# Patient Record
Sex: Female | Born: 1940
Health system: Southern US, Community
[De-identification: ages and names within clinical notes are randomized; demographics above are authoritative.]

## PROBLEM LIST (undated history)

## (undated) DIAGNOSIS — Z5189 Encounter for other specified aftercare: Secondary | ICD-10-CM

## (undated) DIAGNOSIS — T148XXA Other injury of unspecified body region, initial encounter: Secondary | ICD-10-CM

## (undated) DIAGNOSIS — I00 Rheumatic fever without heart involvement: Secondary | ICD-10-CM

## (undated) DIAGNOSIS — M545 Low back pain, unspecified: Secondary | ICD-10-CM

## (undated) DIAGNOSIS — I4891 Unspecified atrial fibrillation: Secondary | ICD-10-CM

## (undated) DIAGNOSIS — H259 Unspecified age-related cataract: Secondary | ICD-10-CM

## (undated) DIAGNOSIS — Z7901 Long term (current) use of anticoagulants: Secondary | ICD-10-CM

## (undated) DIAGNOSIS — I639 Cerebral infarction, unspecified: Secondary | ICD-10-CM

## (undated) DIAGNOSIS — K1370 Unspecified lesions of oral mucosa: Secondary | ICD-10-CM

## (undated) DIAGNOSIS — M79606 Pain in leg, unspecified: Secondary | ICD-10-CM

## (undated) DIAGNOSIS — K219 Gastro-esophageal reflux disease without esophagitis: Secondary | ICD-10-CM

## (undated) DIAGNOSIS — IMO0001 Reserved for inherently not codable concepts without codable children: Secondary | ICD-10-CM

## (undated) DIAGNOSIS — R202 Paresthesia of skin: Secondary | ICD-10-CM

## (undated) DIAGNOSIS — K5790 Diverticulosis of intestine, part unspecified, without perforation or abscess without bleeding: Secondary | ICD-10-CM

## (undated) DIAGNOSIS — I509 Heart failure, unspecified: Secondary | ICD-10-CM

## (undated) DIAGNOSIS — Z952 Presence of prosthetic heart valve: Secondary | ICD-10-CM

## (undated) DIAGNOSIS — I10 Essential (primary) hypertension: Secondary | ICD-10-CM

## (undated) HISTORY — DX: Cerebral infarction, unspecified: I63.9

## (undated) HISTORY — DX: Unspecified lesions of oral mucosa: K13.70

## (undated) HISTORY — DX: Long term (current) use of anticoagulants: Z79.01

## (undated) HISTORY — DX: Low back pain, unspecified: M54.50

## (undated) HISTORY — DX: Other injury of unspecified body region, initial encounter: T14.8XXA

## (undated) HISTORY — DX: Pain in leg, unspecified: M79.606

## (undated) HISTORY — DX: Paresthesia of skin: R20.2

## (undated) HISTORY — PX: ABDOMINAL HYSTERECTOMY: SHX81

## (undated) HISTORY — DX: Diverticulosis of intestine, part unspecified, without perforation or abscess without bleeding: K57.90

## (undated) HISTORY — DX: Essential (primary) hypertension: I10

## (undated) HISTORY — DX: Encounter for other specified aftercare: Z51.89

## (undated) HISTORY — PX: CHOLECYSTECTOMY: SHX55

## (undated) HISTORY — DX: Unspecified atrial fibrillation: I48.91

## (undated) HISTORY — DX: Low back pain: M54.5

## (undated) HISTORY — DX: Rheumatic fever without heart involvement: I00

## (undated) HISTORY — DX: Unspecified age-related cataract: H25.9

## (undated) HISTORY — DX: Gastro-esophageal reflux disease without esophagitis: K21.9

## (undated) HISTORY — DX: Presence of prosthetic heart valve: Z95.2

## (undated) HISTORY — DX: Reserved for inherently not codable concepts without codable children: IMO0001

## (undated) HISTORY — DX: Heart failure, unspecified: I50.9

---

## 1993-10-28 HISTORY — PX: CARDIAC VALVE REPLACEMENT: SHX585

## 2000-05-29 ENCOUNTER — Ambulatory Visit (HOSPITAL_COMMUNITY): Admission: RE | Admit: 2000-05-29 | Discharge: 2000-05-29 | Payer: Self-pay | Admitting: Internal Medicine

## 2000-05-29 ENCOUNTER — Encounter: Payer: Self-pay | Admitting: Internal Medicine

## 2000-11-19 ENCOUNTER — Encounter: Payer: Self-pay | Admitting: Emergency Medicine

## 2000-11-19 ENCOUNTER — Emergency Department (HOSPITAL_COMMUNITY): Admission: EM | Admit: 2000-11-19 | Discharge: 2000-11-19 | Payer: Self-pay

## 2001-01-01 ENCOUNTER — Encounter: Payer: Self-pay | Admitting: Internal Medicine

## 2001-01-01 ENCOUNTER — Inpatient Hospital Stay (HOSPITAL_COMMUNITY): Admission: EM | Admit: 2001-01-01 | Discharge: 2001-01-02 | Payer: Self-pay | Admitting: Internal Medicine

## 2001-02-05 ENCOUNTER — Ambulatory Visit (HOSPITAL_BASED_OUTPATIENT_CLINIC_OR_DEPARTMENT_OTHER): Admission: RE | Admit: 2001-02-05 | Discharge: 2001-02-05 | Payer: Self-pay | Admitting: Critical Care Medicine

## 2001-03-03 ENCOUNTER — Ambulatory Visit (HOSPITAL_BASED_OUTPATIENT_CLINIC_OR_DEPARTMENT_OTHER): Admission: RE | Admit: 2001-03-03 | Discharge: 2001-03-03 | Payer: Self-pay | Admitting: Critical Care Medicine

## 2001-08-04 ENCOUNTER — Encounter: Payer: Self-pay | Admitting: Obstetrics

## 2001-08-04 ENCOUNTER — Encounter: Admission: RE | Admit: 2001-08-04 | Discharge: 2001-08-04 | Payer: Self-pay | Admitting: Obstetrics

## 2003-07-20 ENCOUNTER — Ambulatory Visit (HOSPITAL_COMMUNITY): Admission: RE | Admit: 2003-07-20 | Discharge: 2003-07-20 | Payer: Self-pay | Admitting: Obstetrics

## 2003-07-20 ENCOUNTER — Encounter: Payer: Self-pay | Admitting: Obstetrics

## 2004-04-29 ENCOUNTER — Emergency Department (HOSPITAL_COMMUNITY): Admission: EM | Admit: 2004-04-29 | Discharge: 2004-04-29 | Payer: Self-pay | Admitting: Emergency Medicine

## 2004-07-31 ENCOUNTER — Ambulatory Visit (HOSPITAL_BASED_OUTPATIENT_CLINIC_OR_DEPARTMENT_OTHER): Admission: RE | Admit: 2004-07-31 | Discharge: 2004-07-31 | Payer: Self-pay | Admitting: Otolaryngology

## 2004-10-04 ENCOUNTER — Ambulatory Visit: Payer: Self-pay | Admitting: Internal Medicine

## 2004-10-16 ENCOUNTER — Ambulatory Visit: Payer: Self-pay | Admitting: Family Medicine

## 2004-10-17 ENCOUNTER — Ambulatory Visit: Payer: Self-pay | Admitting: Family Medicine

## 2004-10-31 ENCOUNTER — Ambulatory Visit: Payer: Self-pay | Admitting: Family Medicine

## 2004-11-16 ENCOUNTER — Ambulatory Visit: Payer: Self-pay | Admitting: Family Medicine

## 2004-11-26 ENCOUNTER — Ambulatory Visit: Payer: Self-pay | Admitting: Family Medicine

## 2004-12-10 ENCOUNTER — Ambulatory Visit: Payer: Self-pay | Admitting: Family Medicine

## 2004-12-24 ENCOUNTER — Ambulatory Visit: Payer: Self-pay | Admitting: Family Medicine

## 2005-01-01 ENCOUNTER — Ambulatory Visit: Payer: Self-pay | Admitting: Family Medicine

## 2005-01-15 ENCOUNTER — Ambulatory Visit: Payer: Self-pay | Admitting: Family Medicine

## 2005-01-21 ENCOUNTER — Ambulatory Visit: Payer: Self-pay | Admitting: Cardiovascular Disease

## 2005-01-28 ENCOUNTER — Ambulatory Visit: Payer: Self-pay | Admitting: Family Medicine

## 2005-02-18 ENCOUNTER — Ambulatory Visit: Payer: Self-pay | Admitting: Family Medicine

## 2005-02-26 ENCOUNTER — Ambulatory Visit: Payer: Self-pay | Admitting: Family Medicine

## 2005-03-12 ENCOUNTER — Ambulatory Visit: Payer: Self-pay | Admitting: Family Medicine

## 2005-03-27 ENCOUNTER — Ambulatory Visit: Payer: Self-pay | Admitting: Family Medicine

## 2005-04-24 ENCOUNTER — Ambulatory Visit: Payer: Self-pay | Admitting: Family Medicine

## 2005-05-09 ENCOUNTER — Ambulatory Visit: Payer: Self-pay | Admitting: Family Medicine

## 2005-05-23 ENCOUNTER — Ambulatory Visit: Payer: Self-pay | Admitting: Family Medicine

## 2005-05-31 ENCOUNTER — Ambulatory Visit: Payer: Self-pay | Admitting: Family Medicine

## 2005-06-06 ENCOUNTER — Ambulatory Visit: Payer: Self-pay | Admitting: Family Medicine

## 2005-06-19 ENCOUNTER — Ambulatory Visit: Payer: Self-pay | Admitting: Family Medicine

## 2005-07-03 ENCOUNTER — Ambulatory Visit: Payer: Self-pay | Admitting: Family Medicine

## 2005-07-18 ENCOUNTER — Ambulatory Visit: Payer: Self-pay | Admitting: Family Medicine

## 2005-07-31 ENCOUNTER — Ambulatory Visit: Payer: Self-pay | Admitting: Family Medicine

## 2005-08-14 ENCOUNTER — Ambulatory Visit: Payer: Self-pay | Admitting: Family Medicine

## 2005-08-29 ENCOUNTER — Ambulatory Visit: Payer: Self-pay | Admitting: Family Medicine

## 2005-09-02 ENCOUNTER — Ambulatory Visit: Payer: Self-pay | Admitting: Family Medicine

## 2005-09-26 ENCOUNTER — Ambulatory Visit: Payer: Self-pay | Admitting: Family Medicine

## 2005-10-24 ENCOUNTER — Ambulatory Visit: Payer: Self-pay | Admitting: Family Medicine

## 2005-11-21 ENCOUNTER — Ambulatory Visit: Payer: Self-pay | Admitting: Family Medicine

## 2005-12-05 ENCOUNTER — Ambulatory Visit: Payer: Self-pay | Admitting: Family Medicine

## 2005-12-19 ENCOUNTER — Ambulatory Visit: Payer: Self-pay | Admitting: Family Medicine

## 2006-01-20 ENCOUNTER — Ambulatory Visit: Payer: Self-pay | Admitting: Family Medicine

## 2006-01-28 ENCOUNTER — Ambulatory Visit: Payer: Self-pay | Admitting: Family Medicine

## 2006-02-17 ENCOUNTER — Ambulatory Visit: Payer: Self-pay | Admitting: Family Medicine

## 2006-02-21 ENCOUNTER — Ambulatory Visit: Payer: Self-pay | Admitting: Family Medicine

## 2006-02-24 ENCOUNTER — Ambulatory Visit: Payer: Self-pay | Admitting: Family Medicine

## 2006-03-19 ENCOUNTER — Ambulatory Visit: Payer: Self-pay | Admitting: Family Medicine

## 2006-03-27 ENCOUNTER — Ambulatory Visit: Payer: Self-pay | Admitting: Family Medicine

## 2006-04-07 ENCOUNTER — Ambulatory Visit: Payer: Self-pay | Admitting: Family Medicine

## 2006-04-14 ENCOUNTER — Ambulatory Visit: Payer: Self-pay | Admitting: Family Medicine

## 2006-04-29 ENCOUNTER — Ambulatory Visit: Payer: Self-pay | Admitting: Family Medicine

## 2006-05-21 ENCOUNTER — Ambulatory Visit: Payer: Self-pay | Admitting: Family Medicine

## 2006-06-18 ENCOUNTER — Ambulatory Visit: Payer: Self-pay | Admitting: Family Medicine

## 2006-06-20 ENCOUNTER — Ambulatory Visit: Payer: Self-pay | Admitting: Family Medicine

## 2006-06-26 ENCOUNTER — Ambulatory Visit: Payer: Self-pay | Admitting: Family Medicine

## 2006-07-09 ENCOUNTER — Ambulatory Visit: Payer: Self-pay | Admitting: Family Medicine

## 2006-09-10 ENCOUNTER — Ambulatory Visit: Payer: Self-pay | Admitting: Family Medicine

## 2006-09-22 ENCOUNTER — Ambulatory Visit: Payer: Self-pay | Admitting: Cardiovascular Disease

## 2006-10-03 ENCOUNTER — Ambulatory Visit: Payer: Self-pay | Admitting: Internal Medicine

## 2006-10-03 LAB — CONVERTED CEMR LAB
Bacteria, U Microscopic: NEGATIVE /hpf
Basophils Relative: 0.5 % (ref 0.0–1.0)
Bilirubin Urine: NEGATIVE
Eosinophil percent: 1.2 % (ref 0.0–5.0)
Glucose, Bld: 110 mg/dL — ABNORMAL HIGH (ref 70–99)
Hemoglobin: 13 g/dL (ref 12.0–15.0)
Lymphocytes Relative: 28.6 % (ref 12.0–46.0)
Monocytes Absolute: 0.7 10*3/uL (ref 0.2–0.7)
Monocytes Relative: 10.8 % (ref 3.0–11.0)
Neutro Abs: 3.6 10*3/uL (ref 1.4–7.7)
Nitrite: NEGATIVE
Potassium: 4.3 meq/L (ref 3.5–5.1)
RDW: 13.7 % (ref 11.5–14.6)
Sodium: 139 meq/L (ref 135–145)
Specific Gravity, Urine: 1.01 (ref 1.000–1.03)
TSH: 3.15 microintl units/mL (ref 0.35–5.50)
Urine Glucose: NEGATIVE mg/dL
WBC: 6.2 10*3/uL (ref 4.5–10.5)
pH: 5.5 (ref 5.0–8.0)

## 2006-10-24 ENCOUNTER — Ambulatory Visit: Payer: Self-pay

## 2006-10-24 ENCOUNTER — Ambulatory Visit: Payer: Self-pay | Admitting: Cardiology

## 2006-11-25 ENCOUNTER — Ambulatory Visit: Payer: Self-pay | Admitting: Cardiology

## 2006-12-22 ENCOUNTER — Ambulatory Visit: Payer: Self-pay | Admitting: Cardiology

## 2007-01-19 ENCOUNTER — Ambulatory Visit: Payer: Self-pay | Admitting: Cardiology

## 2007-01-26 ENCOUNTER — Emergency Department (HOSPITAL_COMMUNITY): Admission: EM | Admit: 2007-01-26 | Discharge: 2007-01-26 | Payer: Self-pay | Admitting: Emergency Medicine

## 2007-03-02 ENCOUNTER — Ambulatory Visit: Payer: Self-pay | Admitting: Cardiovascular Disease

## 2007-03-02 LAB — CONVERTED CEMR LAB: INR: 2.6 — ABNORMAL HIGH (ref 0.9–2.0)

## 2007-04-01 ENCOUNTER — Ambulatory Visit: Payer: Self-pay | Admitting: Cardiology

## 2007-04-06 ENCOUNTER — Ambulatory Visit: Payer: Self-pay | Admitting: Internal Medicine

## 2007-04-27 ENCOUNTER — Ambulatory Visit: Payer: Self-pay | Admitting: Internal Medicine

## 2007-04-28 LAB — CONVERTED CEMR LAB
Albumin: 3.5 g/dL (ref 3.5–5.2)
Alkaline Phosphatase: 73 units/L (ref 39–117)
BUN: 10 mg/dL (ref 6–23)
Basophils Absolute: 0.1 10*3/uL (ref 0.0–0.1)
Basophils Relative: 1.4 % — ABNORMAL HIGH (ref 0.0–1.0)
Creatinine, Ser: 0.9 mg/dL (ref 0.4–1.2)
Crystals: NEGATIVE
GFR calc Af Amer: 80 mL/min
GFR calc non Af Amer: 66 mL/min
Hgb A1c MFr Bld: 6.8 % — ABNORMAL HIGH (ref 4.6–6.0)
INR: 6.1 (ref 0.9–2.0)
LDL Cholesterol: 59 mg/dL (ref 0–99)
Monocytes Relative: 12.7 % — ABNORMAL HIGH (ref 3.0–11.0)
Neutro Abs: 2.2 10*3/uL (ref 1.4–7.7)
Platelets: 306 10*3/uL (ref 150–400)
Potassium: 4.5 meq/L (ref 3.5–5.1)
Prothrombin Time: 32.2 s (ref 10.0–14.0)
RDW: 14.2 % (ref 11.5–14.6)
Sed Rate: 19 mm/hr (ref 0–25)
Sodium: 142 meq/L (ref 135–145)
Specific Gravity, Urine: 1.025 (ref 1.000–1.03)
TSH: 2.61 microintl units/mL (ref 0.35–5.50)
Total Bilirubin: 0.6 mg/dL (ref 0.3–1.2)
Total CHOL/HDL Ratio: 3.6
Urine Glucose: NEGATIVE mg/dL
Urobilinogen, UA: 0.2 (ref 0.0–1.0)
Vitamin B-12: 499 pg/mL (ref 211–911)
pH: 5.5 (ref 5.0–8.0)

## 2007-05-07 ENCOUNTER — Ambulatory Visit: Payer: Self-pay | Admitting: Internal Medicine

## 2007-05-21 ENCOUNTER — Ambulatory Visit (HOSPITAL_COMMUNITY): Admission: RE | Admit: 2007-05-21 | Discharge: 2007-05-21 | Payer: Self-pay | Admitting: Obstetrics

## 2007-07-02 ENCOUNTER — Ambulatory Visit: Payer: Self-pay | Admitting: Internal Medicine

## 2007-07-02 LAB — CONVERTED CEMR LAB
INR: 2.5 — ABNORMAL HIGH (ref 0.8–1.0)
Prothrombin Time: 20 s — ABNORMAL HIGH (ref 10.9–13.3)

## 2007-07-08 ENCOUNTER — Ambulatory Visit: Payer: Self-pay | Admitting: Internal Medicine

## 2007-07-31 ENCOUNTER — Ambulatory Visit: Payer: Self-pay | Admitting: Internal Medicine

## 2007-09-10 ENCOUNTER — Ambulatory Visit: Payer: Self-pay | Admitting: Internal Medicine

## 2007-10-05 ENCOUNTER — Ambulatory Visit: Payer: Self-pay | Admitting: Cardiovascular Disease

## 2007-10-05 ENCOUNTER — Ambulatory Visit: Payer: Self-pay | Admitting: Internal Medicine

## 2007-10-12 ENCOUNTER — Encounter: Payer: Self-pay | Admitting: Cardiovascular Disease

## 2007-10-12 ENCOUNTER — Ambulatory Visit: Payer: Self-pay

## 2007-10-12 ENCOUNTER — Ambulatory Visit: Payer: Self-pay | Admitting: Internal Medicine

## 2007-10-14 ENCOUNTER — Ambulatory Visit: Payer: Self-pay | Admitting: Internal Medicine

## 2007-10-14 DIAGNOSIS — E559 Vitamin D deficiency, unspecified: Secondary | ICD-10-CM | POA: Insufficient documentation

## 2007-10-14 DIAGNOSIS — I4891 Unspecified atrial fibrillation: Secondary | ICD-10-CM | POA: Insufficient documentation

## 2007-10-14 DIAGNOSIS — E118 Type 2 diabetes mellitus with unspecified complications: Secondary | ICD-10-CM | POA: Insufficient documentation

## 2007-10-14 DIAGNOSIS — M545 Low back pain, unspecified: Secondary | ICD-10-CM | POA: Insufficient documentation

## 2007-10-14 DIAGNOSIS — K219 Gastro-esophageal reflux disease without esophagitis: Secondary | ICD-10-CM | POA: Insufficient documentation

## 2007-10-14 DIAGNOSIS — E119 Type 2 diabetes mellitus without complications: Secondary | ICD-10-CM | POA: Insufficient documentation

## 2007-10-15 ENCOUNTER — Encounter: Payer: Self-pay | Admitting: Internal Medicine

## 2007-10-30 ENCOUNTER — Telehealth: Payer: Self-pay | Admitting: Family Medicine

## 2007-11-02 ENCOUNTER — Ambulatory Visit: Payer: Self-pay | Admitting: Cardiology

## 2007-11-23 ENCOUNTER — Ambulatory Visit: Payer: Self-pay | Admitting: Cardiology

## 2007-12-07 ENCOUNTER — Ambulatory Visit: Payer: Self-pay | Admitting: Cardiology

## 2007-12-16 ENCOUNTER — Encounter: Payer: Self-pay | Admitting: Internal Medicine

## 2007-12-31 ENCOUNTER — Ambulatory Visit: Payer: Self-pay | Admitting: Cardiovascular Disease

## 2008-01-19 ENCOUNTER — Ambulatory Visit: Payer: Self-pay | Admitting: Cardiology

## 2008-02-15 ENCOUNTER — Ambulatory Visit: Payer: Self-pay | Admitting: Internal Medicine

## 2008-02-15 DIAGNOSIS — J309 Allergic rhinitis, unspecified: Secondary | ICD-10-CM | POA: Insufficient documentation

## 2008-02-15 DIAGNOSIS — R61 Generalized hyperhidrosis: Secondary | ICD-10-CM | POA: Insufficient documentation

## 2008-02-15 LAB — CONVERTED CEMR LAB
BUN: 18 mg/dL (ref 6–23)
Chloride: 106 meq/L (ref 96–112)
GFR calc Af Amer: 71 mL/min
Glucose, Bld: 147 mg/dL — ABNORMAL HIGH (ref 70–99)
Potassium: 4 meq/L (ref 3.5–5.1)
Sodium: 139 meq/L (ref 135–145)
Vit D, 1,25-Dihydroxy: 33 (ref 30–89)

## 2008-03-14 ENCOUNTER — Ambulatory Visit: Payer: Self-pay | Admitting: Cardiology

## 2008-04-12 ENCOUNTER — Ambulatory Visit: Payer: Self-pay | Admitting: Cardiology

## 2008-04-20 ENCOUNTER — Ambulatory Visit: Payer: Self-pay | Admitting: Internal Medicine

## 2008-04-25 ENCOUNTER — Ambulatory Visit: Payer: Self-pay | Admitting: Cardiology

## 2008-04-25 ENCOUNTER — Ambulatory Visit: Payer: Self-pay | Admitting: Cardiovascular Disease

## 2008-05-16 ENCOUNTER — Encounter: Payer: Self-pay | Admitting: Internal Medicine

## 2008-06-01 ENCOUNTER — Ambulatory Visit: Payer: Self-pay | Admitting: Cardiology

## 2008-06-28 ENCOUNTER — Ambulatory Visit: Payer: Self-pay | Admitting: Internal Medicine

## 2008-06-28 DIAGNOSIS — K1370 Unspecified lesions of oral mucosa: Secondary | ICD-10-CM | POA: Insufficient documentation

## 2008-06-28 DIAGNOSIS — M79609 Pain in unspecified limb: Secondary | ICD-10-CM | POA: Insufficient documentation

## 2008-06-28 DIAGNOSIS — H259 Unspecified age-related cataract: Secondary | ICD-10-CM | POA: Insufficient documentation

## 2008-06-29 ENCOUNTER — Ambulatory Visit: Payer: Self-pay | Admitting: Internal Medicine

## 2008-07-20 ENCOUNTER — Ambulatory Visit: Payer: Self-pay | Admitting: Cardiology

## 2008-07-28 ENCOUNTER — Ambulatory Visit: Payer: Self-pay | Admitting: Internal Medicine

## 2008-08-11 ENCOUNTER — Ambulatory Visit: Payer: Self-pay | Admitting: Internal Medicine

## 2008-08-22 ENCOUNTER — Ambulatory Visit: Payer: Self-pay | Admitting: Cardiology

## 2008-09-05 ENCOUNTER — Ambulatory Visit: Payer: Self-pay | Admitting: Cardiovascular Disease

## 2008-09-19 ENCOUNTER — Ambulatory Visit: Payer: Self-pay | Admitting: Internal Medicine

## 2008-10-03 ENCOUNTER — Ambulatory Visit: Payer: Self-pay | Admitting: Internal Medicine

## 2008-10-10 ENCOUNTER — Ambulatory Visit: Payer: Self-pay | Admitting: Internal Medicine

## 2008-10-10 LAB — CONVERTED CEMR LAB
INR: 5.5 (ref 0.8–1.0)
Prothrombin Time: 54 s (ref 10.9–13.3)

## 2008-10-13 ENCOUNTER — Ambulatory Visit: Payer: Self-pay | Admitting: Cardiovascular Disease

## 2008-10-26 ENCOUNTER — Ambulatory Visit: Payer: Self-pay | Admitting: Internal Medicine

## 2008-11-07 ENCOUNTER — Ambulatory Visit: Payer: Self-pay | Admitting: Cardiology

## 2008-11-30 ENCOUNTER — Ambulatory Visit: Payer: Self-pay | Admitting: Cardiology

## 2008-12-13 ENCOUNTER — Ambulatory Visit: Payer: Self-pay | Admitting: Cardiology

## 2009-01-04 ENCOUNTER — Ambulatory Visit: Payer: Self-pay | Admitting: Cardiology

## 2009-01-18 ENCOUNTER — Ambulatory Visit: Payer: Self-pay | Admitting: Cardiology

## 2009-01-30 ENCOUNTER — Ambulatory Visit: Payer: Self-pay | Admitting: Internal Medicine

## 2009-01-30 LAB — CONVERTED CEMR LAB
ALT: 20 units/L (ref 0–35)
AST: 27 units/L (ref 0–37)
BUN: 11 mg/dL (ref 6–23)
Bilirubin, Direct: 0.1 mg/dL (ref 0.0–0.3)
Chloride: 108 meq/L (ref 96–112)
Cholesterol: 120 mg/dL (ref 0–200)
Creatinine, Ser: 0.9 mg/dL (ref 0.4–1.2)
GFR calc non Af Amer: 79.73 mL/min (ref >60)
LDL Cholesterol: 64 mg/dL (ref 0–99)
Potassium: 4.2 meq/L (ref 3.5–5.1)
Total Bilirubin: 1 mg/dL (ref 0.3–1.2)
Total CHOL/HDL Ratio: 3
Triglycerides: 89 mg/dL (ref 0.0–149.0)
VLDL: 17.8 mg/dL (ref 0.0–40.0)

## 2009-02-02 ENCOUNTER — Ambulatory Visit: Payer: Self-pay | Admitting: Internal Medicine

## 2009-02-02 DIAGNOSIS — R209 Unspecified disturbances of skin sensation: Secondary | ICD-10-CM | POA: Insufficient documentation

## 2009-02-10 DIAGNOSIS — K5730 Diverticulosis of large intestine without perforation or abscess without bleeding: Secondary | ICD-10-CM | POA: Insufficient documentation

## 2009-02-15 ENCOUNTER — Ambulatory Visit: Payer: Self-pay | Admitting: Internal Medicine

## 2009-02-17 LAB — CONVERTED CEMR LAB: INR: 1.8 — ABNORMAL HIGH (ref 0.8–1.0)

## 2009-02-22 ENCOUNTER — Telehealth: Payer: Self-pay | Admitting: Internal Medicine

## 2009-03-06 ENCOUNTER — Encounter: Payer: Self-pay | Admitting: Internal Medicine

## 2009-03-17 ENCOUNTER — Ambulatory Visit: Payer: Self-pay | Admitting: Internal Medicine

## 2009-03-28 ENCOUNTER — Encounter: Payer: Self-pay | Admitting: *Deleted

## 2009-04-17 ENCOUNTER — Encounter: Payer: Self-pay | Admitting: Internal Medicine

## 2009-04-17 LAB — CONVERTED CEMR LAB: Prothrombin Time: 11.1 s (ref 10.9–13.3)

## 2009-04-18 ENCOUNTER — Ambulatory Visit: Payer: Self-pay | Admitting: Internal Medicine

## 2009-04-19 LAB — CONVERTED CEMR LAB
INR: 2.5 — ABNORMAL HIGH (ref 0.8–1.0)
Prothrombin Time: 26 s — ABNORMAL HIGH (ref 10.9–13.3)

## 2009-04-26 ENCOUNTER — Ambulatory Visit: Payer: Self-pay | Admitting: Internal Medicine

## 2009-04-27 DIAGNOSIS — I10 Essential (primary) hypertension: Secondary | ICD-10-CM | POA: Insufficient documentation

## 2009-04-27 DIAGNOSIS — T148XXA Other injury of unspecified body region, initial encounter: Secondary | ICD-10-CM | POA: Insufficient documentation

## 2009-04-27 DIAGNOSIS — R079 Chest pain, unspecified: Secondary | ICD-10-CM | POA: Insufficient documentation

## 2009-04-28 ENCOUNTER — Ambulatory Visit: Payer: Self-pay | Admitting: Cardiovascular Disease

## 2009-04-28 DIAGNOSIS — I6529 Occlusion and stenosis of unspecified carotid artery: Secondary | ICD-10-CM | POA: Insufficient documentation

## 2009-04-28 DIAGNOSIS — Z9889 Other specified postprocedural states: Secondary | ICD-10-CM | POA: Insufficient documentation

## 2009-04-28 DIAGNOSIS — I359 Nonrheumatic aortic valve disorder, unspecified: Secondary | ICD-10-CM | POA: Insufficient documentation

## 2009-05-03 ENCOUNTER — Encounter: Payer: Self-pay | Admitting: *Deleted

## 2009-05-19 ENCOUNTER — Emergency Department (HOSPITAL_COMMUNITY): Admission: EM | Admit: 2009-05-19 | Discharge: 2009-05-19 | Payer: Self-pay | Admitting: Emergency Medicine

## 2009-05-29 ENCOUNTER — Ambulatory Visit: Payer: Self-pay | Admitting: Internal Medicine

## 2009-05-30 ENCOUNTER — Telehealth (INDEPENDENT_AMBULATORY_CARE_PROVIDER_SITE_OTHER): Payer: Self-pay | Admitting: *Deleted

## 2009-06-01 LAB — CONVERTED CEMR LAB: Prothrombin Time: 15.3 s — ABNORMAL HIGH (ref 10.9–13.3)

## 2009-06-09 ENCOUNTER — Encounter: Payer: Self-pay | Admitting: Cardiology

## 2009-06-13 ENCOUNTER — Encounter (INDEPENDENT_AMBULATORY_CARE_PROVIDER_SITE_OTHER): Payer: Self-pay | Admitting: *Deleted

## 2009-06-15 ENCOUNTER — Ambulatory Visit: Payer: Self-pay | Admitting: Internal Medicine

## 2009-07-07 ENCOUNTER — Encounter: Payer: Self-pay | Admitting: Internal Medicine

## 2009-08-01 ENCOUNTER — Emergency Department (HOSPITAL_COMMUNITY): Admission: EM | Admit: 2009-08-01 | Discharge: 2009-08-01 | Payer: Self-pay | Admitting: Emergency Medicine

## 2009-08-03 ENCOUNTER — Ambulatory Visit: Payer: Self-pay | Admitting: Internal Medicine

## 2009-08-08 LAB — CONVERTED CEMR LAB: Hgb A1c MFr Bld: 6.1 % (ref 4.6–6.5)

## 2009-08-18 ENCOUNTER — Telehealth: Payer: Self-pay | Admitting: Internal Medicine

## 2009-09-28 ENCOUNTER — Ambulatory Visit: Payer: Self-pay | Admitting: Internal Medicine

## 2009-09-28 DIAGNOSIS — Z87891 Personal history of nicotine dependence: Secondary | ICD-10-CM | POA: Insufficient documentation

## 2009-10-03 LAB — CONVERTED CEMR LAB
ALT: 29 units/L (ref 0–35)
AST: 32 units/L (ref 0–37)
Alkaline Phosphatase: 76 units/L (ref 39–117)
Bilirubin, Direct: 0.1 mg/dL (ref 0.0–0.3)
CO2: 29 meq/L (ref 19–32)
Chloride: 104 meq/L (ref 96–112)
Hgb A1c MFr Bld: 6.3 % (ref 4.6–6.5)
LDL Cholesterol: 57 mg/dL (ref 0–99)
Leukocytes, UA: NEGATIVE
Potassium: 4 meq/L (ref 3.5–5.1)
Sodium: 139 meq/L (ref 135–145)
TSH: 2.4 microintl units/mL (ref 0.35–5.50)
Total Bilirubin: 0.7 mg/dL (ref 0.3–1.2)
Total CHOL/HDL Ratio: 3
Triglycerides: 126 mg/dL (ref 0.0–149.0)
Urine Glucose: NEGATIVE mg/dL
Urobilinogen, UA: 0.2 (ref 0.0–1.0)

## 2009-10-31 ENCOUNTER — Encounter (INDEPENDENT_AMBULATORY_CARE_PROVIDER_SITE_OTHER): Payer: Self-pay | Admitting: *Deleted

## 2009-11-29 ENCOUNTER — Encounter: Payer: Self-pay | Admitting: Cardiovascular Disease

## 2009-11-30 ENCOUNTER — Ambulatory Visit: Payer: Self-pay

## 2009-11-30 ENCOUNTER — Ambulatory Visit (HOSPITAL_COMMUNITY): Admission: RE | Admit: 2009-11-30 | Discharge: 2009-11-30 | Payer: Self-pay | Admitting: Cardiovascular Disease

## 2009-11-30 ENCOUNTER — Ambulatory Visit: Payer: Self-pay | Admitting: Cardiovascular Disease

## 2009-11-30 ENCOUNTER — Ambulatory Visit: Payer: Self-pay | Admitting: Cardiology

## 2009-11-30 ENCOUNTER — Encounter: Payer: Self-pay | Admitting: Cardiovascular Disease

## 2009-11-30 LAB — CONVERTED CEMR LAB: POC INR: 2.1

## 2009-12-11 ENCOUNTER — Ambulatory Visit: Payer: Self-pay | Admitting: Internal Medicine

## 2009-12-16 ENCOUNTER — Ambulatory Visit: Payer: Self-pay | Admitting: Family Medicine

## 2009-12-16 DIAGNOSIS — N390 Urinary tract infection, site not specified: Secondary | ICD-10-CM | POA: Insufficient documentation

## 2009-12-16 DIAGNOSIS — K5900 Constipation, unspecified: Secondary | ICD-10-CM | POA: Insufficient documentation

## 2009-12-16 DIAGNOSIS — J069 Acute upper respiratory infection, unspecified: Secondary | ICD-10-CM | POA: Insufficient documentation

## 2009-12-25 ENCOUNTER — Ambulatory Visit: Payer: Self-pay | Admitting: Internal Medicine

## 2009-12-25 LAB — CONVERTED CEMR LAB: POC INR: 2.7

## 2010-01-08 ENCOUNTER — Ambulatory Visit: Payer: Self-pay | Admitting: Internal Medicine

## 2010-01-29 ENCOUNTER — Ambulatory Visit: Payer: Self-pay | Admitting: Internal Medicine

## 2010-01-29 LAB — CONVERTED CEMR LAB: POC INR: 2.9

## 2010-01-31 ENCOUNTER — Ambulatory Visit: Payer: Self-pay | Admitting: Internal Medicine

## 2010-01-31 DIAGNOSIS — G459 Transient cerebral ischemic attack, unspecified: Secondary | ICD-10-CM | POA: Insufficient documentation

## 2010-01-31 LAB — CONVERTED CEMR LAB
BUN: 11 mg/dL (ref 6–23)
Basophils Relative: 1 % (ref 0.0–3.0)
Creatinine, Ser: 0.9 mg/dL (ref 0.4–1.2)
Eosinophils Relative: 1.5 % (ref 0.0–5.0)
GFR calc non Af Amer: 79.5 mL/min (ref >60)
HCT: 37.9 % (ref 36.0–46.0)
Hgb A1c MFr Bld: 6.4 % (ref 4.6–6.5)
Lymphs Abs: 1.5 10*3/uL (ref 0.7–4.0)
MCV: 82.5 fL (ref 78.0–100.0)
Monocytes Absolute: 0.7 10*3/uL (ref 0.1–1.0)
Neutro Abs: 2.7 10*3/uL (ref 1.4–7.7)
Potassium: 4.3 meq/L (ref 3.5–5.1)
RBC: 4.6 M/uL (ref 3.87–5.11)
Total Bilirubin: 0.3 mg/dL (ref 0.3–1.2)
Vitamin B-12: 1233 pg/mL — ABNORMAL HIGH (ref 211–911)
WBC: 5 10*3/uL (ref 4.5–10.5)

## 2010-02-02 ENCOUNTER — Telehealth: Payer: Self-pay | Admitting: Internal Medicine

## 2010-02-02 ENCOUNTER — Ambulatory Visit: Payer: Self-pay | Admitting: Cardiology

## 2010-02-12 ENCOUNTER — Ambulatory Visit: Payer: Self-pay | Admitting: Cardiology

## 2010-02-16 ENCOUNTER — Encounter: Payer: Self-pay | Admitting: Internal Medicine

## 2010-02-26 ENCOUNTER — Ambulatory Visit: Payer: Self-pay | Admitting: Cardiology

## 2010-03-13 ENCOUNTER — Ambulatory Visit: Payer: Self-pay | Admitting: Internal Medicine

## 2010-03-13 DIAGNOSIS — IMO0002 Reserved for concepts with insufficient information to code with codable children: Secondary | ICD-10-CM | POA: Insufficient documentation

## 2010-04-12 ENCOUNTER — Encounter: Payer: Self-pay | Admitting: Internal Medicine

## 2010-05-09 ENCOUNTER — Ambulatory Visit: Payer: Self-pay | Admitting: Internal Medicine

## 2010-05-09 LAB — CONVERTED CEMR LAB: POC INR: 2.4

## 2010-05-14 ENCOUNTER — Ambulatory Visit: Payer: Self-pay | Admitting: Internal Medicine

## 2010-05-17 ENCOUNTER — Encounter: Payer: Self-pay | Admitting: Internal Medicine

## 2010-05-21 ENCOUNTER — Ambulatory Visit: Payer: Self-pay | Admitting: Internal Medicine

## 2010-05-21 DIAGNOSIS — M6530 Trigger finger, unspecified finger: Secondary | ICD-10-CM | POA: Insufficient documentation

## 2010-05-22 LAB — CONVERTED CEMR LAB
CO2: 30 meq/L (ref 19–32)
Calcium: 9.7 mg/dL (ref 8.4–10.5)
Chloride: 103 meq/L (ref 96–112)
Sodium: 139 meq/L (ref 135–145)
TSH: 1.59 microintl units/mL (ref 0.35–5.50)

## 2010-05-28 ENCOUNTER — Ambulatory Visit: Payer: Self-pay | Admitting: Internal Medicine

## 2010-05-28 LAB — CONVERTED CEMR LAB: POC INR: 2.7

## 2010-06-04 ENCOUNTER — Ambulatory Visit: Payer: Self-pay | Admitting: Internal Medicine

## 2010-06-04 DIAGNOSIS — J029 Acute pharyngitis, unspecified: Secondary | ICD-10-CM | POA: Insufficient documentation

## 2010-06-18 ENCOUNTER — Ambulatory Visit: Payer: Self-pay | Admitting: Cardiology

## 2010-07-05 ENCOUNTER — Telehealth: Payer: Self-pay | Admitting: Internal Medicine

## 2010-07-05 ENCOUNTER — Ambulatory Visit: Payer: Self-pay | Admitting: Internal Medicine

## 2010-07-05 LAB — CONVERTED CEMR LAB: POC INR: 3.5

## 2010-07-25 ENCOUNTER — Ambulatory Visit: Payer: Self-pay | Admitting: Internal Medicine

## 2010-07-25 LAB — CONVERTED CEMR LAB: POC INR: 3

## 2010-08-17 ENCOUNTER — Ambulatory Visit: Payer: Self-pay | Admitting: Internal Medicine

## 2010-08-17 DIAGNOSIS — M25569 Pain in unspecified knee: Secondary | ICD-10-CM | POA: Insufficient documentation

## 2010-08-30 ENCOUNTER — Telehealth (INDEPENDENT_AMBULATORY_CARE_PROVIDER_SITE_OTHER): Payer: Self-pay | Admitting: *Deleted

## 2010-08-30 ENCOUNTER — Ambulatory Visit: Payer: Self-pay | Admitting: Cardiology

## 2010-09-04 ENCOUNTER — Ambulatory Visit: Payer: Self-pay | Admitting: Internal Medicine

## 2010-09-06 ENCOUNTER — Emergency Department (HOSPITAL_COMMUNITY): Admission: EM | Admit: 2010-09-06 | Discharge: 2010-09-06 | Payer: Self-pay | Admitting: Emergency Medicine

## 2010-09-17 ENCOUNTER — Ambulatory Visit: Payer: Self-pay | Admitting: Internal Medicine

## 2010-10-01 LAB — CONVERTED CEMR LAB
BUN: 12 mg/dL (ref 6–23)
CO2: 29 meq/L (ref 19–32)
Calcium: 9.3 mg/dL (ref 8.4–10.5)
Chloride: 105 meq/L (ref 96–112)
Creatinine, Ser: 1 mg/dL (ref 0.4–1.2)
GFR calc non Af Amer: 69.47 mL/min (ref >60)
Glucose, Bld: 114 mg/dL — ABNORMAL HIGH (ref 70–99)
Hgb A1c MFr Bld: 6.1 % (ref 4.6–6.5)
Potassium: 4.3 meq/L (ref 3.5–5.1)
Sodium: 143 meq/L (ref 135–145)

## 2010-11-21 ENCOUNTER — Ambulatory Visit
Admission: RE | Admit: 2010-11-21 | Discharge: 2010-11-21 | Payer: Self-pay | Source: Home / Self Care | Attending: Cardiology | Admitting: Cardiology

## 2010-11-21 LAB — CONVERTED CEMR LAB: POC INR: 2.5

## 2010-11-27 NOTE — Assessment & Plan Note (Signed)
Summary: 4 mth fu---stc   Vital Signs:  Patient profile:   70 year old female Height:      62 inches Weight:      181 pounds BMI:     33.22 Temp:     98.8 degrees F oral Pulse rate:   76 / minute Pulse rhythm:   irregular Resp:     16 per minute BP sitting:   130 / 82  (left arm) Cuff size:   regular  Vitals Entered By: Lanier Prude, CMA(AAMA) (September 17, 2010 9:55 AM) CC: 4 mo f/u Is Patient Diabetic? Yes Comments pt did not have colonoscopy so she never used Enoxaparin   Primary Care Provider:  Jacinta Shoe, MD  CC:  4 mo f/u.  History of Present Illness: The patient presents for a follow up of hypertension, diabetes, hyperlipidemia,  OA, MVR   Current Medications (verified): 1)  Furosemide 40 Mg Tabs (Furosemide) .... Take One Tablet Daily 2)  Warfarin Sodium 10 Mg Tabs (Warfarin Sodium) .... Use As Directed By Anticoagulation Clinic 3)  Coumadin 5 Mg Tabs (Warfarin Sodium) .... As Dirrected 4)  Ranitidine Hcl 300 Mg Tabs (Ranitidine Hcl) .Marland Kitchen.. 1 Po  Once Daily As Needed 5)  Skelaxin 800 Mg Tabs (Metaxalone) .... As Needed 6)  Klor-Con M20 20 Meq  Tbcr (Potassium Chloride Crys Cr) .... Once Daily 7)  Lanoxin 0.125 Mg Tabs (Digoxin) .Marland Kitchen.. 1 Tab Daily 8)  Cartia Xt 240 Mg  Cp24 (Diltiazem Hcl Coated Beads) .Marland Kitchen.. 1 By Mouth Qd 9)  Vitamin D3 1000 Unit  Tabs (Cholecalciferol) .Marland Kitchen.. 1 Qd 10)  Accu-Chek Aviva  Strp (Glucose Blood) .... Tests Once Daily Dx 250.00 11)  Pennsaid 1.5 % Soln (Diclofenac Sodium) .... 3-5 Gtt On Skin Three Times A Day For Pain 12)  Fish Oil 1000 Mg Caps (Omega-3 Fatty Acids) .... Take 1 Capsule By Mouth Once A Day 13)  Glucosamine-Chondroitin 500-400 Mg Caps (Glucosamine-Chondroitin) .... Take One By Mouth Once Daily 14)  Loratadine 10 Mg Tabs (Loratadine) .Marland Kitchen.. 1 By Mouth Once Daily As Needed Allergies 15)  Enoxaparin Sodium 80 Mg/0.32ml Soln (Enoxaparin Sodium) .... Inject 80mg  Subcutaneously Every 12 Hrs As Instructed 16)  Tramadol Hcl 50 Mg  Tabs (Tramadol Hcl) .Marland Kitchen.. 1 - 2 By Mouth Q 6 Hrs As Needed Pain  Allergies (verified): 1)  ! Sulfadiazine (Sulfadiazine) 2)  ! * Fish 3)  ! Quinine 4)  ! Fernand Parkins  Past History:  Past Medical History: Last updated: 08/17/2010  ATRIAL FIBRILLATION (ICD-427.31) ANTICOAGULATION THERAPY (ICD-V58.61) HYPERTENSION (ICD-401.9) CHEST PAIN (ICD-786.50) HEMATOMA (ICD-924.9) DIVERTICULOSIS, COLON (ICD-562.10) PARESTHESIA (ICD-782.0) CATARACT, SENILE NOS (ICD-366.10) SOFT TISSUE DISORDER, MOUTH (ICD-528.9) LEG PAIN (ICD-729.5) SWEATING (ICD-780.8) ALLERGIC RHINITIS (ICD-477.9) LOW BACK PAIN (ICD-724.2) VITAMIN D DEFICIENCY (ICD-268.9) DIABETES MELLITUS, TYPE II (ICD-250.00) GERD (ICD-530.81) St Jude valve mitral  valve prosthesis  Social History: Last updated: 05/21/2010 Occupation:Retired Married Former Smoker Regular exercise-yes, bowling Alcohol Use - no Illicit Drug Use - no Daily Caffeine Use rare Regular exercise-yes  Review of Systems  The patient denies weight loss, dyspnea on exertion, abdominal pain, and melena.    Physical Exam  General:  alert and overweight-appearing.   Ears:  R ear normal and L ear normal.   Nose:  no external deformity and no nasal discharge.   Mouth:  no gingival abnormalities and pharynx pink and moist.   Neck:  supple and no masses.   Lungs:  normal respiratory effort and normal breath sounds.   Heart:  normal  rate, regular rhythm, no murmur, and no rub. BLE without edema. Abdomen:  S/NT Msk:  left knee with significant warmth and effusion anterior only;  no signifcant warmth or sweling tothe medial//lateral aspectsor posterior;  has decreased ROM to about 90 degress only, mild crepitus but no click or catch Neurologic:  No cranial nerve deficits noted. Station and gait are normal. Plantar reflexes are down-going bilaterally. DTRs are symmetrical throughout. Sensory, motor and coordinative functions appear intact. Romberg (-) Heel to toe  nl Skin:  large 3 cm intact, square shapped, blister on central top of left foot - no redness or cellulitis Psych:  Cognition and judgment appear intact. Alert and cooperative with normal attention span and concentration. No apparent delusions, illusions, hallucinations   Impression & Recommendations:  Problem # 1:  MITRAL VALVE REPLACEMENT, HX OF (ICD-V15.1) Assessment Unchanged On the regimen of medicine(s) reflected in the chart    Problem # 2:  ANTICOAGULATION THERAPY (ICD-V58.61) Assessment: Unchanged On the regimen of medicine(s) reflected in the chart    Problem # 3:  ATRIAL FIBRILLATION (ICD-427.31) Assessment: Unchanged  Her updated medication list for this problem includes:    Warfarin Sodium 10 Mg Tabs (Warfarin sodium) ..... Use as directed by anticoagulation clinic    Coumadin 5 Mg Tabs (Warfarin sodium) .Marland Kitchen... As dirrected    Lanoxin 0.125 Mg Tabs (Digoxin) .Marland Kitchen... 1 tab daily    Cartia Xt 240 Mg Cp24 (Diltiazem hcl coated beads) .Marland Kitchen... 1 by mouth qd  Problem # 4:  HYPERTENSION (ICD-401.9) Assessment: Unchanged  Her updated medication list for this problem includes:    Furosemide 40 Mg Tabs (Furosemide) .Marland Kitchen... Take one tablet daily    Cartia Xt 240 Mg Cp24 (Diltiazem hcl coated beads) .Marland Kitchen... 1 by mouth qd  BP today: 130/82 Prior BP: 110/70 (08/17/2010)  Labs Reviewed: K+: 4.5 (05/21/2010) Creat: : 1.0 (05/21/2010)   Chol: 121 (09/28/2009)   HDL: 38.50 (09/28/2009)   LDL: 57 (09/28/2009)   TG: 126.0 (09/28/2009)  Orders: TLB-A1C / Hgb A1C (Glycohemoglobin) (83036-A1C) TLB-BMP (Basic Metabolic Panel-BMET) (80048-METABOL)  Problem # 5:  DIABETES MELLITUS, TYPE II (ICD-250.00) Assessment: Unchanged  Orders: TLB-A1C / Hgb A1C (Glycohemoglobin) (83036-A1C) TLB-BMP (Basic Metabolic Panel-BMET) (80048-METABOL)  Complete Medication List: 1)  Furosemide 40 Mg Tabs (Furosemide) .... Take one tablet daily 2)  Warfarin Sodium 10 Mg Tabs (Warfarin sodium) .... Use as  directed by anticoagulation clinic 3)  Coumadin 5 Mg Tabs (Warfarin sodium) .... As dirrected 4)  Ranitidine Hcl 300 Mg Tabs (Ranitidine hcl) .Marland Kitchen.. 1 po  once daily as needed 5)  Skelaxin 800 Mg Tabs (Metaxalone) .... As needed 6)  Klor-con M20 20 Meq Tbcr (Potassium chloride crys cr) .... Once daily 7)  Lanoxin 0.125 Mg Tabs (Digoxin) .Marland Kitchen.. 1 tab daily 8)  Cartia Xt 240 Mg Cp24 (Diltiazem hcl coated beads) .Marland Kitchen.. 1 by mouth qd 9)  Vitamin D3 1000 Unit Tabs (Cholecalciferol) .Marland Kitchen.. 1 qd 10)  Accu-chek Aviva Strp (Glucose blood) .... Tests once daily dx 250.00 11)  Pennsaid 1.5 % Soln (Diclofenac sodium) .... 3-5 gtt on skin three times a day for pain 12)  Fish Oil 1000 Mg Caps (Omega-3 fatty acids) .... Take 1 capsule by mouth once a day 13)  Glucosamine-chondroitin 500-400 Mg Caps (Glucosamine-chondroitin) .... Take one by mouth once daily 14)  Loratadine 10 Mg Tabs (Loratadine) .Marland Kitchen.. 1 by mouth once daily as needed allergies 15)  Enoxaparin Sodium 80 Mg/0.39ml Soln (Enoxaparin sodium) .... Inject 80mg  subcutaneously every 12 hrs as instructed  16)  Tramadol Hcl 50 Mg Tabs (Tramadol hcl) .Marland Kitchen.. 1 - 2 by mouth q 6 hrs as needed pain  Patient Instructions: 1)  Please schedule a follow-up appointment in 4 months well w/labs. 2)  HbgA1C prior to visit, ICD-9:250.00   Orders Added: 1)  Est. Patient Level IV [16109] 2)  TLB-A1C / Hgb A1C (Glycohemoglobin) [83036-A1C] 3)  TLB-BMP (Basic Metabolic Panel-BMET) [80048-METABOL]

## 2010-11-27 NOTE — Progress Notes (Signed)
  ROI faxed to [facility],records received back today gave to Simeon Craft [place]  August 30, 2010 4:37 PM     Appended Document:  correction,records received mailed to pt, not given to [facility]

## 2010-11-27 NOTE — Medication Information (Signed)
Summary: rov coumadin  Anticoagulant Therapy  Managed by: Leota Sauers, PharmD, BCPS, CPP Referring MD: Charlton Haws MD PCP: Jacinta Shoe, MD Supervising MD: Juanda Chance MD, Eulalie Speights Indication 1: Atrial Fibrillation (ICD-427.31) Indication 2: St. Jude Valve Type (ICD-SJV) Lab Used: [facility] Site: [facility] INR POC 2.1 INR RANGE 3 - 3.5  Dietary changes: yes       Details: has not had many greens in the last month  Health status changes: no    Bleeding/hemorrhagic complications: no    Recent/future hospitalizations: no    Any changes in medication regimen? no    Recent/future dental: no  Any missed doses?: yes     Details: may have missed doses  Is patient compliant with meds? yes      Comments: has had 4 family memebers pass in the last month  Current Medications (verified): 1)  Furosemide 40 Mg Tabs (Furosemide) .... Take One Tablet Daily 2)  Coumadin 10 Mg  Tabs (Warfarin Sodium) .... 6 Mg 6 Times A Week and 5mg  One Time A Week 3)  Coumadin 5 Mg Tabs (Warfarin Sodium) .... As Dirrected 4)  Ranitidine Hcl 300 Mg Tabs (Ranitidine Hcl) .Marland Kitchen.. 1 Po  Once Daily As Needed 5)  Skelaxin 800 Mg Tabs (Metaxalone) .... As Needed 6)  Klor-Con M20 20 Meq  Tbcr (Potassium Chloride Crys Cr) .... Once Daily 7)  Lanoxin 0.125 Mg Tabs (Digoxin) .Marland Kitchen.. 1 Tab Daily 8)  Cartia Xt 240 Mg  Cp24 (Diltiazem Hcl Coated Beads) .Marland Kitchen.. 1 By Mouth Qd 9)  Vitamin D3 1000 Unit  Tabs (Cholecalciferol) .Marland Kitchen.. 1 Qd 10)  Darvocet-N 100 100-650 Mg Tabs (Propoxyphene N-Apap) .Marland Kitchen.. 1 By Mouth Two Times A Day As Needed Pain 11)  Accu-Chek Aviva  Strp (Glucose Blood) .... Tests Once Daily Dx 250.00 12)  Pennsaid 1.5 % Soln (Diclofenac Sodium) .... 3-5 Gtt On Skin Three Times A Day For Pain  Allergies (verified): 1)  ! Sulfadiazine (Sulfadiazine) 2)  ! * Fish 3)  ! Quinine 4)  ! * Bananas  Anticoagulation Management History:      The patient is taking warfarin and comes in today for a routine follow up  visit.  Positive risk factors for bleeding include an age of 72 years or older and presence of serious comorbidities.  The bleeding index is 'intermediate risk'.  Positive CHADS2 values include History of HTN and History of Diabetes.  Negative CHADS2 values include Age > 72 years old.  The start date was 01/02/1998.  Her last INR was 1.9 ratio.  Anticoagulation responsible provider: Juanda Chance MD, Smitty Cords.  INR POC: 2.1.  Cuvette Lot#: [id number].  Exp: 01/2011.    Anticoagulation Management Assessment/Plan:      The patient's current anticoagulation dose is Coumadin 10 mg  tabs: 6 mg 6 times a week and 5mg  one time a week, Coumadin 5 mg tabs: as dirrected.  The target INR is 3 - 3.5.  The next INR is due 12/11/2009.  Results were reviewed/authorized by Leota Sauers, PharmD, BCPS, CPP.         Prior Anticoagulation Instructions: 10 MG QD  Current Anticoagulation Instructions: INR 2.1  below range 3-3.5  Coumadin 12.5mg  today and tomorrow 2/3 and 2/4   Then continue 10mg  each day except 5mg  on Thur

## 2010-11-27 NOTE — Assessment & Plan Note (Signed)
Summary: BRUISE OR BLISTER ON FOOT/ DIABETIC/ AVP'S PT/NWS   Vital Signs:  Patient profile:   70 year old female Height:      62 inches (157.48 cm) Weight:      186 pounds (84.55 kg) O2 Sat:      98 % on Room air Temp:     97.3 degrees F (36.28 degrees C) oral Pulse rate:   76 / minute BP sitting:   118 / 76  (left arm) Cuff size:   regular  Vitals Entered By: Orlan Leavens (Mar 13, 2010 1:56 PM)  O2 Flow:  Room air CC: Sore/ Blister on (L) foot Is Patient Diabetic? Yes Did you bring your meter with you today? No Pain Assessment Patient in pain? no        Primary Care Provider:  Jacinta Shoe, MD  CC:  Sore/ Blister on (L) foot.  History of Present Illness: worse tight fitting shoes 3 days ago - now with blister on top of left foot where rubber rubbed on foot -   Current Medications (verified): 1)  Furosemide 40 Mg Tabs (Furosemide) .... Take One Tablet Daily 2)  Warfarin Sodium 10 Mg Tabs (Warfarin Sodium) .... Use As Directed By Anticoagulation Clinic 3)  Coumadin 5 Mg Tabs (Warfarin Sodium) .... As Dirrected 4)  Ranitidine Hcl 300 Mg Tabs (Ranitidine Hcl) .Marland Kitchen.. 1 Po  Once Daily As Needed 5)  Skelaxin 800 Mg Tabs (Metaxalone) .... As Needed 6)  Klor-Con M20 20 Meq  Tbcr (Potassium Chloride Crys Cr) .... Once Daily 7)  Lanoxin 0.125 Mg Tabs (Digoxin) .Marland Kitchen.. 1 Tab Daily 8)  Cartia Xt 240 Mg  Cp24 (Diltiazem Hcl Coated Beads) .Marland Kitchen.. 1 By Mouth Qd 9)  Vitamin D3 1000 Unit  Tabs (Cholecalciferol) .Marland Kitchen.. 1 Qd 10)  Accu-Chek Aviva  Strp (Glucose Blood) .... Tests Once Daily Dx 250.00 11)  Pennsaid 1.5 % Soln (Diclofenac Sodium) .... 3-5 Gtt On Skin Three Times A Day For Pain 12)  Fish Oil 1000 Mg Caps (Omega-3 Fatty Acids) .... Take 1 Capsule By Mouth Once A Day  Allergies (verified): 1)  ! Sulfadiazine (Sulfadiazine) 2)  ! * Fish 3)  ! Quinine 4)  ! Fernand Parkins  Past History:  Past Medical History: Current Problems:    ATRIAL FIBRILLATION (ICD-427.31) ANTICOAGULATION  THERAPY (ICD-V58.61) HYPERTENSION (ICD-401.9) CHEST PAIN (ICD-786.50) HEMATOMA (ICD-924.9) DIVERTICULOSIS, COLON (ICD-562.10) PARESTHESIA (ICD-782.0) CATARACT, SENILE NOS (ICD-366.10) SOFT TISSUE DISORDER, MOUTH (ICD-528.9) LEG PAIN (ICD-729.5) SWEATING (ICD-780.8) ALLERGIC RHINITIS (ICD-477.9) LOW BACK PAIN (ICD-724.2) VITAMIN D DEFICIENCY (ICD-268.9) DIABETES MELLITUS, TYPE II (ICD-250.00) GERD (ICD-530.81)  St Jude valve  mitral  valve prosthesis  Review of Systems  The patient denies fever and difficulty walking.    Physical Exam  General:  well-developed and overweight-appearing.   Lungs:  normal respiratory effort, no intercostal retractions or use of accessory muscles; normal breath sounds bilaterally - no crackles and no wheezes.    Heart:  normal rate, regular rhythm, no murmur, and no rub. BLE without edema. Skin:  large 3 cm intact, square shapped, blister on central top of left foot - no redness or cellulitis   Impression & Recommendations:  Problem # 1:  BLISTER, LEFT FOOT (ICD-917.2) no signs or symptoms or infection - simple abrasion blister on dorsum of foot surface- reassurance provided - signs to watch for reviewed: redness or purlant drainage or fever wear socks and shoes and avoid abrasion!  Problem # 2:  DIABETES MELLITUS, TYPE II (ICD-250.00)  Labs Reviewed: Creat:  0.9 (01/31/2010)    Reviewed HgBA1c results: 6.4 (01/31/2010)  6.3 (09/28/2009)  Complete Medication List: 1)  Furosemide 40 Mg Tabs (Furosemide) .... Take one tablet daily 2)  Warfarin Sodium 10 Mg Tabs (Warfarin sodium) .... Use as directed by anticoagulation clinic 3)  Coumadin 5 Mg Tabs (Warfarin sodium) .... As dirrected 4)  Ranitidine Hcl 300 Mg Tabs (Ranitidine hcl) .Marland Kitchen.. 1 po  once daily as needed 5)  Skelaxin 800 Mg Tabs (Metaxalone) .... As needed 6)  Klor-con M20 20 Meq Tbcr (Potassium chloride crys cr) .... Once daily 7)  Lanoxin 0.125 Mg Tabs (Digoxin) .Marland Kitchen.. 1 tab  daily 8)  Cartia Xt 240 Mg Cp24 (Diltiazem hcl coated beads) .Marland Kitchen.. 1 by mouth qd 9)  Vitamin D3 1000 Unit Tabs (Cholecalciferol) .Marland Kitchen.. 1 qd 10)  Accu-chek Aviva Strp (Glucose blood) .... Tests once daily dx 250.00 11)  Pennsaid 1.5 % Soln (Diclofenac sodium) .... 3-5 gtt on skin three times a day for pain 12)  Fish Oil 1000 Mg Caps (Omega-3 fatty acids) .... Take 1 capsule by mouth once a day  Patient Instructions: 1)  it was good to see you today. 2)  no signs or symptoms of infection - watch for redness or thick drainage - 3)  keep covered and clean - remember to wear socks and shoes and avoid tight fitting shoes that rub on your feet! 4)  Please schedule a follow-up appointment as needed.

## 2010-11-27 NOTE — Assessment & Plan Note (Signed)
Summary: L KNEE PROBLEM/ AVP'S PT /NWS   Vital Signs:  Patient profile:   70 year old female Height:      62 inches Weight:      183.25 pounds BMI:     33.64 O2 Sat:      97 % on Room air Temp:     99.1 degrees F oral Pulse rate:   79 / minute BP sitting:   110 / 70  (left arm) Cuff size:   regular  Vitals Entered By: Zella Ball Ewing CMA Duncan Dull) (August 17, 2010 4:22 PM)  O2 Flow:  Room air CC: Left knee painful and stiff/RE   Primary Care Provider:  Jacinta Shoe, MD  CC:  Left knee painful and stiff/RE.  History of Present Illness: here for acute - c/o mod to severe acute onset left knee pain and swelling , began last evening adn quite still and limping to walk to today;  no falls, injury, trauma or twisting that she recalls, but did spend the afternoon bowling yesterday with friends.  No fever, hx of gout or other.  No prior hx of same in the past.  Pt denies CP, worsening sob, doe, wheezing, orthopnea, pnd, worsening LE edema, palps, dizziness or syncope  Pt denies new neuro symptoms such as headache, facial or extremity weakness  No wt loss, night sweats, loss of appetite or other constitutional symptoms Denies worsening polydipsia or polyuria.    Problems Prior to Update: 1)  Knee Pain, Left  (ICD-719.46) 2)  Pharyngitis, Acute  (ICD-462) 3)  Trigger Finger  (ICD-727.03) 4)  Blister, Left Foot  (ICD-917.2) 5)  Foot Pain  (ICD-729.5) 6)  Transient Ischemic Attack  (ICD-435.9) 7)  Uri  (ICD-465.9) 8)  Uti  (ICD-599.0) 9)  Constipation  (ICD-564.00) 10)  Tobacco Use, Quit  (ICD-V15.82) 11)  Mitral Valve Replacement, Hx of  (ICD-V15.1) 12)  Aortic Valve Disorders  (ICD-424.1) 13)  Carotid Stenosis  (ICD-433.10) 14)  Atrial Fibrillation  (ICD-427.31) 15)  Anticoagulation Therapy  (ICD-V58.61) 16)  Hypertension  (ICD-401.9) 17)  Chest Pain  (ICD-786.50) 18)  Hematoma  (ICD-924.9) 19)  Diverticulosis, Colon  (ICD-562.10) 20)  Paresthesia  (ICD-782.0) 21)  Cataract,  Senile Nos  (ICD-366.10) 22)  Soft Tissue Disorder, Mouth  (ICD-528.9) 23)  Leg Pain  (ICD-729.5) 24)  Sweating  (ICD-780.8) 25)  Allergic Rhinitis  (ICD-477.9) 26)  Low Back Pain  (ICD-724.2) 27)  Vitamin D Deficiency  (ICD-268.9) 28)  Diabetes Mellitus, Type II  (ICD-250.00) 29)  Gerd  (ICD-530.81)  Medications Prior to Update: 1)  Furosemide 40 Mg Tabs (Furosemide) .... Take One Tablet Daily 2)  Warfarin Sodium 10 Mg Tabs (Warfarin Sodium) .... Use As Directed By Anticoagulation Clinic 3)  Coumadin 5 Mg Tabs (Warfarin Sodium) .... As Dirrected 4)  Ranitidine Hcl 300 Mg Tabs (Ranitidine Hcl) .Marland Kitchen.. 1 Po  Once Daily As Needed 5)  Skelaxin 800 Mg Tabs (Metaxalone) .... As Needed 6)  Klor-Con M20 20 Meq  Tbcr (Potassium Chloride Crys Cr) .... Once Daily 7)  Lanoxin 0.125 Mg Tabs (Digoxin) .Marland Kitchen.. 1 Tab Daily 8)  Cartia Xt 240 Mg  Cp24 (Diltiazem Hcl Coated Beads) .Marland Kitchen.. 1 By Mouth Qd 9)  Vitamin D3 1000 Unit  Tabs (Cholecalciferol) .Marland Kitchen.. 1 Qd 10)  Accu-Chek Aviva  Strp (Glucose Blood) .... Tests Once Daily Dx 250.00 11)  Pennsaid 1.5 % Soln (Diclofenac Sodium) .... 3-5 Gtt On Skin Three Times A Day For Pain 12)  Fish Oil 1000 Mg Caps (Omega-3  Fatty Acids) .... Take 1 Capsule By Mouth Once A Day 13)  Glucosamine-Chondroitin 500-400 Mg Caps (Glucosamine-Chondroitin) .... Take One By Mouth Once Daily 14)  Loratadine 10 Mg Tabs (Loratadine) .Marland Kitchen.. 1 By Mouth Once Daily As Needed Allergies 15)  Enoxaparin Sodium 80 Mg/0.70ml Soln (Enoxaparin Sodium) .... Inject 80mg  Subcutaneously Every 12 Hrs As Instructed  Current Medications (verified): 1)  Furosemide 40 Mg Tabs (Furosemide) .... Take One Tablet Daily 2)  Warfarin Sodium 10 Mg Tabs (Warfarin Sodium) .... Use As Directed By Anticoagulation Clinic 3)  Coumadin 5 Mg Tabs (Warfarin Sodium) .... As Dirrected 4)  Ranitidine Hcl 300 Mg Tabs (Ranitidine Hcl) .Marland Kitchen.. 1 Po  Once Daily As Needed 5)  Skelaxin 800 Mg Tabs (Metaxalone) .... As Needed 6)  Klor-Con  M20 20 Meq  Tbcr (Potassium Chloride Crys Cr) .... Once Daily 7)  Lanoxin 0.125 Mg Tabs (Digoxin) .Marland Kitchen.. 1 Tab Daily 8)  Cartia Xt 240 Mg  Cp24 (Diltiazem Hcl Coated Beads) .Marland Kitchen.. 1 By Mouth Qd 9)  Vitamin D3 1000 Unit  Tabs (Cholecalciferol) .Marland Kitchen.. 1 Qd 10)  Accu-Chek Aviva  Strp (Glucose Blood) .... Tests Once Daily Dx 250.00 11)  Pennsaid 1.5 % Soln (Diclofenac Sodium) .... 3-5 Gtt On Skin Three Times A Day For Pain 12)  Fish Oil 1000 Mg Caps (Omega-3 Fatty Acids) .... Take 1 Capsule By Mouth Once A Day 13)  Glucosamine-Chondroitin 500-400 Mg Caps (Glucosamine-Chondroitin) .... Take One By Mouth Once Daily 14)  Loratadine 10 Mg Tabs (Loratadine) .Marland Kitchen.. 1 By Mouth Once Daily As Needed Allergies 15)  Enoxaparin Sodium 80 Mg/0.74ml Soln (Enoxaparin Sodium) .... Inject 80mg  Subcutaneously Every 12 Hrs As Instructed 16)  Tramadol Hcl 50 Mg Tabs (Tramadol Hcl) .Marland Kitchen.. 1 - 2 By Mouth Q 6 Hrs As Needed Pain  Allergies (verified): 1)  ! Sulfadiazine (Sulfadiazine) 2)  ! * Fish 3)  ! Quinine 4)  ! Fernand Parkins  Past History:  Past Surgical History: Last updated: 05/14/2010 Valve surgery 1995 Hysterectomy cholecystectomy Mitral valve replacement.  Social History: Last updated: 05/21/2010 Occupation:Retired Married Former Smoker Regular exercise-yes, bowling Alcohol Use - no Illicit Drug Use - no Daily Caffeine Use rare Regular exercise-yes  Risk Factors: Exercise: yes (05/21/2010)  Risk Factors: Smoking Status: quit (09/28/2009)  Past Medical History:  ATRIAL FIBRILLATION (ICD-427.31) ANTICOAGULATION THERAPY (ICD-V58.61) HYPERTENSION (ICD-401.9) CHEST PAIN (ICD-786.50) HEMATOMA (ICD-924.9) DIVERTICULOSIS, COLON (ICD-562.10) PARESTHESIA (ICD-782.0) CATARACT, SENILE NOS (ICD-366.10) SOFT TISSUE DISORDER, MOUTH (ICD-528.9) LEG PAIN (ICD-729.5) SWEATING (ICD-780.8) ALLERGIC RHINITIS (ICD-477.9) LOW BACK PAIN (ICD-724.2) VITAMIN D DEFICIENCY (ICD-268.9) DIABETES MELLITUS, TYPE II  (ICD-250.00) GERD (ICD-530.81) St Jude valve mitral  valve prosthesis  Review of Systems       all otherwise negative per pt -    Physical Exam  General:  alert and overweight-appearing.   Head:  normocephalic and atraumatic.   Eyes:  vision grossly intact, pupils equal, and pupils round.   Ears:  R ear normal and L ear normal.   Nose:  no external deformity and no nasal discharge.   Mouth:  no gingival abnormalities and pharynx pink and moist.   Neck:  supple and no masses.   Lungs:  normal respiratory effort and normal breath sounds.   Heart:  normal rate, regular rhythm, no murmur, and no rub. BLE without edema. Msk:  left knee with significant warmth and effusion anterior only;  no signifcant warmth or sweling tothe medial//lateral aspectsor posterior;  has decreased ROM to about 90 degress only, mild crepitus but no click  or catch Extremities:  no edema, no erythema    Impression & Recommendations:  Problem # 1:  KNEE PAIN, LEFT (ICD-719.46)  Her updated medication list for this problem includes:    Skelaxin 800 Mg Tabs (Metaxalone) .Marland Kitchen... As needed    Tramadol Hcl 50 Mg Tabs (Tramadol hcl) .Marland Kitchen... 1 - 2 by mouth q 6 hrs as needed pain  Orders: T-Knee Comp Left 4 Views [id number]) I suspect pain and effusion related to underlying knee DJD, prob primarily post patellar related; to check film, hold off on MRI for now;  consider ortho if not improved 1-2 wks,  currently with low suspicion for ligament injury or meniscal tear  Problem # 2:  HYPERTENSION (ICD-401.9)  Her updated medication list for this problem includes:    Furosemide 40 Mg Tabs (Furosemide) .Marland Kitchen... Take one tablet daily    Cartia Xt 240 Mg Cp24 (Diltiazem hcl coated beads) .Marland Kitchen... 1 by mouth qd  BP today: 110/70 Prior BP: 108/70 (06/04/2010)  Labs Reviewed: K+: 4.5 (05/21/2010) Creat: : 1.0 (05/21/2010)   Chol: 121 (09/28/2009)   HDL: 38.50 (09/28/2009)   LDL: 57 (09/28/2009)   TG: 126.0 (09/28/2009) stable  overall by hx and exam, ok to continue meds/tx as is   Complete Medication List: 1)  Furosemide 40 Mg Tabs (Furosemide) .... Take one tablet daily 2)  Warfarin Sodium 10 Mg Tabs (Warfarin sodium) .... Use as directed by anticoagulation clinic 3)  Coumadin 5 Mg Tabs (Warfarin sodium) .... As dirrected 4)  Ranitidine Hcl 300 Mg Tabs (Ranitidine hcl) .Marland Kitchen.. 1 po  once daily as needed 5)  Skelaxin 800 Mg Tabs (Metaxalone) .... As needed 6)  Klor-con M20 20 Meq Tbcr (Potassium chloride crys cr) .... Once daily 7)  Lanoxin 0.125 Mg Tabs (Digoxin) .Marland Kitchen.. 1 tab daily 8)  Cartia Xt 240 Mg Cp24 (Diltiazem hcl coated beads) .Marland Kitchen.. 1 by mouth qd 9)  Vitamin D3 1000 Unit Tabs (Cholecalciferol) .Marland Kitchen.. 1 qd 10)  Accu-chek Aviva Strp (Glucose blood) .... Tests once daily dx 250.00 11)  Pennsaid 1.5 % Soln (Diclofenac sodium) .... 3-5 gtt on skin three times a day for pain 12)  Fish Oil 1000 Mg Caps (Omega-3 fatty acids) .... Take 1 capsule by mouth once a day 13)  Glucosamine-chondroitin 500-400 Mg Caps (Glucosamine-chondroitin) .... Take one by mouth once daily 14)  Loratadine 10 Mg Tabs (Loratadine) .Marland Kitchen.. 1 by mouth once daily as needed allergies 15)  Enoxaparin Sodium 80 Mg/0.26ml Soln (Enoxaparin sodium) .... Inject 80mg  subcutaneously every 12 hrs as instructed 16)  Tramadol Hcl 50 Mg Tabs (Tramadol hcl) .Marland Kitchen.. 1 - 2 by mouth q 6 hrs as needed pain  Patient Instructions: 1)  Please take all new medications as prescribed - the pain medicine 2)  please do not take OTC advil or alleve when you are on the coumadin 3)  Please go to Radiology in the basement level for your X-Ray today  4)  Please call the number on the [facility] for results of your testing  5)  Please call in 1-2 wks if not improved for orthopedic consult 6)  Please schedule an appointment with your primary doctor as needed Prescriptions: TRAMADOL HCL 50 MG TABS (TRAMADOL HCL) 1 - 2 by mouth q 6 hrs as needed pain  #60 x 1   Entered and Authorized  by:   Corwin Levins MD   Signed by:   Corwin Levins MD on 08/17/2010   Method used:   Print then  Give to Patient   RxID:   [id number]    Orders Added: 1)  T-Knee Comp Left 4 Views [73564TC] 2)  Est. Patient Level IV [14782]

## 2010-11-27 NOTE — Letter (Signed)
Summary: [facility]   Imported By: Lester Hamden 06/12/2010 10:41:39  _____________________________________________________________________  External Attachment:    Type:   Image     Comment:   External Document

## 2010-11-27 NOTE — Letter (Signed)
Summary: Colonoscopy Date Change Letter  [facility]  [address] [place], [place] [address]   Phone: [phone]  Fax: [phone]      October 31, 2009 MRN: [id number]   [facility] [address] [place], [place]  [address]   Dear Ms. Buehrle,   Previously you were recommended to have a repeat colonoscopy around this time. Your chart was recently reviewed by Dr. Lina Sar of [facility]. Follow up colonoscopy is now recommended in July 2011. This revised recommendation is based on current, nationally recognized guidelines for colorectal cancer screening and polyp surveillance. These guidelines are endorsed by the American Cancer Society, [employer] on Colorectal Cancer as well as numerous other major medical organizations.  Please understand that our recommendation assumes that you do not have any new symptoms such as bleeding, a change in bowel habits, anemia, or significant abdominal discomfort. If you do have any concerning GI symptoms or want to discuss the guideline recommendations, please call to arrange an office visit at your earliest convenience. Otherwise we will keep you in our reminder system and contact you 1-2 months prior to the date listed above to schedule your next colonoscopy.  Thank you,  Hedwig Morton. Juanda Chance, M.D.  [facility] [phone]

## 2010-11-27 NOTE — Assessment & Plan Note (Signed)
Summary: 6 MO F/U   Visit Type:  6 months follow up Referring Provider:  Jacinta Shoe, MD Primary Provider:  Jacinta Shoe, MD  CC:  chest pains about 2 weeks ago- Pt. did not take her Diltiazem for 2 days . Marland Kitchen  History of Present Illness: Beth Miller is seen today in followup for her prosthetic mitral valve, chronic atrial fibrillation, anticoagulation with Coumadin and carotid disease.  His been doing well.  Her last echo in December which showed good LV function and a normal functioning prostatic valve.  He's not had any TIA or CVA-like symptoms.  She understands that greens affect her Coumadin level.  Her INRs have been therapeutic.  I did tell her I thought she was one of these people with chronic A. fib and a mitral mechanical valve that should have Lovenox overlap for stopping her Coumadin.  She has not had any significant chest pain PND or orthopnea there've been no lower extremity edema.  She has had some hot flashes which sound more postmenopausal in nature.  She has not had any significant palpitations or syncope.  She's been compliant with her medications.   I reviewed her carotid duplex from today.  She has minimal plaque and no real stenosis.  The RICA is estimated at 40-59% only from tortuosity I reviewed her echo and her MVR is funcitoning normally with EF 50% and signiificant biatrial enlargement.    Current Problems (verified): 1)  Tobacco Use, Quit  (ICD-V15.82) 2)  Mitral Valve Replacement, Hx of  (ICD-V15.1) 3)  Aortic Valve Disorders  (ICD-424.1) 4)  Carotid Stenosis  (ICD-433.10) 5)  Atrial Fibrillation  (ICD-427.31) 6)  Anticoagulation Therapy  (ICD-V58.61) 7)  Hypertension  (ICD-401.9) 8)  Chest Pain  (ICD-786.50) 9)  Hematoma  (ICD-924.9) 10)  Diverticulosis, Colon  (ICD-562.10) 11)  Paresthesia  (ICD-782.0) 12)  Cataract, Senile Nos  (ICD-366.10) 13)  Soft Tissue Disorder, Mouth  (ICD-528.9) 14)  Leg Pain  (ICD-729.5) 15)  Sweating  (ICD-780.8) 16)   Allergic Rhinitis  (ICD-477.9) 17)  Low Back Pain  (ICD-724.2) 18)  Vitamin D Deficiency  (ICD-268.9) 19)  Diabetes Mellitus, Type II  (ICD-250.00) 20)  Gerd  (ICD-530.81)  Current Medications (verified): 1)  Furosemide 40 Mg Tabs (Furosemide) .... Take One Tablet Daily 2)  Coumadin 10 Mg  Tabs (Warfarin Sodium) .... 6 Mg 6 Times A Week and 5mg  One Time A Week 3)  Coumadin 5 Mg Tabs (Warfarin Sodium) .... As Dirrected 4)  Ranitidine Hcl 300 Mg Tabs (Ranitidine Hcl) .Marland Kitchen.. 1 Po  Once Daily As Needed 5)  Skelaxin 800 Mg Tabs (Metaxalone) .... As Needed 6)  Klor-Con M20 20 Meq  Tbcr (Potassium Chloride Crys Cr) .... Once Daily 7)  Lanoxin 0.125 Mg Tabs (Digoxin) .Marland Kitchen.. 1 Tab Daily 8)  Cartia Xt 240 Mg  Cp24 (Diltiazem Hcl Coated Beads) .Marland Kitchen.. 1 By Mouth Qd 9)  Vitamin D3 1000 Unit  Tabs (Cholecalciferol) .Marland Kitchen.. 1 Qd 10)  Darvocet-N 100 100-650 Mg Tabs (Propoxyphene N-Apap) .Marland Kitchen.. 1 By Mouth Two Times A Day As Needed Pain 11)  Accu-Chek Aviva  Strp (Glucose Blood) .... Tests Once Daily Dx 250.00 12)  Pennsaid 1.5 % Soln (Diclofenac Sodium) .... 3-5 Gtt On Skin Three Times A Day For Pain  Allergies: 1)  ! Sulfadiazine (Sulfadiazine) 2)  ! * Fish 3)  ! Quinine 4)  ! Fernand Parkins  Past History:  Past Medical History: Last updated: 04/27/2009 Current Problems:  ATRIAL FIBRILLATION (ICD-427.31) ANTICOAGULATION THERAPY (ICD-V58.61) HYPERTENSION (ICD-401.9)  CHEST PAIN (ICD-786.50) HEMATOMA (ICD-924.9) DIVERTICULOSIS, COLON (ICD-562.10) PARESTHESIA (ICD-782.0) CATARACT, SENILE NOS (ICD-366.10) SOFT TISSUE DISORDER, MOUTH (ICD-528.9) LEG PAIN (ICD-729.5) SWEATING (ICD-780.8) ALLERGIC RHINITIS (ICD-477.9) LOW BACK PAIN (ICD-724.2) VITAMIN D DEFICIENCY (ICD-268.9) DIABETES MELLITUS, TYPE II (ICD-250.00) GERD (ICD-530.81)  St Jude valve  mitral  valve prosthesis  Past Surgical History: Last updated: 04/27/2009 Valve surgery 1995 Hysterectomy cholecystectomy  Mitral valve  replacement.  Family History: Last updated: 02/15/2009 Family History Hypertension Family History of Diabetes: Mother, Brother Family History of Heart Disease: Brother No FH of Colon Cancer: Family History of Pancreatic Cancer:Brother  Social History: Last updated: 02/15/2009 Occupation:Retired Married Former Smoker Regular exercise-yes, bowling Alcohol Use - no Illicit Drug Use - no  Vital Signs:  Patient profile:   70 year old female Height:      62 inches Weight:      188.75 pounds BMI:     34.65 Pulse rate:   74 / minute Pulse rhythm:   irregular Resp:     18 per minute BP sitting:   130 / 84  (left arm) Cuff size:   large  Vitals Entered By: Vikki Ports (November 30, 2009 4:04 PM)  Physical Exam  General:  Affect appropriate Healthy:  appears stated age HEENT: normal Neck supple with no adenopathy JVP normal no bruits no thyromegaly Lungs clear with no wheezing and good diaphragmatic motion Heart:  S1 click /S2 no murmur,rub, gallop or click PMI normal Abdomen: benighn, BS positve, no tenderness, no AAA no bruit.  No HSM or HJR Distal pulses intact with no bruits No edema Neuro non-focal Skin warm and dry    Impression & Recommendations:  Problem # 1:  MITRAL VALVE REPLACEMENT, HX OF (ICD-V38.100) 70 years old.  Normal functoin by echo.  Continue anticoagulation and SBE prophylaxis  Problem # 2:  CAROTID STENOSIS (ICD-433.10) F/U duplex in 2 years.  Primarily tortuosity on right with minimal plaque Her updated medication list for this problem includes:    Coumadin 10 Mg Tabs (Warfarin sodium) .Marland KitchenMarland KitchenMarland KitchenMarland Kitchen 6 mg 6 times a week and 5mg  one time a week    Coumadin 5 Mg Tabs (Warfarin sodium) .Marland Kitchen... As dirrected  Problem # 3:  ATRIAL FIBRILLATION (ICD-427.31) Chronic with good rate control  continue AV nodal blocking drugs The following medications were removed from the medication list:    Lanoxin 0.25 Mg Tabs (Digoxin) .Marland Kitchen... 1 tab by mouth once daily Her  updated medication list for this problem includes:    Coumadin 10 Mg Tabs (Warfarin sodium) .Marland KitchenMarland KitchenMarland KitchenMarland Kitchen 6 mg 6 times a week and 5mg  one time a week    Coumadin 5 Mg Tabs (Warfarin sodium) .Marland Kitchen... As dirrected    Lanoxin 0.125 Mg Tabs (Digoxin) .Marland Kitchen... 1 tab daily  Problem # 4:  ANTICOAGULATION THERAPY (ICD-V58.61) Missed last coumadin appt secondary to multiple deaths in extended family.  INR Rx today and will F/U in 4 weeks   Patient Instructions: 1)  Your physician recommends that you schedule a follow-up appointment in: 6 months

## 2010-11-27 NOTE — Letter (Signed)
Summary: Anticoagulation Modification Letter  [facility]  [address] [place], [place] [address]   Phone: [phone]  Fax: [phone]    May 14, 2010  Re:    Beth Miller DOB:    [birth year] MRN:    [id number]    Dear Dr [name]:  We have scheduled the above patient for an endoscopic procedure. Our records show that she is on anticoagulation therapy. Please advise as to if the patient should discontinue warfarin or be bridged with lovenox prior to the scheduled procedure on 07/11/10.   Please fax back/or route the completed form to Dottie at [phone] Thank you for your help with this matter.  Sincerely,  Dottie Nelson-Smith CMA Duncan Dull)   Physician Recommendation:  Hold Coumadin 5 days prior ____________  Other ______________________________     Appended Document: Anticoagulation Modification Letter With afib and MVR needs lovenox bridge.  Does not need to be hospitalized for it.  Coumadin clinic should be able to help with this.  I replied to a flag from Verlee Monte on this last week or earlier this week

## 2010-11-27 NOTE — Medication Information (Signed)
Summary: rov/eac  Anticoagulant Therapy  Managed by: Bethena Midget, RN, BSN Referring MD: Charlton Haws MD PCP: Jacinta Shoe, MD Supervising MD: Shirlee Latch MD, Evola Hollis Indication 1: Atrial Fibrillation (ICD-427.31) Indication 2: St. Jude Valve Type (ICD-SJV) Lab Used: [facility] Site: [facility] INR POC 2.7 INR RANGE 3 - 3.5  Dietary changes: no    Health status changes: no    Bleeding/hemorrhagic complications: no    Recent/future hospitalizations: no    Any changes in medication regimen? yes       Details: mucinex PRN for 7 days.   Recent/future dental: no  Any missed doses?: no       Is patient compliant with meds? yes       Allergies: 1)  ! Sulfadiazine (Sulfadiazine) 2)  ! * Fish 3)  ! Quinine 4)  ! * Bananas  Anticoagulation Management History:      The patient is taking warfarin and comes in today for a routine follow up visit.  Positive risk factors for bleeding include an age of 72 years or older and presence of serious comorbidities.  The bleeding index is 'intermediate risk'.  Positive CHADS2 values include History of HTN and History of Diabetes.  Negative CHADS2 values include Age > 53 years old.  The start date was 01/02/1998.  Her last INR was 1.9 ratio.  Anticoagulation responsible provider: Shirlee Latch MD, Hakeem Frazzini.  INR POC: 2.7.  Exp: 01/2011.    Anticoagulation Management Assessment/Plan:      The patient's current anticoagulation dose is Warfarin sodium 10 mg tabs: Use as directed by Anticoagulation Clinic, Coumadin 5 mg tabs: as dirrected.  The target INR is 3 - 3.5.  The next INR is due 01/08/2010.  Anticoagulation instructions were given to patient.  Results were reviewed/authorized by Bethena Midget, RN, BSN.  She was notified by Bethena Midget, RN, BSN.         Prior Anticoagulation Instructions: INR 2.4  Take 15 mg today.  Then start NEW dosing schedule of 10 mg every day.  Return to clinic in 2 weeks.   Current Anticoagulation Instructions: INR  2.7 Today take 15mg s then change dose to 10mg s everyday except 15mg s on Mondays. Recheck in 2 weeks.  Prescriptions: WARFARIN SODIUM 10 MG TABS (WARFARIN SODIUM) Use as directed by Anticoagulation Clinic  #40 x 3   Entered by:   Bethena Midget, RN, BSN   Authorized by:   Colon Branch, MD, [facility]   Signed by:   Bethena Midget, RN, BSN on 12/25/2009   Method used:   Electronically to        [facility] [address] (retail)       [address]       [place], [place]  [id number]       Ph: [phone]       Fax: [phone]   RxID:   [id number] COUMADIN 5 MG TABS (WARFARIN SODIUM) as dirrected  #30 Tablet x 3   Entered by:   Bethena Midget, RN, BSN   Authorized by:   Colon Branch, MD, [facility]   Signed by:   Bethena Midget, RN, BSN on 12/25/2009   Method used:   Electronically to        [facility] (retail)       [address]       [place], [place]  [id number]       Ph: [phone]       Fax: [phone]   RxID:  [id number]  

## 2010-11-27 NOTE — Medication Information (Signed)
Summary: rov/ewj  Anticoagulant Therapy  Managed by: Bethena Midget, RN, BSN Referring MD: Charlton Haws MD PCP: Jacinta Shoe, MD Supervising MD: Clifton James MD, Cristal Deer Indication 1: Atrial Fibrillation (ICD-427.31) Indication 2: St. Jude Valve Type (ICD-SJV) Lab Used: [facility] Site: [facility] INR POC 2.9 INR RANGE 3 - 3.5  Dietary changes: no    Health status changes: no    Bleeding/hemorrhagic complications: no    Recent/future hospitalizations: no    Any changes in medication regimen? no    Recent/future dental: no  Any missed doses?: yes     Details: missed a dose last week, ? Wednesday.  Is patient compliant with meds? yes       Allergies: 1)  ! Sulfadiazine (Sulfadiazine) 2)  ! * Fish 3)  ! Quinine 4)  ! * Bananas  Anticoagulation Management History:      The patient is taking warfarin and comes in today for a routine follow up visit.  Positive risk factors for bleeding include an age of 72 years or older and presence of serious comorbidities.  The bleeding index is 'intermediate risk'.  Positive CHADS2 values include History of HTN and History of Diabetes.  Negative CHADS2 values include Age > 43 years old.  The start date was 01/02/1998.  Her last INR was 1.9 ratio.  Anticoagulation responsible provider: Clifton James MD, Cristal Deer.  INR POC: 2.9.  Cuvette Lot#: [id number].  Exp: 02/2011.    Anticoagulation Management Assessment/Plan:      The patient's current anticoagulation dose is Warfarin sodium 10 mg tabs: Use as directed by Anticoagulation Clinic, Coumadin 5 mg tabs: as dirrected.  The target INR is 3 - 3.5.  The next INR is due 02/12/2010.  Anticoagulation instructions were given to patient.  Results were reviewed/authorized by Bethena Midget, RN, BSN.  She was notified by Bethena Midget, RN, BSN.         Prior Anticoagulation Instructions: INR 2.7  Start taking 10mg  daily except 12.5mg  on Mondays, Wednesdays, and Fridays.  Recheck in 3 weeks.     Current Anticoagulation Instructions: INR 2.9 Today take 15mg s then resume 10mg s daily except 12.5mg   on Mondays, Wednesdays and Fridays. Recheck in 2 weeks.

## 2010-11-27 NOTE — Assessment & Plan Note (Signed)
Summary: flu shot/plot/cd  Nurse Visit   Vital Signs:  Patient profile:   70 year old female Temp:     97.4 degrees F oral  Vitals Entered By: Lanier Prude, CMA(AAMA) (September 04, 2010 1:21 PM)  Allergies: 1)  ! Sulfadiazine (Sulfadiazine) 2)  ! * Fish 3)  ! Quinine 4)  ! * Bananas  Orders Added: 1)  Flu Vaccine 79yrs + MEDICARE PATIENTS [Q2039] 2)  Administration Flu vaccine - MCR [G0008]        Flu Vaccine Consent Questions     Do you have a history of severe allergic reactions to this vaccine? no    Any prior history of allergic reactions to egg and/or gelatin? no    Do you have a sensitivity to the preservative Thimersol? no    Do you have a past history of Guillan-Barre Syndrome? no    Do you currently have an acute febrile illness? no    Have you ever had a severe reaction to latex? no    Vaccine information given and explained to patient? yes    Are you currently pregnant? no    Lot Number:[id number]   Exp Date:04/27/2011   Site Given Right Deltoid IM Lanier Prude, [facility])  September 04, 2010 1:21 PM

## 2010-11-27 NOTE — Assessment & Plan Note (Signed)
Summary: 3 MOS F/U / #/ CD   Vital Signs:  Patient profile:   69 year old female Height:      62 inches Weight:      176 pounds BMI:     32.31 O2 Sat:      97 % on Room air Temp:     98.4 degrees F oral Pulse rate:   80 / minute Pulse rhythm:   regular Resp:     16 per minute BP sitting:   114 / 80  (left arm) Cuff size:   regular  Vitals Entered By: Lanier Prude, CMA(AAMA) (May 21, 2010 9:49 AM)  O2 Flow:  Room air CC: 3 mo f/u Is Patient Diabetic? Yes   Primary Care Provider:  Jacinta Shoe, MD  CC:  3 mo f/u.  History of Present Illness: The patient presents for a follow up of valvular heart disease,  hypertension, diabetes, hyperlipidemia C/o L 3d  finger triggering   Preventive Screening-Counseling & Management  Caffeine-Diet-Exercise     Does Patient Exercise: yes  Current Medications (verified): 1)  Furosemide 40 Mg Tabs (Furosemide) .... Take One Tablet Daily 2)  Warfarin Sodium 10 Mg Tabs (Warfarin Sodium) .... Use As Directed By Anticoagulation Clinic 3)  Coumadin 5 Mg Tabs (Warfarin Sodium) .... As Dirrected 4)  Ranitidine Hcl 300 Mg Tabs (Ranitidine Hcl) .Marland Kitchen.. 1 Po  Once Daily As Needed 5)  Skelaxin 800 Mg Tabs (Metaxalone) .... As Needed 6)  Klor-Con M20 20 Meq  Tbcr (Potassium Chloride Crys Cr) .... Once Daily 7)  Lanoxin 0.125 Mg Tabs (Digoxin) .Marland Kitchen.. 1 Tab Daily 8)  Cartia Xt 240 Mg  Cp24 (Diltiazem Hcl Coated Beads) .Marland Kitchen.. 1 By Mouth Qd 9)  Vitamin D3 1000 Unit  Tabs (Cholecalciferol) .Marland Kitchen.. 1 Qd 10)  Accu-Chek Aviva  Strp (Glucose Blood) .... Tests Once Daily Dx 250.00 11)  Pennsaid 1.5 % Soln (Diclofenac Sodium) .... 3-5 Gtt On Skin Three Times A Day For Pain 12)  Fish Oil 1000 Mg Caps (Omega-3 Fatty Acids) .... Take 1 Capsule By Mouth Once A Day 13)  Glucosamine-Chondroitin 500-400 Mg Caps (Glucosamine-Chondroitin) .... Take One By Mouth Once Daily 14)  Miralax   Powd (Polyethylene Glycol 3350) .... As Per Prep  Instructions. 15)  Reglan 10 Mg   Tabs (Metoclopramide Hcl) .... As Per Prep Instructions. 16)  Dulcolax 5 Mg  Tbec (Bisacodyl) .... Day Before Procedure Take 2 At 3pm and 2 At 8pm.  Allergies (verified): 1)  ! Sulfadiazine (Sulfadiazine) 2)  ! * Fish 3)  ! Quinine 4)  ! Fernand Parkins  Past History:  Past Medical History: Last updated: 03/13/2010 Current Problems:    ATRIAL FIBRILLATION (ICD-427.31) ANTICOAGULATION THERAPY (ICD-V58.61) HYPERTENSION (ICD-401.9) CHEST PAIN (ICD-786.50) HEMATOMA (ICD-924.9) DIVERTICULOSIS, COLON (ICD-562.10) PARESTHESIA (ICD-782.0) CATARACT, SENILE NOS (ICD-366.10) SOFT TISSUE DISORDER, MOUTH (ICD-528.9) LEG PAIN (ICD-729.5) SWEATING (ICD-780.8) ALLERGIC RHINITIS (ICD-477.9) LOW BACK PAIN (ICD-724.2) VITAMIN D DEFICIENCY (ICD-268.9) DIABETES MELLITUS, TYPE II (ICD-250.00) GERD (ICD-530.81)  St Jude valve  mitral  valve prosthesis  Past Surgical History: Last updated: 05/14/2010 Valve surgery 1995 Hysterectomy cholecystectomy Mitral valve replacement.  Social History: Last updated: 05/21/2010 Occupation:Retired Married Former Smoker Regular exercise-yes, bowling Alcohol Use - no Illicit Drug Use - no Daily Caffeine Use rare Regular exercise-yes  Social History: Occupation:Retired Married Former Smoker Regular exercise-yes, bowling Alcohol Use - no Illicit Drug Use - no Daily Caffeine Use rare Regular exercise-yes  Review of Systems  The patient denies fever, weight loss, weight gain, chest  pain, and dyspnea on exertion.    Physical Exam  General:  well-developed and overweight-appearing.   Nose:  External nasal examination shows no deformity or inflammation. Nasal mucosa are pink and moist without lesions or exudates. Mouth:  Oral mucosa and oropharynx without lesions or exudates.  Teeth in good repair. Neck:  no carotid bruit or thyromegaly.neck  Lungs:  normal respiratory effort, no intercostal retractions or use of accessory muscles; normal breath  sounds bilaterally - no crackles and no wheezes.    Heart:  normal rate, regular rhythm, no murmur, and no rub. BLE without edema. Abdomen:  S/NT Msk:  No deformity or scoliosis noted of thoracic or lumbar spine.  Flexible LS. 3d L finger is triggering Neurologic:  No cranial nerve deficits noted. Station and gait are normal. Plantar reflexes are down-going bilaterally. DTRs are symmetrical throughout. Sensory, motor and coordinative functions appear intact. Romberg (-) Heel to toe nl Skin:  large 3 cm intact, square shapped, blister on central top of left foot - no redness or cellulitis Psych:  Cognition and judgment appear intact. Alert and cooperative with normal attention span and concentration. No apparent delusions, illusions, hallucinations   Impression & Recommendations:  Problem # 1:  TRIGGER FINGER (ICD-727.03) L 3d Assessment New [facility] if not better Orders: TLB-BMP (Basic Metabolic Panel-BMET) (80048-METABOL) TLB-A1C / Hgb A1C (Glycohemoglobin) (83036-A1C) TLB-TSH (Thyroid Stimulating Hormone) (84443-TSH)  Problem # 2:  ANTICOAGULATION THERAPY (ICD-V58.61) Assessment: Unchanged On Rx  Problem # 3:  HYPERTENSION (ICD-401.9) Assessment: Unchanged  Her updated medication list for this problem includes:    Furosemide 40 Mg Tabs (Furosemide) .Marland Kitchen... Take one tablet daily    Cartia Xt 240 Mg Cp24 (Diltiazem hcl coated beads) .Marland Kitchen... 1 by mouth qd  Orders: TLB-BMP (Basic Metabolic Panel-BMET) (80048-METABOL) TLB-A1C / Hgb A1C (Glycohemoglobin) (83036-A1C) TLB-TSH (Thyroid Stimulating Hormone) (84443-TSH)  Problem # 4:  DIABETES MELLITUS, TYPE II (ICD-250.00) Assessment: Unchanged  Complete Medication List: 1)  Furosemide 40 Mg Tabs (Furosemide) .... Take one tablet daily 2)  Warfarin Sodium 10 Mg Tabs (Warfarin sodium) .... Use as directed by anticoagulation clinic 3)  Coumadin 5 Mg Tabs (Warfarin sodium) .... As dirrected 4)  Ranitidine Hcl 300 Mg Tabs (Ranitidine  hcl) .Marland Kitchen.. 1 po  once daily as needed 5)  Skelaxin 800 Mg Tabs (Metaxalone) .... As needed 6)  Klor-con M20 20 Meq Tbcr (Potassium chloride crys cr) .... Once daily 7)  Lanoxin 0.125 Mg Tabs (Digoxin) .Marland Kitchen.. 1 tab daily 8)  Cartia Xt 240 Mg Cp24 (Diltiazem hcl coated beads) .Marland Kitchen.. 1 by mouth qd 9)  Vitamin D3 1000 Unit Tabs (Cholecalciferol) .Marland Kitchen.. 1 qd 10)  Accu-chek Aviva Strp (Glucose blood) .... Tests once daily dx 250.00 11)  Pennsaid 1.5 % Soln (Diclofenac sodium) .... 3-5 gtt on skin three times a day for pain 12)  Fish Oil 1000 Mg Caps (Omega-3 fatty acids) .... Take 1 capsule by mouth once a day 13)  Glucosamine-chondroitin 500-400 Mg Caps (Glucosamine-chondroitin) .... Take one by mouth once daily 14)  Miralax Powd (Polyethylene glycol 3350) .... As per prep  instructions. 15)  Reglan 10 Mg Tabs (Metoclopramide hcl) .... As per prep instructions. 16)  Dulcolax 5 Mg Tbec (Bisacodyl) .... Day before procedure take 2 at 3pm and 2 at 8pm. 17)  Loratadine 10 Mg Tabs (Loratadine) .Marland Kitchen.. 1 by mouth once daily as needed allergies  Patient Instructions: 1)  Please schedule a follow-up appointment in 4 months. Prescriptions: LORATADINE 10 MG TABS (LORATADINE) 1 by mouth  once daily as needed allergies  #30 x 6   Entered and Authorized by:   Tresa Garter MD   Signed by:   Tresa Garter MD on 05/21/2010   Method used:   Print then Give to Patient   RxID:   [id number] PENNSAID 1.5 % SOLN (DICLOFENAC SODIUM) 3-5 gtt on skin three times a day for pain  #1 x 3   Entered and Authorized by:   Tresa Garter MD   Signed by:   Tresa Garter MD on 05/21/2010   Method used:   Print then Give to Patient   RxID:   [id number]

## 2010-11-27 NOTE — Assessment & Plan Note (Signed)
Summary: SORE THROAT/NWS   Vital Signs:  Patient profile:   70 year old female Height:      62 inches Weight:      177 pounds BMI:     32.49 O2 Sat:      98 % on Room air Temp:     97.5 degrees F oral Pulse rate:   80 / minute Pulse rhythm:   regular Resp:     16 per minute BP sitting:   108 / 70  (left arm) Cuff size:   regular  Vitals Entered By: Lanier Prude, CMA(AAMA) (June 04, 2010 4:44 PM)  O2 Flow:  Room air CC: sore throat/loose stools x 3 days Is Patient Diabetic? Yes   Primary Care Varnell Orvis:  Jacinta Shoe, MD  CC:  sore throat/loose stools x 3 days.  History of Present Illness: C/o ST Started with 1 loose stool x 1 today  Current Medications (verified): 1)  Furosemide 40 Mg Tabs (Furosemide) .... Take One Tablet Daily 2)  Warfarin Sodium 10 Mg Tabs (Warfarin Sodium) .... Use As Directed By Anticoagulation Clinic 3)  Coumadin 5 Mg Tabs (Warfarin Sodium) .... As Dirrected 4)  Ranitidine Hcl 300 Mg Tabs (Ranitidine Hcl) .Marland Kitchen.. 1 Po  Once Daily As Needed 5)  Skelaxin 800 Mg Tabs (Metaxalone) .... As Needed 6)  Klor-Con M20 20 Meq  Tbcr (Potassium Chloride Crys Cr) .... Once Daily 7)  Lanoxin 0.125 Mg Tabs (Digoxin) .Marland Kitchen.. 1 Tab Daily 8)  Cartia Xt 240 Mg  Cp24 (Diltiazem Hcl Coated Beads) .Marland Kitchen.. 1 By Mouth Qd 9)  Vitamin D3 1000 Unit  Tabs (Cholecalciferol) .Marland Kitchen.. 1 Qd 10)  Accu-Chek Aviva  Strp (Glucose Blood) .... Tests Once Daily Dx 250.00 11)  Pennsaid 1.5 % Soln (Diclofenac Sodium) .... 3-5 Gtt On Skin Three Times A Day For Pain 12)  Fish Oil 1000 Mg Caps (Omega-3 Fatty Acids) .... Take 1 Capsule By Mouth Once A Day 13)  Glucosamine-Chondroitin 500-400 Mg Caps (Glucosamine-Chondroitin) .... Take One By Mouth Once Daily 14)  Miralax   Powd (Polyethylene Glycol 3350) .... As Per Prep  Instructions. 15)  Reglan 10 Mg  Tabs (Metoclopramide Hcl) .... As Per Prep Instructions. 16)  Dulcolax 5 Mg  Tbec (Bisacodyl) .... Day Before Procedure Take 2 At 3pm and 2 At  8pm. 17)  Loratadine 10 Mg Tabs (Loratadine) .Marland Kitchen.. 1 By Mouth Once Daily As Needed Allergies  Allergies (verified): 1)  ! Sulfadiazine (Sulfadiazine) 2)  ! * Fish 3)  ! Quinine 4)  ! Fernand Parkins  Past History:  Past Medical History: Last updated: 03/13/2010 Current Problems:    ATRIAL FIBRILLATION (ICD-427.31) ANTICOAGULATION THERAPY (ICD-V58.61) HYPERTENSION (ICD-401.9) CHEST PAIN (ICD-786.50) HEMATOMA (ICD-924.9) DIVERTICULOSIS, COLON (ICD-562.10) PARESTHESIA (ICD-782.0) CATARACT, SENILE NOS (ICD-366.10) SOFT TISSUE DISORDER, MOUTH (ICD-528.9) LEG PAIN (ICD-729.5) SWEATING (ICD-780.8) ALLERGIC RHINITIS (ICD-477.9) LOW BACK PAIN (ICD-724.2) VITAMIN D DEFICIENCY (ICD-268.9) DIABETES MELLITUS, TYPE II (ICD-250.00) GERD (ICD-530.81)  St Jude valve  mitral  valve prosthesis  Review of Systems       The patient complains of fever.  The patient denies prolonged cough and headaches.    Physical Exam  General:  well-developed and overweight-appearing.   Ears:  External ear exam shows no significant lesions or deformities.  Otoscopic examination reveals clear canals, tympanic membranes are intact bilaterally without bulging, retraction, inflammation or discharge. Hearing is grossly normal bilaterally. Nose:  External nasal examination shows no deformity or inflammation. Nasal mucosa are pink and moist without lesions or exudates. Mouth:  Erythematous throat and intranasal mucosa c/w URI  Tonsils w/exssudate B Lungs:  normal respiratory effort, no intercostal retractions or use of accessory muscles; normal breath sounds bilaterally - no crackles and no wheezes.    Heart:  normal rate, regular rhythm, no murmur, and no rub. BLE without edema. Cervical Nodes:  B tonsilar LNs are enlarged   Impression & Recommendations:  Problem # 1:  PHARYNGITIS, ACUTE (ICD-462) - poss Strep Assessment New Z pac OTC meds prn  Refused a strep  test  Problem # 2:  ANTICOAGULATION THERAPY  (ICD-V58.61) Assessment: Unchanged Check INR sooner  Complete Medication List: 1)  Furosemide 40 Mg Tabs (Furosemide) .... Take one tablet daily 2)  Warfarin Sodium 10 Mg Tabs (Warfarin sodium) .... Use as directed by anticoagulation clinic 3)  Coumadin 5 Mg Tabs (Warfarin sodium) .... As dirrected 4)  Ranitidine Hcl 300 Mg Tabs (Ranitidine hcl) .Marland Kitchen.. 1 po  once daily as needed 5)  Skelaxin 800 Mg Tabs (Metaxalone) .... As needed 6)  Klor-con M20 20 Meq Tbcr (Potassium chloride crys cr) .... Once daily 7)  Lanoxin 0.125 Mg Tabs (Digoxin) .Marland Kitchen.. 1 tab daily 8)  Cartia Xt 240 Mg Cp24 (Diltiazem hcl coated beads) .Marland Kitchen.. 1 by mouth qd 9)  Vitamin D3 1000 Unit Tabs (Cholecalciferol) .Marland Kitchen.. 1 qd 10)  Accu-chek Aviva Strp (Glucose blood) .... Tests once daily dx 250.00 11)  Pennsaid 1.5 % Soln (Diclofenac sodium) .... 3-5 gtt on skin three times a day for pain 12)  Fish Oil 1000 Mg Caps (Omega-3 fatty acids) .... Take 1 capsule by mouth once a day 13)  Glucosamine-chondroitin 500-400 Mg Caps (Glucosamine-chondroitin) .... Take one by mouth once daily 14)  Miralax Powd (Polyethylene glycol 3350) .... As per prep  instructions. 15)  Reglan 10 Mg Tabs (Metoclopramide hcl) .... As per prep instructions. 16)  Dulcolax 5 Mg Tbec (Bisacodyl) .... Day before procedure take 2 at 3pm and 2 at 8pm. 17)  Loratadine 10 Mg Tabs (Loratadine) .Marland Kitchen.. 1 by mouth once daily as needed allergies 18)  Zithromax Z-pak 250 Mg Tabs (Azithromycin) .... As dirrected  Patient Instructions: 1)  Call if you are not better in a reasonable amount of time or if worse.  Prescriptions: ZITHROMAX Z-PAK 250 MG TABS (AZITHROMYCIN) as dirrected  #1 x 0   Entered and Authorized by:   Tresa Garter MD   Signed by:   Tresa Garter MD on 06/04/2010   Method used:   Print then Give to Patient   RxID:   [id number] ZITHROMAX Z-PAK 250 MG TABS (AZITHROMYCIN) as dirrected  #1 x 0   Entered and Authorized by:   Tresa Garter MD   Signed by:   Tresa Garter MD on 06/04/2010   Method used:   Electronically to        [facility] [address] [facility] (retail)       [address]       [place], [place]  [id number]       Ph: [phone]       Fax: [phone]   RxID:   [id number]

## 2010-11-27 NOTE — Medication Information (Signed)
Summary: Coumadin Clinic  Anticoagulant Therapy  Managed by: Cloyde Reams, RN, BSN Referring MD: Charlton Haws MD PCP: Jacinta Shoe, MD Supervising MD: Jens Som MD, Arlys John Indication 1: Atrial Fibrillation (ICD-427.31) Indication 2: St. Jude Valve Type (ICD-SJV) Valve Position: Mitral Lab Used: [facility] Site: [facility] INR POC 3.5 INR RANGE 3 - 3.5  Dietary changes: no    Health status changes: no    Bleeding/hemorrhagic complications: no    Recent/future hospitalizations: yes       Details: Pending colonoscopy on 07/11/10 with Dr Juanda Chance, needs Lovenox bridge per Dr [name].   Any changes in medication regimen? yes       Details: New supplements DHEA, Biotin.  Recent/future dental: no  Any missed doses?: no       Is patient compliant with meds? yes      Comments: Pt states she has been taking 12.5mg  daily except 10mg  MWF.  Pt's weight is 176. Eda Keys reviewed Lovenox bridge instructions and agrees with plan.  Allergies: 1)  ! Sulfadiazine (Sulfadiazine) 2)  ! * Fish 3)  ! Quinine 4)  ! * Bananas  Anticoagulation Management History:      The patient is taking warfarin and comes in today for a routine follow up visit.  Positive risk factors for bleeding include an age of 72 years or older, history of CVA/TIA, and presence of serious comorbidities.  The bleeding index is 'high risk'.  Positive CHADS2 values include History of HTN, History of Diabetes, and Prior Stroke/CVA/TIA.  Negative CHADS2 values include Age > 20 years old.  The start date was 01/02/1998.  Her last INR was 1.9 ratio.  Anticoagulation responsible provider: Jens Som MD, Arlys John.  INR POC: 3.5.  Cuvette Lot#: [id number].  Exp: 08/2011.    Anticoagulation Management Assessment/Plan:      The patient's current anticoagulation dose is Warfarin sodium 10 mg tabs: Use as directed by Anticoagulation Clinic, Coumadin 5 mg tabs: as dirrected.  The target INR is 3 - 3.5.  The next INR is due 07/16/2010.   Anticoagulation instructions were given to patient.  Results were reviewed/authorized by Cloyde Reams, RN, BSN.  She was notified by Cloyde Reams RN.         Prior Anticoagulation Instructions: INR 2.8  Today, Monday, August 22nd, take Coumadin 2 tabs (20 mg). Then, take Coumadin 10 mg on Mon and Fri. and Coumadin 12.5 mg on all other days (Sun, Tues, Wed, Thur, Sat). Return to clinic in 3 weeks prior to colonoscopy for Lovenox bridging instructions.   Current Anticoagulation Instructions: INR 3.5  Take 12.5mg  today, then hold Coumadin Starting 07/06/10 for Colonoscopy on 07/11/10. No Coumadin or Lovenox on 07/06/10.  Begin Lovenox 80mg  every 12 hrs in the am on 07/07/10.  Continue Lovenox twice daily last dose on 07/10/10 in the am.  Colonoscopy on 07/11/10. Restart Lovenox twice daily and Coumadin once daily after procedure per Dr Regino Schultze instructions.    Pt is concerned about price of Lovenox and unable to afford.  Pt is going to check with pharmacy and contact us back if she cannot afford the Lovenox, may have to be hospitalized for procedure.  Prescriptions: ENOXAPARIN SODIUM 80 MG/0.8ML SOLN (ENOXAPARIN SODIUM) Inject 80mg  subcutaneously every 12 hrs as instructed  #20 x 0   Entered by:   Cloyde Reams RN   Authorized by:   Colon Branch, MD, [facility]   Signed by:   Cloyde Reams RN on 07/05/2010   Method used:   Electronically  to        [facility] (retail)       [address]       [place], [place]  [id number]       Ph: [phone]       Fax: [phone]   RxID:   [id number]

## 2010-11-27 NOTE — Medication Information (Signed)
Summary: rov/tm  Anticoagulant Therapy  Managed by: Bethena Midget, RN, BSN Referring MD: Charlton Haws MD PCP: Jacinta Shoe, MD Supervising MD: Shirlee Latch MD, Ryllie Nieland Indication 1: Atrial Fibrillation (ICD-427.31) Indication 2: St. Jude Valve Type (ICD-SJV) Valve Position: Mitral Lab Used: [facility] Site: [facility] INR POC 4.6 INR RANGE 3 - 3.5  Dietary changes: yes       Details: less green leafy veggies  Health status changes: no    Bleeding/hemorrhagic complications: no    Recent/future hospitalizations: no    Any changes in medication regimen? no    Recent/future dental: no  Any missed doses?: no       Is patient compliant with meds? yes      Comments: Pending Colonoscopy in July.   Allergies: 1)  ! Sulfadiazine (Sulfadiazine) 2)  ! * Fish 3)  ! Quinine 4)  ! * Bananas  Anticoagulation Management History:      The patient is taking warfarin and comes in today for a routine follow up visit.  Positive risk factors for bleeding include an age of 70 years or older, history of CVA/TIA, and presence of serious comorbidities.  The bleeding index is 'high risk'.  Positive CHADS2 values include History of HTN, History of Diabetes, and Prior Stroke/CVA/TIA.  Negative CHADS2 values include Age > 64 years old.  The start date was 01/02/1998.  Her last INR was 1.9 ratio.  Anticoagulation responsible provider: Shirlee Latch MD, Bion Todorov.  INR POC: 4.6.  Cuvette Lot#: [id number].  Exp: 02/2011.    Anticoagulation Management Assessment/Plan:      The patient's current anticoagulation dose is Warfarin sodium 10 mg tabs: Use as directed by Anticoagulation Clinic, Coumadin 5 mg tabs: as dirrected.  The target INR is 3 - 3.5.  The next INR is due 02/26/2010.  Anticoagulation instructions were given to patient.  Results were reviewed/authorized by Bethena Midget, RN, BSN.  She was notified by Bethena Midget, RN, BSN.         Prior Anticoagulation Instructions: INR 2.9 Today take 15mg s then resume  10mg s daily except 12.5mg   on Mondays, Wednesdays and Fridays. Recheck in 2 weeks.   Current Anticoagulation Instructions: INR 4.6 Skip today's dose. Continue 10mg s daily except 12.5mg s on Mondays, Wednesdays, and Fridays. Recheck in 2 weeks.

## 2010-11-27 NOTE — Progress Notes (Signed)
Summary: Triage  Phone Note Call from Patient Call back at Home Phone [phone]   Caller: Patient Call For: Dr. Juanda Chance Reason for Call: Talk to Nurse Summary of Call: Requsting to speak directly with nurse about her COL on 07-11-10 Initial call taken by: Karna Christmas,  July 05, 2010 10:41 AM  Follow-up for Phone Call        Patient  is unable to afford her portion of the Lovenox which is $350.00 ( after the insurance pays).  She is asking if there is an alternative.  Dr Juanda Chance please advise Follow-up by: Darcey Nora RN, CGRN,  July 05, 2010 11:27 AM  Additional Follow-up for Phone Call Additional follow up Details #1::        The cheapest way to do it is on Coumadin. If we continue Coumadin, she will not have to be bridged with Lovanox.Since this is a screening colonoscopy, we are nor assuming shw is going to have a large polyp. She needs to understand the potential limitation of a colonoscopy  on Coumadin.  Additional Follow-up by: Hart Carwin MD,  July 05, 2010 12:33 PM    Additional Follow-up for Phone Call Additional follow up Details #2::    I reviewed with the patient we can proceed with the procedure while on coumadin, and she understands if there a re large polyps they will not be able to be removed.  Patient  wants to postpone the procedure for now until she can afford Lovenox injections.  Patient will call me back when she is ready to schedule.  Dr Juanda Chance aware.  I have canceled her from the schedule. Follow-up by: Darcey Nora RN, CGRN,  July 05, 2010 1:07 PM

## 2010-11-27 NOTE — Medication Information (Signed)
Summary: rov/sp  Anticoagulant Therapy  Managed by: Weston Brass, PharmD Referring MD: Charlton Haws MD PCP: Jacinta Shoe, MD Supervising MD: Connye Burkitt Indication 1: Atrial Fibrillation (ICD-427.31) Indication 2: St. Jude Valve Type (ICD-SJV) Valve Position: Mitral Lab Used: [facility] Site: [facility] INR POC 2.3 INR RANGE 3 - 3.5  Dietary changes: yes       Details: Eating more greens  Health status changes: no    Bleeding/hemorrhagic complications: no    Recent/future hospitalizations: no    Any changes in medication regimen? no    Recent/future dental: no  Any missed doses?: yes     Details: Pt unsure, a dose may have been missed.  Is patient compliant with meds? yes       Allergies: 1)  ! Sulfadiazine (Sulfadiazine) 2)  ! * Fish 3)  ! Quinine 4)  ! * Bananas  Anticoagulation Management History:      The patient is taking warfarin and comes in today for a routine follow up visit.  Positive risk factors for bleeding include an age of 72 years or older, history of CVA/TIA, and presence of serious comorbidities.  The bleeding index is 'high risk'.  Positive CHADS2 values include History of HTN, History of Diabetes, and Prior Stroke/CVA/TIA.  Negative CHADS2 values include Age > 67 years old.  The start date was 01/02/1998.  Her last INR was 1.9 ratio.  Anticoagulation responsible provider: [facility].  INR POC: 2.3.  Cuvette Lot#: [id number].  Exp: 08/2011.    Anticoagulation Management Assessment/Plan:      The patient's current anticoagulation dose is Warfarin sodium 10 mg tabs: Use as directed by Anticoagulation Clinic, Coumadin 5 mg tabs: as dirrected.  The target INR is 3 - 3.5.  The next INR is due 09/19/2010.  Anticoagulation instructions were given to patient.  Results were reviewed/authorized by Weston Brass, PharmD.  She was notified by Hoy Register, PharmD Candidate.         Prior Anticoagulation Instructions: INR 3.0  Continue same dose of 12.5mg  daily except  10mg  on Monday, Wednesday and Friday.  Recheck INR in 4 weeks.   Current Anticoagulation Instructions: INR 2.3  Take 2-10 mg tablets today, then increase dose to 12.5 mg everyday except 10 mg on Monday and Friday   Recheck INR in 2 weeks.

## 2010-11-27 NOTE — Consult Note (Signed)
Summary: [facility]  [facility]   Imported By: Sherian Rein 05/02/2010 14:21:56  _____________________________________________________________________  External Attachment:    Type:   Image     Comment:   External Document

## 2010-11-27 NOTE — Medication Information (Signed)
Summary: rov/tm  Anticoagulant Therapy  Managed by: Bethena Midget, RN, BSN Referring MD: Charlton Haws MD PCP: Jacinta Shoe, MD Supervising MD: Myrtis Ser MD, Tinnie Gens Indication 1: Atrial Fibrillation (ICD-427.31) Indication 2: St. Jude Valve Type (ICD-SJV) Valve Position: Mitral Lab Used: [facility] Site: [facility] INR POC 3.2 INR RANGE 3 - 3.5  Dietary changes: no    Health status changes: no    Bleeding/hemorrhagic complications: no    Recent/future hospitalizations: no    Any changes in medication regimen? yes       Details: Prilosec stared 01/31/10 daily   Recent/future dental: no  Any missed doses?: yes     Details: missed last night's dose  Is patient compliant with meds? yes      Comments: Suggested 3 weeks, but pt only wants 4 weeks due to co-pay.   Allergies: 1)  ! Sulfadiazine (Sulfadiazine) 2)  ! * Fish 3)  ! Quinine 4)  ! * Bananas  Anticoagulation Management History:      The patient is taking warfarin and comes in today for a routine follow up visit.  Positive risk factors for bleeding include an age of 72 years or older, history of CVA/TIA, and presence of serious comorbidities.  The bleeding index is 'high risk'.  Positive CHADS2 values include History of HTN, History of Diabetes, and Prior Stroke/CVA/TIA.  Negative CHADS2 values include Age > 52 years old.  The start date was 01/02/1998.  Her last INR was 1.9 ratio.  Anticoagulation responsible provider: Myrtis Ser MD, Tinnie Gens.  INR POC: 3.2.  Cuvette Lot#: [id number].  Exp: 03/2011.    Anticoagulation Management Assessment/Plan:      The patient's current anticoagulation dose is Warfarin sodium 10 mg tabs: Use as directed by Anticoagulation Clinic, Coumadin 5 mg tabs: as dirrected.  The target INR is 3 - 3.5.  The next INR is due 03/28/2010.  Anticoagulation instructions were given to patient.  Results were reviewed/authorized by Bethena Midget, RN, BSN.  She was notified by Bethena Midget, RN, BSN.         Prior  Anticoagulation Instructions: INR 4.6 Skip today's dose. Continue 10mg s daily except 12.5mg s on Mondays, Wednesdays, and Fridays. Recheck in 2 weeks.   Current Anticoagulation Instructions: INR 3.2 Today take 15mg s then resume 10mg s everyday except 12.5mg s 0n Mondays, Wednesdays and Fridays. Recheck in 4 weeks.

## 2010-11-27 NOTE — Miscellaneous (Signed)
Summary: Orders Update  Clinical Lists Changes  Orders: Added new Test order of Carotid Duplex (Carotid Duplex) - Signed 

## 2010-11-27 NOTE — Assessment & Plan Note (Signed)
Summary: FU/NWS $50 /NWS   Vital Signs:  Patient Profile:   70 Years Old Female Weight:      185 pounds Temp:     97.8 degrees F oral Pulse rate:   76 / minute BP sitting:   110 / 76  (left arm)  Vitals Entered By: Tora Perches (June 28, 2008 2:29 PM)                 Chief Complaint:  Multiple medical problems or concerns.  History of Present Illness: C/o ulcer in mouth 2-3 mo.  R knee pain after got out of chair last wk C/o cataracts    Current Allergies (reviewed today): LANOXIN (DIGOXIN)  Past Medical History:    Reviewed history from 02/15/2008 and no changes required:       Anticoagulation therapy       Atrial fibrillation       St Jude valve       GERD       Vit D def        Diabetes mellitus, type II   2008 on diet now       Low back pain       Allergic rhinitis   Family History:    Reviewed history from 10/14/2007 and no changes required:       Family History Hypertension  Social History:    Reviewed history from 10/14/2007 and no changes required:       Occupation:       Married       Former Smoker       Regular exercise-yes, bowling   Risk Factors:  Exercise:  yes   Review of Systems  The patient denies fever, chest pain, syncope, and dyspnea on exertion.     Physical Exam  General:     NAD Head:     Normocephalic and atraumatic without obvious abnormalities. No apparent alopecia or balding. Nose:     External nasal examination shows no deformity or inflammation. Nasal mucosa are pink and moist without lesions or exudates. Mouth:     L hard palate with a 6 mm granuloma Neck:     No deformities, masses, or tenderness noted. Lungs:     Normal respiratory effort, chest expands symmetrically. Lungs are clear to auscultation, no crackles or wheezes. Heart:     Reg Abdomen:     Bowel sounds positive,abdomen soft and non-tender without masses, organomegaly or hernias noted. Msk:     B knee NT, not swowellig Neurologic:  No cranial nerve deficits noted. Station and gait are normal. Plantar reflexes are down-going bilaterally. DTRs are symmetrical throughout. Sensory, motor and coordinative functions appear intact.    Impression & Recommendations:  Problem # 1:  LEG PAIN (ICD-729.5) R  Assessment: New Baker cyst is a likely cause, improved  Problem # 2:  SOFT TISSUE DISORDER, MOUTH (ICD-528.9) Assessment: New R/o CA Orders: Misc. Referral (Misc. Ref)   Problem # 3:  CATARACT, SENILE NOS (ICD-366.10) Assessment: Deteriorated  Orders: Ophthalmology Referral (Ophthalmology)   Problem # 4:  DIABETES MELLITUS, TYPE II (ICD-250.00) Assessment: Unchanged On diet  Complete Medication List: 1)  Furosemide 40 Mg Tabs (Furosemide) .... Take one tablet daily 2)  Coumadin 10 Mg Tabs (Warfarin sodium) .... As dirrect. 3)  Coumadin 5 Mg Tabs (Warfarin sodium) .... As dirrected 4)  Ranitidine Hcl 300 Mg Tabs (Ranitidine hcl) .Marland Kitchen.. 1 po qd 5)  Skelaxin 800 Mg Tabs (Metaxalone) .... Take 1 by mouth  two times a day 6)  Klor-con M20 20 Meq Tbcr (Potassium chloride crys cr) .... Once daily 7)  Lanoxin 0.125 Mg Tabs (Digoxin) .Marland Kitchen.. 1 tab daily 8)  Cartia Xt 240 Mg Cp24 (Diltiazem hcl coated beads) .Marland Kitchen.. 1 by mouth qd 9)  Loratadine 10 Mg Tabs (Loratadine) .... Once daily as needed allergies 10)  Vitamin D3 1000 Unit Tabs (Cholecalciferol) .Marland Kitchen.. 1 qd 11)  Darvocet-n 100 100-650 Mg Tabs (Propoxyphene n-apap) .Marland Kitchen.. 1 by mouth two times a day as needed pain   Patient Instructions: 1)  Please schedule a follow-up appointment in 3 months.   Prescriptions: DARVOCET-N 100 100-650 MG TABS (PROPOXYPHENE N-APAP) 1 by mouth two times a day as needed pain  #60 x 0   Entered and Authorized by:   Tresa Garter MD   Signed by:   Tresa Garter MD on 06/28/2008   Method used:   Print then Give to Patient   RxID:   [id number]  ]
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
## 2010-11-27 NOTE — Assessment & Plan Note (Signed)
Summary: FLU SYMPTOMS PER SARAH SCHED  STC   Vital Signs:  Patient profile:   70 year old female Weight:      186 pounds O2 Sat:      97 % on Room air Temp:     98.5 degrees F oral Pulse rate:   86 / minute BP sitting:   102 / 60  (left arm)  Vitals Entered By: Doristine Devoid (December 16, 2009 9:51 AM)  O2 Flow:  Room air   Acute Visit History:      The patient complains of abdominal pain, fever, nasal discharge, and nausea.  These symptoms began 5 days ago.  She denies cough, earache, sinus problems, sore throat, and vomiting.  Other comments include: chills, fatigue, congestion body ache initially.Marland Kitchengone now episode of diarrhea now,  consitpation in last few days.        Her highest temperature has been 102.  This temperature was recorded daily until today.        Problems Prior to Update: 1)  Uri  (ICD-465.9) 2)  Uti  (ICD-599.0) 3)  Constipation  (ICD-564.00) 4)  Tobacco Use, Quit  (ICD-V15.82) 5)  Mitral Valve Replacement, Hx of  (ICD-V15.1) 6)  Aortic Valve Disorders  (ICD-424.1) 7)  Carotid Stenosis  (ICD-433.10) 8)  Atrial Fibrillation  (ICD-427.31) 9)  Anticoagulation Therapy  (ICD-V58.61) 10)  Hypertension  (ICD-401.9) 11)  Chest Pain  (ICD-786.50) 12)  Hematoma  (ICD-924.9) 13)  Diverticulosis, Colon  (ICD-562.10) 14)  Paresthesia  (ICD-782.0) 15)  Cataract, Senile Nos  (ICD-366.10) 16)  Soft Tissue Disorder, Mouth  (ICD-528.9) 17)  Leg Pain  (ICD-729.5) 18)  Sweating  (ICD-780.8) 19)  Allergic Rhinitis  (ICD-477.9) 20)  Low Back Pain  (ICD-724.2) 21)  Vitamin D Deficiency  (ICD-268.9) 22)  Diabetes Mellitus, Type II  (ICD-250.00) 23)  Gerd  (ICD-530.81)  Current Medications (verified): 1)  Furosemide 40 Mg Tabs (Furosemide) .... Take One Tablet Daily 2)  Coumadin 10 Mg  Tabs (Warfarin Sodium) .... 6 Mg 6 Times A Week and 5mg  One Time A Week 3)  Coumadin 5 Mg Tabs (Warfarin Sodium) .... As Dirrected 4)  Ranitidine Hcl 300 Mg Tabs (Ranitidine Hcl) .Marland Kitchen.. 1  Po  Once Daily As Needed 5)  Skelaxin 800 Mg Tabs (Metaxalone) .... As Needed 6)  Klor-Con M20 20 Meq  Tbcr (Potassium Chloride Crys Cr) .... Once Daily 7)  Lanoxin 0.125 Mg Tabs (Digoxin) .Marland Kitchen.. 1 Tab Daily 8)  Cartia Xt 240 Mg  Cp24 (Diltiazem Hcl Coated Beads) .Marland Kitchen.. 1 By Mouth Qd 9)  Vitamin D3 1000 Unit  Tabs (Cholecalciferol) .Marland Kitchen.. 1 Qd 10)  Darvocet-N 100 100-650 Mg Tabs (Propoxyphene N-Apap) .Marland Kitchen.. 1 By Mouth Two Times A Day As Needed Pain 11)  Accu-Chek Aviva  Strp (Glucose Blood) .... Tests Once Daily Dx 250.00 12)  Pennsaid 1.5 % Soln (Diclofenac Sodium) .... 3-5 Gtt On Skin Three Times A Day For Pain  Allergies (verified): 1)  ! Sulfadiazine (Sulfadiazine) 2)  ! * Fish 3)  ! Quinine 4)  ! * Bananas  Past History:  Past medical, surgical, family and social histories (including risk factors) reviewed, and no changes noted (except as noted below).  Past Medical History: Reviewed history from 04/27/2009 and no changes required. Current Problems:  ATRIAL FIBRILLATION (ICD-427.31) ANTICOAGULATION THERAPY (ICD-V58.61) HYPERTENSION (ICD-401.9) CHEST PAIN (ICD-786.50) HEMATOMA (ICD-924.9) DIVERTICULOSIS, COLON (ICD-562.10) PARESTHESIA (ICD-782.0) CATARACT, SENILE NOS (ICD-366.10) SOFT TISSUE DISORDER, MOUTH (ICD-528.9) LEG PAIN (ICD-729.5) SWEATING (ICD-780.8) ALLERGIC RHINITIS (ICD-477.9) LOW BACK  PAIN (ICD-724.2) VITAMIN D DEFICIENCY (ICD-268.9) DIABETES MELLITUS, TYPE II (ICD-250.00) GERD (ICD-530.81)  St Jude valve  mitral  valve prosthesis  Past Surgical History: Reviewed history from 04/27/2009 and no changes required. Valve surgery 1995 Hysterectomy cholecystectomy  Mitral valve replacement.  Family History: Reviewed history from 02/15/2009 and no changes required. Family History Hypertension Family History of Diabetes: Mother, Brother Family History of Heart Disease: Brother No FH of Colon Cancer: Family History of Pancreatic Cancer:Brother  Social  History: Reviewed history from 02/15/2009 and no changes required. Occupation:Retired Married Former Smoker Regular exercise-yes, bowling Alcohol Use - no Illicit Drug Use - no  Review of Systems General:  Denies fatigue. CV:  Denies chest pain or discomfort. Resp:  Denies shortness of breath. GI:  Denies bloody stools. GU:  Denies abnormal vaginal bleeding and dysuria.  Physical Exam  General:  no toxic appearing female iN NAd Head:  no maxilalry sinus ttp Ears:  clear fluid B TMS Nose:  nasal discharge, no mucosal pallor.   Mouth:  MMM, post nasal drip Neck:  no carotid bruit or thyromegaly.neck  Lungs:  Normal respiratory effort, chest expands symmetrically. Lungs are clear to auscultation, no crackles or wheezes. Heart:  Normal rate and regular rhythm. S1 and S2 normal without gallop, murmur, click, rub or other extra sounds. Abdomen:  mildly decrease bowel sounds, diffuse abdominal pain, greatest in B lower quadrants, no rebound, no guarding, soft.   Pulses:  R and L posterior tibial pulses are full and equal bilaterally  Extremities:  no edema    Impression & Recommendations:  Problem # 1:  CONSTIPATION (ICD-564.00) Treat with increase in water, miralax as needed Call of not imrpoving pain with BM or if fever continuing with diverticulitis as possibility.   Problem # 2:  URI (ICD-465.9) ? flu .Marland Kitchennow resolving. Fever resolved. out of timeframe for tamiflu to be effective.   Treat with guafenesin, nasal saline irrigation. Call if fever retuens.   Complete Medication List: 1)  Furosemide 40 Mg Tabs (Furosemide) .... Take one tablet daily 2)  Coumadin 10 Mg Tabs (Warfarin sodium) .... 6 mg 6 times a week and 5mg  one time a week 3)  Coumadin 5 Mg Tabs (Warfarin sodium) .... As dirrected 4)  Ranitidine Hcl 300 Mg Tabs (Ranitidine hcl) .Marland Kitchen.. 1 po  once daily as needed 5)  Skelaxin 800 Mg Tabs (Metaxalone) .... As needed 6)  Klor-con M20 20 Meq Tbcr (Potassium chloride crys  cr) .... Once daily 7)  Lanoxin 0.125 Mg Tabs (Digoxin) .Marland Kitchen.. 1 tab daily 8)  Cartia Xt 240 Mg Cp24 (Diltiazem hcl coated beads) .Marland Kitchen.. 1 by mouth qd 9)  Vitamin D3 1000 Unit Tabs (Cholecalciferol) .Marland Kitchen.. 1 qd 10)  Darvocet-n 100 100-650 Mg Tabs (Propoxyphene n-apap) .Marland Kitchen.. 1 by mouth two times a day as needed pain 11)  Accu-chek Aviva Strp (Glucose blood) .... Tests once daily dx 250.00 12)  Pennsaid 1.5 % Soln (Diclofenac sodium) .... 3-5 gtt on skin three times a day for pain  Patient Instructions: 1)  INcrease water intake. 2)  Start guafenesin or mucinex No decongestant. 3)  Start daily miralax over weekend. 4)  Call if abdominal pain not feeling better or having BMs by Monday.

## 2010-11-27 NOTE — Letter (Signed)
Summary: [facility] Instructions  [facility]  [address] [place], [place] [address]   Phone: [phone]  Fax: [phone]       Beth Miller    [birth year]    MRN: [id number]       Procedure Day /Date: 07/11/10 Wednesday     Arrival Time: 7:30 am     Procedure Time: 8:00 am     Location of Procedure:                    _x _  [facility] (4th Floor)  PREPARATION FOR COLONOSCOPY WITH MIRALAX  Starting 5 days prior to your procedure (07/06/10) do not eat nuts, seeds, popcorn, corn, beans, peas,  salads, or any raw vegetables.  Do not take any fiber supplements (e.g. Metamucil, Citrucel, and Benefiber). ____________________________________________________________________________________________________   THE DAY BEFORE YOUR PROCEDURE         DATE: 07/10/10 DAY: Tuesday  1   Drink clear liquids the entire day-NO SOLID FOOD  2   Do not drink anything colored red or purple.  Avoid juices with pulp.  No orange juice.  3   Drink at least 64 oz. (8 glasses) of fluid/clear liquids during the day to prevent dehydration and help the prep work efficiently.  CLEAR LIQUIDS INCLUDE: Water Jello Ice Popsicles Tea (sugar ok, no milk/cream) Powdered fruit flavored drinks Coffee (sugar ok, no milk/cream) Gatorade Juice: apple, white grape, white cranberry  Lemonade Clear bullion, consomm, broth Carbonated beverages (any kind) Strained chicken noodle soup Hard Candy  4   Mix the entire bottle of Miralax with 64 oz. of Gatorade/Powerade in the morning and put in the refrigerator to chill.  5   At 3:00 pm take 2 Dulcolax/Bisacodyl tablets.  6   At 4:30 pm take one Reglan/Metoclopramide tablet.  7  Starting at 5:00 pm drink one 8 oz glass of the Miralax mixture every 15-20 minutes until you have finished drinking the entire 64 oz.  You should finish drinking prep around 7:30 or 8:00 pm.  8   If you are nauseated, you may take the 2nd Reglan/Metoclopramide  tablet at 6:30 pm.        9    At 8:00 pm take 2 more DULCOLAX/Bisacodyl tablets.        THE DAY OF YOUR PROCEDURE      DATE:  07/11/10 DAY: Wednesday  You may drink clear liquids until 6:00 am  (2 HOURS BEFORE PROCEDURE).   MEDICATION INSTRUCTIONS  Unless otherwise instructed, you should take regular prescription medications with a small sip of water as early as possible the morning of your procedure.  We will call you regarding Dr Fabio Bering wishes for your warfarin at the time of your colonoscopy.       OTHER INSTRUCTIONS  You will need a responsible adult at least 72 years of age to accompany you and drive you home.   This person must remain in the waiting room during your procedure.  Wear loose fitting clothing that is easily removed.  Leave jewelry and other valuables at home.  However, you may wish to bring a book to read or an iPod/MP3 player to listen to music as you wait for your procedure to start.  Remove all body piercing jewelry and leave at home.  Total time from sign-in until discharge is approximately 2-3 hours.  You should go home directly after your procedure and rest.  You can resume normal activities the day after  your procedure.  The day of your procedure you should not:   Drive   Make legal decisions   Operate machinery   Drink alcohol   Return to work  You will receive specific instructions about eating, activities and medications before you leave.   The above instructions have been reviewed and explained to me by   _______________________    I fully understand and can verbalize these instructions _____________________________ Date 05/14/10

## 2010-11-27 NOTE — Medication Information (Signed)
Summary: rov/sp  Anticoagulant Therapy  Managed by: Weston Brass, PharmD Referring MD: Charlton Haws MD PCP: Jacinta Shoe, MD Supervising MD: Ladona Ridgel MD, Sharlot Gowda Indication 1: Atrial Fibrillation (ICD-427.31) Indication 2: St. Jude Valve Type (ICD-SJV) Valve Position: Mitral Lab Used: [facility] Site: [facility] INR POC 3.0 INR RANGE 3 - 3.5  Dietary changes: no    Health status changes: no    Bleeding/hemorrhagic complications: no    Recent/future hospitalizations: no    Any changes in medication regimen? no    Recent/future dental: no  Any missed doses?: no       Is patient compliant with meds? yes      Comments: Pt was scheduled to have colonoscopy but this got postponed.  She will let us know when it is rescheduled.  Pt requested 5 week f/u due to ability to pay copays  Allergies: 1)  ! Sulfadiazine (Sulfadiazine) 2)  ! * Fish 3)  ! Quinine 4)  ! * Bananas  Anticoagulation Management History:      The patient is taking warfarin and comes in today for a routine follow up visit.  Positive risk factors for bleeding include an age of 65 years or older, history of CVA/TIA, and presence of serious comorbidities.  The bleeding index is 'high risk'.  Positive CHADS2 values include History of HTN, History of Diabetes, and Prior Stroke/CVA/TIA.  Negative CHADS2 values include Age > 27 years old.  The start date was 01/02/1998.  Her last INR was 1.9 ratio.  Anticoagulation responsible provider: Ladona Ridgel MD, Sharlot Gowda.  INR POC: 3.0.  Cuvette Lot#: [id number].  Exp: 08/2011.    Anticoagulation Management Assessment/Plan:      The patient's current anticoagulation dose is Warfarin sodium 10 mg tabs: Use as directed by Anticoagulation Clinic, Coumadin 5 mg tabs: as dirrected.  The target INR is 3 - 3.5.  The next INR is due 08/30/2010.  Anticoagulation instructions were given to patient.  Results were reviewed/authorized by Weston Brass, PharmD.  She was notified by Weston Brass PharmD.      Prior Anticoagulation Instructions: INR 3.5  Take 12.5mg  today, then hold Coumadin Starting 07/06/10 for Colonoscopy on 07/11/10. No Coumadin or Lovenox on 07/06/10.  Begin Lovenox 80mg  every 12 hrs in the am on 07/07/10.  Continue Lovenox twice daily last dose on 07/10/10 in the am.  Colonoscopy on 07/11/10. Restart Lovenox twice daily and Coumadin once daily after procedure per Dr Regino Schultze instructions.    Pt is concerned about price of Lovenox and unable to afford.  Pt is going to check with pharmacy and contact us back if she cannot afford the Lovenox, may have to be hospitalized for procedure.   Current Anticoagulation Instructions: INR 3.0  Continue same dose of 12.5mg  daily except 10mg  on Monday, Wednesday and Friday.  Recheck INR in 4 weeks.

## 2010-11-27 NOTE — Assessment & Plan Note (Signed)
Summary: REC COL, COUMADIN...AS.   History of Present Illness Visit Type: Follow-up Visit Primary GI MD: Lina Sar MD Primary Provider: Jacinta Shoe, MD Chief Complaint: Patient is due for a recall colonoscopy and is on coumadin she denies any current GI complaints.  History of Present Illness:   70 year old African American female due for screening colonoscopy. We saw her last year for the same reason and delayed the recall to this summer. She do upper endoscopy and colonoscopy by Dr. Randa Evens in 2001 but she denies having be exams. We have obtain records of it from Dr. Randa Evens. She is asymptomatic as far as saw her swallowing is concerned bowel habits rectal bleeding. She had mitral valve replaced and sent to back in 1995 and has had atrial fibrillation. She has been on chronic Coumadin. She is folded Coumadin clinic. He is familiar with the colonoscopy prep because her husband had the colonoscopy within the last year   GI Review of Systems      Denies abdominal pain, acid reflux, belching, bloating, chest pain, dysphagia with liquids, dysphagia with solids, heartburn, loss of appetite, nausea, vomiting, vomiting blood, weight loss, and  weight gain.        Denies anal fissure, black tarry stools, change in bowel habit, constipation, diarrhea, diverticulosis, fecal incontinence, heme positive stool, hemorrhoids, irritable bowel syndrome, jaundice, light color stool, liver problems, rectal bleeding, and  rectal pain.    Current Medications (verified): 1)  Furosemide 40 Mg Tabs (Furosemide) .... Take One Tablet Daily 2)  Warfarin Sodium 10 Mg Tabs (Warfarin Sodium) .... Use As Directed By Anticoagulation Clinic 3)  Coumadin 5 Mg Tabs (Warfarin Sodium) .... As Dirrected 4)  Ranitidine Hcl 300 Mg Tabs (Ranitidine Hcl) .Marland Kitchen.. 1 Po  Once Daily As Needed 5)  Skelaxin 800 Mg Tabs (Metaxalone) .... As Needed 6)  Klor-Con M20 20 Meq  Tbcr (Potassium Chloride Crys Cr) .... Once Daily 7)   Lanoxin 0.125 Mg Tabs (Digoxin) .Marland Kitchen.. 1 Tab Daily 8)  Cartia Xt 240 Mg  Cp24 (Diltiazem Hcl Coated Beads) .Marland Kitchen.. 1 By Mouth Qd 9)  Vitamin D3 1000 Unit  Tabs (Cholecalciferol) .Marland Kitchen.. 1 Qd 10)  Accu-Chek Aviva  Strp (Glucose Blood) .... Tests Once Daily Dx 250.00 11)  Pennsaid 1.5 % Soln (Diclofenac Sodium) .... 3-5 Gtt On Skin Three Times A Day For Pain 12)  Fish Oil 1000 Mg Caps (Omega-3 Fatty Acids) .... Take 1 Capsule By Mouth Once A Day 13)  Glucosamine-Chondroitin 500-400 Mg Caps (Glucosamine-Chondroitin) .... Take One By Mouth Once Daily  Allergies (verified): 1)  ! Sulfadiazine (Sulfadiazine) 2)  ! * Fish 3)  ! Quinine 4)  ! Fernand Parkins  Past History:  Past Medical History: Reviewed history from 03/13/2010 and no changes required. Current Problems:    ATRIAL FIBRILLATION (ICD-427.31) ANTICOAGULATION THERAPY (ICD-V58.61) HYPERTENSION (ICD-401.9) CHEST PAIN (ICD-786.50) HEMATOMA (ICD-924.9) DIVERTICULOSIS, COLON (ICD-562.10) PARESTHESIA (ICD-782.0) CATARACT, SENILE NOS (ICD-366.10) SOFT TISSUE DISORDER, MOUTH (ICD-528.9) LEG PAIN (ICD-729.5) SWEATING (ICD-780.8) ALLERGIC RHINITIS (ICD-477.9) LOW BACK PAIN (ICD-724.2) VITAMIN D DEFICIENCY (ICD-268.9) DIABETES MELLITUS, TYPE II (ICD-250.00) GERD (ICD-530.81)  St Jude valve  mitral  valve prosthesis  Past Surgical History: Valve surgery 1995 Hysterectomy cholecystectomy Mitral valve replacement.  Family History: Reviewed history from 01/31/2010 and no changes required. Family History Hypertension Family History of Diabetes: Mother, Brother Family History of Heart Disease: Brother No FH of Colon Cancer: Family History of Pancreatic Cancer: Brother Mother:  epilepsy and brain tumor  Social History: Occupation:Retired Married Former Smoker Regular exercise-yes,  bowling Alcohol Use - no Illicit Drug Use - no Daily Caffeine Use rare  Review of Systems       The patient complains of night sweats, urine leakage,  and voice change.  The patient denies allergy/sinus, anemia, anxiety-new, arthritis/joint pain, back pain, blood in urine, breast changes/lumps, change in vision, confusion, cough, coughing up blood, depression-new, fainting, fatigue, fever, headaches-new, hearing problems, heart murmur, heart rhythm changes, itching, menstrual pain, muscle pains/cramps, nosebleeds, pregnancy symptoms, shortness of breath, skin rash, sleeping problems, sore throat, swelling of feet/legs, swollen lymph glands, thirst - excessive, urination - excessive, urination changes/pain, and vision changes.         Pertinent positive and negative review of systems were noted in the above HPI. All other ROS was otherwise negative.   Vital Signs:  Patient profile:   70 year old female Height:      62 inches Weight:      179.8 pounds BMI:     33.00 Pulse rate:   84 / minute Pulse rhythm:   irregular BP sitting:   120 / 76  (left arm) Cuff size:   regular  Vitals Entered By: Harlow Mares CMA Duncan Dull) (May 14, 2010 2:49 PM)   Impression & Recommendations:  Problem # 1:  DIVERTICULOSIS, COLON (ICD-562.10) due for recall colonoscopy. Last exam according to our records was in 2001. Patient denies ever having colonoscopy. She will be scheduled for September of this year. I will discuss with Dr. Winn Jock heparin versus Lovenox bridge. He would prefer Lovenox picture because of she would not have to be admitted to the hospital. We have instructed her in colonoscopy prep today and will let her know about day all anti-coagulation plan  Patient Instructions: 1)  schedule screening colonoscopy. 10 2011 2)  Discuss with Dr. Winn Jock Coumadin and bridge either with  heparin or Lovenox 3)  Copy sent to : Dr Posey Rea, Dr [name]  Appended Document: Orders Update    Clinical Lists Changes  Medications: Added new medication of MIRALAX   POWD (POLYETHYLENE GLYCOL 3350) As per prep  instructions. - Signed Added new medication of REGLAN  10 MG  TABS (METOCLOPRAMIDE HCL) As per prep instructions. - Signed Added new medication of DULCOLAX 5 MG  TBEC (BISACODYL) Day before procedure take 2 at 3pm and 2 at 8pm. - Signed Rx of MIRALAX   POWD (POLYETHYLENE GLYCOL 3350) As per prep  instructions.;  #255gm x 0;  Signed;  Entered by: Lamona Curl CMA (AAMA);  Authorized by: Hart Carwin MD;  Method used: Electronically to [facility], [place], [place]  [id number], Ph: [phone], Fax: [phone] Rx of REGLAN 10 MG  TABS (METOCLOPRAMIDE HCL) As per prep instructions.;  #2 x 0;  Signed;  Entered by: Lamona Curl CMA (AAMA);  Authorized by: Hart Carwin MD;  Method used: Electronically to [facility], [place], [place]  [id number], Ph: [phone], Fax: [phone] Rx of DULCOLAX 5 MG  TBEC (BISACODYL) Day before procedure take 2 at 3pm and 2 at 8pm.;  #4 x 0;  Signed;  Entered by: Lamona Curl CMA (AAMA);  Authorized by: Hart Carwin MD;  Method used: Electronically to [facility], [place], [place]  [id number], Ph: [phone], Fax: [phone] Orders: Added new Test order of Colonoscopy (Colon) - Signed    Prescriptions: DULCOLAX 5 MG  TBEC (BISACODYL) Day before procedure take 2 at 3pm and  2 at 8pm.  #4 x 0   Entered by:   Lamona Curl CMA (AAMA)   Authorized by:   Hart Carwin MD   Signed by:   Lamona Curl CMA (AAMA) on 05/14/2010   Method used:   Electronically to        [facility] (retail)       [address]       [place], [place]  [id number]       Ph: [phone]       Fax: [phone]   RxID:   [id number] REGLAN 10 MG  TABS (METOCLOPRAMIDE HCL) As per prep instructions.  #2 x 0   Entered by:   Lamona Curl CMA (AAMA)   Authorized by:   Hart Carwin MD   Signed by:   Lamona Curl CMA (AAMA) on 05/14/2010   Method used:    Electronically to        [facility] (retail)       [address]       [place], [place]  [id number]       Ph: [phone]       Fax: [phone]   RxID:   [id number] MIRALAX   POWD (POLYETHYLENE GLYCOL 3350) As per prep  instructions.  #255gm x 0   Entered by:   Lamona Curl CMA (AAMA)   Authorized by:   Hart Carwin MD   Signed by:   Lamona Curl CMA (AAMA) on 05/14/2010   Method used:   Electronically to        [facility] (retail)       [address]       [place], [place]  [id number]       Ph: [phone]       Fax: [phone]   RxID:   [id number]

## 2010-11-27 NOTE — Medication Information (Signed)
Summary: rov/tm  Anticoagulant Therapy  Managed by: Weston Brass, PharmD Referring MD: Charlton Haws MD PCP: Jacinta Shoe, MD Supervising MD: Gala Romney MD, Reuel Boom Indication 1: Atrial Fibrillation (ICD-427.31) Indication 2: St. Jude Valve Type (ICD-SJV) Valve Position: Mitral Lab Used: [facility] Site: [facility] INR POC 2.7 INR RANGE 3 - 3.5  Dietary changes: yes       Details: increased vitamin k intake  Health status changes: no    Bleeding/hemorrhagic complications: no    Recent/future hospitalizations: no    Any changes in medication regimen? no    Recent/future dental: no  Any missed doses?: no       Is patient compliant with meds? yes       Allergies: 1)  ! Sulfadiazine (Sulfadiazine) 2)  ! * Fish 3)  ! Quinine 4)  ! * Bananas  Anticoagulation Management History:      The patient is taking warfarin and comes in today for a routine follow up visit.  Positive risk factors for bleeding include an age of 72 years or older, history of CVA/TIA, and presence of serious comorbidities.  The bleeding index is 'high risk'.  Positive CHADS2 values include History of HTN, History of Diabetes, and Prior Stroke/CVA/TIA.  Negative CHADS2 values include Age > 54 years old.  The start date was 01/02/1998.  Her last INR was 1.9 ratio.  Anticoagulation responsible provider: Dena Esperanza MD, Reuel Boom.  INR POC: 2.7.  Cuvette Lot#: [id number].  Exp: 07/2011.    Anticoagulation Management Assessment/Plan:      The patient's current anticoagulation dose is Warfarin sodium 10 mg tabs: Use as directed by Anticoagulation Clinic, Coumadin 5 mg tabs: as dirrected.  The target INR is 3 - 3.5.  The next INR is due 06/18/2010.  Anticoagulation instructions were given to patient.  Results were reviewed/authorized by Weston Brass, PharmD.  She was notified by Weston Brass PharmD.         Prior Anticoagulation Instructions: INR 2.4 Today 15mg s and on Thursday take 12.5mg s then resume 10mg s everyday except  12.5mg s on Mondays, Wednesdays and Fridays. Recheck in 3 weeks.   Current Anticoagulation Instructions: INR 2.7  Take 15mg  today then increase dose to 12.5mg  every day except 10mg  on Sunday, Tuesday and Thursday.

## 2010-11-27 NOTE — Medication Information (Signed)
Summary: [facility] coumadin - [facility]  Anticoagulant Therapy  Managed by: Eda Keys, PharmD Referring MD: Charlton Haws MD PCP: Jacinta Shoe, MD Supervising MD: Gala Romney MD, Reuel Boom Indication 1: Atrial Fibrillation (ICD-427.31) Indication 2: St. Jude Valve Type (ICD-SJV) Lab Used: [facility] Site: [facility] INR RANGE 3 - 3.5  Dietary changes: no    Health status changes: no    Bleeding/hemorrhagic complications: no    Recent/future hospitalizations: no    Any changes in medication regimen? no    Recent/future dental: no  Any missed doses?: no       Is patient compliant with meds? yes       Allergies: 1)  ! Sulfadiazine (Sulfadiazine) 2)  ! * Fish 3)  ! Quinine 4)  ! * Bananas  Anticoagulation Management History:      The patient is taking warfarin and comes in today for a routine follow up visit.  Positive risk factors for bleeding include an age of 72 years or older and presence of serious comorbidities.  The bleeding index is 'intermediate risk'.  Positive CHADS2 values include History of HTN and History of Diabetes.  Negative CHADS2 values include Age > 79 years old.  The start date was 01/02/1998.  Her last INR was 1.9 ratio.  Anticoagulation responsible provider: Quince Santana MD, Reuel Boom.  Cuvette Lot#: [id number].  Exp: 01/2011.    Anticoagulation Management Assessment/Plan:      The patient's current anticoagulation dose is Coumadin 10 mg  tabs: 6 mg 6 times a week and 5mg  one time a week, Coumadin 5 mg tabs: as dirrected.  The target INR is 3 - 3.5.  The next INR is due 12/25/2009.  Results were reviewed/authorized by Eda Keys, PharmD.  She was notified by Eda Keys.         Prior Anticoagulation Instructions: INR 2.1  below range 3-3.5  Coumadin 12.5mg  today and tomorrow 2/3 and 2/4   Then continue 10mg  each day except 5mg  on Thur  Current Anticoagulation Instructions: INR 2.4  Take 15 mg today.  Then start NEW dosing schedule of 10 mg every  day.  Return to clinic in 2 weeks.

## 2010-11-27 NOTE — Medication Information (Signed)
Summary: rov/tm  Anticoagulant Therapy  Managed by: Cloyde Reams, RN, BSN Referring MD: Charlton Haws MD PCP: Jacinta Shoe, MD Supervising MD: Tenny Craw MD, Gunnar Fusi Indication 1: Atrial Fibrillation (ICD-427.31) Indication 2: St. Jude Valve Type (ICD-SJV) Lab Used: [facility] Site: [facility] INR POC 2.7 INR RANGE 3 - 3.5  Dietary changes: yes       Details: Ate incr amt of vit K last pm, took 12.5mg  coumadin.  Health status changes: no    Bleeding/hemorrhagic complications: no    Recent/future hospitalizations: no    Any changes in medication regimen? yes       Details: added fish oil daily.  Pt aware to monitor for s+s of bleeding, aware at incr risk for bleeding.    Recent/future dental: no  Any missed doses?: no       Is patient compliant with meds? yes       Allergies: 1)  ! Sulfadiazine (Sulfadiazine) 2)  ! * Fish 3)  ! Quinine 4)  ! * Bananas  Anticoagulation Management History:      The patient is taking warfarin and comes in today for a routine follow up visit.  Positive risk factors for bleeding include an age of 70 years or older and presence of serious comorbidities.  The bleeding index is 'intermediate risk'.  Positive CHADS2 values include History of HTN and History of Diabetes.  Negative CHADS2 values include Age > 72 years old.  The start date was 01/02/1998.  Her last INR was 1.9 ratio.  Anticoagulation responsible provider: Tenny Craw MD, Gunnar Fusi.  INR POC: 2.7.  Cuvette Lot#: [id number].  Exp: 02/2011.    Anticoagulation Management Assessment/Plan:      The patient's current anticoagulation dose is Warfarin sodium 10 mg tabs: Use as directed by Anticoagulation Clinic, Coumadin 5 mg tabs: as dirrected.  The target INR is 3 - 3.5.  The next INR is due 01/29/2010.  Anticoagulation instructions were given to patient.  Results were reviewed/authorized by Cloyde Reams, RN, BSN.  She was notified by Cloyde Reams RN.         Prior Anticoagulation Instructions: INR  2.7 Today take 15mg s then change dose to 10mg s everyday except 15mg s on Mondays. Recheck in 2 weeks.   Current Anticoagulation Instructions: INR 2.7  Start taking 10mg  daily except 12.5mg  on Mondays, Wednesdays, and Fridays.  Recheck in 3 weeks.

## 2010-11-27 NOTE — Letter (Signed)
Summary: [facility]   Imported By: Sherian Rein 02/27/2010 08:48:20  _____________________________________________________________________  External Attachment:    Type:   Image     Comment:   External Document

## 2010-11-27 NOTE — Medication Information (Signed)
Summary: rov/sp  Anticoagulant Therapy  Managed by: Reina Fuse, PharmD Referring MD: Charlton Haws MD PCP: Jacinta Shoe, MD Supervising MD: Antoine Poche MD, Fayrene Fearing Indication 1: Atrial Fibrillation (ICD-427.31) Indication 2: St. Jude Valve Type (ICD-SJV) Valve Position: Mitral Lab Used: [facility] Site: [facility] INR POC 2.8 INR RANGE 3 - 3.5  Dietary changes: yes       Details: Has been eating less leafy greens the past couple of weeks.   Health status changes: no    Bleeding/hemorrhagic complications: no    Recent/future hospitalizations: no    Any changes in medication regimen? yes       Details: Had a Zpak for strep throat. Finished on 8/12.  Recent/future dental: no  Any missed doses?: yes     Details: missed one dose this week  Is patient compliant with meds? yes      Comments: Colonoscopy scheduled for 9/14. Pt will need Lovenox bridge.  Pt has noticed just a small amout of bright red blood when she passes a hard stool (h/o of hemorrhoids).  Allergies: 1)  ! Sulfadiazine (Sulfadiazine) 2)  ! * Fish 3)  ! Quinine 4)  ! * Bananas  Anticoagulation Management History:      The patient is taking warfarin and comes in today for a routine follow up visit.  Positive risk factors for bleeding include an age of 70 years or older, history of CVA/TIA, and presence of serious comorbidities.  The bleeding index is 'high risk'.  Positive CHADS2 values include History of HTN, History of Diabetes, and Prior Stroke/CVA/TIA.  Negative CHADS2 values include Age > 72 years old.  The start date was 01/02/1998.  Her last INR was 1.9 ratio.  Anticoagulation responsible provider: Antoine Poche MD, Fayrene Fearing.  INR POC: 2.8.  Cuvette Lot#: [id number].  Exp: 07/2011.    Anticoagulation Management Assessment/Plan:      The patient's current anticoagulation dose is Warfarin sodium 10 mg tabs: Use as directed by Anticoagulation Clinic, Coumadin 5 mg tabs: as dirrected.  The target INR is 3 - 3.5.  The  next INR is due 07/05/2010.  Anticoagulation instructions were given to patient.  Results were reviewed/authorized by Reina Fuse, PharmD.  She was notified by Reina Fuse PharmD.         Prior Anticoagulation Instructions: INR 2.7  Take 15mg  today then increase dose to 12.5mg  every day except 10mg  on Sunday, Tuesday and Thursday.    Current Anticoagulation Instructions: INR 2.8  Today, Monday, August 22nd, take Coumadin 2 tabs (20 mg). Then, take Coumadin 10 mg on Mon and Fri. and Coumadin 12.5 mg on all other days (Sun, Tues, Wed, Thur, Sat). Return to clinic in 3 weeks prior to colonoscopy for Lovenox bridging instructions.

## 2010-11-27 NOTE — Medication Information (Signed)
Summary: [facility]  Anticoagulant Therapy  Managed by: Bethena Midget, RN, BSN Referring MD: Charlton Haws MD PCP: Jacinta Shoe, MD Supervising MD: Johney Frame MD, Fayrene Fearing Indication 1: Atrial Fibrillation (ICD-427.31) Indication 2: St. Jude Valve Type (ICD-SJV) Valve Position: Mitral Lab Used: [facility] Site: [facility] INR POC 2.4 INR RANGE 3 - 3.5  Dietary changes: no    Health status changes: no    Bleeding/hemorrhagic complications: no    Recent/future hospitalizations: no    Any changes in medication regimen? no    Recent/future dental: no  Any missed doses?: yes     Details: Missed Monday's dose  Is patient compliant with meds? yes       Allergies: 1)  ! Sulfadiazine (Sulfadiazine) 2)  ! * Fish 3)  ! Quinine 4)  ! * Bananas  Anticoagulation Management History:      The patient is taking warfarin and comes in today for a routine follow up visit.  Positive risk factors for bleeding include an age of 53 years or older, history of CVA/TIA, and presence of serious comorbidities.  The bleeding index is 'high risk'.  Positive CHADS2 values include History of HTN, History of Diabetes, and Prior Stroke/CVA/TIA.  Negative CHADS2 values include Age > 73 years old.  The start date was 01/02/1998.  Her last INR was 1.9 ratio.  Anticoagulation responsible provider: Daymeon Fischman MD, Fayrene Fearing.  INR POC: 2.4.  Cuvette Lot#: [id number].  Exp: 06/2011.    Anticoagulation Management Assessment/Plan:      The patient's current anticoagulation dose is Warfarin sodium 10 mg tabs: Use as directed by Anticoagulation Clinic, Coumadin 5 mg tabs: as dirrected.  The target INR is 3 - 3.5.  The next INR is due 05/30/2010.  Anticoagulation instructions were given to patient.  Results were reviewed/authorized by Bethena Midget, RN, BSN.  She was notified by Bethena Midget, RN, BSN.         Prior Anticoagulation Instructions: INR 3.2 Today take 15mg s then resume 10mg s everyday except 12.5mg s 0n Mondays, Wednesdays and  Fridays. Recheck in 4 weeks.   Current Anticoagulation Instructions: INR 2.4 Today 15mg s and on Thursday take 12.5mg s then resume 10mg s everyday except 12.5mg s on Mondays, Wednesdays and Fridays. Recheck in 3 weeks.

## 2010-11-29 NOTE — Medication Information (Signed)
Summary: rov/ewj  Anticoagulant Therapy  Managed by: Windell Hummingbird, RN Referring MD: Charlton Haws MD PCP: Jacinta Shoe, MD Supervising MD: Patty Sermons, T Indication 1: Atrial Fibrillation (ICD-427.31) Indication 2: St. Jude Valve Type (ICD-SJV) Valve Position: Mitral Lab Used: [facility] Site: [address] INR POC 2.5 INR RANGE 3 - 3.5  Dietary changes: yes       Details: Eating more greens  Health status changes: no    Bleeding/hemorrhagic complications: no    Recent/future hospitalizations: no    Any changes in medication regimen? no    Recent/future dental: no  Any missed doses?: yes     Details: Missed "maybe 2-3 doses"  Is patient compliant with meds? yes       Allergies: 1)  ! Sulfadiazine (Sulfadiazine) 2)  ! * Fish 3)  ! Quinine 4)  ! * Bananas  Anticoagulation Management History:      Positive risk factors for bleeding include an age of 72 years or older, history of CVA/TIA, and presence of serious comorbidities.  The bleeding index is 'high risk'.  Positive CHADS2 values include History of HTN, History of Diabetes, and Prior Stroke/CVA/TIA.  Negative CHADS2 values include Age > 12 years old.  The start date was 01/02/1998.  Her last INR was 1.9 ratio.  Anticoagulation responsible provider: Passion Lavin, T.  INR POC: 2.5.  Cuvette Lot#: [id number].  Exp: 10/2011.    Anticoagulation Management Assessment/Plan:      The patient's current anticoagulation dose is Warfarin sodium 10 mg tabs: Use as directed by Anticoagulation Clinic, Coumadin 5 mg tabs: as dirrected.  The target INR is 3 - 3.5.  The next INR is due 12/05/2010.  Anticoagulation instructions were given to patient.  Results were reviewed/authorized by Windell Hummingbird, RN.  She was notified by Windell Hummingbird, RN.         Prior Anticoagulation Instructions: INR 2.3  Take 2-10 mg tablets today, then increase dose to 12.5 mg everyday except 10 mg on Monday and Friday   Recheck INR in 2 weeks.  Current  Anticoagulation Instructions: INR 2.5  Take 15mg  today then increase dose to 12.5mg  daily except 10mg  on Monday.  Recheck INR in 2 weeks.

## 2010-12-07 ENCOUNTER — Encounter: Payer: Self-pay | Admitting: Cardiology

## 2010-12-07 ENCOUNTER — Encounter (INDEPENDENT_AMBULATORY_CARE_PROVIDER_SITE_OTHER): Payer: MEDICARE

## 2010-12-07 DIAGNOSIS — I4891 Unspecified atrial fibrillation: Secondary | ICD-10-CM

## 2010-12-07 DIAGNOSIS — Z7901 Long term (current) use of anticoagulants: Secondary | ICD-10-CM

## 2010-12-07 LAB — CONVERTED CEMR LAB: POC INR: 2.4

## 2010-12-13 NOTE — Medication Information (Signed)
Summary: [facility]  Anticoagulant Therapy  Managed by: Weston Brass, PharmD Referring MD: Charlton Haws MD PCP: Jacinta Shoe, MD Supervising MD: Jens Som MD, Arlys John Indication 1: Atrial Fibrillation (ICD-427.31) Indication 2: St. Jude Valve Type (ICD-SJV) Valve Position: Mitral Lab Used: [facility] Site: [facility] INR POC 2.4 INR RANGE 3 - 3.5  Dietary changes: no    Health status changes: no    Bleeding/hemorrhagic complications: no    Recent/future hospitalizations: no    Any changes in medication regimen? no    Recent/future dental: no  Any missed doses?: no       Is patient compliant with meds? yes       Allergies: 1)  ! Sulfadiazine (Sulfadiazine) 2)  ! * Fish 3)  ! Quinine 4)  ! * Bananas  Anticoagulation Management History:      The patient is taking warfarin and comes in today for a routine follow up visit.  Positive risk factors for bleeding include an age of 45 years or older, history of CVA/TIA, and presence of serious comorbidities.  The bleeding index is 'high risk'.  Positive CHADS2 values include History of HTN, History of Diabetes, and Prior Stroke/CVA/TIA.  Negative CHADS2 values include Age > 84 years old.  The start date was 01/02/1998.  Her last INR was 1.9 ratio.  Anticoagulation responsible provider: Jens Som MD, Arlys John.  INR POC: 2.4.  Cuvette Lot#: [id number].  Exp: 10/2011.    Anticoagulation Management Assessment/Plan:      The patient's current anticoagulation dose is Warfarin sodium 10 mg tabs: Use as directed by Anticoagulation Clinic, Coumadin 5 mg tabs: as dirrected.  The target INR is 3 - 3.5.  The next INR is due 12/21/2010.  Anticoagulation instructions were given to patient.  Results were reviewed/authorized by Weston Brass, PharmD.  She was notified by Margot Chimes PharmD Candidate.         Prior Anticoagulation Instructions: INR 2.5  Take 15mg  today then increase dose to 12.5mg  daily except 10mg  on Monday.  Recheck INR in 2  weeks.   Current Anticoagulation Instructions: INR 2.4   Today we increase your Coumadin dose to 12.5 mg everyday except on Mondays and Fridays when you take 15 mg.  Recheck INR in 2 weeks.

## 2010-12-20 DIAGNOSIS — Z9889 Other specified postprocedural states: Secondary | ICD-10-CM

## 2010-12-20 DIAGNOSIS — I4891 Unspecified atrial fibrillation: Secondary | ICD-10-CM

## 2010-12-20 DIAGNOSIS — G459 Transient cerebral ischemic attack, unspecified: Secondary | ICD-10-CM

## 2010-12-20 DIAGNOSIS — I359 Nonrheumatic aortic valve disorder, unspecified: Secondary | ICD-10-CM

## 2010-12-21 ENCOUNTER — Encounter (INDEPENDENT_AMBULATORY_CARE_PROVIDER_SITE_OTHER): Payer: Medicare Other

## 2010-12-21 ENCOUNTER — Encounter: Payer: Self-pay | Admitting: Cardiology

## 2010-12-21 DIAGNOSIS — Z7901 Long term (current) use of anticoagulants: Secondary | ICD-10-CM

## 2010-12-21 DIAGNOSIS — I359 Nonrheumatic aortic valve disorder, unspecified: Secondary | ICD-10-CM

## 2010-12-21 DIAGNOSIS — I4891 Unspecified atrial fibrillation: Secondary | ICD-10-CM

## 2010-12-25 NOTE — Medication Information (Signed)
Summary: rov/ejm  Anticoagulant Therapy  Managed by: Georgina Pillion, PharmD Referring MD: Charlton Haws MD PCP: Jacinta Shoe, MD Supervising MD: Jens Som MD, Arlys John Indication 1: Atrial Fibrillation (ICD-427.31) Indication 2: St. Jude Valve Type (ICD-SJV) Valve Position: Mitral Lab Used: [facility] Site: [facility] INR POC 2.5 INR RANGE 3 - 3.5  Dietary changes: no    Health status changes: no    Bleeding/hemorrhagic complications: no    Recent/future hospitalizations: no    Any changes in medication regimen? yes       Details: Taking DHEA OTC  Recent/future dental: no  Any missed doses?: yes     Details: Missed dose two nights ago but took it the following morning.  Is patient compliant with meds? yes      Comments: Patient wasn't taking her medicine as previously prescribed with the dose increase. Counseled the patient on proper dose and will recheck in 2 weeks.  Allergies: 1)  ! Sulfadiazine (Sulfadiazine) 2)  ! * Fish 3)  ! Quinine 4)  ! * Bananas  Anticoagulation Management History:      Positive risk factors for bleeding include an age of 72 years or older, history of CVA/TIA, and presence of serious comorbidities.  The bleeding index is 'high risk'.  Positive CHADS2 values include History of HTN, History of Diabetes, and Prior Stroke/CVA/TIA.  Negative CHADS2 values include Age > 71 years old.  The start date was 01/02/1998.  Her last INR was 1.9 ratio.  Anticoagulation responsible provider: Jens Som MD, Arlys John.  INR POC: 2.5.  Cuvette Lot#: [id number].  Exp: 10/2011.    Anticoagulation Management Assessment/Plan:      The patient's current anticoagulation dose is Warfarin sodium 10 mg tabs: Use as directed by Anticoagulation Clinic, Coumadin 5 mg tabs: as dirrected.  The target INR is 3 - 3.5.  The next INR is due 01/04/2011.  Anticoagulation instructions were given to patient.  Results were reviewed/authorized by Georgina Pillion, PharmD.  She was notified by  Georgina Pillion PharmD.         Prior Anticoagulation Instructions: INR 2.4   Today we increase your Coumadin dose to 12.5 mg everyday except on Mondays and Fridays when you take 15 mg.  Recheck INR in 2 weeks.    Current Anticoagulation Instructions: Take 12.5 mg daily EXCEPT for 15 mg on Mondays and Fridays only.  INR 2.5

## 2011-01-07 ENCOUNTER — Other Ambulatory Visit: Payer: Self-pay

## 2011-01-08 LAB — PROTIME-INR
INR: 2.56 — ABNORMAL HIGH (ref 0.00–1.49)
Prothrombin Time: 27.6 seconds — ABNORMAL HIGH (ref 11.6–15.2)

## 2011-01-14 ENCOUNTER — Encounter: Payer: Self-pay | Admitting: Internal Medicine

## 2011-01-14 ENCOUNTER — Ambulatory Visit (INDEPENDENT_AMBULATORY_CARE_PROVIDER_SITE_OTHER): Payer: Medicare Other | Admitting: Internal Medicine

## 2011-01-14 DIAGNOSIS — G5600 Carpal tunnel syndrome, unspecified upper limb: Secondary | ICD-10-CM

## 2011-01-14 DIAGNOSIS — I4891 Unspecified atrial fibrillation: Secondary | ICD-10-CM

## 2011-01-14 DIAGNOSIS — Z9889 Other specified postprocedural states: Secondary | ICD-10-CM

## 2011-01-14 DIAGNOSIS — Z7901 Long term (current) use of anticoagulants: Secondary | ICD-10-CM

## 2011-01-24 NOTE — Assessment & Plan Note (Signed)
Summary: 4 MO FU/ STC   Vital Signs:  Patient profile:   70 year old female Height:      62 inches Weight:      182 pounds BMI:     33.41 Temp:     98.4 degrees F oral Pulse rate:   68 / minute Pulse rhythm:   irregular Resp:     16 per minute BP sitting:   110 / 62  (left arm)  Vitals Entered By: Lanier Prude, CMA(AAMA) (January 14, 2011 9:56 AM) CC: 4 mo f/u c/o intermittnent dizzy spells Is Patient Diabetic? Yes   Primary Care Provider:  Jacinta Shoe, MD  CC:  4 mo f/u c/o intermittnent dizzy spells.  History of Present Illness: The patient presents for a follow up of hypertension, diabetes, hyperlipidemia  C/o numbness in R hand at night C/o occasional feet numbness   Current Medications (verified): 1)  Furosemide 40 Mg Tabs (Furosemide) .... Take One Tablet Daily 2)  Warfarin Sodium 10 Mg Tabs (Warfarin Sodium) .... Use As Directed By Anticoagulation Clinic 3)  Coumadin 5 Mg Tabs (Warfarin Sodium) .... As Dirrected 4)  Ranitidine Hcl 300 Mg Tabs (Ranitidine Hcl) .Marland Kitchen.. 1 Po  Once Daily As Needed 5)  Skelaxin 800 Mg Tabs (Metaxalone) .... As Needed 6)  Klor-Con M20 20 Meq  Tbcr (Potassium Chloride Crys Cr) .... Once Daily 7)  Lanoxin 0.125 Mg Tabs (Digoxin) .Marland Kitchen.. 1 Tab Daily 8)  Cartia Xt 240 Mg  Cp24 (Diltiazem Hcl Coated Beads) .Marland Kitchen.. 1 By Mouth Qd 9)  Vitamin D3 1000 Unit  Tabs (Cholecalciferol) .Marland Kitchen.. 1 Qd 10)  Accu-Chek Aviva  Strp (Glucose Blood) .... Tests Once Daily Dx 250.00 11)  Pennsaid 1.5 % Soln (Diclofenac Sodium) .... 3-5 Gtt On Skin Three Times A Day For Pain 12)  Fish Oil 1000 Mg Caps (Omega-3 Fatty Acids) .... Take 1 Capsule By Mouth Once A Day 13)  Glucosamine-Chondroitin 500-400 Mg Caps (Glucosamine-Chondroitin) .... Take One By Mouth Once Daily 14)  Loratadine 10 Mg Tabs (Loratadine) .Marland Kitchen.. 1 By Mouth Once Daily As Needed Allergies 15)  Enoxaparin Sodium 80 Mg/0.59ml Soln (Enoxaparin Sodium) .... Inject 80mg  Subcutaneously Every 12 Hrs As  Instructed 16)  Tramadol Hcl 50 Mg Tabs (Tramadol Hcl) .Marland Kitchen.. 1 - 2 By Mouth Q 6 Hrs As Needed Pain 17)  Dhea (Otc) .... 2 Tablets Daily  Allergies (verified): 1)  ! Sulfadiazine (Sulfadiazine) 2)  ! * Fish 3)  ! Quinine 4)  ! Fernand Parkins  Past History:  Past Medical History: Last updated: 08/17/2010  ATRIAL FIBRILLATION (ICD-427.31) ANTICOAGULATION THERAPY (ICD-V58.61) HYPERTENSION (ICD-401.9) CHEST PAIN (ICD-786.50) HEMATOMA (ICD-924.9) DIVERTICULOSIS, COLON (ICD-562.10) PARESTHESIA (ICD-782.0) CATARACT, SENILE NOS (ICD-366.10) SOFT TISSUE DISORDER, MOUTH (ICD-528.9) LEG PAIN (ICD-729.5) SWEATING (ICD-780.8) ALLERGIC RHINITIS (ICD-477.9) LOW BACK PAIN (ICD-724.2) VITAMIN D DEFICIENCY (ICD-268.9) DIABETES MELLITUS, TYPE II (ICD-250.00) GERD (ICD-530.81) St Jude valve mitral  valve prosthesis  Past Surgical History: Last updated: 05/14/2010 Valve surgery 1995 Hysterectomy cholecystectomy Mitral valve replacement.  Family History: Family History Hypertension Family History of Diabetes: Mother, Brother Family History of Heart Disease: Brother No FH of Colon Cancer: Family History of Pancreatic Cancer: Brother Mother:  epilepsy and brain tumor and Alzheimer  Review of Systems  The patient denies fever, dyspnea on exertion, abdominal pain, and hematochezia.    Physical Exam  General:  alert and overweight-appearing.   Eyes:  vision grossly intact, pupils equal, and pupils round.   Nose:  no external deformity and no nasal discharge.  Mouth:  no gingival abnormalities and pharynx pink and moist.   Neck:  supple and no masses.   Lungs:  normal respiratory effort and normal breath sounds.   Heart:  normal rate,irregular rhythm and no rub. BLE without edema. Abdomen:  S/NT Genitalia:  Normal introitus for age, no external lesions, no vaginal discharge, mucosa pink and moist, no vaginal or cervical lesions, no vaginal atrophy, no friaility or hemorrhage, normal  uterus size and position, no adnexal masses or tenderness Msk:  left knee with significant warmth and effusion anterior only;  no signifcant warmth or sweling tothe medial//lateral aspectsor posterior;  has decreased ROM to about 90 degress only, mild crepitus but no click or catch Neurologic:  No cranial nerve deficits noted. Station and gait are normal. Plantar reflexes are down-going bilaterally. DTRs are symmetrical throughout. Sensory, motor and coordinative functions appear intact. Romberg (-) Heel to toe nl Skin:  large 3 cm intact, square shapped, blister on central top of left foot - no redness or cellulitis Psych:  Cognition and judgment appear intact. Alert and cooperative with normal attention span and concentration. No apparent delusions, illusions, hallucinations   Impression & Recommendations:  Problem # 1:  HYPERTENSION (ICD-401.9) Assessment Unchanged  Her updated medication list for this problem includes:    Furosemide 40 Mg Tabs (Furosemide) .Marland Kitchen... Take one tablet daily    Cartia Xt 240 Mg Cp24 (Diltiazem hcl coated beads) .Marland Kitchen... 1 by mouth qd  BP today: 110/62 Prior BP: 130/82 (09/17/2010)  Labs Reviewed: K+: 4.3 (09/17/2010) Creat: : 1.0 (09/17/2010)   Chol: 121 (09/28/2009)   HDL: 38.50 (09/28/2009)   LDL: 57 (09/28/2009)   TG: 126.0 (09/28/2009)  Problem # 2:  ANTICOAGULATION THERAPY (ICD-V58.61) Assessment: Unchanged On the regimen of medicine(s) reflected in the chart    Problem # 3:  ATRIAL FIBRILLATION (ICD-427.31) Assessment: Unchanged  Her updated medication list for this problem includes:    Warfarin Sodium 10 Mg Tabs (Warfarin sodium) ..... Use as directed by anticoagulation clinic    Coumadin 5 Mg Tabs (Warfarin sodium) .Marland Kitchen... As dirrected    Lanoxin 0.125 Mg Tabs (Digoxin) .Marland Kitchen... 1 tab daily    Cartia Xt 240 Mg Cp24 (Diltiazem hcl coated beads) .Marland Kitchen... 1 by mouth qd  Problem # 4:  GERD (ICD-530.81) Assessment: Unchanged  The following medications were  removed from the medication list:    Ranitidine Hcl 300 Mg Tabs (Ranitidine hcl) .Marland Kitchen... 1 po  once daily as needed  Problem # 5:  CARPAL TUNNEL SYNDROME (ICD-354.0) R Assessment: Deteriorated use a splint inj offered  Problem # 6:  MITRAL VALVE REPLACEMENT, HX OF (ICD-V15.1) >16 years Assessment: Unchanged Card appt is pending in 1 month  Complete Medication List: 1)  Furosemide 40 Mg Tabs (Furosemide) .... Take one tablet daily 2)  Warfarin Sodium 10 Mg Tabs (Warfarin sodium) .... Use as directed by anticoagulation clinic 3)  Coumadin 5 Mg Tabs (Warfarin sodium) .... As dirrected 4)  Skelaxin 800 Mg Tabs (Metaxalone) .... As needed 5)  Klor-con M20 20 Meq Tbcr (Potassium chloride crys cr) .... Once daily 6)  Lanoxin 0.125 Mg Tabs (Digoxin) .Marland Kitchen.. 1 tab daily 7)  Cartia Xt 240 Mg Cp24 (Diltiazem hcl coated beads) .Marland Kitchen.. 1 by mouth qd 8)  Accu-chek Aviva Strp (Glucose blood) .... Tests once daily dx 250.00 9)  Pennsaid 1.5 % Soln (Diclofenac sodium) .... 3-5 gtt on skin three times a day for pain 10)  Fish Oil 1000 Mg Caps (Omega-3 fatty acids) .... Take  1 capsule by mouth once a day 11)  Glucosamine-chondroitin 500-400 Mg Caps (Glucosamine-chondroitin) .... Take one by mouth once daily 12)  Loratadine 10 Mg Tabs (Loratadine) .Marland Kitchen.. 1 by mouth once daily as needed allergies 13)  Enoxaparin Sodium 80 Mg/0.64ml Soln (Enoxaparin sodium) .... Inject 80mg  subcutaneously every 12 hrs as instructed 14)  Vitamin D3 1000 Unit Tabs (Cholecalciferol) .Marland Kitchen.. 1 qd  Patient Instructions: 1)  Please schedule a follow-up appointment in 3 months well w/labs. 2)  HbgA1C prior to visit, ICD-9: 250.00 3)  Vit B12 782.0 Prescriptions: KLOR-CON M20 20 MEQ  TBCR (POTASSIUM CHLORIDE CRYS CR) once daily  #30 Tablet x 11   Entered and Authorized by:   Tresa Garter MD   Signed by:   Tresa Garter MD on 01/14/2011   Method used:   Print then Give to Patient   RxID:   [id number] CARTIA XT 240 MG   CP24 (DILTIAZEM HCL COATED BEADS) 1 by mouth qd  #30 Capsule x 11   Entered and Authorized by:   Tresa Garter MD   Signed by:   Tresa Garter MD on 01/14/2011   Method used:   Print then Give to Patient   RxID:   [id number] LANOXIN 0.125 MG TABS (DIGOXIN) 1 tab daily  #30 Tablet x 11   Entered and Authorized by:   Tresa Garter MD   Signed by:   Tresa Garter MD on 01/14/2011   Method used:   Print then Give to Patient   RxID:   [id number] COUMADIN 5 MG TABS (WARFARIN SODIUM) as dirrected  #30 Tablet x 3   Entered and Authorized by:   Tresa Garter MD   Signed by:   Tresa Garter MD on 01/14/2011   Method used:   Print then Give to Patient   RxID:   [id number] WARFARIN SODIUM 10 MG TABS (WARFARIN SODIUM) Use as directed by Anticoagulation Clinic  #40 Tablet x 3   Entered and Authorized by:   Tresa Garter MD   Signed by:   Tresa Garter MD on 01/14/2011   Method used:   Print then Give to Patient   RxID:   [id number] FUROSEMIDE 40 MG TABS (FUROSEMIDE) take one tablet daily  #30 Tablet x 6   Entered and Authorized by:   Tresa Garter MD   Signed by:   Tresa Garter MD on 01/14/2011   Method used:   Print then Give to Patient   RxID:   [id number]    Orders Added: 1)  Est. Patient Level IV [09323]

## 2011-03-09 ENCOUNTER — Other Ambulatory Visit: Payer: Self-pay | Admitting: Internal Medicine

## 2011-03-12 ENCOUNTER — Telehealth: Payer: Self-pay | Admitting: Cardiovascular Disease

## 2011-03-12 MED ORDER — AMOXICILLIN 500 MG PO TABS: ORAL_TABLET | ORAL | Status: DC

## 2011-03-12 NOTE — Assessment & Plan Note (Signed)
[facility]                            CARDIOLOGY OFFICE NOTE   NAME:Beth Miller, Beth Miller                      MRN:          [id number]  DATE:10/13/2008                            DOB:          1941-01-23    Savayah returns today for followup.  She has chronic AFib with a mitral  valve prosthesis.  She is doing well.  She did have the flu earlier.  Unfortunately, she could not find a flu vaccine to get prior to catching  the flu.  She had some atypical chest pain under her left breast at that  time and is now gone.  She has no documented coronary disease.  Unfortunately, also she fell recently and bruised her buttocks.  Her INR  was little bit high, and she has significant ecchymosis there.  It seems  to be healing.  On October 10, 2008, her INR was 5.2.  I suspect this  was due to an interaction with vitamin E which she has now stopped.  She  is due to get her INR checked next week.   REVIEW OF SYSTEMS:  Otherwise negative, and particularly she has not had  any dyspnea, palpitations, diaphoresis, or syncope.   PHYSICAL EXAMINATION:  GENERAL:  Remarkable for healthy-appearing black  female with improved and dentition.  VITAL SIGNS:  Blood pressure is 120/80, pulse 84 and irregular, weight  185.  HEENT:  Unremarkable.  NECK:  Carotids normal without bruit.  No lymphadenopathy, thyromegaly,  JVP elevation.  LUNGS:  Clear.  Good diaphragmatic motion.  No wheezing.  S1 click, S2.  No diastolic rumble.  PMI normal.  ABDOMEN:  Benign.  Bowel sounds positive.  No AAA.  No tenderness.  No  bruit.  No hepatosplenomegaly, hepatojugular reflux, or tenderness.  Buttocks does have some ecchymosis over the anterior surface which seems  to be healing.  EXTREMITIES:  Distal pulses are intact.  No edema.  NEUROLOGIC:  Nonfocal.  SKIN:  Warm and dry.  MUSCULOSKELETAL:  No muscular weakness.   EKG shows AFib and nonspecific ST-T wave changes.   Her current  medications include:  1. Cartia 240 a day.  2. Loratadine 10 mg a day.  3. An aspirin a day.  4. Glucosamine/chondroitin.  5. Ranitidine 150 a day.  6. Skelaxin 800 a day.   IMPRESSION:  1. Chronic atrial fibrillation, good rate control on calcium blocker.      Followup Coumadin Clinic next week.  2. Hematoma on her buttocks secondary to fall, seems to be healing      well.  She has not taken Coumadin in the last 2 days to let her INR      drift down.  She has stopped her vitamin E which is likely the      interaction which raised it.  She will call me if she has any other      bleeding problems.  3. Atypical chest pain in the setting of recent flu.  I doubt coronary      disease.  She will call me if they recur, and we can do an  adenosine Myoview on her, but she has no documented coronary      disease and her pain was atypical in the setting of having the flu.  4. Mitral valve prosthesis.  Last echo was done I believe in March.      She has normal LV function with a normal functioning prosthetic      valve.  She just had her teeth worked on and is at much low risk      for subacute bacterial endocarditis.  Apparently, she had 4 or 5      teeth pulled and had an abscess in one of them.  She does get      endocarditis prophylaxis prior to this and sees Dr. Hyacinth Meeker for it.      I will see her back in 6 months, and she will see the Coumadin      Clinic next week.     [name]. [name], MD, [facility]  Electronically Signed    PCN/MedQ  DD: 10/13/2008  DT: 10/13/2008  Job #: [id number]

## 2011-03-12 NOTE — Assessment & Plan Note (Signed)
[facility]ALTH[facility]                            CARDIOLOGY OFFICE NOTE   NAME:Beth Miller                      MRN:          [id number]  DATE:04/25/2008                            DOB:          1941-07-26    Beth Miller comes in today for followup.  She has an old prosthetic  mitral valve with chronic atrial fibrillation.  Last time I saw her, she  had some nausea.  We tried to cut back her Lanoxin, it made no  difference.  She has had her gallbladder out.  Nausea is not as intense.  I told her to follow up with her primary care MD.  She has not had any  significant vomiting, diarrhea, or other GI symptoms.   She believes her rate control was somewhat less optimum, even though we  increased her Cardizem to 300 a day.  Her echo showed good RV function  with an EF of 50-55%, which is actually improved.  Her prosthetic valve  was functioning normal and there was no other valve disease, including a  normal aortic valve.  I told her that this looked excellent.   REVIEW OF SYSTEMS:  Remarkable for some difficulty checking her Coumadin  level.  She likes to eat greens and does not eat a predictable amount  every week and her Coumadin has been somewhat difficult to adjust.  She  understands this but really enjoys eating her greens, otherwise  negative.   CURRENT MEDICATIONS:  1. Potassium 20 a day.  2. Coumadin as directed.  3. B complex vitamins.  4. Vitamin D.  5. Ranitidine 150 a day.  6. Skelaxin 800 a day.  7. Lanoxin to be increased back to 0.25 a day.  8. Cardizem 300 a day.   She uses [employer].   PHYSICAL EXAMINATION:  GENERAL:  Exam is remarkable for a healthy  appearing black female in no distress.  VITAL SIGNS:  Weight 190 and blood pressure 117/76.  She is in AFib with  a rate of 86, respiratory rate 14, and afebrile.  HEENT:  Unremarkable.  Carotids are without bruit.  No lymphadenopathy,  thyromegaly, or JVP elevation.  LUNGS:  Clear.  Good diaphragmatic motion.  No wheezing.  S1 click S2,  soft systolic murmur.  PMI normal.  ABDOMEN:  Benign.  Bowel sounds positive.  No AAA, no tenderness, and no  bruits.  Status post cholecystectomy.  No hepatosplenomegaly,  hepatojugular reflux.  No AAA.  EXTREMITIES:  Distal pluses are intact, no edema.  NEURO:  Nonfocal.  SKIN:  Warm and dry with a rash which is chronic over the left hand.  No  muscular weakness.   IMPRESSION:  1. Chronic atrial fibrillation, reasonable rate control, increased      digoxin, 0.25 a day.  Continue Cardizem.  2. Anticoagulation.  Follow up in the Coumadin Clinic with Beth Miller.  Again, I tried to get the patient understand the      importance of eating a regular amount of greens, so her INR would  be therapeutic.  3. Mitral valve replacement.  Valve functioning normally by echo.  No      evidence of hemolysis.  Followup echo in a year.  4. History of reflux.  Continue ranitidine 150 a day.  Symptoms      currently stable.   I will see her back in 6 months.     Beth Miller. Beth Emms, MD, [facility]  Electronically Signed    PCN/MedQ  DD: 04/25/2008  DT: 04/26/2008  Job #: [id number]

## 2011-03-12 NOTE — Assessment & Plan Note (Signed)
[facility]                            CARDIOLOGY OFFICE NOTE   NAME:Beth Miller, Beth Miller                      MRN:          [id number]  DATE:10/05/2007                            DOB:          [birth year]    Ms. Stiefel is seen today in followup.  She has a history of an old  mitral valve prosthesis.   She is in chronic atrial fibrillation.   She has been having some nausea lately.  There has been no real  vomiting.  It tends to occur in the morning and is not clearly related  to her meds.  There has been no diarrhea or abdominal pain.  She is  status post cholecystectomy but does have some fatty food intolerance.  She needs further workup for this, particularly since she is on  Coumadin.   In regard to this, she has not had any significant palpitations, PND, or  orthopnea.  There has been no TIA.  Her mitral valve has been  functioning well, but she needs to have a followup echo.   REVIEW OF SYSTEMS:  Otherwise, negative.   MEDICATIONS:  1. Potassium 20 a day.  2. Coumadin as directed.  3. Furosemide 40 a day.  4. Ranitidine 150 b.i.d.  5. Cardizem CD to be increased to 300 a day.  6. Skelaxin 80 b.i.d.  7. Digoxin to be discontinued.   EXAM:  Remarkable for a blood pressure 120/70.  She is in atrial  fibrillation at a rate of 70.  Respiratory rate of 14, afebrile.  Affect appropriate.  HEENT:  Unremarkable.  Carotids normal without bruit.  LUNGS:  Clear.  Good diaphragmatic motion.  No wheezing.  S1 click, S2.  No diastolic murmur.  No MR.  PMI normal.  ABDOMEN:  Benign.  There is no epigastric pain.  Status post  cholecystectomy laparoscopically.  No AAA.  No bruit.  No tenderness.  No hepatosplenomegaly.  No hepatojugular reflux.  Distal pulses intact.  No edema.  NEURO:  Nonfocal.  No muscular weakness.  SKIN:  Warm and dry.   EKG shows atrial fibrillation with nonspecific ST-T wave changes.   IMPRESSION:  1. Nausea.  Follow with  Dr. Posey Rea.  Suggest EGD, particularly      given her Coumadin.  Continue ranitidine.  2. Atrial fibrillation, rate control fine.  However, stop dig      secondary to nausea to see if this helps.  Increase Cardizem to 300      a day.  Continue anticoagulation.  3. Hypertension, currently well controlled.  Low salt diet.  Continue      current medications.  4. Old mitral valve prosthetic valve.  Sounds normal.  Followup echo      next week.  So long as it looks good, we will continue to observe.  5. History of decreased left ventricular function.  Ejection fraction      40-45%.  Recheck by echo.  No evidence of heart failure at this      time.  Continue current dose of Lasix at 40 a day.  Beth Miller. Beth Emms, MD, [facility]  Electronically Signed    PCN/MedQ  DD: 10/05/2007  DT: 10/05/2007  Job #: [id number]

## 2011-03-12 NOTE — Telephone Encounter (Signed)
Spoke with pt, due to her history of mitral valve repair she will require pre-meds. She is not allergic to any meds. Script for amoxicillian sent to [facility]

## 2011-03-12 NOTE — Telephone Encounter (Signed)
Per pt calling have a dental appointment on tomorrow. Does pt need pre-med before dental work.

## 2011-03-13 ENCOUNTER — Ambulatory Visit (INDEPENDENT_AMBULATORY_CARE_PROVIDER_SITE_OTHER): Payer: Medicare Other | Admitting: *Deleted

## 2011-03-13 DIAGNOSIS — I359 Nonrheumatic aortic valve disorder, unspecified: Secondary | ICD-10-CM

## 2011-03-13 DIAGNOSIS — I4891 Unspecified atrial fibrillation: Secondary | ICD-10-CM

## 2011-03-13 DIAGNOSIS — Z9889 Other specified postprocedural states: Secondary | ICD-10-CM

## 2011-03-13 DIAGNOSIS — G459 Transient cerebral ischemic attack, unspecified: Secondary | ICD-10-CM

## 2011-03-13 LAB — POCT INR: INR: 3.9

## 2011-03-15 NOTE — Assessment & Plan Note (Signed)
[facility]                            CARDIOLOGY OFFICE NOTE   NAME:WARREN, [name]                       MRN:          [id number]  DATE:09/22/2006                            DOB:          1941-07-14    Beth Miller returns today for followup. Since I last saw her, her sleepiness  is better; however, her memory continues to be poor both by history and  to my exam. She stopped her Aricept due to her sleepiness. Her camps are  improved. When I last saw her, her potassium was 4 and she continues to  be on replacement. From a cardiac perspective she stable. She is on  Coumadin for chronic A fib and a mitral valve replacement. Her mitral  valve replacement, I believe, is close to 72 years old. She needs a  followup echo. She was supposed to have one in 2006 and did not show for  it. Her last one, I believe, was in 2004 and at the time her mitral  valve prosthesis was functioning well and her EF was grossly normal.   From a cardiac perspective review of systems remarkable for no bleeding  diathesis, no TIAs, no palpitations, no syncope. She does not have a  history of coronary disease. Her last Myoview was in 2004 and was  nonischemic.   Her St. Jude mitral valve replacement was done in 1995.   MEDICATIONS:  1. Coumadin as directed.  2. Lanoxin 0.25 a day.  3. K-Dur 20 a day.  4. Lasix 40 a day.  5. Verapamil 240 q.h.s.  6. She is also taking Skelaxin, I believe, b.i.d.   PHYSICAL EXAMINATION:  HEENT:  Normal.  VITAL SIGNS:  Blood pressure is 120/70, pulse is 58 and irregular. There  is no thyromegaly, no lymphadenopathy, no carotid bruits.  LUNGS:  Clear.  CARDIAC:  There is an S1 click with an S2. There is no diastolic rumble.  There is no MR.  ABDOMEN:  Benign.  EXTREMITIES:  Lower extremity intact pulses, no edema.   EKG shows A fib with DIG effect.   IMPRESSION:  Stable chronic A fib, good anticoagulation on Coumadin.  Good rate control  verapamil.   The patient will have a followup echocardiogram to reassess her mitral  valve. She will continue SBE prophylaxis.   I will see her back in about 6 months. She will get reestablished with  Dr. Posey Rea and our Coumadin clinic here since she is moving back to  [facility].     [name]. [name], MD, [facility]  Electronically Signed    PCN/MedQ  DD: 09/22/2006  DT: 09/22/2006  Job #: [id number]

## 2011-03-15 NOTE — Procedures (Signed)
NAME:  Beth Miller, GENE NO.:  [id number]   MEDICAL RECORD NO.:  [id number]          PATIENT TYPE:  OUT   LOCATION:  SLEEP CENTER                 FACILITY:  [facility]   PHYSICIAN:  Clinton D. Maple Hudson, M.D. DATE OF BIRTH:  May 25, 1941   DATE OF STUDY:  DATE OF DISCHARGE:  07/31/2004                              NOCTURNAL POLYSOMNOGRAM   REFERRING PHYSICIAN:  Jefry H. Pollyann Kennedy, M.D.   INDICATION FOR STUDY/HISTORY:  Hypersomnia with sleep apnea.  Previous NPSG  February 05, 2001, RDI 19 per hour.  CPAP titrated Mar 03, 2001, to 14 CWP.  She  had been unable to tolerate CPAP because of nasal congestion and returns now  for re-evaluation.   Epworth sleepiness score 14/24, BMI 33, weight 184 pounds (up from 169  pounds in 2002).   SLEEP ARCHITECTURE:  Total sleep time 364 minutes (90% sleep efficiency).  Stage 1 was 3%, stage 2 was 81%, stages 3 and 4 were absent.  REM was 16% of  total sleep time.  Latency to sleep onset  22 minutes.  Latency to REM was  110 minutes.  Awake after sleep onset 18 minutes.  Arousal index 21 per  hour.  No sleep medications were taken.   RESPIRATORY DATA:  NPSG protocol.  RDI 49.8 per hour reflecting 15 central  apneas, 202 obstructive apneas, 85 hypopneas.  Events were not positional.  REM RDI was 58.   OXYGEN DATA:  Moderate snoring with desaturation to 83%.  Mean oxygen  saturation on room air across the study was 95%.   CARDIAC DATA:  Atrial fibrillation ranging 64-87 beats per minute.   MOVEMENT/PARASOMNIA:  37 limb jerks with an arousal average of 1.3 per hour  which is not likely to be significant.   IMPRESSION/RECOMMENDATION:  Moderately severe obstructive sleep  apnea/hypopnea syndrome, RDI 49.8 per hour with desaturation to 83% on room  air.  Atrial fibrillation with a controlled ventricular response rate, 64-87  per minute.  She might be able to tolerate CPAP titration using a full face  mask if effective improvement of her  nasopharyngeal airway could not be  obtained otherwise.     CDY/MEDQ  D:  08/05/2004 10:18:46  T:  08/06/2004 10:08:37  Job:  [id number]

## 2011-03-15 NOTE — Assessment & Plan Note (Signed)
[facility]                            CARDIOLOGY OFFICE NOTE   NAME:Beth Miller, Beth Miller                      MRN:          [id number]  DATE:03/02/2007                            DOB:          [birth year]    Beth Miller returns today for followup and we follow her for chronic afib and  a prosthetic mitral valve.  I believe her valve was placed by Dr.  Tyrone Sage 13 years ago.  She does not have concomitant coronary disease.   The patient has been doing fairly well.  She has some issues with her  healthcare in terms of payments.  When I last saw her we arranged for  her to follow up with Dr. Posey Rea since she moved back from  [place] to [place].  She had complained about the cost of  checking her Coumadin in our clinic, apparently there is a $30 co-pay.  She has not had her Coumadin checked in some time.  In the past her  Coumadin has been difficult to control as well.   In regards to her afib, she has not had any significant palpitations,  chest pain, PND or orthopnea.  There has been no syncope or evidence of  slow rates.   In regards to her Coumadin, there has been no TIA or CVA.  She has not  had any bleeding diaphysis.  Apparently there is a distant history of  hematuria that has not recurred.   I talked to Natalie at length in regards to her high risk for stroke given  her afib and old mitral valve.  She understands the need for at least  every 4 week Coumadin checks.  She is willing to have old-fashioned IV  draws at Dr. Loren Racer office, since they are less expensive.   Her review of systems remarkable for some carpal tunnel type symptoms in  the right hand.  She recently got a splint from Dr. Posey Rea that seems  to help.  There is primarily paresthesias and not so much weakness.   She has been compliant with her meds.  She is currently taking Coumadin  10 mg a day, Lanoxin 0.25 a day, K-Dur 20 a day, Lasix 40 a day, and  Cardizem 240 a  day.   EXAM:  Is remarkable for a  healthy-appearing, overweight, black female  in no distress.  She is afebrile.  Her weight is up from 184 pounds to  197 pounds, blood pressure is 108/74, pulse is 60 and irregular.  HEENT:  Normal.  There is no thyromegaly, no JVP elevation, no carotid  bruits.  Cardiac: There is an S1 click with an S2.  There is no diastolic rumble  or MR.  PMI is not palpable.  Lungs are clear.  There is no wheezing,  there is no accessory muscle use.  ABDOMEN:  Benign.  There is no hepatosplenomegaly, no AAA and no  hepatojugular reflux and no masses.  DISTAL PULSES:  Intact with no edema.  SKIN:  Has a rash or scar on the volar aspect of the left hand.  She had  a recent  second degree burn from some oil.  This was treated by Dr.  Posey Rea.  MUSCULAR EXAM:  Normal.  There is no weakness.  Trigeminal nerves are  normal with no evidence of recent stroke.   IMPRESSION:  1. Chronic atrial fibrillation, good rate control on digoxin and      Cardizem.  2. Mitral valve replacement about 13 years ago.  Her echocardiogram      from December, 2007, did show decrease in left ventricular function      compared to 2004.  We need to follow this up in about 6 months, it      may be from her chronic atrial fibrillation.   She certainly does not have congestive heart failure on exam.  She will  get her Coumadin level checked today and we will try to make  arrangements to get it followed more closely.  Her echo from 2007 showed  that her valve was working fine.  There is a mean gradient of only 2  mmHg and no periprosthetic leak.  Overall, I think that she is fairly  lucky that her valve has lasted so long without abnormalities.  Her  dentition is in reasonable shape.   I will see her back in 6 months and we will reassess her LV function at  that time.     Beth Pick. [name], MD, [facility]  Electronically Signed    PCN/MedQ  DD: 03/02/2007  DT: 03/02/2007  Job #: [id number]

## 2011-03-15 NOTE — Discharge Summary (Signed)
[facility]  Patient:    Beth Miller, Beth Miller                       MRN: [id number] Proc. Date: 01/02/01 Adm. Date:  [id number] Disc. Date: [id number] Attending:  Tresa Garter                           Discharge Summary  ADMISSION DIAGNOSES: 1. Transient ischemic attack, likely embolic. 2. Mitral valve replacement, on Coumadin therapy. 3. Hypertension. 4. Atrial fibrillation.  DISCHARGE DIAGNOSES: 1. Cerebrovascular accident in distribution of the left parietal lobe    without residual neurologic findings. 2. Hypertension. 3. Atrial fibrillation. 4. Chronic anticoagulation.  HOSPITAL COURSE:  The patient is a 70 year old, black female, patient of Dr. Trinna Post Plotnikov, who was admitted for evaluation of a TIA-like event.  The patient had transient neurological complaint of right-sided association without real weakness.  It lasted approximately a half a day.  She presented to her doctors office. She was admitted because her INR was subtherapeutic.  In the hospital, her INR was found to be 2.2 and she had no focal neurological findings on examination. An echocardiogram was performed to rule out vegetations and/or valvular dysfunction which was not seen and a carotid Doppler was obtained.  Apparently, the most likely etiology is subtherapeutic anticoagulation secondary to the patients size, mass and diet.  Her diet was reviewed with her.  She was instructed to avoid foods that affeCT Coumadin level are her target INR would now run high from 3 to 3.5.  This was discussed with Dr. Posey Rea and neurologist, Dr. Orlin Hilding.  The patient is discharged to home in stable condition with followup with Dr. Posey Rea in two weeks.  She will have a followup visit at the Coumadin clinic on Monday with target INR from 3 to 3.5.  DD:  01/02/01 TD:  01/03/01 Job: [id number]

## 2011-03-15 NOTE — H&P (Signed)
[facility]  Patient:    Beth Miller, Beth Miller                         MRN: [id number] Adm. Date:  01/02/01 Attending:  Sonda Primes, M.D. [facility] CC:         [name]. [name], M.D. [facility]                         History and Physical  DATE OF BIRTH:  September 28, 1941  CHIEF COMPLAINT:  Feeling strange yesterday.  HISTORY OF PRESENT ILLNESS:  The patient is a 70 year old black female who presents today to the office with the complaint of feeling disoriented on March 6 in the morning.  She states that her mind was seeing everything in the opposite way.  She was working with her left hand and it seemed to her like she was doing stuff with her right hand.  Her thinking was a little bizarre and she felt a little off balance.  There was no weakness or numbness.  No syncope.  Lasted for several hours and later resolved.  She thinks that at one time her left hand could have felt somewhat weak.  CURRENT MEDICATIONS: 1. Lasix 20 mg q.d. 2. Coumadin 10 mg starting today for three days, then 7.5 mg. 3. Premarin 1.25 mg q.d. 4. Cardizem 240 mg q.d. 5. Lanoxin 0.25 mg q.d. 6. Coreg 3.125 mg q.d. 7. K-Dur 20 mEq q.d.  PAST MEDICAL HISTORY:  Anticoagulation, chronic, status post St. Jude valve replacement in 1995, partial hysterectomy, hematuria, migraine headaches, allergic rhinitis.  ALLERGIES:  SULFA, SHELLFISH, QUINIDINE.  SOCIAL HISTORY:  She is married.  She lives with 46 year old daughter who is currently unemployed and depressed.  FAMILY HISTORY:  Unknown.  REVIEW OF SYSTEMS:  Negative for chest pain, otherwise negative or as above.  PHYSICAL EXAMINATION  GENERAL:  She is in no acute distress.  VITAL SIGNS:  Blood pressure 126/84, pulse 56, temperature 98.5, weight 180 pounds.  HEENT:  Moist mucosa.  NECK:  Supple.  No thyromegaly or bruits.  LUNGS:  Clear to auscultation and percussion.  HEART:  S1, S2.  No enlargement to percussion.  Increased valve  sound.  ABDOMEN:  Soft, nontender.  No bruit.  EXTREMITIES:  Lower extremities without edema.  NEUROLOGIC:  Cranial nerves 2-12 normal.  Deep tendon reflexes strength normal.  She is alert, oriented, and cooperative.  Denies being depressed.  LABORATORIES:  INR 1.7 this morning.  IMPRESSION: 1. Apraxia yesterday morning likely related to a transient ischemic attack    with some left hand weakness.  Obtain MRI of the brain.  Admit for    intravenous heparin, cardiac echocardiogram.  May need a neurology consult.    Continue Coumadin. 2. Status post mitral valve replacement.  Continue Coumadin. 3. Atrial fibrillation, rate controlled. 4. Hypertension, controlled. 5. Anticoagulation subtherapeutic.  Will cover with intravenous heparin until    she is therapeutic on her Coumadin. DD:  01/01/01 TD:  01/02/01 Job: [id number]

## 2011-03-15 NOTE — Consult Note (Signed)
NAME:  Beth, Miller NO.:  [id number]   MEDICAL RECORD NO.:  [id number]          PATIENT TYPE:  EMS   LOCATION:  [facility]                         FACILITY:  [facility]   PHYSICIAN:  Rosalyn Gess. Norins, MD  DATE OF BIRTH:  1941/03/18   DATE OF CONSULTATION:  01/26/2007  DATE OF DISCHARGE:  01/26/2007                                 CONSULTATION   REFERRING PHYSICIAN:  Dr. Devoria Albe.   REASON FOR CONSULTATION:  I have been asked by Dr. Devoria Albe to see and  evaluate the patient for possible TIA.   HISTORY OF PRESENT ILLNESS:  [name] is a 70 year old married  African American female with a history of a St. Jude mitral valve.  The  patient is fully anticoagulated with her last visit to the coagulation  clinic January 19, 2007, when she was thought to be slightly under-  anticoagulated with an INR I believe of 1.9.  The patient does have  history of embolic TIA in the past.  Today after breakfast, the patient reported she had an area of a  vertical strip by the side of her nose on the right that became numb.  She timed this and it lasted about 7 minutes.  She reports no associated  symptoms, specifically denying any facial droop, double vision, weakness  or loss of sensation loss, loss of balance loss and loss of cognitive  function.  Since this morning, this has not recurred.  At the time of  this examination, the patient is symptom-free and she reports she has  been symptom-free for several hours.   PAST MEDICAL HISTORY:   SURGICAL:  1. The patient had a hysterectomy.  2. She has had St. Jude mitral valve replacement in 1995.   MEDICAL:  1. The patient has history of hematuria.  2. Migraine headaches.  3. Allergic rhinitis.  4. Embolic TIA in the past.  5. Some sleepiness and drowsiness.  She had been on Aricept in the      past for unclear reasons.  6. She has history of atrial fibrillation.   CURRENT MEDICATIONS:  1. Coumadin as directed.  2. Lanoxin 0.25  mg daily.  3. Potassium 20 mEq daily.  4. Lasix 40 mg daily.  5. Verapamil 240 mg nightly.  6. Skelaxin b.i.d. p.r.n.   FAMILY HISTORY:  Unknown and noncontributory.   SOCIAL HISTORY:  The patient is married.  She has a supportive family.  She has a daughter who lives with her.   REVIEW OF SYSTEMS:  Negative for any HEENT complaints.  No  cardiovascular complaints.  No pulmonary complaints.  No GI complaints.   EXAMINATION:  VITAL SIGNS:  Temperature is 97.4, blood pressure was  127/79, heart rate 75, respirations 18.  GENERAL APPEARANCE:  This a well-nourished, well-developed African  American woman sitting in bed in no acute distress.  HEENT:  Normocephalic, atraumatic.  Oral cavity without lesions.  Conjunctivae  and sclerae were clear.  NECK:  Supple.  CHEST:  Clear.  CARDIOVASCULAR:  2+ radial pulse.  She had a quiet precordium.  She had  a regular rate and rhythm to my exam.  ABDOMEN:  Obese, soft and nontender.  NEUROLOGIC:  The patient is awake, alert, oriented to person, place,  time and context.  Her speech is clear.  She is a good historian.  She  has no cognitive deficits.  Cranial nerves II-XII are grossly intact  with normal facial symmetry and movement.  Extraocular muscles were  intact.  Pupils were equal and round; they did reactive symmetrically to  light.  The patient had no deviation of the tongue or uvula.  She had a  normal shoulder shrug.  Motor strength:  The patient had 5/5 motor  strength throughout.  Cerebellar function:  The patient had no tremor.  She was able to sit and stand without assistance.  She had negative  Romberg.  She is able to ambulate without difficulty.  She could  executed a turn without difficulty.   DATA BASE:  Hemoglobin was 13.9 g.  Sodium was 139, potassium 4.0,  chloride 106, BUN 11, creatinine was 1.0, glucose was 98.  Digoxin 0.5.  INR was 2.8.   CT scan of the brain was performed and showed no acute abnormality or   changes.   ASSESSMENT AND PLAN:  Paresthesia:  Patient with unexplained  paresthesia.  There is no evidence of a transient ischemic attack or  stroke-like symptoms on exam.  Her history is questionable for any kind  of meaningful injury.  The patient is fully anticoagulated at this time.   RECOMMENDATIONS:  I would recommend the patient be discharged home.  She  can follow up with Dr. Sonda Primes, her primary care physician.  If  not done previously, she would be a candidate for carotid Dopplers.  She  should also discuss with Dr. Posey Rea her hot flashes and paresthesias.   COMMENT:  Thirty minutes were spent this evaluation.      Rosalyn Gess Norins, MD  Electronically Signed     MEN/MEDQ  D:  01/26/2007  T:  01/27/2007  Job:  [id number]

## 2011-05-15 ENCOUNTER — Other Ambulatory Visit (INDEPENDENT_AMBULATORY_CARE_PROVIDER_SITE_OTHER): Payer: Medicare Other

## 2011-05-15 ENCOUNTER — Other Ambulatory Visit: Payer: Medicare Other

## 2011-05-15 ENCOUNTER — Other Ambulatory Visit: Payer: Self-pay | Admitting: Internal Medicine

## 2011-05-15 DIAGNOSIS — M545 Low back pain, unspecified: Secondary | ICD-10-CM

## 2011-05-15 DIAGNOSIS — Z0000 Encounter for general adult medical examination without abnormal findings: Secondary | ICD-10-CM

## 2011-05-15 DIAGNOSIS — I359 Nonrheumatic aortic valve disorder, unspecified: Secondary | ICD-10-CM

## 2011-05-15 DIAGNOSIS — Z9889 Other specified postprocedural states: Secondary | ICD-10-CM

## 2011-05-15 DIAGNOSIS — I4891 Unspecified atrial fibrillation: Secondary | ICD-10-CM

## 2011-05-15 DIAGNOSIS — E119 Type 2 diabetes mellitus without complications: Secondary | ICD-10-CM

## 2011-05-15 DIAGNOSIS — R209 Unspecified disturbances of skin sensation: Secondary | ICD-10-CM

## 2011-05-15 DIAGNOSIS — G459 Transient cerebral ischemic attack, unspecified: Secondary | ICD-10-CM

## 2011-05-15 LAB — CBC WITH DIFFERENTIAL/PLATELET
Basophils Absolute: 0.1 10*3/uL (ref 0.0–0.1)
Eosinophils Absolute: 0 10*3/uL (ref 0.0–0.7)
Lymphocytes Relative: 38.7 % (ref 12.0–46.0)
MCHC: 33.9 g/dL (ref 30.0–36.0)
Monocytes Relative: 12.9 % — ABNORMAL HIGH (ref 3.0–12.0)
Neutrophils Relative %: 46.3 % (ref 43.0–77.0)
RBC: 4.5 Mil/uL (ref 3.87–5.11)
RDW: 15.3 % — ABNORMAL HIGH (ref 11.5–14.6)

## 2011-05-15 LAB — LIPID PANEL
Cholesterol: 116 mg/dL (ref 0–200)
HDL: 46.5 mg/dL (ref >39.00)
LDL Cholesterol: 54 mg/dL (ref 0–99)
Triglycerides: 77 mg/dL (ref 0.0–149.0)

## 2011-05-15 LAB — URINALYSIS, ROUTINE W REFLEX MICROSCOPIC
Specific Gravity, Urine: 1.01 (ref 1.000–1.030)
Total Protein, Urine: NEGATIVE
Urine Glucose: NEGATIVE
Urobilinogen, UA: 0.2 (ref 0.0–1.0)
pH: 5.5 (ref 5.0–8.0)

## 2011-05-15 LAB — BASIC METABOLIC PANEL
CO2: 29 mEq/L (ref 19–32)
Chloride: 102 mEq/L (ref 96–112)
Creatinine, Ser: 1 mg/dL (ref 0.4–1.2)
Sodium: 139 mEq/L (ref 135–145)

## 2011-05-15 LAB — HEPATIC FUNCTION PANEL
AST: 25 U/L (ref 0–37)
Albumin: 4.1 g/dL (ref 3.5–5.2)
Alkaline Phosphatase: 70 U/L (ref 39–117)
Total Protein: 7.9 g/dL (ref 6.0–8.3)

## 2011-05-15 LAB — HEMOGLOBIN A1C: Hgb A1c MFr Bld: 6.8 % — ABNORMAL HIGH (ref 4.6–6.5)

## 2011-05-15 LAB — PROTIME-INR: Prothrombin Time: 47.6 s — ABNORMAL HIGH (ref 10.2–12.4)

## 2011-05-16 ENCOUNTER — Telehealth: Payer: Self-pay | Admitting: Internal Medicine

## 2011-05-16 LAB — VITAMIN B12: Vitamin B-12: >1500 pg/mL — ABNORMAL HIGH (ref 211–911)

## 2011-05-16 LAB — TSH: TSH: 2.22 u[IU]/mL (ref 0.35–5.50)

## 2011-05-16 NOTE — Telephone Encounter (Signed)
Beth Miller, please, see if she goes to [facility]

## 2011-05-17 NOTE — Telephone Encounter (Signed)
Based on pts chart review summary it appears she does go to coumadin clinic and her last visit there was 03-13-11.

## 2011-05-18 NOTE — Telephone Encounter (Signed)
Coum management per coum clinic Thx

## 2011-05-21 NOTE — Telephone Encounter (Signed)
Per Dr. Posey Rea- left detailed mess informing pt to take Coumadin 12.5mg  qd except Mondays and take 15mg  on Mondays. Recheck INR in 2 wks.

## 2011-05-21 NOTE — Telephone Encounter (Signed)
I spoke to pt. She states she is not going back to coumadin clinic because they are too expensive. Please advise what does coumadin does pt need? INR is 5.0

## 2011-05-21 NOTE — Telephone Encounter (Signed)
Pt states she has been taking Coumadin 15mg  on Mon/Fri and 12.5mg  on the other days of the week. Please advise.

## 2011-05-22 ENCOUNTER — Ambulatory Visit (INDEPENDENT_AMBULATORY_CARE_PROVIDER_SITE_OTHER): Payer: Medicare Other | Admitting: Internal Medicine

## 2011-05-22 ENCOUNTER — Encounter: Payer: Self-pay | Admitting: Internal Medicine

## 2011-05-22 VITALS — BP 114/80 | HR 80 | Temp 97.8°F | Resp 16 | Ht 62.0 in | Wt 178.0 lb

## 2011-05-22 DIAGNOSIS — E119 Type 2 diabetes mellitus without complications: Secondary | ICD-10-CM

## 2011-05-22 DIAGNOSIS — M545 Low back pain, unspecified: Secondary | ICD-10-CM

## 2011-05-22 DIAGNOSIS — IMO0002 Reserved for concepts with insufficient information to code with codable children: Secondary | ICD-10-CM

## 2011-05-22 DIAGNOSIS — Z9889 Other specified postprocedural states: Secondary | ICD-10-CM

## 2011-05-22 DIAGNOSIS — Z0000 Encounter for general adult medical examination without abnormal findings: Secondary | ICD-10-CM | POA: Insufficient documentation

## 2011-05-22 DIAGNOSIS — Z7901 Long term (current) use of anticoagulants: Secondary | ICD-10-CM | POA: Insufficient documentation

## 2011-05-22 DIAGNOSIS — G459 Transient cerebral ischemic attack, unspecified: Secondary | ICD-10-CM

## 2011-05-22 MED ORDER — POTASSIUM CHLORIDE CRYS ER 20 MEQ PO TBCR: 20.0000 meq | EXTENDED_RELEASE_TABLET | Freq: Every day | ORAL | Status: DC

## 2011-05-22 MED ORDER — WARFARIN SODIUM 5 MG PO TABS: 5.0000 mg | ORAL_TABLET | ORAL | Status: DC

## 2011-05-22 MED ORDER — DILTIAZEM HCL ER COATED BEADS 240 MG PO CP24: 240.0000 mg | ORAL_CAPSULE | Freq: Every day | ORAL | Status: DC

## 2011-05-22 MED ORDER — DIGOXIN 125 MCG PO TABS: 125.0000 ug | ORAL_TABLET | Freq: Every day | ORAL | Status: DC

## 2011-05-22 MED ORDER — WARFARIN SODIUM 10 MG PO TABS: 10.0000 mg | ORAL_TABLET | ORAL | Status: DC

## 2011-05-22 MED ORDER — FUROSEMIDE 40 MG PO TABS: 40.0000 mg | ORAL_TABLET | Freq: Every day | ORAL | Status: DC

## 2011-05-22 NOTE — Progress Notes (Signed)
  Subjective:    Patient ID: Beth Miller, female    DOB: 1941/08/14, 70 y.o.   MRN: [id number]  HPI   The patient is here for a wellness exam. The patient has been doing well overall without major physical or psychological issues going on lately. The patient needs to address  chronic hypertension that has been well controlled with medicines; to address chronic  hyperlipidemia controlled with medicines as well; s/p MVR, controlled with medical treatment and diet.  Review of Systems  Constitutional: Negative for fever, chills, diaphoresis, activity change, appetite change, fatigue and unexpected weight change.  HENT: Negative for hearing loss, ear pain, congestion, sore throat, sneezing, mouth sores, neck pain, dental problem, voice change, postnasal drip and sinus pressure.   Eyes: Negative for pain and visual disturbance.  Respiratory: Negative for cough, chest tightness, wheezing and stridor.   Cardiovascular: Negative for chest pain, palpitations and leg swelling.  Gastrointestinal: Negative for nausea, vomiting, abdominal pain, blood in stool, abdominal distention and rectal pain.  Genitourinary: Negative for dysuria, hematuria, decreased urine volume, vaginal bleeding, vaginal discharge, difficulty urinating, vaginal pain and menstrual problem.  Musculoskeletal: Positive for arthralgias. Negative for back pain, joint swelling and gait problem.  Skin: Negative for color change, rash and wound.  Neurological: Negative for dizziness, tremors, syncope, speech difficulty and light-headedness.  Hematological: Negative for adenopathy.  Psychiatric/Behavioral: Negative for suicidal ideas, hallucinations, behavioral problems, confusion, sleep disturbance, dysphoric mood and decreased concentration. The patient is not hyperactive.        Objective:   Physical Exam  Constitutional: She appears well-developed and well-nourished. No distress.  HENT:  Head: Normocephalic.  Right Ear: External  ear normal.  Left Ear: External ear normal.  Nose: Nose normal.  Mouth/Throat: Oropharynx is clear and moist.  Eyes: Conjunctivae are normal. Pupils are equal, round, and reactive to light. Right eye exhibits no discharge. Left eye exhibits no discharge.  Neck: Normal range of motion. Neck supple. No JVD present. No tracheal deviation present. No thyromegaly present.  Cardiovascular: Normal rate, regular rhythm and normal heart sounds.  Exam reveals no gallop and no friction rub.   No murmur (click) heard. Pulmonary/Chest: No stridor. No respiratory distress. She has no wheezes.  Abdominal: Soft. Bowel sounds are normal. She exhibits no distension and no mass. There is no tenderness. There is no rebound and no guarding.  Musculoskeletal: She exhibits no edema and no tenderness.  Lymphadenopathy:    She has no cervical adenopathy.  Neurological: She displays normal reflexes. No cranial nerve deficit. She exhibits normal muscle tone. Coordination normal.  Skin: No rash noted. No erythema.  Psychiatric: She has a normal mood and affect. Her behavior is normal. Judgment and thought content normal.          Assessment & Plan:

## 2011-05-22 NOTE — Assessment & Plan Note (Signed)
No relapse 

## 2011-05-22 NOTE — Assessment & Plan Note (Signed)
Risks associated with testing/treatment noncompliance were discussed. Compliance was encouraged.

## 2011-05-22 NOTE — Assessment & Plan Note (Signed)
Coum dose adjusted INR in 2 wks

## 2011-05-22 NOTE — Assessment & Plan Note (Signed)
On Rx 

## 2011-05-22 NOTE — Assessment & Plan Note (Signed)
Lab Results  Component Value Date   WBC 4.6 05/15/2011   HGB 12.7 05/15/2011   HCT 37.5 05/15/2011   PLT 282.0 05/15/2011   CHOL 116 05/15/2011   TRIG 77.0 05/15/2011   HDL 46.50 05/15/2011   ALT 18 05/15/2011   AST 25 05/15/2011   NA 139 05/15/2011   K 4.0 05/15/2011   CL 102 05/15/2011   CREATININE 1.0 05/15/2011   BUN 15 05/15/2011   CO2 29 05/15/2011   TSH 2.22 05/15/2011   INR 5.0* 05/15/2011   HGBA1C 6.8* 05/15/2011

## 2011-05-22 NOTE — Patient Instructions (Signed)
Wt Readings from Last 3 Encounters:  05/22/11 178 lb (80.74 kg)  01/14/11 182 lb (82.555 kg)  09/17/10 181 lb (82.101 kg)

## 2011-06-21 ENCOUNTER — Other Ambulatory Visit (INDEPENDENT_AMBULATORY_CARE_PROVIDER_SITE_OTHER): Payer: Medicare Other

## 2011-06-21 DIAGNOSIS — G459 Transient cerebral ischemic attack, unspecified: Secondary | ICD-10-CM

## 2011-06-21 DIAGNOSIS — Z7901 Long term (current) use of anticoagulants: Secondary | ICD-10-CM

## 2011-06-21 DIAGNOSIS — E119 Type 2 diabetes mellitus without complications: Secondary | ICD-10-CM

## 2011-06-21 LAB — BASIC METABOLIC PANEL
BUN: 15 mg/dL (ref 6–23)
Chloride: 107 mEq/L (ref 96–112)
Glucose, Bld: 167 mg/dL — ABNORMAL HIGH (ref 70–99)
Potassium: 3.7 mEq/L (ref 3.5–5.1)

## 2011-06-24 ENCOUNTER — Telehealth: Payer: Self-pay | Admitting: Internal Medicine

## 2011-06-24 NOTE — Telephone Encounter (Signed)
Beth Miller , please, inform the patient: INR is OK. Take Coumadin as before. Recheck INR in 4 weeks. Thank you !

## 2011-06-26 NOTE — Telephone Encounter (Signed)
Pt informed

## 2011-06-26 NOTE — Telephone Encounter (Signed)
Left mess with a female to have pt call me back.

## 2011-08-12 ENCOUNTER — Ambulatory Visit: Payer: Medicare Other

## 2011-08-12 DIAGNOSIS — I4891 Unspecified atrial fibrillation: Secondary | ICD-10-CM

## 2011-08-12 DIAGNOSIS — G459 Transient cerebral ischemic attack, unspecified: Secondary | ICD-10-CM

## 2011-08-12 DIAGNOSIS — I359 Nonrheumatic aortic valve disorder, unspecified: Secondary | ICD-10-CM

## 2011-08-12 DIAGNOSIS — Z9889 Other specified postprocedural states: Secondary | ICD-10-CM

## 2011-08-15 ENCOUNTER — Telehealth: Payer: Self-pay | Admitting: Internal Medicine

## 2011-08-15 NOTE — Telephone Encounter (Signed)
Left detailed mess informing pt of below.  

## 2011-08-15 NOTE — Telephone Encounter (Signed)
Left mess for patient to call back.  

## 2011-08-15 NOTE — Telephone Encounter (Signed)
Pt states she is taking 12.5 mg qd except on Monday she takes 15 mg.

## 2011-08-15 NOTE — Telephone Encounter (Signed)
Misty Stanley, please, what is her coumadin dose? Thx

## 2011-08-15 NOTE — Telephone Encounter (Signed)
Hold coum x1 d Coum 12.5 mg qd INR in 2 wks Thx

## 2011-09-02 ENCOUNTER — Ambulatory Visit: Payer: Medicare Other

## 2011-09-02 DIAGNOSIS — I359 Nonrheumatic aortic valve disorder, unspecified: Secondary | ICD-10-CM

## 2011-09-02 DIAGNOSIS — Z9889 Other specified postprocedural states: Secondary | ICD-10-CM

## 2011-09-02 DIAGNOSIS — G459 Transient cerebral ischemic attack, unspecified: Secondary | ICD-10-CM

## 2011-09-02 DIAGNOSIS — I4891 Unspecified atrial fibrillation: Secondary | ICD-10-CM

## 2011-09-02 LAB — PROTIME-INR
INR: 2.7 ratio — ABNORMAL HIGH (ref 0.8–1.0)
Prothrombin Time: 29.8 s — ABNORMAL HIGH (ref 10.2–12.4)

## 2011-09-03 ENCOUNTER — Telehealth: Payer: Self-pay | Admitting: Internal Medicine

## 2011-09-03 NOTE — Telephone Encounter (Signed)
Beth Miller , please, inform the patient: INR is OK. Take Coumadin as before. Recheck INR in 4 weeks. Thank you !  

## 2011-09-03 NOTE — Telephone Encounter (Signed)
Left mess for patient to call back.  

## 2011-09-04 NOTE — Telephone Encounter (Signed)
Pt informed

## 2011-09-12 ENCOUNTER — Encounter: Payer: Self-pay | Admitting: Internal Medicine

## 2011-09-12 ENCOUNTER — Ambulatory Visit (INDEPENDENT_AMBULATORY_CARE_PROVIDER_SITE_OTHER): Payer: Medicare Other | Admitting: Internal Medicine

## 2011-09-12 VITALS — BP 110/80 | HR 76 | Temp 98.1°F | Resp 16 | Wt 188.0 lb

## 2011-09-12 DIAGNOSIS — Z7901 Long term (current) use of anticoagulants: Secondary | ICD-10-CM

## 2011-09-12 DIAGNOSIS — Z23 Encounter for immunization: Secondary | ICD-10-CM

## 2011-09-12 DIAGNOSIS — I10 Essential (primary) hypertension: Secondary | ICD-10-CM

## 2011-09-12 DIAGNOSIS — M25569 Pain in unspecified knee: Secondary | ICD-10-CM

## 2011-09-12 DIAGNOSIS — I4891 Unspecified atrial fibrillation: Secondary | ICD-10-CM

## 2011-09-12 DIAGNOSIS — E119 Type 2 diabetes mellitus without complications: Secondary | ICD-10-CM

## 2011-09-12 MED ORDER — FLUTICASONE PROPIONATE 50 MCG/ACT NA SUSP: 1.0000 | Freq: Every day | NASAL | Status: DC

## 2011-09-12 MED ORDER — OMEPRAZOLE 10 MG PO CPDR: 10.0000 mg | DELAYED_RELEASE_CAPSULE | Freq: Every day | ORAL | Status: DC

## 2011-09-12 NOTE — Patient Instructions (Signed)
Wt Readings from Last 3 Encounters:  09/12/11 188 lb (85.276 kg)  05/22/11 178 lb (80.74 kg)  01/14/11 182 lb (82.555 kg)   Lab Results  Component Value Date   INR 2.7* 09/02/2011   INR 4.1* 08/12/2011   INR 3.2* 06/21/2011

## 2011-09-12 NOTE — Assessment & Plan Note (Signed)
  On diet  

## 2011-09-12 NOTE — Progress Notes (Signed)
  Subjective:    Patient ID: Beth Miller, female    DOB: 03-Aug-1941, 70 y.o.   MRN: [id number]  HPI  The patient presents for a follow-up of  chronic hypertension, chronic dyslipidemia, anticoagulation, controlled with medicines. F/u OA, R knee pain  Wt Readings from Last 3 Encounters:  09/12/11 188 lb (85.276 kg)  05/22/11 178 lb (80.74 kg)  01/14/11 182 lb (82.555 kg)    Review of Systems  Constitutional: Negative for chills, activity change, appetite change, fatigue and unexpected weight change.  HENT: Negative for congestion, mouth sores and sinus pressure.   Eyes: Negative for visual disturbance.  Respiratory: Negative for cough and chest tightness.   Gastrointestinal: Negative for nausea and abdominal pain.  Genitourinary: Negative for frequency, difficulty urinating and vaginal pain.  Musculoskeletal: Positive for arthralgias (R knee). Negative for back pain and gait problem.  Skin: Negative for pallor and rash.  Neurological: Negative for dizziness, tremors, weakness, numbness and headaches.  Psychiatric/Behavioral: Negative for confusion and sleep disturbance.       Objective:   Physical Exam  Constitutional: She appears well-developed. No distress.       obese  HENT:  Head: Normocephalic.  Right Ear: External ear normal.  Left Ear: External ear normal.  Nose: Nose normal.  Mouth/Throat: Oropharynx is clear and moist.  Eyes: Conjunctivae are normal. Pupils are equal, round, and reactive to light. Right eye exhibits no discharge. Left eye exhibits no discharge.  Neck: Normal range of motion. Neck supple. No JVD present. No tracheal deviation present. No thyromegaly present.  Cardiovascular: Normal rate and normal heart sounds.        Irregular  Pulmonary/Chest: No stridor. No respiratory distress. She has no wheezes.  Abdominal: Soft. Bowel sounds are normal. She exhibits no distension and no mass. There is no tenderness. There is no rebound and no guarding.    Musculoskeletal: She exhibits tenderness (R knee w/mild tenderness). She exhibits no edema.  Lymphadenopathy:    She has no cervical adenopathy.  Neurological: She displays normal reflexes. No cranial nerve deficit. She exhibits normal muscle tone. Coordination normal.  Skin: No rash noted. No erythema.  Psychiatric: She has a normal mood and affect. Her behavior is normal. Judgment and thought content normal.          Assessment & Plan:

## 2011-09-12 NOTE — Assessment & Plan Note (Signed)
Continue with current diet  

## 2011-09-12 NOTE — Assessment & Plan Note (Signed)
Continue with current prescription therapy as reflected on the Med list.  

## 2011-09-12 NOTE — Assessment & Plan Note (Signed)
Continue with current prescription therapy as reflected on the Med list. Lab Results  Component Value Date   INR 2.7* 09/02/2011   INR 4.1* 08/12/2011   INR 3.2* 06/21/2011

## 2011-10-15 ENCOUNTER — Ambulatory Visit: Payer: Medicare Other | Admitting: Cardiovascular Disease

## 2011-11-01 ENCOUNTER — Other Ambulatory Visit (INDEPENDENT_AMBULATORY_CARE_PROVIDER_SITE_OTHER): Payer: Medicare Other

## 2011-11-01 DIAGNOSIS — I4891 Unspecified atrial fibrillation: Secondary | ICD-10-CM

## 2011-11-01 DIAGNOSIS — E119 Type 2 diabetes mellitus without complications: Secondary | ICD-10-CM

## 2011-11-01 DIAGNOSIS — M25569 Pain in unspecified knee: Secondary | ICD-10-CM

## 2011-11-01 DIAGNOSIS — I10 Essential (primary) hypertension: Secondary | ICD-10-CM

## 2011-11-01 DIAGNOSIS — Z7901 Long term (current) use of anticoagulants: Secondary | ICD-10-CM

## 2011-11-01 LAB — HEPATIC FUNCTION PANEL
Albumin: 4.3 g/dL (ref 3.5–5.2)
Alkaline Phosphatase: 72 U/L (ref 39–117)
Total Protein: 7.8 g/dL (ref 6.0–8.3)

## 2011-11-01 LAB — LIPID PANEL
Cholesterol: 135 mg/dL (ref 0–200)
LDL Cholesterol: 69 mg/dL (ref 0–99)
Total CHOL/HDL Ratio: 3
Triglycerides: 85 mg/dL (ref 0.0–149.0)
VLDL: 17 mg/dL (ref 0.0–40.0)

## 2011-11-01 LAB — BASIC METABOLIC PANEL
CO2: 32 mEq/L (ref 19–32)
Calcium: 9.4 mg/dL (ref 8.4–10.5)
Chloride: 103 mEq/L (ref 96–112)
Glucose, Bld: 103 mg/dL — ABNORMAL HIGH (ref 70–99)
Sodium: 141 mEq/L (ref 135–145)

## 2011-11-01 LAB — TSH: TSH: 2.44 u[IU]/mL (ref 0.35–5.50)

## 2011-11-04 ENCOUNTER — Telehealth: Payer: Self-pay | Admitting: Internal Medicine

## 2011-11-04 ENCOUNTER — Ambulatory Visit (INDEPENDENT_AMBULATORY_CARE_PROVIDER_SITE_OTHER): Payer: Medicare Other | Admitting: Cardiovascular Disease

## 2011-11-04 ENCOUNTER — Encounter: Payer: Self-pay | Admitting: Cardiovascular Disease

## 2011-11-04 DIAGNOSIS — Z7901 Long term (current) use of anticoagulants: Secondary | ICD-10-CM

## 2011-11-04 DIAGNOSIS — Z9889 Other specified postprocedural states: Secondary | ICD-10-CM

## 2011-11-04 DIAGNOSIS — R040 Epistaxis: Secondary | ICD-10-CM

## 2011-11-04 DIAGNOSIS — R0989 Other specified symptoms and signs involving the circulatory and respiratory systems: Secondary | ICD-10-CM

## 2011-11-04 DIAGNOSIS — I10 Essential (primary) hypertension: Secondary | ICD-10-CM

## 2011-11-04 DIAGNOSIS — I4891 Unspecified atrial fibrillation: Secondary | ICD-10-CM

## 2011-11-04 DIAGNOSIS — I6529 Occlusion and stenosis of unspecified carotid artery: Secondary | ICD-10-CM

## 2011-11-04 MED ORDER — DIGOXIN 125 MCG PO TABS: 250.0000 ug | ORAL_TABLET | Freq: Every day | ORAL | Status: DC

## 2011-11-04 MED ORDER — DIGOXIN 250 MCG PO TABS: 250.0000 ug | ORAL_TABLET | Freq: Every day | ORAL | Status: DC

## 2011-11-04 NOTE — Assessment & Plan Note (Signed)
Increase digoxin back to .25mg .  Rate control otherwise ok.

## 2011-11-04 NOTE — Assessment & Plan Note (Signed)
Well controlled.  Continue current medications and low sodium Dash type diet.    

## 2011-11-04 NOTE — Telephone Encounter (Signed)
How much coum is she taking? Thx

## 2011-11-04 NOTE — Telephone Encounter (Signed)
Needs ENT ref for nose bleeds Thx

## 2011-11-04 NOTE — Patient Instructions (Signed)
Your physician wants you to follow-up in: YEAR WITH DR Haywood Filler will receive a reminder letter in the mail two months in advance. If you don't receive a letter, please call our office to schedule the follow-up appointment. Your physician recommends that you continue on your current medications as directed. Please refer to the Current Medication list given to you today. Your physician has requested that you have a carotid duplex. This test is an ultrasound of the carotid arteries in your neck. It looks at blood flow through these arteries that supply the brain with blood. Allow one hour for this exam. There are no restrictions or special instructions.  FEB 2013  DX BRUIT Your physician has requested that you have an echocardiogram. Echocardiography is a painless test that uses sound waves to create images of your heart. It provides your doctor with information about the size and shape of your heart and how well your heart's chambers and valves are working. This procedure takes approximately one hour. There are no restrictions for this procedure.  FEB 2013 DX MVP

## 2011-11-04 NOTE — Assessment & Plan Note (Signed)
F/U coumadin clinic  [facility] INR 3  Needs Lovenox overlap if coumadin stopped

## 2011-11-04 NOTE — Assessment & Plan Note (Signed)
No bruit on exam  F/U duplex

## 2011-11-04 NOTE — Assessment & Plan Note (Signed)
Normal exam  F/U echo 2/13  SBE prophylaxis

## 2011-11-04 NOTE — Progress Notes (Signed)
Beth Miller is seen today in followup for her prosthetic mitral valve, chronic atrial fibrillation, anticoagulation with Coumadin and carotid disease. His been doing well. Her last echo in December which showed good LV function and a normal functioning prostatic valve. He's not had any TIA or CVA-like symptoms. She understands that greens affect her Coumadin level. Her INRs have been therapeutic. I did tell her I thought she was one of these people with chronic A. fib and a mitral mechanical valve that should have Lovenox overlap for stopping her Coumadin. She has not had any significant chest pain PND or orthopnea there've been no lower extremity edema. She has had some hot flashes which sound more postmenopausal in nature. She has not had any significant palpitations or syncope. She's been compliant with her medications.  Saw dentist last week with mild ginival bleeding.  Took SBE prophylaxis.  Hoarse voice to see ENT.  Digoxin got decreased by mistake and she feels  More palpitatoins.    I reviewed her carotid duplex from 11/30/09. She has minimal plaque and no real stenosis. The RICA is estimated at 40-59% only from tortuosity  I reviewed her echo from 11/30/09 and her MVR is funcitoning normally with EF 50% and signiificant biatrial enlargement.   ROS: Denies fever, malais, weight loss, blurry vision, decreased visual acuity, cough, sputum, SOB, hemoptysis, pleuritic pain, palpitaitons, heartburn, abdominal pain, melena, lower extremity edema, claudication, or rash.  All other systems reviewed and negative  General: Affect appropriate Healthy:  appears stated age HEENT: normal Neck supple with no adenopathy JVP normal no bruits no thyromegaly Lungs clear with no wheezing and good diaphragmatic motion Heart:  S1 click /S2 no murmur,rub, gallop or click PMI normal Abdomen: benighn, BS positve, no tenderness, no AAA no bruit.  No HSM or HJR Distal pulses intact with no bruits No edema Neuro  non-focal Skin warm and dry No muscular weakness   Current Outpatient Prescriptions  Medication Sig Dispense Refill  . ACCU-CHEK AVIVA test strip  TEST ONCE DAILY  100 each  3  . amoxicillin (AMOXIL) 500 MG tablet Take four tablets one hour prior to dental procedure  4 tablet  6  . Cholecalciferol (VITAMIN D3) 1000 UNIT tablet Take 1,000 Units by mouth daily.        . Diclofenac Sodium (PENNSAID) 1.5 % SOLN Place 3-5 drops onto the skin 3 (three) times daily. For pain       . digoxin (LANOXIN) 0.125 MG tablet Take 250 mcg by mouth daily.        Marland Kitchen diltiazem (CARDIZEM CD) 240 MG 24 hr capsule Take 1 capsule (240 mg total) by mouth daily.  30 capsule  11  . furosemide (LASIX) 40 MG tablet Take 1 tablet (40 mg total) by mouth daily.  30 tablet  11  . glucosamine-chondroitin 500-400 MG tablet Take 1 tablet by mouth daily.        Marland Kitchen loratadine (CLARITIN) 10 MG tablet Take 10 mg by mouth daily. As needed for allergies       . metaxalone (SKELAXIN) 800 MG tablet Take 800 mg by mouth as needed.        . Omega-3 Fatty Acids (FISH OIL) 1000 MG CAPS Take 1 capsule by mouth daily.        Marland Kitchen omeprazole (PRILOSEC) 10 MG capsule Take 1 capsule (10 mg total) by mouth daily.  30 capsule  1  . potassium chloride SA (K-DUR,KLOR-CON) 20 MEQ tablet Take 1 tablet (20 mEq total) by  mouth daily.  30 tablet  11  . ranitidine (ZANTAC) 300 MG tablet Take 300 mg by mouth daily.        Marland Kitchen warfarin (COUMADIN) 10 MG tablet Take 1 tablet (10 mg total) by mouth as directed. As directed by Anticoagulation Clinic  30 tablet  11  . warfarin (COUMADIN) 5 MG tablet Take 1 tablet (5 mg total) by mouth as directed. As directed  30 tablet  11  . DISCONTD: digoxin (LANOXIN) 0.125 MG tablet Take 1 tablet (125 mcg total) by mouth daily.  30 tablet  11    Allergies  Quinine and Sulfadiazine  Electrocardiogram:  Afib rate 73  Dig effect  No acute changes     Assessment and Plan

## 2011-11-04 NOTE — Telephone Encounter (Signed)
Left mess for patient to call back.  

## 2011-11-05 ENCOUNTER — Telehealth: Payer: Self-pay | Admitting: Internal Medicine

## 2011-11-05 NOTE — Telephone Encounter (Signed)
See another tel message

## 2011-11-22 NOTE — Telephone Encounter (Signed)
Pt informed. She was seen by ENT and they didn't find anything and she does not have to go back.

## 2011-11-22 NOTE — Telephone Encounter (Signed)
Pt states she is taking Coumadin 10 mg 1 qd.

## 2011-11-22 NOTE — Telephone Encounter (Signed)
Needs to take 15 mg on Mon, Fri and 10 mg - the rest of the week (if no nose bleeds). INR in 2 wks Thx

## 2011-11-25 NOTE — Telephone Encounter (Signed)
Noted. Thx.

## 2011-11-29 ENCOUNTER — Telehealth: Payer: Self-pay | Admitting: *Deleted

## 2011-11-29 NOTE — Telephone Encounter (Signed)
Patient is overdue for her recall colonoscopy. Patient was on coumadin in 2010 when we saw her in the office and a lovenox bridge was suggested for colonoscopy. However patient could not afford it at that time. Dr Juanda Chance then [facility] patient to continue Coumadin for procedure since she did not have the funds for Lovenox with the understanding that we would not remove any large polyps while on coumadin. I have left a voicemail for patient to call back.

## 2011-12-02 ENCOUNTER — Ambulatory Visit (HOSPITAL_COMMUNITY): Payer: Medicare Other | Attending: Cardiovascular Disease | Admitting: Radiology

## 2011-12-02 DIAGNOSIS — I10 Essential (primary) hypertension: Secondary | ICD-10-CM | POA: Insufficient documentation

## 2011-12-02 DIAGNOSIS — I059 Rheumatic mitral valve disease, unspecified: Secondary | ICD-10-CM

## 2011-12-02 DIAGNOSIS — Z954 Presence of other heart-valve replacement: Secondary | ICD-10-CM | POA: Insufficient documentation

## 2011-12-02 DIAGNOSIS — Z6834 Body mass index (BMI) 34.0-34.9, adult: Secondary | ICD-10-CM | POA: Insufficient documentation

## 2011-12-02 DIAGNOSIS — I079 Rheumatic tricuspid valve disease, unspecified: Secondary | ICD-10-CM | POA: Insufficient documentation

## 2011-12-02 DIAGNOSIS — I4891 Unspecified atrial fibrillation: Secondary | ICD-10-CM | POA: Insufficient documentation

## 2011-12-02 DIAGNOSIS — Z9889 Other specified postprocedural states: Secondary | ICD-10-CM

## 2011-12-03 ENCOUNTER — Encounter (INDEPENDENT_AMBULATORY_CARE_PROVIDER_SITE_OTHER): Payer: Medicare Other | Admitting: *Deleted

## 2011-12-03 DIAGNOSIS — I6529 Occlusion and stenosis of unspecified carotid artery: Secondary | ICD-10-CM

## 2011-12-03 DIAGNOSIS — R0989 Other specified symptoms and signs involving the circulatory and respiratory systems: Secondary | ICD-10-CM

## 2011-12-05 ENCOUNTER — Other Ambulatory Visit: Payer: Self-pay | Admitting: *Deleted

## 2011-12-05 MED ORDER — WARFARIN SODIUM 5 MG PO TABS: 5.0000 mg | ORAL_TABLET | ORAL | Status: DC

## 2011-12-06 NOTE — Telephone Encounter (Signed)
I have spoken to the patient. She has scheduled an office visit with Dr Juanda Chance on 01/08/12 to again discuss recall colonoscopy. She states she was never told that she could have the procedure on coumadin. A note on 07/05/10 states that patient was advised but she may have forgotten.

## 2011-12-26 ENCOUNTER — Encounter: Payer: Self-pay | Admitting: *Deleted

## 2012-01-08 ENCOUNTER — Ambulatory Visit: Payer: Medicare Other | Admitting: Internal Medicine

## 2012-01-15 ENCOUNTER — Telehealth: Payer: Self-pay | Admitting: Internal Medicine

## 2012-01-15 ENCOUNTER — Other Ambulatory Visit: Payer: Self-pay | Admitting: *Deleted

## 2012-01-15 ENCOUNTER — Ambulatory Visit: Payer: Medicare Other

## 2012-01-15 ENCOUNTER — Ambulatory Visit: Payer: Medicare Other | Admitting: Internal Medicine

## 2012-01-15 DIAGNOSIS — E119 Type 2 diabetes mellitus without complications: Secondary | ICD-10-CM

## 2012-01-15 DIAGNOSIS — I10 Essential (primary) hypertension: Secondary | ICD-10-CM

## 2012-01-15 DIAGNOSIS — Z7901 Long term (current) use of anticoagulants: Secondary | ICD-10-CM

## 2012-01-15 DIAGNOSIS — Z0289 Encounter for other administrative examinations: Secondary | ICD-10-CM

## 2012-01-15 LAB — BASIC METABOLIC PANEL
BUN: 17 mg/dL (ref 6–23)
CO2: 29 mEq/L (ref 19–32)
Calcium: 9.2 mg/dL (ref 8.4–10.5)
Creatinine, Ser: 1.1 mg/dL (ref 0.4–1.2)

## 2012-01-15 LAB — PROTIME-INR: Prothrombin Time: 20.5 s — ABNORMAL HIGH (ref 10.2–12.4)

## 2012-01-15 LAB — TSH: TSH: 1.32 u[IU]/mL (ref 0.35–5.50)

## 2012-01-15 NOTE — Telephone Encounter (Signed)
How much coum is she taking? - low INR

## 2012-01-16 ENCOUNTER — Other Ambulatory Visit: Payer: Self-pay | Admitting: *Deleted

## 2012-01-16 MED ORDER — FUROSEMIDE 40 MG PO TABS: 40.0000 mg | ORAL_TABLET | Freq: Every day | ORAL | Status: DC

## 2012-01-16 MED ORDER — DILTIAZEM HCL ER COATED BEADS 240 MG PO CP24: 240.0000 mg | ORAL_CAPSULE | Freq: Every day | ORAL | Status: DC

## 2012-01-16 MED ORDER — POTASSIUM CHLORIDE CRYS ER 20 MEQ PO TBCR: 20.0000 meq | EXTENDED_RELEASE_TABLET | Freq: Every day | ORAL | Status: DC

## 2012-01-16 NOTE — Telephone Encounter (Signed)
Attempted to reach pt and received message that voice mailbox was full, unable to leave message.

## 2012-01-16 NOTE — Progress Notes (Signed)
Addended by: Merrilyn Puma on: 01/16/2012 08:43 AM   Modules accepted: Orders

## 2012-01-17 ENCOUNTER — Telehealth: Payer: Self-pay | Admitting: Internal Medicine

## 2012-01-17 NOTE — Telephone Encounter (Signed)
Message copied by Arna Snipe on Fri Jan 17, 2012  3:39 PM ------      Message from: Richardson Chiquito      Created: Thu Jan 09, 2012  8:02 AM                   ----- Message -----         From: Hart Carwin, MD         Sent: 01/08/2012   9:22 PM           To: Richardson Chiquito, CMA            Please charge no show fee.      ----- Message -----         From: Richardson Chiquito, CMA         Sent: 01/08/2012   5:17 PM           To: Hart Carwin, MD            Patient no showed appointment for 01/08/12. Would you like to charge no show fee?

## 2012-01-21 NOTE — Telephone Encounter (Signed)
Left mess for patient to call back.  

## 2012-01-22 NOTE — Telephone Encounter (Signed)
Pt has been 10.5 mg QD except Mon & Fridays she takes 12.5 mg.

## 2012-01-22 NOTE — Telephone Encounter (Signed)
Take Coum 12.5 mg qd INR in 3 wks Thx

## 2012-01-23 NOTE — Telephone Encounter (Signed)
Patient notified per MD.

## 2012-01-23 NOTE — Telephone Encounter (Signed)
No answer on home phone, message left on mobile and husband contacted and asked to have pt return call.

## 2012-02-07 ENCOUNTER — Telehealth: Payer: Self-pay | Admitting: Internal Medicine

## 2012-02-07 ENCOUNTER — Other Ambulatory Visit: Payer: Medicare Other

## 2012-02-07 DIAGNOSIS — Z7901 Long term (current) use of anticoagulants: Secondary | ICD-10-CM

## 2012-02-07 NOTE — Telephone Encounter (Signed)
Order entered. Pt aware.

## 2012-02-07 NOTE — Telephone Encounter (Signed)
The pt came into the office and is hoping to get a standing order for coumadin checks in the lab?  She was under the impression she already had these ordered.   Thanks!

## 2012-02-12 ENCOUNTER — Other Ambulatory Visit (INDEPENDENT_AMBULATORY_CARE_PROVIDER_SITE_OTHER): Payer: Medicare Other

## 2012-02-12 DIAGNOSIS — Z7901 Long term (current) use of anticoagulants: Secondary | ICD-10-CM

## 2012-02-12 LAB — PROTIME-INR: Prothrombin Time: 26.1 s — ABNORMAL HIGH (ref 10.2–12.4)

## 2012-02-13 ENCOUNTER — Telehealth: Payer: Self-pay | Admitting: Internal Medicine

## 2012-02-13 NOTE — Telephone Encounter (Signed)
Stacey , please, inform the patient: INR is OK. Take Coumadin as before. Recheck INR in 4 weeks. Thank you !  

## 2012-02-13 NOTE — Telephone Encounter (Signed)
Left mess for patient to call back.  

## 2012-02-16 ENCOUNTER — Telehealth: Payer: Self-pay | Admitting: Internal Medicine

## 2012-02-16 ENCOUNTER — Encounter (HOSPITAL_COMMUNITY): Payer: Self-pay

## 2012-02-16 ENCOUNTER — Emergency Department (HOSPITAL_COMMUNITY)
Admission: EM | Admit: 2012-02-16 | Discharge: 2012-02-16 | Disposition: A | Discharge status: Home / Self Care | Payer: Medicare Other | Attending: Emergency Medicine | Admitting: Emergency Medicine

## 2012-02-16 DIAGNOSIS — I4891 Unspecified atrial fibrillation: Secondary | ICD-10-CM | POA: Insufficient documentation

## 2012-02-16 DIAGNOSIS — R197 Diarrhea, unspecified: Secondary | ICD-10-CM | POA: Insufficient documentation

## 2012-02-16 DIAGNOSIS — R1110 Vomiting, unspecified: Secondary | ICD-10-CM | POA: Insufficient documentation

## 2012-02-16 DIAGNOSIS — Z954 Presence of other heart-valve replacement: Secondary | ICD-10-CM | POA: Insufficient documentation

## 2012-02-16 DIAGNOSIS — E119 Type 2 diabetes mellitus without complications: Secondary | ICD-10-CM | POA: Insufficient documentation

## 2012-02-16 DIAGNOSIS — K219 Gastro-esophageal reflux disease without esophagitis: Secondary | ICD-10-CM | POA: Insufficient documentation

## 2012-02-16 DIAGNOSIS — K529 Noninfective gastroenteritis and colitis, unspecified: Secondary | ICD-10-CM

## 2012-02-16 DIAGNOSIS — K5289 Other specified noninfective gastroenteritis and colitis: Secondary | ICD-10-CM | POA: Insufficient documentation

## 2012-02-16 DIAGNOSIS — I10 Essential (primary) hypertension: Secondary | ICD-10-CM | POA: Insufficient documentation

## 2012-02-16 DIAGNOSIS — R6883 Chills (without fever): Secondary | ICD-10-CM | POA: Insufficient documentation

## 2012-02-16 LAB — DIFFERENTIAL
Basophils Relative: 0 % (ref 0–1)
Eosinophils Absolute: 0 10*3/uL (ref 0.0–0.7)
Monocytes Absolute: 1.1 10*3/uL — ABNORMAL HIGH (ref 0.1–1.0)
Monocytes Relative: 14 % — ABNORMAL HIGH (ref 3–12)
Neutrophils Relative %: 67 % (ref 43–77)

## 2012-02-16 LAB — CBC
HCT: 36.4 % (ref 36.0–46.0)
Hemoglobin: 12.7 g/dL (ref 12.0–15.0)
MCH: 27.4 pg (ref 26.0–34.0)
MCHC: 34.9 g/dL (ref 30.0–36.0)

## 2012-02-16 LAB — COMPREHENSIVE METABOLIC PANEL
Albumin: 3.6 g/dL (ref 3.5–5.2)
BUN: 13 mg/dL (ref 6–23)
Creatinine, Ser: 1.05 mg/dL (ref 0.50–1.10)
Total Protein: 7.3 g/dL (ref 6.0–8.3)

## 2012-02-16 MED ORDER — KETOROLAC TROMETHAMINE 30 MG/ML IJ SOLN
30.0000 mg | Freq: Once | INTRAMUSCULAR | Status: AC
  Administered 2012-02-16: 30 mg via INTRAVENOUS
  Filled 2012-02-16: qty 1

## 2012-02-16 MED ORDER — PROMETHAZINE HCL 25 MG PO TABS: 25.0000 mg | ORAL_TABLET | Freq: Four times a day (QID) | ORAL | Status: DC | PRN

## 2012-02-16 MED ORDER — SODIUM CHLORIDE 0.9 % IV SOLN
Freq: Once | INTRAVENOUS | Status: AC
  Administered 2012-02-16: 09:00:00 via INTRAVENOUS

## 2012-02-16 MED ORDER — ONDANSETRON HCL 4 MG/2ML IJ SOLN
4.0000 mg | Freq: Once | INTRAMUSCULAR | Status: AC
  Administered 2012-02-16: 4 mg via INTRAVENOUS
  Filled 2012-02-16: qty 2

## 2012-02-16 NOTE — Telephone Encounter (Signed)
Stacey , please, inform the patient: INR is OK. Take Coumadin as before. Recheck INR in 4 weeks. Thank you !  

## 2012-02-16 NOTE — ED Provider Notes (Signed)
History     CSN: [id number]  Arrival date & time 02/16/12  9528   First MD Initiated Contact with Patient 02/16/12 0815      Chief Complaint  Patient presents with  . Emesis    (Consider location/radiation/quality/duration/timing/severity/associated sxs/prior treatment) Patient is a 71 y.o. female presenting with vomiting. The history is provided by the patient.  Emesis  This is a new problem. The current episode started yesterday. The problem occurs continuously. The problem has been gradually worsening. The emesis has an appearance of stomach contents. There has been no fever. Associated symptoms include chills and diarrhea. Pertinent negatives include no abdominal pain and no fever. Risk factors: none.    Past Medical History  Diagnosis Date  . Hypertension   . Diabetes mellitus   . GERD (gastroesophageal reflux disease)   . AF (atrial fibrillation)   . Chronic anticoagulation   . Chest pain   . Hematoma   . Diverticulosis   . Paresthesia   . Cataract, senile   . Disorder of oral soft tissue     of mouth  . Leg pain   . Sweating   . ALLERGIC RHINITIS   . Low back pain   . Diabetes mellitus   . Status post mitral valve replacement     St. Jude valve  . Vitamin d deficiency     Past Surgical History  Procedure Date  . Abdominal hysterectomy   . Cholecystectomy   . Cardiac valve replacement 1995    Mitral valve prosthesis; st jude    Family History  Problem Relation Age of Onset  . Hypertension    . Diabetes Mother   . Seizures Mother     History of brain tumor  . Diabetes Brother   . Heart disease Brother   . Pancreatic cancer Brother   . Colon cancer Neg Hx     History  Substance Use Topics  . Smoking status: Former Smoker    Quit date: 10/28/2004  . Smokeless tobacco: Not on file  . Alcohol Use: No    OB History    Grav Para Term Preterm Abortions TAB SAB Ect Mult Living                  Review of Systems  Constitutional: Positive for  chills. Negative for fever.  Gastrointestinal: Positive for vomiting and diarrhea. Negative for abdominal pain.  All other systems reviewed and are negative.    Allergies  Food; Quinine; and Sulfadiazine  Home Medications   Current Outpatient Rx  Name Route Sig Dispense Refill  . AMOXICILLIN 500 MG PO TABS  Take four tablets one hour prior to dental procedure 4 tablet 6  . VITAMIN D3 1000 UNITS PO TABS Oral Take 1,000 Units by mouth daily.      Marland Kitchen DICLOFENAC SODIUM 1.5 % TD SOLN Transdermal Place 3-5 drops onto the skin 3 (three) times daily. For pain     . DIGOXIN 0.25 MG PO TABS Oral Take 1 tablet (250 mcg total) by mouth daily. 30 tablet 11  . DILTIAZEM HCL ER COATED BEADS 240 MG PO CP24 Oral Take 1 capsule (240 mg total) by mouth daily. 30 capsule 5  . FUROSEMIDE 40 MG PO TABS Oral Take 1 tablet (40 mg total) by mouth daily. 30 tablet 5  . GLUCOSAMINE-CHONDROITIN 500-400 MG PO TABS Oral Take 1 tablet by mouth daily.      Marland Kitchen LORATADINE 10 MG PO TABS Oral Take 10 mg by mouth  daily. As needed for allergies     . FISH OIL 1000 MG PO CAPS Oral Take 1 capsule by mouth daily.      Marland Kitchen OMEPRAZOLE 10 MG PO CPDR Oral Take 1 capsule (10 mg total) by mouth daily. 30 capsule 1  . POTASSIUM CHLORIDE CRYS ER 20 MEQ PO TBCR Oral Take 1 tablet (20 mEq total) by mouth daily. 30 tablet 5  . RANITIDINE HCL 300 MG PO TABS Oral Take 300 mg by mouth daily.     . WARFARIN SODIUM 10 MG PO TABS Oral Take 1 tablet (10 mg total) by mouth as directed. As directed by Anticoagulation Clinic 30 tablet 11  . WARFARIN SODIUM 2.5 MG PO TABS Oral Take 2.5 mg by mouth 2 (two) times a week. Takes on Monday and Friday , (along with 10mg  tab)    . ACCU-CHEK AVIVA VI STRP   TEST ONCE DAILY 100 each 3  . METAXALONE 800 MG PO TABS Oral Take 800 mg by mouth 3 (three) times daily.       BP 122/59  Pulse 114  Temp(Src) 98.5 F (36.9 C) (Oral)  Resp 18  SpO2 96%  Physical Exam  Nursing note and vitals  reviewed. Constitutional: She is oriented to person, place, and time. She appears well-developed and well-nourished. No distress.  HENT:  Head: Normocephalic and atraumatic.  Mouth/Throat: Oropharynx is clear and moist.  Neck: Normal range of motion. Neck supple.  Cardiovascular: Normal rate and regular rhythm.   No murmur heard. Pulmonary/Chest: Effort normal and breath sounds normal. No respiratory distress.  Abdominal: Soft. Bowel sounds are normal. She exhibits no distension. There is no tenderness. There is no rebound.  Musculoskeletal: Normal range of motion. She exhibits no edema.  Neurological: She is alert and oriented to person, place, and time.  Skin: Skin is warm and dry. She is not diaphoretic.    ED Course  Procedures (including critical care time)   Labs Reviewed  CBC  DIFFERENTIAL  COMPREHENSIVE METABOLIC PANEL   No results found.   No diagnosis found.    MDM  The labs, presentation, and exam are consistent with viral gastroenteritis.  She is feeling better with fluids and meds.  Will discharge to home, to return or follow up prn.        Geoffery Lyons, MD 02/16/12 1008

## 2012-02-16 NOTE — ED Notes (Signed)
Pt report vomiting and diarrhea started last night at 7pm, constant watery diarrhea, vomited approx 10 times

## 2012-02-16 NOTE — Discharge Instructions (Signed)
Viral Gastroenteritis Viral gastroenteritis is also known as stomach flu. This condition affects the stomach and intestinal tract. It can cause sudden diarrhea and vomiting. The illness typically lasts 3 to 8 days. Most people develop an immune response that eventually gets rid of the virus. While this natural response develops, the virus can make you quite ill. CAUSES  Many different viruses can cause gastroenteritis, such as rotavirus or noroviruses. You can catch one of these viruses by consuming contaminated food or water. You may also catch a virus by sharing utensils or other personal items with an infected person or by touching a contaminated surface. SYMPTOMS  The most common symptoms are diarrhea and vomiting. These problems can cause a severe loss of body fluids (dehydration) and a body salt (electrolyte) imbalance. Other symptoms may include:  Fever.   Headache.   Fatigue.   Abdominal pain.  DIAGNOSIS  Your caregiver can usually diagnose viral gastroenteritis based on your symptoms and a physical exam. A stool sample may also be taken to test for the presence of viruses or other infections. TREATMENT  This illness typically goes away on its own. Treatments are aimed at rehydration. The most serious cases of viral gastroenteritis involve vomiting so severely that you are not able to keep fluids down. In these cases, fluids must be given through an intravenous line (IV). HOME CARE INSTRUCTIONS   Drink enough fluids to keep your urine clear or pale yellow. Drink small amounts of fluids frequently and increase the amounts as tolerated.   Ask your caregiver for specific rehydration instructions.   Avoid:   Foods high in sugar.   Alcohol.   Carbonated drinks.   Tobacco.   Juice.   Caffeine drinks.   Extremely hot or cold fluids.   Fatty, greasy foods.   Too much intake of anything at one time.   Dairy products until 24 to 48 hours after diarrhea stops.   You may  consume probiotics. Probiotics are active cultures of beneficial bacteria. They may lessen the amount and number of diarrheal stools in adults. Probiotics can be found in yogurt with active cultures and in supplements.   Wash your hands well to avoid spreading the virus.   Only take over-the-counter or prescription medicines for pain, discomfort, or fever as directed by your caregiver. Do not give aspirin to children. Antidiarrheal medicines are not recommended.   Ask your caregiver if you should continue to take your regular prescribed and over-the-counter medicines.   Keep all follow-up appointments as directed by your caregiver.  SEEK IMMEDIATE MEDICAL CARE IF:   You are unable to keep fluids down.   You do not urinate at least once every 6 to 8 hours.   You develop shortness of breath.   You notice blood in your stool or vomit. This may look like coffee grounds.   You have abdominal pain that increases or is concentrated in one small area (localized).   You have persistent vomiting or diarrhea.   You have a fever.   The patient is a child younger than 3 months, and he or she has a fever.   The patient is a child older than 3 months, and he or she has a fever and persistent symptoms.   The patient is a child older than 3 months, and he or she has a fever and symptoms suddenly get worse.   The patient is a baby, and he or she has no tears when crying.  MAKE SURE YOU:     Understand these instructions.   Will watch your condition.   Will get help right away if you are not doing well or get worse.  Document Released: 10/14/2005 Document Revised: 10/03/2011 Document Reviewed: 07/31/2011 Exit[employer] Patient Information 2012 [employer]. 

## 2012-02-19 NOTE — Telephone Encounter (Signed)
Pt informed

## 2012-02-28 ENCOUNTER — Other Ambulatory Visit: Payer: Self-pay | Admitting: *Deleted

## 2012-02-28 MED ORDER — WARFARIN SODIUM 10 MG PO TABS: 10.0000 mg | ORAL_TABLET | ORAL | Status: DC

## 2012-03-05 ENCOUNTER — Other Ambulatory Visit (INDEPENDENT_AMBULATORY_CARE_PROVIDER_SITE_OTHER): Payer: Medicare Other

## 2012-03-05 DIAGNOSIS — Z7901 Long term (current) use of anticoagulants: Secondary | ICD-10-CM

## 2012-03-05 LAB — PROTIME-INR
INR: 2.7 ratio — ABNORMAL HIGH (ref 0.8–1.0)
Prothrombin Time: 29.4 s — ABNORMAL HIGH (ref 10.2–12.4)

## 2012-03-06 ENCOUNTER — Telehealth: Payer: Self-pay | Admitting: Internal Medicine

## 2012-03-06 NOTE — Telephone Encounter (Signed)
Left detailed mess informing pt of below.  

## 2012-03-06 NOTE — Telephone Encounter (Signed)
Stacey , please, inform the patient: INR is OK. Take Coumadin as before. Recheck INR in 4 weeks. Thank you !  

## 2012-04-17 ENCOUNTER — Other Ambulatory Visit: Payer: Self-pay | Admitting: Internal Medicine

## 2012-05-05 ENCOUNTER — Other Ambulatory Visit (INDEPENDENT_AMBULATORY_CARE_PROVIDER_SITE_OTHER): Payer: Medicare Other

## 2012-05-05 DIAGNOSIS — Z7901 Long term (current) use of anticoagulants: Secondary | ICD-10-CM

## 2012-05-05 LAB — PROTIME-INR: INR: 2.2 ratio — ABNORMAL HIGH (ref 0.8–1.0)

## 2012-05-06 ENCOUNTER — Telehealth: Payer: Self-pay | Admitting: Internal Medicine

## 2012-05-06 NOTE — Telephone Encounter (Signed)
Beth Miller , please, inform the patient: INR is OK. Take Coumadin as before. Recheck INR in 4 weeks. Thank you !  

## 2012-05-06 NOTE — Telephone Encounter (Signed)
Pt informed

## 2012-05-19 ENCOUNTER — Ambulatory Visit: Payer: Medicare Other | Admitting: Internal Medicine

## 2012-05-27 ENCOUNTER — Ambulatory Visit
Admission: RE | Admit: 2012-05-27 | Discharge: 2012-05-27 | Disposition: A | Discharge status: Home / Self Care | Payer: Medicare Other | Source: Ambulatory Visit | Attending: Internal Medicine | Admitting: Internal Medicine

## 2012-05-27 ENCOUNTER — Other Ambulatory Visit: Payer: Self-pay | Admitting: Internal Medicine

## 2012-05-27 DIAGNOSIS — Z1231 Encounter for screening mammogram for malignant neoplasm of breast: Secondary | ICD-10-CM

## 2012-05-29 ENCOUNTER — Ambulatory Visit (INDEPENDENT_AMBULATORY_CARE_PROVIDER_SITE_OTHER): Payer: Medicare Other | Admitting: Internal Medicine

## 2012-05-29 ENCOUNTER — Encounter: Payer: Self-pay | Admitting: Internal Medicine

## 2012-05-29 VITALS — BP 100/60 | HR 68 | Temp 97.4°F | Resp 16 | Ht 62.0 in | Wt 177.0 lb

## 2012-05-29 DIAGNOSIS — Z0000 Encounter for general adult medical examination without abnormal findings: Secondary | ICD-10-CM

## 2012-05-29 DIAGNOSIS — I4891 Unspecified atrial fibrillation: Secondary | ICD-10-CM

## 2012-05-29 DIAGNOSIS — E785 Hyperlipidemia, unspecified: Secondary | ICD-10-CM

## 2012-05-29 DIAGNOSIS — E119 Type 2 diabetes mellitus without complications: Secondary | ICD-10-CM

## 2012-05-29 MED ORDER — POTASSIUM CHLORIDE CRYS ER 20 MEQ PO TBCR: 20.0000 meq | EXTENDED_RELEASE_TABLET | Freq: Every day | ORAL | Status: DC

## 2012-05-29 MED ORDER — DILTIAZEM HCL ER COATED BEADS 240 MG PO CP24: 240.0000 mg | ORAL_CAPSULE | Freq: Every day | ORAL | Status: DC

## 2012-05-29 MED ORDER — WARFARIN SODIUM 5 MG PO TABS: 2.5000 mg | ORAL_TABLET | ORAL | Status: DC

## 2012-05-29 MED ORDER — WARFARIN SODIUM 10 MG PO TABS: 10.0000 mg | ORAL_TABLET | ORAL | Status: DC

## 2012-05-29 MED ORDER — DIGOXIN 250 MCG PO TABS: 250.0000 ug | ORAL_TABLET | Freq: Every day | ORAL | Status: DC

## 2012-05-29 MED ORDER — FUROSEMIDE 40 MG PO TABS: 40.0000 mg | ORAL_TABLET | Freq: Every day | ORAL | Status: DC

## 2012-05-29 NOTE — Assessment & Plan Note (Addendum)
The patient is here for annual Medicare wellness examination and management of other chronic and acute problems.   The risk factors are reflected in the social history.  The roster of all physicians providing medical care to patient - is listed in the Snapshot section of the chart.  Activities of daily living:  The patient is 100% inedpendent in all ADLs: dressing, toileting, feeding as well as independent mobility  Home safety : The patient has smoke detectors in the home. They wear seatbelts.No firearms at home ( firearms are present in the home, kept in a safe fashion). There is no violence in the home.   There is no risks for hepatitis, STDs or HIV. There is no   history of blood transfusion. They have no travel history to infectious disease endemic areas of the world.  The patient has (has not) seen their dentist in the last six month. They have (not) seen their eye doctor in the last year. They deny (admit to) any hearing difficulty and have not had audiologic testing in the last year.  They do not  have excessive sun exposure. Discussed the need for sun protection: hats, long sleeves and use of sunscreen if there is significant sun exposure.   Diet: the importance of a healthy diet is discussed. They do have a healthy (unhealthy-high fat/fast food) diet.  The patient has a regular exercise program: ____bowling___ , ____duration, ___2__per week.  The benefits of regular aerobic exercise were discussed.  Depression screen: there are no signs or vegative symptoms of depression- irritability, change in appetite, anhedonia, sadness/tearfullness.  Cognitive assessment: the patient manages all their financial and personal affairs and is actively engaged. They could relate day,date,year and events; recalled 3/3 objects at 3 minutes; performed clock-face test normally.  The following portions of the patient's history were reviewed and updated as appropriate: allergies, current medications, past  family history, past medical history,  past surgical history, past social history  and problem list.  Vision, hearing, body mass index were assessed and reviewed.   During the course of the visit the patient was educated and counseled about appropriate screening and preventive services including : fall prevention , diabetes screening, nutrition counseling, colorectal cancer screening, and recommended immunizations.  Mammo, Ophth, PAP q 12 mo DT 2005 Colonoscopy, zostavax adviced

## 2012-05-29 NOTE — Assessment & Plan Note (Signed)
Labs  Continue with current prescription therapy as reflected on the Med list.  

## 2012-05-29 NOTE — Progress Notes (Signed)
Patient ID: Beth Miller, female   DOB: 01-24-1941, 71 y.o.   MRN: [id number]  Subjective:    Patient ID: Beth Miller, female    DOB: [birth year], 71 y.o.   MRN: [id number]  HPI The patient is here for a wellness exam. The patient has been doing well overall without major physical or psychological issues going on lately.  The patient presents for a follow-up of  chronic hypertension, chronic dyslipidemia, anticoagulation, controlled with medicines. F/u OA, R knee pain  Wt Readings from Last 3 Encounters:  05/29/12 177 lb (80.287 kg)  11/04/11 188 lb (85.276 kg)  09/12/11 188 lb (85.276 kg)   BP Readings from Last 3 Encounters:  05/29/12 100/60  02/16/12 122/59  11/04/11 120/56     Review of Systems  Constitutional: Negative for chills, activity change, appetite change, fatigue and unexpected weight change.  HENT: Negative for congestion, mouth sores and sinus pressure.   Eyes: Negative for visual disturbance.  Respiratory: Negative for cough and chest tightness.   Gastrointestinal: Negative for nausea and abdominal pain.  Genitourinary: Negative for frequency, difficulty urinating and vaginal pain.  Musculoskeletal: Positive for arthralgias (R knee). Negative for back pain and gait problem.  Skin: Negative for pallor and rash.  Neurological: Negative for dizziness, tremors, weakness, numbness and headaches.  Psychiatric/Behavioral: Negative for confusion and disturbed wake/sleep cycle.       Objective:   Physical Exam  Constitutional: She appears well-developed. No distress.       obese  HENT:  Head: Normocephalic.  Right Ear: External ear normal.  Left Ear: External ear normal.  Nose: Nose normal.  Mouth/Throat: Oropharynx is clear and moist.  Eyes: Conjunctivae are normal. Pupils are equal, round, and reactive to light. Right eye exhibits no discharge. Left eye exhibits no discharge.  Neck: Normal range of motion. Neck supple. No JVD present. No tracheal  deviation present. No thyromegaly present.  Cardiovascular: Normal rate and normal heart sounds.        Irregular  Pulmonary/Chest: No stridor. No respiratory distress. She has no wheezes.  Abdominal: Soft. Bowel sounds are normal. She exhibits no distension and no mass. There is no tenderness. There is no rebound and no guarding.  Musculoskeletal: She exhibits tenderness (R knee w/mild tenderness). She exhibits no edema.  Lymphadenopathy:    She has no cervical adenopathy.  Neurological: She displays normal reflexes. No cranial nerve deficit. She exhibits normal muscle tone. Coordination normal.  Skin: No rash noted. No erythema.  Psychiatric: She has a normal mood and affect. Her behavior is normal. Judgment and thought content normal.   Lab Results  Component Value Date   WBC 7.4 02/16/2012   HGB 12.7 02/16/2012   HCT 36.4 02/16/2012   PLT 221 02/16/2012   GLUCOSE 116* 02/16/2012   CHOL 135 11/01/2011   TRIG 85.0 11/01/2011   HDL 48.60 11/01/2011   LDLCALC 69 11/01/2011   ALT 21 02/16/2012   AST 27 02/16/2012   NA 138 02/16/2012   K 3.4* 02/16/2012   CL 100 02/16/2012   CREATININE 1.05 02/16/2012   BUN 13 02/16/2012   CO2 28 02/16/2012   TSH 1.32 01/15/2012   INR 2.2* 05/05/2012   HGBA1C 6.3 01/15/2012          Assessment & Plan:

## 2012-06-01 ENCOUNTER — Other Ambulatory Visit: Payer: Self-pay | Admitting: Internal Medicine

## 2012-06-01 ENCOUNTER — Other Ambulatory Visit (INDEPENDENT_AMBULATORY_CARE_PROVIDER_SITE_OTHER): Payer: Medicare Other

## 2012-06-01 DIAGNOSIS — E119 Type 2 diabetes mellitus without complications: Secondary | ICD-10-CM

## 2012-06-01 DIAGNOSIS — E785 Hyperlipidemia, unspecified: Secondary | ICD-10-CM

## 2012-06-01 DIAGNOSIS — Z0000 Encounter for general adult medical examination without abnormal findings: Secondary | ICD-10-CM

## 2012-06-01 DIAGNOSIS — R928 Other abnormal and inconclusive findings on diagnostic imaging of breast: Secondary | ICD-10-CM

## 2012-06-01 DIAGNOSIS — I4891 Unspecified atrial fibrillation: Secondary | ICD-10-CM

## 2012-06-01 DIAGNOSIS — Z7901 Long term (current) use of anticoagulants: Secondary | ICD-10-CM

## 2012-06-01 LAB — HEPATIC FUNCTION PANEL
ALT: 16 U/L (ref 0–35)
AST: 25 U/L (ref 0–37)
Albumin: 4.1 g/dL (ref 3.5–5.2)
Total Protein: 7.3 g/dL (ref 6.0–8.3)

## 2012-06-01 LAB — CBC WITH DIFFERENTIAL/PLATELET
Basophils Absolute: 0.1 10*3/uL (ref 0.0–0.1)
Hemoglobin: 12.7 g/dL (ref 12.0–15.0)
Lymphocytes Relative: 34.8 % (ref 12.0–46.0)
Monocytes Relative: 13.3 % — ABNORMAL HIGH (ref 3.0–12.0)
Neutro Abs: 2.4 10*3/uL (ref 1.4–7.7)
Neutrophils Relative %: 49.3 % (ref 43.0–77.0)
RBC: 4.63 Mil/uL (ref 3.87–5.11)
RDW: 15.8 % — ABNORMAL HIGH (ref 11.5–14.6)

## 2012-06-01 LAB — BASIC METABOLIC PANEL
Calcium: 9.4 mg/dL (ref 8.4–10.5)
GFR: 80 mL/min (ref >60.00)
Glucose, Bld: 110 mg/dL — ABNORMAL HIGH (ref 70–99)
Sodium: 140 mEq/L (ref 135–145)

## 2012-06-01 LAB — LIPID PANEL
Cholesterol: 123 mg/dL (ref 0–200)
HDL: 45.1 mg/dL (ref >39.00)
Total CHOL/HDL Ratio: 3
Triglycerides: 121 mg/dL (ref 0.0–149.0)

## 2012-06-01 LAB — URINALYSIS, ROUTINE W REFLEX MICROSCOPIC
Bilirubin Urine: NEGATIVE
Ketones, ur: NEGATIVE
Total Protein, Urine: NEGATIVE
Urine Glucose: NEGATIVE

## 2012-06-01 LAB — TSH: TSH: 1.44 u[IU]/mL (ref 0.35–5.50)

## 2012-06-02 ENCOUNTER — Telehealth: Payer: Self-pay | Admitting: Internal Medicine

## 2012-06-02 NOTE — Telephone Encounter (Signed)
Take Coumadin 10 mg on Wed and Fri; 12.5 mg - all other days INR in 2 wks Thx

## 2012-06-02 NOTE — Telephone Encounter (Signed)
Beth Miller, please, inform patient that all labs are normal except for INR 4.7 What is the coum dose? Thx

## 2012-06-02 NOTE — Telephone Encounter (Signed)
Pt informed. She has been taking 12.5 mg qd.

## 2012-06-03 NOTE — Telephone Encounter (Signed)
Pt informed

## 2012-06-08 ENCOUNTER — Ambulatory Visit
Admission: RE | Admit: 2012-06-08 | Discharge: 2012-06-08 | Disposition: A | Discharge status: Home / Self Care | Payer: Medicare Other | Source: Ambulatory Visit | Attending: Internal Medicine | Admitting: Internal Medicine

## 2012-06-08 DIAGNOSIS — R928 Other abnormal and inconclusive findings on diagnostic imaging of breast: Secondary | ICD-10-CM

## 2012-07-09 ENCOUNTER — Other Ambulatory Visit (INDEPENDENT_AMBULATORY_CARE_PROVIDER_SITE_OTHER): Payer: Medicare Other

## 2012-07-09 DIAGNOSIS — Z7901 Long term (current) use of anticoagulants: Secondary | ICD-10-CM

## 2012-07-09 LAB — PROTIME-INR: Prothrombin Time: 25.6 s — ABNORMAL HIGH (ref 10.2–12.4)

## 2012-07-10 ENCOUNTER — Telehealth: Payer: Self-pay | Admitting: Internal Medicine

## 2012-07-10 NOTE — Telephone Encounter (Signed)
Beth Miller , please, inform the patient: INR is OK. Take Coumadin as before. Recheck INR in 4 weeks. Pls ref to [facility] Thank you !

## 2012-07-13 NOTE — Telephone Encounter (Signed)
Pt informed/transferred to [place], coumadin clinic nurse.

## 2012-07-16 ENCOUNTER — Encounter: Payer: Self-pay | Admitting: *Deleted

## 2012-07-16 ENCOUNTER — Telehealth: Payer: Self-pay | Admitting: Internal Medicine

## 2012-07-16 NOTE — Telephone Encounter (Signed)
Left a voice mail for patient to call me.

## 2012-07-16 NOTE — Telephone Encounter (Signed)
Spoke with patient and she reports diarrhea and nausea x 1 week. States last Thursday, she started having bowel movements that were like water. She is having only one episode of diarrhea/day but reports that stool is light brown or clear not normal in color. States her stomach feels"sick all the time." Denies vomiting just nausea and feeling weak. No appetite at all.  Denies bleeding, constipation, recent antibiotic or new medication. States she has not taken anything for the nausea or diarrhea. She reports she is overdue for colonoscopy because she could not afford the medication to take instead of her Coumadin. Scheduled patient on 07/17/12 at 2;45 PM with Dr. Juanda Chance.

## 2012-07-16 NOTE — Telephone Encounter (Signed)
I will see her 

## 2012-07-17 ENCOUNTER — Encounter: Payer: Self-pay | Admitting: Internal Medicine

## 2012-07-17 ENCOUNTER — Telehealth: Payer: Self-pay

## 2012-07-17 ENCOUNTER — Ambulatory Visit (INDEPENDENT_AMBULATORY_CARE_PROVIDER_SITE_OTHER): Payer: Medicare Other | Admitting: Internal Medicine

## 2012-07-17 VITALS — BP 104/60 | HR 72 | Ht 62.0 in | Wt 177.6 lb

## 2012-07-17 DIAGNOSIS — R197 Diarrhea, unspecified: Secondary | ICD-10-CM

## 2012-07-17 DIAGNOSIS — R634 Abnormal weight loss: Secondary | ICD-10-CM

## 2012-07-17 MED ORDER — PEG-KCL-NACL-NASULF-NA ASC-C 100 G PO SOLR: 1.0000 | Freq: Once | ORAL | Status: DC

## 2012-07-17 MED ORDER — ALIGN 4 MG PO CAPS: 1.0000 | ORAL_CAPSULE | Freq: Every day | ORAL | Status: DC

## 2012-07-17 MED ORDER — METRONIDAZOLE 250 MG PO TABS: 250.0000 mg | ORAL_TABLET | Freq: Three times a day (TID) | ORAL | Status: AC

## 2012-07-17 NOTE — Telephone Encounter (Signed)
  07/17/2012    RE: Beth Miller DOB: 12/02/1940 MRN: [id number]   Dear Dr. Posey Rea and Dr. [name].   We have scheduled the above patient for an endoscopic procedure. Our records show that she is on anticoagulation therapy.   Please advise as to how long the patient may come off her therapy of coumadin prior to the procedure, which is scheduled for 09/09/12.  Please fax back/ or route the completed form to Beth Miller at [phone].   Sincerely,  Beth Miller, CMA  Patient knows she probable will not be able to come off Coumadin for her Colonoscopy and since she has tried in the past. Patient cannot afford the Lovenox and needs to be set up with an patient assistance program.

## 2012-07-17 NOTE — Patient Instructions (Addendum)
You have been scheduled for a colonoscopy with propofol. Please follow written instructions given to you at your visit today.  Please pick up your prep kit at the pharmacy within the next 1-3 days. If you use inhalers (even only as needed), please bring them with you on the day of your procedure.  Your physician has requested that you go to the basement for the following lab work before leaving today: Stool Cultures.  We have sent the following medications to your pharmacy for you to pick up at your convenience: Flagyl.  We have given you samples of Align. This puts good bacteria back into your colon. You should take 1 capsule by mouth once daily. If this works well for you, it can be purchased over the counter.    Dr [name],, Dr Posey Rea

## 2012-07-17 NOTE — Progress Notes (Signed)
Beth Miller [birth year] MRN [id number]        History of Present Illness:  This is a 71 year old African American female with acute diarrheal illness which started one week ago after she ate four prunes. The diarrhea has persisted until today when she already had 2 loose bowel movements. She has diffuse abdominal soreness but no pain. She had a similar episode one month ago associated with nausea vomiting and diarrhea and was evaluated in the emergency room and hydrated. She has a history of mitral valve replacement in 1995 for rheumatic heart disease, with St Jude's valve, and history of atrial fibrillation for which she takes long term Coumadin. Dr [name] is her cardiologist and he recommends thatshe never goes off Coumadin without going on Lovenox first. Last colonoscopy was done in July 2001 by Dr. Randa Evens but we don't have any record. She has been due for recall colonoscopy but could not afford Lovenox. Last echocardiogram in December 2012 showed normal left ventricular function and normal functioning prosthetic valve. There is no family history of colon cancer. The stool is watery but there is no blood   Past Medical History  Diagnosis Date  . Hypertension   . Diabetes mellitus   . GERD (gastroesophageal reflux disease)   . AF (atrial fibrillation)   . Chronic anticoagulation   . Chest pain   . Hematoma   . Diverticulosis   . Paresthesia   . Cataract, senile   . Disorder of oral soft tissue     of mouth  . Leg pain   . Sweating   . ALLERGIC RHINITIS   . Low back pain   . Diabetes mellitus   . Status post mitral valve replacement     St. Jude valve  . Vitamin d deficiency    Past Surgical History  Procedure Date  . Abdominal hysterectomy   . Cholecystectomy   . Cardiac valve replacement 1995    Mitral valve prosthesis; st jude    reports that she quit smoking about 7 years ago. She has never used smokeless tobacco. She reports that she does not drink alcohol or use  illicit drugs. family history includes Diabetes in her brother and mother; Heart disease in her brother; Hypertension in an unspecified family member; Other in her mother; Pancreatic cancer in her brother; and Seizures in her mother.  There is no history of Colon cancer. Allergies  Allergen Reactions  . Food     Optometrist  . Quinine   . Sulfadiazine         Review of Systems: Decreased appetite. Intentional weight loss of about 8 pounds. No fever  The remainder of the 10 point ROS is negative except as outlined in H&P   Physical Exam: General appearance  Well developed, in no distress. Mildly overweight alert and oriented Eyes- non icteric. HEENT nontraumatic, normocephalic. Mouth no lesions, tongue papillated, no cheilosis. Neck supple without adenopathy, thyroid not enlarged, , no JVD. Lungs Clear to auscultation bilaterally. Cor normal S1, normal S2, prosthetic sounds, no murmur,  quiet precordium., Post thoracotomy scar Abdomen: Soft with minimal tenderness in right lower quadrant and periumbilical area. No palpable mass. Normoactive bowel sounds Rectal: Soft Hemoccult negative stool Extremities no pedal edema. Skin no lesions. Neurological alert and oriented x 3. Psychological normal mood and affect.  Assessment and Plan:  Acute diarrhea illness which started after taking a laxative. Possible explanation for persistent diarrhea would be infectious cause bacteria overgrowth or nonspecific colitis. There is  no occult blood, she had similar episode one month ago. She is due for colonoscopy. I would treat her empirically with Flagyl 250 mg 3 times a day for week and also add samples of probiotic. Obtain stool studies for culture, lactoferrin and C. difficile toxin. We will try to arrange colonoscopy for her off Coumadin with Lovenox a bridge providing she can get her Lovenox a tno  Cost, we will see if we can arrange that for her   07/17/2012 Lina Sar

## 2012-07-19 NOTE — Telephone Encounter (Signed)
Needs lovenox bridge for stopping coumadin with afib and mechanical MVR

## 2012-07-20 ENCOUNTER — Telehealth: Payer: Self-pay | Admitting: *Deleted

## 2012-07-20 ENCOUNTER — Encounter: Payer: Self-pay | Admitting: Gastroenterology

## 2012-07-20 NOTE — Telephone Encounter (Signed)
Please ref to Arline Asp [facility]) for bridging and further care Thx

## 2012-07-20 NOTE — Telephone Encounter (Signed)
-----   Message -----    From: Jessee Avers, CMA    Sent: 07/17/2012   3:25 PM      To: Richardson Chiquito, CMA  This patient I scheduled for a Colonoscopy off coumadin with Lovenox bridge. She is a patient of Dr. [name] and Dr. Juanda Chance told me to contact the coumadin clinic regarding a patient assistance program for the Lovenox. She has had to cancel her Colonoscopy in the past due to the cost of Lovenox. I called them and they do not manage her coumadin since may of last year but the nurse was kind enough to fax me a patient assistance form. I called Dr. Loren Racer office and his CMA was not able to give much assistance regarding lovenox. I told her that I am sending a anticoagulant telephone to Dr. Posey Rea anyway so he can still decide. I also sent it to Dr. [name] (her cardiologist). I tried to fill out the assistance forms but we cannot since we are not prescribing Lovenox. I already told the patient that we would get in touch with her next week if we can find assistance information for her. Will you please call her and tell her that Dr. Posey Rea has to fill out the forms but we can give her the forms to take to there office. If any questions please ask Patty.

## 2012-07-20 NOTE — Telephone Encounter (Signed)
Dr Posey Rea, as prescribing MD, would you assist in Lovenox bridge for patient? Thanks

## 2012-07-21 ENCOUNTER — Ambulatory Visit (INDEPENDENT_AMBULATORY_CARE_PROVIDER_SITE_OTHER): Payer: Medicare Other | Admitting: General Practice

## 2012-07-21 ENCOUNTER — Telehealth: Payer: Self-pay | Admitting: General Practice

## 2012-07-21 DIAGNOSIS — I4891 Unspecified atrial fibrillation: Secondary | ICD-10-CM

## 2012-07-21 DIAGNOSIS — Z9889 Other specified postprocedural states: Secondary | ICD-10-CM

## 2012-07-21 DIAGNOSIS — Z7901 Long term (current) use of anticoagulants: Secondary | ICD-10-CM

## 2012-07-21 LAB — POCT INR: INR: 4.4

## 2012-07-21 NOTE — Telephone Encounter (Signed)
Left msg for pt to return call to coumadin clinic.  Pt needs to come in and have INR checked.  Last INR 2.3 on 9/12.

## 2012-07-21 NOTE — Telephone Encounter (Signed)
Arline Asp, could you assist in Lovenox bridge for patient??? She will need patient assistance to afford Lovenox. We have the forms that she will pick up for prescribing MD to fill out. Thanks!

## 2012-07-21 NOTE — Telephone Encounter (Signed)
Left voicemail for patient to call back. 

## 2012-07-22 ENCOUNTER — Other Ambulatory Visit: Payer: Medicare Other

## 2012-07-22 NOTE — Telephone Encounter (Signed)
Left message for patient to call back  

## 2012-07-22 NOTE — Telephone Encounter (Signed)
Yes, thank you.  I will assist with the Lovenox bridge.

## 2012-07-23 NOTE — Telephone Encounter (Signed)
I have spoken to patient and advised that she does need to pick up Lovenox patient assistance papers from our office to give to the coumadin clinic either later this week or early next week. Patient verbalizes understanding.

## 2012-07-23 NOTE — Telephone Encounter (Signed)
Patient states that she had INR on 07/22/12.... Not sure if you were aware.... Results are in EPIC. I have advised patient to pick up forms from [place] that you guys will fill out. Thanks for your help!

## 2012-08-03 ENCOUNTER — Telehealth: Payer: Self-pay | Admitting: *Deleted

## 2012-08-03 ENCOUNTER — Ambulatory Visit (INDEPENDENT_AMBULATORY_CARE_PROVIDER_SITE_OTHER): Payer: Medicare Other | Admitting: General Practice

## 2012-08-03 DIAGNOSIS — Z7901 Long term (current) use of anticoagulants: Secondary | ICD-10-CM

## 2012-08-03 DIAGNOSIS — Z9889 Other specified postprocedural states: Secondary | ICD-10-CM

## 2012-08-03 DIAGNOSIS — I4891 Unspecified atrial fibrillation: Secondary | ICD-10-CM

## 2012-08-03 LAB — POCT INR: INR: 5.6

## 2012-08-03 NOTE — Telephone Encounter (Signed)
With afib and MVR needs lovenox bridge.  Does not need to be hospitalized for it.  Coumadin clinic should be able to help with this.  I replied to a flag from Verlee Monte on this last week or earlier this week Signed by Colon Branch, MD, [facility] on 05/18/2010 at 12:38 PM --------------------------------------------------------------------------------------------------------------------------------- Follow-up for Phone Call         Patient  is unable to afford her portion of the Lovenox which is $350.00 ( after the insurance pays).  She is asking if there is an alternative.  Dr Juanda Chance please advise Follow-up by: Darcey Nora RN, CGRN,  July 05, 2010 11:27 AM  Additional Follow-up for Phone Call  Additional follow up Details #1::        The cheapest way to do it is on Coumadin. If we continue Coumadin, she will not have to be bridged with Lovenox. Since this is a screening colonoscopy, we are not assuming she is going to have a large polyp. She needs to understand the potential limitation of a colonoscopy on Coumadin.  Additional Follow-up by: Hart Carwin MD,  July 05, 2010 12:33 PM -----------------------------------------------------------------------------------------------------------------------------------  ALSO- SEE NOTE FROM 11/29/11... Dr Juanda Chance, I received a call from [facility] stating that patient filled out the forms for Lovenox assistance. However, she is not a candidate for assistance since she already has insurance coverage. She is currently scheduled for a colonoscopy on 09/09/12 and will be bridged with lovenox if she agrees to purchase it. However, we have had this same issue in the past (as I have copied and pasted from centricity notes) and she never had colonoscopy. Should she decline to buy Lovenox again, would you still be agreeable to doing a colonoscopy ON Coumadin with the understanding that we would be unable to do any biopsies?

## 2012-08-03 NOTE — Telephone Encounter (Signed)
The way I understand the messages is that we are going to do the colonoscopy while on Coumadin ? Right?

## 2012-08-04 NOTE — Telephone Encounter (Signed)
I have spoken to Dr Juanda Chance. She is agreeable to doing colonoscopy ON coumadin if patient is unable to afford Lovenox. I have spoken to patient and have advised that we would like her to see how much lovenox will be (since insurance coverage for the lovenox may have changed). I have also spoken to [facility] at the Coumadin clinic and she says she will find out how much patient would need to pay out of pocket for the Lovenox so patient can decide if she can afford it.

## 2012-08-05 ENCOUNTER — Telehealth: Payer: Self-pay | Admitting: *Deleted

## 2012-08-05 NOTE — Telephone Encounter (Signed)
Cindy from [facility] called to let us know that she contacted patient's pharmacy regarding cost of Lovenox. The generic Lovenox will cost patient $6.00 per box. I have advised patient of this and she is advised as well to coutinue follow up with the coumadin clinic and follow dosing instructions given to her in the future regarding lovenox bridge.

## 2012-08-06 ENCOUNTER — Telehealth: Payer: Self-pay | Admitting: *Deleted

## 2012-08-06 NOTE — Telephone Encounter (Signed)
Message copied by Merrilyn Puma on Thu Aug 06, 2012  2:30 PM ------      Message from: Tresa Garter      Created: Tue Aug 04, 2012  5:08 PM       Misty Stanley, please, inform patient that all labs are OK      Thank you!

## 2012-08-06 NOTE — Telephone Encounter (Signed)
Called pt- not at home. Unable to leave vm.

## 2012-08-06 NOTE — Telephone Encounter (Signed)
Left message for patient to call back  

## 2012-08-06 NOTE — Telephone Encounter (Signed)
Message copied by Richardson Chiquito on Thu Aug 06, 2012 11:09 AM ------      Message from: Lodema Pilot D      Created: Wed Aug 05, 2012  2:32 PM       Nicole Cella,            I hate to tell you this but I talked to the pharmacy again and they said the co-pay for 30 syringes (that would be 3 boxes) would be $95.00.  I believe the patient's co-pay would be no more than around $35.00 per box.  (She will probably need 2 boxes)  At any rate her total co-pay would not be more than $95.00.  I'm really sorry for the confusion but I would rather the pt be prepared and not shocked when she gets there.  The pharmacy did price the generic drug.  Could you get that information to the patient?  Please call me to clarify. [phone]            Thanks so much and let me know if any problems.            Bailey Mech, RN

## 2012-08-07 NOTE — Telephone Encounter (Signed)
Pt informed

## 2012-08-10 ENCOUNTER — Ambulatory Visit (INDEPENDENT_AMBULATORY_CARE_PROVIDER_SITE_OTHER): Payer: Medicare Other | Admitting: General Practice

## 2012-08-10 ENCOUNTER — Other Ambulatory Visit: Payer: Medicare Other

## 2012-08-10 ENCOUNTER — Encounter: Payer: Self-pay | Admitting: Internal Medicine

## 2012-08-10 ENCOUNTER — Telehealth: Payer: Self-pay | Admitting: *Deleted

## 2012-08-10 DIAGNOSIS — R197 Diarrhea, unspecified: Secondary | ICD-10-CM

## 2012-08-10 DIAGNOSIS — I4891 Unspecified atrial fibrillation: Secondary | ICD-10-CM

## 2012-08-10 DIAGNOSIS — Z7901 Long term (current) use of anticoagulants: Secondary | ICD-10-CM

## 2012-08-10 DIAGNOSIS — Z9889 Other specified postprocedural states: Secondary | ICD-10-CM

## 2012-08-10 LAB — POCT INR: INR: 2.7

## 2012-08-10 NOTE — Telephone Encounter (Signed)
Left message for patient to call back  

## 2012-08-10 NOTE — Telephone Encounter (Signed)
yes

## 2012-08-10 NOTE — Telephone Encounter (Addendum)
R'cd call from GYN's office, pt is there today waiting to be seen, needs note from MD stating that she can be evaluated for hormonal changes and hot flashes, informed her AVP is out of office. Okay for letter-please advise.

## 2012-08-11 ENCOUNTER — Encounter: Payer: Self-pay | Admitting: *Deleted

## 2012-08-11 LAB — FECAL LACTOFERRIN, QUANT: Lactoferrin: POSITIVE

## 2012-08-11 NOTE — Telephone Encounter (Signed)
Letter faxed to GYN office.

## 2012-08-12 NOTE — Telephone Encounter (Signed)
Spoke to patient regarding Lovenox. She states that actually, she has already spoken to [place] at the coumadin clinic and is aware of the updated cost of the medication.

## 2012-08-24 ENCOUNTER — Ambulatory Visit (INDEPENDENT_AMBULATORY_CARE_PROVIDER_SITE_OTHER): Payer: Medicare Other | Admitting: General Practice

## 2012-08-24 ENCOUNTER — Other Ambulatory Visit: Payer: Self-pay | Admitting: General Practice

## 2012-08-24 DIAGNOSIS — Z7901 Long term (current) use of anticoagulants: Secondary | ICD-10-CM

## 2012-08-24 DIAGNOSIS — Z952 Presence of prosthetic heart valve: Secondary | ICD-10-CM

## 2012-08-24 DIAGNOSIS — Z9889 Other specified postprocedural states: Secondary | ICD-10-CM

## 2012-08-24 DIAGNOSIS — Z954 Presence of other heart-valve replacement: Secondary | ICD-10-CM

## 2012-08-24 DIAGNOSIS — I4891 Unspecified atrial fibrillation: Secondary | ICD-10-CM

## 2012-08-24 MED ORDER — ENOXAPARIN SODIUM 80 MG/0.8ML ~~LOC~~ SOLN: 80.0000 mg | Freq: Two times a day (BID) | SUBCUTANEOUS | Status: DC

## 2012-08-24 NOTE — Patient Instructions (Addendum)
11-8 Last dose of coumadin 11-9 Don't take anything (No coumadin and No lovenox) 11-10 Lovenox in AM and PM (12 hours apart) and NO COUMADIN 11-11 Lovenox in AM and PM AND NO COUMADIN 11-12 Lovenox in AM ONLY AND NO COUMADIN 11-13 DON'T TAKE ANYTHING 11-14 Lovenox in the AM and PM AND Take 15 mg of coumadin 11-15 Lovenox in the AM and PM AND Take 15 mg of coumadin 11-16 Lovenox in the AM and PM AND Take  10 mg of coumadin 11-17 Lovenox in the AM and PM AND Take 10 mg of coumadin 11-18 Don't take anything and check in the coumadin clinic.

## 2012-09-08 ENCOUNTER — Telehealth: Payer: Self-pay | Admitting: Internal Medicine

## 2012-09-08 NOTE — Telephone Encounter (Signed)
CALLED PATIENT BACK. PATIENT SAID SHE ATE [employer] AT NOON. SHE THOUGHT HER PROCEDURE WAS ON THE 14TH. SHE SAID SHE COULD NOT AFFORD HER PREP AND HAD CALLED TO SEE IF A MORE AFFORDABLE PREP COULD BE CALLED IN FOR HER. SPOKE WITH DR. Regino Schultze CMA,DOTTIE NELSON, PREP AVAILABLE FOR HER IF SHE CAN PICK IT UP BEFORE 5PM.  PATIENT INFORMED AND STRESSED IMPORTANCE OF MAINTAINING A CLEAR LIQUID DIET UNTIL 0800 TOMORROW AND THEN AFTER THATTIME NPO. PRINTED INSTRUCTIONS FOR PATIENT, AS SHE HAS MISS PLACED HER INSTRUCTIONS.

## 2012-09-09 ENCOUNTER — Ambulatory Visit (AMBULATORY_SURGERY_CENTER): Payer: Medicare Other | Admitting: Internal Medicine

## 2012-09-09 ENCOUNTER — Encounter: Payer: Self-pay | Admitting: Internal Medicine

## 2012-09-09 VITALS — BP 118/69 | HR 86 | Temp 96.7°F | Resp 21 | Ht 62.0 in | Wt 177.0 lb

## 2012-09-09 DIAGNOSIS — R634 Abnormal weight loss: Secondary | ICD-10-CM

## 2012-09-09 DIAGNOSIS — D126 Benign neoplasm of colon, unspecified: Secondary | ICD-10-CM

## 2012-09-09 DIAGNOSIS — Z7901 Long term (current) use of anticoagulants: Secondary | ICD-10-CM

## 2012-09-09 DIAGNOSIS — R197 Diarrhea, unspecified: Secondary | ICD-10-CM

## 2012-09-09 LAB — GLUCOSE, CAPILLARY
Glucose-Capillary: 93 mg/dL (ref 70–99)
Glucose-Capillary: 89 mg/dL (ref 70–99)

## 2012-09-09 MED ORDER — SODIUM CHLORIDE 0.9 % IV SOLN: 500.0000 mL | INTRAVENOUS | Status: DC

## 2012-09-09 NOTE — Patient Instructions (Addendum)
Findings:  Polyp, Diverticulosis Recommendations:  Resume Lovenox tonight and coumadin tomorrow.  YOU HAD AN ENDOSCOPIC PROCEDURE TODAY AT THE [facility]: Refer to the procedure report that was given to you for any specific questions about what was found during the examination.  If the procedure report does not answer your questions, please call your gastroenterologist to clarify.  If you requested that your care partner not be given the details of your procedure findings, then the procedure report has been included in a sealed envelope for you to review at your convenience later.  YOU SHOULD EXPECT: Some feelings of bloating in the abdomen. Passage of more gas than usual.  Walking can help get rid of the air that was put into your GI tract during the procedure and reduce the bloating. If you had a lower endoscopy (such as a colonoscopy or flexible sigmoidoscopy) you may notice spotting of blood in your stool or on the toilet paper. If you underwent a bowel prep for your procedure, then you may not have a normal bowel movement for a few days.  DIET: Your first meal following the procedure should be a light meal and then it is ok to progress to your normal diet.  A half-sandwich or bowl of soup is an example of a good first meal.  Heavy or fried foods are harder to digest and may make you feel nauseous or bloated.  Likewise meals heavy in dairy and vegetables can cause extra gas to form and this can also increase the bloating.  Drink plenty of fluids but you should avoid alcoholic beverages for 24 hours.  ACTIVITY: Your care partner should take you home directly after the procedure.  You should plan to take it easy, moving slowly for the rest of the day.  You can resume normal activity the day after the procedure however you should NOT DRIVE or use heavy machinery for 24 hours (because of the sedation medicines used during the test).    SYMPTOMS TO REPORT IMMEDIATELY: A gastroenterologist  can be reached at any hour.  During normal business hours, 8:30 AM to 5:00 PM Monday through Friday, call [phone].  After hours and on weekends, please call the GI answering service at [phone] who will take a message and have the physician on call contact you.   Following lower endoscopy (colonoscopy or flexible sigmoidoscopy):  Excessive amounts of blood in the stool  Significant tenderness or worsening of abdominal pains  Swelling of the abdomen that is new, acute  Fever of 100F or higher  Following upper endoscopy (EGD)  Vomiting of blood or coffee ground material  New chest pain or pain under the shoulder blades  Painful or persistently difficult swallowing  New shortness of breath  Fever of 100F or higher  Black, tarry-looking stools  FOLLOW UP: If any biopsies were taken you will be contacted by phone or by letter within the next 1-3 weeks.  Call your gastroenterologist if you have not heard about the biopsies in 3 weeks.  Our staff will call the home number listed on your records the next business day following your procedure to check on you and address any questions or concerns that you may have at that time regarding the information given to you following your procedure. This is a courtesy call and so if there is no answer at the home number and we have not heard from you through the emergency physician on call, we will assume that you have returned  to your regular daily activities without incident.  SIGNATURES/CONFIDENTIALITY: You and/or your care partner have signed paperwork which will be entered into your electronic medical record.  These signatures attest to the fact that that the information above on your After Visit Summary has been reviewed and is understood.  Full responsibility of the confidentiality of this discharge information lies with you and/or your care-partner.  Please follow all discharge instructions given to you by the recovery room nurse. If you  have any questions or problems after discharge please call one of the numbers listed above. You will receive a phone call in the am to see how you are doing and answer any questions you may have. Thank you for choosing [facility] for your health care needs.

## 2012-09-09 NOTE — Op Note (Signed)
[facility] [address] [place], [address]   COLONOSCOPY PROCEDURE REPORT  PATIENT: [name], Smoke  MR#: [id number] BIRTHDATE: 06-Dec-1940 , 71  yrs. old GENDER: Female ENDOSCOPIST: Hart Carwin, MD REFERRED BY:  Janeal Holmes, M.D. PROCEDURE DATE:  09/09/2012 PROCEDURE:   Colonoscopy with cold biopsy polypectomy ASA CLASS:   Class III INDICATIONS:average risk patient for colon cancer, change in bowel habits, and last colon 2001, diarrhea, weight loss, hx MVR on Lovenox bridge. MEDICATIONS: MAC sedation, administered by CRNA and Propofol (Diprivan) 200 mg IV  DESCRIPTION OF PROCEDURE:   After the risks and benefits and of the procedure were explained, informed consent was obtained.  A digital rectal exam revealed no abnormalities of the rectum.    The LB [id number]  endoscope was introduced through the anus and advanced to the cecum, which was identified by both the appendix and ileocecal valve .  The quality of the prep was good, using MoviPrep .  The instrument was then slowly withdrawn as the colon was fully examined.     COLON FINDINGS: A smooth sessile polyp ranging between 3-43mm in size was found in the sigmoid colon.  A polypectomy was performed with cold forceps.  The resection was complete and the polyp tissue was completely retrieved.   Lipoma right colon.   Moderate diverticulosis was noted in the sigmoid colon and throughout the entire examined colon.     Retroflexed views revealed no abnormalities.     The scope was then withdrawn from the patient and the procedure completed.  COMPLICATIONS: There were no complications. ENDOSCOPIC IMPRESSION: 1.   Sessile polyp ranging between 3-59mm in size was found in the sigmoid colon; polypectomy was performed with cold forceps 2.   Lipoma right colon 3.   Moderate diverticulosis was noted in the sigmoid colon and throughout the entire examined colon  RECOMMENDATIONS: 1.  Await  pathology results 2.  resume Lovenox toniight, resume Coumadin tomorrow, have Coumadin clinic to check your PTime on 09/11/2012   REPEAT EXAM: In 10 year(s)  for Colonoscopy.  cc:  _______________________________ eSignedHart Carwin, MD 09/09/2012 12:19 PM     PATIENT NAME:  Beth Miller, Beth Miller MR#: [id number]

## 2012-09-09 NOTE — Progress Notes (Signed)
Patient did not experience any of the following events: a burn prior to discharge; a fall within the facility; wrong site/side/patient/procedure/implant event; or a hospital transfer or hospital admission upon discharge from the facility. (G8907) Patient did not have preoperative order for IV antibiotic SSI prophylaxis. (G8918)  

## 2012-09-10 ENCOUNTER — Telehealth: Payer: Self-pay | Admitting: *Deleted

## 2012-09-10 NOTE — Telephone Encounter (Signed)
  Follow up Call-  Call back number 09/09/2012  Post procedure Call Back phone  # [phone]  Permission to leave phone message Yes     Patient questions:  Do you have a fever, pain , or abdominal swelling? no Pain Score  0 *  Have you tolerated food without any problems? yes  Have you been able to return to your normal activities? yes  Do you have any questions about your discharge instructions: Diet   no Medications  yes Follow up visit  no  Do you have questions or concerns about your Care? yes  Actions: * If pain score is 4 or above: No action needed, pain <4.  Pt. States when went to the bathroom to urinate, she passed gas and noted what looked like approx, "a tablespoon" of pus on tissue. She has not has a bowel movement as yet post colonscopy.  She denies pain, abdominal distention, or fever.  Advised that after eating she could have some Left over prep in bowel that could have made this material yellow tinted.  I advised her to watch for fever, abdominal distention, or feelings of malaise and to  Report if needed.

## 2012-09-14 ENCOUNTER — Telehealth: Payer: Self-pay | Admitting: General Practice

## 2012-09-14 ENCOUNTER — Ambulatory Visit (INDEPENDENT_AMBULATORY_CARE_PROVIDER_SITE_OTHER): Payer: Medicare Other | Admitting: General Practice

## 2012-09-14 ENCOUNTER — Encounter: Payer: Self-pay | Admitting: Internal Medicine

## 2012-09-14 DIAGNOSIS — I4891 Unspecified atrial fibrillation: Secondary | ICD-10-CM

## 2012-09-14 DIAGNOSIS — Z9889 Other specified postprocedural states: Secondary | ICD-10-CM

## 2012-09-14 DIAGNOSIS — Z7901 Long term (current) use of anticoagulants: Secondary | ICD-10-CM

## 2012-09-14 LAB — POCT INR: INR: 1.5

## 2012-09-14 NOTE — Telephone Encounter (Signed)
Called patient to make sure patient understands coumadin dosing.  Instructed patient to take 15 mg coumadin today (Monday), 15 mg Tuesday, 12.5 Wednesday and Thursday.  Also instructed patient to keep taking the Lovenox injections and to keep appointment with coumadin clinic on Friday 11-22.  Patient verbalized understanding and agreed to instructions.

## 2012-09-18 ENCOUNTER — Ambulatory Visit (INDEPENDENT_AMBULATORY_CARE_PROVIDER_SITE_OTHER): Payer: Medicare Other | Admitting: General Practice

## 2012-09-18 DIAGNOSIS — Z7901 Long term (current) use of anticoagulants: Secondary | ICD-10-CM

## 2012-09-18 DIAGNOSIS — Z9889 Other specified postprocedural states: Secondary | ICD-10-CM

## 2012-09-18 DIAGNOSIS — I4891 Unspecified atrial fibrillation: Secondary | ICD-10-CM

## 2012-09-28 ENCOUNTER — Ambulatory Visit (INDEPENDENT_AMBULATORY_CARE_PROVIDER_SITE_OTHER): Payer: Medicare Other | Admitting: *Deleted

## 2012-09-28 ENCOUNTER — Ambulatory Visit (INDEPENDENT_AMBULATORY_CARE_PROVIDER_SITE_OTHER): Payer: Medicare Other | Admitting: General Practice

## 2012-09-28 DIAGNOSIS — Z23 Encounter for immunization: Secondary | ICD-10-CM

## 2012-09-28 DIAGNOSIS — Z9889 Other specified postprocedural states: Secondary | ICD-10-CM

## 2012-09-28 DIAGNOSIS — I4891 Unspecified atrial fibrillation: Secondary | ICD-10-CM

## 2012-09-28 DIAGNOSIS — Z7901 Long term (current) use of anticoagulants: Secondary | ICD-10-CM

## 2012-09-28 LAB — POCT INR: INR: 2.9

## 2012-09-30 ENCOUNTER — Ambulatory Visit: Payer: Medicare Other | Admitting: Internal Medicine

## 2012-10-09 ENCOUNTER — Encounter: Payer: Self-pay | Admitting: Internal Medicine

## 2012-10-09 ENCOUNTER — Ambulatory Visit (INDEPENDENT_AMBULATORY_CARE_PROVIDER_SITE_OTHER): Payer: Medicare Other | Admitting: Internal Medicine

## 2012-10-09 VITALS — BP 108/70 | HR 80 | Temp 97.8°F | Resp 16 | Wt 174.0 lb

## 2012-10-09 DIAGNOSIS — I10 Essential (primary) hypertension: Secondary | ICD-10-CM

## 2012-10-09 DIAGNOSIS — E559 Vitamin D deficiency, unspecified: Secondary | ICD-10-CM

## 2012-10-09 DIAGNOSIS — M545 Low back pain, unspecified: Secondary | ICD-10-CM

## 2012-10-09 DIAGNOSIS — E119 Type 2 diabetes mellitus without complications: Secondary | ICD-10-CM

## 2012-10-09 DIAGNOSIS — Z7901 Long term (current) use of anticoagulants: Secondary | ICD-10-CM

## 2012-10-09 DIAGNOSIS — G459 Transient cerebral ischemic attack, unspecified: Secondary | ICD-10-CM

## 2012-10-09 MED ORDER — SPIRONOLACTONE 25 MG PO TABS: 25.0000 mg | ORAL_TABLET | Freq: Every day | ORAL | Status: DC

## 2012-10-09 MED ORDER — OMEPRAZOLE 10 MG PO CPDR: 10.0000 mg | DELAYED_RELEASE_CAPSULE | Freq: Every day | ORAL | Status: DC

## 2012-10-09 MED ORDER — FLUTICASONE PROPIONATE 50 MCG/ACT NA SUSP: 2.0000 | Freq: Every day | NASAL | Status: DC

## 2012-10-09 NOTE — Progress Notes (Signed)
   Subjective:    HPI   The patient presents for a follow-up of  chronic hypertension, chronic dyslipidemia, anticoagulation, controlled with medicines. F/u OA, R knee pain  Wt Readings from Last 3 Encounters:  10/09/12 174 lb (78.926 kg)  09/09/12 177 lb (80.287 kg)  07/17/12 177 lb 9.6 oz (80.559 kg)   BP Readings from Last 3 Encounters:  10/09/12 108/70  09/09/12 118/69  07/17/12 104/60     Review of Systems  Constitutional: Negative for chills, activity change, appetite change, fatigue and unexpected weight change.  HENT: Negative for congestion, mouth sores and sinus pressure.   Eyes: Negative for visual disturbance.  Respiratory: Negative for cough and chest tightness.   Gastrointestinal: Negative for nausea and abdominal pain.  Genitourinary: Negative for frequency, difficulty urinating and vaginal pain.  Musculoskeletal: Positive for arthralgias (R knee). Negative for back pain and gait problem.  Skin: Negative for pallor and rash.  Neurological: Negative for dizziness, tremors, weakness, numbness and headaches.  Psychiatric/Behavioral: Negative for confusion and sleep disturbance.       Objective:   Physical Exam  Constitutional: She appears well-developed. No distress.       obese  HENT:  Head: Normocephalic.  Right Ear: External ear normal.  Left Ear: External ear normal.  Nose: Nose normal.  Mouth/Throat: Oropharynx is clear and moist.  Eyes: Conjunctivae normal are normal. Pupils are equal, round, and reactive to light. Right eye exhibits no discharge. Left eye exhibits no discharge.  Neck: Normal range of motion. Neck supple. No JVD present. No tracheal deviation present. No thyromegaly present.  Cardiovascular: Normal rate and normal heart sounds.        Irregular  Pulmonary/Chest: No stridor. No respiratory distress. She has no wheezes.  Abdominal: Soft. Bowel sounds are normal. She exhibits no distension and no mass. There is no tenderness. There is  no rebound and no guarding.  Musculoskeletal: She exhibits tenderness (R knee w/mild tenderness). She exhibits no edema.  Lymphadenopathy:    She has no cervical adenopathy.  Neurological: She displays normal reflexes. No cranial nerve deficit. She exhibits normal muscle tone. Coordination normal.  Skin: No rash noted. No erythema.  Psychiatric: She has a normal mood and affect. Her behavior is normal. Judgment and thought content normal.   Lab Results  Component Value Date   WBC 4.9 06/01/2012   HGB 12.7 06/01/2012   HCT 39.2 06/01/2012   PLT 258.0 06/01/2012   GLUCOSE 110* 06/01/2012   CHOL 123 06/01/2012   TRIG 121.0 06/01/2012   HDL 45.10 06/01/2012   LDLCALC 54 06/01/2012   ALT 16 06/01/2012   AST 25 06/01/2012   NA 140 06/01/2012   K 4.4 06/01/2012   CL 106 06/01/2012   CREATININE 0.9 06/01/2012   BUN 13 06/01/2012   CO2 27 06/01/2012   TSH 1.44 06/01/2012   INR 2.9 09/28/2012   HGBA1C 5.9 06/01/2012          Assessment & Plan:

## 2012-10-11 NOTE — Assessment & Plan Note (Signed)
Continue with current prescription therapy as reflected on the Med list.  

## 2012-10-11 NOTE — Assessment & Plan Note (Signed)
No relapse Continue with current prescription therapy as reflected on the Med list.  

## 2012-10-11 NOTE — Assessment & Plan Note (Signed)
[facility]

## 2012-10-14 ENCOUNTER — Other Ambulatory Visit: Payer: Self-pay | Admitting: *Deleted

## 2012-10-14 ENCOUNTER — Telehealth: Payer: Self-pay | Admitting: *Deleted

## 2012-10-14 NOTE — Telephone Encounter (Signed)
RECEIVED PRIOR AUTH FORM FROM  [facility] NEEDING PRIOR AUTH FOR DIGOXIN  0.25 MG  PER DR NISHAN PT MAY STOP MED. PT NOTIFIED ,STATED NEVER ORDERED MED UNCLEAR IF PT  WILL STOP STATES GETS FROM  LOCAL PHARMACY  .Zack Seal

## 2012-10-19 ENCOUNTER — Ambulatory Visit (INDEPENDENT_AMBULATORY_CARE_PROVIDER_SITE_OTHER): Payer: Medicare Other | Admitting: General Practice

## 2012-10-19 DIAGNOSIS — I4891 Unspecified atrial fibrillation: Secondary | ICD-10-CM

## 2012-10-19 DIAGNOSIS — Z7901 Long term (current) use of anticoagulants: Secondary | ICD-10-CM

## 2012-10-19 DIAGNOSIS — Z9889 Other specified postprocedural states: Secondary | ICD-10-CM

## 2012-10-19 LAB — POCT INR: INR: 3

## 2012-11-19 ENCOUNTER — Ambulatory Visit (INDEPENDENT_AMBULATORY_CARE_PROVIDER_SITE_OTHER): Payer: Medicare Other | Admitting: General Practice

## 2012-11-19 DIAGNOSIS — Z9889 Other specified postprocedural states: Secondary | ICD-10-CM

## 2012-11-19 DIAGNOSIS — Z7901 Long term (current) use of anticoagulants: Secondary | ICD-10-CM

## 2012-11-19 DIAGNOSIS — I4891 Unspecified atrial fibrillation: Secondary | ICD-10-CM

## 2012-12-07 ENCOUNTER — Telehealth: Payer: Self-pay | Admitting: Internal Medicine

## 2012-12-07 NOTE — Telephone Encounter (Signed)
Patient is just calling to let the office know that she has put in an order for a cpap machine and diabetic testing supplies and the supply company should be contacting our office to get further information

## 2012-12-09 NOTE — Telephone Encounter (Signed)
noted 

## 2012-12-23 ENCOUNTER — Ambulatory Visit (INDEPENDENT_AMBULATORY_CARE_PROVIDER_SITE_OTHER): Payer: Medicare Other | Admitting: General Practice

## 2012-12-23 ENCOUNTER — Telehealth: Payer: Self-pay | Admitting: Internal Medicine

## 2012-12-23 DIAGNOSIS — Z7901 Long term (current) use of anticoagulants: Secondary | ICD-10-CM

## 2012-12-23 DIAGNOSIS — Z9889 Other specified postprocedural states: Secondary | ICD-10-CM

## 2012-12-23 DIAGNOSIS — I4891 Unspecified atrial fibrillation: Secondary | ICD-10-CM

## 2012-12-23 NOTE — Telephone Encounter (Signed)
The patient came in and wanted to get a message to you that her medication plan is not covering Digoxin.  She is hoping Dr.Plotnikov can inform them that is medically necessary for her to be on the medication.  She states if he approves this, the plan will cover this medication.   pts callback - [phone] or [phone]

## 2013-01-05 ENCOUNTER — Ambulatory Visit (INDEPENDENT_AMBULATORY_CARE_PROVIDER_SITE_OTHER): Payer: Medicare Other | Admitting: General Practice

## 2013-01-05 DIAGNOSIS — Z7901 Long term (current) use of anticoagulants: Secondary | ICD-10-CM

## 2013-01-05 DIAGNOSIS — I4891 Unspecified atrial fibrillation: Secondary | ICD-10-CM

## 2013-01-05 DIAGNOSIS — Z9889 Other specified postprocedural states: Secondary | ICD-10-CM

## 2013-01-07 NOTE — Telephone Encounter (Signed)
I called pharmacy. They state pt filled this Rx on 12/21/12 and paid $8. No PA is required. Left detailed mess informing pt.

## 2013-01-08 ENCOUNTER — Encounter: Payer: Self-pay | Admitting: Nurse Practitioner

## 2013-01-08 ENCOUNTER — Ambulatory Visit: Payer: Medicare Other | Admitting: Nurse Practitioner

## 2013-01-08 ENCOUNTER — Ambulatory Visit
Admission: RE | Admit: 2013-01-08 | Discharge: 2013-01-08 | Disposition: A | Discharge status: Home / Self Care | Payer: Medicare Other | Source: Ambulatory Visit | Attending: Nurse Practitioner | Admitting: Nurse Practitioner

## 2013-01-08 ENCOUNTER — Ambulatory Visit (INDEPENDENT_AMBULATORY_CARE_PROVIDER_SITE_OTHER): Payer: Medicare Other | Admitting: Nurse Practitioner

## 2013-01-08 VITALS — BP 110/64 | HR 62 | Ht 62.0 in | Wt 175.0 lb

## 2013-01-08 DIAGNOSIS — R0989 Other specified symptoms and signs involving the circulatory and respiratory systems: Secondary | ICD-10-CM

## 2013-01-08 DIAGNOSIS — R5383 Other fatigue: Secondary | ICD-10-CM

## 2013-01-08 DIAGNOSIS — R634 Abnormal weight loss: Secondary | ICD-10-CM

## 2013-01-08 DIAGNOSIS — R5381 Other malaise: Secondary | ICD-10-CM

## 2013-01-08 DIAGNOSIS — R252 Cramp and spasm: Secondary | ICD-10-CM

## 2013-01-08 DIAGNOSIS — R0609 Other forms of dyspnea: Secondary | ICD-10-CM

## 2013-01-08 DIAGNOSIS — R079 Chest pain, unspecified: Secondary | ICD-10-CM

## 2013-01-08 DIAGNOSIS — R002 Palpitations: Secondary | ICD-10-CM

## 2013-01-08 DIAGNOSIS — R0600 Dyspnea, unspecified: Secondary | ICD-10-CM

## 2013-01-08 LAB — CBC WITH DIFFERENTIAL/PLATELET
Basophils Absolute: 0 10*3/uL (ref 0.0–0.1)
Basophils Relative: 1 % (ref 0.0–3.0)
Eosinophils Absolute: 0.1 10*3/uL (ref 0.0–0.7)
Eosinophils Relative: 1.2 % (ref 0.0–5.0)
HCT: 33.1 % — ABNORMAL LOW (ref 36.0–46.0)
Hemoglobin: 10.9 g/dL — ABNORMAL LOW (ref 12.0–15.0)
Lymphocytes Relative: 34.3 % (ref 12.0–46.0)
Lymphs Abs: 1.5 10*3/uL (ref 0.7–4.0)
MCHC: 33.1 g/dL (ref 30.0–36.0)
MCV: 82.9 fl (ref 78.0–100.0)
Monocytes Absolute: 0.5 10*3/uL (ref 0.1–1.0)
Monocytes Relative: 12.1 % — ABNORMAL HIGH (ref 3.0–12.0)
Neutro Abs: 2.3 10*3/uL (ref 1.4–7.7)
Neutrophils Relative %: 51.4 % (ref 43.0–77.0)
Platelets: 292 10*3/uL (ref 150.0–400.0)
RBC: 3.99 Mil/uL (ref 3.87–5.11)
RDW: 16.3 % — ABNORMAL HIGH (ref 11.5–14.6)
WBC: 4.4 10*3/uL — ABNORMAL LOW (ref 4.5–10.5)

## 2013-01-08 LAB — BASIC METABOLIC PANEL
BUN: 12 mg/dL (ref 6–23)
CO2: 28 mEq/L (ref 19–32)
Calcium: 8.9 mg/dL (ref 8.4–10.5)
Chloride: 106 mEq/L (ref 96–112)
Creatinine, Ser: 0.9 mg/dL (ref 0.4–1.2)
GFR: 75.91 mL/min (ref >60.00)
Glucose, Bld: 108 mg/dL — ABNORMAL HIGH (ref 70–99)
Potassium: 3.7 mEq/L (ref 3.5–5.1)
Sodium: 139 mEq/L (ref 135–145)

## 2013-01-08 LAB — TSH: TSH: 1.71 u[IU]/mL (ref 0.35–5.50)

## 2013-01-08 LAB — MAGNESIUM: Magnesium: 1.9 mg/dL (ref 1.5–2.5)

## 2013-01-08 NOTE — Progress Notes (Signed)
Georges Lynch Date of Birth: 18-Feb-1941 Medical Record #[id number]  History of Present Illness: Ms. Fantroy is seen back today for a work in visit. She is seen for Dr. [name]. Not seen here since January of 2013. Has multiple issues which include a prosthetic MV - dating back to 1995, chronic atrial fib, on coumadin and carotid disease. She is obese. Past smoker and she is diabetic. Other issues are as noted below. Last echo was in 2013. Her EF is normal.   She comes in today. She is here alone. She has multiple complaints. She notes that she has not felt well for the last couple of months. Her energy level has dropped. She has been more short of breath. Over the past few weeks she has had some "heaviness" under the left breast - does not seem to be exertional in nature. Can last for a few minutes at a time. She tries to stay active - bowls twice a week but does not endorse a regular exercise program. Her weight is down 13 pounds. She has had a couple of bouts of nausea with diarrhea over the past year - saw GI and had a basically normal colonoscopy. Used Librarian, academic and Immodium with her last spell about 2 weeks ago. Does not know what her sugars are - does not check. No smoking. Has cramps - was changed from Lasix/Potassium to Aldactone and she notes no improvement. She has actually missed numerous doses of the Aldactone. She does note some chills, sweats and back pain. She will have some occasional palpitations. She is worried that it is time for valve replacement again.    Current Outpatient Prescriptions on File Prior to Visit  Medication Sig Dispense Refill  . amoxicillin (AMOXIL) 500 MG tablet Take four tablets one hour prior to dental procedure  4 tablet  6  . Cholecalciferol (VITAMIN D3) 1000 UNIT tablet Take 1,000 Units by mouth daily.        Marland Kitchen CLEVER CHEK AUTO-CODE VOICE test strip USE 1 TIME DAILY FOR DIABETIC TESTING  100 each  3  . Diclofenac Sodium (PENNSAID) 1.5 % SOLN Place 3-5 drops  onto the skin 3 (three) times daily. For pain       . digoxin (LANOXIN) 0.25 MG tablet Take 1 tablet (250 mcg total) by mouth daily.  30 tablet  11  . diltiazem (CARDIZEM CD) 240 MG 24 hr capsule Take 1 capsule (240 mg total) by mouth daily.  30 capsule  11  . fluticasone (FLONASE) 50 MCG/ACT nasal spray Place 2 sprays into the nose daily.  16 g  6  . glucosamine-chondroitin 500-400 MG tablet Take 1 tablet by mouth daily.        . Lancets MISC USE 1 TIME DAILY FOR DIABETIC TESTING  100 each  3  . loratadine (CLARITIN) 10 MG tablet Take 10 mg by mouth daily. As needed for allergies       . metaxalone (SKELAXIN) 800 MG tablet Take 800 mg by mouth 3 (three) times daily.       . Omega-3 Fatty Acids (FISH OIL) 1000 MG CAPS Take 1 capsule by mouth daily.        Marland Kitchen omeprazole (PRILOSEC) 10 MG capsule Take 1 capsule (10 mg total) by mouth daily.  30 capsule  11  . Probiotic Product (ALIGN) 4 MG CAPS Take 1 capsule by mouth daily.  7 capsule  0  . warfarin (COUMADIN) 10 MG tablet Take 1 tablet (10 mg total) by  mouth as directed. As directed by Anticoagulation Clinic  30 tablet  11  . warfarin (COUMADIN) 5 MG tablet Take 0.5-1 tablets (2.5-5 mg total) by mouth as directed.  30 tablet  11  . promethazine (PHENERGAN) 25 MG tablet Take 25 mg by mouth every 6 (six) hours as needed.       No current facility-administered medications on file prior to visit.    Allergies  Allergen Reactions  . Food     Optometrist  . Quinine   . Sulfadiazine     Past Medical History  Diagnosis Date  . Hypertension   . Diabetes mellitus   . GERD (gastroesophageal reflux disease)   . AF (atrial fibrillation)   . Chronic anticoagulation   . Chest pain   . Hematoma   . Diverticulosis   . Paresthesia   . Cataract, senile   . Disorder of oral soft tissue     of mouth  . Leg pain   . Sweating   . ALLERGIC RHINITIS   . Low back pain   . Diabetes mellitus   . Status post mitral valve replacement     St. Jude  valve  . Vitamin D deficiency   . Blood transfusion without reported diagnosis   . CHF (congestive heart failure)   . Rheumatic fever   . Stroke     tia    Past Surgical History  Procedure Laterality Date  . Abdominal hysterectomy    . Cholecystectomy    . Cardiac valve replacement  1995    Mitral valve prosthesis; [facility]    History  Smoking status  . Former Smoker  . Quit date: 10/28/2004  Smokeless tobacco  . Never Used    History  Alcohol Use No    Family History  Problem Relation Age of Onset  . Hypertension    . Diabetes Mother   . Seizures Mother   . Diabetes Brother   . Heart disease Brother   . Pancreatic cancer Brother   . Colon cancer Neg Hx   . Other Mother     brain tumor    Review of Systems: The review of systems is per the HPI.  All other systems were reviewed and are negative.  Physical Exam: BP 110/64  Pulse 62  Ht 5\' 2"  (1.575 m)  Wt 175 lb (79.379 kg)  BMI 32 kg/m2 Patient is very pleasant and in no acute distress. Skin is warm and dry. Color is normal.  HEENT is unremarkable. Normocephalic/atraumatic. PERRL. Sclera are nonicteric. Neck is supple. No masses. No JVD. Lungs are clear. Cardiac exam shows an irregular rhythm. Valve sounds crisp. Her rate is controlled. Abdomen is soft. Extremities are with trace edema. Gait and ROM are intact. No gross neurologic deficits noted.  LABORATORY DATA: EKG shows atrial fib with a controlled ventricular response.    Lab Results  Component Value Date   WBC 4.9 06/01/2012   HGB 12.7 06/01/2012   HCT 39.2 06/01/2012   PLT 258.0 06/01/2012   GLUCOSE 110* 06/01/2012   CHOL 123 06/01/2012   TRIG 121.0 06/01/2012   HDL 45.10 06/01/2012   LDLCALC 54 06/01/2012   ALT 16 06/01/2012   AST 25 06/01/2012   NA 140 06/01/2012   K 4.4 06/01/2012   CL 106 06/01/2012   CREATININE 0.9 06/01/2012   BUN 13 06/01/2012   CO2 27 06/01/2012   TSH 1.44 06/01/2012   INR 2.8 01/05/2013   HGBA1C 5.9 06/01/2012  Assessment / Plan: 1. Multitude  of somatic complaints which include chest pain/fatigue/dyspnea - will update her echo and arrange for myoview. Checking labs today and send for CXR as well.   2. Chronic atrial fib - on coumadin - her rate is ok.   3. Nausea/diarrhea/weight loss - may need to get back to GI  4. Fatigue - checking labs today.   5. Cramps - will check potassium and magnesium  Further disposition to follow. She has follow up with Dr. [name] in 2 weeks already arranged.   Patient is agreeable to this plan and will call if any problems develop in the interim.   Rosalio Macadamia, RN, ANP-C [facility] [address] [place], [place]  [address]

## 2013-01-08 NOTE — Patient Instructions (Addendum)
Stay on your current medicines  We are checking blood work today  We are going to check an ultrasound of your heart  We are going to do a stress test  Go to [facility] for a chest xray - you can walk in  We may be sending you back to Dr. Juanda Chance for your GI issues  See Dr. [name] back in April as planned.  Call the [facility] office at [phone] if you have any questions, problems or concerns.

## 2013-01-09 LAB — DIGOXIN LEVEL: Digoxin Level: 1 ng/mL (ref 0.8–2.0)

## 2013-01-11 ENCOUNTER — Ambulatory Visit: Payer: Medicare Other | Admitting: Internal Medicine

## 2013-01-13 ENCOUNTER — Ambulatory Visit: Payer: Medicare Other | Admitting: Internal Medicine

## 2013-01-13 ENCOUNTER — Ambulatory Visit (INDEPENDENT_AMBULATORY_CARE_PROVIDER_SITE_OTHER): Payer: Medicare Other | Admitting: Internal Medicine

## 2013-01-13 ENCOUNTER — Ambulatory Visit (HOSPITAL_COMMUNITY): Payer: Medicare Other | Attending: Cardiology | Admitting: Radiology

## 2013-01-13 ENCOUNTER — Other Ambulatory Visit (HOSPITAL_COMMUNITY): Payer: Medicare Other

## 2013-01-13 ENCOUNTER — Encounter: Payer: Self-pay | Admitting: Internal Medicine

## 2013-01-13 VITALS — BP 110/70 | HR 72 | Temp 98.0°F | Resp 16 | Wt 174.0 lb

## 2013-01-13 DIAGNOSIS — R0600 Dyspnea, unspecified: Secondary | ICD-10-CM

## 2013-01-13 DIAGNOSIS — D649 Anemia, unspecified: Secondary | ICD-10-CM

## 2013-01-13 DIAGNOSIS — E119 Type 2 diabetes mellitus without complications: Secondary | ICD-10-CM | POA: Insufficient documentation

## 2013-01-13 DIAGNOSIS — R634 Abnormal weight loss: Secondary | ICD-10-CM

## 2013-01-13 DIAGNOSIS — Z87891 Personal history of nicotine dependence: Secondary | ICD-10-CM | POA: Insufficient documentation

## 2013-01-13 DIAGNOSIS — I4891 Unspecified atrial fibrillation: Secondary | ICD-10-CM | POA: Insufficient documentation

## 2013-01-13 DIAGNOSIS — K219 Gastro-esophageal reflux disease without esophagitis: Secondary | ICD-10-CM

## 2013-01-13 DIAGNOSIS — I059 Rheumatic mitral valve disease, unspecified: Secondary | ICD-10-CM

## 2013-01-13 DIAGNOSIS — I079 Rheumatic tricuspid valve disease, unspecified: Secondary | ICD-10-CM | POA: Insufficient documentation

## 2013-01-13 DIAGNOSIS — R5383 Other fatigue: Secondary | ICD-10-CM

## 2013-01-13 DIAGNOSIS — E559 Vitamin D deficiency, unspecified: Secondary | ICD-10-CM

## 2013-01-13 DIAGNOSIS — Z9889 Other specified postprocedural states: Secondary | ICD-10-CM

## 2013-01-13 DIAGNOSIS — Z954 Presence of other heart-valve replacement: Secondary | ICD-10-CM | POA: Insufficient documentation

## 2013-01-13 DIAGNOSIS — R079 Chest pain, unspecified: Secondary | ICD-10-CM

## 2013-01-13 DIAGNOSIS — I359 Nonrheumatic aortic valve disorder, unspecified: Secondary | ICD-10-CM | POA: Insufficient documentation

## 2013-01-13 DIAGNOSIS — R072 Precordial pain: Secondary | ICD-10-CM

## 2013-01-13 MED ORDER — FUROSEMIDE 20 MG PO TABS: 10.0000 mg | ORAL_TABLET | Freq: Every day | ORAL | Status: DC | PRN

## 2013-01-13 NOTE — Assessment & Plan Note (Signed)
Continue with current prescription therapy as reflected on the Med list.  

## 2013-01-13 NOTE — Assessment & Plan Note (Signed)
Monitoring CBC 

## 2013-01-13 NOTE — Progress Notes (Signed)
Echocardiogram performed by Vanessa Poole.   

## 2013-01-13 NOTE — Assessment & Plan Note (Signed)
Labs

## 2013-01-13 NOTE — Progress Notes (Signed)
Patient ID: Beth Miller, female   DOB: Aug 15, 1941, 72 y.o.   MRN: [id number]   Subjective:    HPI   The patient presents for a follow-up of  chronic hypertension, chronic dyslipidemia, anticoagulation, controlled with medicines. F/u OA, R knee pain  Wt Readings from Last 3 Encounters:  01/13/13 174 lb (78.926 kg)  01/08/13 175 lb (79.379 kg)  10/09/12 174 lb (78.926 kg)   BP Readings from Last 3 Encounters:  01/13/13 110/70  01/08/13 110/64  10/09/12 108/70     Review of Systems  Constitutional: Negative for chills, activity change, appetite change, fatigue and unexpected weight change.  HENT: Negative for congestion, mouth sores and sinus pressure.   Eyes: Negative for visual disturbance.  Respiratory: Negative for cough and chest tightness.   Gastrointestinal: Negative for nausea and abdominal pain.  Genitourinary: Negative for frequency, difficulty urinating and vaginal pain.  Musculoskeletal: Positive for arthralgias (R knee). Negative for back pain and gait problem.  Skin: Negative for pallor and rash.  Neurological: Negative for dizziness, tremors, weakness, numbness and headaches.  Psychiatric/Behavioral: Negative for confusion and sleep disturbance.       Objective:   Physical Exam  Constitutional: She appears well-developed. No distress.  obese  HENT:  Head: Normocephalic.  Right Ear: External ear normal.  Left Ear: External ear normal.  Nose: Nose normal.  Mouth/Throat: Oropharynx is clear and moist.  Eyes: Conjunctivae are normal. Pupils are equal, round, and reactive to light. Right eye exhibits no discharge. Left eye exhibits no discharge.  Neck: Normal range of motion. Neck supple. No JVD present. No tracheal deviation present. No thyromegaly present.  Cardiovascular: Normal rate and normal heart sounds.   Irregular  Pulmonary/Chest: No stridor. No respiratory distress. She has no wheezes.  Abdominal: Soft. Bowel sounds are normal. She exhibits no  distension and no mass. There is no tenderness. There is no rebound and no guarding.  Musculoskeletal: She exhibits tenderness (R knee w/mild tenderness). She exhibits no edema.  Lymphadenopathy:    She has no cervical adenopathy.  Neurological: She displays normal reflexes. No cranial nerve deficit. She exhibits normal muscle tone. Coordination normal.  Skin: No rash noted. No erythema.  Psychiatric: She has a normal mood and affect. Her behavior is normal. Judgment and thought content normal.   Lab Results  Component Value Date   WBC 4.4* 01/08/2013   HGB 10.9* 01/08/2013   HCT 33.1* 01/08/2013   PLT 292.0 01/08/2013   GLUCOSE 108* 01/08/2013   CHOL 123 06/01/2012   TRIG 121.0 06/01/2012   HDL 45.10 06/01/2012   LDLCALC 54 06/01/2012   ALT 16 06/01/2012   AST 25 06/01/2012   NA 139 01/08/2013   K 3.7 01/08/2013   CL 106 01/08/2013   CREATININE 0.9 01/08/2013   BUN 12 01/08/2013   CO2 28 01/08/2013   TSH 1.71 01/08/2013   INR 2.8 01/05/2013   HGBA1C 5.9 06/01/2012          Assessment & Plan:

## 2013-01-18 ENCOUNTER — Ambulatory Visit (HOSPITAL_COMMUNITY): Payer: Medicare Other | Attending: Cardiovascular Disease | Admitting: Radiology

## 2013-01-18 VITALS — BP 102/57 | Ht 62.0 in | Wt 170.0 lb

## 2013-01-18 DIAGNOSIS — R0989 Other specified symptoms and signs involving the circulatory and respiratory systems: Secondary | ICD-10-CM | POA: Insufficient documentation

## 2013-01-18 DIAGNOSIS — R0609 Other forms of dyspnea: Secondary | ICD-10-CM | POA: Insufficient documentation

## 2013-01-18 DIAGNOSIS — R5383 Other fatigue: Secondary | ICD-10-CM

## 2013-01-18 DIAGNOSIS — I491 Atrial premature depolarization: Secondary | ICD-10-CM

## 2013-01-18 DIAGNOSIS — R634 Abnormal weight loss: Secondary | ICD-10-CM

## 2013-01-18 DIAGNOSIS — R0789 Other chest pain: Secondary | ICD-10-CM

## 2013-01-18 DIAGNOSIS — Z8249 Family history of ischemic heart disease and other diseases of the circulatory system: Secondary | ICD-10-CM | POA: Insufficient documentation

## 2013-01-18 DIAGNOSIS — R079 Chest pain, unspecified: Secondary | ICD-10-CM

## 2013-01-18 DIAGNOSIS — R002 Palpitations: Secondary | ICD-10-CM | POA: Insufficient documentation

## 2013-01-18 DIAGNOSIS — I6529 Occlusion and stenosis of unspecified carotid artery: Secondary | ICD-10-CM | POA: Insufficient documentation

## 2013-01-18 DIAGNOSIS — R5381 Other malaise: Secondary | ICD-10-CM | POA: Insufficient documentation

## 2013-01-18 DIAGNOSIS — E119 Type 2 diabetes mellitus without complications: Secondary | ICD-10-CM | POA: Insufficient documentation

## 2013-01-18 DIAGNOSIS — R0600 Dyspnea, unspecified: Secondary | ICD-10-CM

## 2013-01-18 DIAGNOSIS — I10 Essential (primary) hypertension: Secondary | ICD-10-CM | POA: Insufficient documentation

## 2013-01-18 MED ORDER — TECHNETIUM TC 99M SESTAMIBI GENERIC - CARDIOLITE
33.0000 | Freq: Once | INTRAVENOUS | Status: AC | PRN
  Administered 2013-01-18: 33 via INTRAVENOUS

## 2013-01-18 MED ORDER — REGADENOSON 0.4 MG/5ML IV SOLN
0.4000 mg | Freq: Once | INTRAVENOUS | Status: AC
  Administered 2013-01-18: 0.4 mg via INTRAVENOUS

## 2013-01-18 MED ORDER — TECHNETIUM TC 99M SESTAMIBI GENERIC - CARDIOLITE
10.8000 | Freq: Once | INTRAVENOUS | Status: AC | PRN
  Administered 2013-01-18: 11 via INTRAVENOUS

## 2013-01-18 NOTE — Progress Notes (Signed)
  [facility] SITE 3 NUCLEAR MED [address] [place], [place] [address] [phone]    Cardiology Nuclear Med Study  Beth Miller is a 72 y.o. female     MRN : [id number]     DOB: January 21, 1941  Procedure Date: 01/18/2013  Nuclear Med Background Indication for Stress Test:  Evaluation for Ischemia History:  Chronic Afib, 95s MVR, '96 MPS: Nl per pt '13 ECHO: 50-55% mild AR mod-severe TR Cardiac Risk Factors: Carotid Disease, Family History - CAD, History of Smoking, Hypertension and NIDDM  Symptoms:  Chest Pressure, DOE, Fatigue and Palpitations   Nuclear Pre-Procedure Caffeine/Decaff Intake:  None NPO After: 7:00pm   Lungs:  clear O2 Sat: 96% on room air. IV 0.9% NS with Angio Cath:  20g  IV Site: R Antecubital  IV Started by:  Cathlyn Parsons, RN  Chest Size (in):  40 Cup Size: D  Height: 5\' 2"  (1.575 m)  Weight:  170 lb (77.111 kg)  BMI:  Body mass index is 31.09 kg/(m^2). Tech Comments:  n/a    Nuclear Med Study 1 or 2 day study: 1 day  Stress Test Type:  Lexiscan  Reading MD: Charlton Haws, MD  Order Authorizing Provider:  Burna Cash and Sunday Spillers  Resting Radionuclide: Technetium 56m Sestamibi  Resting Radionuclide Dose: 10.8 mCi   Stress Radionuclide:  Technetium 50m Sestamibi  Stress Radionuclide Dose: 33.0 mCi           Stress Protocol Rest HR: 52 Stress HR: 84  Rest BP: 102/57 Stress BP: 107/64  Exercise Time (min): n/a METS: n/a   Predicted Max HR: 147 bpm % Max HR: 57.14 bpm Rate Pressure Product: 8988   Dose of Adenosine (mg):  n/a Dose of Lexiscan: 0.4 mg  Dose of Atropine (mg): n/a Dose of Dobutamine: n/a mcg/kg/min (at max HR)  Stress Test Technologist: Milana Na, EMT-P  Nuclear Technologist:  Domenic Polite, CNMT     Rest Procedure:  Myocardial perfusion imaging was performed at rest 45 minutes following the intravenous administration of Technetium 33m Sestamibi. Rest ECG: No acute changes and Afib with  nonspecific ST/T wave changes  Stress Procedure:  The patient received IV Lexiscan 0.4 mg over 15-seconds.  Technetium 68m Sestamibi injected at 30-seconds. This patient was sob, lt.  Headed, had a weird feeling, and had nausea with the Lexiscan infusion. Quantitative spect images were obtained after a 45 minute delay. Stress ECG: No significant change from baseline ECG  QPS Raw Data Images:  Patient motion noted. Stress Images:  Normal homogeneous uptake in all areas of the myocardium. Rest Images:  Normal homogeneous uptake in all areas of the myocardium. Subtraction (SDS):  Normal Transient Ischemic Dilatation (Normal <1.22):  1.17 Lung/Heart Ratio (Normal <0.45):  0.35  Quantitative Gated Spect Images QGS EDV:  n/a QGS ESV:  n/a  Impression Exercise Capacity:  Lexiscan with no exercise. BP Response:  Normal blood pressure response. Clinical Symptoms:  Nausea ECG Impression:  No significant ST segment change suggestive of ischemia. Comparison with Prior Nuclear Study: No images to compare  Overall Impression:  Normal stress nuclear study. Baseline rhythm afib  LV Ejection Fraction: Study not gated.  LV Wall Motion:  Study not gated   [employer]

## 2013-01-29 ENCOUNTER — Ambulatory Visit (INDEPENDENT_AMBULATORY_CARE_PROVIDER_SITE_OTHER): Payer: Medicare Other | Admitting: Cardiovascular Disease

## 2013-01-29 ENCOUNTER — Encounter: Payer: Self-pay | Admitting: Cardiovascular Disease

## 2013-01-29 VITALS — BP 110/70 | HR 70 | Ht 62.0 in | Wt 173.0 lb

## 2013-01-29 DIAGNOSIS — R079 Chest pain, unspecified: Secondary | ICD-10-CM

## 2013-01-29 DIAGNOSIS — Z9889 Other specified postprocedural states: Secondary | ICD-10-CM

## 2013-01-29 DIAGNOSIS — I4891 Unspecified atrial fibrillation: Secondary | ICD-10-CM

## 2013-01-29 DIAGNOSIS — I10 Essential (primary) hypertension: Secondary | ICD-10-CM

## 2013-01-29 NOTE — Assessment & Plan Note (Signed)
Well controlled.  Continue current medications and low sodium Dash type diet.    

## 2013-01-29 NOTE — Assessment & Plan Note (Signed)
Good rate control and anticoagulation Given old MVR and afib needs lovenox bridging if coumadin stopped

## 2013-01-29 NOTE — Assessment & Plan Note (Signed)
Normal function by recent echo SBE prophylaxis Continue coumadin

## 2013-01-29 NOTE — Assessment & Plan Note (Signed)
Resolved Normal myovue 01/18/13  Appears to have been noncardiac

## 2013-01-29 NOTE — Patient Instructions (Addendum)
Your physician wants you to follow-up in: YEAR WITH DR NISHAN  You will receive a reminder letter in the mail two months in advance. If you don't receive a letter, please call our office to schedule the follow-up appointment.  Your physician recommends that you continue on your current medications as directed. Please refer to the Current Medication list given to you today. 

## 2013-01-29 NOTE — Progress Notes (Signed)
Patient ID: Beth Miller, female   DOB: 1940/11/18, 72 y.o.   MRN: [id number] Beth Miller is seen for f/u MVR  Not seen here since January of 2013. Has multiple issues which include a prosthetic MV - dating back to 1995, chronic atrial fib, on coumadin and carotid disease. She is obese. Past smoker and she is diabetic. Other issues are as noted below. Last echo 01/13/13 reviewed  Her EF is normal. 55% MVR normal with moderate L/a?E  She comes in today. She is here alone. She has multiple complaints. She notes that she has not felt well for the last couple of months. Her energy level has dropped. She has been more short of breath. Over the past few weeks she has had some "heaviness" under the left breast - does not seem to be exertional in nature. Can last for a few minutes at a time. She tries to stay active - bowls twice a week but does not endorse a regular exercise program. Her weight is down 13 pounds. She has had a couple of bouts of nausea with diarrhea over the past year - saw GI and had a basically normal colonoscopy. Used Librarian, academic and Immodium with her last spell about 2 weeks ago. Does not know what her sugars are - does not check. No smoking. Has cramps - was changed from Lasix/Potassium to Aldactone and she notes no improvement. She has actually missed numerous doses of the Aldactone. She does note some chills, sweats and back pain. She will have some occasional palpitations. She is worried that it is time for valve replacement again.   F/U myovue done 01/18/13 normal no gating due to afib  Reviewed her echo and myovue results with her She seems reassured  ROS: Denies fever, malais, weight loss, blurry vision, decreased visual acuity, cough, sputum, SOB, hemoptysis, pleuritic pain, palpitaitons, heartburn, abdominal pain, melena, lower extremity edema, claudication, or rash.  All other systems reviewed and negative  General: Affect appropriate Healthy:  appears stated age HEENT: normal Neck  supple with no adenopathy JVP normal no bruits no thyromegaly Lungs clear with no wheezing and good diaphragmatic motion Heart:  S1 click form mechanical MVR /S2 no murmur, no rub, gallop or click PMI normal Abdomen: benighn, BS positve, no tenderness, no AAA no bruit.  No HSM or HJR Distal pulses intact with no bruits No edema Neuro non-focal Skin warm and dry No muscular weakness   Current Outpatient Prescriptions  Medication Sig Dispense Refill  . amoxicillin (AMOXIL) 500 MG tablet Take four tablets one hour prior to dental procedure  4 tablet  6  . Cholecalciferol (VITAMIN D3) 1000 UNIT tablet Take 1,000 Units by mouth daily.        Marland Kitchen CLEVER CHEK AUTO-CODE VOICE test strip USE 1 TIME DAILY FOR DIABETIC TESTING  100 each  3  . Diclofenac Sodium (PENNSAID) 1.5 % SOLN Place 3-5 drops onto the skin 3 (three) times daily. For pain       . digoxin (LANOXIN) 0.25 MG tablet Take 1 tablet (250 mcg total) by mouth daily.  30 tablet  11  . diltiazem (CARDIZEM CD) 240 MG 24 hr capsule Take 1 capsule (240 mg total) by mouth daily.  30 capsule  11  . fluticasone (FLONASE) 50 MCG/ACT nasal spray Place 2 sprays into the nose daily.  16 g  6  . furosemide (LASIX) 20 MG tablet 1/2 tab every other day      . glucosamine-chondroitin 500-400 MG tablet Take  1 tablet by mouth daily.        Marland Kitchen KLOR-CON M20 20 MEQ tablet Take 1 tablet by mouth daily.      . Lancets MISC USE 1 TIME DAILY FOR DIABETIC TESTING  100 each  3  . loratadine (CLARITIN) 10 MG tablet Take 10 mg by mouth daily. As needed for allergies       . metaxalone (SKELAXIN) 800 MG tablet Take 800 mg by mouth as needed.       . Omega-3 Fatty Acids (FISH OIL) 1000 MG CAPS Take 1 capsule by mouth daily.        Marland Kitchen omeprazole (PRILOSEC) 10 MG capsule Take 1 capsule (10 mg total) by mouth daily.  30 capsule  11  . Probiotic Product (ALIGN) 4 MG CAPS Take 1 capsule by mouth as needed.      . promethazine (PHENERGAN) 25 MG tablet Take 25 mg by mouth  every 6 (six) hours as needed.      . warfarin (COUMADIN) 10 MG tablet Take 1 tablet (10 mg total) by mouth as directed. As directed by Anticoagulation Clinic  30 tablet  11  . warfarin (COUMADIN) 5 MG tablet Take 0.5-1 tablets (2.5-5 mg total) by mouth as directed.  30 tablet  11   No current facility-administered medications for this visit.    Allergies  Food; Quinine; Spironolactone; and Sulfadiazine  Electrocardiogram:  Assessment and Plan

## 2013-02-03 ENCOUNTER — Ambulatory Visit (INDEPENDENT_AMBULATORY_CARE_PROVIDER_SITE_OTHER): Payer: Medicare Other | Admitting: General Practice

## 2013-02-03 DIAGNOSIS — Z9889 Other specified postprocedural states: Secondary | ICD-10-CM

## 2013-02-03 DIAGNOSIS — I4891 Unspecified atrial fibrillation: Secondary | ICD-10-CM

## 2013-02-03 DIAGNOSIS — Z7901 Long term (current) use of anticoagulants: Secondary | ICD-10-CM

## 2013-02-08 ENCOUNTER — Other Ambulatory Visit: Payer: Self-pay | Admitting: Internal Medicine

## 2013-03-01 ENCOUNTER — Telehealth: Payer: Self-pay | Admitting: *Deleted

## 2013-03-01 NOTE — Telephone Encounter (Signed)
Digox PA is approved until 02/24/14. Pharmacy informed.

## 2013-03-03 ENCOUNTER — Ambulatory Visit (INDEPENDENT_AMBULATORY_CARE_PROVIDER_SITE_OTHER): Payer: Medicare Other | Admitting: General Practice

## 2013-03-03 DIAGNOSIS — Z7901 Long term (current) use of anticoagulants: Secondary | ICD-10-CM

## 2013-03-03 DIAGNOSIS — I4891 Unspecified atrial fibrillation: Secondary | ICD-10-CM

## 2013-03-03 DIAGNOSIS — Z9889 Other specified postprocedural states: Secondary | ICD-10-CM

## 2013-03-06 ENCOUNTER — Ambulatory Visit (INDEPENDENT_AMBULATORY_CARE_PROVIDER_SITE_OTHER): Payer: Medicare Other | Admitting: Family Medicine

## 2013-03-06 VITALS — BP 122/74 | HR 78 | Temp 97.7°F | Ht 61.0 in | Wt 174.0 lb

## 2013-03-06 DIAGNOSIS — M546 Pain in thoracic spine: Secondary | ICD-10-CM

## 2013-03-06 DIAGNOSIS — M549 Dorsalgia, unspecified: Secondary | ICD-10-CM

## 2013-03-06 MED ORDER — CYCLOBENZAPRINE HCL 5 MG PO TABS: 5.0000 mg | ORAL_TABLET | Freq: Three times a day (TID) | ORAL | Status: DC | PRN

## 2013-03-06 NOTE — Progress Notes (Signed)
  Subjective:    Patient ID: Beth Miller, female    DOB: 1941-08-04, 72 y.o.   MRN: [id number]  HPI Acute visit Saturday clinic for left-sided neck and upper back pain. Onset about 6 days ago. No history of injury. She woke up last Monday with some soreness left side of neck. Denies radiculopathy symptoms. Pain is mild to moderate and more for achy sensation. Pain is worse with movement. She's tried heat and ibuprofen with mild relief. Took Skelaxin but had nausea has previously taken Flexeril without difficulty Tried topical icy hot without relief  Has history of mitral valve replacement and takes chronic Coumadin Past Medical History  Diagnosis Date  . Hypertension   . Diabetes mellitus   . GERD (gastroesophageal reflux disease)   . AF (atrial fibrillation)   . Chronic anticoagulation   . Chest pain   . Hematoma   . Diverticulosis   . Paresthesia   . Cataract, senile   . Disorder of oral soft tissue     of mouth  . Leg pain   . Sweating   . ALLERGIC RHINITIS   . Low back pain   . Diabetes mellitus   . Status post mitral valve replacement     St. Jude valve  . Vitamin D deficiency   . Blood transfusion without reported diagnosis   . CHF (congestive heart failure)   . Rheumatic fever   . Stroke     tia   Past Surgical History  Procedure Laterality Date  . Abdominal hysterectomy    . Cholecystectomy    . Cardiac valve replacement  1995    Mitral valve prosthesis; [facility]    reports that she quit smoking about 8 years ago. She has never used smokeless tobacco. She reports that she does not drink alcohol or use illicit drugs. family history includes Diabetes in her brother and mother; Heart disease in her brother; Hypertension in an unspecified family member; Other in her mother; Pancreatic cancer in her brother; and Seizures in her mother.  There is no history of Colon cancer. Allergies  Allergen Reactions  . Food     Optometrist  . Quinine   .  Spironolactone     constipation  . Sulfadiazine       Review of Systems  Constitutional: Negative for fever and chills.  Respiratory: Negative for shortness of breath.   Cardiovascular: Negative for chest pain.  Neurological: Negative for weakness, numbness and headaches.  Hematological: Negative for adenopathy.       Objective:   Physical Exam  Constitutional: She appears well-developed and well-nourished.  Cardiovascular: Normal rate.   Pulmonary/Chest: Effort normal and breath sounds normal. No respiratory distress. She has no wheezes. She has no rales.  Musculoskeletal:  No spinal tenderness. Patient has slightly limited range of motion with lateral bending and rotation secondary to pain. She has some diffuse left trapezius muscle tenderness and increased palpable tension  Neurological:  Full-strength upper extremities. Symmetric upper extremity reflexes. Normal sensory function.          Assessment & Plan:  Upper back and left cervical muscular tenderness. Suspect she has some muscle spasm. Continue moist heat. Cautious use of Flexeril 5 mg every 8 hours as needed with caution about sedation. Also recommend muscle massage. We've cautioned about avoiding regular use of ibuprofen with Coumadin. Consider physical therapy if no better over the next week

## 2013-03-06 NOTE — Patient Instructions (Signed)
Continue moist heat Continue topical rubs such as icy hot or bio freeze Consider muscle massage Use Flexeril with caution as this may cause some sedation. We need to consider physical therapy if not improving over the next week or 2

## 2013-03-25 ENCOUNTER — Emergency Department (HOSPITAL_COMMUNITY): Payer: No Typology Code available for payment source

## 2013-03-25 ENCOUNTER — Emergency Department (HOSPITAL_COMMUNITY)
Admission: EM | Admit: 2013-03-25 | Discharge: 2013-03-25 | Disposition: A | Discharge status: Home / Self Care | Payer: No Typology Code available for payment source | Attending: Emergency Medicine | Admitting: Emergency Medicine

## 2013-03-25 DIAGNOSIS — Z791 Long term (current) use of non-steroidal anti-inflammatories (NSAID): Secondary | ICD-10-CM | POA: Insufficient documentation

## 2013-03-25 DIAGNOSIS — Y92410 Unspecified street and highway as the place of occurrence of the external cause: Secondary | ICD-10-CM | POA: Insufficient documentation

## 2013-03-25 DIAGNOSIS — Z87828 Personal history of other (healed) physical injury and trauma: Secondary | ICD-10-CM | POA: Insufficient documentation

## 2013-03-25 DIAGNOSIS — Z8673 Personal history of transient ischemic attack (TIA), and cerebral infarction without residual deficits: Secondary | ICD-10-CM | POA: Insufficient documentation

## 2013-03-25 DIAGNOSIS — I509 Heart failure, unspecified: Secondary | ICD-10-CM | POA: Insufficient documentation

## 2013-03-25 DIAGNOSIS — E041 Nontoxic single thyroid nodule: Secondary | ICD-10-CM | POA: Insufficient documentation

## 2013-03-25 DIAGNOSIS — Z8669 Personal history of other diseases of the nervous system and sense organs: Secondary | ICD-10-CM | POA: Insufficient documentation

## 2013-03-25 DIAGNOSIS — I4891 Unspecified atrial fibrillation: Secondary | ICD-10-CM | POA: Insufficient documentation

## 2013-03-25 DIAGNOSIS — E119 Type 2 diabetes mellitus without complications: Secondary | ICD-10-CM | POA: Insufficient documentation

## 2013-03-25 DIAGNOSIS — K219 Gastro-esophageal reflux disease without esophagitis: Secondary | ICD-10-CM | POA: Insufficient documentation

## 2013-03-25 DIAGNOSIS — Z87891 Personal history of nicotine dependence: Secondary | ICD-10-CM | POA: Insufficient documentation

## 2013-03-25 DIAGNOSIS — T148XXA Other injury of unspecified body region, initial encounter: Secondary | ICD-10-CM

## 2013-03-25 DIAGNOSIS — Y9389 Activity, other specified: Secondary | ICD-10-CM | POA: Insufficient documentation

## 2013-03-25 DIAGNOSIS — S60032A Contusion of left middle finger without damage to nail, initial encounter: Secondary | ICD-10-CM

## 2013-03-25 DIAGNOSIS — Z8679 Personal history of other diseases of the circulatory system: Secondary | ICD-10-CM | POA: Insufficient documentation

## 2013-03-25 DIAGNOSIS — Z7901 Long term (current) use of anticoagulants: Secondary | ICD-10-CM | POA: Insufficient documentation

## 2013-03-25 DIAGNOSIS — S6000XA Contusion of unspecified finger without damage to nail, initial encounter: Secondary | ICD-10-CM | POA: Insufficient documentation

## 2013-03-25 DIAGNOSIS — Z872 Personal history of diseases of the skin and subcutaneous tissue: Secondary | ICD-10-CM | POA: Insufficient documentation

## 2013-03-25 DIAGNOSIS — S60042A Contusion of left ring finger without damage to nail, initial encounter: Secondary | ICD-10-CM

## 2013-03-25 DIAGNOSIS — Z954 Presence of other heart-valve replacement: Secondary | ICD-10-CM | POA: Insufficient documentation

## 2013-03-25 DIAGNOSIS — I10 Essential (primary) hypertension: Secondary | ICD-10-CM | POA: Insufficient documentation

## 2013-03-25 DIAGNOSIS — Z79899 Other long term (current) drug therapy: Secondary | ICD-10-CM | POA: Insufficient documentation

## 2013-03-25 DIAGNOSIS — IMO0002 Reserved for concepts with insufficient information to code with codable children: Secondary | ICD-10-CM | POA: Insufficient documentation

## 2013-03-25 DIAGNOSIS — Z8719 Personal history of other diseases of the digestive system: Secondary | ICD-10-CM | POA: Insufficient documentation

## 2013-03-25 DIAGNOSIS — Z8739 Personal history of other diseases of the musculoskeletal system and connective tissue: Secondary | ICD-10-CM | POA: Insufficient documentation

## 2013-03-25 MED ORDER — ACETAMINOPHEN 325 MG PO TABS
650.0000 mg | ORAL_TABLET | Freq: Once | ORAL | Status: AC
  Administered 2013-03-25: 650 mg via ORAL
  Filled 2013-03-25: qty 2

## 2013-03-25 MED ORDER — OXYCODONE-ACETAMINOPHEN 5-325 MG PO TABS: 1.0000 | ORAL_TABLET | ORAL | Status: DC | PRN
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
## 2013-04-01 ENCOUNTER — Encounter: Payer: Self-pay | Admitting: Internal Medicine

## 2013-04-01 ENCOUNTER — Ambulatory Visit (INDEPENDENT_AMBULATORY_CARE_PROVIDER_SITE_OTHER): Payer: Medicare Other | Admitting: Internal Medicine

## 2013-04-01 VITALS — BP 118/78 | HR 72 | Temp 97.9°F | Resp 16 | Wt 179.0 lb

## 2013-04-01 DIAGNOSIS — T148XXA Other injury of unspecified body region, initial encounter: Secondary | ICD-10-CM

## 2013-04-01 DIAGNOSIS — M79609 Pain in unspecified limb: Secondary | ICD-10-CM

## 2013-04-01 DIAGNOSIS — M542 Cervicalgia: Secondary | ICD-10-CM

## 2013-04-01 DIAGNOSIS — M79601 Pain in right arm: Secondary | ICD-10-CM | POA: Insufficient documentation

## 2013-04-01 DIAGNOSIS — E041 Nontoxic single thyroid nodule: Secondary | ICD-10-CM

## 2013-04-01 DIAGNOSIS — E119 Type 2 diabetes mellitus without complications: Secondary | ICD-10-CM

## 2013-04-01 DIAGNOSIS — I10 Essential (primary) hypertension: Secondary | ICD-10-CM

## 2013-04-01 NOTE — Assessment & Plan Note (Signed)
CT 03/25/13 IMPRESSION:  1. Cervical spondylosis and degenerative disc disease with  suspected spurring causing osseous foraminal stenosis at C4-5, C5-  6, and C6-7. No acute cervical spine findings.  2. Hypodense left thyroid lesion. Consider further evaluation  with thyroid ultrasound. If patient is clinically hyperthyroid,  consider nuclear medicine thyroid uptake and scan.  Original Report Authenticated By: Gaylyn Rong, M.D.   [place]

## 2013-04-01 NOTE — Assessment & Plan Note (Signed)
Continue with current prescription therapy as reflected on the Med list.  

## 2013-04-01 NOTE — Assessment & Plan Note (Signed)
Will observe 

## 2013-04-01 NOTE — Assessment & Plan Note (Signed)
She had a CT

## 2013-04-01 NOTE — Assessment & Plan Note (Signed)
Neck strain Contusions of B UEs and chest
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
## 2013-04-05 ENCOUNTER — Ambulatory Visit
Admission: RE | Admit: 2013-04-05 | Discharge: 2013-04-05 | Disposition: A | Discharge status: Home / Self Care | Payer: Medicare Other | Source: Ambulatory Visit | Attending: Internal Medicine | Admitting: Internal Medicine

## 2013-04-06 ENCOUNTER — Other Ambulatory Visit: Payer: Self-pay | Admitting: Internal Medicine

## 2013-04-06 DIAGNOSIS — E041 Nontoxic single thyroid nodule: Secondary | ICD-10-CM

## 2013-04-07 ENCOUNTER — Ambulatory Visit (INDEPENDENT_AMBULATORY_CARE_PROVIDER_SITE_OTHER): Payer: Medicare Other | Admitting: Family Medicine

## 2013-04-07 DIAGNOSIS — I4891 Unspecified atrial fibrillation: Secondary | ICD-10-CM

## 2013-04-07 DIAGNOSIS — Z9889 Other specified postprocedural states: Secondary | ICD-10-CM

## 2013-04-07 DIAGNOSIS — Z7901 Long term (current) use of anticoagulants: Secondary | ICD-10-CM

## 2013-04-07 LAB — POCT INR: INR: 4

## 2013-04-09 ENCOUNTER — Ambulatory Visit (INDEPENDENT_AMBULATORY_CARE_PROVIDER_SITE_OTHER): Payer: Medicare Other | Admitting: Endocrinology

## 2013-04-09 ENCOUNTER — Other Ambulatory Visit (HOSPITAL_COMMUNITY)
Admission: RE | Admit: 2013-04-09 | Discharge: 2013-04-09 | Disposition: A | Discharge status: Home / Self Care | Payer: Medicare Other | Source: Ambulatory Visit | Attending: Endocrinology | Admitting: Endocrinology

## 2013-04-09 ENCOUNTER — Encounter: Payer: Self-pay | Admitting: Endocrinology

## 2013-04-09 VITALS — BP 122/72 | HR 110 | Ht 62.0 in | Wt 176.0 lb

## 2013-04-09 DIAGNOSIS — E041 Nontoxic single thyroid nodule: Secondary | ICD-10-CM
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
## 2013-04-09 NOTE — Patient Instructions (Addendum)
We'll contact you with the biopsy results. If no cancer is seen, Please come back for a follow-up appointment in 6 months.  Also, i would refer you to a specialist.

## 2013-04-12 ENCOUNTER — Other Ambulatory Visit: Payer: Self-pay | Admitting: Endocrinology

## 2013-04-15 ENCOUNTER — Ambulatory Visit: Payer: Medicare Other | Admitting: Internal Medicine

## 2013-04-19 ENCOUNTER — Ambulatory Visit: Payer: Medicare Other | Admitting: Internal Medicine

## 2013-04-19 ENCOUNTER — Other Ambulatory Visit: Payer: Self-pay | Admitting: Internal Medicine

## 2013-04-28 ENCOUNTER — Ambulatory Visit (INDEPENDENT_AMBULATORY_CARE_PROVIDER_SITE_OTHER): Payer: Medicare Other | Admitting: General Practice

## 2013-04-28 DIAGNOSIS — Z7901 Long term (current) use of anticoagulants: Secondary | ICD-10-CM

## 2013-04-28 DIAGNOSIS — Z9889 Other specified postprocedural states: Secondary | ICD-10-CM

## 2013-04-28 DIAGNOSIS — I4891 Unspecified atrial fibrillation: Secondary | ICD-10-CM

## 2013-04-28 LAB — POCT INR: INR: 2.6

## 2013-04-29 ENCOUNTER — Ambulatory Visit: Payer: Medicare Other | Admitting: Internal Medicine

## 2013-04-29 DIAGNOSIS — Z0289 Encounter for other administrative examinations: Secondary | ICD-10-CM

## 2013-05-19 ENCOUNTER — Ambulatory Visit (INDEPENDENT_AMBULATORY_CARE_PROVIDER_SITE_OTHER): Payer: Medicare Other | Admitting: Family Medicine

## 2013-05-19 DIAGNOSIS — Z9889 Other specified postprocedural states: Secondary | ICD-10-CM

## 2013-05-19 DIAGNOSIS — I4891 Unspecified atrial fibrillation: Secondary | ICD-10-CM

## 2013-05-19 DIAGNOSIS — Z7901 Long term (current) use of anticoagulants: Secondary | ICD-10-CM

## 2013-05-31 ENCOUNTER — Other Ambulatory Visit: Payer: Self-pay | Admitting: Internal Medicine

## 2013-06-11 ENCOUNTER — Other Ambulatory Visit: Payer: Self-pay | Admitting: Internal Medicine

## 2013-06-21 ENCOUNTER — Other Ambulatory Visit: Payer: Self-pay | Admitting: Internal Medicine

## 2013-07-01 ENCOUNTER — Other Ambulatory Visit (INDEPENDENT_AMBULATORY_CARE_PROVIDER_SITE_OTHER): Payer: Medicare Other

## 2013-07-01 ENCOUNTER — Ambulatory Visit (INDEPENDENT_AMBULATORY_CARE_PROVIDER_SITE_OTHER): Payer: Medicare Other | Admitting: General Practice

## 2013-07-01 DIAGNOSIS — Z7901 Long term (current) use of anticoagulants: Secondary | ICD-10-CM

## 2013-07-01 DIAGNOSIS — E119 Type 2 diabetes mellitus without complications: Secondary | ICD-10-CM

## 2013-07-01 DIAGNOSIS — D649 Anemia, unspecified: Secondary | ICD-10-CM

## 2013-07-01 DIAGNOSIS — I4891 Unspecified atrial fibrillation: Secondary | ICD-10-CM

## 2013-07-01 DIAGNOSIS — Z9889 Other specified postprocedural states: Secondary | ICD-10-CM

## 2013-07-01 LAB — BASIC METABOLIC PANEL
Chloride: 104 mEq/L (ref 96–112)
Potassium: 4 mEq/L (ref 3.5–5.1)
Sodium: 138 mEq/L (ref 135–145)

## 2013-07-01 LAB — IBC PANEL
Iron: 46 ug/dL (ref 42–145)
Transferrin: 279.1 mg/dL (ref 212.0–360.0)

## 2013-07-01 LAB — CBC WITH DIFFERENTIAL/PLATELET
Basophils Absolute: 0 10*3/uL (ref 0.0–0.1)
Basophils Relative: 1 % (ref 0.0–3.0)
Eosinophils Absolute: 0.1 10*3/uL (ref 0.0–0.7)
Lymphocytes Relative: 31.7 % (ref 12.0–46.0)
MCHC: 33.3 g/dL (ref 30.0–36.0)
Neutrophils Relative %: 51.7 % (ref 43.0–77.0)
RBC: 4.27 Mil/uL (ref 3.87–5.11)
RDW: 16.1 % — ABNORMAL HIGH (ref 11.5–14.6)

## 2013-07-01 LAB — PROTIME-INR
INR: 2.2 ratio — ABNORMAL HIGH (ref 0.8–1.0)
Prothrombin Time: 23.2 s — ABNORMAL HIGH (ref 10.2–12.4)

## 2013-07-13 ENCOUNTER — Ambulatory Visit: Payer: Medicare Other | Admitting: Internal Medicine

## 2013-07-13 DIAGNOSIS — Z0289 Encounter for other administrative examinations: Secondary | ICD-10-CM

## 2013-07-20 ENCOUNTER — Encounter: Payer: Self-pay | Admitting: Internal Medicine

## 2013-07-20 ENCOUNTER — Ambulatory Visit (INDEPENDENT_AMBULATORY_CARE_PROVIDER_SITE_OTHER): Payer: Medicare Other | Admitting: Internal Medicine

## 2013-07-20 VITALS — BP 140/86 | HR 80 | Temp 97.8°F | Resp 16 | Wt 180.0 lb

## 2013-07-20 DIAGNOSIS — I10 Essential (primary) hypertension: Secondary | ICD-10-CM

## 2013-07-20 DIAGNOSIS — G4733 Obstructive sleep apnea (adult) (pediatric): Secondary | ICD-10-CM | POA: Insufficient documentation

## 2013-07-20 DIAGNOSIS — R413 Other amnesia: Secondary | ICD-10-CM

## 2013-07-20 DIAGNOSIS — E119 Type 2 diabetes mellitus without complications: Secondary | ICD-10-CM

## 2013-07-20 DIAGNOSIS — Z7901 Long term (current) use of anticoagulants: Secondary | ICD-10-CM

## 2013-07-20 DIAGNOSIS — Z23 Encounter for immunization: Secondary | ICD-10-CM

## 2013-07-20 MED ORDER — CHLORTHALIDONE 25 MG PO TABS: 12.5000 mg | ORAL_TABLET | Freq: Every day | ORAL | Status: DC | PRN

## 2013-07-20 MED ORDER — OMEPRAZOLE 10 MG PO CPDR: 10.0000 mg | DELAYED_RELEASE_CAPSULE | Freq: Every day | ORAL | Status: DC

## 2013-07-20 MED ORDER — DILTIAZEM HCL ER COATED BEADS 240 MG PO CP24: ORAL_CAPSULE | ORAL | Status: DC

## 2013-07-20 MED ORDER — CYCLOBENZAPRINE HCL 5 MG PO TABS: 5.0000 mg | ORAL_TABLET | Freq: Three times a day (TID) | ORAL | Status: DC | PRN

## 2013-07-20 MED ORDER — FLUTICASONE PROPIONATE 50 MCG/ACT NA SUSP: 2.0000 | Freq: Every day | NASAL | Status: DC

## 2013-07-20 NOTE — Assessment & Plan Note (Signed)
Continue with current prescription therapy as reflected on the Med list.  

## 2013-07-20 NOTE — Progress Notes (Signed)
   Subjective:    HPI  C/o memory issues, forgetfull  The patient presents for a follow-up of  chronic hypertension, chronic dyslipidemia, anticoagulation, controlled with medicines. F/u OA, R knee pain  Wt Readings from Last 3 Encounters:  07/20/13 180 lb (81.647 kg)  04/09/13 176 lb (79.833 kg)  04/01/13 179 lb (81.194 kg)   BP Readings from Last 3 Encounters:  07/20/13 140/86  04/09/13 122/72  04/01/13 118/78     Review of Systems  Constitutional: Negative for chills, activity change, appetite change, fatigue and unexpected weight change.  HENT: Negative for congestion, mouth sores and sinus pressure.   Eyes: Negative for visual disturbance.  Respiratory: Negative for cough and chest tightness.   Gastrointestinal: Negative for nausea and abdominal pain.  Genitourinary: Negative for frequency, difficulty urinating and vaginal pain.  Musculoskeletal: Positive for arthralgias (R knee). Negative for back pain and gait problem.  Skin: Negative for pallor and rash.  Neurological: Negative for dizziness, tremors, weakness, numbness and headaches.  Psychiatric/Behavioral: Negative for confusion and sleep disturbance.       Objective:   Physical Exam  Constitutional: She appears well-developed. No distress.  obese  HENT:  Head: Normocephalic.  Right Ear: External ear normal.  Left Ear: External ear normal.  Nose: Nose normal.  Mouth/Throat: Oropharynx is clear and moist.  Eyes: Conjunctivae are normal. Pupils are equal, round, and reactive to light. Right eye exhibits no discharge. Left eye exhibits no discharge.  Neck: Normal range of motion. Neck supple. No JVD present. No tracheal deviation present. No thyromegaly present.  Cardiovascular: Normal rate and normal heart sounds.   Irregular  Pulmonary/Chest: No stridor. No respiratory distress. She has no wheezes.  Abdominal: Soft. Bowel sounds are normal. She exhibits no distension and no mass. There is no tenderness.  There is no rebound and no guarding.  Musculoskeletal: She exhibits tenderness (R knee w/mild tenderness). She exhibits no edema.  Lymphadenopathy:    She has no cervical adenopathy.  Neurological: She displays normal reflexes. No cranial nerve deficit. She exhibits normal muscle tone. Coordination normal.  Skin: No rash noted. No erythema.  Psychiatric: She has a normal mood and affect. Her behavior is normal. Judgment and thought content normal.   Lab Results  Component Value Date   WBC 4.5 07/01/2013   HGB 11.9* 07/01/2013   HCT 35.9* 07/01/2013   PLT 239.0 07/01/2013   GLUCOSE 109* 07/01/2013   CHOL 123 06/01/2012   TRIG 121.0 06/01/2012   HDL 45.10 06/01/2012   LDLCALC 54 06/01/2012   ALT 16 06/01/2012   AST 25 06/01/2012   NA 138 07/01/2013   K 4.0 07/01/2013   CL 104 07/01/2013   CREATININE 0.8 07/01/2013   BUN 10 07/01/2013   CO2 29 07/01/2013   TSH 1.71 01/08/2013   INR 2.1 07/01/2013   HGBA1C 5.9 06/01/2012          Assessment & Plan:

## 2013-07-20 NOTE — Assessment & Plan Note (Signed)
Pulm ref to re-start CPAP

## 2013-07-20 NOTE — Assessment & Plan Note (Signed)
9/14 multifactorial - pos OSA related Treat OSA

## 2013-08-27 ENCOUNTER — Ambulatory Visit (INDEPENDENT_AMBULATORY_CARE_PROVIDER_SITE_OTHER): Payer: Medicare Other | Admitting: Pulmonary Disease

## 2013-08-27 ENCOUNTER — Encounter: Payer: Self-pay | Admitting: Pulmonary Disease

## 2013-08-27 ENCOUNTER — Ambulatory Visit (INDEPENDENT_AMBULATORY_CARE_PROVIDER_SITE_OTHER): Payer: Medicare Other | Admitting: General Practice

## 2013-08-27 VITALS — BP 122/76 | HR 76 | Temp 97.9°F | Ht 62.0 in | Wt 184.4 lb

## 2013-08-27 DIAGNOSIS — G4733 Obstructive sleep apnea (adult) (pediatric): Secondary | ICD-10-CM

## 2013-08-27 DIAGNOSIS — Z7901 Long term (current) use of anticoagulants: Secondary | ICD-10-CM

## 2013-08-27 DIAGNOSIS — Z9889 Other specified postprocedural states: Secondary | ICD-10-CM

## 2013-08-27 DIAGNOSIS — I4891 Unspecified atrial fibrillation: Secondary | ICD-10-CM

## 2013-08-27 NOTE — Assessment & Plan Note (Addendum)
We discussed benefits of CPAp on memory & atrial fibrillation CPAP titration- trial of nasal mask or pillows Following this we will start her on auto CPAP to improve  tolerance  Weight loss encouraged, compliance with goal of at least 4-6 hrs every night is the expectation. Advised against medications with sedative side effects Cautioned against driving when sleepy - understanding that sleepiness will vary on a day to day basis

## 2013-08-27 NOTE — Patient Instructions (Signed)
We discussed benefits of CPAp on memory & atrial fibrillation CPAP titration- trial of nasal mask or pillows

## 2013-08-27 NOTE — Progress Notes (Signed)
  Subjective:    Patient ID: Beth Miller, female    DOB: [birth year], 71 y.o.   MRN: [id number]  HPI  10/05 - 184 lbs - RDI 50/h  4/02 -169lbs - RDI 19/h- corrected by CPAP 14  Review of Systems     Objective:   Physical Exam        Assessment & Plan:

## 2013-08-27 NOTE — Progress Notes (Signed)
Subjective:    Patient ID: Beth Miller, female    DOB: [birth year], 72 y.o.   MRN: [id number]  HPI 72 year old smoker referred for management of obstructive sleep apnea She has been having increased lapses of memory recently. Epworth sleepiness score is 18 / 24  Overnight polysomnogram in 10/05 - 184 lbs -  showed  RDI 50/h , Lowest desaturation of 83%  4/02 -169lbs - RDI 19/h- corrected by CPAP 14  Bedtime is around midnight, latency is minimal, no nocturia, out of bed at 8 AM with dryness of mouth no headaches. There is no history suggestive of cataplexy, sleep paralysis or parasomnias She is chronic atrial fibrillation that is rate controlled on Cardizem, and she is maintained on anticoagulation.     Past Medical History  Diagnosis Date  . Hypertension   . Diabetes mellitus   . GERD (gastroesophageal reflux disease)   . AF (atrial fibrillation)   . Chronic anticoagulation   . Chest pain   . Hematoma   . Diverticulosis   . Paresthesia   . Cataract, senile   . Disorder of oral soft tissue     of mouth  . Leg pain   . Sweating   . ALLERGIC RHINITIS   . Low back pain   . Diabetes mellitus   . Status post mitral valve replacement     St. Jude valve  . Vitamin D deficiency   . Blood transfusion without reported diagnosis   . CHF (congestive heart failure)   . Rheumatic fever   . Stroke     tia    Past Surgical History  Procedure Laterality Date  . Abdominal hysterectomy    . Cholecystectomy    . Cardiac valve replacement  1995    Mitral valve prosthesis; [facility]    Allergies  Allergen Reactions  . Food     Optometrist  . Furosemide     cramps  . Quinine Other (See Comments)    Can't hear  . Spironolactone     constipation  . Sulfadiazine Itching and Rash    History   Social History  . Marital Status: Married    Spouse Name: N/A    Number of Children: 1  . Years of Education: N/A   Occupational History  . Retired    Social History  Main Topics  . Smoking status: Former Smoker -- 0.02 packs/day for 60 years    Types: Cigarettes    Quit date: 10/28/2002  . Smokeless tobacco: Never Used  . Alcohol Use: No  . Drug Use: No  . Sexual Activity: Yes   Other Topics Concern  . Not on file   Social History Narrative   Negative Family History of Colon Cancer      Regular exercise-yes, bowling      Daily caffeine Use-rare     Review of Systems  Constitutional: Negative for fever and unexpected weight change.  HENT: Negative for congestion, dental problem, ear pain, nosebleeds, postnasal drip, rhinorrhea, sinus pressure, sneezing, sore throat and trouble swallowing.   Eyes: Negative for redness and itching.  Respiratory: Negative for cough, chest tightness, shortness of breath and wheezing.   Cardiovascular: Negative for palpitations and leg swelling.  Gastrointestinal: Negative for nausea and vomiting.  Genitourinary: Negative for dysuria.  Musculoskeletal: Negative for joint swelling.  Skin: Negative for rash.  Neurological: Negative for headaches.  Hematological: Does not bruise/bleed easily.  Psychiatric/Behavioral: Negative for dysphoric mood. The patient is not nervous/anxious.  Objective:   Physical Exam  Gen. Pleasant, obese, in no distress, normal affect ENT - no lesions, no post nasal drip, class 2-3 airway Neck: No JVD, no thyromegaly, no carotid bruits Lungs: no use of accessory muscles, no dullness to percussion, decreased without rales or rhonchi  Cardiovascular: Rhythm regular, heart sounds  normal, no murmurs or gallops, no peripheral edema Abdomen: soft and non-tender, no hepatosplenomegaly, BS normal. Musculoskeletal: No deformities, no cyanosis or clubbing Neuro:  alert, non focal, no tremors        Assessment & Plan:

## 2013-08-31 ENCOUNTER — Ambulatory Visit (HOSPITAL_BASED_OUTPATIENT_CLINIC_OR_DEPARTMENT_OTHER): Payer: Medicare Other | Attending: Pulmonary Disease | Admitting: Radiology

## 2013-08-31 VITALS — Ht 62.0 in | Wt 180.0 lb

## 2013-08-31 DIAGNOSIS — I4891 Unspecified atrial fibrillation: Secondary | ICD-10-CM | POA: Insufficient documentation

## 2013-08-31 DIAGNOSIS — G4733 Obstructive sleep apnea (adult) (pediatric): Secondary | ICD-10-CM | POA: Insufficient documentation

## 2013-09-07 DIAGNOSIS — G4733 Obstructive sleep apnea (adult) (pediatric): Secondary | ICD-10-CM

## 2013-09-10 NOTE — Procedures (Signed)
NAME:  Beth Miller, Beth Miller NO.:  [id number]  MEDICAL RECORD NO.:  [id number]          PATIENT TYPE:  OUT  LOCATION:  SLEEP CENTER                 FACILITY[facility]  [facility]  PHYSICIAN:  Oretha Milch, MD      DATE OF BIRTH:  19-Aug-1941  DATE OF STUDY:  08/31/2013                           NOCTURNAL POLYSOMNOGRAM  REFERRING PHYSICIAN:  Oretha Milch, MD  INDICATION FOR STUDY:  Ms. Dyneshia is a 72 year old with obstructive sleep apnea.  A baseline study in April 2002 when she weighed 169 pounds showed an RDI of 19 events per hour, corrected by CPAP of 14 cm, appeared and other study in October, 2005, with weight at 184 pounds showed an RDI of 50 events per hour.  She now reports memory lapses and frequent nocturnal awakenings, and daytime somnolence and hence the titration study was ordered.  At the time of this study, she weighed 180 pounds with a height of 5 feet 2 inches, BMI of 33, neck size of 14 inches.  This CPAP titration study was performed with a sleep technologist in attendance.  EEG, EOG, EMG, EKG, and respiratory parameters were recorded.  Sleep stages, arousals, limb movements, and respiratory data were scored according to criteria laid out by the American Academy of Sleep Medicine.  EPWORTH SLEEPINESS SCORE:  Epworth sleepiness score of 18.  SLEEP ARCHITECTURE:  Lights out was at 10:23 p.m.  Lights on was at 4:56 a.m.  Total sleep time was 340 minutes with a sleep period time of 346 minutes and a sleep efficiency of 87%.  Sleep latency was 15 minutes and latency to REM sleep was 49 minutes and wake after sleep onset was 36 minutes.  Sleep stages of the percentage of total sleep time was N1 2%, N2 74.6%, N3 0%, and REM sleep 23% (79 minutes).  Supine sleep accounted for 243 minutes and supine REM sleep accounted for 49 minutes.  Longest period of REM sleep was around 2 a.m.  RESPIRATORY DATA:  CPAP was initiated at 5 cm with a small full-face mask and titrated  to a final level of 15 cm.  At this final level for 98 minutes including 6 minutes of REM sleep, 1 hypopnea was noted with a lowest desaturation of 93%.  This appears to be the optimal level used during the study.  AROUSAL DATA:  There was 66 arousals with an arousal index of 11 events per hour.  Most of these were spontaneous and the few related to respiratory events.  LIMB MOVEMENT DATA:  The limb movement index was 1.2 events per hour, however these were not associated with arousals.  OXYGEN DATA:  The desaturation index was 5 events per hour.  The lowest desaturation of 89%.  CARDIAC DATA:  The low heart rate was 40 beats per minute.  The high heart rate recorded was an artifact.  Few PVCs were noted.  Underlying rhythm was atrial fibrillation.  DISCUSSION:  She tolerated CPAP well.  Titration was optimal with supine REM sleep noted.  MOVEMENT-PARASOMNIA:none noted  IMPRESSIONS: 1. Moderate obstructive sleep apnea with hypopneas causing sleep     fragmentation and oxygen desaturation. 2. This was corrected  by continuous positive airway pressure of 15 cm     with a small full-face mask.  Titration was optimal. 3. No evidence of cardiac arrhythmias, significant limb movements, or     behavioral disturbance during sleep.  RECOMMENDATION: 1. CPAP can be set at 15 cm with a small full-face mask and compliance     monitored at this level. 2. She should be asked to avoid medications with sedative side     effects.  She should be cautioned against driving when sleepy.     Other causes of somnolence such as medication can be investigated.     Oretha Milch, MD    RVA/MEDQ  D:  09/07/2013 11:08:38  T:  09/08/2013 04:30:09  Job:  [id number]

## 2013-09-13 ENCOUNTER — Telehealth: Payer: Self-pay | Admitting: Pulmonary Disease

## 2013-09-13 DIAGNOSIS — G4733 Obstructive sleep apnea (adult) (pediatric): Secondary | ICD-10-CM

## 2013-09-13 NOTE — Telephone Encounter (Signed)
Change CPAP to 15cm based on titration study

## 2013-09-14 NOTE — Telephone Encounter (Signed)
Fu with download

## 2013-09-14 NOTE — Telephone Encounter (Signed)
lmtcb x1 for pt to schedule appt for pt.

## 2013-09-14 NOTE — Telephone Encounter (Signed)
I spoke with patient about results and she verbalized understanding and had no questions. Pt does not have a CPAP. Do you want a download in 4 weeks Dr. Vassie Loll or does she needs a f/u? Please advise thanks

## 2013-09-15 ENCOUNTER — Telehealth: Payer: Self-pay | Admitting: Pulmonary Disease

## 2013-09-15 DIAGNOSIS — G4733 Obstructive sleep apnea (adult) (pediatric): Secondary | ICD-10-CM

## 2013-09-15 NOTE — Telephone Encounter (Signed)
I spoke with Melissa. She reports since pt has medicare and had break in therapy they are requesting pt have a home sleep study as pt just had a CPAP titration study done. Please advise Dr. Vassie Loll thanks

## 2013-09-15 NOTE — Telephone Encounter (Signed)
Pt aware and order placed.  

## 2013-09-15 NOTE — Telephone Encounter (Signed)
Pl let pt know OK for home study

## 2013-09-20 ENCOUNTER — Telehealth: Payer: Self-pay | Admitting: Pulmonary Disease

## 2013-09-20 ENCOUNTER — Ambulatory Visit (INDEPENDENT_AMBULATORY_CARE_PROVIDER_SITE_OTHER): Payer: Medicare Other | Admitting: General Practice

## 2013-09-20 DIAGNOSIS — Z9889 Other specified postprocedural states: Secondary | ICD-10-CM

## 2013-09-20 DIAGNOSIS — I4891 Unspecified atrial fibrillation: Secondary | ICD-10-CM

## 2013-09-20 DIAGNOSIS — Z7901 Long term (current) use of anticoagulants: Secondary | ICD-10-CM

## 2013-09-20 LAB — POCT INR: INR: 1.5

## 2013-09-20 NOTE — Patient Instructions (Signed)
Pre-visit discussion using our clinic review tool. No additional management support is needed unless otherwise documented below in the visit note.  

## 2013-09-20 NOTE — Telephone Encounter (Signed)
Returned patient's call and after picking up the device today, she stated that she had to be at the church at 5:00 am in the morning. Advised patient not to put the device on, as this is not enough time on the device to make a determination. Advised patient to return the device in the morning and we will r/s another time for her to do the home study. Rhonda J Cobb

## 2013-10-07 DIAGNOSIS — G4733 Obstructive sleep apnea (adult) (pediatric): Secondary | ICD-10-CM

## 2013-10-11 ENCOUNTER — Ambulatory Visit (INDEPENDENT_AMBULATORY_CARE_PROVIDER_SITE_OTHER): Payer: Medicare Other | Admitting: General Practice

## 2013-10-11 ENCOUNTER — Other Ambulatory Visit: Payer: Self-pay

## 2013-10-11 DIAGNOSIS — I4891 Unspecified atrial fibrillation: Secondary | ICD-10-CM

## 2013-10-11 DIAGNOSIS — Z7901 Long term (current) use of anticoagulants: Secondary | ICD-10-CM

## 2013-10-11 DIAGNOSIS — Z1231 Encounter for screening mammogram for malignant neoplasm of breast: Secondary | ICD-10-CM

## 2013-10-11 DIAGNOSIS — Z9889 Other specified postprocedural states: Secondary | ICD-10-CM

## 2013-10-11 LAB — POCT INR: INR: 2.2

## 2013-10-11 NOTE — Progress Notes (Signed)
Pre-visit discussion using our clinic review tool. No additional management support is needed unless otherwise documented below in the visit note.  

## 2013-10-12 ENCOUNTER — Telehealth: Payer: Self-pay | Admitting: Pulmonary Disease

## 2013-10-12 NOTE — Telephone Encounter (Signed)
Home study showed moderate OSA -23/h PL ask [facility] to process CPAP - see earlier phone note

## 2013-10-14 DIAGNOSIS — G4733 Obstructive sleep apnea (adult) (pediatric): Secondary | ICD-10-CM

## 2013-10-14 NOTE — Telephone Encounter (Signed)
Message sent to Melissa 

## 2013-10-15 ENCOUNTER — Ambulatory Visit
Admission: RE | Admit: 2013-10-15 | Discharge: 2013-10-15 | Disposition: A | Discharge status: Home / Self Care | Payer: Medicare Other | Source: Ambulatory Visit | Attending: Endocrinology | Admitting: Endocrinology

## 2013-10-15 ENCOUNTER — Encounter: Payer: Self-pay | Admitting: Pulmonary Disease

## 2013-10-15 ENCOUNTER — Encounter: Payer: Self-pay | Admitting: Endocrinology

## 2013-10-15 ENCOUNTER — Ambulatory Visit (INDEPENDENT_AMBULATORY_CARE_PROVIDER_SITE_OTHER): Payer: Medicare Other | Admitting: Endocrinology

## 2013-10-15 VITALS — BP 118/78 | HR 86 | Temp 98.0°F | Ht 62.0 in | Wt 184.0 lb

## 2013-10-15 DIAGNOSIS — E041 Nontoxic single thyroid nodule: Secondary | ICD-10-CM
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
## 2013-10-15 NOTE — Patient Instructions (Addendum)
Let's recheck the ultrasound.  you will receive a phone call, about a day and time for an appointment If there is no significant change, please return in 1 year.

## 2013-10-18 ENCOUNTER — Ambulatory Visit: Payer: Medicare Other | Admitting: Internal Medicine

## 2013-10-20 ENCOUNTER — Telehealth: Payer: Self-pay | Admitting: Pulmonary Disease

## 2013-10-20 DIAGNOSIS — G4733 Obstructive sleep apnea (adult) (pediatric): Secondary | ICD-10-CM

## 2013-10-20 NOTE — Telephone Encounter (Signed)
Called spoke with patient and verified that d/t insurance change to [facility], she will need to change her DME company from [facility] to [place].  Pt stated she was to begin CPAP therapy thru [facility].  Order placed for DME switch, pt is aware that it may be a few days before she hears anything.  Order placed; will sign off.

## 2013-10-25 ENCOUNTER — Encounter: Payer: Self-pay | Admitting: Internal Medicine

## 2013-10-25 ENCOUNTER — Other Ambulatory Visit (INDEPENDENT_AMBULATORY_CARE_PROVIDER_SITE_OTHER): Payer: Medicare Other

## 2013-10-25 ENCOUNTER — Ambulatory Visit (INDEPENDENT_AMBULATORY_CARE_PROVIDER_SITE_OTHER): Payer: Medicare Other | Admitting: Internal Medicine

## 2013-10-25 VITALS — BP 110/60 | HR 72 | Temp 97.3°F | Resp 16 | Wt 183.0 lb

## 2013-10-25 DIAGNOSIS — I10 Essential (primary) hypertension: Secondary | ICD-10-CM

## 2013-10-25 DIAGNOSIS — R7309 Other abnormal glucose: Secondary | ICD-10-CM

## 2013-10-25 DIAGNOSIS — I4891 Unspecified atrial fibrillation: Secondary | ICD-10-CM

## 2013-10-25 DIAGNOSIS — D649 Anemia, unspecified: Secondary | ICD-10-CM

## 2013-10-25 DIAGNOSIS — M542 Cervicalgia: Secondary | ICD-10-CM

## 2013-10-25 DIAGNOSIS — E119 Type 2 diabetes mellitus without complications: Secondary | ICD-10-CM

## 2013-10-25 DIAGNOSIS — R739 Hyperglycemia, unspecified: Secondary | ICD-10-CM

## 2013-10-25 LAB — LIPID PANEL
Total CHOL/HDL Ratio: 3
Triglycerides: 134 mg/dL (ref 0.0–149.0)

## 2013-10-25 LAB — BASIC METABOLIC PANEL
Calcium: 9.2 mg/dL (ref 8.4–10.5)
Creatinine, Ser: 0.9 mg/dL (ref 0.4–1.2)
GFR: 74.82 mL/min (ref >60.00)
Glucose, Bld: 112 mg/dL — ABNORMAL HIGH (ref 70–99)
Potassium: 4.5 mEq/L (ref 3.5–5.1)
Sodium: 142 mEq/L (ref 135–145)

## 2013-10-25 LAB — HEPATIC FUNCTION PANEL
ALT: 15 U/L (ref 0–35)
Albumin: 4.1 g/dL (ref 3.5–5.2)
Bilirubin, Direct: 0 mg/dL (ref 0.0–0.3)
Total Protein: 7.5 g/dL (ref 6.0–8.3)

## 2013-10-25 LAB — TSH: TSH: 3.91 u[IU]/mL (ref 0.35–5.50)

## 2013-10-25 MED ORDER — AMOXICILLIN 500 MG PO TABS: ORAL_TABLET | ORAL | Status: DC

## 2013-10-25 NOTE — Assessment & Plan Note (Signed)
Contour pillow  

## 2013-10-25 NOTE — Assessment & Plan Note (Signed)
Continue with current prescription therapy as reflected on the Med list.  

## 2013-10-25 NOTE — Patient Instructions (Signed)
Contour pillow  

## 2013-10-25 NOTE — Progress Notes (Signed)
Pre visit review using our clinic review tool, if applicable. No additional management support is needed unless otherwise documented below in the visit note. 

## 2013-10-25 NOTE — Progress Notes (Signed)
   Subjective:    HPI  F/u memory issues, forgetful - better  The patient presents for a follow-up of  chronic hypertension, chronic dyslipidemia, anticoagulation, controlled with medicines. F/u OA, R knee pain  Wt Readings from Last 3 Encounters:  10/25/13 183 lb (83.008 kg)  10/15/13 184 lb (83.462 kg)  08/31/13 180 lb (81.647 kg)   BP Readings from Last 3 Encounters:  10/25/13 110/60  10/15/13 118/78  08/27/13 122/76     Review of Systems  Constitutional: Negative for chills, activity change, appetite change, fatigue and unexpected weight change.  HENT: Negative for congestion, mouth sores and sinus pressure.   Eyes: Negative for visual disturbance.  Respiratory: Negative for cough and chest tightness.   Gastrointestinal: Negative for nausea and abdominal pain.  Genitourinary: Negative for frequency, difficulty urinating and vaginal pain.  Musculoskeletal: Positive for arthralgias (R knee). Negative for back pain and gait problem.  Skin: Negative for pallor and rash.  Neurological: Negative for dizziness, tremors, weakness, numbness and headaches.  Psychiatric/Behavioral: Negative for confusion and sleep disturbance.       Objective:   Physical Exam  Constitutional: She appears well-developed. No distress.  obese  HENT:  Head: Normocephalic.  Right Ear: External ear normal.  Left Ear: External ear normal.  Nose: Nose normal.  Mouth/Throat: Oropharynx is clear and moist.  Eyes: Conjunctivae are normal. Pupils are equal, round, and reactive to light. Right eye exhibits no discharge. Left eye exhibits no discharge.  Neck: Normal range of motion. Neck supple. No JVD present. No tracheal deviation present. No thyromegaly present.  Cardiovascular: Normal heart sounds.   No murmur heard. Irregular rate click  Pulmonary/Chest: No stridor. No respiratory distress. She has no wheezes.  Abdominal: Soft. Bowel sounds are normal. She exhibits no distension and no mass.  There is no tenderness. There is no rebound and no guarding.  Musculoskeletal: She exhibits tenderness (R knee w/mild tenderness). She exhibits no edema.  Lymphadenopathy:    She has no cervical adenopathy.  Neurological: She displays normal reflexes. No cranial nerve deficit. She exhibits normal muscle tone. Coordination normal.  Skin: No rash noted. No erythema.  Psychiatric: She has a normal mood and affect. Her behavior is normal. Judgment and thought content normal.   Lab Results  Component Value Date   WBC 4.5 07/01/2013   HGB 11.9* 07/01/2013   HCT 35.9* 07/01/2013   PLT 239.0 07/01/2013   GLUCOSE 112* 10/25/2013   CHOL 112 10/25/2013   TRIG 134.0 10/25/2013   HDL 38.30* 10/25/2013   LDLCALC 47 10/25/2013   ALT 15 10/25/2013   AST 22 10/25/2013   NA 142 10/25/2013   K 4.5 10/25/2013   CL 106 10/25/2013   CREATININE 0.9 10/25/2013   BUN 11 10/25/2013   CO2 26 10/25/2013   TSH 3.91 10/25/2013   INR 2.2 10/11/2013   HGBA1C 6.6* 10/25/2013          Assessment & Plan:

## 2013-11-05 ENCOUNTER — Other Ambulatory Visit: Payer: Self-pay | Admitting: *Deleted

## 2013-11-05 MED ORDER — WARFARIN SODIUM 5 MG PO TABS: 5.0000 mg | ORAL_TABLET | Freq: Every day | ORAL | Status: DC

## 2013-11-05 MED ORDER — OMEPRAZOLE 10 MG PO CPDR: 10.0000 mg | DELAYED_RELEASE_CAPSULE | Freq: Every day | ORAL | Status: DC

## 2013-11-05 MED ORDER — DILTIAZEM HCL ER COATED BEADS 240 MG PO CP24: ORAL_CAPSULE | ORAL | Status: DC

## 2013-11-05 MED ORDER — ACCU-CHEK SOFT TOUCH LANCETS MISC: Status: DC

## 2013-11-05 MED ORDER — ACCU-CHEK AVIVA DEVI: Status: AC

## 2013-11-05 MED ORDER — POTASSIUM CHLORIDE CRYS ER 20 MEQ PO TBCR: 20.0000 meq | EXTENDED_RELEASE_TABLET | Freq: Once | ORAL | Status: DC

## 2013-11-05 MED ORDER — DIGOXIN 250 MCG PO TABS: 0.2500 mg | ORAL_TABLET | Freq: Every day | ORAL | Status: DC

## 2013-11-05 MED ORDER — GLUCOSE BLOOD VI STRP: ORAL_STRIP | Status: DC

## 2013-11-05 MED ORDER — FUROSEMIDE 40 MG PO TABS: 40.0000 mg | ORAL_TABLET | Freq: Every day | ORAL | Status: DC

## 2013-11-05 MED ORDER — BD SWAB SINGLE USE REGULAR PADS: 1.0000 | MEDICATED_PAD | Freq: Every day | Status: DC

## 2013-11-05 MED ORDER — WARFARIN SODIUM 10 MG PO TABS: 10.0000 mg | ORAL_TABLET | Freq: Every day | ORAL | Status: DC

## 2013-11-10 ENCOUNTER — Ambulatory Visit (INDEPENDENT_AMBULATORY_CARE_PROVIDER_SITE_OTHER): Payer: Medicare HMO | Admitting: General Practice

## 2013-11-10 DIAGNOSIS — Z9889 Other specified postprocedural states: Secondary | ICD-10-CM

## 2013-11-10 DIAGNOSIS — I4891 Unspecified atrial fibrillation: Secondary | ICD-10-CM

## 2013-11-10 DIAGNOSIS — Z7901 Long term (current) use of anticoagulants: Secondary | ICD-10-CM

## 2013-11-10 LAB — POCT INR: INR: 2

## 2013-11-10 NOTE — Progress Notes (Signed)
Pre-visit discussion using our clinic review tool. No additional management support is needed unless otherwise documented below in the visit note.  

## 2013-11-15 ENCOUNTER — Ambulatory Visit
Admission: RE | Admit: 2013-11-15 | Discharge: 2013-11-15 | Disposition: A | Discharge status: Home / Self Care | Payer: Commercial Managed Care - HMO | Source: Ambulatory Visit

## 2013-11-15 ENCOUNTER — Other Ambulatory Visit: Payer: Self-pay

## 2013-11-15 DIAGNOSIS — Z1231 Encounter for screening mammogram for malignant neoplasm of breast: Secondary | ICD-10-CM

## 2013-11-19 ENCOUNTER — Telehealth: Payer: Self-pay | Admitting: *Deleted

## 2013-11-19 NOTE — Telephone Encounter (Signed)
Digoxin 250 mcg PA is approved until 10/27/2014.

## 2014-01-12 ENCOUNTER — Ambulatory Visit (INDEPENDENT_AMBULATORY_CARE_PROVIDER_SITE_OTHER): Payer: Commercial Managed Care - HMO | Admitting: General Practice

## 2014-01-12 DIAGNOSIS — Z5181 Encounter for therapeutic drug level monitoring: Secondary | ICD-10-CM

## 2014-01-12 DIAGNOSIS — Z9889 Other specified postprocedural states: Secondary | ICD-10-CM

## 2014-01-12 DIAGNOSIS — I4891 Unspecified atrial fibrillation: Secondary | ICD-10-CM

## 2014-01-12 DIAGNOSIS — Z7901 Long term (current) use of anticoagulants: Secondary | ICD-10-CM

## 2014-01-12 LAB — POCT INR: INR: 1.9

## 2014-01-12 NOTE — Progress Notes (Signed)
Pre visit review using our clinic review tool, if applicable. No additional management support is needed unless otherwise documented below in the visit note. 

## 2014-02-21 ENCOUNTER — Ambulatory Visit: Payer: Medicare Other | Admitting: Internal Medicine

## 2014-02-21 DIAGNOSIS — Z0289 Encounter for other administrative examinations: Secondary | ICD-10-CM

## 2014-03-03 ENCOUNTER — Ambulatory Visit (INDEPENDENT_AMBULATORY_CARE_PROVIDER_SITE_OTHER): Payer: Commercial Managed Care - HMO | Admitting: Cardiovascular Disease

## 2014-03-03 ENCOUNTER — Encounter: Payer: Self-pay | Admitting: Cardiovascular Disease

## 2014-03-03 ENCOUNTER — Ambulatory Visit (INDEPENDENT_AMBULATORY_CARE_PROVIDER_SITE_OTHER): Payer: Commercial Managed Care - HMO | Admitting: General Practice

## 2014-03-03 VITALS — BP 118/78 | HR 99 | Ht 62.5 in | Wt 181.0 lb

## 2014-03-03 DIAGNOSIS — I4891 Unspecified atrial fibrillation: Secondary | ICD-10-CM | POA: Diagnosis not present

## 2014-03-03 DIAGNOSIS — E119 Type 2 diabetes mellitus without complications: Secondary | ICD-10-CM

## 2014-03-03 DIAGNOSIS — I6529 Occlusion and stenosis of unspecified carotid artery: Secondary | ICD-10-CM

## 2014-03-03 DIAGNOSIS — Z9889 Other specified postprocedural states: Secondary | ICD-10-CM

## 2014-03-03 DIAGNOSIS — I10 Essential (primary) hypertension: Secondary | ICD-10-CM

## 2014-03-03 DIAGNOSIS — Z5181 Encounter for therapeutic drug level monitoring: Secondary | ICD-10-CM

## 2014-03-03 DIAGNOSIS — Z7901 Long term (current) use of anticoagulants: Secondary | ICD-10-CM

## 2014-03-03 LAB — POCT INR: INR: 2.6

## 2014-03-03 NOTE — Patient Instructions (Addendum)
Your physician wants you to follow-up in:   YEAR  WITH  DR NISHAN You will receive a reminder letter in the mail two months in advance. If you don't receive a letter, please call our office to schedule the follow-up appointment. Your physician recommends that you continue on your current medications as directed. Please refer to the Current Medication list given to you today. Your physician has requested that you have a carotid duplex. This test is an ultrasound of the carotid arteries in your neck. It looks at blood flow through these arteries that supply the brain with blood. Allow one hour for this exam. There are no restrictions or special instructions.  

## 2014-03-03 NOTE — Assessment & Plan Note (Signed)
May need a bit more cardizem or addition of low dose beta blocker for rate control She will monitor at home and let me know if HR over 80's at rest on regular basis

## 2014-03-03 NOTE — Progress Notes (Signed)
Pre visit review using our clinic review tool, if applicable. No additional management support is needed unless otherwise documented below in the visit note. 

## 2014-03-03 NOTE — Assessment & Plan Note (Signed)
Discussed low carb diet.  Target hemoglobin A1c is 6.5 or less.  Continue current medications.  

## 2014-03-03 NOTE — Assessment & Plan Note (Signed)
F/U duplex RICA 60-79% stenosis  ASA

## 2014-03-03 NOTE — Assessment & Plan Note (Signed)
Well controlled.  Continue current medications and low sodium Dash type diet.    

## 2014-03-03 NOTE — Assessment & Plan Note (Signed)
Normal echo 2014  Coumadin Rx  No bleeding issues SBE advised

## 2014-03-03 NOTE — Progress Notes (Signed)
Patient ID: Beth Miller, female   DOB: February 23, 1941, 73 y.o.   MRN: [id number] Beth Miller is seen for f/u MVR Not seen here since January of 2013. Has multiple issues which include a prosthetic MV - dating back to 1995, chronic atrial fib, on coumadin and carotid disease. She is obese. Past smoker and she is diabetic. Other issues are as noted below. Last echo 01/13/13 reviewed Her EF is normal. 55% MVR normal with moderate LAE   Does not know what her sugars are - does not check. No smoking. Has cramps - was changed from Lasix/Potassium to Aldactone and she notes no improvement. She has actually missed numerous doses of the Aldactone. She does note some chills, sweats and back pain. She will have some occasional palpitations. She is worried that it is time for valve replacement again.   F/U myovue done 01/18/13 normal no gating due to afib  Echo 01/13/13 normal EF and MVR normal Study Conclusions  - Left ventricle: The cavity size was normal. Wall thickness was increased in a pattern of mild LVH. The estimated ejection fraction was 55%. Wall motion was normal; there were no regional wall motion abnormalities. - Aortic valve: The valve appears to be grossly normal. Mild regurgitation. - Mitral valve: Mechanical mitral prosthesis is working well. Mean gradient: 54mm Hg (D). Peak gradient: 46mm Hg (D). - Left atrium: The atrium was moderately dilated. - Right ventricle: The cavity size was mildly dilated. Systolic function was mildly reduced. - Right atrium: The atrium was moderately dilated. - Tricuspid valve: Mild-moderate regurgitation. - Pulmonary arteries: PA peak pressure: 68mm Hg (S).   Reviewed her echo and myovue results with her She seems reassured      ROS: Denies fever, malais, weight loss, blurry vision, decreased visual acuity, cough, sputum, SOB, hemoptysis, pleuritic pain, palpitaitons, heartburn, abdominal pain, melena, lower extremity edema, claudication, or rash.  All  other systems reviewed and negative  General: Affect appropriate Healthy:  appears stated age 73: normal Neck supple with no adenopathy JVP normal right  bruits no thyromegaly Lungs clear with no wheezing and good diaphragmatic motion Heart:  Y1/ click S2 no murmur, no rub, gallop or click PMI normal Abdomen: benighn, BS positve, no tenderness, no AAA no bruit.  No HSM or HJR Distal pulses intact with no bruits No edema Neuro non-focal Skin warm and dry No muscular weakness   Current Outpatient Prescriptions  Medication Sig Dispense Refill  . Alcohol Swabs (B-D SINGLE USE SWABS REGULAR) PADS 1 each by Does not apply route daily.  100 each  3  . amoxicillin (AMOXIL) 500 MG tablet Take four tablets one hour prior to dental procedure  4 tablet  6  . Blood Glucose Monitoring Suppl (ACCU-CHEK AVIVA) device Use as instructed  1 each  0  . Cholecalciferol (VITAMIN D3) 1000 UNIT tablet Take 1,000 Units by mouth daily.        . digoxin (DIGOX) 0.25 MG tablet Take 1 tablet (0.25 mg total) by mouth daily.  90 tablet  3  . diltiazem (CARDIZEM CD) 240 MG 24 hr capsule take 1 capsule by mouth once daily  90 capsule  3  . fluticasone (FLONASE) 50 MCG/ACT nasal spray Place 2 sprays into the nose daily.  16 g  6  . furosemide (LASIX) 40 MG tablet Take 1 tablet (40 mg total) by mouth daily.  90 tablet  3  . glucosamine-chondroitin 500-400 MG tablet Take 1 tablet by mouth daily.        Marland Kitchen  glucose blood (ACCU-CHEK AVIVA PLUS) test strip Use as instructed  100 each  12  . Lancets (ACCU-CHEK SOFT TOUCH) lancets Use as instructed  100 each  3  . loratadine (CLARITIN) 10 MG tablet Take 10 mg by mouth daily. As needed for allergies       . Omega-3 Fatty Acids (FISH OIL) 1000 MG CAPS Take 1 capsule by mouth daily.        Marland Kitchen omeprazole (PRILOSEC) 10 MG capsule Take 1 capsule (10 mg total) by mouth daily.  90 capsule  3  . potassium chloride SA (KLOR-CON M20) 20 MEQ tablet Take 1 tablet (20 mEq total) by  mouth once.  90 tablet  3  . Probiotic Product (ALIGN) 4 MG CAPS Take 1 capsule by mouth as needed.      . promethazine (PHENERGAN) 25 MG tablet Take 25 mg by mouth every 6 (six) hours as needed.      . warfarin (COUMADIN) 10 MG tablet Take 1 tablet (10 mg total) by mouth daily.  90 tablet  3  . warfarin (COUMADIN) 5 MG tablet Take 1 tablet (5 mg total) by mouth daily.  90 tablet  3   No current facility-administered medications for this visit.    Allergies  Food; Furosemide; Quinine; Spironolactone; and Sulfadiazine  Electrocardiogram: Afib rate 99 nonspecific ST/T wave change rate a little higher than  2014  At 73   Assessment and Plan

## 2014-03-10 ENCOUNTER — Encounter (HOSPITAL_COMMUNITY): Payer: Commercial Managed Care - HMO

## 2014-03-16 ENCOUNTER — Encounter (HOSPITAL_COMMUNITY): Payer: Commercial Managed Care - HMO

## 2014-03-16 DIAGNOSIS — R0989 Other specified symptoms and signs involving the circulatory and respiratory systems: Secondary | ICD-10-CM

## 2014-03-17 ENCOUNTER — Encounter (HOSPITAL_COMMUNITY): Payer: Self-pay | Admitting: Cardiovascular Disease

## 2014-04-04 ENCOUNTER — Ambulatory Visit (HOSPITAL_COMMUNITY): Payer: Medicare HMO | Attending: Cardiology | Admitting: Cardiology

## 2014-04-04 DIAGNOSIS — Z87891 Personal history of nicotine dependence: Secondary | ICD-10-CM | POA: Insufficient documentation

## 2014-04-04 DIAGNOSIS — I6529 Occlusion and stenosis of unspecified carotid artery: Secondary | ICD-10-CM

## 2014-04-04 DIAGNOSIS — E785 Hyperlipidemia, unspecified: Secondary | ICD-10-CM | POA: Insufficient documentation

## 2014-04-04 DIAGNOSIS — I359 Nonrheumatic aortic valve disorder, unspecified: Secondary | ICD-10-CM | POA: Insufficient documentation

## 2014-04-04 DIAGNOSIS — I10 Essential (primary) hypertension: Secondary | ICD-10-CM | POA: Insufficient documentation

## 2014-04-04 DIAGNOSIS — I658 Occlusion and stenosis of other precerebral arteries: Secondary | ICD-10-CM | POA: Insufficient documentation

## 2014-04-04 NOTE — Progress Notes (Signed)
Carotid duplex complete 

## 2014-04-19 ENCOUNTER — Ambulatory Visit: Payer: Commercial Managed Care - HMO | Admitting: Internal Medicine

## 2014-04-19 DIAGNOSIS — Z0289 Encounter for other administrative examinations: Secondary | ICD-10-CM

## 2014-04-20 ENCOUNTER — Ambulatory Visit (INDEPENDENT_AMBULATORY_CARE_PROVIDER_SITE_OTHER): Payer: Commercial Managed Care - HMO | Admitting: General Practice

## 2014-04-20 ENCOUNTER — Other Ambulatory Visit: Payer: Commercial Managed Care - HMO

## 2014-04-20 DIAGNOSIS — Z7901 Long term (current) use of anticoagulants: Secondary | ICD-10-CM

## 2014-04-20 DIAGNOSIS — Z9889 Other specified postprocedural states: Secondary | ICD-10-CM

## 2014-04-20 DIAGNOSIS — Z5181 Encounter for therapeutic drug level monitoring: Secondary | ICD-10-CM

## 2014-04-20 DIAGNOSIS — I4891 Unspecified atrial fibrillation: Secondary | ICD-10-CM

## 2014-04-20 LAB — POCT INR: INR: 3

## 2014-04-20 NOTE — Progress Notes (Signed)
Pre visit review using our clinic review tool, if applicable. No additional management support is needed unless otherwise documented below in the visit note. 

## 2014-04-28 ENCOUNTER — Ambulatory Visit (INDEPENDENT_AMBULATORY_CARE_PROVIDER_SITE_OTHER): Payer: Commercial Managed Care - HMO | Admitting: Internal Medicine

## 2014-04-28 ENCOUNTER — Encounter: Payer: Self-pay | Admitting: Internal Medicine

## 2014-04-28 ENCOUNTER — Other Ambulatory Visit (INDEPENDENT_AMBULATORY_CARE_PROVIDER_SITE_OTHER): Payer: Commercial Managed Care - HMO

## 2014-04-28 VITALS — BP 116/80 | HR 80 | Temp 98.4°F | Resp 16 | Wt 185.0 lb

## 2014-04-28 DIAGNOSIS — Z23 Encounter for immunization: Secondary | ICD-10-CM

## 2014-04-28 DIAGNOSIS — R209 Unspecified disturbances of skin sensation: Secondary | ICD-10-CM

## 2014-04-28 DIAGNOSIS — M545 Low back pain, unspecified: Secondary | ICD-10-CM

## 2014-04-28 DIAGNOSIS — R202 Paresthesia of skin: Secondary | ICD-10-CM

## 2014-04-28 DIAGNOSIS — Z0000 Encounter for general adult medical examination without abnormal findings: Secondary | ICD-10-CM

## 2014-04-28 DIAGNOSIS — E119 Type 2 diabetes mellitus without complications: Secondary | ICD-10-CM

## 2014-04-28 DIAGNOSIS — D6489 Other specified anemias: Secondary | ICD-10-CM

## 2014-04-28 DIAGNOSIS — Z7901 Long term (current) use of anticoagulants: Secondary | ICD-10-CM

## 2014-04-28 DIAGNOSIS — I10 Essential (primary) hypertension: Secondary | ICD-10-CM

## 2014-04-28 LAB — CBC WITH DIFFERENTIAL/PLATELET
BASOS PCT: 0.9 % (ref 0.0–3.0)
Basophils Absolute: 0 10*3/uL (ref 0.0–0.1)
EOS ABS: 0.1 10*3/uL (ref 0.0–0.7)
Eosinophils Relative: 2.2 % (ref 0.0–5.0)
HEMATOCRIT: 36.5 % (ref 36.0–46.0)
HEMOGLOBIN: 12.2 g/dL (ref 12.0–15.0)
LYMPHS PCT: 26.6 % (ref 12.0–46.0)
Lymphs Abs: 1.4 10*3/uL (ref 0.7–4.0)
MCHC: 33.4 g/dL (ref 30.0–36.0)
MCV: 84.5 fl (ref 78.0–100.0)
Monocytes Absolute: 0.6 10*3/uL (ref 0.1–1.0)
Monocytes Relative: 11 % (ref 3.0–12.0)
NEUTROS ABS: 3.2 10*3/uL (ref 1.4–7.7)
Neutrophils Relative %: 59.3 % (ref 43.0–77.0)
Platelets: 254 10*3/uL (ref 150.0–400.0)
RBC: 4.32 Mil/uL (ref 3.87–5.11)
RDW: 16.5 % — ABNORMAL HIGH (ref 11.5–15.5)
WBC: 5.4 10*3/uL (ref 4.0–10.5)

## 2014-04-28 LAB — LIPID PANEL
CHOL/HDL RATIO: 3
Cholesterol: 116 mg/dL (ref 0–200)
HDL: 38.8 mg/dL — ABNORMAL LOW (ref >39.00)
LDL Cholesterol: 43 mg/dL (ref 0–99)
NONHDL: 77.2
Triglycerides: 171 mg/dL — ABNORMAL HIGH (ref 0.0–149.0)
VLDL: 34.2 mg/dL (ref 0.0–40.0)

## 2014-04-28 LAB — IBC PANEL
Iron: 59 ug/dL (ref 42–145)
Saturation Ratios: 17 % — ABNORMAL LOW (ref 20.0–50.0)
Transferrin: 247.4 mg/dL (ref 212.0–360.0)

## 2014-04-28 LAB — URINALYSIS, ROUTINE W REFLEX MICROSCOPIC
Bilirubin Urine: NEGATIVE
KETONES UR: NEGATIVE
Leukocytes, UA: NEGATIVE
Nitrite: NEGATIVE
Specific Gravity, Urine: 1.015 (ref 1.000–1.030)
TOTAL PROTEIN, URINE-UPE24: NEGATIVE
Urine Glucose: NEGATIVE
Urobilinogen, UA: 0.2 (ref 0.0–1.0)
pH: 5.5 (ref 5.0–8.0)

## 2014-04-28 LAB — HEPATIC FUNCTION PANEL
ALT: 20 U/L (ref 0–35)
AST: 25 U/L (ref 0–37)
Albumin: 4 g/dL (ref 3.5–5.2)
Alkaline Phosphatase: 74 U/L (ref 39–117)
BILIRUBIN DIRECT: 0.1 mg/dL (ref 0.0–0.3)
Total Bilirubin: 0.5 mg/dL (ref 0.2–1.2)
Total Protein: 7.7 g/dL (ref 6.0–8.3)

## 2014-04-28 LAB — BASIC METABOLIC PANEL
BUN: 14 mg/dL (ref 6–23)
CHLORIDE: 104 meq/L (ref 96–112)
CO2: 25 meq/L (ref 19–32)
Calcium: 9.3 mg/dL (ref 8.4–10.5)
Creatinine, Ser: 1 mg/dL (ref 0.4–1.2)
GFR: 67.23 mL/min (ref >60.00)
Glucose, Bld: 124 mg/dL — ABNORMAL HIGH (ref 70–99)
POTASSIUM: 4.2 meq/L (ref 3.5–5.1)
SODIUM: 138 meq/L (ref 135–145)

## 2014-04-28 LAB — PROTIME-INR
INR: 2 ratio — ABNORMAL HIGH (ref 0.8–1.0)
Prothrombin Time: 21.5 s — ABNORMAL HIGH (ref 9.6–13.1)

## 2014-04-28 LAB — HEMOGLOBIN A1C: Hgb A1c MFr Bld: 6.7 % — ABNORMAL HIGH (ref 4.6–6.5)

## 2014-04-28 LAB — VITAMIN B12: Vitamin B-12: >1500 pg/mL — ABNORMAL HIGH (ref 211–911)

## 2014-04-28 LAB — TSH: TSH: 2.6 u[IU]/mL (ref 0.35–4.50)

## 2014-04-28 NOTE — Assessment & Plan Note (Signed)
Continue with current prescription therapy as reflected on the Med list.  

## 2014-04-28 NOTE — Assessment & Plan Note (Signed)
Labs

## 2014-04-28 NOTE — Assessment & Plan Note (Signed)
Here for medicare wellness/physical  Diet: heart healthy  Physical activity: not sedentary  Depression/mood screen: negative  Hearing: intact to whispered voice  Visual acuity: grossly normal, performs annual eye exam  ADLs: capable  Fall risk: none  Home safety: good  Cognitive evaluation: intact to orientation, naming, recall and repetition  EOL planning: adv directives, full code/ I agree  I have personally reviewed and have noted  1. The patient's medical and social history  2. Their use of alcohol, tobacco or illicit drugs  3. Their current medications and supplements  4. The patient's functional ability including ADL's, fall risks, home safety risks and hearing or visual impairment.  5. Diet and physical activities  6. Evidence for depression or mood disorders    Today patient counseled on age appropriate routine health concerns for screening and prevention, each reviewed and up to date or declined. Immunizations reviewed and up to date or declined. Labs ordered and reviewed. Risk factors for depression reviewed and negative. Hearing function and visual acuity are intact. ADLs screened and addressed as needed. Functional ability and level of safety reviewed and appropriate. Education, counseling and referrals performed based on assessed risks today. Patient provided with a copy of personalized plan for preventive services.        DT 2005

## 2014-04-28 NOTE — Progress Notes (Signed)
   Subjective:    HPI  F/u memory issues, forgetful - better  The patient presents for a follow-up of  chronic hypertension, chronic dyslipidemia, anticoagulation, controlled with medicines. F/u OA, R knee pain  Wt Readings from Last 3 Encounters:  04/28/14 185 lb (83.915 kg)  03/03/14 181 lb (82.101 kg)  10/25/13 183 lb (83.008 kg)   BP Readings from Last 3 Encounters:  04/28/14 116/80  03/03/14 118/78  10/25/13 110/60     Review of Systems  Constitutional: Negative for chills, activity change, appetite change, fatigue and unexpected weight change.  HENT: Negative for congestion, mouth sores and sinus pressure.   Eyes: Negative for visual disturbance.  Respiratory: Negative for cough and chest tightness.   Gastrointestinal: Negative for nausea and abdominal pain.  Genitourinary: Negative for frequency, difficulty urinating and vaginal pain.  Musculoskeletal: Positive for arthralgias (R knee). Negative for back pain and gait problem.  Skin: Negative for pallor and rash.  Neurological: Negative for dizziness, tremors, weakness, numbness and headaches.  Psychiatric/Behavioral: Negative for confusion and sleep disturbance.       Objective:   Physical Exam  Constitutional: She appears well-developed. No distress.  obese  HENT:  Head: Normocephalic.  Right Ear: External ear normal.  Left Ear: External ear normal.  Nose: Nose normal.  Mouth/Throat: Oropharynx is clear and moist.  Eyes: Conjunctivae are normal. Pupils are equal, round, and reactive to light. Right eye exhibits no discharge. Left eye exhibits no discharge.  Neck: Normal range of motion. Neck supple. No JVD present. No tracheal deviation present. No thyromegaly present.  Cardiovascular: Normal heart sounds.   No murmur heard. Irregular rate click  Pulmonary/Chest: No stridor. No respiratory distress. She has no wheezes.  Abdominal: Soft. Bowel sounds are normal. She exhibits no distension and no mass.  There is no tenderness. There is no rebound and no guarding.  Musculoskeletal: She exhibits tenderness (R knee w/mild tenderness). She exhibits no edema.  Lymphadenopathy:    She has no cervical adenopathy.  Neurological: She displays normal reflexes. No cranial nerve deficit. She exhibits normal muscle tone. Coordination normal.  Skin: No rash noted. No erythema.  Psychiatric: She has a normal mood and affect. Her behavior is normal. Judgment and thought content normal.   Lab Results  Component Value Date   WBC 4.5 07/01/2013   HGB 11.9* 07/01/2013   HCT 35.9* 07/01/2013   PLT 239.0 07/01/2013   GLUCOSE 112* 10/25/2013   CHOL 112 10/25/2013   TRIG 134.0 10/25/2013   HDL 38.30* 10/25/2013   LDLCALC 47 10/25/2013   ALT 15 10/25/2013   AST 22 10/25/2013   NA 142 10/25/2013   K 4.5 10/25/2013   CL 106 10/25/2013   CREATININE 0.9 10/25/2013   BUN 11 10/25/2013   CO2 26 10/25/2013   TSH 3.91 10/25/2013   INR 3.0 04/20/2014   HGBA1C 6.6* 10/25/2013          Assessment & Plan:

## 2014-04-28 NOTE — Progress Notes (Signed)
Pre visit review using our clinic review tool, if applicable. No additional management support is needed unless otherwise documented below in the visit note. 

## 2014-04-28 NOTE — Patient Instructions (Signed)
Zostavax

## 2014-04-28 NOTE — Assessment & Plan Note (Signed)
Continue with current prn prescription therapy as reflected on the Med list.  

## 2014-05-18 ENCOUNTER — Ambulatory Visit (INDEPENDENT_AMBULATORY_CARE_PROVIDER_SITE_OTHER): Payer: Commercial Managed Care - HMO | Admitting: General Practice

## 2014-05-18 DIAGNOSIS — Z5181 Encounter for therapeutic drug level monitoring: Secondary | ICD-10-CM

## 2014-05-18 DIAGNOSIS — Z7901 Long term (current) use of anticoagulants: Secondary | ICD-10-CM

## 2014-05-18 DIAGNOSIS — Z9889 Other specified postprocedural states: Secondary | ICD-10-CM

## 2014-05-18 DIAGNOSIS — I4891 Unspecified atrial fibrillation: Secondary | ICD-10-CM

## 2014-05-18 LAB — POCT INR: INR: 2.9

## 2014-05-18 NOTE — Progress Notes (Signed)
Pre visit review using our clinic review tool, if applicable. No additional management support is needed unless otherwise documented below in the visit note. 

## 2014-06-29 ENCOUNTER — Ambulatory Visit (INDEPENDENT_AMBULATORY_CARE_PROVIDER_SITE_OTHER): Payer: Commercial Managed Care - HMO | Admitting: *Deleted

## 2014-06-29 DIAGNOSIS — Z7901 Long term (current) use of anticoagulants: Secondary | ICD-10-CM

## 2014-06-29 DIAGNOSIS — Z5181 Encounter for therapeutic drug level monitoring: Secondary | ICD-10-CM

## 2014-06-29 DIAGNOSIS — I4891 Unspecified atrial fibrillation: Secondary | ICD-10-CM

## 2014-06-29 DIAGNOSIS — Z9889 Other specified postprocedural states: Secondary | ICD-10-CM

## 2014-06-29 LAB — POCT INR: INR: 2.1

## 2014-07-08 ENCOUNTER — Emergency Department (HOSPITAL_COMMUNITY)
Admission: EM | Admit: 2014-07-08 | Discharge: 2014-07-08 | Disposition: A | Discharge status: Home / Self Care | Payer: Medicare HMO | Attending: Emergency Medicine | Admitting: Emergency Medicine

## 2014-07-08 ENCOUNTER — Emergency Department (HOSPITAL_COMMUNITY): Payer: Medicare HMO

## 2014-07-08 ENCOUNTER — Encounter (HOSPITAL_COMMUNITY): Payer: Self-pay | Admitting: Emergency Medicine

## 2014-07-08 DIAGNOSIS — Z8673 Personal history of transient ischemic attack (TIA), and cerebral infarction without residual deficits: Secondary | ICD-10-CM | POA: Diagnosis not present

## 2014-07-08 DIAGNOSIS — K219 Gastro-esophageal reflux disease without esophagitis: Secondary | ICD-10-CM | POA: Diagnosis not present

## 2014-07-08 DIAGNOSIS — W010XXA Fall on same level from slipping, tripping and stumbling without subsequent striking against object, initial encounter: Secondary | ICD-10-CM | POA: Diagnosis not present

## 2014-07-08 DIAGNOSIS — S139XXA Sprain of joints and ligaments of unspecified parts of neck, initial encounter: Secondary | ICD-10-CM

## 2014-07-08 DIAGNOSIS — Y9289 Other specified places as the place of occurrence of the external cause: Secondary | ICD-10-CM | POA: Diagnosis not present

## 2014-07-08 DIAGNOSIS — S0990XA Unspecified injury of head, initial encounter: Secondary | ICD-10-CM | POA: Insufficient documentation

## 2014-07-08 DIAGNOSIS — Z87891 Personal history of nicotine dependence: Secondary | ICD-10-CM | POA: Insufficient documentation

## 2014-07-08 DIAGNOSIS — Z79899 Other long term (current) drug therapy: Secondary | ICD-10-CM | POA: Diagnosis not present

## 2014-07-08 DIAGNOSIS — Z7901 Long term (current) use of anticoagulants: Secondary | ICD-10-CM | POA: Diagnosis not present

## 2014-07-08 DIAGNOSIS — S199XXA Unspecified injury of neck, initial encounter: Secondary | ICD-10-CM

## 2014-07-08 DIAGNOSIS — IMO0002 Reserved for concepts with insufficient information to code with codable children: Secondary | ICD-10-CM | POA: Insufficient documentation

## 2014-07-08 DIAGNOSIS — H259 Unspecified age-related cataract: Secondary | ICD-10-CM | POA: Diagnosis not present

## 2014-07-08 DIAGNOSIS — W19XXXA Unspecified fall, initial encounter: Secondary | ICD-10-CM

## 2014-07-08 DIAGNOSIS — S0993XA Unspecified injury of face, initial encounter: Secondary | ICD-10-CM | POA: Diagnosis present

## 2014-07-08 DIAGNOSIS — I509 Heart failure, unspecified: Secondary | ICD-10-CM | POA: Insufficient documentation

## 2014-07-08 DIAGNOSIS — E119 Type 2 diabetes mellitus without complications: Secondary | ICD-10-CM | POA: Diagnosis not present

## 2014-07-08 DIAGNOSIS — Y9389 Activity, other specified: Secondary | ICD-10-CM | POA: Insufficient documentation

## 2014-07-08 DIAGNOSIS — I10 Essential (primary) hypertension: Secondary | ICD-10-CM | POA: Insufficient documentation

## 2014-07-08 LAB — BASIC METABOLIC PANEL
ANION GAP: 11 (ref 5–15)
BUN: 13 mg/dL (ref 6–23)
CHLORIDE: 103 meq/L (ref 96–112)
CO2: 28 mEq/L (ref 19–32)
CREATININE: 0.96 mg/dL (ref 0.50–1.10)
Calcium: 9.8 mg/dL (ref 8.4–10.5)
GFR, EST AFRICAN AMERICAN: 66 mL/min — AB (ref >90)
GFR, EST NON AFRICAN AMERICAN: 57 mL/min — AB (ref >90)
Glucose, Bld: 90 mg/dL (ref 70–99)
Potassium: 4.2 mEq/L (ref 3.7–5.3)
Sodium: 142 mEq/L (ref 137–147)

## 2014-07-08 LAB — CBC WITH DIFFERENTIAL/PLATELET
BASOS ABS: 0 10*3/uL (ref 0.0–0.1)
BASOS PCT: 0 % (ref 0–1)
Eosinophils Absolute: 0.1 10*3/uL (ref 0.0–0.7)
Eosinophils Relative: 1 % (ref 0–5)
HCT: 36.9 % (ref 36.0–46.0)
Hemoglobin: 12.6 g/dL (ref 12.0–15.0)
Lymphocytes Relative: 28 % (ref 12–46)
Lymphs Abs: 1.9 10*3/uL (ref 0.7–4.0)
MCH: 27.9 pg (ref 26.0–34.0)
MCHC: 34.1 g/dL (ref 30.0–36.0)
MCV: 81.6 fL (ref 78.0–100.0)
MONO ABS: 0.6 10*3/uL (ref 0.1–1.0)
Monocytes Relative: 9 % (ref 3–12)
NEUTROS ABS: 4.1 10*3/uL (ref 1.7–7.7)
Neutrophils Relative %: 62 % (ref 43–77)
Platelets: 262 10*3/uL (ref 150–400)
RBC: 4.52 MIL/uL (ref 3.87–5.11)
RDW: 15.3 % (ref 11.5–15.5)
WBC: 6.8 10*3/uL (ref 4.0–10.5)

## 2014-07-08 LAB — PROTIME-INR
INR: 3.54 — ABNORMAL HIGH (ref 0.00–1.49)
Prothrombin Time: 35.4 seconds — ABNORMAL HIGH (ref 11.6–15.2)

## 2014-07-08 MED ORDER — METHOCARBAMOL 500 MG PO TABS: 500.0000 mg | ORAL_TABLET | Freq: Four times a day (QID) | ORAL | Status: DC | PRN

## 2014-07-08 MED ORDER — DIAZEPAM 5 MG PO TABS
5.0000 mg | ORAL_TABLET | Freq: Once | ORAL | Status: AC
  Administered 2014-07-08: 5 mg via ORAL
  Filled 2014-07-08: qty 1

## 2014-07-08 NOTE — ED Provider Notes (Signed)
CSN: [id number]     Arrival date & time 07/08/14  1413 History   First MD Initiated Contact with Patient 07/08/14 1750     No chief complaint on file.    (Consider location/radiation/quality/duration/timing/severity/associated sxs/prior Treatment) Patient is a 73 y.o. female presenting with headaches.  Headache Pain location:  Generalized Quality:  Dull Radiates to:  Does not radiate Onset quality:  Gradual Duration:  1 day Timing:  Constant Progression:  Worsening Chronicity:  New Context comment:  Fell yesterday, also with neck stiffness Relieved by:  Nothing Worsened by:  Neck movement Ineffective treatments:  None tried Associated symptoms: no abdominal pain, no back pain, no blurred vision, no congestion, no cough, no diarrhea, no dizziness, no fever, no focal weakness, no nausea, no numbness, no paresthesias, no photophobia, no sore throat, no visual change and no vomiting     Past Medical History  Diagnosis Date  . Hypertension   . Diabetes mellitus   . GERD (gastroesophageal reflux disease)   . AF (atrial fibrillation)   . Chronic anticoagulation   . Chest pain   . Hematoma   . Diverticulosis   . Paresthesia   . Cataract, senile   . Disorder of oral soft tissue     of mouth  . Leg pain   . Sweating   . ALLERGIC RHINITIS   . Low back pain   . Diabetes mellitus   . Status post mitral valve replacement     [employer] valve  . Vitamin D deficiency   . Blood transfusion without reported diagnosis   . CHF (congestive heart failure)   . Rheumatic fever   . Stroke     tia   Past Surgical History  Procedure Laterality Date  . Abdominal hysterectomy    . Cholecystectomy    . Cardiac valve replacement  1995    Mitral valve prosthesis; [facility]   Family History  Problem Relation Age of Onset  . Heart disease Brother   . Diabetes Mother   . Seizures Mother   . Diabetes Brother   . Heart disease Brother   . Pancreatic cancer Brother   . Colon cancer Neg Hx    . Other Mother     brain tumor   History  Substance Use Topics  . Smoking status: Former Smoker -- 0.02 packs/day for 60 years    Types: Cigarettes    Quit date: 10/28/2002  . Smokeless tobacco: Never Used  . Alcohol Use: No   OB History   Grav Para Term Preterm Abortions TAB SAB Ect Mult Living                 Review of Systems  Constitutional: Negative for fever and chills.  HENT: Negative for congestion, rhinorrhea and sore throat.   Eyes: Negative for blurred vision, photophobia and visual disturbance.  Respiratory: Negative for cough and shortness of breath.   Cardiovascular: Negative for chest pain and leg swelling.  Gastrointestinal: Negative for nausea, vomiting, abdominal pain, diarrhea and constipation.  Endocrine: Negative for polyphagia and polyuria.  Genitourinary: Negative for dysuria, flank pain, vaginal bleeding, vaginal discharge and enuresis.  Musculoskeletal: Negative for back pain and gait problem.  Skin: Negative for color change and rash.  Neurological: Positive for headaches. Negative for dizziness, focal weakness, syncope, light-headedness, numbness and paresthesias.  Hematological: Negative for adenopathy. Does not bruise/bleed easily.  All other systems reviewed and are negative.     Allergies  Food; Furosemide; Quinine; Spironolactone; and Sulfadiazine  Home Medications   Prior to Admission medications   Medication Sig Start Date End Date Taking? Authorizing Provider  Alcohol Swabs (B-D SINGLE USE SWABS REGULAR) PADS 1 each by Does not apply route daily. 11/05/13   Aleksei Plotnikov V, MD  amoxicillin (AMOXIL) 500 MG tablet Take four tablets one hour prior to dental procedure 10/25/13   Lew Dawes V, MD  BEE POLLEN PO Take 2 capsules by mouth daily.    Historical Provider, MD  Blood Glucose Monitoring Suppl (ACCU-CHEK AVIVA) device Use as instructed 11/05/13 11/05/14  Lew Dawes V, MD  Cholecalciferol (VITAMIN D3) 1000 UNIT tablet Take  1,000 Units by mouth daily.      Historical Provider, MD  digoxin (DIGOX) 0.25 MG tablet Take 1 tablet (0.25 mg total) by mouth daily. 11/05/13   Aleksei Plotnikov V, MD  diltiazem (CARDIZEM CD) 240 MG 24 hr capsule take 1 capsule by mouth once daily 11/05/13   Aleksei Plotnikov V, MD  fluticasone (FLONASE) 50 MCG/ACT nasal spray Place 2 sprays into the nose daily. 07/20/13   Aleksei Plotnikov V, MD  furosemide (LASIX) 40 MG tablet Take 1 tablet (40 mg total) by mouth daily. 11/05/13   Aleksei Plotnikov V, MD  glucosamine-chondroitin 500-400 MG tablet Take 1 tablet by mouth daily.      Historical Provider, MD  glucose blood (ACCU-CHEK AVIVA PLUS) test strip Use as instructed 11/05/13   Aleksei Plotnikov V, MD  Lancets (ACCU-CHEK SOFT TOUCH) lancets Use as instructed 11/05/13   Aleksei Plotnikov V, MD  loratadine (CLARITIN) 10 MG tablet Take 10 mg by mouth daily. As needed for allergies     Historical Provider, MD  methocarbamol (ROBAXIN) 500 MG tablet Take 1 tablet (500 mg total) by mouth every 6 (six) hours as needed for muscle spasms. 07/08/14   Debby Freiberg, MD  Omega-3 Fatty Acids (FISH OIL) 1000 MG CAPS Take 1 capsule by mouth daily.      Historical Provider, MD  omeprazole (PRILOSEC) 10 MG capsule Take 1 capsule (10 mg total) by mouth daily. 11/05/13 12/03/14  Aleksei Plotnikov V, MD  potassium chloride SA (KLOR-CON M20) 20 MEQ tablet Take 1 tablet (20 mEq total) by mouth once. 11/05/13   Aleksei Plotnikov V, MD  Probiotic Product (ALIGN) 4 MG CAPS Take 1 capsule by mouth as needed. 07/17/12   Lafayette Dragon, MD  promethazine (PHENERGAN) 25 MG tablet Take 25 mg by mouth every 6 (six) hours as needed. 02/16/12 08/27/13  Veryl Speak, MD  warfarin (COUMADIN) 10 MG tablet Take 1 tablet (10 mg total) by mouth daily. 11/05/13   Aleksei Plotnikov V, MD  warfarin (COUMADIN) 5 MG tablet Take 1 tablet (5 mg total) by mouth daily. 11/05/13   Aleksei Plotnikov V, MD   BP 127/96  Pulse 72  Temp(Src) 99.1 F (37.3 C) (Oral)   Resp 18  Ht 5\' 2"  (1.575 m)  Wt 181 lb (82.101 kg)  BMI 33.10 kg/m2  SpO2 96% Physical Exam  Vitals reviewed. Constitutional: She is oriented to person, place, and time. She appears well-developed and well-nourished.  HENT:  Head: Normocephalic and atraumatic.  Right Ear: External ear normal.  Left Ear: External ear normal.  Eyes: Conjunctivae and EOM are normal. Pupils are equal, round, and reactive to light.  Neck: Normal range of motion. Neck supple.  Cardiovascular: Normal rate, regular rhythm, normal heart sounds and intact distal pulses.   Pulmonary/Chest: Effort normal and breath sounds normal.  Abdominal: Soft. Bowel sounds are normal. There  is no tenderness.  Musculoskeletal: Normal range of motion.       Cervical back: Normal.       Thoracic back: She exhibits tenderness. She exhibits no bony tenderness.       Lumbar back: Normal.  Neurological: She is alert and oriented to person, place, and time. She has normal strength and normal reflexes. No cranial nerve deficit or sensory deficit. Gait normal. GCS eye subscore is 4. GCS verbal subscore is 5. GCS motor subscore is 6.  Skin: Skin is warm and dry.    ED Course  Procedures (including critical care time) Labs Review Labs Reviewed  BASIC METABOLIC PANEL - Abnormal; Notable for the following:    GFR calc non Af Amer 57 (*)    GFR calc Af Amer 66 (*)    All other components within normal limits  PROTIME-INR - Abnormal; Notable for the following:    Prothrombin Time 35.4 (*)    INR 3.54 (*)    All other components within normal limits  CBC WITH DIFFERENTIAL    Imaging Review Ct Head Wo Contrast  07/08/2014   CLINICAL DATA:  Fall  EXAM: CT HEAD WITHOUT CONTRAST  CT CERVICAL SPINE WITHOUT CONTRAST  TECHNIQUE: Multidetector CT imaging of the head and cervical spine was performed following the standard protocol without intravenous contrast. Multiplanar CT image reconstructions of the cervical spine were also generated.   COMPARISON:  Prior CT scan of the cervical spine 03/25/2013; prior CT scan of the head 09/06/2010 ; prior thyroid ultrasound 10/15/2013  FINDINGS: CT HEAD FINDINGS  Negative for acute intracranial hemorrhage, acute infarction, mass, mass effect, hydrocephalus or midline shift. Gray-white differentiation is preserved throughout. Mild cerebral volume loss without significant interval progression. No focal scalp hematoma or calvarial abnormality. Normal aeration of the mastoid air cells and visualized paranasal sinuses.  CT CERVICAL SPINE FINDINGS  No acute fracture, malalignment or prevertebral soft tissue swelling. Multilevel cervical spondylosis most significant at C5-C6 and C6-C7 where calcified posterior disc osteophyte complexes results and mild narrowing of the central canal. Multilevel left-sided facet arthropathy at C3-C4, C4-C5 and C5-C6. Heterogeneous, multinodular thyroid gland. The dominant nodule in the left gland measures approximately 2.8 x 2.5 cm. No acute soft tissue abnormality. The lung apices are unremarkable. Incompletely imaged thoracic aorta with ectasia of the arch measuring up to 3.3 cm. No suspicious pulmonary nodules.  IMPRESSION: CT HEAD  1. No acute intracranial abnormality. CT CSPINE  1. No acute fracture or malalignment. 2. Multilevel cervical spondylosis and left-sided facet arthropathy.   Electronically Signed   By: Jacqulynn Cadet M.D.   On: 07/08/2014 15:19   Ct Cervical Spine Wo Contrast  07/08/2014   CLINICAL DATA:  Fall  EXAM: CT HEAD WITHOUT CONTRAST  CT CERVICAL SPINE WITHOUT CONTRAST  TECHNIQUE: Multidetector CT imaging of the head and cervical spine was performed following the standard protocol without intravenous contrast. Multiplanar CT image reconstructions of the cervical spine were also generated.  COMPARISON:  Prior CT scan of the cervical spine 03/25/2013; prior CT scan of the head 09/06/2010 ; prior thyroid ultrasound 10/15/2013  FINDINGS: CT HEAD FINDINGS   Negative for acute intracranial hemorrhage, acute infarction, mass, mass effect, hydrocephalus or midline shift. Gray-white differentiation is preserved throughout. Mild cerebral volume loss without significant interval progression. No focal scalp hematoma or calvarial abnormality. Normal aeration of the mastoid air cells and visualized paranasal sinuses.  CT CERVICAL SPINE FINDINGS  No acute fracture, malalignment or prevertebral soft tissue swelling. Multilevel cervical  spondylosis most significant at C5-C6 and C6-C7 where calcified posterior disc osteophyte complexes results and mild narrowing of the central canal. Multilevel left-sided facet arthropathy at C3-C4, C4-C5 and C5-C6. Heterogeneous, multinodular thyroid gland. The dominant nodule in the left gland measures approximately 2.8 x 2.5 cm. No acute soft tissue abnormality. The lung apices are unremarkable. Incompletely imaged thoracic aorta with ectasia of the arch measuring up to 3.3 cm. No suspicious pulmonary nodules.  IMPRESSION: CT HEAD  1. No acute intracranial abnormality. CT CSPINE  1. No acute fracture or malalignment. 2. Multilevel cervical spondylosis and left-sided facet arthropathy.   Electronically Signed   By: Jacqulynn Cadet M.D.   On: 07/08/2014 15:19     EKG Interpretation None      MDM   Final diagnoses:  Fall, initial encounter  Neck sprain, initial encounter    73 y.o. female  with pertinent PMH of mvr on coumadin presents with headache and neck pain after fall yesterday.  Fall mechanical, pt landed on gluts, did not hit head or have LOC.  She had gradual onset of neck pain throughout the day today.  No neuro complaints on history with exception of headache.  Physical exam and vitals today as above.  No signs of cauda equina or acute spinal cord pathology.  Discussed possibility of acute ligamentous injury with patient, however the mechanism of very low-speed fall and no whiplash movement consider this unlikely.  Patient was offered a soft cervical collar, however refused. Patient to followup with PCP. She's given standard return precautions, voiced understanding and will followup.    Labs and imaging as above reviewed.   1. Fall, initial encounter   2. Neck sprain, initial encounter         Debby Freiberg, MD 07/08/14 1827

## 2014-07-08 NOTE — Discharge Instructions (Signed)
Cervical Sprain °A cervical sprain is an injury in the neck in which the strong, fibrous tissues (ligaments) that connect your neck bones stretch or tear. Cervical sprains can range from mild to severe. Severe cervical sprains can cause the neck vertebrae to be unstable. This can lead to damage of the spinal cord and can result in serious nervous system problems. The amount of time it takes for a cervical sprain to get better depends on the cause and extent of the injury. Most cervical sprains heal in 1 to 3 weeks. °CAUSES  °Severe cervical sprains may be caused by:  °· Contact sport injuries (such as from football, rugby, wrestling, hockey, auto racing, gymnastics, diving, martial arts, or boxing).   °· Motor vehicle collisions.   °· Whiplash injuries. This is an injury from a sudden forward and backward whipping movement of the head and neck.  °· Falls.   °Mild cervical sprains may be caused by:  °· Being in an awkward position, such as while cradling a telephone between your ear and shoulder.   °· Sitting in a chair that does not offer proper support.   °· Working at a poorly designed computer station.   °· Looking up or down for long periods of time.   °SYMPTOMS  °· Pain, soreness, stiffness, or a burning sensation in the front, back, or sides of the neck. This discomfort may develop immediately after the injury or slowly, 24 hours or more after the injury.   °· Pain or tenderness directly in the middle of the back of the neck.   °· Shoulder or upper back pain.   °· Limited ability to move the neck.   °· Headache.   °· Dizziness.   °· Weakness, numbness, or tingling in the hands or arms.   °· Muscle spasms.   °· Difficulty swallowing or chewing.   °· Tenderness and swelling of the neck.   °DIAGNOSIS  °Most of the time your health care provider can diagnose a cervical sprain by taking your history and doing a physical exam. Your health care provider will ask about previous neck injuries and any known neck  problems, such as arthritis in the neck. X-rays may be taken to find out if there are any other problems, such as with the bones of the neck. Other tests, such as a CT scan or MRI, may also be needed.  °TREATMENT  °Treatment depends on the severity of the cervical sprain. Mild sprains can be treated with rest, keeping the neck in place (immobilization), and pain medicines. Severe cervical sprains are immediately immobilized. Further treatment is done to help with pain, muscle spasms, and other symptoms and may include: °· Medicines, such as pain relievers, numbing medicines, or muscle relaxants.   °· Physical therapy. This may involve stretching exercises, strengthening exercises, and posture training. Exercises and improved posture can help stabilize the neck, strengthen muscles, and help stop symptoms from returning.   °HOME CARE INSTRUCTIONS  °· Put ice on the injured area.   °¨ Put ice in a plastic bag.   °¨ Place a towel between your skin and the bag.   °¨ Leave the ice on for 15-20 minutes, 3-4 times a day.   °· If your injury was severe, you may have been given a cervical collar to wear. A cervical collar is a two-piece collar designed to keep your neck from moving while it heals. °¨ Do not remove the collar unless instructed by your health care provider. °¨ If you have long hair, keep it outside of the collar. °¨ Ask your health care provider before making any adjustments to your collar. Minor   adjustments may be required over time to improve comfort and reduce pressure on your chin or on the back of your head. °¨ If you are allowed to remove the collar for cleaning or bathing, follow your health care provider's instructions on how to do so safely. °¨ Keep your collar clean by wiping it with mild soap and water and drying it completely. If the collar you have been given includes removable pads, remove them every 1-2 days and hand wash them with soap and water. Allow them to air dry. They should be completely  dry before you wear them in the collar. °¨ If you are allowed to remove the collar for cleaning and bathing, wash and dry the skin of your neck. Check your skin for irritation or sores. If you see any, tell your health care provider. °¨ Do not drive while wearing the collar.   °· Only take over-the-counter or prescription medicines for pain, discomfort, or fever as directed by your health care provider.   °· Keep all follow-up appointments as directed by your health care provider.   °· Keep all physical therapy appointments as directed by your health care provider.   °· Make any needed adjustments to your workstation to promote good posture.   °· Avoid positions and activities that make your symptoms worse.   °· Warm up and stretch before being active to help prevent problems.   °SEEK MEDICAL CARE IF:  °· Your pain is not controlled with medicine.   °· You are unable to decrease your pain medicine over time as planned.   °· Your activity level is not improving as expected.   °SEEK IMMEDIATE MEDICAL CARE IF:  °· You develop any bleeding. °· You develop stomach upset. °· You have signs of an allergic reaction to your medicine.   °· Your symptoms get worse.   °· You develop new, unexplained symptoms.   °· You have numbness, tingling, weakness, or paralysis in any part of your body.   °MAKE SURE YOU:  °· Understand these instructions. °· Will watch your condition. °· Will get help right away if you are not doing well or get worse. °Document Released: 08/11/2007 Document Revised: 10/19/2013 Document Reviewed: 04/21/2013 °[employer]® Patient Information ©2015 [employer]. This information is not intended to replace advice given to you by your health care provider. Make sure you discuss any questions you have with your health care provider. ° °Head Injury °You have received a head injury. It does not appear serious at this time. Headaches and vomiting are common following head injury. It should be easy to awaken from  sleeping. Sometimes it is necessary for you to stay in the emergency department for a while for observation. Sometimes admission to the hospital may be needed. After injuries such as yours, most problems occur within the first 24 hours, but side effects may occur up to 7-10 days after the injury. It is important for you to carefully monitor your condition and contact your health care provider or seek immediate medical care if there is a change in your condition. °WHAT ARE THE TYPES OF HEAD INJURIES? °Head injuries can be as minor as a bump. Some head injuries can be more severe. More severe head injuries include: °· A jarring injury to the brain (concussion). °· A bruise of the brain (contusion). This mean there is bleeding in the brain that can cause swelling. °· A cracked skull (skull fracture). °· Bleeding in the brain that collects, clots, and forms a bump (hematoma). °WHAT CAUSES A HEAD INJURY? °A serious head injury is most likely to   happen to someone who is in a car wreck and is not wearing a seat belt. Other causes of major head injuries include bicycle or motorcycle accidents, sports injuries, and falls. °HOW ARE HEAD INJURIES DIAGNOSED? °A complete history of the event leading to the injury and your current symptoms will be helpful in diagnosing head injuries. Many times, pictures of the brain, such as CT or MRI are needed to see the extent of the injury. Often, an overnight hospital stay is necessary for observation.  °WHEN SHOULD I SEEK IMMEDIATE MEDICAL CARE?  °You should get help right away if: °· You have confusion or drowsiness. °· You feel sick to your stomach (nauseous) or have continued, forceful vomiting. °· You have dizziness or unsteadiness that is getting worse. °· You have severe, continued headaches not relieved by medicine. Only take over-the-counter or prescription medicines for pain, fever, or discomfort as directed by your health care provider. °· You do not have normal function of the  arms or legs or are unable to walk. °· You notice changes in the black spots in the center of the colored part of your eye (pupil). °· You have a clear or bloody fluid coming from your nose or ears. °· You have a loss of vision. °During the next 24 hours after the injury, you must stay with someone who can watch you for the warning signs. This person should contact local emergency services (911 in the [place] if you have seizures, you become unconscious, or you are unable to wake up. °HOW CAN I PREVENT A HEAD INJURY IN THE FUTURE? °The most important factor for preventing major head injuries is avoiding motor vehicle accidents.  To minimize the potential for damage to your head, it is crucial to wear seat belts while riding in motor vehicles. Wearing helmets while bike riding and playing collision sports (like football) is also helpful. Also, avoiding dangerous activities around the house will further help reduce your risk of head injury.  °WHEN CAN I RETURN TO NORMAL ACTIVITIES AND ATHLETICS? °You should be reevaluated by your health care provider before returning to these activities. If you have any of the following symptoms, you should not return to activities or contact sports until 1 week after the symptoms have stopped: °· Persistent headache. °· Dizziness or vertigo. °· Poor attention and concentration. °· Confusion. °· Memory problems. °· Nausea or vomiting. °· Fatigue or tire easily. °· Irritability. °· Intolerant of bright lights or loud noises. °· Anxiety or depression. °· Disturbed sleep. °MAKE SURE YOU:  °· Understand these instructions. °· Will watch your condition. °· Will get help right away if you are not doing well or get worse. °Document Released: 10/14/2005 Document Revised: 10/19/2013 Document Reviewed: 06/21/2013 °[employer]® Patient Information ©2015 [employer]. This information is not intended to replace advice given to you by your health care provider. Make sure you discuss any questions you  have with your health care provider. ° °

## 2014-07-08 NOTE — ED Notes (Signed)
Pt presents with neck pain after slipping and falling in tub yesterday morning.  Pt reports her feet went out from under her, pt fell onto buttocks - denies hitting her head; denies pain to buttocks, hips or legs, reports neck pain.

## 2014-07-15 ENCOUNTER — Ambulatory Visit (INDEPENDENT_AMBULATORY_CARE_PROVIDER_SITE_OTHER): Payer: Commercial Managed Care - HMO | Admitting: Internal Medicine

## 2014-07-15 ENCOUNTER — Encounter: Payer: Self-pay | Admitting: Internal Medicine

## 2014-07-15 ENCOUNTER — Ambulatory Visit: Payer: Commercial Managed Care - HMO

## 2014-07-15 VITALS — BP 120/80 | HR 79 | Temp 97.8°F | Wt 191.4 lb

## 2014-07-15 DIAGNOSIS — M542 Cervicalgia: Secondary | ICD-10-CM

## 2014-07-15 DIAGNOSIS — Z7901 Long term (current) use of anticoagulants: Secondary | ICD-10-CM

## 2014-07-15 DIAGNOSIS — M47812 Spondylosis without myelopathy or radiculopathy, cervical region: Secondary | ICD-10-CM

## 2014-07-15 LAB — PROTIME-INR
INR: 2.3 ratio — AB (ref 0.8–1.0)
PROTHROMBIN TIME: 24.6 s — AB (ref 9.6–13.1)

## 2014-07-15 MED ORDER — TRAMADOL HCL 50 MG PO TABS: 50.0000 mg | ORAL_TABLET | Freq: Three times a day (TID) | ORAL | Status: DC | PRN

## 2014-07-15 NOTE — Patient Instructions (Signed)
Use an anti-inflammatory cream such as Aspercreme or Zostrix cream twice a day to the affected area as needed. In lieu of this warm moist compresses or  hot water bottle can be used. Do not apply ice .  The Physical Therapy referral will be scheduled and you'll be notified of the time.

## 2014-07-15 NOTE — Progress Notes (Signed)
Pre visit review using our clinic review tool, if applicable. No additional management support is needed unless otherwise documented below in the visit note. 

## 2014-07-15 NOTE — Assessment & Plan Note (Addendum)
PT INR

## 2014-07-15 NOTE — Progress Notes (Signed)
   Subjective:    Patient ID: Beth Miller, female    DOB: 06/24/1941, 73 y.o.   MRN: [id number]  HPI She slipped on soap in the shower 07/07/14 landing on her buttocks. There was no head injury or definite musculoskeletal  trauma.  There was no neurologic or cardiac prodrome prior to the fall. It was a mechanical event.   Through that day she began to have progressively more severe neck pain. It was worse with rotation laterally, most dramatically with left lateral rotation. This was associated with headache which rose from the base of the neck to the crown. This did resolve but she's had residual discomfort over the posterior crown which radiates inferiorly to the neck.   She went to the ER 07/08/14. Those records were reviewed. CT of the head was negative. CT of the cervical spine revealed cervical spondylosis and left-sided arthropathy.  All labs were normal except for GFR 57 and INR 3.54. She was given Robaxin, 5 pills  to take every 6 hours.  She also states she was told to take ibuprofen 600 mg every 4 hours which she's been doing for the last week. She denies any bleeding dyscrasias despite taking ibuprofen and continuing the warfarin.       Review of Systems  Denied were any change in heart rhythm or rate prior to the event. There was no associated chest pain or shortness of breath .  Also specifically denied prior to the episode were headache, limb weakness, tingling, or numbness. No seizure activity noted.  Epistaxis, hemoptysis, hematuria, melena, or rectal bleeding denied. No unexplained weight loss, significant dyspepsia,dysphagia, or abdominal pain.  There is no abnormal bruising , bleeding, or difficulty stopping bleeding with injury.     Objective:   Physical Exam    Positive or pertinent physical findings include: She has dental staining and suggestion of caries. There is decreased cervical range of motion especially with left lateral rotation. There is  accentuation of the curvature of the thoracic spine Cardiac rhythm and rate are irregular. Gait, tone, strength are normal. The deep tendon reflexes are equal but decreased at the knees. There is no cranial nerve or neuromuscular deficit. Even after I started taking her history she continued to look @ her cell phone & play music loud enough that it was difficult to hear her responses.  General appearance :adequately nourished; in no distress. Eyes: No conjunctival inflammation or scleral icterus is present.EOMI; FOV intact Oral exam: Lips and gums are healthy appearing.There is no oropharyngeal erythema or exudate noted. No tongue deviation. Ears: negative; hearing grossly normal Heart:  Slow AF clinically. No gallop, murmur, click, rub or other extra sounds   Lungs:Chest clear to auscultation; no wheezes, rhonchi,rales ,or rubs present.No increased work of breathing.  Abdomen: bowel sounds normal, soft and non-tender without masses, organomegaly or hernias noted.  No guarding or rebound.  Skin:Warm & dry.  Intact without suspicious lesions or rashes ; no jaundice or tenting. No bruising. Lymphatic: No lymphadenopathy is noted about the head, neck, axilla           Assessment & Plan:  #1 neck pain in the setting of cervical spondylosis and left-sided arthropathy.Probable "whiplash" phenomenon  #2 significant  nonsteroidal use in the context of warfarin therapy  Plan: See orders and recommendations.

## 2014-07-16 NOTE — Assessment & Plan Note (Signed)
Discontinue nonsteroidals  Tramadol as needed only  Topical anti-inflammatory agents

## 2014-07-18 ENCOUNTER — Ambulatory Visit: Payer: Commercial Managed Care - HMO | Admitting: Internal Medicine

## 2014-08-01 ENCOUNTER — Ambulatory Visit: Payer: Medicare HMO | Attending: Internal Medicine | Admitting: Physical Therapy

## 2014-08-01 DIAGNOSIS — Z5189 Encounter for other specified aftercare: Secondary | ICD-10-CM | POA: Diagnosis not present

## 2014-08-01 DIAGNOSIS — M542 Cervicalgia: Secondary | ICD-10-CM | POA: Insufficient documentation

## 2014-08-01 DIAGNOSIS — I4891 Unspecified atrial fibrillation: Secondary | ICD-10-CM | POA: Insufficient documentation

## 2014-08-01 DIAGNOSIS — E119 Type 2 diabetes mellitus without complications: Secondary | ICD-10-CM | POA: Insufficient documentation

## 2014-08-01 DIAGNOSIS — I6529 Occlusion and stenosis of unspecified carotid artery: Secondary | ICD-10-CM | POA: Diagnosis not present

## 2014-08-01 DIAGNOSIS — I10 Essential (primary) hypertension: Secondary | ICD-10-CM | POA: Diagnosis not present

## 2014-08-01 DIAGNOSIS — M47892 Other spondylosis, cervical region: Secondary | ICD-10-CM | POA: Insufficient documentation

## 2014-08-09 ENCOUNTER — Ambulatory Visit: Payer: Medicare HMO | Admitting: Physical Therapy

## 2014-08-09 DIAGNOSIS — Z5189 Encounter for other specified aftercare: Secondary | ICD-10-CM | POA: Diagnosis not present

## 2014-08-10 ENCOUNTER — Ambulatory Visit (INDEPENDENT_AMBULATORY_CARE_PROVIDER_SITE_OTHER): Payer: Commercial Managed Care - HMO | Admitting: *Deleted

## 2014-08-10 DIAGNOSIS — Z9889 Other specified postprocedural states: Secondary | ICD-10-CM

## 2014-08-10 DIAGNOSIS — Z5181 Encounter for therapeutic drug level monitoring: Secondary | ICD-10-CM

## 2014-08-10 LAB — POCT INR: INR: 2.9

## 2014-08-15 ENCOUNTER — Encounter: Payer: Commercial Managed Care - HMO | Admitting: Physical Therapy

## 2014-08-22 ENCOUNTER — Encounter: Payer: Commercial Managed Care - HMO | Admitting: Physical Therapy

## 2014-08-29 ENCOUNTER — Ambulatory Visit: Payer: Commercial Managed Care - HMO | Admitting: Internal Medicine

## 2014-09-09 ENCOUNTER — Encounter (HOSPITAL_COMMUNITY): Payer: Self-pay | Admitting: *Deleted

## 2014-09-09 DIAGNOSIS — Z8673 Personal history of transient ischemic attack (TIA), and cerebral infarction without residual deficits: Secondary | ICD-10-CM | POA: Diagnosis not present

## 2014-09-09 DIAGNOSIS — Z87891 Personal history of nicotine dependence: Secondary | ICD-10-CM | POA: Insufficient documentation

## 2014-09-09 DIAGNOSIS — Z7951 Long term (current) use of inhaled steroids: Secondary | ICD-10-CM | POA: Diagnosis not present

## 2014-09-09 DIAGNOSIS — I10 Essential (primary) hypertension: Secondary | ICD-10-CM | POA: Diagnosis not present

## 2014-09-09 DIAGNOSIS — R51 Headache: Secondary | ICD-10-CM | POA: Insufficient documentation

## 2014-09-09 DIAGNOSIS — M542 Cervicalgia: Secondary | ICD-10-CM | POA: Insufficient documentation

## 2014-09-09 DIAGNOSIS — Z79899 Other long term (current) drug therapy: Secondary | ICD-10-CM | POA: Insufficient documentation

## 2014-09-09 DIAGNOSIS — I509 Heart failure, unspecified: Secondary | ICD-10-CM | POA: Diagnosis not present

## 2014-09-09 DIAGNOSIS — K219 Gastro-esophageal reflux disease without esophagitis: Secondary | ICD-10-CM | POA: Insufficient documentation

## 2014-09-09 DIAGNOSIS — Z7901 Long term (current) use of anticoagulants: Secondary | ICD-10-CM | POA: Insufficient documentation

## 2014-09-09 DIAGNOSIS — I4891 Unspecified atrial fibrillation: Secondary | ICD-10-CM | POA: Diagnosis not present

## 2014-09-09 DIAGNOSIS — E119 Type 2 diabetes mellitus without complications: Secondary | ICD-10-CM | POA: Insufficient documentation

## 2014-09-09 DIAGNOSIS — Z8719 Personal history of other diseases of the digestive system: Secondary | ICD-10-CM | POA: Diagnosis not present

## 2014-09-09 NOTE — ED Notes (Signed)
Pt c/o headache starting yesterday. Pt currently denies photophobia, dizziness, nausea, double vision. Pt states she fell in September but did not hit her head. Pt reports the pain is similar when she fell. Pt is on Coumadin. Pt states PT/INR levels were WNL last month.

## 2014-09-10 ENCOUNTER — Emergency Department (HOSPITAL_COMMUNITY)
Admission: EM | Admit: 2014-09-10 | Discharge: 2014-09-10 | Disposition: A | Discharge status: Home / Self Care | Payer: Medicare HMO | Attending: Emergency Medicine | Admitting: Emergency Medicine

## 2014-09-10 ENCOUNTER — Emergency Department (HOSPITAL_COMMUNITY): Payer: Medicare HMO

## 2014-09-10 ENCOUNTER — Encounter (HOSPITAL_COMMUNITY): Payer: Self-pay | Admitting: Radiology

## 2014-09-10 DIAGNOSIS — R51 Headache: Secondary | ICD-10-CM

## 2014-09-10 DIAGNOSIS — R519 Headache, unspecified: Secondary | ICD-10-CM

## 2014-09-10 DIAGNOSIS — M542 Cervicalgia: Secondary | ICD-10-CM

## 2014-09-10 LAB — CBC WITH DIFFERENTIAL/PLATELET
BASOS ABS: 0.1 10*3/uL (ref 0.0–0.1)
Basophils Relative: 1 % (ref 0–1)
EOS PCT: 1 % (ref 0–5)
Eosinophils Absolute: 0.1 10*3/uL (ref 0.0–0.7)
HCT: 38.9 % (ref 36.0–46.0)
Hemoglobin: 13 g/dL (ref 12.0–15.0)
LYMPHS PCT: 33 % (ref 12–46)
Lymphs Abs: 2.3 10*3/uL (ref 0.7–4.0)
MCH: 27.7 pg (ref 26.0–34.0)
MCHC: 33.4 g/dL (ref 30.0–36.0)
MCV: 82.9 fL (ref 78.0–100.0)
MONOS PCT: 10 % (ref 3–12)
Monocytes Absolute: 0.7 10*3/uL (ref 0.1–1.0)
NEUTROS PCT: 55 % (ref 43–77)
Neutro Abs: 3.8 10*3/uL (ref 1.7–7.7)
PLATELETS: 265 10*3/uL (ref 150–400)
RBC: 4.69 MIL/uL (ref 3.87–5.11)
RDW: 15.8 % — AB (ref 11.5–15.5)
WBC: 7 10*3/uL (ref 4.0–10.5)

## 2014-09-10 LAB — PROTIME-INR
INR: 1.78 — ABNORMAL HIGH (ref 0.00–1.49)
PROTHROMBIN TIME: 20.9 s — AB (ref 11.6–15.2)

## 2014-09-10 LAB — BASIC METABOLIC PANEL
Anion gap: 12 (ref 5–15)
BUN: 15 mg/dL (ref 6–23)
CHLORIDE: 101 meq/L (ref 96–112)
CO2: 27 meq/L (ref 19–32)
Calcium: 9.5 mg/dL (ref 8.4–10.5)
Creatinine, Ser: 1.04 mg/dL (ref 0.50–1.10)
GFR calc non Af Amer: 51 mL/min — ABNORMAL LOW (ref >90)
GFR, EST AFRICAN AMERICAN: 59 mL/min — AB (ref >90)
Glucose, Bld: 127 mg/dL — ABNORMAL HIGH (ref 70–99)
POTASSIUM: 4.4 meq/L (ref 3.7–5.3)
SODIUM: 140 meq/L (ref 137–147)

## 2014-09-10 MED ORDER — SODIUM CHLORIDE 0.9 % IV BOLUS (SEPSIS)
1000.0000 mL | Freq: Once | INTRAVENOUS | Status: AC
  Administered 2014-09-10: 1000 mL via INTRAVENOUS

## 2014-09-10 MED ORDER — DIPHENHYDRAMINE HCL 50 MG/ML IJ SOLN
50.0000 mg | Freq: Once | INTRAMUSCULAR | Status: AC
  Administered 2014-09-10: 50 mg via INTRAVENOUS
  Filled 2014-09-10: qty 1

## 2014-09-10 MED ORDER — METOCLOPRAMIDE HCL 10 MG PO TABS: 10.0000 mg | ORAL_TABLET | Freq: Every day | ORAL | Status: DC | PRN

## 2014-09-10 MED ORDER — METOCLOPRAMIDE HCL 5 MG/ML IJ SOLN
10.0000 mg | Freq: Once | INTRAMUSCULAR | Status: AC
Start: 2014-09-10 — End: 2014-09-10
  Administered 2014-09-10: 10 mg via INTRAVENOUS
  Filled 2014-09-10: qty 2

## 2014-09-10 MED ORDER — IOHEXOL 350 MG/ML SOLN
50.0000 mL | Freq: Once | INTRAVENOUS | Status: AC | PRN
  Administered 2014-09-10: 50 mL via INTRAVENOUS

## 2014-09-10 MED ORDER — ACETAMINOPHEN 500 MG PO TABS
1000.0000 mg | ORAL_TABLET | Freq: Once | ORAL | Status: AC
  Administered 2014-09-10: 1000 mg via ORAL
  Filled 2014-09-10: qty 2

## 2014-09-10 NOTE — ED Notes (Signed)
Patient transported to CT SCAN . 

## 2014-09-10 NOTE — ED Provider Notes (Signed)
CSN: [id number]     Arrival date & time 09/09/14  2124 History   First MD Initiated Contact with Patient 09/10/14 0031     Chief Complaint  Patient presents with  . Headache     (Consider location/radiation/quality/duration/timing/severity/associated sxs/prior Treatment) HPI  Beth Miller is a 73 y.o. female with past medical history of hypertension, diabetes, GERD, A. Fib on Coumadin, CHF coming in with a headache. Patient states her headache began 2 days ago. It is bilateral and radiates to the top of her head. She also has associated bilateral neck pain. She described dizziness. Her headache is only described as pain, and feels as if she slept on the wrong side of her neck. She has no blurry vision, she denies any new muscle weakness or loss of sensation. Chronic lower stemming numbness that she states may be worse now. Patient is concerned because she is on Coumadin. Her only new medication is Tylenol which she has taken over the last month for a fall. She's had no fever or recent infections. Patient's denying chest pain or shortness of breath. Patient has been compliant with her Coumadin, her last check was 2.5 and that was one month ago. Patient has no further complaints.  10 Systems reviewed and are negative for acute change except as noted in the HPI.    Past Medical History  Diagnosis Date  . Hypertension   . Diabetes mellitus   . GERD (gastroesophageal reflux disease)   . AF (atrial fibrillation)   . Chronic anticoagulation   . Chest pain   . Hematoma   . Diverticulosis   . Paresthesia   . Cataract, senile   . Disorder of oral soft tissue     of mouth  . Leg pain   . Sweating   . ALLERGIC RHINITIS   . Low back pain   . Diabetes mellitus   . Status post mitral valve replacement     St. Jude valve  . Vitamin D deficiency   . Blood transfusion without reported diagnosis   . CHF (congestive heart failure)   . Rheumatic fever   . Stroke     tia   Past Surgical  History  Procedure Laterality Date  . Abdominal hysterectomy    . Cholecystectomy    . Cardiac valve replacement  1995    Mitral valve prosthesis; st jude   Family History  Problem Relation Age of Onset  . Heart disease Brother   . Diabetes Mother   . Seizures Mother   . Diabetes Brother   . Heart disease Brother   . Pancreatic cancer Brother   . Colon cancer Neg Hx   . Other Mother     brain tumor   History  Substance Use Topics  . Smoking status: Former Smoker -- 0.02 packs/day for 60 years    Types: Cigarettes    Quit date: 10/28/2002  . Smokeless tobacco: Never Used  . Alcohol Use: No   OB History    No data available     Review of Systems    Allergies  Food; Furosemide; Quinine; Spironolactone; and Sulfadiazine  Home Medications   Prior to Admission medications   Medication Sig Start Date End Date Taking? Authorizing Provider  acetaminophen (TYLENOL) 500 MG tablet Take 1,000 mg by mouth every 6 (six) hours as needed for mild pain.   Yes Historical Provider, MD  amoxicillin (AMOXIL) 500 MG tablet Take four tablets one hour prior to dental procedure Patient taking differently:  Take 2,000 mg by mouth See admin instructions. Take four tablets one hour prior to dental procedure 10/25/13  Yes Aleksei Plotnikov V, MD  BEE POLLEN PO Take 2 capsules by mouth daily as needed (allergies).    Yes Historical Provider, MD  digoxin (DIGOX) 0.25 MG tablet Take 1 tablet (0.25 mg total) by mouth daily. 11/05/13  Yes Aleksei Plotnikov V, MD  diltiazem (CARDIZEM CD) 240 MG 24 hr capsule take 1 capsule by mouth once daily 11/05/13  Yes Aleksei Plotnikov V, MD  fluticasone (FLONASE) 50 MCG/ACT nasal spray Place 2 sprays into both nostrils daily as needed for allergies or rhinitis.   Yes Historical Provider, MD  furosemide (LASIX) 40 MG tablet Take 1 tablet (40 mg total) by mouth daily. 11/05/13  Yes Aleksei Plotnikov V, MD  glucosamine-chondroitin 500-400 MG tablet Take 1 tablet by mouth  daily as needed (joint pain).    Yes Historical Provider, MD  loratadine (CLARITIN) 10 MG tablet Take 10 mg by mouth daily as needed for allergies.    Yes Historical Provider, MD  methocarbamol (ROBAXIN) 500 MG tablet Take 1 tablet (500 mg total) by mouth every 6 (six) hours as needed for muscle spasms. 07/08/14  Yes Debby Freiberg, MD  omeprazole (PRILOSEC) 10 MG capsule Take 1 capsule (10 mg total) by mouth daily. 11/05/13 12/03/14 Yes Aleksei Plotnikov V, MD  potassium chloride SA (KLOR-CON M20) 20 MEQ tablet Take 1 tablet (20 mEq total) by mouth once. Patient taking differently: Take 20 mEq by mouth daily.  11/05/13  Yes Aleksei Plotnikov V, MD  Probiotic Product (ALIGN) 4 MG CAPS Take 1 capsule by mouth daily as needed (upset stomache).  07/17/12  Yes Lafayette Dragon, MD  vitamin B-12 (CYANOCOBALAMIN) 1000 MCG tablet Take 1,000 mcg by mouth daily as needed (energy).   Yes Historical Provider, MD  warfarin (COUMADIN) 10 MG tablet Take 1 tablet (10 mg total) by mouth daily. Patient taking differently: Take 12.5 mg by mouth daily. Tuesday, Thursday, Saturday and Sunday 11/05/13  Yes Aleksei Plotnikov V, MD  warfarin (COUMADIN) 5 MG tablet Take 1 tablet (5 mg total) by mouth daily. Patient taking differently: Take 5 mg by mouth daily. Monday, Wednesday and Friday 11/05/13  Yes Aleksei Plotnikov V, MD  Alcohol Swabs (B-D SINGLE USE SWABS REGULAR) PADS 1 each by Does not apply route daily. 11/05/13   Aleksei Plotnikov V, MD  Blood Glucose Monitoring Suppl (ACCU-CHEK AVIVA) device Use as instructed 11/05/13 11/05/14  Lew Dawes V, MD  Cholecalciferol (VITAMIN D3) 1000 UNIT tablet Take 1,000 Units by mouth daily.      Historical Provider, MD  fluticasone (FLONASE) 50 MCG/ACT nasal spray Place 2 sprays into the nose daily. Patient taking differently: Place 2 sprays into the nose daily as needed for allergies.  07/20/13   Aleksei Plotnikov V, MD  glucose blood (ACCU-CHEK AVIVA PLUS) test strip Use as instructed 11/05/13    Aleksei Plotnikov V, MD  Lancets (ACCU-CHEK SOFT TOUCH) lancets Use as instructed 11/05/13   Aleksei Plotnikov V, MD  Omega-3 Fatty Acids (FISH OIL) 1000 MG CAPS Take 1 capsule by mouth daily.      Historical Provider, MD  promethazine (PHENERGAN) 25 MG tablet Take 25 mg by mouth every 6 (six) hours as needed. 02/16/12 08/27/13  Veryl Speak, MD  traMADol (ULTRAM) 50 MG tablet Take 1 tablet (50 mg total) by mouth every 8 (eight) hours as needed. 07/15/14   Hendricks Limes, MD   BP 131/97 mmHg  Pulse 83  Temp(Src) 98.2 F (36.8 C) (Oral)  Resp 16  SpO2 99% Physical Exam  Constitutional: She is oriented to person, place, and time. She appears well-developed and well-nourished. No distress.  HENT:  Head: Normocephalic and atraumatic.  Nose: Nose normal.  Mouth/Throat: Oropharynx is clear and moist. No oropharyngeal exudate.  Eyes: Conjunctivae and EOM are normal. Pupils are equal, round, and reactive to light. No scleral icterus.  Neck: Normal range of motion. Neck supple. No JVD present. No tracheal deviation present. No thyromegaly present.  Cardiovascular: Normal rate, regular rhythm and normal heart sounds.  Exam reveals no gallop and no friction rub.   No murmur heard. Pulmonary/Chest: Effort normal and breath sounds normal. No respiratory distress. She has no wheezes. She exhibits no tenderness.  Abdominal: Soft. Bowel sounds are normal. She exhibits no distension and no mass. There is no tenderness. There is no rebound and no guarding.  Musculoskeletal: Normal range of motion. She exhibits no edema or tenderness.  Lymphadenopathy:    She has no cervical adenopathy.  Neurological: She is alert and oriented to person, place, and time. No cranial nerve deficit. She exhibits normal muscle tone.  Normal strength and sensation 4 strength. Normal cerebellar testing and gait.  Skin: Skin is warm and dry. No rash noted. She is not diaphoretic. No erythema. No pallor.  Nursing note and vitals  reviewed.   ED Course  Procedures (including critical care time) Labs Review Labs Reviewed  CBC WITH DIFFERENTIAL - Abnormal; Notable for the following:    RDW 15.8 (*)    All other components within normal limits  BASIC METABOLIC PANEL - Abnormal; Notable for the following:    Glucose, Bld 127 (*)    GFR calc non Af Amer 51 (*)    GFR calc Af Amer 59 (*)    All other components within normal limits  PROTIME-INR - Abnormal; Notable for the following:    Prothrombin Time 20.9 (*)    INR 1.78 (*)    All other components within normal limits    Imaging Review Ct Angio Head W/cm &/or Wo Cm  09/10/2014   CLINICAL DATA:  Headache for 2 days, history of fall in September. Patient is on Coumadin.  EXAM: CT ANGIOGRAPHY HEAD  TECHNIQUE: Multidetector CT imaging of the head was performed using the standard protocol during bolus administration of intravenous contrast. Multiplanar CT image reconstructions and MIPs were obtained to evaluate the vascular anatomy.  CONTRAST:  6mL OMNIPAQUE IOHEXOL 350 MG/ML SOLN  COMPARISON:  CT of the head September 07, 2014  FINDINGS: CT HEAD:  The ventricles and sulci are normal for age. No intraparenchymal hemorrhage, mass effect nor midline shift. Patchy supratentorial white matter hypodensities are less than expected for patient's age and though non-specific suggest sequelae of chronic small vessel ischemic disease. No acute large vascular territory infarcts. Minimal higher RIGHT greater LEFT frontal encephalomalacia, unchanged. No abnormal extra-axial fluid collections. Basal cisterns are patent. Mild calcific atherosclerosis of the carotid siphons. No abnormal parenchymal nor extra-axial enhancement.  No skull fracture. The included ocular globes and orbital contents are non-suspicious. Status post apparent bilateral ocular lens implants. The mastoid aircells and included paranasal sinuses are well-aerated.  CTA HEAD:  Anterior circulation: Normal appearance of the  cervical internal carotid arteries, petrous, cavernous and supra clinoid internal carotid arteries. Widely patent anterior communicating artery. Normal appearance of the anterior and middle cerebral arteries.  Posterior circulation: LEFT vertebral artery is dominant with normal appearance of the  vertebral arteries, vertebrobasilar junction and basilar artery, as well as main branch vessels. Normal appearance of the posterior cerebral arteries.  No large vessel occlusion, hemodynamically significant stenosis, dissection, luminal irregularity, contrast extravasation or aneurysm within the anterior nor posterior circulation.  Though not tailored for evaluation, dural venous sinuses appear patent.  Review of the MIP images confirms the above findings.  IMPRESSION: CT HEAD: No acute intracranial process; normal noncontrast CT of the head for age.  CTA HEAD:  Normal CT angiogram of the head.   Electronically Signed   By: Elon Alas   On: 09/10/2014 02:28     EKG Interpretation None      MDM   Final diagnoses:  Headache  Neck pain  Neck pain    ppatient does emergency emergency department for new onset headache on Coumadin. Her neurological exam is normal. Her ionized up and tested for one month. Will obtain labs. Patient will need CT scan as well to evaluate for any intracerebral hemorrhage. She was given Tylenol Reglan and Benadryl for pain relief.  Upon repeat assessment, patient's headache has improved. INR is 1.8. CT scan does not reveal any acute bleed.   Her INR is not markedly elevated. I do not believe the patient has any intracranial bleeding. She'll be advised to continue coumadin therpy and follow with her primary care physician within 3 days for continued treatment of her headache and continued evaluation of her Coumadin therapy. Her vital signs remain within her normal limits and she is safe for discharge.    Everlene Balls, MD 09/10/14 1351

## 2014-09-10 NOTE — Discharge Instructions (Signed)
General Headache Without Cause Beth Miller, you were seen today for headache. Her CT scan was negative for any bleeding. Your INR level is 1.78. Continue to take her Coumadin as prescribed.Follow-up with her primary care physician within 3 days for continued treatment of her headache and for repeat check of your INR. If any of her symptoms worsen come back to emergency department immediately for repeat evaluation. Thank you. A general headache is pain or discomfort felt around the head or neck area. The cause may not be found.  HOME CARE   Keep all doctor visits.  Only take medicines as told by your doctor.  Lie down in a dark, quiet room when you have a headache.  Keep a journal to find out if certain things bring on headaches. For example, write down:  What you eat and drink.  How much sleep you get.  Any change to your diet or medicines.  Relax by getting a massage or doing other relaxing activities.  Put ice or heat packs on the head and neck area as told by your doctor.  Lessen stress.  Sit up straight. Do not tighten (tense) your muscles.  Quit smoking if you smoke.  Lessen how much alcohol you drink.  Lessen how much caffeine you drink, or stop drinking caffeine.  Eat and sleep on a regular schedule.  Get 7 to 9 hours of sleep, or as told by your doctor.  Keep lights dim if bright lights bother you or make your headaches worse. GET HELP RIGHT AWAY IF:   Your headache becomes really bad.  You have a fever.  You have a stiff neck.  You have trouble seeing.  Your muscles are weak, or you lose muscle control.  You lose your balance or have trouble walking.  You feel like you will pass out (faint), or you pass out.  You have really bad symptoms that are different than your first symptoms.  You have problems with the medicines given to you by your doctor.  Your medicines do not work.  Your headache feels different than the other headaches.  You feel sick  to your stomach (nauseous) or throw up (vomit). MAKE SURE YOU:   Understand these instructions.  Will watch your condition.  Will get help right away if you are not doing well or get worse. Document Released: 07/23/2008 Document Revised: 01/06/2012 Document Reviewed: 10/04/2011 [facility] Patient Information 2015 [place], [place]. This information is not intended to replace advice given to you by your health care provider. Make sure you discuss any questions you have with your health care provider.

## 2014-09-21 ENCOUNTER — Telehealth: Payer: Self-pay

## 2014-09-21 NOTE — Telephone Encounter (Signed)
Called and left a message for call back.  Pt missed appointment today (09/21/14).  Needs to reschedule.

## 2014-10-25 ENCOUNTER — Ambulatory Visit: Payer: Commercial Managed Care - HMO | Admitting: Internal Medicine

## 2014-10-29 DIAGNOSIS — G4733 Obstructive sleep apnea (adult) (pediatric): Secondary | ICD-10-CM | POA: Diagnosis not present

## 2014-11-11 ENCOUNTER — Encounter: Payer: Self-pay | Admitting: Internal Medicine

## 2014-11-11 ENCOUNTER — Ambulatory Visit (INDEPENDENT_AMBULATORY_CARE_PROVIDER_SITE_OTHER): Payer: Commercial Managed Care - HMO | Admitting: Internal Medicine

## 2014-11-11 ENCOUNTER — Telehealth: Payer: Self-pay | Admitting: Internal Medicine

## 2014-11-11 VITALS — BP 140/76 | HR 73 | Temp 97.4°F | Wt 182.0 lb

## 2014-11-11 DIAGNOSIS — M542 Cervicalgia: Secondary | ICD-10-CM

## 2014-11-11 DIAGNOSIS — Z9889 Other specified postprocedural states: Secondary | ICD-10-CM

## 2014-11-11 DIAGNOSIS — I10 Essential (primary) hypertension: Secondary | ICD-10-CM

## 2014-11-11 DIAGNOSIS — E119 Type 2 diabetes mellitus without complications: Secondary | ICD-10-CM

## 2014-11-11 DIAGNOSIS — Z23 Encounter for immunization: Secondary | ICD-10-CM

## 2014-11-11 MED ORDER — METHOCARBAMOL 500 MG PO TABS: 500.0000 mg | ORAL_TABLET | Freq: Four times a day (QID) | ORAL | Status: DC | PRN

## 2014-11-11 NOTE — Assessment & Plan Note (Signed)
11/15 s/p fall in the bathtub

## 2014-11-11 NOTE — Assessment & Plan Note (Signed)
Chronic. 

## 2014-11-11 NOTE — Assessment & Plan Note (Signed)
[facility]in Clinic - Coumadin Rx

## 2014-11-11 NOTE — Progress Notes (Signed)
   Subjective:    HPI   Patient fell in the tub on 09/07/14. She had a HA bilateral and radiates to the top of her head. She also had associated bilateral neck pain. She described dizziness.  F/u memory issues, forgetful - better  The patient presents for a follow-up of  chronic hypertension, chronic dyslipidemia, anticoagulation, controlled with medicines. F/u OA, R knee pain  Wt Readings from Last 3 Encounters:  11/11/14 182 lb (82.555 kg)  07/15/14 191 lb 6 oz (86.807 kg)  07/08/14 181 lb (82.101 kg)   BP Readings from Last 3 Encounters:  11/11/14 140/76  09/10/14 126/82  07/15/14 120/80     Review of Systems  Constitutional: Negative for chills, activity change, appetite change, fatigue and unexpected weight change.  HENT: Negative for congestion, mouth sores and sinus pressure.   Eyes: Negative for visual disturbance.  Respiratory: Negative for cough and chest tightness.   Gastrointestinal: Negative for nausea and abdominal pain.  Genitourinary: Negative for frequency, difficulty urinating and vaginal pain.  Musculoskeletal: Positive for arthralgias (R knee). Negative for back pain and gait problem.  Skin: Negative for pallor and rash.  Neurological: Negative for dizziness, tremors, weakness, numbness and headaches.  Psychiatric/Behavioral: Negative for confusion and sleep disturbance.       Objective:   Physical Exam  Constitutional: She appears well-developed. No distress.  obese  HENT:  Head: Normocephalic.  Right Ear: External ear normal.  Left Ear: External ear normal.  Nose: Nose normal.  Mouth/Throat: Oropharynx is clear and moist.  Eyes: Conjunctivae are normal. Pupils are equal, round, and reactive to light. Right eye exhibits no discharge. Left eye exhibits no discharge.  Neck: Normal range of motion. Neck supple. No JVD present. No tracheal deviation present. No thyromegaly present.  Cardiovascular: Normal heart sounds.   No murmur heard. Irregular  rate click  Pulmonary/Chest: No stridor. No respiratory distress. She has no wheezes.  Abdominal: Soft. Bowel sounds are normal. She exhibits no distension and no mass. There is no tenderness. There is no rebound and no guarding.  Musculoskeletal: She exhibits tenderness (R knee w/mild tenderness). She exhibits no edema.  Lymphadenopathy:    She has no cervical adenopathy.  Neurological: She displays normal reflexes. No cranial nerve deficit. She exhibits normal muscle tone. Coordination normal.  Skin: No rash noted. No erythema.  Psychiatric: She has a normal mood and affect. Her behavior is normal. Judgment and thought content normal.  L neck parasp muscles hurt w/palpation  Lab Results  Component Value Date   WBC 7.0 09/10/2014   HGB 13.0 09/10/2014   HCT 38.9 09/10/2014   PLT 265 09/10/2014   GLUCOSE 127* 09/10/2014   CHOL 116 04/28/2014   TRIG 171.0* 04/28/2014   HDL 38.80* 04/28/2014   LDLCALC 43 04/28/2014   ALT 20 04/28/2014   AST 25 04/28/2014   NA 140 09/10/2014   K 4.4 09/10/2014   CL 101 09/10/2014   CREATININE 1.04 09/10/2014   BUN 15 09/10/2014   CO2 27 09/10/2014   TSH 2.60 04/28/2014   INR 1.78* 09/10/2014   HGBA1C 6.7* 04/28/2014    CT/CTA head 09/10/14 IMPRESSION: CT HEAD: No acute intracranial process; normal noncontrast CT of the head for age.  CTA HEAD: Normal CT angiogram of the head.      Assessment & Plan:  Patient ID: Beth Miller, female   DOB: October 25, 1941, 74 y.o.   MRN: [id number]

## 2014-11-11 NOTE — Telephone Encounter (Signed)
emmi emailed °

## 2014-11-11 NOTE — Assessment & Plan Note (Signed)
Continue with current prescription therapy as reflected on the Med list.  

## 2014-11-11 NOTE — Progress Notes (Signed)
Pre visit review using our clinic review tool, if applicable. No additional management support is needed unless otherwise documented below in the visit note. 

## 2014-11-16 ENCOUNTER — Telehealth: Payer: Self-pay

## 2014-11-16 ENCOUNTER — Ambulatory Visit (INDEPENDENT_AMBULATORY_CARE_PROVIDER_SITE_OTHER): Payer: Commercial Managed Care - HMO

## 2014-11-16 ENCOUNTER — Other Ambulatory Visit (INDEPENDENT_AMBULATORY_CARE_PROVIDER_SITE_OTHER): Payer: Commercial Managed Care - HMO

## 2014-11-16 DIAGNOSIS — Z5181 Encounter for therapeutic drug level monitoring: Secondary | ICD-10-CM

## 2014-11-16 DIAGNOSIS — M542 Cervicalgia: Secondary | ICD-10-CM

## 2014-11-16 DIAGNOSIS — E119 Type 2 diabetes mellitus without complications: Secondary | ICD-10-CM

## 2014-11-16 DIAGNOSIS — Z9889 Other specified postprocedural states: Secondary | ICD-10-CM | POA: Diagnosis not present

## 2014-11-16 DIAGNOSIS — I10 Essential (primary) hypertension: Secondary | ICD-10-CM | POA: Diagnosis not present

## 2014-11-16 LAB — LIPID PANEL
CHOLESTEROL: 102 mg/dL (ref 0–200)
HDL: 42.8 mg/dL (ref >39.00)
LDL Cholesterol: 29 mg/dL (ref 0–99)
NONHDL: 59.2
Total CHOL/HDL Ratio: 2
Triglycerides: 149 mg/dL (ref 0.0–149.0)
VLDL: 29.8 mg/dL (ref 0.0–40.0)

## 2014-11-16 LAB — URINALYSIS, ROUTINE W REFLEX MICROSCOPIC
Bilirubin Urine: NEGATIVE
KETONES UR: NEGATIVE
Leukocytes, UA: NEGATIVE
Nitrite: NEGATIVE
Specific Gravity, Urine: 1.01 (ref 1.000–1.030)
Total Protein, Urine: NEGATIVE
URINE GLUCOSE: NEGATIVE
Urobilinogen, UA: 0.2 (ref 0.0–1.0)
pH: 5.5 (ref 5.0–8.0)

## 2014-11-16 LAB — BASIC METABOLIC PANEL
BUN: 18 mg/dL (ref 6–23)
CHLORIDE: 105 meq/L (ref 96–112)
CO2: 32 mEq/L (ref 19–32)
Calcium: 9.6 mg/dL (ref 8.4–10.5)
Creatinine, Ser: 0.97 mg/dL (ref 0.40–1.20)
GFR: 71.95 mL/min (ref >60.00)
GLUCOSE: 96 mg/dL (ref 70–99)
Potassium: 4.3 mEq/L (ref 3.5–5.1)
Sodium: 139 mEq/L (ref 135–145)

## 2014-11-16 LAB — CBC WITH DIFFERENTIAL/PLATELET
BASOS PCT: 0.7 % (ref 0.0–3.0)
Basophils Absolute: 0 10*3/uL (ref 0.0–0.1)
Eosinophils Absolute: 0.1 10*3/uL (ref 0.0–0.7)
Eosinophils Relative: 1.8 % (ref 0.0–5.0)
HEMATOCRIT: 37.9 % (ref 36.0–46.0)
Hemoglobin: 12.8 g/dL (ref 12.0–15.0)
LYMPHS ABS: 2.2 10*3/uL (ref 0.7–4.0)
Lymphocytes Relative: 36.3 % (ref 12.0–46.0)
MCHC: 33.8 g/dL (ref 30.0–36.0)
MCV: 83.7 fl (ref 78.0–100.0)
MONO ABS: 0.8 10*3/uL (ref 0.1–1.0)
MONOS PCT: 13.9 % — AB (ref 3.0–12.0)
Neutro Abs: 2.8 10*3/uL (ref 1.4–7.7)
Neutrophils Relative %: 47.3 % (ref 43.0–77.0)
Platelets: 288 10*3/uL (ref 150.0–400.0)
RBC: 4.53 Mil/uL (ref 3.87–5.11)
RDW: 16.2 % — ABNORMAL HIGH (ref 11.5–15.5)
WBC: 6 10*3/uL (ref 4.0–10.5)

## 2014-11-16 LAB — HEMOGLOBIN A1C: Hgb A1c MFr Bld: 6.5 % (ref 4.6–6.5)

## 2014-11-16 LAB — POCT INR: INR: 3

## 2014-11-16 LAB — TSH: TSH: 3.19 u[IU]/mL (ref 0.35–4.50)

## 2014-11-16 NOTE — Telephone Encounter (Signed)
[employer] faxed a formulary change for this patient.   Methocarbamol 500 mg tab is not covered.   Alternative listed is: tizanidine tablets or baclofen.   Please advise if the change is appropriate.

## 2014-11-17 MED ORDER — TIZANIDINE HCL 4 MG PO TABS: 4.0000 mg | ORAL_TABLET | Freq: Three times a day (TID) | ORAL | Status: DC | PRN

## 2014-11-17 NOTE — Telephone Encounter (Signed)
Ok Tizanidine instead  Thx

## 2014-11-17 NOTE — Addendum Note (Signed)
Addended by: Cassandria Anger on: 11/17/2014 05:32 PM   Modules accepted: Orders, Medications

## 2014-11-21 ENCOUNTER — Other Ambulatory Visit: Payer: Self-pay | Admitting: Internal Medicine

## 2014-11-21 NOTE — Telephone Encounter (Signed)
Dose, Strength and # please.

## 2014-11-24 NOTE — Telephone Encounter (Signed)
Pls see meds Thx

## 2014-11-29 DIAGNOSIS — G4733 Obstructive sleep apnea (adult) (pediatric): Secondary | ICD-10-CM | POA: Diagnosis not present

## 2014-12-12 ENCOUNTER — Other Ambulatory Visit: Payer: Self-pay | Admitting: Internal Medicine

## 2014-12-28 DIAGNOSIS — G4733 Obstructive sleep apnea (adult) (pediatric): Secondary | ICD-10-CM | POA: Diagnosis not present

## 2015-01-28 DIAGNOSIS — G4733 Obstructive sleep apnea (adult) (pediatric): Secondary | ICD-10-CM | POA: Diagnosis not present

## 2015-02-13 ENCOUNTER — Ambulatory Visit: Payer: Commercial Managed Care - HMO | Admitting: Internal Medicine

## 2015-02-13 DIAGNOSIS — Z0289 Encounter for other administrative examinations: Secondary | ICD-10-CM

## 2015-03-01 ENCOUNTER — Other Ambulatory Visit: Payer: Self-pay | Admitting: Internal Medicine

## 2015-03-15 ENCOUNTER — Ambulatory Visit (INDEPENDENT_AMBULATORY_CARE_PROVIDER_SITE_OTHER): Payer: Commercial Managed Care - HMO | Admitting: General Practice

## 2015-03-15 DIAGNOSIS — I4891 Unspecified atrial fibrillation: Secondary | ICD-10-CM

## 2015-03-15 DIAGNOSIS — Z5181 Encounter for therapeutic drug level monitoring: Secondary | ICD-10-CM | POA: Diagnosis not present

## 2015-03-15 DIAGNOSIS — Z9889 Other specified postprocedural states: Secondary | ICD-10-CM | POA: Diagnosis not present

## 2015-03-15 LAB — POCT INR: INR: 1.6

## 2015-03-15 NOTE — Progress Notes (Signed)
Agree with plan 

## 2015-03-15 NOTE — Progress Notes (Signed)
Pre visit review using our clinic review tool, if applicable. No additional management support is needed unless otherwise documented below in the visit note. 

## 2015-03-26 NOTE — Progress Notes (Signed)
Patient ID: Beth Miller, female   DOB: Nov 02, 1940, 74 y.o.   MRN: [id number] Ms. Mcgaugh is seen for f/u MVR  Has multiple issues which include a prosthetic MV - dating back to 1995, chronic atrial fib, on coumadin and carotid disease. She is obese. Past smoker and she is diabetic.   F/U myovue done 01/18/13 normal no gating due to afib  Echo 01/13/13 normal EF and MVR normal Study Conclusions  - Left ventricle: The cavity size was normal. Wall thickness was increased in a pattern of mild LVH. The estimated ejection fraction was 55%. Wall motion was normal; there were no regional wall motion abnormalities. - Aortic valve: The valve appears to be grossly normal. Mild regurgitation. - Mitral valve: Mechanical mitral prosthesis is working well. Mean gradient: 5mm Hg (D). Peak gradient: 72mm Hg (D). - Left atrium: The atrium was moderately dilated. - Right ventricle: The cavity size was mildly dilated. Systolic function was mildly reduced. - Right atrium: The atrium was moderately dilated. - Tricuspid valve: Mild-moderate regurgitation. - Pulmonary arteries: PA peak pressure: 77mm Hg (S).  Carotid 04/06/14 plaque no stenosis f/u 6/17 Multinodular goiter  Biopsy ok    ROS: Denies fever, malais, weight loss, blurry vision, decreased visual acuity, cough, sputum, SOB, hemoptysis, pleuritic pain, palpitaitons, heartburn, abdominal pain, melena, lower extremity edema, claudication, or rash.  All other systems reviewed and negative  General: Affect appropriate Healthy:  appears stated age 31: normal Neck supple with no adenopathy JVP normal right  bruits no thyromegaly Lungs clear with no wheezing and good diaphragmatic motion Heart:  H9/ click S2 no murmur, no rub, gallop or click PMI normal Abdomen: benighn, BS positve, no tenderness, no AAA no bruit.  No HSM or HJR Distal pulses intact with no bruits No edema Neuro non-focal Skin warm and dry No muscular weakness   Current  Outpatient Prescriptions  Medication Sig Dispense Refill  . acetaminophen (TYLENOL) 500 MG tablet Take 1,000 mg by mouth every 6 (six) hours as needed for mild pain.    . Alcohol Swabs (B-D SINGLE USE SWABS REGULAR) PADS 1 each by Does not apply route daily. 100 each 3  . amoxicillin (AMOXIL) 500 MG tablet Take four tablets one hour prior to dental procedure (Patient taking differently: Take 2,000 mg by mouth See admin instructions. Take four tablets one hour prior to dental procedure) 4 tablet 6  . BEE POLLEN PO Take 2 capsules by mouth daily as needed (allergies).     . Cholecalciferol (VITAMIN D3) 1000 UNIT tablet Take 1,000 Units by mouth daily.      . digoxin (LANOXIN) 0.25 MG tablet TAKE 1 TABLET EVERY DAY 90 tablet 3  . fluticasone (FLONASE) 50 MCG/ACT nasal spray Place 2 sprays into the nose daily. (Patient taking differently: Place 2 sprays into the nose daily as needed for allergies. ) 16 g 6  . fluticasone (FLONASE) 50 MCG/ACT nasal spray Place 2 sprays into both nostrils daily as needed for allergies or rhinitis.    . furosemide (LASIX) 40 MG tablet TAKE 1 TABLET EVERY DAY 90 tablet 3  . glucosamine-chondroitin 500-400 MG tablet Take 1 tablet by mouth daily as needed (joint pain).     Marland Kitchen glucose blood (ACCU-CHEK AVIVA PLUS) test strip Use as instructed 100 each 12  . Lancets (ACCU-CHEK SOFT TOUCH) lancets Use as instructed 100 each 3  . loratadine (CLARITIN) 10 MG tablet Take 10 mg by mouth daily as needed for allergies.     Marland Kitchen  metoCLOPramide (REGLAN) 10 MG tablet Take 1 tablet (10 mg total) by mouth daily as needed (headache). 6 tablet 0  . Omega-3 Fatty Acids (FISH OIL) 1000 MG CAPS Take 1 capsule by mouth daily.      Marland Kitchen omeprazole (PRILOSEC) 10 MG capsule TAKE 1 CAPSULE EVERY DAY 90 capsule 3  . potassium chloride SA (K-DUR,KLOR-CON) 20 MEQ tablet TAKE 1 TABLET EVERY DAY 90 tablet 3  . Probiotic Product (ALIGN) 4 MG CAPS Take 1 capsule by mouth daily as needed (upset stomache).     Marland Kitchen  tiZANidine (ZANAFLEX) 4 MG tablet TAKE 1 TABLET EVERY 8 HOURS AS NEEDED FOR MUSCLE SPASMS 90 tablet 0  . traMADol (ULTRAM) 50 MG tablet Take 1 tablet (50 mg total) by mouth every 8 (eight) hours as needed. 30 tablet 0  . vitamin B-12 (CYANOCOBALAMIN) 1000 MCG tablet Take 1,000 mcg by mouth daily as needed (energy).    . warfarin (COUMADIN) 10 MG tablet TAKE 1 TABLET EVERY DAY 90 tablet 3  . warfarin (COUMADIN) 5 MG tablet Take 1 tablet (5 mg total) by mouth daily. (Patient taking differently: Take 5 mg by mouth daily. Monday, Wednesday and Friday) 90 tablet 3  . promethazine (PHENERGAN) 25 MG tablet Take 25 mg by mouth every 6 (six) hours as needed.     No current facility-administered medications for this visit.    Allergies  Food; Furosemide; Quinine; Spironolactone; and Sulfadiazine  Electrocardiogram: 03/04/14  Afib rate 99 nonspecific ST/T wave change rate a little higher than  2014  At 64 03/29/15 afib rate 119  Nonspecific St changes   Assessment and Plan Afib:  Good anticoagulaiton seems to be forgetting to take meds from time to time Rate control inadequate  D/c cardizem and try lopressor 50 bid Anticoagulaiton:  INR RX no bleeding issues  Consider lovenox bridging if stopped due to afib and mechanical MVR Goiter:  Stable biopsy negative for tumor HTN:  Well controlled.  Continue current medications and low sodium Dash type diet.    F/U with me next available No tests Cardizem d/c lopressor 50 bid added   Jenkins Rouge

## 2015-03-29 ENCOUNTER — Encounter: Payer: Self-pay | Admitting: Cardiovascular Disease

## 2015-03-29 ENCOUNTER — Ambulatory Visit (INDEPENDENT_AMBULATORY_CARE_PROVIDER_SITE_OTHER): Payer: Commercial Managed Care - HMO | Admitting: Cardiovascular Disease

## 2015-03-29 VITALS — BP 120/78 | HR 119 | Ht 62.0 in | Wt 171.0 lb

## 2015-03-29 DIAGNOSIS — R000 Tachycardia, unspecified: Secondary | ICD-10-CM

## 2015-03-29 MED ORDER — METOPROLOL TARTRATE 50 MG PO TABS: 50.0000 mg | ORAL_TABLET | Freq: Two times a day (BID) | ORAL | Status: DC

## 2015-03-29 NOTE — Patient Instructions (Signed)
Medication Instructions:  STOP DILTIAZEM START METOPROLOL  50 MG  TWICE DAILY  Labwork: NONE  Testing/Procedures: NONE  Follow-Up: Your physician recommends that you schedule a follow-up appointment in:  NEXT  AVAILABLE WITH DR Johnsie Cancel  Any Other Special Instructions Will Be Listed Below (If Applicable).

## 2015-04-12 ENCOUNTER — Ambulatory Visit (INDEPENDENT_AMBULATORY_CARE_PROVIDER_SITE_OTHER): Payer: Commercial Managed Care - HMO | Admitting: Internal Medicine

## 2015-04-12 ENCOUNTER — Encounter: Payer: Self-pay | Admitting: Internal Medicine

## 2015-04-12 ENCOUNTER — Other Ambulatory Visit (INDEPENDENT_AMBULATORY_CARE_PROVIDER_SITE_OTHER): Payer: Commercial Managed Care - HMO

## 2015-04-12 ENCOUNTER — Ambulatory Visit (INDEPENDENT_AMBULATORY_CARE_PROVIDER_SITE_OTHER): Payer: Commercial Managed Care - HMO | Admitting: *Deleted

## 2015-04-12 VITALS — BP 112/62 | HR 93 | Temp 98.0°F | Resp 12 | Ht 62.0 in | Wt 171.0 lb

## 2015-04-12 DIAGNOSIS — M542 Cervicalgia: Secondary | ICD-10-CM

## 2015-04-12 DIAGNOSIS — L259 Unspecified contact dermatitis, unspecified cause: Secondary | ICD-10-CM

## 2015-04-12 DIAGNOSIS — E119 Type 2 diabetes mellitus without complications: Secondary | ICD-10-CM | POA: Diagnosis not present

## 2015-04-12 DIAGNOSIS — Z9889 Other specified postprocedural states: Secondary | ICD-10-CM

## 2015-04-12 DIAGNOSIS — G459 Transient cerebral ischemic attack, unspecified: Secondary | ICD-10-CM

## 2015-04-12 DIAGNOSIS — I10 Essential (primary) hypertension: Secondary | ICD-10-CM

## 2015-04-12 DIAGNOSIS — I4891 Unspecified atrial fibrillation: Secondary | ICD-10-CM

## 2015-04-12 DIAGNOSIS — Z5181 Encounter for therapeutic drug level monitoring: Secondary | ICD-10-CM

## 2015-04-12 LAB — HEPATIC FUNCTION PANEL
ALT: 18 U/L (ref 0–35)
AST: 22 U/L (ref 0–37)
Albumin: 4.2 g/dL (ref 3.5–5.2)
Alkaline Phosphatase: 71 U/L (ref 39–117)
BILIRUBIN TOTAL: 0.7 mg/dL (ref 0.2–1.2)
Bilirubin, Direct: 0.2 mg/dL (ref 0.0–0.3)
TOTAL PROTEIN: 7.6 g/dL (ref 6.0–8.3)

## 2015-04-12 LAB — BASIC METABOLIC PANEL
BUN: 16 mg/dL (ref 6–23)
CALCIUM: 9.8 mg/dL (ref 8.4–10.5)
CO2: 31 mEq/L (ref 19–32)
Chloride: 104 mEq/L (ref 96–112)
Creatinine, Ser: 0.99 mg/dL (ref 0.40–1.20)
GFR: 70.2 mL/min (ref >60.00)
GLUCOSE: 102 mg/dL — AB (ref 70–99)
Potassium: 4.2 mEq/L (ref 3.5–5.1)
Sodium: 139 mEq/L (ref 135–145)

## 2015-04-12 LAB — CBC WITH DIFFERENTIAL/PLATELET
BASOS ABS: 0 10*3/uL (ref 0.0–0.1)
Basophils Relative: 1 % (ref 0.0–3.0)
Eosinophils Absolute: 0.1 10*3/uL (ref 0.0–0.7)
Eosinophils Relative: 1.6 % (ref 0.0–5.0)
HEMATOCRIT: 39.2 % (ref 36.0–46.0)
HEMOGLOBIN: 13.1 g/dL (ref 12.0–15.0)
Lymphocytes Relative: 30.1 % (ref 12.0–46.0)
Lymphs Abs: 1.5 10*3/uL (ref 0.7–4.0)
MCHC: 33.3 g/dL (ref 30.0–36.0)
MCV: 83.7 fl (ref 78.0–100.0)
MONO ABS: 0.7 10*3/uL (ref 0.1–1.0)
Monocytes Relative: 13.7 % — ABNORMAL HIGH (ref 3.0–12.0)
NEUTROS ABS: 2.6 10*3/uL (ref 1.4–7.7)
Neutrophils Relative %: 53.6 % (ref 43.0–77.0)
Platelets: 289 10*3/uL (ref 150.0–400.0)
RBC: 4.69 Mil/uL (ref 3.87–5.11)
RDW: 15.5 % (ref 11.5–15.5)
WBC: 4.8 10*3/uL (ref 4.0–10.5)

## 2015-04-12 LAB — LIPID PANEL
Cholesterol: 105 mg/dL (ref 0–200)
HDL: 38.5 mg/dL — AB (ref >39.00)
LDL CALC: 46 mg/dL (ref 0–99)
NONHDL: 66.5
Total CHOL/HDL Ratio: 3
Triglycerides: 105 mg/dL (ref 0.0–149.0)
VLDL: 21 mg/dL (ref 0.0–40.0)

## 2015-04-12 LAB — POCT INR: INR: 3.1

## 2015-04-12 LAB — HEMOGLOBIN A1C: Hgb A1c MFr Bld: 5.9 % (ref 4.6–6.5)

## 2015-04-12 LAB — TSH: TSH: 2.37 u[IU]/mL (ref 0.35–4.50)

## 2015-04-12 MED ORDER — TIZANIDINE HCL 4 MG PO TABS: ORAL_TABLET | ORAL | Status: DC

## 2015-04-12 MED ORDER — AMOXICILLIN 500 MG PO TABS: ORAL_TABLET | ORAL | Status: DC

## 2015-04-12 MED ORDER — TRIAMCINOLONE ACETONIDE 0.5 % EX CREA: 1.0000 "application " | TOPICAL_CREAM | Freq: Three times a day (TID) | CUTANEOUS | Status: DC

## 2015-04-12 NOTE — Assessment & Plan Note (Addendum)
  On Toprol, Furosemide 

## 2015-04-12 NOTE — Progress Notes (Signed)
I have reviewed and agree with the plan. 

## 2015-04-12 NOTE — Assessment & Plan Note (Signed)
Nickel contact Triamc cream prn

## 2015-04-12 NOTE — Progress Notes (Signed)
Subjective:    HPI  C/o L shoulder pain x 1-2 mo - pt is using Glucosamine  C/o nickel dermatitis rash from a watch   Patient fell in the tub on 09/07/14. She had a HA bilateral and radiates to the top of her head. She also had associated bilateral neck pain. She described dizziness.  F/u memory issues, forgetful - better  The patient presents for a follow-up of  chronic hypertension, chronic dyslipidemia, anticoagulation, controlled with medicines. F/u OA, R knee pain  Wt Readings from Last 3 Encounters:  04/12/15 171 lb (77.565 kg)  03/29/15 171 lb (77.565 kg)  11/11/14 182 lb (82.555 kg)   BP Readings from Last 3 Encounters:  04/12/15 112/62  03/29/15 120/78  11/11/14 140/76     Review of Systems  Constitutional: Negative for chills, activity change, appetite change, fatigue and unexpected weight change.  HENT: Negative for congestion, mouth sores and sinus pressure.   Eyes: Negative for visual disturbance.  Respiratory: Negative for cough and chest tightness.   Gastrointestinal: Negative for nausea and abdominal pain.  Genitourinary: Negative for frequency, difficulty urinating and vaginal pain.  Musculoskeletal: Positive for arthralgias (R knee). Negative for back pain and gait problem.  Skin: Negative for pallor and rash.  Neurological: Negative for dizziness, tremors, weakness, numbness and headaches.  Psychiatric/Behavioral: Negative for confusion and sleep disturbance.       Objective:   Physical Exam  Constitutional: She appears well-developed. No distress.  obese  HENT:  Head: Normocephalic.  Right Ear: External ear normal.  Left Ear: External ear normal.  Nose: Nose normal.  Mouth/Throat: Oropharynx is clear and moist.  Eyes: Conjunctivae are normal. Pupils are equal, round, and reactive to light. Right eye exhibits no discharge. Left eye exhibits no discharge.  Neck: Normal range of motion. Neck supple. No JVD present. No tracheal deviation present.  No thyromegaly present.  Cardiovascular: Normal heart sounds.   No murmur heard. Irregular rate click  Pulmonary/Chest: No stridor. No respiratory distress. She has no wheezes.  Abdominal: Soft. Bowel sounds are normal. She exhibits no distension and no mass. There is no tenderness. There is no rebound and no guarding.  Musculoskeletal: She exhibits tenderness (R knee w/mild tenderness). She exhibits no edema.  Lymphadenopathy:    She has no cervical adenopathy.  Neurological: She displays normal reflexes. No cranial nerve deficit. She exhibits normal muscle tone. Coordination normal.  Skin: No rash noted. No erythema.  Psychiatric: She has a normal mood and affect. Her behavior is normal. Judgment and thought content normal.  L neck parasp muscles hurt w/palpation L dorsal wrist rash  Lab Results  Component Value Date   WBC 6.0 11/16/2014   HGB 12.8 11/16/2014   HCT 37.9 11/16/2014   PLT 288.0 11/16/2014   GLUCOSE 96 11/16/2014   CHOL 102 11/16/2014   TRIG 149.0 11/16/2014   HDL 42.80 11/16/2014   LDLCALC 29 11/16/2014   ALT 20 04/28/2014   AST 25 04/28/2014   NA 139 11/16/2014   K 4.3 11/16/2014   CL 105 11/16/2014   CREATININE 0.97 11/16/2014   BUN 18 11/16/2014   CO2 32 11/16/2014   TSH 3.19 11/16/2014   INR 3.1 04/12/2015   HGBA1C 6.5 11/16/2014   INR goal 2.5-3.5   Selected INR 1.6! (03/15/2015)   Next INR check 04/12/2015       CT/CTA head 09/10/14 IMPRESSION: CT HEAD: No acute intracranial process; normal noncontrast CT of the head for age.  CTA HEAD:  Normal CT angiogram of the head.      Assessment & Plan:

## 2015-04-12 NOTE — Progress Notes (Signed)
Pre visit review using our clinic review tool, if applicable. No additional management support is needed unless otherwise documented below in the visit note. 

## 2015-04-12 NOTE — Assessment & Plan Note (Signed)
Labs

## 2015-04-18 ENCOUNTER — Encounter: Payer: Self-pay | Admitting: Cardiovascular Disease

## 2015-04-19 ENCOUNTER — Telehealth: Payer: Self-pay | Admitting: Internal Medicine

## 2015-04-19 DIAGNOSIS — N814 Uterovaginal prolapse, unspecified: Secondary | ICD-10-CM

## 2015-04-19 NOTE — Telephone Encounter (Signed)
Pt called in and needs referral to GYN ASAP.  She said that she feel a lot of pressure down there and now she has a fever.     [phone]  Dr Ruthann Cancer on [facility]

## 2015-04-20 NOTE — Telephone Encounter (Signed)
Will ref OV w/any MD if worse Thx

## 2015-04-20 NOTE — Telephone Encounter (Signed)
Called pt no answer LMOM with md response.../lmb 

## 2015-05-03 ENCOUNTER — Other Ambulatory Visit: Payer: Self-pay

## 2015-05-03 ENCOUNTER — Other Ambulatory Visit: Payer: Self-pay | Admitting: Internal Medicine

## 2015-05-03 DIAGNOSIS — Z1231 Encounter for screening mammogram for malignant neoplasm of breast: Secondary | ICD-10-CM

## 2015-05-10 ENCOUNTER — Ambulatory Visit: Payer: Commercial Managed Care - HMO

## 2015-06-05 ENCOUNTER — Ambulatory Visit: Payer: Commercial Managed Care - HMO

## 2015-06-14 ENCOUNTER — Ambulatory Visit: Payer: Commercial Managed Care - HMO | Admitting: Cardiovascular Disease

## 2015-06-22 NOTE — Progress Notes (Signed)
Patient ID: Beth Miller, female   DOB: May 07, 1941, 74 y.o.   MRN: [id number] Beth Miller is seen for f/u MVR  Has multiple issues which include a prosthetic MV - dating back to 1995, chronic atrial fib, on coumadin and carotid disease. She is obese. Past smoker and she is diabetic.   F/U myovue done 01/18/13 normal no gating due to afib  Echo 01/13/13 normal EF and MVR normal Study Conclusions  - Left ventricle: The cavity size was normal. Wall thickness was increased in a pattern of mild LVH. The estimated ejection fraction was 55%. Wall motion was normal; there were no regional wall motion abnormalities. - Aortic valve: The valve appears to be grossly normal. Mild regurgitation. - Mitral valve: Mechanical mitral prosthesis is working well. Mean gradient: 85mm Hg (D). Peak gradient: 61mm Hg (D). - Left atrium: The atrium was moderately dilated. - Right ventricle: The cavity size was mildly dilated. Systolic function was mildly reduced. - Right atrium: The atrium was moderately dilated. - Tricuspid valve: Mild-moderate regurgitation. - Pulmonary arteries: PA peak pressure: 18mm Hg (S).  Carotid 04/06/14 plaque no stenosis f/u 6/17 Multinodular goiter  Biopsy ok   Last visit calcium blocker stopped and beta blocker added for rate control  ROS: Denies fever, malais, weight loss, blurry vision, decreased visual acuity, cough, sputum, SOB, hemoptysis, pleuritic pain, palpitaitons, heartburn, abdominal pain, melena, lower extremity edema, claudication, or rash.  All other systems reviewed and negative  General: Affect appropriate Healthy:  appears stated age 74: normal Neck supple with no adenopathy JVP normal right  bruits no thyromegaly Lungs clear with no wheezing and good diaphragmatic motion Heart:  C5/ click S2 no murmur, no rub, gallop or click PMI normal Abdomen: benighn, BS positve, no tenderness, no AAA no bruit.  No HSM or HJR Distal pulses intact with no bruits No  edema Neuro non-focal Skin warm and dry No muscular weakness   Current Outpatient Prescriptions  Medication Sig Dispense Refill  . acetaminophen (TYLENOL) 500 MG tablet Take 1,000 mg by mouth every 6 (six) hours as needed for mild pain.    Marland Kitchen amoxicillin (AMOXIL) 500 MG tablet Take four tablets one hour prior to dental procedure 12 tablet 1  . BEE POLLEN PO Take 2 capsules by mouth daily as needed (allergies).     . Cholecalciferol (VITAMIN D3) 1000 UNIT tablet Take 1,000 Units by mouth daily.      . digoxin (LANOXIN) 0.25 MG tablet Take 0.25 mg by mouth daily.    . furosemide (LASIX) 40 MG tablet Take 40 mg by mouth daily.    Marland Kitchen glucosamine-chondroitin 500-400 MG tablet Take 1 tablet by mouth daily as needed (joint pain).     Marland Kitchen loratadine (CLARITIN) 10 MG tablet Take 10 mg by mouth daily as needed for allergies.     . metoprolol (LOPRESSOR) 50 MG tablet Take 1 tablet (50 mg total) by mouth 2 (two) times daily. 60 tablet 3  . potassium chloride SA (K-DUR,KLOR-CON) 20 MEQ tablet Take 20 mEq by mouth daily.    Marland Kitchen tiZANidine (ZANAFLEX) 4 MG tablet TAKE 1 TABLET EVERY 8 HOURS AS NEEDED FOR MUSCLE SPASMS 90 tablet 3  . triamcinolone cream (KENALOG) 0.5 % Apply 1 application topically 3 (three) times daily. 30 g 2  . vitamin B-12 (CYANOCOBALAMIN) 1000 MCG tablet Take 1,000 mcg by mouth daily as needed (energy).    . warfarin (COUMADIN) 10 MG tablet TAKE 1 TABLET EVERY DAY 90 tablet 3  . warfarin (  COUMADIN) 5 MG tablet Take 1 tablet (5 mg total) by mouth daily. 90 tablet 3   No current facility-administered medications for this visit.    Allergies  Food; Furosemide; Quinine; Spironolactone; and Sulfadiazine  Electrocardiogram: 03/04/14  Afib rate 99 nonspecific ST/T wave change rate a little higher than  2014  At 66 03/29/15 afib rate 119  Nonspecific St changes   Assessment and Plan Afib:  Good anticoagulaiton seems to be forgetting to take meds from time to time  Anticoagulaiton:  INR RX no  bleeding issues  Consider lovenox bridging if stopped due to afib and mechanical MVR Goiter:  Stable biopsy negative for tumor HTN:  Well controlled.  Continue current medications and low sodium Dash type diet.   Carotid: Plaque on duplex 6/15  F/u next June no murmur  MVR:  Normal valve click  F/u echo next June   F/U with me 6 months  No tests   Jenkins Rouge

## 2015-06-26 ENCOUNTER — Encounter: Payer: Self-pay | Admitting: Cardiovascular Disease

## 2015-06-26 ENCOUNTER — Ambulatory Visit (INDEPENDENT_AMBULATORY_CARE_PROVIDER_SITE_OTHER): Payer: Commercial Managed Care - HMO | Admitting: Cardiovascular Disease

## 2015-06-26 VITALS — BP 102/66 | HR 58 | Ht 62.0 in | Wt 170.8 lb

## 2015-06-26 DIAGNOSIS — K036 Deposits [accretions] on teeth: Secondary | ICD-10-CM | POA: Diagnosis not present

## 2015-06-26 DIAGNOSIS — R000 Tachycardia, unspecified: Secondary | ICD-10-CM | POA: Diagnosis not present

## 2015-06-26 DIAGNOSIS — R635 Abnormal weight gain: Secondary | ICD-10-CM

## 2015-06-26 DIAGNOSIS — IMO0001 Reserved for inherently not codable concepts without codable children: Secondary | ICD-10-CM

## 2015-06-26 DIAGNOSIS — I359 Nonrheumatic aortic valve disorder, unspecified: Secondary | ICD-10-CM

## 2015-06-26 MED ORDER — METOPROLOL TARTRATE 50 MG PO TABS: 50.0000 mg | ORAL_TABLET | Freq: Two times a day (BID) | ORAL | Status: DC

## 2015-06-26 NOTE — Patient Instructions (Signed)
Medication Instructions:  NO CHANGES  Labwork: NONE  Testing/Procedures: Your physician has requested that you have an echocardiogram. Echocardiography is a painless test that uses sound waves to create images of your heart. It provides your doctor with information about the size and shape of your heart and how well your heart's chambers and valves are working. This procedure takes approximately one hour. There are no restrictions for this procedure. DUE IN   June  Your physician has requested that you have a carotid duplex. This test is an ultrasound of the carotid arteries in your neck. It looks at blood flow through these arteries that supply the brain with blood. Allow one hour for this exam. There are no restrictions or special instructions. DUE IN  [place]  Follow-Up: Your physician wants you to follow-up in: [place] ECHO  AND  CAROTID  You will receive a reminder letter in the mail two months in advance. If you don't receive a letter, please call our office to schedule the follow-up appointment.  You have been referred to [facility]  Any Other Special Instructions Will Be Listed Below (If Applicable).

## 2015-06-27 ENCOUNTER — Ambulatory Visit: Payer: Commercial Managed Care - HMO

## 2015-06-28 ENCOUNTER — Other Ambulatory Visit: Payer: Self-pay | Admitting: Cardiovascular Disease

## 2015-06-28 DIAGNOSIS — I6523 Occlusion and stenosis of bilateral carotid arteries: Secondary | ICD-10-CM

## 2015-07-04 ENCOUNTER — Other Ambulatory Visit: Payer: Self-pay

## 2015-07-04 ENCOUNTER — Ambulatory Visit (HOSPITAL_COMMUNITY): Payer: Commercial Managed Care - HMO | Attending: Cardiology

## 2015-07-04 DIAGNOSIS — I371 Nonrheumatic pulmonary valve insufficiency: Secondary | ICD-10-CM | POA: Insufficient documentation

## 2015-07-04 DIAGNOSIS — I359 Nonrheumatic aortic valve disorder, unspecified: Secondary | ICD-10-CM

## 2015-07-04 DIAGNOSIS — Z952 Presence of prosthetic heart valve: Secondary | ICD-10-CM | POA: Insufficient documentation

## 2015-07-04 DIAGNOSIS — I351 Nonrheumatic aortic (valve) insufficiency: Secondary | ICD-10-CM | POA: Insufficient documentation

## 2015-07-04 DIAGNOSIS — I071 Rheumatic tricuspid insufficiency: Secondary | ICD-10-CM | POA: Insufficient documentation

## 2015-07-04 DIAGNOSIS — I517 Cardiomegaly: Secondary | ICD-10-CM | POA: Diagnosis not present

## 2015-07-06 ENCOUNTER — Ambulatory Visit (HOSPITAL_COMMUNITY)
Admission: RE | Admit: 2015-07-06 | Discharge: 2015-07-06 | Disposition: A | Discharge status: Home / Self Care | Payer: Commercial Managed Care - HMO | Source: Ambulatory Visit | Attending: Cardiovascular Disease | Admitting: Cardiovascular Disease

## 2015-07-06 DIAGNOSIS — I6523 Occlusion and stenosis of bilateral carotid arteries: Secondary | ICD-10-CM | POA: Diagnosis not present

## 2015-07-06 DIAGNOSIS — E785 Hyperlipidemia, unspecified: Secondary | ICD-10-CM | POA: Insufficient documentation

## 2015-07-06 DIAGNOSIS — I2510 Atherosclerotic heart disease of native coronary artery without angina pectoris: Secondary | ICD-10-CM | POA: Insufficient documentation

## 2015-07-06 DIAGNOSIS — I10 Essential (primary) hypertension: Secondary | ICD-10-CM | POA: Diagnosis not present

## 2015-07-06 DIAGNOSIS — E119 Type 2 diabetes mellitus without complications: Secondary | ICD-10-CM | POA: Insufficient documentation

## 2015-07-19 ENCOUNTER — Ambulatory Visit (INDEPENDENT_AMBULATORY_CARE_PROVIDER_SITE_OTHER): Payer: Self-pay | Admitting: General Practice

## 2015-07-19 ENCOUNTER — Encounter: Payer: Self-pay | Admitting: Internal Medicine

## 2015-07-19 ENCOUNTER — Ambulatory Visit (INDEPENDENT_AMBULATORY_CARE_PROVIDER_SITE_OTHER): Payer: Commercial Managed Care - HMO | Admitting: Internal Medicine

## 2015-07-19 VITALS — BP 108/80 | HR 62 | Wt 168.0 lb

## 2015-07-19 DIAGNOSIS — E559 Vitamin D deficiency, unspecified: Secondary | ICD-10-CM | POA: Diagnosis not present

## 2015-07-19 DIAGNOSIS — R413 Other amnesia: Secondary | ICD-10-CM | POA: Diagnosis not present

## 2015-07-19 DIAGNOSIS — R202 Paresthesia of skin: Secondary | ICD-10-CM

## 2015-07-19 DIAGNOSIS — Z23 Encounter for immunization: Secondary | ICD-10-CM

## 2015-07-19 DIAGNOSIS — E119 Type 2 diabetes mellitus without complications: Secondary | ICD-10-CM

## 2015-07-19 DIAGNOSIS — Z9889 Other specified postprocedural states: Secondary | ICD-10-CM

## 2015-07-19 DIAGNOSIS — Z5181 Encounter for therapeutic drug level monitoring: Secondary | ICD-10-CM

## 2015-07-19 MED ORDER — TIZANIDINE HCL 4 MG PO TABS: ORAL_TABLET | ORAL | Status: DC

## 2015-07-19 MED ORDER — DONEPEZIL HCL 5 MG PO TABS: 5.0000 mg | ORAL_TABLET | Freq: Every day | ORAL | Status: DC

## 2015-07-19 NOTE — Progress Notes (Signed)
Pre visit review using our clinic review tool, if applicable. No additional management support is needed unless otherwise documented below in the visit note. 

## 2015-07-19 NOTE — Progress Notes (Signed)
Subjective:  Patient ID: Beth Miller, female    DOB: Jan 27, 1941  Age: 74 y.o. MRN: [id number]  CC: No chief complaint on file.   HPI Beth Miller presents for   Outpatient Prescriptions Prior to Visit  Medication Sig Dispense Refill  . acetaminophen (TYLENOL) 500 MG tablet Take 1,000 mg by mouth every 6 (six) hours as needed for mild pain.    Marland Kitchen amoxicillin (AMOXIL) 500 MG tablet Take four tablets one hour prior to dental procedure 12 tablet 1  . BEE POLLEN PO Take 2 capsules by mouth daily as needed (allergies).     . Cholecalciferol (VITAMIN D3) 1000 UNIT tablet Take 1,000 Units by mouth daily.      . digoxin (LANOXIN) 0.25 MG tablet Take 0.25 mg by mouth daily.    . furosemide (LASIX) 40 MG tablet Take 40 mg by mouth daily.    Marland Kitchen glucosamine-chondroitin 500-400 MG tablet Take 1 tablet by mouth daily as needed (joint pain).     Marland Kitchen loratadine (CLARITIN) 10 MG tablet Take 10 mg by mouth daily as needed for allergies.     . metoprolol (LOPRESSOR) 50 MG tablet Take 1 tablet (50 mg total) by mouth 2 (two) times daily. 180 tablet 3  . potassium chloride SA (K-DUR,KLOR-CON) 20 MEQ tablet Take 20 mEq by mouth daily.    Marland Kitchen triamcinolone cream (KENALOG) 0.5 % Apply 1 application topically 3 (three) times daily. 30 g 2  . vitamin B-12 (CYANOCOBALAMIN) 1000 MCG tablet Take 1,000 mcg by mouth daily as needed (energy).    . warfarin (COUMADIN) 10 MG tablet TAKE 1 TABLET EVERY DAY 90 tablet 3  . warfarin (COUMADIN) 5 MG tablet Take 1 tablet (5 mg total) by mouth daily. 90 tablet 3  . tiZANidine (ZANAFLEX) 4 MG tablet TAKE 1 TABLET EVERY 8 HOURS AS NEEDED FOR MUSCLE SPASMS 90 tablet 3   No facility-administered medications prior to visit.    ROS Review of Systems  Objective:  BP 108/80 mmHg  Pulse 62  Wt 168 lb (76.204 kg)  SpO2 96%  BP Readings from Last 3 Encounters:  07/19/15 108/80  06/26/15 102/66  04/12/15 112/62    Wt Readings from Last 3 Encounters:  07/19/15 168 lb  (76.204 kg)  06/26/15 170 lb 12.8 oz (77.474 kg)  04/12/15 171 lb (77.565 kg)    Physical Exam  Lab Results  Component Value Date   WBC 4.8 04/12/2015   HGB 13.1 04/12/2015   HCT 39.2 04/12/2015   PLT 289.0 04/12/2015   GLUCOSE 102* 04/12/2015   CHOL 105 04/12/2015   TRIG 105.0 04/12/2015   HDL 38.50* 04/12/2015   LDLCALC 46 04/12/2015   ALT 18 04/12/2015   AST 22 04/12/2015   NA 139 04/12/2015   K 4.2 04/12/2015   CL 104 04/12/2015   CREATININE 0.99 04/12/2015   BUN 16 04/12/2015   CO2 31 04/12/2015   TSH 2.37 04/12/2015   INR 3.1 04/12/2015   HGBA1C 5.9 04/12/2015    No results found.  Assessment & Plan:   Diagnoses and all orders for this visit:  Memory problem -     Basic metabolic panel; Future -     CBC with Differential/Platelet; Future -     Hemoglobin A1c; Future -     Hepatic function panel; Future -     TSH; Future -     Vitamin B12; Future -     Vit D  25 hydroxy (rtn osteoporosis monitoring);  Future  Vitamin D deficiency -     Vit D  25 hydroxy (rtn osteoporosis monitoring); Future  Type 2 diabetes mellitus without complication -     Basic metabolic panel; Future -     CBC with Differential/Platelet; Future -     Hemoglobin A1c; Future -     Hepatic function panel; Future -     TSH; Future  Paresthesia -     Basic metabolic panel; Future -     CBC with Differential/Platelet; Future -     Hemoglobin A1c; Future -     Hepatic function panel; Future -     TSH; Future -     Vitamin B12; Future -     Urinalysis; Future  Need for influenza vaccination -     Flu Vaccine QUAD 36+ mos IM  Other orders -     tiZANidine (ZANAFLEX) 4 MG tablet; TAKE 1 TABLET EVERY 8 HOURS AS NEEDED FOR MUSCLE SPASMS -     donepezil (ARICEPT) 5 MG tablet; Take 1 tablet (5 mg total) by mouth at bedtime.  I am having Beth Miller start on donepezil. I am also having her maintain her glucosamine-chondroitin, loratadine, cholecalciferol, warfarin, BEE POLLEN PO,  acetaminophen, vitamin B-12, warfarin, amoxicillin, triamcinolone cream, digoxin, furosemide, potassium chloride SA, metoprolol, and tiZANidine.  Meds ordered this encounter  Medications  . tiZANidine (ZANAFLEX) 4 MG tablet    Sig: TAKE 1 TABLET EVERY 8 HOURS AS NEEDED FOR MUSCLE SPASMS    Dispense:  90 tablet    Refill:  3  . donepezil (ARICEPT) 5 MG tablet    Sig: Take 1 tablet (5 mg total) by mouth at bedtime.    Dispense:  30 tablet    Refill:  11     Follow-up: Return in about 3 months (around 10/18/2015) for a follow-up visit.  Walker Kehr, MD

## 2015-07-19 NOTE — Assessment & Plan Note (Signed)
On Vit D 

## 2015-07-19 NOTE — Assessment & Plan Note (Signed)
Use CPAP We can try Aricept 5 mg/d

## 2015-07-19 NOTE — Assessment & Plan Note (Signed)
Labs

## 2015-07-19 NOTE — Progress Notes (Signed)
I have reviewed and agree with the plan. 

## 2015-08-04 ENCOUNTER — Telehealth: Payer: Self-pay | Admitting: *Deleted

## 2015-08-04 MED ORDER — WARFARIN SODIUM 5 MG PO TABS: 5.0000 mg | ORAL_TABLET | Freq: Every day | ORAL | Status: DC

## 2015-08-04 NOTE — Telephone Encounter (Signed)
Left msg on triage requesting rx for coumadin sent to [facility]. Called pt verify which dosage she is taking. Pt states she need the 5 mg. Inform will send rx to [facility]...Johny Chess

## 2015-08-18 ENCOUNTER — Emergency Department (HOSPITAL_COMMUNITY)
Admission: EM | Admit: 2015-08-18 | Discharge: 2015-08-18 | Disposition: A | Discharge status: Home / Self Care | Payer: Commercial Managed Care - HMO | Attending: Emergency Medicine | Admitting: Emergency Medicine

## 2015-08-18 ENCOUNTER — Ambulatory Visit (INDEPENDENT_AMBULATORY_CARE_PROVIDER_SITE_OTHER): Payer: Commercial Managed Care - HMO | Admitting: Family Medicine

## 2015-08-18 ENCOUNTER — Encounter (HOSPITAL_COMMUNITY): Payer: Self-pay | Admitting: Cardiology

## 2015-08-18 ENCOUNTER — Ambulatory Visit (INDEPENDENT_AMBULATORY_CARE_PROVIDER_SITE_OTHER): Payer: Commercial Managed Care - HMO | Admitting: General Practice

## 2015-08-18 ENCOUNTER — Emergency Department (HOSPITAL_COMMUNITY): Payer: Commercial Managed Care - HMO

## 2015-08-18 VITALS — BP 108/60 | HR 52 | Temp 98.0°F | Resp 16 | Ht 61.5 in | Wt 168.0 lb

## 2015-08-18 DIAGNOSIS — I482 Chronic atrial fibrillation, unspecified: Secondary | ICD-10-CM

## 2015-08-18 DIAGNOSIS — R079 Chest pain, unspecified: Secondary | ICD-10-CM | POA: Insufficient documentation

## 2015-08-18 DIAGNOSIS — Z8719 Personal history of other diseases of the digestive system: Secondary | ICD-10-CM | POA: Diagnosis not present

## 2015-08-18 DIAGNOSIS — H259 Unspecified age-related cataract: Secondary | ICD-10-CM | POA: Insufficient documentation

## 2015-08-18 DIAGNOSIS — E869 Volume depletion, unspecified: Secondary | ICD-10-CM

## 2015-08-18 DIAGNOSIS — E119 Type 2 diabetes mellitus without complications: Secondary | ICD-10-CM | POA: Diagnosis not present

## 2015-08-18 DIAGNOSIS — Z87891 Personal history of nicotine dependence: Secondary | ICD-10-CM | POA: Insufficient documentation

## 2015-08-18 DIAGNOSIS — Z8673 Personal history of transient ischemic attack (TIA), and cerebral infarction without residual deficits: Secondary | ICD-10-CM | POA: Insufficient documentation

## 2015-08-18 DIAGNOSIS — Z7952 Long term (current) use of systemic steroids: Secondary | ICD-10-CM | POA: Insufficient documentation

## 2015-08-18 DIAGNOSIS — R531 Weakness: Secondary | ICD-10-CM | POA: Insufficient documentation

## 2015-08-18 DIAGNOSIS — R001 Bradycardia, unspecified: Secondary | ICD-10-CM | POA: Insufficient documentation

## 2015-08-18 DIAGNOSIS — R42 Dizziness and giddiness: Secondary | ICD-10-CM | POA: Insufficient documentation

## 2015-08-18 DIAGNOSIS — Z5181 Encounter for therapeutic drug level monitoring: Secondary | ICD-10-CM

## 2015-08-18 DIAGNOSIS — I509 Heart failure, unspecified: Secondary | ICD-10-CM | POA: Diagnosis not present

## 2015-08-18 DIAGNOSIS — R110 Nausea: Secondary | ICD-10-CM | POA: Insufficient documentation

## 2015-08-18 DIAGNOSIS — Z79899 Other long term (current) drug therapy: Secondary | ICD-10-CM | POA: Diagnosis not present

## 2015-08-18 DIAGNOSIS — Z9889 Other specified postprocedural states: Secondary | ICD-10-CM | POA: Diagnosis not present

## 2015-08-18 DIAGNOSIS — Z7901 Long term (current) use of anticoagulants: Secondary | ICD-10-CM | POA: Diagnosis not present

## 2015-08-18 DIAGNOSIS — I10 Essential (primary) hypertension: Secondary | ICD-10-CM | POA: Diagnosis not present

## 2015-08-18 DIAGNOSIS — Z954 Presence of other heart-valve replacement: Secondary | ICD-10-CM | POA: Insufficient documentation

## 2015-08-18 DIAGNOSIS — E559 Vitamin D deficiency, unspecified: Secondary | ICD-10-CM | POA: Insufficient documentation

## 2015-08-18 DIAGNOSIS — R112 Nausea with vomiting, unspecified: Secondary | ICD-10-CM | POA: Diagnosis not present

## 2015-08-18 DIAGNOSIS — R197 Diarrhea, unspecified: Secondary | ICD-10-CM | POA: Diagnosis not present

## 2015-08-18 LAB — I-STAT TROPONIN, ED: TROPONIN I, POC: 0 ng/mL (ref 0.00–0.08)

## 2015-08-18 LAB — BASIC METABOLIC PANEL
ANION GAP: 10 (ref 5–15)
BUN: 9 mg/dL (ref 6–20)
CALCIUM: 10.1 mg/dL (ref 8.9–10.3)
CO2: 28 mmol/L (ref 22–32)
CREATININE: 0.9 mg/dL (ref 0.44–1.00)
Chloride: 105 mmol/L (ref 101–111)
Glucose, Bld: 86 mg/dL (ref 65–99)
Potassium: 4.3 mmol/L (ref 3.5–5.1)
SODIUM: 143 mmol/L (ref 135–145)

## 2015-08-18 LAB — CBC
HCT: 37 % (ref 36.0–46.0)
HEMOGLOBIN: 12.4 g/dL (ref 12.0–15.0)
MCH: 27.1 pg (ref 26.0–34.0)
MCHC: 33.5 g/dL (ref 30.0–36.0)
MCV: 81 fL (ref 78.0–100.0)
PLATELETS: 254 10*3/uL (ref 150–400)
RBC: 4.57 MIL/uL (ref 3.87–5.11)
RDW: 15.1 % (ref 11.5–15.5)
WBC: 4.5 10*3/uL (ref 4.0–10.5)

## 2015-08-18 LAB — MAGNESIUM: Magnesium: 1.7 mg/dL (ref 1.7–2.4)

## 2015-08-18 LAB — PROTIME-INR
INR: 2.97 — ABNORMAL HIGH (ref 0.00–1.49)
Prothrombin Time: 30.4 seconds — ABNORMAL HIGH (ref 11.6–15.2)

## 2015-08-18 LAB — POCT INR: INR: 3

## 2015-08-18 LAB — DIGOXIN LEVEL: DIGOXIN LVL: 0.8 ng/mL (ref 0.8–2.0)

## 2015-08-18 LAB — GLUCOSE, POCT (MANUAL RESULT ENTRY): POC GLUCOSE: 93 mg/dL (ref 70–99)

## 2015-08-18 MED ORDER — SODIUM CHLORIDE 0.9 % IV BOLUS (SEPSIS)
1000.0000 mL | Freq: Once | INTRAVENOUS | Status: AC
  Administered 2015-08-18: 1000 mL via INTRAVENOUS

## 2015-08-18 NOTE — Progress Notes (Signed)
Pre visit review using our clinic review tool, if applicable. No additional management support is needed unless otherwise documented below in the visit note. 
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
## 2015-08-18 NOTE — Discharge Instructions (Signed)
Bradycardia  Bradycardia is a slower-than-normal heart rate. A normal resting heart rate for an adult ranges from 60 to 100 beats per minute. With bradycardia, the resting heart rate is less than 60 beats per minute.  Bradycardia is a problem if your heart cannot pump enough oxygen-rich blood through your body. Bradycardia is not a problem for everyone. For some healthy adults, a slow resting heart rate is normal.   CAUSES   Bradycardia may be caused by:  · A problem with the heart's electrical system, such as heart block.  · A problem with the heart's natural pacemaker (sinus node).  · Heart disease, damage, or infection.  · Certain medicines that treat heart conditions.  · Certain conditions, such as hypothyroidism and obstructive sleep apnea.  RISK FACTORS   Risk factors include:  · Being 65 or older.  · Having high blood pressure (hypertension), high cholesterol (hyperlipidemia), or diabetes.  · Drinking heavily, using tobacco products, or using drugs.  · Being stressed.  SIGNS AND SYMPTOMS   Signs and symptoms include:  · Light-headedness.  · Fainting or near fainting.  · Fatigue and weakness.  · Shortness of breath.  · Chest pain (angina).  · Drowsiness.  · Confusion.  · Dizziness.  DIAGNOSIS   Diagnosis of bradycardia may include:  · A physical exam.  · An electrocardiogram (ECG).  · Blood tests.  TREATMENT   Treatment for bradycardia may include:  · Treatment of an underlying condition.  · Pacemaker placement. A pacemaker is a small, battery-powered device that is placed under the skin and is programmed to sense your heartbeats. If your heart rate is lower than the programmed rate, the pacemaker will pace your heart.  · Changing your medicines or dosages.  HOME CARE INSTRUCTIONS  · Take medicines only as directed by your health care provider.  · Manage any health conditions that contribute to bradycardia as directed by your health care provider.  · Follow a heart-healthy diet. A dietitian can help educate  you on healthy food options and changes.  · Follow an exercise program approved by your health care provider.  · Maintain a healthy weight. Lose weight as approved by your health care provider.  · Do not use tobacco products, including cigarettes, chewing tobacco, or electronic cigarettes. If you need help quitting, ask your health care provider.  · Do not use illegal drugs.  · Limit alcohol intake to no more than 1 drink per day for nonpregnant women and 2 drinks per day for men. One drink equals 12 ounces of beer, 5 ounces of wine, or 1½ ounces of hard liquor.  · Keep all follow-up visits as directed by your health care provider. This is important.  SEEK MEDICAL CARE IF:  · You feel light-headed or dizzy.  · You almost faint.  · You feel weak or are easily fatigued during physical activity.  · You experience confusion or have memory problems.  SEEK IMMEDIATE MEDICAL CARE IF:   · You faint.  · You have an irregular heartbeat.  · You have chest pain.  · You have trouble breathing.  MAKE SURE YOU:   · Understand these instructions.  · Will watch your condition.  · Will get help right away if you are not doing well or get worse.     This information is not intended to replace advice given to you by your health care provider. Make sure you discuss any questions you have with your health care provider.       Document Released: 07/06/2002 Document Revised: 11/04/2014 Document Reviewed: 01/19/2014  Elsevier Interactive Patient Education ©2016 [employer]

## 2015-08-18 NOTE — ED Notes (Signed)
Sent from [facility] office with weakness, dizziness, and nausea that started last night. Office called and wanted to send her via EMS but pt wanted to come by private vehicle.

## 2015-08-18 NOTE — ED Notes (Addendum)
Repeat EKG completed per Dr.Brooten

## 2015-08-18 NOTE — Progress Notes (Signed)
I have reviewed and agree with the plan. 

## 2015-08-18 NOTE — ED Provider Notes (Signed)
CSN: [id number]     Arrival date & time 08/18/15  1453 History   First MD Initiated Contact with Patient 08/18/15 1606     Chief Complaint  Patient presents with  . Dizziness  . Nausea  . Weakness     (Consider location/radiation/quality/duration/timing/severity/associated sxs/prior Treatment) Patient is a 74 y.o. female presenting with weakness. The history is provided by the patient.  Weakness This is a new problem. The current episode started yesterday. The problem occurs constantly. The problem has been unchanged. Associated symptoms include nausea and weakness. Pertinent negatives include no abdominal pain, anorexia, arthralgias, change in bowel habit, chest pain, chills, congestion, coughing, diaphoresis, fatigue, fever, headaches, joint swelling, myalgias, neck pain, numbness, rash, sore throat, swollen glands, urinary symptoms, vertigo, visual change or vomiting. The symptoms are aggravated by bending and standing. She has tried lying down and position changes for the symptoms. The treatment provided mild relief.    Past Medical History  Diagnosis Date  . Hypertension   . Diabetes mellitus   . GERD (gastroesophageal reflux disease)   . AF (atrial fibrillation) ([place])   . Chronic anticoagulation   . Chest pain   . Hematoma   . Diverticulosis   . Paresthesia   . Cataract, senile   . Disorder of oral soft tissue     of mouth  . Leg pain   . Sweating   . ALLERGIC RHINITIS   . Low back pain   . Diabetes mellitus   . Status post mitral valve replacement     St. Jude valve  . Vitamin D deficiency   . Blood transfusion without reported diagnosis   . CHF (congestive heart failure) ([place])   . Rheumatic fever   . Stroke [facility])     tia   Past Surgical History  Procedure Laterality Date  . Abdominal hysterectomy    . Cholecystectomy    . Cardiac valve replacement  1995    Mitral valve prosthesis; st jude   Family History  Problem Relation Age of Onset  . Heart disease  Brother   . Diabetes Mother   . Seizures Mother   . Other Mother     brain tumor  . Diabetes Brother   . Heart disease Brother   . Pancreatic cancer Brother   . Colon cancer Neg Hx    Social History  Substance Use Topics  . Smoking status: Former Smoker -- 0.02 packs/day for 60 years    Types: Cigarettes    Quit date: 10/28/2002  . Smokeless tobacco: Never Used  . Alcohol Use: No   OB History    No data available     Review of Systems  Constitutional: Negative for fever, chills, diaphoresis and fatigue.  HENT: Negative for congestion, facial swelling and sore throat.   Respiratory: Negative for cough and shortness of breath.   Cardiovascular: Negative for chest pain.  Gastrointestinal: Positive for nausea. Negative for vomiting, abdominal pain, anorexia and change in bowel habit.  Genitourinary: Negative for dysuria.  Musculoskeletal: Negative for myalgias, back pain, joint swelling, arthralgias and neck pain.  Skin: Negative for rash.  Neurological: Positive for dizziness, weakness and light-headedness. Negative for vertigo, tremors, seizures, syncope, facial asymmetry, speech difficulty, numbness and headaches.  Psychiatric/Behavioral: Negative for confusion.      Allergies  Food; Furosemide; Quinine; Spironolactone; and Sulfadiazine  Home Medications   Prior to Admission medications   Medication Sig Start Date End Date Taking? Authorizing Provider  acetaminophen (TYLENOL) 500 MG tablet Take  1,000 mg by mouth every 6 (six) hours as needed for mild pain.   Yes Historical Provider, MD  amoxicillin (AMOXIL) 500 MG tablet Take four tablets one hour prior to dental procedure Patient taking differently: Take 2,000 mg by mouth as needed (dental procedure). Take four tablets one hour prior to dental procedure 04/12/15  Yes Aleksei Plotnikov V, MD  Cholecalciferol (VITAMIN D3) 1000 UNIT tablet Take 1,000 Units by mouth daily.     Yes Historical Provider, MD  digoxin (LANOXIN)  0.25 MG tablet Take 0.25 mg by mouth at bedtime.    Yes Historical Provider, MD  donepezil (ARICEPT) 5 MG tablet Take 1 tablet (5 mg total) by mouth at bedtime. 07/19/15  Yes Aleksei Plotnikov V, MD  furosemide (LASIX) 40 MG tablet Take 40 mg by mouth at bedtime.    Yes Historical Provider, MD  glucosamine-chondroitin 500-400 MG tablet Take 1 tablet by mouth daily as needed (joint pain).    Yes Historical Provider, MD  loratadine (CLARITIN) 10 MG tablet Take 10 mg by mouth daily as needed for allergies.    Yes Historical Provider, MD  potassium chloride SA (K-DUR,KLOR-CON) 20 MEQ tablet Take 20 mEq by mouth daily.   Yes Historical Provider, MD  triamcinolone cream (KENALOG) 0.5 % Apply 1 application topically 3 (three) times daily. Patient taking differently: Apply 1 application topically 3 (three) times daily as needed (allergies).  04/12/15  Yes Aleksei Plotnikov V, MD  vitamin B-12 (CYANOCOBALAMIN) 1000 MCG tablet Take 1,000 mcg by mouth daily as needed (energy).   Yes Historical Provider, MD  warfarin (COUMADIN) 10 MG tablet TAKE 1 TABLET EVERY DAY Patient taking differently: TAKE 1 TABLET EVERY DAY AT BEDTIME ON TUE, THURS, SATU, AND SUN 11/21/14  Yes Aleksei Plotnikov V, MD  warfarin (COUMADIN) 5 MG tablet Take 1 tablet (5 mg total) by mouth daily. Patient taking differently: Take 5 mg by mouth every Monday, Wednesday, and Friday at 8 PM.  08/04/15  Yes Aleksei Plotnikov V, MD  metoprolol (LOPRESSOR) 50 MG tablet Take 0.5 tablets (25 mg total) by mouth 2 (two) times daily. 08/18/15   Hoyle Sauer, MD  tiZANidine (ZANAFLEX) 4 MG tablet TAKE 1 TABLET EVERY 8 HOURS AS NEEDED FOR MUSCLE SPASMS Patient not taking: Reported on 08/18/2015 07/19/15   Aleksei Plotnikov V, MD   BP 153/75 mmHg  Pulse 60  Temp(Src) 98.6 F (37 C) (Oral)  Resp 16  Ht 5\' 1"  (1.549 m)  Wt 168 lb (76.204 kg)  BMI 31.76 kg/m2  SpO2 96% Physical Exam  Constitutional: She is oriented to person, place, and time. She  appears well-developed and well-nourished. No distress.  HENT:  Head: Normocephalic and atraumatic.  Right Ear: External ear normal.  Left Ear: External ear normal.  Nose: Nose normal.  Mouth/Throat: Oropharynx is clear and moist. No oropharyngeal exudate.  Eyes: Conjunctivae and EOM are normal. Pupils are equal, round, and reactive to light. Right eye exhibits no discharge. Left eye exhibits no discharge. No scleral icterus.  Neck: Normal range of motion. Neck supple. No JVD present. No tracheal deviation present. No thyromegaly present.  Cardiovascular: Regular rhythm and intact distal pulses.  Bradycardia present.   Pulmonary/Chest: Effort normal and breath sounds normal. No stridor. No respiratory distress. She has no wheezes. She has no rales. She exhibits no tenderness.  Abdominal: Soft. She exhibits no distension. There is no tenderness.  Musculoskeletal: Normal range of motion. She exhibits no edema or tenderness.  Lymphadenopathy:    She has  no cervical adenopathy.  Neurological: She is alert and oriented to person, place, and time. She displays normal reflexes. No cranial nerve deficit. She exhibits normal muscle tone. Coordination normal.  Skin: Skin is warm and dry. No rash noted. She is not diaphoretic. No erythema. No pallor.  Psychiatric: She has a normal mood and affect. Her behavior is normal. Judgment and thought content normal.  Nursing note and vitals reviewed.   ED Course  Procedures (including critical care time) Labs Review Labs Reviewed  PROTIME-INR - Abnormal; Notable for the following:    Prothrombin Time 30.4 (*)    INR 2.97 (*)    All other components within normal limits  BASIC METABOLIC PANEL  CBC  MAGNESIUM  DIGOXIN LEVEL  I-STAT TROPOININ, ED    Imaging Review Dg Chest 2 View  08/18/2015  CLINICAL DATA:  Dizziness, cold and dehydration. Weakness, nausea and diarrhea. Leg cramps. Symptoms 2 days. EXAM: CHEST  2 VIEW COMPARISON:  01/08/2013  FINDINGS: Sternotomy wires unchanged. Lungs are adequately inflated without focal consolidation or effusion. Mild stable cardiomegaly with left atrial enlargement. Remainder of the exam is unchanged. IMPRESSION: No active cardiopulmonary disease. Stable cardiomegaly with left atrial enlargement. Electronically Signed   By: Marin Olp M.D.   On: 08/18/2015 15:43   I have personally reviewed and evaluated these images and lab results as part of my medical decision-making.   EKG Interpretation   Date/Time:  Friday August 18 2015 15:09:32 EDT Ventricular Rate:  63 PR Interval:    QRS Duration: 86 QT Interval:  378 QTC Calculation: 386 R Axis:   73 Text Interpretation:  Atrial fibrillation Nonspecific ST and T wave  abnormality Abnormal ECG No old tracing to compare Confirmed by NANAVATI,  MD, Thelma Comp [phone]) on 08/18/2015 4:39:57 PM      MDM   Final diagnoses:  Bradycardia  Dizziness    Patient presenting with 2 days of weakness, dizziness, nausea, vomiting. Patient states that she's been in general more weak for about the past month since starting metoprolol. patient has a history of atrial fibrillation. She was seen in urgent care today and sent for further evaluation. ECG shows likely atrial fibrillation with narrow Beats occurring grouped together. No obvious specific conduction block, suspect increased AV conduction delay due to possibly adverse effect of metoprolol. Patient states she is feeling better since being emergency department and since being off of the medicine. Also suspect some superimposed dehydration with her recent history T workup is unremarkable with respect to acute electro abnormalities of concern. No acute findings on chest x-ray nonspecific changes in lateral leads compared to prior ECG. Patient denies any chest pain, or shortness of breath. Suspect patient needs reduction in her metoprolol. Following observation in the emergency department advised patient to switch  to a smaller dose to prevent transition atrial fibrillation with rapid ventricular response.  Pt to go home with recommendation for 25mg  Metoprolol BID.  Patient also received a bolus of IV fluid to see if this improved her symptoms as well. She has had somewhat labile blood pressures in the emergency department but noted associated symptoms with this. Patient was not orthostatic upon examination and does not have any focal neurologic deficits.  Patient was given return precautions for dizziness.  Advised to return for worsening symptoms including chest pain, shortness of breath, severe headache, intractable nausea or vomiting, fever, or chills, inability to take medications, or other acute concerns.  Advised to follow up with PCP in 3 days.  Patient  was in agreement with and expressed understanding of follow plan, plan of care, and return precautions.  All questions answered prior to discharge.  Patient was discharged in stable condition, ambulating without difficulty.  Patient care was discussed with my attending, Dr. Kathrynn Humble.    Hoyle Sauer, MD 08/19/15 3202  Varney Biles, MD 09/08/15 [id number]

## 2015-08-18 NOTE — Patient Instructions (Addendum)
Your dizziness may be due in part to dehydration with the vomiting, diarrhea, and decreased food intake, but your heart rate is also low, so may have other contributing causes.  THis is reason I recommend evaluation in the emergency room.  As you have declined EMS transport, if there are any change or worsening of your symptoms on the way to [facility] ER - pull over and call 911.  I advised the triage staff at [facility] ER that you are on the way.

## 2015-08-25 ENCOUNTER — Telehealth: Payer: Self-pay | Admitting: Cardiovascular Disease

## 2015-08-25 NOTE — Telephone Encounter (Signed)
Pt c/o medication issue:  1. Name of Medication: Metoprolol 50mg   2. How are you currently taking this medication (dosage and times per day)? Twice a day  3. Are you having a reaction (difficulty breathing--STAT)? No  4. What is your medication issue? Pt stated medication isn't working for her

## 2015-08-25 NOTE — Telephone Encounter (Signed)
Patient has been feeling shaky, not sleeping well, dizzy, tired, having cramps and not feeling well over all, since starting metoprolol in June. BP 120/68 HR 60's. Patient wants to know if she can take something else or go back to Cardizem. Will forward to Dr. Johnsie Cancel for advisement.

## 2015-08-28 NOTE — Telephone Encounter (Signed)
PT  NOTIFIED   MAY  DISCONTINUE  METOPROLOL AND  RESTART  CARTA  240 MG INSTRUCTED IF   NO IMPROVEMENT  IN SYMPTOMS    THEN  SIDE EFFECTS  MAY BE  COMING  FROM [facility]  AS THIS  MED  IS NEW PER  PT .Adonis Housekeeper

## 2015-08-30 ENCOUNTER — Ambulatory Visit: Payer: Commercial Managed Care - HMO

## 2015-08-31 ENCOUNTER — Ambulatory Visit
Admission: RE | Admit: 2015-08-31 | Discharge: 2015-08-31 | Disposition: A | Discharge status: Home / Self Care | Payer: Commercial Managed Care - HMO | Source: Ambulatory Visit | Attending: Internal Medicine | Admitting: Internal Medicine

## 2015-08-31 DIAGNOSIS — Z1231 Encounter for screening mammogram for malignant neoplasm of breast: Secondary | ICD-10-CM

## 2015-09-27 ENCOUNTER — Ambulatory Visit: Payer: Commercial Managed Care - HMO

## 2015-10-12 ENCOUNTER — Ambulatory Visit: Payer: Commercial Managed Care - HMO | Admitting: Internal Medicine

## 2015-11-26 ENCOUNTER — Other Ambulatory Visit: Payer: Self-pay | Admitting: Internal Medicine

## 2015-12-04 ENCOUNTER — Other Ambulatory Visit: Payer: Self-pay | Admitting: *Deleted

## 2015-12-04 MED ORDER — ACCU-CHEK SOFTCLIX LANCET DEV MISC: 1.0000 | Freq: Two times a day (BID) | Status: AC

## 2015-12-04 MED ORDER — ACCU-CHEK AVIVA PLUS W/DEVICE KIT: 1.0000 | PACK | Freq: Two times a day (BID) | Status: DC

## 2015-12-04 MED ORDER — GLUCOSE BLOOD VI STRP: 1.0000 | ORAL_STRIP | Freq: Two times a day (BID) | Status: DC

## 2015-12-04 MED ORDER — ACCU-CHEK AVIVA VI SOLN: 1.0000 | Status: DC

## 2015-12-04 MED ORDER — BD SWAB SINGLE USE REGULAR PADS: 1.0000 | MEDICATED_PAD | Freq: Two times a day (BID) | Status: DC

## 2016-02-16 ENCOUNTER — Telehealth: Payer: Self-pay

## 2016-02-16 ENCOUNTER — Ambulatory Visit (INDEPENDENT_AMBULATORY_CARE_PROVIDER_SITE_OTHER): Payer: Commercial Managed Care - HMO | Admitting: Internal Medicine

## 2016-02-16 ENCOUNTER — Encounter: Payer: Self-pay | Admitting: Internal Medicine

## 2016-02-16 ENCOUNTER — Ambulatory Visit (INDEPENDENT_AMBULATORY_CARE_PROVIDER_SITE_OTHER): Payer: Commercial Managed Care - HMO | Admitting: General Practice

## 2016-02-16 VITALS — BP 92/70 | HR 96 | Wt 172.0 lb

## 2016-02-16 DIAGNOSIS — E559 Vitamin D deficiency, unspecified: Secondary | ICD-10-CM

## 2016-02-16 DIAGNOSIS — M25519 Pain in unspecified shoulder: Secondary | ICD-10-CM | POA: Insufficient documentation

## 2016-02-16 DIAGNOSIS — M25511 Pain in right shoulder: Secondary | ICD-10-CM | POA: Diagnosis not present

## 2016-02-16 DIAGNOSIS — I10 Essential (primary) hypertension: Secondary | ICD-10-CM | POA: Diagnosis not present

## 2016-02-16 DIAGNOSIS — I4891 Unspecified atrial fibrillation: Secondary | ICD-10-CM

## 2016-02-16 DIAGNOSIS — M542 Cervicalgia: Secondary | ICD-10-CM

## 2016-02-16 DIAGNOSIS — Z5181 Encounter for therapeutic drug level monitoring: Secondary | ICD-10-CM | POA: Diagnosis not present

## 2016-02-16 DIAGNOSIS — E119 Type 2 diabetes mellitus without complications: Secondary | ICD-10-CM | POA: Diagnosis not present

## 2016-02-16 DIAGNOSIS — Z9889 Other specified postprocedural states: Secondary | ICD-10-CM | POA: Diagnosis not present

## 2016-02-16 DIAGNOSIS — M25512 Pain in left shoulder: Secondary | ICD-10-CM

## 2016-02-16 LAB — POCT INR: INR: 3.5

## 2016-02-16 MED ORDER — POTASSIUM CHLORIDE CRYS ER 20 MEQ PO TBCR: 20.0000 meq | EXTENDED_RELEASE_TABLET | Freq: Every day | ORAL | Status: DC

## 2016-02-16 MED ORDER — DIGOXIN 250 MCG PO TABS: 0.2500 mg | ORAL_TABLET | Freq: Every day | ORAL | Status: DC

## 2016-02-16 MED ORDER — FUROSEMIDE 40 MG PO TABS: 40.0000 mg | ORAL_TABLET | Freq: Every day | ORAL | Status: DC

## 2016-02-16 MED ORDER — WARFARIN SODIUM 10 MG PO TABS: 10.0000 mg | ORAL_TABLET | Freq: Every day | ORAL | Status: DC

## 2016-02-16 MED ORDER — AMOXICILLIN 500 MG PO TABS: ORAL_TABLET | ORAL | Status: DC

## 2016-02-16 MED ORDER — DILTIAZEM HCL ER COATED BEADS 240 MG PO CP24: 240.0000 mg | ORAL_CAPSULE | Freq: Every day | ORAL | Status: DC

## 2016-02-16 MED ORDER — WARFARIN SODIUM 5 MG PO TABS: 5.0000 mg | ORAL_TABLET | Freq: Every day | ORAL | Status: DC

## 2016-02-16 MED ORDER — HYDROCODONE-ACETAMINOPHEN 5-325 MG PO TABS: 0.5000 | ORAL_TABLET | Freq: Two times a day (BID) | ORAL | Status: DC | PRN

## 2016-02-16 NOTE — Assessment & Plan Note (Signed)
Chronic L>R 2017 Labs Norco prn  Potential benefits of a long term opioids use as well as potential risks (i.e. addiction risk, apnea etc) and complications (i.e. Somnolence, constipation and others) were explained to the patient and were aknowledged.

## 2016-02-16 NOTE — Progress Notes (Signed)
I have reviewed and agree with the plan. 

## 2016-02-16 NOTE — Progress Notes (Signed)
Pre visit review using our clinic review tool, if applicable. No additional management support is needed unless otherwise documented below in the visit note. 

## 2016-02-16 NOTE — Assessment & Plan Note (Signed)
Labs

## 2016-02-16 NOTE — Progress Notes (Signed)
Subjective:  Patient ID: Beth Miller, female    DOB: 01/27/1941  Age: 75 y.o. MRN: [id number]  CC: No chief complaint on file.   HPI Beth Miller presents for pain in the shoulders - bad x 4 mo; can't sleep. Pain is 8/10. There is some low back pain too  Outpatient Prescriptions Prior to Visit  Medication Sig Dispense Refill  . acetaminophen (TYLENOL) 500 MG tablet Take 1,000 mg by mouth every 6 (six) hours as needed for mild pain.    . Alcohol Swabs (B-D SINGLE USE SWABS REGULAR) PADS 1 each by Does not apply route 2 (two) times daily. 100 each 5  . amoxicillin (AMOXIL) 500 MG tablet Take four tablets one hour prior to dental procedure (Patient taking differently: Take 2,000 mg by mouth as needed (dental procedure). Take four tablets one hour prior to dental procedure) 12 tablet 1  . Blood Glucose Calibration (ACCU-CHEK AVIVA) SOLN 1 each by In Vitro route as directed. 1 each 2  . Blood Glucose Monitoring Suppl (ACCU-CHEK AVIVA PLUS) w/Device KIT 1 each by Does not apply route 2 (two) times daily. 1 kit 0  . CARTIA XT 240 MG 24 hr capsule TAKE 1 CAPSULE ONE TIME DAILY 90 capsule 0  . Cholecalciferol (VITAMIN D3) 1000 UNIT tablet Take 1,000 Units by mouth daily.      . digoxin (LANOXIN) 0.25 MG tablet Take 0.25 mg by mouth at bedtime.     . donepezil (ARICEPT) 5 MG tablet Take 1 tablet (5 mg total) by mouth at bedtime. 30 tablet 11  . furosemide (LASIX) 40 MG tablet Take 40 mg by mouth at bedtime.     Marland Kitchen glucosamine-chondroitin 500-400 MG tablet Take 1 tablet by mouth daily as needed (joint pain).     Marland Kitchen glucose blood (ACCU-CHEK AVIVA) test strip 1 each by Other route 2 (two) times daily. Use as instructed 100 each 5  . Lancet Devices (ACCU-CHEK SOFTCLIX) lancets 1 each by Other route 2 (two) times daily. Use as instructed 1 each 5  . loratadine (CLARITIN) 10 MG tablet Take 10 mg by mouth daily as needed for allergies.     . potassium chloride SA (K-DUR,KLOR-CON) 20 MEQ tablet TAKE 1  TABLET EVERY DAY 90 tablet 0  . tiZANidine (ZANAFLEX) 4 MG tablet TAKE 1 TABLET EVERY 8 HOURS AS NEEDED FOR MUSCLE SPASMS 90 tablet 3  . triamcinolone cream (KENALOG) 0.5 % Apply 1 application topically 3 (three) times daily. (Patient taking differently: Apply 1 application topically 3 (three) times daily as needed (allergies). ) 30 g 2  . vitamin B-12 (CYANOCOBALAMIN) 1000 MCG tablet Take 1,000 mcg by mouth daily as needed (energy).    . warfarin (COUMADIN) 10 MG tablet TAKE 1 TABLET EVERY DAY 90 tablet 0  . warfarin (COUMADIN) 5 MG tablet Take 1 tablet (5 mg total) by mouth daily. (Patient taking differently: Take 5 mg by mouth every Monday, Wednesday, and Friday at 8 PM. ) 90 tablet 1   No facility-administered medications prior to visit.    ROS Review of Systems  Constitutional: Negative for chills, activity change, appetite change, fatigue and unexpected weight change.  HENT: Negative for congestion, mouth sores and sinus pressure.   Eyes: Negative for visual disturbance.  Respiratory: Negative for cough and chest tightness.   Gastrointestinal: Negative for nausea and abdominal pain.  Genitourinary: Negative for frequency, difficulty urinating and vaginal pain.  Musculoskeletal: Positive for back pain, arthralgias and neck pain. Negative for gait  problem.  Skin: Negative for pallor and rash.  Neurological: Negative for dizziness, tremors, weakness, numbness and headaches.  Psychiatric/Behavioral: Negative for confusion and sleep disturbance. The patient is not nervous/anxious.     Objective:  BP 92/70 mmHg  Pulse 96  Wt 172 lb (78.019 kg)  SpO2 95%  BP Readings from Last 3 Encounters:  02/16/16 92/70  08/18/15 124/59  08/18/15 108/60    Wt Readings from Last 3 Encounters:  02/16/16 172 lb (78.019 kg)  08/18/15 168 lb (76.204 kg)  08/18/15 168 lb (76.204 kg)    Physical Exam  Constitutional: She appears well-developed. No distress.  HENT:  Head: Normocephalic.  Right  Ear: External ear normal.  Left Ear: External ear normal.  Nose: Nose normal.  Mouth/Throat: Oropharynx is clear and moist.  Eyes: Conjunctivae are normal. Pupils are equal, round, and reactive to light. Right eye exhibits no discharge. Left eye exhibits no discharge.  Neck: Normal range of motion. Neck supple. No JVD present. No tracheal deviation present. No thyromegaly present.  Cardiovascular: Normal rate, regular rhythm and normal heart sounds.   Pulmonary/Chest: No stridor. No respiratory distress. She has no wheezes.  Abdominal: Soft. Bowel sounds are normal. She exhibits no distension and no mass. There is no tenderness. There is no rebound and no guarding.  Musculoskeletal: She exhibits tenderness. She exhibits no edema.  Lymphadenopathy:    She has no cervical adenopathy.  Neurological: She displays normal reflexes. No cranial nerve deficit. She exhibits normal muscle tone. Coordination normal.  Skin: No rash noted. No erythema.  Psychiatric: She has a normal mood and affect. Her behavior is normal. Judgment and thought content normal.  L>R shoulder is painful w/ROM  Lab Results  Component Value Date   WBC 4.5 08/18/2015   HGB 12.4 08/18/2015   HCT 37.0 08/18/2015   PLT 254 08/18/2015   GLUCOSE 86 08/18/2015   CHOL 105 04/12/2015   TRIG 105.0 04/12/2015   HDL 38.50* 04/12/2015   LDLCALC 46 04/12/2015   ALT 18 04/12/2015   AST 22 04/12/2015   NA 143 08/18/2015   K 4.3 08/18/2015   CL 105 08/18/2015   CREATININE 0.90 08/18/2015   BUN 9 08/18/2015   CO2 28 08/18/2015   TSH 2.37 04/12/2015   INR 2.97* 08/18/2015   HGBA1C 5.9 04/12/2015    Mm Screening Breast Tomo Bilateral  09/01/2015  CLINICAL DATA:  Screening. EXAM: DIGITAL SCREENING BILATERAL MAMMOGRAM WITH 3D TOMO WITH CAD COMPARISON:  Previous exam(s). ACR Breast Density Category b: There are scattered areas of fibroglandular density. FINDINGS: There are no findings suspicious for malignancy. Images were processed  with CAD. IMPRESSION: No mammographic evidence of malignancy. A result letter of this screening mammogram will be mailed directly to the patient. RECOMMENDATION: Screening mammogram in one year. (Code:[id number]-[id number]) BI-RADS CATEGORY  1: Negative. Electronically Signed   By: Skipper Cliche M.D.   On: 09/01/2015 13:39    Assessment & Plan:   Diagnoses and all orders for this visit:  Essential hypertension -     amoxicillin (AMOXIL) 500 MG tablet; Take four tablets one hour prior to dental procedure  Other orders -     digoxin (LANOXIN) 0.25 MG tablet; Take 1 tablet (0.25 mg total) by mouth at bedtime. -     diltiazem (CARTIA XT) 240 MG 24 hr capsule; Take 1 capsule (240 mg total) by mouth daily. -     potassium chloride SA (K-DUR,KLOR-CON) 20 MEQ tablet; Take 1 tablet (20 mEq total) by  mouth daily. -     warfarin (COUMADIN) 10 MG tablet; Take 1 tablet (10 mg total) by mouth daily. -     warfarin (COUMADIN) 5 MG tablet; Take 1 tablet (5 mg total) by mouth daily. -     furosemide (LASIX) 40 MG tablet; Take 1 tablet (40 mg total) by mouth at bedtime.   I am having Ms. Lembcke maintain her glucosamine-chondroitin, loratadine, cholecalciferol, acetaminophen, vitamin B-12, amoxicillin, triamcinolone cream, digoxin, furosemide, tiZANidine, donepezil, warfarin, CARTIA XT, potassium chloride SA, warfarin, B-D SINGLE USE SWABS REGULAR, ACCU-CHEK AVIVA, ACCU-CHEK AVIVA PLUS, glucose blood, and accu-chek softclix.  No orders of the defined types were placed in this encounter.     Follow-up: No Follow-up on file.  Walker Kehr, MD

## 2016-02-16 NOTE — Telephone Encounter (Signed)
Discussed an AWV with this patient prior to her seeing Dr. Alain Marion;  I was not able to see her post visit, but will outreach her to schedule next week.

## 2016-02-23 NOTE — Telephone Encounter (Signed)
Call to Ms. Didonato to schedule AWV; Agreed to come have labs drawn and stay for AWV on May 2nd at 8am

## 2016-02-26 ENCOUNTER — Other Ambulatory Visit (INDEPENDENT_AMBULATORY_CARE_PROVIDER_SITE_OTHER): Payer: Commercial Managed Care - HMO

## 2016-02-26 ENCOUNTER — Telehealth: Payer: Self-pay

## 2016-02-26 ENCOUNTER — Ambulatory Visit (INDEPENDENT_AMBULATORY_CARE_PROVIDER_SITE_OTHER): Payer: Commercial Managed Care - HMO

## 2016-02-26 VITALS — BP 114/60 | HR 67 | Temp 97.3°F | Ht 62.0 in | Wt 175.8 lb

## 2016-02-26 DIAGNOSIS — E119 Type 2 diabetes mellitus without complications: Secondary | ICD-10-CM | POA: Diagnosis not present

## 2016-02-26 DIAGNOSIS — M25519 Pain in unspecified shoulder: Secondary | ICD-10-CM | POA: Diagnosis not present

## 2016-02-26 DIAGNOSIS — I10 Essential (primary) hypertension: Secondary | ICD-10-CM

## 2016-02-26 DIAGNOSIS — Z0000 Encounter for general adult medical examination without abnormal findings: Secondary | ICD-10-CM | POA: Diagnosis not present

## 2016-02-26 DIAGNOSIS — M25511 Pain in right shoulder: Secondary | ICD-10-CM

## 2016-02-26 DIAGNOSIS — E559 Vitamin D deficiency, unspecified: Secondary | ICD-10-CM

## 2016-02-26 DIAGNOSIS — M542 Cervicalgia: Secondary | ICD-10-CM | POA: Diagnosis not present

## 2016-02-26 DIAGNOSIS — M25512 Pain in left shoulder: Secondary | ICD-10-CM | POA: Diagnosis not present

## 2016-02-26 LAB — CBC WITH DIFFERENTIAL/PLATELET
BASOS PCT: 0.7 % (ref 0.0–3.0)
Basophils Absolute: 0 10*3/uL (ref 0.0–0.1)
EOS ABS: 0.1 10*3/uL (ref 0.0–0.7)
EOS PCT: 1 % (ref 0.0–5.0)
HCT: 32.1 % — ABNORMAL LOW (ref 36.0–46.0)
HEMOGLOBIN: 10.9 g/dL — AB (ref 12.0–15.0)
LYMPHS ABS: 1.9 10*3/uL (ref 0.7–4.0)
Lymphocytes Relative: 34.2 % (ref 12.0–46.0)
MCHC: 33.9 g/dL (ref 30.0–36.0)
MCV: 82.9 fl (ref 78.0–100.0)
MONO ABS: 0.8 10*3/uL (ref 0.1–1.0)
Monocytes Relative: 14.6 % — ABNORMAL HIGH (ref 3.0–12.0)
NEUTROS ABS: 2.8 10*3/uL (ref 1.4–7.7)
Neutrophils Relative %: 49.5 % (ref 43.0–77.0)
PLATELETS: 266 10*3/uL (ref 150.0–400.0)
RBC: 3.87 Mil/uL (ref 3.87–5.11)
RDW: 15 % (ref 11.5–15.5)
WBC: 5.7 10*3/uL (ref 4.0–10.5)

## 2016-02-26 LAB — HEMOGLOBIN A1C: HEMOGLOBIN A1C: 6.3 % (ref 4.6–6.5)

## 2016-02-26 LAB — PROTIME-INR
INR: 5 ratio — ABNORMAL HIGH (ref 0.8–1.0)
Prothrombin Time: 54.4 s (ref 9.6–13.1)

## 2016-02-26 LAB — VITAMIN D 25 HYDROXY (VIT D DEFICIENCY, FRACTURES): VITD: 39.7 ng/mL (ref 30.00–100.00)

## 2016-02-26 LAB — RHEUMATOID FACTOR: Rhuematoid fact SerPl-aCnc: <10 IU/mL (ref <=14)

## 2016-02-26 LAB — SEDIMENTATION RATE: Sed Rate: 20 mm/hr (ref 0–22)

## 2016-02-26 MED ORDER — DIGOXIN 250 MCG PO TABS: 0.2500 mg | ORAL_TABLET | Freq: Every day | ORAL | Status: DC

## 2016-02-26 NOTE — Telephone Encounter (Signed)
OK #7 - pls call in locally Thx

## 2016-02-26 NOTE — Telephone Encounter (Signed)
Notified pt week supply has been sent to [facility]...Beth Miller

## 2016-02-26 NOTE — Telephone Encounter (Signed)
Hold Coumadin.  Coum Clinic OV tomorrow, pls Thx

## 2016-02-26 NOTE — Telephone Encounter (Signed)
Please advise 

## 2016-02-26 NOTE — Patient Instructions (Addendum)
Beth Miller , Thank you for taking time to come for your Medicare Wellness Visit. I appreciate your ongoing commitment to your health goals. Please review the following plan we discussed and let me know if I can assist you in the future.   Will try to lose a few pounds or walk more as we discussed   Will restart Digoxin as rx today; will take Lasix today or tommorow  [facility]  No reviews  [facility]  [place] [address]  [phone]     These are the goals we discussed: Goals    . Weight < 200 lb (90.719 kg)     Watch sweets Controllable  risk for heart disease reviewed for goal setting: I Heart Healthy Diet; less sugar; less fat; more exercise   Mediterranean Diet: eating primarily plant-based food such as fruits and vegetables, whole grains, legumes and nuts; replacing butter with healthy fats such as olive oil and canola oil Using herbs and spices instead of salt to flavor food Limiting red meat to no more than a few times a month Eating fish and poultry at least 2 times a week Getting plenty of exercise  2 things help pre-diabetes; exercise (30 min of walking ) or water aerobics;  Cut back on sugar              This is a list of the screening recommended for you and due dates:  Health Maintenance  Topic Date Due  . Complete foot exam   [birth year]/1949  . Urine Protein Check  [birth year]09/1949  . Tetanus Vaccine  08/09/1958  . Shingles Vaccine  08/10/1999  . DEXA scan (bone density measurement)  08/09/2004  . Eye exam for diabetics  07/22/2013  . Hemoglobin A1C  10/12/2015  . Flu Shot  05/28/2016  . Colon Cancer Screening  09/09/2017  . Pneumonia vaccines  Completed     Bone Densitometry Bone densitometry is an imaging test that uses a special X-ray to measure the amount of calcium and other minerals in your bones (bone density). This test is also known as a bone mineral density test or dual-energy X-ray absorptiometry  (DXA). The test can measure bone density at your hip and your spine. It is similar to having a regular X-ray. You may have this test to:  Diagnose a condition that causes weak or thin bones (osteoporosis).  Predict your risk of a broken bone (fracture).  Determine how well osteoporosis treatment is working. LET [facility] CARE PROVIDER KNOW ABOUT:  Any allergies you have.  All medicines you are taking, including vitamins, herbs, eye drops, creams, and over-the-counter medicines.  Previous problems you or members of your family have had with the use of anesthetics.  Any blood disorders you have.  Previous surgeries you have had.  Medical conditions you have.  Possibility of pregnancy.  Any other medical test you had within the previous 14 days that used contrast material. RISKS AND COMPLICATIONS Generally, this is a safe procedure. However, problems can occur and may include the following:  This test exposes you to a very small amount of radiation.  The risks of radiation exposure may be greater to unborn children. BEFORE THE PROCEDURE  Do not take any calcium supplements for 24 hours before having the test. You can otherwise eat and drink what you usually do.  Take off all metal jewelry, eyeglasses, dental appliances, and any other metal objects. PROCEDURE  You may lie  on an exam table. There will be an X-ray generator below you and an imaging device above you.  Other devices, such as boxes or braces, may be used to position your body properly for the scan.  You will need to lie still while the machine slowly scans your body.  The images will show up on a computer monitor. AFTER THE PROCEDURE You may need more testing at a later time.   This information is not intended to replace advice given to you by your health care provider. Make sure you discuss any questions you have with your health care provider.   Document Released: 11/05/2004 Document Revised: 11/04/2014  Document Reviewed: 03/24/2014 Elsevier Interactive Patient Education 2016 [place] in the Home  Falls can cause injuries. They can happen to people of all ages. There are many things you can do to make your home safe and to help prevent falls.  WHAT CAN I DO ON THE OUTSIDE OF MY HOME?  Regularly fix the edges of walkways and driveways and fix any cracks.  Remove anything that might make you trip as you walk through a door, such as a raised step or threshold.  Trim any bushes or trees on the path to your home.  Use bright outdoor lighting.  Clear any walking paths of anything that might make someone trip, such as rocks or tools.  Regularly check to see if handrails are loose or broken. Make sure that both sides of any steps have handrails.  Any raised decks and porches should have guardrails on the edges.  Have any leaves, snow, or ice cleared regularly.  Use sand or salt on walking paths during winter.  Clean up any spills in your garage right away. This includes oil or grease spills. WHAT CAN I DO IN THE BATHROOM?   Use night lights.  Install grab bars by the toilet and in the tub and shower. Do not use towel bars as grab bars.  Use non-skid mats or decals in the tub or shower.  If you need to sit down in the shower, use a plastic, non-slip stool.  Keep the floor dry. Clean up any water that spills on the floor as soon as it happens.  Remove soap buildup in the tub or shower regularly.  Attach bath mats securely with double-sided non-slip rug tape.  Do not have throw rugs and other things on the floor that can make you trip. WHAT CAN I DO IN THE BEDROOM?  Use night lights.  Make sure that you have a light by your bed that is easy to reach.  Do not use any sheets or blankets that are too big for your bed. They should not hang down onto the floor.  Have a firm chair that has side arms. You can use this for support while you get dressed.  Do  not have throw rugs and other things on the floor that can make you trip. WHAT CAN I DO IN THE KITCHEN?  Clean up any spills right away.  Avoid walking on wet floors.  Keep items that you use a lot in easy-to-reach places.  If you need to reach something above you, use a strong step stool that has a grab bar.  Keep electrical cords out of the way.  Do not use floor polish or wax that makes floors slippery. If you must use wax, use non-skid floor wax.  Do not have throw rugs and other things on the floor that  can make you trip. WHAT CAN I DO WITH MY STAIRS?  Do not leave any items on the stairs.  Make sure that there are handrails on both sides of the stairs and use them. Fix handrails that are broken or loose. Make sure that handrails are as long as the stairways.  Check any carpeting to make sure that it is firmly attached to the stairs. Fix any carpet that is loose or worn.  Avoid having throw rugs at the top or bottom of the stairs. If you do have throw rugs, attach them to the floor with carpet tape.  Make sure that you have a light switch at the top of the stairs and the bottom of the stairs. If you do not have them, ask someone to add them for you. WHAT ELSE CAN I DO TO HELP PREVENT FALLS?  Wear shoes that:  Do not have high heels.  Have rubber bottoms.  Are comfortable and fit you well.  Are closed at the toe. Do not wear sandals.  If you use a stepladder:  Make sure that it is fully opened. Do not climb a closed stepladder.  Make sure that both sides of the stepladder are locked into place.  Ask someone to hold it for you, if possible.  Clearly mark and make sure that you can see:  Any grab bars or handrails.  First and last steps.  Where the edge of each step is.  Use tools that help you move around (mobility aids) if they are needed. These include:  Canes.  Walkers.  Scooters.  Crutches.  Turn on the lights when you go into a dark area.  Replace any light bulbs as soon as they burn out.  Set up your furniture so you have a clear path. Avoid moving your furniture around.  If any of your floors are uneven, fix them.  If there are any pets around you, be aware of where they are.  Review your medicines with your doctor. Some medicines can make you feel dizzy. This can increase your chance of falling. Ask your doctor what other things that you can do to help prevent falls.   This information is not intended to replace advice given to you by your health care provider. Make sure you discuss any questions you have with your health care provider.   Document Released: 08/10/2009 Document Revised: 02/28/2015 Document Reviewed: 11/18/2014 Elsevier Interactive Patient Education 2016 [place] Maintenance, Female Adopting a healthy lifestyle and getting preventive care can go a long way to promote health and wellness. Talk with your health care provider about what schedule of regular examinations is right for you. This is a good chance for you to check in with your provider about disease prevention and staying healthy. In between checkups, there are plenty of things you can do on your own. Experts have done a lot of research about which lifestyle changes and preventive measures are most likely to keep you healthy. Ask your health care provider for more information. WEIGHT AND DIET  Eat a healthy diet  Be sure to include plenty of vegetables, fruits, low-fat dairy products, and lean protein.  Do not eat a lot of foods high in solid fats, added sugars, or salt.  Get regular exercise. This is one of the most important things you can do for your health.  Most adults should exercise for at least 150 minutes each week. The exercise should increase your heart rate and make you sweat (  moderate-intensity exercise).  Most adults should also do strengthening exercises at least twice a week. This is in addition to the  moderate-intensity exercise.  Maintain a healthy weight  Body mass index (BMI) is a measurement that can be used to identify possible weight problems. It estimates body fat based on height and weight. Your health care provider can help determine your BMI and help you achieve or maintain a healthy weight.  For females 60 years of age and older:   A BMI below 18.5 is considered underweight.  A BMI of 18.5 to 24.9 is normal.  A BMI of 25 to 29.9 is considered overweight.  A BMI of 30 and above is considered obese.  Watch levels of cholesterol and blood lipids  You should start having your blood tested for lipids and cholesterol at 77 years of age, then have this test every 5 years.  You may need to have your cholesterol levels checked more often if:  Your lipid or cholesterol levels are high.  You are older than 77 years of age.  You are at high risk for heart disease.  CANCER SCREENING   Lung Cancer  Lung cancer screening is recommended for adults 22-84 years old who are at high risk for lung cancer because of a history of smoking.  A yearly low-dose CT scan of the lungs is recommended for people who:  Currently smoke.  Have quit within the past 15 years.  Have at least a 30-pack-year history of smoking. A pack year is smoking an average of one pack of cigarettes a day for 1 year.  Yearly screening should continue until it has been 15 years since you quit.  Yearly screening should stop if you develop a health problem that would prevent you from having lung cancer treatment.  Breast Cancer  Practice breast self-awareness. This means understanding how your breasts normally appear and feel.  It also means doing regular breast self-exams. Let your health care provider know about any changes, no matter how small.  If you are in your 20s or 30s, you should have a clinical breast exam (CBE) by a health care provider every 1-3 years as part of a regular health exam.  If  you are 74 or older, have a CBE every year. Also consider having a breast X-ray (mammogram) every year.  If you have a family history of breast cancer, talk to your health care provider about genetic screening.  If you are at high risk for breast cancer, talk to your health care provider about having an MRI and a mammogram every year.  Breast cancer gene (BRCA) assessment is recommended for women who have family members with BRCA-related cancers. BRCA-related cancers include:  Breast.  Ovarian.  Tubal.  Peritoneal cancers.  Results of the assessment will determine the need for genetic counseling and BRCA1 and BRCA2 testing. Cervical Cancer Your health care provider may recommend that you be screened regularly for cancer of the pelvic organs (ovaries, uterus, and vagina). This screening involves a pelvic examination, including checking for microscopic changes to the surface of your cervix (Pap test). You may be encouraged to have this screening done every 3 years, beginning at age 47.  For women ages 23-65, health care providers may recommend pelvic exams and Pap testing every 3 years, or they may recommend the Pap and pelvic exam, combined with testing for human papilloma virus (HPV), every 5 years. Some types of HPV increase your risk of cervical cancer. Testing for HPV may  also be done on women of any age with unclear Pap test results.  Other health care providers may not recommend any screening for nonpregnant women who are considered low risk for pelvic cancer and who do not have symptoms. Ask your health care provider if a screening pelvic exam is right for you.  If you have had past treatment for cervical cancer or a condition that could lead to cancer, you need Pap tests and screening for cancer for at least 20 years after your treatment. If Pap tests have been discontinued, your risk factors (such as having a new sexual partner) need to be reassessed to determine if screening should  resume. Some women have medical problems that increase the chance of getting cervical cancer. In these cases, your health care provider may recommend more frequent screening and Pap tests. Colorectal Cancer  This type of cancer can be detected and often prevented.  Routine colorectal cancer screening usually begins at 77 years of age and continues through 77 years of age.  Your health care provider may recommend screening at an earlier age if you have risk factors for colon cancer.  Your health care provider may also recommend using home test kits to check for hidden blood in the stool.  A small camera at the end of a tube can be used to examine your colon directly (sigmoidoscopy or colonoscopy). This is done to check for the earliest forms of colorectal cancer.  Routine screening usually begins at age 81.  Direct examination of the colon should be repeated every 5-10 years through 77 years of age. However, you may need to be screened more often if early forms of precancerous polyps or small growths are found. Skin Cancer  Check your skin from head to toe regularly.  Tell your health care provider about any new moles or changes in moles, especially if there is a change in a mole's shape or color.  Also tell your health care provider if you have a mole that is larger than the size of a pencil eraser.  Always use sunscreen. Apply sunscreen liberally and repeatedly throughout the day.  Protect yourself by wearing long sleeves, pants, a wide-brimmed hat, and sunglasses whenever you are outside. HEART DISEASE, DIABETES, AND HIGH BLOOD PRESSURE   High blood pressure causes heart disease and increases the risk of stroke. High blood pressure is more likely to develop in:  People who have blood pressure in the high end of the normal range (130-139/85-89 mm Hg).  People who are overweight or obese.  People who are African American.  If you are 2-38 years of age, have your blood pressure  checked every 3-5 years. If you are 15 years of age or older, have your blood pressure checked every year. You should have your blood pressure measured twice--once when you are at a hospital or clinic, and once when you are not at a hospital or clinic. Record the average of the two measurements. To check your blood pressure when you are not at a hospital or clinic, you can use:  An automated blood pressure machine at a pharmacy.  A home blood pressure monitor.  If you are between 40 years and 53 years old, ask your health care provider if you should take aspirin to prevent strokes.  Have regular diabetes screenings. This involves taking a blood sample to check your fasting blood sugar level.  If you are at a normal weight and have a low risk for diabetes, have this test  once every three years after 77 years of age.  If you are overweight and have a high risk for diabetes, consider being tested at a younger age or more often. PREVENTING INFECTION  Hepatitis B  If you have a higher risk for hepatitis B, you should be screened for this virus. You are considered at high risk for hepatitis B if:  You were born in a country where hepatitis B is common. Ask your health care provider which countries are considered high risk.  Your parents were born in a high-risk country, and you have not been immunized against hepatitis B (hepatitis B vaccine).  You have HIV or AIDS.  You use needles to inject street drugs.  You live with someone who has hepatitis B.  You have had sex with someone who has hepatitis B.  You get hemodialysis treatment.  You take certain medicines for conditions, including cancer, organ transplantation, and autoimmune conditions. Hepatitis C  Blood testing is recommended for:  Everyone born from [birth year] through 1967.  Anyone with known risk factors for hepatitis C. Sexually transmitted infections (STIs)  You should be screened for sexually transmitted infections (STIs)  including gonorrhea and chlamydia if:  You are sexually active and are younger than 77 years of age.  You are older than 77 years of age and your health care provider tells you that you are at risk for this type of infection.  Your sexual activity has changed since you were last screened and you are at an increased risk for chlamydia or gonorrhea. Ask your health care provider if you are at risk.  If you do not have HIV, but are at risk, it may be recommended that you take a prescription medicine daily to prevent HIV infection. This is called pre-exposure prophylaxis (PrEP). You are considered at risk if:  You are sexually active and do not regularly use condoms or know the HIV status of your partner(s).  You take drugs by injection.  You are sexually active with a partner who has HIV. Talk with your health care provider about whether you are at high risk of being infected with HIV. If you choose to begin PrEP, you should first be tested for HIV. You should then be tested every 3 months for as long as you are taking PrEP.  PREGNANCY   If you are premenopausal and you may become pregnant, ask your health care provider about preconception counseling.  If you may become pregnant, take 400 to 800 micrograms (mcg) of folic acid every day.  If you want to prevent pregnancy, talk to your health care provider about birth control (contraception). OSTEOPOROSIS AND MENOPAUSE   Osteoporosis is a disease in which the bones lose minerals and strength with aging. This can result in serious bone fractures. Your risk for osteoporosis can be identified using a bone density scan.  If you are 10 years of age or older, or if you are at risk for osteoporosis and fractures, ask your health care provider if you should be screened.  Ask your health care provider whether you should take a calcium or vitamin D supplement to lower your risk for osteoporosis.  Menopause may have certain physical symptoms and  risks.  Hormone replacement therapy may reduce some of these symptoms and risks. Talk to your health care provider about whether hormone replacement therapy is right for you.  HOME CARE INSTRUCTIONS   Schedule regular health, dental, and eye exams.  Stay current with your immunizations.   Do  not use any tobacco products including cigarettes, chewing tobacco, or electronic cigarettes.  If you are pregnant, do not drink alcohol.  If you are breastfeeding, limit how much and how often you drink alcohol.  Limit alcohol intake to no more than 1 drink per day for nonpregnant women. One drink equals 12 ounces of beer, 5 ounces of wine, or 1 ounces of hard liquor.  Do not use street drugs.  Do not share needles.  Ask your health care provider for help if you need support or information about quitting drugs.  Tell your health care provider if you often feel depressed.  Tell your health care provider if you have ever been abused or do not feel safe at home.   This information is not intended to replace advice given to you by your health care provider. Make sure you discuss any questions you have with your health care provider.   Document Released: 04/29/2011 Document Revised: 11/04/2014 Document Reviewed: 09/15/2013 Elsevier Interactive Patient Education [employer].

## 2016-02-26 NOTE — Telephone Encounter (Signed)
Patient states her RX will not be here until tomorrow or so in the mail. She wanted to know if the doctor would write her a RX for digoxin (LANOXIN) 0.25 MG tablet [id number] for just a couple of pills until it gets here. Please advise.

## 2016-02-26 NOTE — Telephone Encounter (Signed)
Santiago Glad from the lab called with the following:   Critical protime: 54.4  INR is 5.0

## 2016-02-26 NOTE — Telephone Encounter (Signed)
Contacted pt and stated that she has not taken lasix or digoxin for almost a week (has not been delivered yet). She wanted to know if that would effect her INR results.  She also wanted to know when to go to coumadin clinic. She does not have another appt for 6 weeks.

## 2016-02-26 NOTE — Progress Notes (Addendum)
Subjective:   Beth Miller is a 75 y.o. female who presents for Medicare Annual (Subsequent) preventive examination.  Review of Systems:  HRA assessment completed during visit; [name]  The Patient was informed that this wellness visit is to identify risk and educate on how to reduce risk for increase disease through lifestyle changes.   ROS deferred to CPE exam with physician but did have blood work today. Family and medical hx given below; brother had HD: Mother had DM and seizures Brother had pancreatic cancer  Describes health as fair What would make it good? not to have a heart condition and DM  Reviewed DM and pre-diabetes; educated on increasing exercise  Was the kind of person that is always on the go; not as much energy now Started to feel ill Sat the 22nd w sore throat; coughing making her hurt in rib cage; States she is better; Afebrile today and does not report a fever;  Coughing up clear flim; has some nasal drainage; temp; no n and v; temp 97.9 ;using mucinex;   Has been without Digoxin x 6 days  No chest pain; no sob; This was due to mail order not sending timely.,  Went to cardiology and they rx digoxin as ordered to pharmacy locally until mail order comes;  Will pick up and take today. Also has not taken fluid pill; also behind coming from mail order;  suppose to get today or tomorrow Weight 172 last visit in April wt 175;  Per Dr. Alain Marion; To Start Digoxin back as ordered.    Lifestyle review:  HTN: BP in good control  Lipids 03/2015 cho 105; Trig 105; HDL 38 and LDL 46; ratio 3 / discussed increasing exercise to increase HDL  Atrial fib; managed; taking warfarin DM2: A1c 5.9/ educated more on pre-diabetes; numbers are good; with FBS being normal  Vit d deficiency and is taking Vitamin D / will discuss dexa with Dr. Alain Marion but doesn't feel she needs it; Is making sure she gets calcium and taking Vit D; recommended water aerobics if able;    Tobacco: quit x 75 yo  ETOH: no  Medication review/ Adherence: off digoxin x 6 days due to mail order issues; Should start back today and also out of lasix x 2 days; no sob; HR 56 - 60; no issues   BMI: 32 Diet; states she cannot leave sweets alone but actually only eats a few bites of sweets;  Not on diabetic med now; Fasting 102; 104;  Cooks meat; vegetable and starch; Leaves the starches off; eats green vegetables; managed well in coumadin clinic She likes to bake; tries to eat in moderation; Will have 2 bites of coconut pie  Exercise;  Is a caregiver; helps others with finances; (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   HOME SAFETY; one level /  Spouse is in great shape and will be 83 this year; still moves well;   Age in place? Will stay in home;  Removal of clutter clearing paths through the home,   Bathroom safety; Shower in [employer] steps is not an issues; 5 and 6 times a day in and out of home. Community safety; YES Smoke detectors yes; carbon monoxide Firearms safety reviewed and  will keep in a safe place if these exist.  Driving accidents; no; not in the last year; Wears a seatbelt  Advised to use sun protection or large brim hat;  is not out in the sun; cannot tolerate the sun; If she walks; she walks early in am  60 min in general walking 5 to 6 days a week(not all at the same time)    Stressors (1-5) no stress verbalized  Depression: Denies feeling depressed or hopeless; voices pleasure in daily life Mood stable; no  Cognitive; Presents with no issues;  Engaged in assessment Manages checkbook, medications; no failures of task Ad8 score reviewed for issues; . Issues making decisions: no . Less interest in hobbies /  activities:no . Repeats questions, stories; family complaining:no . Trouble using ordinary gadgets; microwave; computer:no . Forgets the month or year:No . Mismanaging finances:No . Missing apt: no . Daily problems with thinking of memory:no   Fall assessment / fell in tub over a year ago;  had muscle pain in neck post fall; not has non skid mat in shower;  No more falls; Had slipped on liquid soap on the shower floor;  Gait assessment appears normal;   Mobilization and Functional losses from last year to this year? no  Sleep pattern changes; didn't sleep last night;    Urinary or fecal incontinence reviewed/  Can't hold bladder like she used;  Dr. Ruthann Cancer; GYN; didn't see him last year; Discussed having pelvic exam even if PAP no longer needed; Had Hyst but still has ovaries.   Counseling Health Maintenance Foot exam; no issues;  Urine microalbumin: Will put order in epic for next blood draw;  A1c drawn today Colonoscopy; 08/2012/ due 11/13/ 2018  EKG: 07/2015  Mammogram 08/31/2015 neg/ [facility] Dexa: due / declines currently PAP: aged out    Hearing: states left ear is not as good; thought to be due to Quinine  Hearing specialist told her the quinine caused her problem; ;2000 hz in left _0  in right; will given resources for hearing aid if needed   Ophthalmology exam; Due now; Will plan to schedule; Dr. Bing Plume    Immunizations Due: TD; Zostavax; reviewed and declines for now  Advanced Directive; NO but has information and will get one; helps others completed   Health Recommendations and Referrals   Current Care Team reviewed and updated   Cardiac Risk Factors include: advanced age (>55mn, >>39women);diabetes mellitus;dyslipidemia;family history of premature cardiovascular disease;obesity (BMI >30kg/m2)     Objective:     Vitals: BP 114/60 mmHg  Pulse 67  Temp(Src) 97.3 F (36.3 C) (Oral)  Ht _1  (1.575 m)  Wt 175 lb 12 oz (79.72 kg)  BMI  32.14 kg/m2  SpO2 96%  Body mass index is 32.14 kg/(m^2).   Tobacco History  Smoking status  . Former Smoker -- 0.02 packs/day for 60 years  . Types: Cigarettes  . Quit date: 10/28/2002  Smokeless tobacco  . Never Used    Comment: quit x 13 years      Counseling given: Yes   Past Medical History  Diagnosis Date  . Hypertension   . Diabetes mellitus   . GERD (gastroesophageal reflux disease)   . AF (atrial fibrillation) ([facility]   . Chronic anticoagulation   . Chest pain   . Hematoma   . Diverticulosis   . Paresthesia   . Cataract, senile   . Disorder of oral soft tissue     of mouth  . Leg  pain   . Sweating   . ALLERGIC RHINITIS   . Low back pain   . Diabetes mellitus   . Status post mitral valve replacement     St. Jude valve  . Vitamin D deficiency   . Blood transfusion without reported diagnosis   . CHF (congestive heart failure) ([place])   . Rheumatic fever   . Stroke [facility])     tia   Past Surgical History  Procedure Laterality Date  . Abdominal hysterectomy    . Cholecystectomy    . Cardiac valve replacement  1995    Mitral valve prosthesis; st jude   Family History  Problem Relation Age of Onset  . Heart disease Brother   . Diabetes Mother   . Seizures Mother   . Other Mother     brain tumor  . Diabetes Brother   . Heart disease Brother   . Pancreatic cancer Brother   . Colon cancer Neg Hx    History  Sexual Activity  . Sexual Activity: Yes    Outpatient Encounter Prescriptions as of 02/26/2016  Medication Sig  . acetaminophen (TYLENOL) 500 MG tablet Take 1,000 mg by mouth every 6 (six) hours as needed for mild pain.  . Alcohol Swabs (B-D SINGLE USE SWABS REGULAR) PADS 1 each by Does not apply route 2 (two) times daily.  Marland Kitchen amoxicillin (AMOXIL) 500 MG tablet Take four tablets one hour prior to dental procedure  . Blood Glucose Monitoring Suppl (ACCU-CHEK AVIVA PLUS) w/Device KIT 1 each by Does not apply route 2 (two) times daily.  .  Cholecalciferol (VITAMIN D3) 1000 UNIT tablet Take 1,000 Units by mouth daily.    . digoxin (LANOXIN) 0.25 MG tablet Take 1 tablet (0.25 mg total) by mouth at bedtime.  Marland Kitchen diltiazem (CARTIA XT) 240 MG 24 hr capsule Take 1 capsule (240 mg total) by mouth daily.  . furosemide (LASIX) 40 MG tablet Take 1 tablet (40 mg total) by mouth at bedtime.  Marland Kitchen glucosamine-chondroitin 500-400 MG tablet Take 1 tablet by mouth daily as needed (joint pain).   Marland Kitchen glucose blood (ACCU-CHEK AVIVA) test strip 1 each by Other route 2 (two) times daily. Use as instructed  . Lancet Devices (ACCU-CHEK SOFTCLIX) lancets 1 each by Other route 2 (two) times daily. Use as instructed  . loratadine (CLARITIN) 10 MG tablet Take 10 mg by mouth daily as needed for allergies.   Marland Kitchen OVER THE COUNTER MEDICATION   . potassium chloride SA (K-DUR,KLOR-CON) 20 MEQ tablet Take 1 tablet (20 mEq total) by mouth daily.  Marland Kitchen triamcinolone cream (KENALOG) 0.5 % Apply 1 application topically 3 (three) times daily. (Patient taking differently: Apply 1 application topically 3 (three) times daily as needed (allergies). )  . vitamin B-12 (CYANOCOBALAMIN) 1000 MCG tablet Take 1,000 mcg by mouth daily as needed (energy).  . warfarin (COUMADIN) 10 MG tablet Take 1 tablet (10 mg total) by mouth daily.  Marland Kitchen warfarin (COUMADIN) 5 MG tablet Take 1 tablet (5 mg total) by mouth daily.  . Blood Glucose Calibration (ACCU-CHEK AVIVA) SOLN 1 each by In Vitro route as directed.  . donepezil (ARICEPT) 5 MG tablet Take 1 tablet (5 mg total) by mouth at bedtime. (Patient not taking: Reported on 02/26/2016)  . HYDROcodone-acetaminophen (NORCO/VICODIN) 5-325 MG tablet Take 0.5-1 tablets by mouth 2 (two) times daily as needed for severe pain. (Patient not taking: Reported on 02/26/2016)  . tiZANidine (ZANAFLEX) 4 MG tablet TAKE 1 TABLET EVERY 8 HOURS AS NEEDED FOR  MUSCLE SPASMS (Patient not taking: Reported on 02/26/2016)   No facility-administered encounter medications on file as of  02/26/2016.    Activities of Daily Living In your present state of health, do you have any difficulty performing the following activities: 02/26/2016  Hearing? N  Vision? N  Difficulty concentrating or making decisions? N  Walking or climbing stairs? N  Dressing or bathing? N  Doing errands, shopping? N  Preparing Food and eating ? N  Using the Toilet? N  In the past six months, have you accidently leaked urine? Y  Do you have problems with loss of bowel control? N  Managing your Medications? N  Managing your Finances? N  Housekeeping or managing your Housekeeping? N    Patient Care Team: Cassandria Anger, MD as PCP - General Josue Hector, MD (Cardiology) Frederico Hamman, MD (Obstetrics and Gynecology)    Assessment:     Exercise Activities and Dietary recommendations Current Exercise Habits: Home exercise routine, Time (Minutes): 60, Frequency (Times/Week): 4, Weekly Exercise (Minutes/Week): 240, Intensity: Mild (stays up and busy; socially engaged; will try to add water aerobics )  Goals    . Weight < 200 lb (90.719 kg)     Watch sweets Controllable  risk for heart disease reviewed for goal setting: I Heart Healthy Diet; less sugar; less fat; more exercise   Mediterranean Diet: eating primarily plant-based food such as fruits and vegetables, whole grains, legumes and nuts; replacing butter with healthy fats such as olive oil and canola oil Using herbs and spices instead of salt to flavor food Limiting red meat to no more than a few times a month Eating fish and poultry at least 2 times a week Getting plenty of exercise  2 things help pre-diabetes; exercise (30 min of walking ) or water aerobics;  Cut back on sugar             Fall Risk Fall Risk  02/26/2016 07/19/2015  Falls in the past year? No Yes  Number falls in past yr: - 1  Injury with Fall? - Yes   Depression Screen PHQ 2/9 Scores 02/26/2016 08/18/2015 07/19/2015  PHQ - 2 Score 0 0 0      Cognitive Testing No flowsheet data found.  AD8 score 0   Immunization History  Administered Date(s) Administered  . H1N1 10/03/2008  . Influenza Split 09/12/2011, 09/28/2012  . Influenza Whole 10/04/2004, 10/14/2007, 09/04/2010  . Influenza,inj,Quad PF,36+ Mos 07/20/2013, 11/11/2014, 07/19/2015  . Pneumococcal Conjugate-13 04/28/2014  . Pneumococcal Polysaccharide-23 10/03/2006   Screening Tests Health Maintenance  Topic Date Due  . FOOT EXAM  08/09/1949  . URINE MICROALBUMIN  08/09/1949  . TETANUS/TDAP  08/09/1958  . ZOSTAVAX  08/10/1999  . DEXA SCAN  08/09/2004  . OPHTHALMOLOGY EXAM  07/22/2013  . HEMOGLOBIN A1C  10/12/2015  . INFLUENZA VACCINE  05/28/2016  . COLONOSCOPY  09/09/2017  . PNA vac Low Risk Adult  Completed      Plan:      Plans to go to Water aerobics 2 times a week;  Will cut back on sugar  Given information regarding free hearing aid for Div of HOH  Some Bladder issues but does not want any more medications Educated regarding Kagel exercises; 20 x 3 time a day  Will restart Digoxin today and Lasix should come today or tomorrow.    During the course of the visit the patient was educated and counseled about the following appropriate screening and preventive services:  Vaccines to include Pneumoccal, Influenza, Hepatitis B, Td, Zostavax, HCV' declines vaccination; Will come in and get tetanus if she gets a splinter or other cut;   Electrocardiogram /02/2014  Cardiovascular Disease/ obesity; exercise more  Colorectal cancer screening; 08/2012/ due 11/13/ 2018   Bone density screening/ declined  Diabetes screening / ongoing  Glaucoma screening/ to be scheduled with Dr. Bing Plume  Mammography/08/2015 at the [facility] center  Nutrition counseling / discussed; decrease sugar   Patient Instructions (the written plan) was given to the patient.   VSYVG,CYOYO, RN  02/26/2016     Medical screening examination/treatment/procedure(s) were performed  by non-physician practitioner and as supervising physician I was immediately available for consultation/collaboration. I agree with above. Walker Kehr, MD

## 2016-02-27 ENCOUNTER — Ambulatory Visit: Payer: Commercial Managed Care - HMO

## 2016-03-29 ENCOUNTER — Ambulatory Visit: Payer: Commercial Managed Care - HMO

## 2016-04-30 NOTE — Progress Notes (Signed)
Patient ID: Beth Miller, female   DOB: Apr 13, 1941, 75 y.o.   MRN: [id number] Beth Miller is seen for f/u MVR  Has multiple issues which include a prosthetic MV - dating back to 1995, chronic atrial fib, on coumadin and carotid disease. She is obese. Past smoker and she is diabetic.   F/U myovue done 01/18/13 normal no gating due to afib  Echo 01/13/13 normal EF and MVR normal Study Conclusions  - Left ventricle: The cavity size was normal. Wall thickness was increased in a pattern of mild LVH. The estimated ejection fraction was 55%. Wall motion was normal; there were no regional wall motion abnormalities. - Aortic valve: The valve appears to be grossly normal. Mild regurgitation. - Mitral valve: Mechanical mitral prosthesis is working well. Mean gradient: 74m Hg (D). Peak gradient: 846mHg (D). - Left atrium: The atrium was moderately dilated. - Right ventricle: The cavity size was mildly dilated. Systolic function was mildly reduced. - Right atrium: The atrium was moderately dilated. - Tricuspid valve: Mild-moderate regurgitation. - Pulmonary arteries: PA peak pressure: 34103mg (S).  Carotid 04/06/14 plaque no stenosis f/u 6/17 Multinodular goiter  Biopsy ok   Last visit calcium blocker stopped and beta blocker added for rate control She felt shakey on this  And wanted to go back to cardizem last October   ROS: Denies fever, malais, weight loss, blurry vision, decreased visual acuity, cough, sputum, SOB, hemoptysis, pleuritic pain, palpitaitons, heartburn, abdominal pain, melena, lower extremity edema, claudication, or rash.  All other systems reviewed and negative  General: Affect appropriate Healthy:  appears stated age HEE14ormal Neck supple with no adenopathy JVP normal right  bruits no thyromegaly Lungs clear with no wheezing and good diaphragmatic motion Heart:  S1/T4/ick S2 no murmur, no rub, gallop or click PMI normal Abdomen: benighn, BS positve, no tenderness, no  AAA no bruit.  No HSM or HJR Distal pulses intact with no bruits No edema Neuro non-focal Skin warm and dry No muscular weakness   Current Outpatient Prescriptions  Medication Sig Dispense Refill  . acetaminophen (TYLENOL) 500 MG tablet Take 1,000 mg by mouth every 6 (six) hours as needed for mild pain.    . Alcohol Swabs (B-D SINGLE USE SWABS REGULAR) PADS 1 each by Does not apply route 2 (two) times daily. 100 each 5  . amoxicillin (AMOXIL) 500 MG tablet Take four tablets one hour prior to dental procedure 12 tablet 1  . Blood Glucose Calibration (ACCU-CHEK AVIVA) SOLN 1 each by In Vitro route as directed. 1 each 2  . Blood Glucose Monitoring Suppl (ACCU-CHEK AVIVA PLUS) w/Device KIT 1 each by Does not apply route 2 (two) times daily. 1 kit 0  . Cholecalciferol (VITAMIN D3) 1000 UNIT tablet Take 1,000 Units by mouth daily.      . digoxin (LANOXIN) 0.25 MG tablet Take 1 tablet (0.25 mg total) by mouth at bedtime. 7 tablet 0  . diltiazem (CARTIA XT) 240 MG 24 hr capsule Take 1 capsule (240 mg total) by mouth daily. 90 capsule 3  . donepezil (ARICEPT) 5 MG tablet Take 1 tablet (5 mg total) by mouth at bedtime. (Patient not taking: Reported on 02/26/2016) 30 tablet 11  . furosemide (LASIX) 40 MG tablet Take 1 tablet (40 mg total) by mouth at bedtime. 90 tablet 3  . glucosamine-chondroitin 500-400 MG tablet Take 1 tablet by mouth daily as needed (joint pain).     . gMarland Kitchenucose blood (ACCU-CHEK AVIVA) test strip 1 each  by Other route 2 (two) times daily. Use as instructed 100 each 5  . HYDROcodone-acetaminophen (NORCO/VICODIN) 5-325 MG tablet Take 0.5-1 tablets by mouth 2 (two) times daily as needed for severe pain. (Patient not taking: Reported on 02/26/2016) 60 tablet 0  . Lancet Devices (ACCU-CHEK SOFTCLIX) lancets 1 each by Other route 2 (two) times daily. Use as instructed 1 each 5  . loratadine (CLARITIN) 10 MG tablet Take 10 mg by mouth daily as needed for allergies.     Marland Kitchen OVER THE COUNTER  MEDICATION     . potassium chloride SA (K-DUR,KLOR-CON) 20 MEQ tablet Take 1 tablet (20 mEq total) by mouth daily. 90 tablet 3  . tiZANidine (ZANAFLEX) 4 MG tablet TAKE 1 TABLET EVERY 8 HOURS AS NEEDED FOR MUSCLE SPASMS (Patient not taking: Reported on 02/26/2016) 90 tablet 3  . triamcinolone cream (KENALOG) 0.5 % Apply 1 application topically 3 (three) times daily. (Patient taking differently: Apply 1 application topically 3 (three) times daily as needed (allergies). ) 30 g 2  . vitamin B-12 (CYANOCOBALAMIN) 1000 MCG tablet Take 1,000 mcg by mouth daily as needed (energy).    . warfarin (COUMADIN) 10 MG tablet Take 1 tablet (10 mg total) by mouth daily. 90 tablet 3  . warfarin (COUMADIN) 5 MG tablet Take 1 tablet (5 mg total) by mouth daily. 90 tablet 3   No current facility-administered medications for this visit.    Allergies  Food; Furosemide; Quinine; Spironolactone; Tramadol; and Sulfadiazine  Electrocardiogram: 03/04/14  Afib rate 99 nonspecific ST/T wave change rate a little higher than  2014  At 66 03/29/15 afib rate 119  Nonspecific St changes   Assessment and Plan Afib:  Good anticoagulaiton seems to be forgetting to take meds from time to time  Anticoagulaiton:  INR RX no bleeding issues  Consider lovenox bridging if stopped due to afib and mechanical MVR Goiter:  Stable biopsy negative for tumor HTN:  Well controlled.  Continue current medications and low sodium Dash type diet.   Carotid: Plaque on duplex 6/15  F/u next June no murmur  MVR:  Normal valve click  F/u echo next June   F/U with me 6 months  No tests   Beth Miller

## 2016-05-02 ENCOUNTER — Ambulatory Visit (INDEPENDENT_AMBULATORY_CARE_PROVIDER_SITE_OTHER): Payer: Commercial Managed Care - HMO | Admitting: Cardiovascular Disease

## 2016-05-02 DIAGNOSIS — I359 Nonrheumatic aortic valve disorder, unspecified: Secondary | ICD-10-CM | POA: Diagnosis not present

## 2016-05-02 DIAGNOSIS — R0989 Other specified symptoms and signs involving the circulatory and respiratory systems: Secondary | ICD-10-CM

## 2016-05-08 ENCOUNTER — Encounter: Payer: Self-pay | Admitting: Internal Medicine

## 2016-05-08 ENCOUNTER — Ambulatory Visit (INDEPENDENT_AMBULATORY_CARE_PROVIDER_SITE_OTHER): Payer: Commercial Managed Care - HMO | Admitting: General Practice

## 2016-05-08 ENCOUNTER — Encounter: Payer: Self-pay | Admitting: Cardiovascular Disease

## 2016-05-08 ENCOUNTER — Ambulatory Visit (INDEPENDENT_AMBULATORY_CARE_PROVIDER_SITE_OTHER): Payer: Commercial Managed Care - HMO | Admitting: Internal Medicine

## 2016-05-08 ENCOUNTER — Other Ambulatory Visit (INDEPENDENT_AMBULATORY_CARE_PROVIDER_SITE_OTHER): Payer: Commercial Managed Care - HMO

## 2016-05-08 VITALS — BP 100/56 | HR 69 | Temp 97.5°F | Wt 170.0 lb

## 2016-05-08 DIAGNOSIS — I10 Essential (primary) hypertension: Secondary | ICD-10-CM | POA: Diagnosis not present

## 2016-05-08 DIAGNOSIS — R209 Unspecified disturbances of skin sensation: Secondary | ICD-10-CM | POA: Diagnosis not present

## 2016-05-08 DIAGNOSIS — E119 Type 2 diabetes mellitus without complications: Secondary | ICD-10-CM

## 2016-05-08 DIAGNOSIS — R413 Other amnesia: Secondary | ICD-10-CM | POA: Diagnosis not present

## 2016-05-08 DIAGNOSIS — I4891 Unspecified atrial fibrillation: Secondary | ICD-10-CM | POA: Diagnosis not present

## 2016-05-08 DIAGNOSIS — R5382 Chronic fatigue, unspecified: Secondary | ICD-10-CM | POA: Diagnosis not present

## 2016-05-08 DIAGNOSIS — Z7901 Long term (current) use of anticoagulants: Secondary | ICD-10-CM

## 2016-05-08 DIAGNOSIS — R5383 Other fatigue: Secondary | ICD-10-CM | POA: Insufficient documentation

## 2016-05-08 DIAGNOSIS — Z5181 Encounter for therapeutic drug level monitoring: Secondary | ICD-10-CM

## 2016-05-08 DIAGNOSIS — Z23 Encounter for immunization: Secondary | ICD-10-CM | POA: Diagnosis not present

## 2016-05-08 DIAGNOSIS — R202 Paresthesia of skin: Secondary | ICD-10-CM | POA: Diagnosis not present

## 2016-05-08 DIAGNOSIS — Z9889 Other specified postprocedural states: Secondary | ICD-10-CM

## 2016-05-08 LAB — POCT INR: INR: 2

## 2016-05-08 LAB — BASIC METABOLIC PANEL
BUN: 11 mg/dL (ref 6–23)
CALCIUM: 9.9 mg/dL (ref 8.4–10.5)
CO2: 31 mEq/L (ref 19–32)
CREATININE: 0.89 mg/dL (ref 0.40–1.20)
Chloride: 104 mEq/L (ref 96–112)
GFR: 79.15 mL/min (ref >60.00)
Glucose, Bld: 119 mg/dL — ABNORMAL HIGH (ref 70–99)
Potassium: 4 mEq/L (ref 3.5–5.1)
Sodium: 141 mEq/L (ref 135–145)

## 2016-05-08 LAB — IBC PANEL
IRON: 61 ug/dL (ref 42–145)
SATURATION RATIOS: 16.6 % — AB (ref 20.0–50.0)
TRANSFERRIN: 263 mg/dL (ref 212.0–360.0)

## 2016-05-08 LAB — HEPATIC FUNCTION PANEL
ALBUMIN: 4.2 g/dL (ref 3.5–5.2)
ALT: 11 U/L (ref 0–35)
AST: 18 U/L (ref 0–37)
Alkaline Phosphatase: 72 U/L (ref 39–117)
BILIRUBIN DIRECT: 0.2 mg/dL (ref 0.0–0.3)
TOTAL PROTEIN: 7.7 g/dL (ref 6.0–8.3)
Total Bilirubin: 0.5 mg/dL (ref 0.2–1.2)

## 2016-05-08 LAB — CBC WITH DIFFERENTIAL/PLATELET
BASOS PCT: 0.7 % (ref 0.0–3.0)
Basophils Absolute: 0 10*3/uL (ref 0.0–0.1)
EOS ABS: 0.1 10*3/uL (ref 0.0–0.7)
EOS PCT: 1.7 % (ref 0.0–5.0)
HEMATOCRIT: 37.1 % (ref 36.0–46.0)
HEMOGLOBIN: 12.4 g/dL (ref 12.0–15.0)
LYMPHS PCT: 26.5 % (ref 12.0–46.0)
Lymphs Abs: 1.6 10*3/uL (ref 0.7–4.0)
MCHC: 33.4 g/dL (ref 30.0–36.0)
MCV: 83.7 fl (ref 78.0–100.0)
Monocytes Absolute: 0.7 10*3/uL (ref 0.1–1.0)
Monocytes Relative: 12.3 % — ABNORMAL HIGH (ref 3.0–12.0)
NEUTROS ABS: 3.6 10*3/uL (ref 1.4–7.7)
Neutrophils Relative %: 58.8 % (ref 43.0–77.0)
PLATELETS: 287 10*3/uL (ref 150.0–400.0)
RBC: 4.43 Mil/uL (ref 3.87–5.11)
RDW: 15.8 % — AB (ref 11.5–15.5)
WBC: 6.1 10*3/uL (ref 4.0–10.5)

## 2016-05-08 LAB — VITAMIN B12

## 2016-05-08 MED ORDER — DILTIAZEM HCL ER COATED BEADS 120 MG PO CP24: 120.0000 mg | ORAL_CAPSULE | Freq: Every day | ORAL | Status: DC

## 2016-05-08 NOTE — Assessment & Plan Note (Signed)
Recurrent ?etiology Labs Reduce Cartia XT to 120 mg a day due to brady and low BP Digoxin level

## 2016-05-08 NOTE — Assessment & Plan Note (Signed)
  On diet  

## 2016-05-08 NOTE — Assessment & Plan Note (Signed)
Cartia XT dose was reduced

## 2016-05-08 NOTE — Progress Notes (Signed)
I have reviewed and agree with the plan. 

## 2016-05-08 NOTE — Assessment & Plan Note (Addendum)
Risks associated with testing INR monthly noncompliance were discussed. Compliance was encouraged.

## 2016-05-08 NOTE — Assessment & Plan Note (Signed)
Pt stopped Aricept

## 2016-05-08 NOTE — Progress Notes (Signed)
Pre visit review using our clinic review tool, if applicable. No additional management support is needed unless otherwise documented below in the visit note. 

## 2016-05-08 NOTE — Assessment & Plan Note (Signed)
Labs w/B12

## 2016-05-08 NOTE — Progress Notes (Signed)
Subjective:  Patient ID: Beth Miller, female    DOB: 11-15-1940  Age: 75 y.o. MRN: [id number]  CC: No chief complaint on file.   HPI Beth Miller presents for fatigue, nausea, bloating x weeks - not feeling well. No new meds. C/o pins and needles feeling...  Outpatient Prescriptions Prior to Visit  Medication Sig Dispense Refill  . acetaminophen (TYLENOL) 500 MG tablet Take 1,000 mg by mouth every 6 (six) hours as needed for mild pain.    . Alcohol Swabs (B-D SINGLE USE SWABS REGULAR) PADS 1 each by Does not apply route 2 (two) times daily. 100 each 5  . amoxicillin (AMOXIL) 500 MG tablet Take four tablets one hour prior to dental procedure 12 tablet 1  . Blood Glucose Calibration (ACCU-CHEK AVIVA) SOLN 1 each by In Vitro route as directed. 1 each 2  . Blood Glucose Monitoring Suppl (ACCU-CHEK AVIVA PLUS) w/Device KIT 1 each by Does not apply route 2 (two) times daily. 1 kit 0  . Cholecalciferol (VITAMIN D3) 1000 UNIT tablet Take 1,000 Units by mouth daily.      . digoxin (LANOXIN) 0.25 MG tablet Take 1 tablet (0.25 mg total) by mouth at bedtime. 7 tablet 0  . diltiazem (CARTIA XT) 240 MG 24 hr capsule Take 1 capsule (240 mg total) by mouth daily. 90 capsule 3  . donepezil (ARICEPT) 5 MG tablet Take 1 tablet (5 mg total) by mouth at bedtime. 30 tablet 11  . furosemide (LASIX) 40 MG tablet Take 1 tablet (40 mg total) by mouth at bedtime. 90 tablet 3  . glucosamine-chondroitin 500-400 MG tablet Take 1 tablet by mouth daily as needed (joint pain).     Marland Kitchen glucose blood (ACCU-CHEK AVIVA) test strip 1 each by Other route 2 (two) times daily. Use as instructed 100 each 5  . HYDROcodone-acetaminophen (NORCO/VICODIN) 5-325 MG tablet Take 0.5-1 tablets by mouth 2 (two) times daily as needed for severe pain. 60 tablet 0  . Lancet Devices (ACCU-CHEK SOFTCLIX) lancets 1 each by Other route 2 (two) times daily. Use as instructed 1 each 5  . loratadine (CLARITIN) 10 MG tablet Take 10 mg by mouth  daily as needed for allergies.     Marland Kitchen OVER THE COUNTER MEDICATION     . potassium chloride SA (K-DUR,KLOR-CON) 20 MEQ tablet Take 1 tablet (20 mEq total) by mouth daily. 90 tablet 3  . tiZANidine (ZANAFLEX) 4 MG tablet TAKE 1 TABLET EVERY 8 HOURS AS NEEDED FOR MUSCLE SPASMS 90 tablet 3  . triamcinolone cream (KENALOG) 0.5 % Apply 1 application topically 3 (three) times daily. (Patient taking differently: Apply 1 application topically 3 (three) times daily as needed (allergies). ) 30 g 2  . vitamin B-12 (CYANOCOBALAMIN) 1000 MCG tablet Take 1,000 mcg by mouth daily as needed (energy).    . warfarin (COUMADIN) 10 MG tablet Take 1 tablet (10 mg total) by mouth daily. 90 tablet 3  . warfarin (COUMADIN) 5 MG tablet Take 1 tablet (5 mg total) by mouth daily. 90 tablet 3   No facility-administered medications prior to visit.    ROS Review of Systems  Constitutional: Positive for diaphoresis and fatigue. Negative for chills, activity change, appetite change and unexpected weight change.  HENT: Negative for congestion, mouth sores and sinus pressure.   Eyes: Negative for visual disturbance.  Respiratory: Negative for cough and chest tightness.   Gastrointestinal: Negative for nausea and abdominal pain.  Genitourinary: Negative for frequency, difficulty urinating and vaginal pain.  Musculoskeletal: Negative for back pain and gait problem.  Skin: Negative for pallor and rash.  Neurological: Negative for dizziness, tremors, weakness, numbness and headaches.  Psychiatric/Behavioral: Negative for behavioral problems, confusion and sleep disturbance. The patient is not nervous/anxious.     Objective:  BP 100/56 mmHg  Pulse 69  Temp(Src) 97.5 F (36.4 C) (Oral)  Wt 170 lb (77.111 kg)  SpO2 97%  BP Readings from Last 3 Encounters:  05/08/16 100/56  02/26/16 114/60  02/16/16 92/70    Wt Readings from Last 3 Encounters:  05/08/16 170 lb (77.111 kg)  02/26/16 175 lb 12 oz (79.72 kg)  02/16/16  172 lb (78.019 kg)    Physical Exam  Constitutional: She appears well-developed. No distress.  HENT:  Head: Normocephalic.  Right Ear: External ear normal.  Left Ear: External ear normal.  Nose: Nose normal.  Mouth/Throat: Oropharynx is clear and moist.  Eyes: Conjunctivae are normal. Pupils are equal, round, and reactive to light. Right eye exhibits no discharge. Left eye exhibits no discharge.  Neck: Normal range of motion. Neck supple. No JVD present. No tracheal deviation present. No thyromegaly present.  Cardiovascular:  Murmur heard. Pulmonary/Chest: No stridor. No respiratory distress. She has no wheezes.  Abdominal: Soft. Bowel sounds are normal. She exhibits no distension and no mass. There is no tenderness. There is no rebound and no guarding.  Musculoskeletal: She exhibits no edema or tenderness.  Lymphadenopathy:    She has no cervical adenopathy.  Neurological: She displays normal reflexes. No cranial nerve deficit. She exhibits normal muscle tone. Coordination normal.  Skin: No rash noted. No erythema.  Psychiatric: She has a normal mood and affect. Her behavior is normal. Judgment and thought content normal.  valve click irreg RR  Lab Results  Component Value Date   WBC 5.7 02/26/2016   HGB 10.9* 02/26/2016   HCT 32.1* 02/26/2016   PLT 266.0 02/26/2016   GLUCOSE 86 08/18/2015   CHOL 105 04/12/2015   TRIG 105.0 04/12/2015   HDL 38.50* 04/12/2015   LDLCALC 46 04/12/2015   ALT 18 04/12/2015   AST 22 04/12/2015   NA 143 08/18/2015   K 4.3 08/18/2015   CL 105 08/18/2015   CREATININE 0.90 08/18/2015   BUN 9 08/18/2015   CO2 28 08/18/2015   TSH 2.37 04/12/2015   INR 2.0 05/08/2016   HGBA1C 6.3 02/26/2016    Mm Screening Breast Tomo Bilateral  09/01/2015  CLINICAL DATA:  Screening. EXAM: DIGITAL SCREENING BILATERAL MAMMOGRAM WITH 3D TOMO WITH CAD COMPARISON:  Previous exam(s). ACR Breast Density Category b: There are scattered areas of fibroglandular density.  FINDINGS: There are no findings suspicious for malignancy. Images were processed with CAD. IMPRESSION: No mammographic evidence of malignancy. A result letter of this screening mammogram will be mailed directly to the patient. RECOMMENDATION: Screening mammogram in one year. (Code:SM-B-01Y) BI-RADS CATEGORY  1: Negative. Electronically Signed   By: Skipper Cliche M.D.   On: 09/01/2015 13:39    Assessment & Plan:   There are no diagnoses linked to this encounter. I am having Ms. Horseman maintain her glucosamine-chondroitin, loratadine, cholecalciferol, acetaminophen, vitamin B-12, triamcinolone cream, tiZANidine, donepezil, B-D SINGLE USE SWABS REGULAR, ACCU-CHEK AVIVA, ACCU-CHEK AVIVA PLUS, glucose blood, accu-chek softclix, diltiazem, potassium chloride SA, warfarin, warfarin, furosemide, amoxicillin, HYDROcodone-acetaminophen, OVER THE COUNTER MEDICATION, and digoxin.  No orders of the defined types were placed in this encounter.     Follow-up: No Follow-up on file.  Walker Kehr, MD

## 2016-05-09 LAB — DIGOXIN LEVEL: DIGOXIN LVL: 1 ug/L (ref 0.8–2.0)

## 2016-05-15 ENCOUNTER — Encounter: Payer: Self-pay | Admitting: Cardiovascular Disease

## 2016-05-21 ENCOUNTER — Encounter: Payer: Self-pay | Admitting: Cardiovascular Disease

## 2016-06-03 NOTE — Progress Notes (Signed)
Patient ID: Beth Miller, female   DOB: Dec 11, 1940, 75 y.o.   MRN: [id number] Beth Miller is seen for f/u MVR  Has multiple issues which include a prosthetic MV - dating back to 1995, chronic atrial fib, on coumadin and carotid disease. She is obese. Past smoker and she is diabetic.   F/U myovue done 01/18/13 normal no gating due to afib  Echo 01/13/13 normal EF and MVR normal Study Conclusions  - Left ventricle: The cavity size was normal. Wall thickness was increased in a pattern of mild LVH. The estimated ejection fraction was 55%. Wall motion was normal; there were no regional wall motion abnormalities. - Aortic valve: The valve appears to be grossly normal. Mild regurgitation. - Mitral valve: Mechanical mitral prosthesis is working well. Mean gradient: 104m Hg (D). Peak gradient: 830mHg (D). - Left atrium: The atrium was moderately dilated. - Right ventricle: The cavity size was mildly dilated. Systolic function was mildly reduced. - Right atrium: The atrium was moderately dilated. - Tricuspid valve: Mild-moderate regurgitation. - Pulmonary arteries: PA peak pressure: 3468mg (S).  Carotid 04/06/14 plaque no stenosis f/u 6/17 Multinodular goiter  Biopsy ok   Last visit calcium blocker stopped and beta blocker added for rate control She felt shakey on this  And wanted to go back to cardizem last October   Enjoying her bowling [place]  ROS: Denies fever, malais, weight loss, blurry vision, decreased visual acuity, cough, sputum, SOB, hemoptysis, pleuritic pain, palpitaitons, heartburn, abdominal pain, melena, lower extremity edema, claudication, or rash.  All other systems reviewed and negative  General: Affect appropriate Healthy:  appears stated age [id number] Neck supple with no adenopathy JVP normal right  bruits no thyromegaly Lungs clear with no wheezing and good diaphragmatic motion Heart:  S1/P9/ick S2 no murmur, no  rub, gallop or click PMI normal Abdomen: benighn, BS positve, no tenderness, no AAA no bruit.  No HSM or HJR Distal pulses intact with no bruits No edema Neuro non-focal Skin warm and dry No muscular weakness   Current Outpatient Prescriptions  Medication Sig Dispense Refill  . acetaminophen (TYLENOL) 500 MG tablet Take 1,000 mg by mouth every 6 (six) hours as needed for mild pain.    . Alcohol Swabs (B-D SINGLE USE SWABS REGULAR) PADS 1 each by Does not apply route 2 (two) times daily. 100 each 5  . amoxicillin (AMOXIL) 500 MG tablet Take four tablets one hour prior to dental procedure 12 tablet 1  . Blood Glucose Calibration (ACCU-CHEK AVIVA) SOLN 1 each by In Vitro route as directed. 1 each 2  . Blood Glucose Monitoring Suppl (ACCU-CHEK AVIVA PLUS) w/Device KIT 1 each by Does not apply route 2 (two) times daily. 1 kit 0  . Cholecalciferol (VITAMIN D3) 1000 UNIT tablet Take 1,000 Units by mouth daily.      . digoxin (LANOXIN) 0.25 MG tablet Take 1 tablet (0.25 mg total) by mouth at bedtime. 7 tablet 0  . diltiazem (CARDIZEM CD) 120 MG 24 hr capsule Take 1 capsule (120 mg total) by mouth daily. 90 capsule 3  . furosemide (LASIX) 40 MG tablet Take 1 tablet (40 mg total) by mouth at bedtime. 90 tablet 3  . glucosamine-chondroitin 500-400 MG tablet Take 1 tablet by mouth daily as needed (joint pain).     . gMarland Kitchenucose blood (ACCU-CHEK AVIVA) test strip 1 each by Other route 2 (two) times daily. Use as instructed 100 each  5  . HYDROcodone-acetaminophen (NORCO/VICODIN) 5-325 MG tablet Take 0.5-1 tablets by mouth 2 (two) times daily as needed for severe pain. 60 tablet 0  . Lancet Devices (ACCU-CHEK SOFTCLIX) lancets 1 each by Other route 2 (two) times daily. Use as instructed 1 each 5  . loratadine (CLARITIN) 10 MG tablet Take 10 mg by mouth daily as needed for allergies.     Marland Kitchen OVER THE COUNTER MEDICATION Take 1 tablet by mouth as needed (joint pain).     . potassium chloride SA (K-DUR,KLOR-CON)  20 MEQ tablet Take 1 tablet (20 mEq total) by mouth daily. 90 tablet 3  . vitamin B-12 (CYANOCOBALAMIN) 1000 MCG tablet Take 1,000 mcg by mouth daily as needed (energy).    . warfarin (COUMADIN) 10 MG tablet Take 1 tablet (10 mg total) by mouth daily. 90 tablet 3  . warfarin (COUMADIN) 5 MG tablet Take 1 tablet (5 mg total) by mouth daily. 90 tablet 3   No current facility-administered medications for this visit.     Allergies  Food; Furosemide; Quinine; Spironolactone; Tramadol; and Sulfadiazine  Electrocardiogram: 03/04/14  Afib rate 99 nonspecific ST/T wave change rate a little higher than  2014  At 66 03/29/15 afib rate 119  Nonspecific St changes  06/04/16 Afib rate 98 nonspecific ST changes   Assessment and Plan Afib:  Good anticoagulaiton seems to be forgetting to take meds from time to time  Anticoagulaiton:  INR RX no bleeding issues  Consider lovenox bridging if stopped due to afib and mechanical MVR Goiter:  Stable biopsy negative for tumor HTN:  Well controlled.  Continue current medications and low sodium Dash type diet.   Carotid: Plaque on duplex 6/15  F/u next June no murmur  MVR:  Normal valve click  F/u echo since its been over 3 years   F/u with me in year Echo ordered   [employer]

## 2016-06-04 ENCOUNTER — Encounter: Payer: Self-pay | Admitting: Cardiovascular Disease

## 2016-06-04 ENCOUNTER — Ambulatory Visit (INDEPENDENT_AMBULATORY_CARE_PROVIDER_SITE_OTHER): Payer: Commercial Managed Care - HMO | Admitting: Cardiovascular Disease

## 2016-06-04 VITALS — BP 121/72 | HR 94 | Ht 62.0 in | Wt 172.0 lb

## 2016-06-04 DIAGNOSIS — Z952 Presence of prosthetic heart valve: Secondary | ICD-10-CM

## 2016-06-04 DIAGNOSIS — Z954 Presence of other heart-valve replacement: Secondary | ICD-10-CM | POA: Diagnosis not present

## 2016-06-04 DIAGNOSIS — I4891 Unspecified atrial fibrillation: Secondary | ICD-10-CM | POA: Diagnosis not present

## 2016-06-04 NOTE — Patient Instructions (Addendum)

## 2016-06-05 ENCOUNTER — Encounter: Payer: Self-pay | Admitting: Cardiovascular Disease

## 2016-06-05 ENCOUNTER — Ambulatory Visit: Payer: Commercial Managed Care - HMO

## 2016-06-13 ENCOUNTER — Ambulatory Visit (HOSPITAL_COMMUNITY): Payer: Commercial Managed Care - HMO | Attending: Cardiovascular Disease

## 2016-06-13 DIAGNOSIS — R0989 Other specified symptoms and signs involving the circulatory and respiratory systems: Secondary | ICD-10-CM

## 2016-07-03 ENCOUNTER — Encounter: Payer: Self-pay | Admitting: Internal Medicine

## 2016-07-03 ENCOUNTER — Ambulatory Visit (HOSPITAL_COMMUNITY): Payer: Commercial Managed Care - HMO | Attending: Internal Medicine

## 2016-07-03 ENCOUNTER — Other Ambulatory Visit: Payer: Self-pay

## 2016-07-03 DIAGNOSIS — I119 Hypertensive heart disease without heart failure: Secondary | ICD-10-CM | POA: Insufficient documentation

## 2016-07-03 DIAGNOSIS — I482 Chronic atrial fibrillation: Secondary | ICD-10-CM | POA: Diagnosis not present

## 2016-07-03 DIAGNOSIS — E669 Obesity, unspecified: Secondary | ICD-10-CM | POA: Insufficient documentation

## 2016-07-03 DIAGNOSIS — Z87891 Personal history of nicotine dependence: Secondary | ICD-10-CM | POA: Insufficient documentation

## 2016-07-03 DIAGNOSIS — I4891 Unspecified atrial fibrillation: Secondary | ICD-10-CM | POA: Diagnosis not present

## 2016-07-03 DIAGNOSIS — Z6831 Body mass index (BMI) 31.0-31.9, adult: Secondary | ICD-10-CM | POA: Diagnosis not present

## 2016-07-03 DIAGNOSIS — I728 Aneurysm of other specified arteries: Secondary | ICD-10-CM | POA: Insufficient documentation

## 2016-07-03 DIAGNOSIS — I351 Nonrheumatic aortic (valve) insufficiency: Secondary | ICD-10-CM | POA: Diagnosis not present

## 2016-07-03 DIAGNOSIS — Z954 Presence of other heart-valve replacement: Secondary | ICD-10-CM | POA: Diagnosis not present

## 2016-07-03 DIAGNOSIS — I340 Nonrheumatic mitral (valve) insufficiency: Secondary | ICD-10-CM | POA: Insufficient documentation

## 2016-07-03 DIAGNOSIS — Z952 Presence of prosthetic heart valve: Secondary | ICD-10-CM | POA: Insufficient documentation

## 2016-07-03 DIAGNOSIS — E119 Type 2 diabetes mellitus without complications: Secondary | ICD-10-CM | POA: Insufficient documentation

## 2016-07-03 DIAGNOSIS — I071 Rheumatic tricuspid insufficiency: Secondary | ICD-10-CM | POA: Diagnosis not present

## 2016-07-03 DIAGNOSIS — I358 Other nonrheumatic aortic valve disorders: Secondary | ICD-10-CM | POA: Diagnosis not present

## 2016-07-03 DIAGNOSIS — I059 Rheumatic mitral valve disease, unspecified: Secondary | ICD-10-CM | POA: Diagnosis present

## 2016-07-15 MED ORDER — IOPAMIDOL (ISOVUE-370) INJECTION 76%
INTRAVENOUS | Status: AC
Start: 2016-07-15 — End: 2016-07-15
  Filled 2016-07-15: qty 50

## 2016-07-15 MED ORDER — LIDOCAINE HCL (PF) 1 % IJ SOLN
INTRAMUSCULAR | Status: AC
  Filled 2016-07-15: qty 60

## 2016-07-15 MED ORDER — HEPARIN (PORCINE) IN NACL 2-0.9 UNIT/ML-% IJ SOLN
INTRAMUSCULAR | Status: AC
  Filled 2016-07-15: qty 500

## 2016-07-19 ENCOUNTER — Other Ambulatory Visit: Payer: Self-pay | Admitting: Internal Medicine

## 2016-08-07 ENCOUNTER — Ambulatory Visit (INDEPENDENT_AMBULATORY_CARE_PROVIDER_SITE_OTHER): Payer: Commercial Managed Care - HMO | Admitting: Internal Medicine

## 2016-08-07 ENCOUNTER — Ambulatory Visit (INDEPENDENT_AMBULATORY_CARE_PROVIDER_SITE_OTHER): Payer: Commercial Managed Care - HMO | Admitting: General Practice

## 2016-08-07 ENCOUNTER — Encounter: Payer: Self-pay | Admitting: Internal Medicine

## 2016-08-07 ENCOUNTER — Other Ambulatory Visit (INDEPENDENT_AMBULATORY_CARE_PROVIDER_SITE_OTHER): Payer: Commercial Managed Care - HMO

## 2016-08-07 VITALS — BP 114/80 | Temp 97.8°F | Ht 62.0 in | Wt 172.0 lb

## 2016-08-07 DIAGNOSIS — E119 Type 2 diabetes mellitus without complications: Secondary | ICD-10-CM

## 2016-08-07 DIAGNOSIS — E559 Vitamin D deficiency, unspecified: Secondary | ICD-10-CM | POA: Diagnosis not present

## 2016-08-07 DIAGNOSIS — Z9889 Other specified postprocedural states: Secondary | ICD-10-CM

## 2016-08-07 DIAGNOSIS — I10 Essential (primary) hypertension: Secondary | ICD-10-CM | POA: Diagnosis not present

## 2016-08-07 DIAGNOSIS — R202 Paresthesia of skin: Secondary | ICD-10-CM

## 2016-08-07 DIAGNOSIS — Z23 Encounter for immunization: Secondary | ICD-10-CM

## 2016-08-07 DIAGNOSIS — R413 Other amnesia: Secondary | ICD-10-CM

## 2016-08-07 DIAGNOSIS — Z5181 Encounter for therapeutic drug level monitoring: Secondary | ICD-10-CM

## 2016-08-07 DIAGNOSIS — I4891 Unspecified atrial fibrillation: Secondary | ICD-10-CM | POA: Diagnosis not present

## 2016-08-07 DIAGNOSIS — Z7901 Long term (current) use of anticoagulants: Secondary | ICD-10-CM | POA: Diagnosis not present

## 2016-08-07 LAB — BASIC METABOLIC PANEL
BUN: 18 mg/dL (ref 6–23)
CHLORIDE: 104 meq/L (ref 96–112)
CO2: 31 meq/L (ref 19–32)
CREATININE: 0.98 mg/dL (ref 0.40–1.20)
Calcium: 9.7 mg/dL (ref 8.4–10.5)
GFR: 70.77 mL/min (ref >60.00)
Glucose, Bld: 110 mg/dL — ABNORMAL HIGH (ref 70–99)
Potassium: 4.4 mEq/L (ref 3.5–5.1)
Sodium: 141 mEq/L (ref 135–145)

## 2016-08-07 LAB — POCT INR: INR: 2.4

## 2016-08-07 LAB — HEMOGLOBIN A1C: Hgb A1c MFr Bld: 5.9 % (ref 4.6–6.5)

## 2016-08-07 LAB — VITAMIN D 25 HYDROXY (VIT D DEFICIENCY, FRACTURES): VITD: 40.42 ng/mL (ref 30.00–100.00)

## 2016-08-07 NOTE — Progress Notes (Signed)
Pre visit review using our clinic review tool, if applicable. No additional management support is needed unless otherwise documented below in the visit note. 

## 2016-08-07 NOTE — Progress Notes (Signed)
Subjective:  Patient ID: Beth Miller, female    DOB: [birth year]  Age: 75 y.o. MRN: [id number]  CC: No chief complaint on file.   HPI Beth Miller presents for DM, HTN, anticoagulation f/u  Outpatient Medications Prior to Visit  Medication Sig Dispense Refill  . acetaminophen (TYLENOL) 500 MG tablet Take 1,000 mg by mouth every 6 (six) hours as needed for mild pain.    . Alcohol Swabs (B-D SINGLE USE SWABS REGULAR) PADS 1 each by Does not apply route 2 (two) times daily. 100 each 5  . amoxicillin (AMOXIL) 500 MG tablet Take four tablets one hour prior to dental procedure 12 tablet 1  . Blood Glucose Calibration (ACCU-CHEK AVIVA) SOLN 1 each by In Vitro route as directed. 1 each 2  . Blood Glucose Monitoring Suppl (ACCU-CHEK AVIVA PLUS) w/Device KIT 1 each by Does not apply route 2 (two) times daily. 1 kit 0  . Cholecalciferol (VITAMIN D3) 1000 UNIT tablet Take 1,000 Units by mouth daily.      . digoxin (LANOXIN) 0.25 MG tablet Take 1 tablet (0.25 mg total) by mouth at bedtime. 7 tablet 0  . diltiazem (CARDIZEM CD) 120 MG 24 hr capsule Take 1 capsule (120 mg total) by mouth daily. 90 capsule 3  . donepezil (ARICEPT) 5 MG tablet TAKE 1 TABLET AT BEDTIME 90 tablet 3  . furosemide (LASIX) 40 MG tablet Take 1 tablet (40 mg total) by mouth at bedtime. 90 tablet 3  . glucosamine-chondroitin 500-400 MG tablet Take 1 tablet by mouth daily as needed (joint pain).     Marland Kitchen glucose blood (ACCU-CHEK AVIVA) test strip 1 each by Other route 2 (two) times daily. Use as instructed 100 each 5  . HYDROcodone-acetaminophen (NORCO/VICODIN) 5-325 MG tablet Take 0.5-1 tablets by mouth 2 (two) times daily as needed for severe pain. 60 tablet 0  . Lancet Devices (ACCU-CHEK SOFTCLIX) lancets 1 each by Other route 2 (two) times daily. Use as instructed 1 each 5  . loratadine (CLARITIN) 10 MG tablet Take 10 mg by mouth daily as needed for allergies.     Marland Kitchen OVER THE COUNTER MEDICATION Take 1 tablet by mouth as  needed (joint pain).     . potassium chloride SA (K-DUR,KLOR-CON) 20 MEQ tablet Take 1 tablet (20 mEq total) by mouth daily. 90 tablet 3  . vitamin B-12 (CYANOCOBALAMIN) 1000 MCG tablet Take 1,000 mcg by mouth daily as needed (energy).    . warfarin (COUMADIN) 10 MG tablet Take 1 tablet (10 mg total) by mouth daily. 90 tablet 3  . warfarin (COUMADIN) 5 MG tablet Take 1 tablet (5 mg total) by mouth daily. 90 tablet 3   No facility-administered medications prior to visit.     ROS Review of Systems  Constitutional: Negative for activity change, appetite change, chills, fatigue and unexpected weight change.  HENT: Negative for congestion, mouth sores and sinus pressure.   Eyes: Negative for visual disturbance.  Respiratory: Negative for cough and chest tightness.   Gastrointestinal: Negative for abdominal pain and nausea.  Genitourinary: Negative for difficulty urinating, frequency and vaginal pain.  Musculoskeletal: Negative for back pain and gait problem.  Skin: Negative for pallor and rash.  Neurological: Negative for dizziness, tremors, weakness, numbness and headaches.  Psychiatric/Behavioral: Negative for confusion and sleep disturbance. The patient is not nervous/anxious.     Objective:  There were no vitals taken for this visit.  BP Readings from Last 3 Encounters:  06/04/16 121/72  05/08/16 Marland Kitchen)  100/56  02/26/16 114/60    Wt Readings from Last 3 Encounters:  06/04/16 172 lb (78 kg)  05/08/16 170 lb (77.1 kg)  02/26/16 175 lb 12 oz (79.7 kg)    Physical Exam  Constitutional: She appears well-developed. No distress.  HENT:  Head: Normocephalic.  Right Ear: External ear normal.  Left Ear: External ear normal.  Nose: Nose normal.  Mouth/Throat: Oropharynx is clear and moist.  Eyes: Conjunctivae are normal. Pupils are equal, round, and reactive to light. Right eye exhibits no discharge. Left eye exhibits no discharge.  Neck: Normal range of motion. Neck supple. No JVD  present. No tracheal deviation present. No thyromegaly present.  Cardiovascular: Normal rate.   Pulmonary/Chest: No stridor. No respiratory distress. She has no wheezes.  Abdominal: Soft. Bowel sounds are normal. She exhibits no distension and no mass. There is no tenderness. There is no rebound and no guarding.  Musculoskeletal: She exhibits no edema or tenderness.  Lymphadenopathy:    She has no cervical adenopathy.  Neurological: She displays normal reflexes. No cranial nerve deficit. She exhibits normal muscle tone. Coordination normal.  Skin: No rash noted. No erythema.  Psychiatric: She has a normal mood and affect. Her behavior is normal. Judgment and thought content normal.  heart click; irreg irreg  Lab Results  Component Value Date   WBC 6.1 05/08/2016   HGB 12.4 05/08/2016   HCT 37.1 05/08/2016   PLT 287.0 05/08/2016   GLUCOSE 119 (H) 05/08/2016   CHOL 105 04/12/2015   TRIG 105.0 04/12/2015   HDL 38.50 (L) 04/12/2015   LDLCALC 46 04/12/2015   ALT 11 05/08/2016   AST 18 05/08/2016   NA 141 05/08/2016   K 4.0 05/08/2016   CL 104 05/08/2016   CREATININE 0.89 05/08/2016   BUN 11 05/08/2016   CO2 31 05/08/2016   TSH 2.37 04/12/2015   INR 2.0 05/08/2016   HGBA1C 6.3 02/26/2016    Mm Screening Breast Tomo Bilateral  Result Date: 09/01/2015 CLINICAL DATA:  Screening. EXAM: DIGITAL SCREENING BILATERAL MAMMOGRAM WITH 3D TOMO WITH CAD COMPARISON:  Previous exam(s). ACR Breast Density Category b: There are scattered areas of fibroglandular density. FINDINGS: There are no findings suspicious for malignancy. Images were processed with CAD. IMPRESSION: No mammographic evidence of malignancy. A result letter of this screening mammogram will be mailed directly to the patient. RECOMMENDATION: Screening mammogram in one year. (Code:SM-B-01Y) BI-RADS CATEGORY  1: Negative. Electronically Signed   By: Skipper Cliche M.D.   On: 09/01/2015 13:39    Assessment & Plan:   There are no  diagnoses linked to this encounter. I am having Ms. Golightly maintain her glucosamine-chondroitin, loratadine, cholecalciferol, acetaminophen, vitamin B-12, B-D SINGLE USE SWABS REGULAR, ACCU-CHEK AVIVA, ACCU-CHEK AVIVA PLUS, glucose blood, accu-chek softclix, potassium chloride SA, warfarin, warfarin, furosemide, amoxicillin, HYDROcodone-acetaminophen, OVER THE COUNTER MEDICATION, digoxin, diltiazem, and donepezil.  No orders of the defined types were placed in this encounter.    Follow-up: No Follow-up on file.  Walker Kehr, MD

## 2016-08-07 NOTE — Progress Notes (Signed)
I have reviewed and agree with the plan. 

## 2016-08-07 NOTE — Assessment & Plan Note (Signed)
Crisp click

## 2016-08-07 NOTE — Addendum Note (Signed)
Addended by: Elta Guadeloupe on: 08/07/2016 09:20 AM   Modules accepted: Orders

## 2016-08-07 NOTE — Assessment & Plan Note (Signed)
On Furosemide, [place] 

## 2016-08-07 NOTE — Assessment & Plan Note (Signed)
Labs

## 2016-08-07 NOTE — Assessment & Plan Note (Signed)
On Coumadin 

## 2016-08-07 NOTE — Assessment & Plan Note (Signed)
On Vit D 

## 2016-09-04 ENCOUNTER — Ambulatory Visit (INDEPENDENT_AMBULATORY_CARE_PROVIDER_SITE_OTHER): Payer: Commercial Managed Care - HMO | Admitting: General Practice

## 2016-09-04 DIAGNOSIS — I4891 Unspecified atrial fibrillation: Secondary | ICD-10-CM

## 2016-09-04 DIAGNOSIS — Z5181 Encounter for therapeutic drug level monitoring: Secondary | ICD-10-CM | POA: Diagnosis not present

## 2016-09-04 DIAGNOSIS — Z9889 Other specified postprocedural states: Secondary | ICD-10-CM

## 2016-09-04 LAB — POCT INR: INR: 1.8

## 2016-09-04 NOTE — Progress Notes (Signed)
I have reviewed and agree with the plan. 

## 2016-09-04 NOTE — Patient Instructions (Signed)
Pre visit review using our clinic review tool, if applicable. No additional management support is needed unless otherwise documented below in the visit note. 

## 2016-09-25 ENCOUNTER — Ambulatory Visit (INDEPENDENT_AMBULATORY_CARE_PROVIDER_SITE_OTHER): Payer: Commercial Managed Care - HMO | Admitting: General Practice

## 2016-09-25 DIAGNOSIS — Z9889 Other specified postprocedural states: Secondary | ICD-10-CM

## 2016-09-25 DIAGNOSIS — Z5181 Encounter for therapeutic drug level monitoring: Secondary | ICD-10-CM | POA: Diagnosis not present

## 2016-09-25 LAB — POCT INR: INR: 2.7

## 2016-09-25 NOTE — Patient Instructions (Signed)
Pre visit review using our clinic review tool, if applicable. No additional management support is needed unless otherwise documented below in the visit note. 

## 2016-09-26 NOTE — Progress Notes (Signed)
I have reviewed and agree with the plan. 

## 2016-10-24 ENCOUNTER — Encounter: Payer: Self-pay | Admitting: Nurse Practitioner

## 2016-10-24 ENCOUNTER — Ambulatory Visit (INDEPENDENT_AMBULATORY_CARE_PROVIDER_SITE_OTHER): Payer: Commercial Managed Care - HMO | Admitting: Nurse Practitioner

## 2016-10-24 ENCOUNTER — Ambulatory Visit: Payer: Commercial Managed Care - HMO

## 2016-10-24 ENCOUNTER — Telehealth: Payer: Self-pay

## 2016-10-24 ENCOUNTER — Other Ambulatory Visit (INDEPENDENT_AMBULATORY_CARE_PROVIDER_SITE_OTHER): Payer: Commercial Managed Care - HMO

## 2016-10-24 VITALS — BP 112/78 | HR 75 | Temp 98.0°F | Wt 182.0 lb

## 2016-10-24 DIAGNOSIS — H9193 Unspecified hearing loss, bilateral: Secondary | ICD-10-CM

## 2016-10-24 DIAGNOSIS — H8113 Benign paroxysmal vertigo, bilateral: Secondary | ICD-10-CM | POA: Diagnosis not present

## 2016-10-24 DIAGNOSIS — H9313 Tinnitus, bilateral: Secondary | ICD-10-CM

## 2016-10-24 DIAGNOSIS — Z79899 Other long term (current) drug therapy: Secondary | ICD-10-CM | POA: Diagnosis not present

## 2016-10-24 LAB — DIGOXIN LEVEL: DIGOXIN LVL: 1.1 ug/L (ref 0.8–2.0)

## 2016-10-24 LAB — BASIC METABOLIC PANEL
BUN: 15 mg/dL (ref 6–23)
CHLORIDE: 106 meq/L (ref 96–112)
CO2: 30 meq/L (ref 19–32)
Calcium: 9.2 mg/dL (ref 8.4–10.5)
Creatinine, Ser: 0.91 mg/dL (ref 0.40–1.20)
GFR: 77.05 mL/min (ref >60.00)
Glucose, Bld: 110 mg/dL — ABNORMAL HIGH (ref 70–99)
POTASSIUM: 4.4 meq/L (ref 3.5–5.1)
Sodium: 140 mEq/L (ref 135–145)

## 2016-10-24 MED ORDER — ONDANSETRON HCL 4 MG PO TABS: 4.0000 mg | ORAL_TABLET | Freq: Three times a day (TID) | ORAL | 0 refills | Status: DC | PRN

## 2016-10-24 MED ORDER — MECLIZINE HCL 25 MG PO TABS: 25.0000 mg | ORAL_TABLET | Freq: Three times a day (TID) | ORAL | 0 refills | Status: DC | PRN

## 2016-10-24 NOTE — Patient Instructions (Signed)
Meniere Disease Meniere disease is an inner ear disorder. It causes attacks of a spinning sensation (vertigo) and ringing in the ear (tinnitus). It also causes hearing loss and a sensation of fullness or pressure in your ear.  Meniere disease is lifelong. It may get worse over time. Symptoms usually begin in one ear but may eventually affect both ears.  CAUSES Meniere disease is caused by having too much of the fluid that is in your inner ear (endolymph). When endolymph builds up in your inner ear, it affects the nerves that control balance and hearing. The reason for the endolymph buildup is not known. Possible causes include:  Allergy.  An abnormal reaction of the body's defense system (autoimmune disease).  Viral infection of the inner ear.  Head injury. RISK FACTORS  Age older than 40 years.  Family history of Meniere disease.  History of autoimmune disease.  History of migraine headaches. SIGNS AND SYMPTOMS Symptoms of Meniere disease can come and go and may last for up to 4 hours at a time. Symptoms usually start in one ear and may become more frequent and eventually involve both ears. Symptoms can include:  Fullness and pressure in your ear.  Roaring or ringing in your ear.  Vertigo and loss of balance.  Decreased hearing.  Nausea and vomiting. DIAGNOSIS Your health care provider will perform a physical exam. Tests may be done to confirm a diagnosis of Meniere disease. These tests may include:  A hearing test (audiogram).  An electronystagmogram. This tests your balance nerve (vestibular nerve).  Imaging studies, such as CT or MRI, of your inner ear. TREATMENT There is no cure for Meniere disease, but it can be managed. Management may include:  A diet that may help relieve symptoms of Meniere disease.  Use of medicines to reduce:  Vertigo.  Nausea.  Fluid retention.  Use of an air pressure pulse generator. This is a machine that sends small pressure  pulses into your ear canal.  Inner ear surgery (rare). When you experience symptoms, it can be helpful to lie down on a flat surface and focus your eyes on one object that does not move. Try to stay in that position until your symptoms go away.  HOME CARE INSTRUCTIONS   Take medicines only as directed by your health care provider.  Eat the same amount of food at the same time every day, including snacks.  Do not skip meals.  Limit the salt in your diet to 1,000 mg a day.  Avoid caffeine.  Limit alcoholic drinks to one drink a day.  Do not eat foods containing monosodium glutamate (MSG).  Drink enough fluids to keep your urine clear or pale yellow.  Do not use any tobacco products including cigarettes, chewing tobacco, or electronic cigarettes. If you need help quitting, ask your health care provider.  Find ways to reduce or avoid stress. SEEK MEDICAL CARE IF:   You have symptoms that last longer than 4 hours.  You have new or more severe symptoms. SEEK IMMEDIATE MEDICAL CARE IF:   You have been vomiting for 24 hours.  You are not able to keep fluids down.  You have chest pain or trouble breathing. This information is not intended to replace advice given to you by your health care provider. Make sure you discuss any questions you have with your health care provider. Document Released: 10/11/2000 Document Revised: 11/04/2014 Document Reviewed: 09/27/2013 Elsevier Interactive Patient Education  2017 [employer].

## 2016-10-24 NOTE — Telephone Encounter (Signed)
Recd call from [facility] lab on stat order----digoxin lab value is 1.1----routing to Wilfred Lacy, NP

## 2016-10-24 NOTE — Progress Notes (Signed)
Subjective:  Patient ID: Beth Miller, female    DOB: 1940-12-27  Age: 75 y.o. MRN: [id number]  CC: Dizziness (dizzy for 2 wks ago , unstady gait. nausea,slight earache, "ear pop"come and goes,worse at night)  Dizziness  This is a new problem. The current episode started 1 to 4 weeks ago. The problem occurs intermittently. The problem has been waxing and waning. Associated symptoms include nausea and vertigo. Pertinent negatives include no abdominal pain, anorexia, chest pain, chills, congestion, coughing, diaphoresis, fever, headaches, neck pain, numbness, rash, sore throat, swollen glands, urinary symptoms, visual change, vomiting or weakness. Exacerbated by: laying down. She has tried rest for the symptoms. The treatment provided no relief.    Outpatient Medications Prior to Visit  Medication Sig Dispense Refill  . acetaminophen (TYLENOL) 500 MG tablet Take 1,000 mg by mouth every 6 (six) hours as needed for mild pain.    . Alcohol Swabs (B-D SINGLE USE SWABS REGULAR) PADS 1 each by Does not apply route 2 (two) times daily. 100 each 5  . amoxicillin (AMOXIL) 500 MG tablet Take four tablets one hour prior to dental procedure 12 tablet 1  . Blood Glucose Calibration (ACCU-CHEK AVIVA) SOLN 1 each by In Vitro route as directed. 1 each 2  . Blood Glucose Monitoring Suppl (ACCU-CHEK AVIVA PLUS) w/Device KIT 1 each by Does not apply route 2 (two) times daily. 1 kit 0  . Cholecalciferol (VITAMIN D3) 1000 UNIT tablet Take 1,000 Units by mouth daily.      . digoxin (LANOXIN) 0.25 MG tablet Take 1 tablet (0.25 mg total) by mouth at bedtime. 7 tablet 0  . diltiazem (CARDIZEM CD) 120 MG 24 hr capsule Take 1 capsule (120 mg total) by mouth daily. 90 capsule 3  . donepezil (ARICEPT) 5 MG tablet TAKE 1 TABLET AT BEDTIME 90 tablet 3  . furosemide (LASIX) 40 MG tablet Take 1 tablet (40 mg total) by mouth at bedtime. 90 tablet 3  . glucose blood (ACCU-CHEK AVIVA) test strip 1 each by Other route 2 (two)  times daily. Use as instructed 100 each 5  . HYDROcodone-acetaminophen (NORCO/VICODIN) 5-325 MG tablet Take 0.5-1 tablets by mouth 2 (two) times daily as needed for severe pain. 60 tablet 0  . Lancet Devices (ACCU-CHEK SOFTCLIX) lancets 1 each by Other route 2 (two) times daily. Use as instructed 1 each 5  . loratadine (CLARITIN) 10 MG tablet Take 10 mg by mouth daily as needed for allergies.     Marland Kitchen OVER THE COUNTER MEDICATION Take 1 tablet by mouth as needed (joint pain).     . potassium chloride SA (K-DUR,KLOR-CON) 20 MEQ tablet Take 1 tablet (20 mEq total) by mouth daily. 90 tablet 3  . vitamin B-12 (CYANOCOBALAMIN) 1000 MCG tablet Take 1,000 mcg by mouth daily as needed (energy).    . warfarin (COUMADIN) 10 MG tablet Take 1 tablet (10 mg total) by mouth daily. 90 tablet 3  . warfarin (COUMADIN) 5 MG tablet Take 1 tablet (5 mg total) by mouth daily. 90 tablet 3  . vitamin B-12 (CYANOCOBALAMIN) 100 MCG tablet      No facility-administered medications prior to visit.     ROS See HPI  Objective:  BP 112/78   Pulse 75   Temp 98 F (36.7 C)   Wt 182 lb (82.6 kg)   SpO2 98%   BMI 33.29 kg/m   BP Readings from Last 3 Encounters:  10/24/16 112/78  08/07/16 114/80  06/04/16 121/72  Wt Readings from Last 3 Encounters:  10/24/16 182 lb (82.6 kg)  08/07/16 172 lb (78 kg)  06/04/16 172 lb (78 kg)    Physical Exam  Constitutional: She is oriented to person, place, and time. No distress.  HENT:  Right Ear: Tympanic membrane, external ear and ear canal normal. No mastoid tenderness. No middle ear effusion.  Left Ear: Tympanic membrane, external ear and ear canal normal. No mastoid tenderness.  No middle ear effusion.  Nose: Nose normal. No mucosal edema or rhinorrhea. Right sinus exhibits no maxillary sinus tenderness and no frontal sinus tenderness. Left sinus exhibits no maxillary sinus tenderness and no frontal sinus tenderness.  Mouth/Throat: Oropharynx is clear and moist. No  oropharyngeal exudate.  Eyes: Conjunctivae and EOM are normal. Pupils are equal, round, and reactive to light. No scleral icterus.  Neck: Normal range of motion. Neck supple.  Cardiovascular: Normal rate, regular rhythm and normal heart sounds.   Pulmonary/Chest: Effort normal and breath sounds normal.  Musculoskeletal: She exhibits no edema.  Normal gait  Lymphadenopathy:    She has no cervical adenopathy.  Neurological: She is alert and oriented to person, place, and time. No cranial nerve deficit. Coordination normal.  Vitals reviewed.   Lab Results  Component Value Date   WBC 6.1 05/08/2016   HGB 12.4 05/08/2016   HCT 37.1 05/08/2016   PLT 287.0 05/08/2016   GLUCOSE 110 (H) 10/24/2016   CHOL 105 04/12/2015   TRIG 105.0 04/12/2015   HDL 38.50 (L) 04/12/2015   LDLCALC 46 04/12/2015   ALT 11 05/08/2016   AST 18 05/08/2016   NA 140 10/24/2016   K 4.4 10/24/2016   CL 106 10/24/2016   CREATININE 0.91 10/24/2016   BUN 15 10/24/2016   CO2 30 10/24/2016   TSH 2.37 04/12/2015   INR 2.7 09/25/2016   HGBA1C 5.9 08/07/2016    Mm Screening Breast Tomo Bilateral  Result Date: 09/01/2015 CLINICAL DATA:  Screening. EXAM: DIGITAL SCREENING BILATERAL MAMMOGRAM WITH 3D TOMO WITH CAD COMPARISON:  Previous exam(s). ACR Breast Density Category b: There are scattered areas of fibroglandular density. FINDINGS: There are no findings suspicious for malignancy. Images were processed with CAD. IMPRESSION: No mammographic evidence of malignancy. A result letter of this screening mammogram will be mailed directly to the patient. RECOMMENDATION: Screening mammogram in one year. (Code:SM-B-01Y) BI-RADS CATEGORY  1: Negative. Electronically Signed   By: Skipper Cliche M.D.   On: 09/01/2015 13:39    Assessment & Plan:   Beth Miller was seen today for dizziness.  Diagnoses and all orders for this visit:  Vertigo, benign paroxysmal, bilateral -     Digoxin level; Future -     meclizine (ANTIVERT) 25 MG  tablet; Take 1 tablet (25 mg total) by mouth 3 (three) times daily as needed for dizziness. -     ondansetron (ZOFRAN) 4 MG tablet; Take 1 tablet (4 mg total) by mouth every 8 (eight) hours as needed for nausea or vomiting. -     Basic Metabolic Panel (BMET); Future -     Ambulatory referral to Audiology  Bilateral hearing loss, unspecified hearing loss type -     Digoxin level; Future -     Ambulatory referral to Audiology  Tinnitus of both ears -     Digoxin level; Future -     meclizine (ANTIVERT) 25 MG tablet; Take 1 tablet (25 mg total) by mouth 3 (three) times daily as needed for dizziness. -     ondansetron (ZOFRAN)  4 MG tablet; Take 1 tablet (4 mg total) by mouth every 8 (eight) hours as needed for nausea or vomiting. -     Basic Metabolic Panel (BMET); Future -     Ambulatory referral to Audiology   I am having Beth Miller start on meclizine and ondansetron. I am also having her maintain her loratadine, cholecalciferol, acetaminophen, vitamin B-12, B-D SINGLE USE SWABS REGULAR, ACCU-CHEK AVIVA, ACCU-CHEK AVIVA PLUS, glucose blood, accu-chek softclix, potassium chloride SA, warfarin, warfarin, furosemide, amoxicillin, HYDROcodone-acetaminophen, OVER THE COUNTER MEDICATION, digoxin, diltiazem, donepezil, and vitamin B-12.  Meds ordered this encounter  Medications  . meclizine (ANTIVERT) 25 MG tablet    Sig: Take 1 tablet (25 mg total) by mouth 3 (three) times daily as needed for dizziness.    Dispense:  21 tablet    Refill:  0    Order Specific Question:   Supervising Provider    Answer:   Cassandria Anger [[id number]]  . ondansetron (ZOFRAN) 4 MG tablet    Sig: Take 1 tablet (4 mg total) by mouth every 8 (eight) hours as needed for nausea or vomiting.    Dispense:  20 tablet    Refill:  0    Order Specific Question:   Supervising Provider    Answer:   Cassandria Anger [[id number]]    Follow-up: Return if symptoms worsen or fail to improve.  Wilfred Lacy, NP

## 2016-10-24 NOTE — Progress Notes (Signed)
Pre visit review using our clinic review tool, if applicable. No additional management support is needed unless otherwise documented below in the visit note. 

## 2016-10-24 NOTE — Progress Notes (Signed)
Normal results, see office note

## 2016-10-30 ENCOUNTER — Ambulatory Visit: Payer: Commercial Managed Care - HMO

## 2016-11-04 ENCOUNTER — Ambulatory Visit: Payer: Commercial Managed Care - HMO | Attending: Nurse Practitioner | Admitting: Audiology

## 2016-11-04 DIAGNOSIS — R42 Dizziness and giddiness: Secondary | ICD-10-CM | POA: Diagnosis not present

## 2016-11-04 DIAGNOSIS — R292 Abnormal reflex: Secondary | ICD-10-CM | POA: Diagnosis not present

## 2016-11-04 DIAGNOSIS — H93299 Other abnormal auditory perceptions, unspecified ear: Secondary | ICD-10-CM | POA: Insufficient documentation

## 2016-11-04 DIAGNOSIS — H918X2 Other specified hearing loss, left ear: Secondary | ICD-10-CM | POA: Insufficient documentation

## 2016-11-04 DIAGNOSIS — H90A22 Sensorineural hearing loss, unilateral, left ear, with restricted hearing on the contralateral side: Secondary | ICD-10-CM | POA: Diagnosis not present

## 2016-11-04 DIAGNOSIS — IMO0001 Reserved for inherently not codable concepts without codable children: Secondary | ICD-10-CM

## 2016-11-04 DIAGNOSIS — H9313 Tinnitus, bilateral: Secondary | ICD-10-CM | POA: Diagnosis not present

## 2016-11-04 NOTE — Procedures (Signed)
[facility]  [place], [place] [address]  [id number]   Audiological Evaluation  Patient Name: Beth Miller   Status: Outpatient   DOB: 06-14-1941    Diagnosis: Hearing Loss, vertigo, tinnitus        MRN: [id number] Date:  11/04/2016     Referent: Walker Kehr, MD  History: Beth Miller was seen for an audiological evaluation. Accompanied by: Herself Primary Concern: Recent episode of vertigo, about 3 weeks ago.  The "meclizine" helped the vertigo, but it caused constipation so she quit taking the meclizine.  History of hearing problems: Y - tinnitus started in  20's or early 30's, with hearing loss developing in the last 20 years.  Now needs the TV turned up. History of ear infections:   N History of dizziness/vertigo:   Y - had for the first time about "3 weeks ago" and it "lasted about 3 weeks".  Got worse when first "laid down at night" and would "spin toward the right ear for a few seconds".  "Right ear popped, during the 2nd week, and then vertigo started getting better".  Hearing became poorer when having vertigo.  History of balance issues:  Y- during vertigo. Had to stay in bed during the 1st week. The second week I had to hold onto things to walk around in house. Tinnitus: Y - Have had tinnitus for 10+ years - was told that one of her heart medications may be causing "hearing loss" and the medication was stopped.  States tinnitus "runs in the family".  Sound sensitivity: N History of occupational noise exposure: N History of hypertension: Not sure because on numerous heart medications. History of diabetes:  Y - controlled with diet and exercise. Family history of hearing loss:  Mother and sister developed hearing loss when older. Other concerns: Had "rheumatic fever" as a child and  had mitral valve surgery in 1955. Medications:    Evaluation: Conventional pure tone audiometry from 250Hz  - 8000Hz  with using insert  earphones.  Hearing Thresholds: Right ear:  Thresholds of 25-35 dBHL from 250-Hz - 6000Hz  with a 40 dBHL threshold at 1000Hz  and 55 dBHL at 8000Hz . The hearing loss is sensorineural.  Left ear:    Thresholds of 50-60 dBHL from 250Hz  - 500Hz ; 65-70 dBHL from 750Hz  - 1000Hz ; 45 dbHL from 2000Hz  - 4000Hz  and 55 dBHL at 8000Hz . The hearing loss is primarily sensorineural with a mixed component at 4000Hz . Reliability is good Speech reception levels (repeating words near threshold) using recorded spondee word lists:  Right ear: 30 dBHL.  Left ear:  45 dBHL Word recognition (at comfortably loud volumes) using recorded NU-6 word lists, in quiet.  Right ear: 96% at 70dBHL with contralateral masking.  Left ear:   92% at 75 dBHL with contralateral masking Word recognition in minimal background noise:  +5 dBHL  Right ear: 68%                              Left ear:  68%  Tympanometry (middle ear function) with ipsilateral acoustic reflexes. Normal (Type A) with absent acoustic reflex at 1000Hz  bilaterally. Additional acoustic reflexes not completed - difficult to maintain seal.  CONCLUSION:      Beth Miller has poorer hearing thresholds on the left side, but she states that she has been aware of this since she was last evaluated by an ENT "20 years ago".  Beth Miller does  not know whether the left ear hearing loss is stable or is changing so close monitoring of her hearing is recommended and a repeat audiological evaluation has been scheduled here in 6 months.  Beth SHEPPERSON' s right ear has a mild to borderline moderate sensorineural hearing loss throughout most of the speech range dropping to a moderate hearing loss at 8000Hz  only. The left ear has a moderate to borderline severe low frequency sensorineural hearing loss improving to a mild to moderate mixed high frequency hearing loss. This amount of hearing loss would adversely affect speech communication at normal conversational speech levels - especially on  the left side.   Word recognition is excellent in quiet at loud conversational speech levels bilaterally. In minimal background noise, word recognition drops to poor in each ear.    Beth Miller reports constant high frequency tinnitus, but there was no vertigo during today's evaluation.  Beth Miller has already discovered the importance of masking the tinnitus and trying to not focus on it.    Beth Miller needs further evaluation by an ENT followed by a hearing aid/tinnitus evaluation.   RECOMMENDATIONS: 1.   Monitor hearing closely with a repeat audiological evaluation in 6 months (earlier if there is any change in hearing or ear pressure).  This appointment has been scheduled here May 14, 2017 at 8am  2.   Contact Walker Kehr, MD immediately if change in hearing or vertigo. 3.   Consider referral to an Ear, Nose and Throat physician because of a) recent vertigo - spinning to the right b) poorer hearing in the left ear c) constant high pitched tinnitus.  4.   To minimize the adverse effects of tinnitus 1) avoid quiet  2) use noise maskers at home such as a sound machine, quiet music, a fan or other background noise at a volume just loud enough to mask the high pitched tinnitus. 3) If the tinnitus becomes more bothersome, adversely affecting your sleep or concentration, contact your physician,  seek additional medical help by an ENT for further treatment of your tinnitus. 5.  Strategies that help improve hearing include: A) Face the speaker directly. Optimal is having the speakers face well - lit.  Unless amplified, being within 3-6 feet of the speaker will enhance word recognition. B) Avoid having the speaker back-lit as this will minimize the ability to use cues from lip-reading, facial expression and gestures. C)  Word recognition is poorer in background noise. For optimal word recognition, turn off the TV, radio or noisy fan when engaging in conversation. In a restaurant, try to sit away from noise  sources and close to the primary speaker.  D)  Ask for topic clarification from time to time in order to remain in the conversation.  Most people don't mind repeating or clarifying a point when asked.  If needed, explain the difficulty hearing in background noise or hearing loss. 6.  Use hearing protection during noisy activities such as using a weed eater, moving the lawn, shooting, etc.    Musician's plugs, are available from [employer] for music related hearing protection because there is no distortion.  Other hearing protection, such as sponge plugs (available at pharmacies) or earmuffs (available at sporting goods stores or department stores) are useful for noisy activities and venues. 7.   Consider a hearing aid evaluation and/or tinnitus masking.    Amplification helps make the signal louder and therefore often improves hearing and word recognition.  Amplification has many forms including hearing aids in  one or both ears, an assistive listening device which have a microphone and speaker such as a small handheld device and/or even a surround sound system of speakers.  Amplification may be covered by some insurances, but not all.  It is important to note that hearing aids must be individually fit according to the hearing test results and the ear shape.  Audiologists and hearing aid dealers in [place] must be licensed in order to dispense hearing aids.  In addition, a trial period is mandated by law in our state because often amplification must be tried and then evaluated in order to determine benefit.   Deborah L. Heide Spark, Au.D., CCC-A Doctor of Audiology  11/04/2016   cc: Walker Kehr, MD \

## 2016-11-04 NOTE — Patient Instructions (Signed)
Contact Walker Kehr, MD immediately if change in hearing or vertigo.  Recommendation: 1) monitor hearing closely with repeat hearing test in 6 months. 2) referral ENT  Deborah L. Heide Spark, Au.D., CCC-A Doctor of Audiology 11/04/2016

## 2016-11-06 ENCOUNTER — Ambulatory Visit: Payer: Commercial Managed Care - HMO

## 2016-11-07 ENCOUNTER — Other Ambulatory Visit (INDEPENDENT_AMBULATORY_CARE_PROVIDER_SITE_OTHER): Payer: Commercial Managed Care - HMO

## 2016-11-07 ENCOUNTER — Encounter: Payer: Self-pay | Admitting: Internal Medicine

## 2016-11-07 ENCOUNTER — Ambulatory Visit (INDEPENDENT_AMBULATORY_CARE_PROVIDER_SITE_OTHER): Payer: Commercial Managed Care - HMO | Admitting: Internal Medicine

## 2016-11-07 VITALS — BP 118/70 | HR 87 | Temp 98.6°F | Wt 176.0 lb

## 2016-11-07 DIAGNOSIS — J069 Acute upper respiratory infection, unspecified: Secondary | ICD-10-CM

## 2016-11-07 DIAGNOSIS — Z0000 Encounter for general adult medical examination without abnormal findings: Secondary | ICD-10-CM

## 2016-11-07 DIAGNOSIS — I482 Chronic atrial fibrillation, unspecified: Secondary | ICD-10-CM

## 2016-11-07 DIAGNOSIS — D638 Anemia in other chronic diseases classified elsewhere: Secondary | ICD-10-CM | POA: Diagnosis not present

## 2016-11-07 DIAGNOSIS — E119 Type 2 diabetes mellitus without complications: Secondary | ICD-10-CM | POA: Diagnosis not present

## 2016-11-07 DIAGNOSIS — J029 Acute pharyngitis, unspecified: Secondary | ICD-10-CM | POA: Diagnosis not present

## 2016-11-07 DIAGNOSIS — G459 Transient cerebral ischemic attack, unspecified: Secondary | ICD-10-CM

## 2016-11-07 DIAGNOSIS — E559 Vitamin D deficiency, unspecified: Secondary | ICD-10-CM

## 2016-11-07 LAB — LIPID PANEL
CHOLESTEROL: 113 mg/dL (ref 0–200)
HDL: 42.8 mg/dL (ref >39.00)
LDL CALC: 48 mg/dL (ref 0–99)
NONHDL: 70.58
Total CHOL/HDL Ratio: 3
Triglycerides: 111 mg/dL (ref 0.0–149.0)
VLDL: 22.2 mg/dL (ref 0.0–40.0)

## 2016-11-07 LAB — URINALYSIS, ROUTINE W REFLEX MICROSCOPIC
BILIRUBIN URINE: NEGATIVE
KETONES UR: NEGATIVE
LEUKOCYTES UA: NEGATIVE
Nitrite: NEGATIVE
PH: 6 (ref 5.0–8.0)
SPECIFIC GRAVITY, URINE: 1.01 (ref 1.000–1.030)
Total Protein, Urine: NEGATIVE
URINE GLUCOSE: NEGATIVE
UROBILINOGEN UA: 0.2 (ref 0.0–1.0)

## 2016-11-07 LAB — BASIC METABOLIC PANEL
BUN: 14 mg/dL (ref 6–23)
CALCIUM: 9.7 mg/dL (ref 8.4–10.5)
CO2: 33 meq/L — AB (ref 19–32)
CREATININE: 1.06 mg/dL (ref 0.40–1.20)
Chloride: 101 mEq/L (ref 96–112)
GFR: 64.6 mL/min (ref >60.00)
Glucose, Bld: 124 mg/dL — ABNORMAL HIGH (ref 70–99)
Potassium: 4.2 mEq/L (ref 3.5–5.1)
Sodium: 141 mEq/L (ref 135–145)

## 2016-11-07 LAB — CBC WITH DIFFERENTIAL/PLATELET
BASOS PCT: 1 % (ref 0.0–3.0)
Basophils Absolute: 0 10*3/uL (ref 0.0–0.1)
Eosinophils Absolute: 0 10*3/uL (ref 0.0–0.7)
Eosinophils Relative: 0.9 % (ref 0.0–5.0)
HCT: 39.4 % (ref 36.0–46.0)
Hemoglobin: 13.3 g/dL (ref 12.0–15.0)
LYMPHS ABS: 1.3 10*3/uL (ref 0.7–4.0)
Lymphocytes Relative: 32.3 % (ref 12.0–46.0)
MCHC: 33.7 g/dL (ref 30.0–36.0)
MCV: 83.2 fl (ref 78.0–100.0)
MONO ABS: 1.1 10*3/uL — AB (ref 0.1–1.0)
MONOS PCT: 25.7 % — AB (ref 3.0–12.0)
NEUTROS ABS: 1.7 10*3/uL (ref 1.4–7.7)
NEUTROS PCT: 40.1 % — AB (ref 43.0–77.0)
Platelets: 220 10*3/uL (ref 150.0–400.0)
RBC: 4.74 Mil/uL (ref 3.87–5.11)
RDW: 16 % — AB (ref 11.5–15.5)
WBC: 4.2 10*3/uL (ref 4.0–10.5)

## 2016-11-07 LAB — HEPATIC FUNCTION PANEL
ALT: 13 U/L (ref 0–35)
AST: 22 U/L (ref 0–37)
Albumin: 4 g/dL (ref 3.5–5.2)
Alkaline Phosphatase: 73 U/L (ref 39–117)
BILIRUBIN DIRECT: 0.1 mg/dL (ref 0.0–0.3)
BILIRUBIN TOTAL: 0.5 mg/dL (ref 0.2–1.2)
Total Protein: 7.5 g/dL (ref 6.0–8.3)

## 2016-11-07 LAB — TSH: TSH: 1.98 u[IU]/mL (ref 0.35–4.50)

## 2016-11-07 LAB — DIGOXIN LEVEL: Digoxin Level: 1.1 ug/L (ref 0.8–2.0)

## 2016-11-07 LAB — HEMOGLOBIN A1C: HEMOGLOBIN A1C: 6 % (ref 4.6–6.5)

## 2016-11-07 MED ORDER — DILTIAZEM HCL ER COATED BEADS 180 MG PO CP24: 180.0000 mg | ORAL_CAPSULE | Freq: Every day | ORAL | 3 refills | Status: DC

## 2016-11-07 NOTE — Assessment & Plan Note (Signed)
Vit D 

## 2016-11-07 NOTE — Progress Notes (Signed)
Pre visit review using our clinic review tool, if applicable. No additional management support is needed unless otherwise documented below in the visit note. 

## 2016-11-07 NOTE — Assessment & Plan Note (Signed)
Here for medicare wellness/physical  Diet: heart healthy  Physical activity: not sedentary  Depression/mood screen: negative  Hearing: intact to whispered voice  Visual acuity: grossly normal w/glasses, performs annual eye exam  ADLs: capable  Fall risk: low  Home safety: good  Cognitive evaluation: intact to orientation, naming, recall and repetition  EOL planning: adv directives, full code/ I agree  I have personally reviewed and have noted  1. The patient's medical, surgical and social history  2. Their use of alcohol, tobacco or illicit drugs  3. Their current medications and supplements  4. The patient's functional ability including ADL's, fall risks, home safety risks and hearing or visual impairment.  5. Diet and physical activities  6. Evidence for depression or mood disorders 7. The roster of all physicians providing medical care to patient - is listed in the Snapshot section of the chart and reviewed today.    Today patient counseled on age appropriate routine health concerns for screening and prevention, each reviewed and up to date or declined. Immunizations reviewed and up to date or declined. Labs ordered and reviewed. Risk factors for depression reviewed and negative. Hearing function and visual acuity are intact. ADLs screened and addressed as needed. Functional ability and level of safety reviewed and appropriate. Education, counseling and referrals performed based on assessed risks today. Patient provided with a copy of personalized plan for preventive services.   Colonoscopy 2013

## 2016-11-07 NOTE — Assessment & Plan Note (Signed)
Rate controlled Digoxin, Coumadin Increase Cardizem to 180 mg/d Labs

## 2016-11-07 NOTE — Assessment & Plan Note (Signed)
No relapse 

## 2016-11-07 NOTE — Assessment & Plan Note (Signed)
Labs

## 2016-11-07 NOTE — Patient Instructions (Addendum)
Use over-the-counter  "cold" medicines  such as "Afrin" nasal spray for nasal congestion as directed instead. Use" Delsym" or" Robitussin" cough syrup varietis for cough.  You can use plain "Tylenol" or "Advil" for fever, chills and achyness. Use Halls or Ricola cough drops.   "Common cold" symptoms are usually triggered by a virus.  The antibiotics are usually not necessary. On average, a" viral cold" illness would take 4-7 days to resolve. Please, make an appointment if you are not better or if you're worse.    Health Maintenance for Postmenopausal Women Introduction Menopause is a normal process in which your reproductive ability comes to an end. This process happens gradually over a span of months to years, usually between the ages of 74 and 86. Menopause is complete when you have missed 12 consecutive menstrual periods. It is important to talk with your health care provider about some of the most common conditions that affect postmenopausal women, such as heart disease, cancer, and bone loss (osteoporosis). Adopting a healthy lifestyle and getting preventive care can help to promote your health and wellness. Those actions can also lower your chances of developing some of these common conditions. What should I know about menopause? During menopause, you may experience a number of symptoms, such as:  Moderate-to-severe hot flashes.  Night sweats.  Decrease in sex drive.  Mood swings.  Headaches.  Tiredness.  Irritability.  Memory problems.  Insomnia. Choosing to treat or not to treat menopausal changes is an individual decision that you make with your health care provider. What should I know about hormone replacement therapy and supplements? Hormone therapy products are effective for treating symptoms that are associated with menopause, such as hot flashes and night sweats. Hormone replacement carries certain risks, especially as you become older. If you are thinking about using  estrogen or estrogen with progestin treatments, discuss the benefits and risks with your health care provider. What should I know about heart disease and stroke? Heart disease, heart attack, and stroke become more likely as you age. This may be due, in part, to the hormonal changes that your body experiences during menopause. These can affect how your body processes dietary fats, triglycerides, and cholesterol. Heart attack and stroke are both medical emergencies. There are many things that you can do to help prevent heart disease and stroke:  Have your blood pressure checked at least every 1-2 years. High blood pressure causes heart disease and increases the risk of stroke.  If you are 35-7 years old, ask your health care provider if you should take aspirin to prevent a heart attack or a stroke.  Do not use any tobacco products, including cigarettes, chewing tobacco, or electronic cigarettes. If you need help quitting, ask your health care provider.  It is important to eat a healthy diet and maintain a healthy weight.  Be sure to include plenty of vegetables, fruits, low-fat dairy products, and lean protein.  Avoid eating foods that are high in solid fats, added sugars, or salt (sodium).  Get regular exercise. This is one of the most important things that you can do for your health.  Try to exercise for at least 150 minutes each week. The type of exercise that you do should increase your heart rate and make you sweat. This is known as moderate-intensity exercise.  Try to do strengthening exercises at least twice each week. Do these in addition to the moderate-intensity exercise.  Know your numbers.Ask your health care provider to check your cholesterol and  your blood glucose. Continue to have your blood tested as directed by your health care provider. What should I know about cancer screening? There are several types of cancer. Take the following steps to reduce your risk and to catch any  cancer development as early as possible. Breast Cancer  Practice breast self-awareness.  This means understanding how your breasts normally appear and feel.  It also means doing regular breast self-exams. Let your health care provider know about any changes, no matter how small.  If you are 71 or older, have a clinician do a breast exam (clinical breast exam or CBE) every year. Depending on your age, family history, and medical history, it may be recommended that you also have a yearly breast X-ray (mammogram).  If you have a family history of breast cancer, talk with your health care provider about genetic screening.  If you are at high risk for breast cancer, talk with your health care provider about having an MRI and a mammogram every year.  Breast cancer (BRCA) gene test is recommended for women who have family members with BRCA-related cancers. Results of the assessment will determine the need for genetic counseling and BRCA1 and for BRCA2 testing. BRCA-related cancers include these types:  Breast. This occurs in males or females.  Ovarian.  Tubal. This may also be called fallopian tube cancer.  Cancer of the abdominal or pelvic lining (peritoneal cancer).  Prostate.  Pancreatic. Cervical, Uterine, and Ovarian Cancer  Your health care provider may recommend that you be screened regularly for cancer of the pelvic organs. These include your ovaries, uterus, and vagina. This screening involves a pelvic exam, which includes checking for microscopic changes to the surface of your cervix (Pap test).  For women ages 21-65, health care providers may recommend a pelvic exam and a Pap test every three years. For women ages 22-65, they may recommend the Pap test and pelvic exam, combined with testing for human papilloma virus (HPV), every five years. Some types of HPV increase your risk of cervical cancer. Testing for HPV may also be done on women of any age who have unclear Pap test  results.  Other health care providers may not recommend any screening for nonpregnant women who are considered low risk for pelvic cancer and have no symptoms. Ask your health care provider if a screening pelvic exam is right for you.  If you have had past treatment for cervical cancer or a condition that could lead to cancer, you need Pap tests and screening for cancer for at least 20 years after your treatment. If Pap tests have been discontinued for you, your risk factors (such as having a new sexual partner) need to be reassessed to determine if you should start having screenings again. Some women have medical problems that increase the chance of getting cervical cancer. In these cases, your health care provider may recommend that you have screening and Pap tests more often.  If you have a family history of uterine cancer or ovarian cancer, talk with your health care provider about genetic screening.  If you have vaginal bleeding after reaching menopause, tell your health care provider.  There are currently no reliable tests available to screen for ovarian cancer. Lung Cancer  Lung cancer screening is recommended for adults 54-38 years old who are at high risk for lung cancer because of a history of smoking. A yearly low-dose CT scan of the lungs is recommended if you:  Currently smoke.  Have a history of  at least 30 pack-years of smoking and you currently smoke or have quit within the past 15 years. A pack-year is smoking an average of one pack of cigarettes per day for one year. Yearly screening should:  Continue until it has been 15 years since you quit.  Stop if you develop a health problem that would prevent you from having lung cancer treatment. Colorectal Cancer  This type of cancer can be detected and can often be prevented.  Routine colorectal cancer screening usually begins at age 63 and continues through age 64.  If you have risk factors for colon cancer, your health care  provider may recommend that you be screened at an earlier age.  If you have a family history of colorectal cancer, talk with your health care provider about genetic screening.  Your health care provider may also recommend using home test kits to check for hidden blood in your stool.  A small camera at the end of a tube can be used to examine your colon directly (sigmoidoscopy or colonoscopy). This is done to check for the earliest forms of colorectal cancer.  Direct examination of the colon should be repeated every 5-10 years until age 32. However, if early forms of precancerous polyps or small growths are found or if you have a family history or genetic risk for colorectal cancer, you may need to be screened more often. Skin Cancer  Check your skin from head to toe regularly.  Monitor any moles. Be sure to tell your health care provider:  About any new moles or changes in moles, especially if there is a change in a mole's shape or color.  If you have a mole that is larger than the size of a pencil eraser.  If any of your family members has a history of skin cancer, especially at a young age, talk with your health care provider about genetic screening.  Always use sunscreen. Apply sunscreen liberally and repeatedly throughout the day.  Whenever you are outside, protect yourself by wearing long sleeves, pants, a wide-brimmed hat, and sunglasses. What should I know about osteoporosis? Osteoporosis is a condition in which bone destruction happens more quickly than new bone creation. After menopause, you may be at an increased risk for osteoporosis. To help prevent osteoporosis or the bone fractures that can happen because of osteoporosis, the following is recommended:  If you are 59-64 years old, get at least 1,000 mg of calcium and at least 600 mg of vitamin D per day.  If you are older than age 53 but younger than age 15, get at least 1,200 mg of calcium and at least 600 mg of vitamin D  per day.  If you are older than age 9, get at least 1,200 mg of calcium and at least 800 mg of vitamin D per day. Smoking and excessive alcohol intake increase the risk of osteoporosis. Eat foods that are rich in calcium and vitamin D, and do weight-bearing exercises several times each week as directed by your health care provider. What should I know about how menopause affects my mental health? Depression may occur at any age, but it is more common as you become older. Common symptoms of depression include:  Low or sad mood.  Changes in sleep patterns.  Changes in appetite or eating patterns.  Feeling an overall lack of motivation or enjoyment of activities that you previously enjoyed.  Frequent crying spells. Talk with your health care provider if you think that you are experiencing  depression. What should I know about immunizations? It is important that you get and maintain your immunizations. These include:  Tetanus, diphtheria, and pertussis (Tdap) booster vaccine.  Influenza every year before the flu season begins.  Pneumonia vaccine.  Shingles vaccine. Your health care provider may also recommend other immunizations. This information is not intended to replace advice given to you by your health care provider. Make sure you discuss any questions you have with your health care provider. Document Released: 12/06/2005 Document Revised: 05/03/2016 Document Reviewed: 07/18/2015  2017 [employer]

## 2016-11-07 NOTE — Assessment & Plan Note (Signed)
OTC meds 

## 2016-11-07 NOTE — Progress Notes (Signed)
Subjective:  Patient ID: Beth Miller, female    DOB: 03-10-1941  Age: 76 y.o. MRN: [id number]  CC: No chief complaint on file.   HPI KAMMY KLETT presents for well exam C/o URI x 4 days C/o occ palpitations, "shaky inside" feeling  Outpatient Medications Prior to Visit  Medication Sig Dispense Refill  . acetaminophen (TYLENOL) 500 MG tablet Take 1,000 mg by mouth every 6 (six) hours as needed for mild pain.    . Alcohol Swabs (B-D SINGLE USE SWABS REGULAR) PADS 1 each by Does not apply route 2 (two) times daily. 100 each 5  . amoxicillin (AMOXIL) 500 MG tablet Take four tablets one hour prior to dental procedure 12 tablet 1  . Blood Glucose Calibration (ACCU-CHEK AVIVA) SOLN 1 each by In Vitro route as directed. 1 each 2  . Blood Glucose Monitoring Suppl (ACCU-CHEK AVIVA PLUS) w/Device KIT 1 each by Does not apply route 2 (two) times daily. 1 kit 0  . Cholecalciferol (VITAMIN D3) 1000 UNIT tablet Take 1,000 Units by mouth daily.      . digoxin (LANOXIN) 0.25 MG tablet Take 1 tablet (0.25 mg total) by mouth at bedtime. 7 tablet 0  . diltiazem (CARDIZEM CD) 120 MG 24 hr capsule Take 1 capsule (120 mg total) by mouth daily. 90 capsule 3  . donepezil (ARICEPT) 5 MG tablet TAKE 1 TABLET AT BEDTIME 90 tablet 3  . furosemide (LASIX) 40 MG tablet Take 1 tablet (40 mg total) by mouth at bedtime. 90 tablet 3  . glucose blood (ACCU-CHEK AVIVA) test strip 1 each by Other route 2 (two) times daily. Use as instructed 100 each 5  . HYDROcodone-acetaminophen (NORCO/VICODIN) 5-325 MG tablet Take 0.5-1 tablets by mouth 2 (two) times daily as needed for severe pain. 60 tablet 0  . Lanc[employer] (ACCU-CHEK SOFTCLIX) lancets 1 each by Other route 2 (two) times daily. Use as instructed 1 each 5  . loratadine (CLARITIN) 10 MG tablet Take 10 mg by mouth daily as needed for allergies.     Marland Kitchen meclizine (ANTIVERT) 25 MG tablet Take 1 tablet (25 mg total) by mouth 3 (three) times daily as needed for  dizziness. 21 tablet 0  . ondansetron (ZOFRAN) 4 MG tablet Take 1 tablet (4 mg total) by mouth every 8 (eight) hours as needed for nausea or vomiting. 20 tablet 0  . OVER THE COUNTER MEDICATION Take 1 tablet by mouth as needed (joint pain).     . potassium chloride SA (K-DUR,KLOR-CON) 20 MEQ tablet Take 1 tablet (20 mEq total) by mouth daily. 90 tablet 3  . vitamin B-12 (CYANOCOBALAMIN) 100 MCG tablet     . vitamin B-12 (CYANOCOBALAMIN) 1000 MCG tablet Take 1,000 mcg by mouth daily as needed (energy).    . warfarin (COUMADIN) 10 MG tablet Take 1 tablet (10 mg total) by mouth daily. 90 tablet 3  . warfarin (COUMADIN) 5 MG tablet Take 1 tablet (5 mg total) by mouth daily. 90 tablet 3   No facility-administered medications prior to visit.     ROS Review of Systems  Constitutional: Negative for activity change, appetite change, chills, fatigue and unexpected weight change.  HENT: Positive for postnasal drip, rhinorrhea, sinus pressure and sore throat. Negative for congestion and mouth sores.   Eyes: Negative for visual disturbance.  Respiratory: Positive for cough. Negative for chest tightness.   Cardiovascular: Positive for palpitations.  Gastrointestinal: Negative for abdominal pain and nausea.  Genitourinary: Negative for difficulty urinating, frequency  and vaginal pain.  Musculoskeletal: Negative for back pain and gait problem.  Skin: Negative for pallor and rash.  Neurological: Negative for dizziness, tremors, weakness, numbness and headaches.  Psychiatric/Behavioral: Negative for confusion and sleep disturbance.    Objective:  BP 118/70   Pulse 87   Temp 98.6 F (37 C) (Oral)   Wt 176 lb (79.8 kg)   SpO2 96%   BMI 32.19 kg/m   BP Readings from Last 3 Encounters:  11/07/16 118/70  10/24/16 112/78  08/07/16 114/80    Wt Readings from Last 3 Encounters:  11/07/16 176 lb (79.8 kg)  10/24/16 182 lb (82.6 kg)  08/07/16 172 lb (78 kg)    Physical Exam  Constitutional: She  appears well-developed. No distress.  HENT:  Head: Normocephalic.  Right Ear: External ear normal.  Left Ear: External ear normal.  Nose: Nose normal.  Mouth/Throat: Oropharynx is clear and moist.  Eyes: Conjunctivae are normal. Pupils are equal, round, and reactive to light. Right eye exhibits no discharge. Left eye exhibits no discharge.  Neck: Normal range of motion. Neck supple. No JVD present. No tracheal deviation present. No thyromegaly present.  Cardiovascular: Normal rate.  Exam reveals no gallop.   Pulmonary/Chest: No stridor. No respiratory distress. She has no wheezes.  Abdominal: Soft. Bowel sounds are normal. She exhibits no distension and no mass. There is no tenderness. There is no rebound and no guarding.  Musculoskeletal: She exhibits no edema or tenderness.  Lymphadenopathy:    She has no cervical adenopathy.  Neurological: She displays normal reflexes. No cranial nerve deficit. She exhibits normal muscle tone. Coordination normal.  Skin: No rash noted. No erythema.  Psychiatric: She has a normal mood and affect. Her behavior is normal. Judgment and thought content normal.  eryth throat, nasal mucosa irreg irreg HR Click present eryth throat  Lab Results  Component Value Date   WBC 6.1 05/08/2016   HGB 12.4 05/08/2016   HCT 37.1 05/08/2016   PLT 287.0 05/08/2016   GLUCOSE 110 (H) 10/24/2016   CHOL 105 04/12/2015   TRIG 105.0 04/12/2015   HDL 38.50 (L) 04/12/2015   LDLCALC 46 04/12/2015   ALT 11 05/08/2016   AST 18 05/08/2016   NA 140 10/24/2016   K 4.4 10/24/2016   CL 106 10/24/2016   CREATININE 0.91 10/24/2016   BUN 15 10/24/2016   CO2 30 10/24/2016   TSH 2.37 04/12/2015   INR 2.7 09/25/2016   HGBA1C 5.9 08/07/2016    Mm Screening Breast Tomo Bilateral  Result Date: 09/01/2015 CLINICAL DATA:  Screening. EXAM: DIGITAL SCREENING BILATERAL MAMMOGRAM WITH 3D TOMO WITH CAD COMPARISON:  Previous exam(s). ACR Breast Density Category b: There are scattered  areas of fibroglandular density. FINDINGS: There are no findings suspicious for malignancy. Images were processed with CAD. IMPRESSION: No mammographic evidence of malignancy. A result letter of this screening mammogram will be mailed directly to the patient. RECOMMENDATION: Screening mammogram in one year. (Code:SM-B-01Y) BI-RADS CATEGORY  1: Negative. Electronically Signed   By: Skipper Cliche M.D.   On: 09/01/2015 13:39    Assessment & Plan:   There are no diagnoses linked to this encounter. I am having Ms. Eddins maintain her loratadine, cholecalciferol, acetaminophen, vitamin B-12, B-D SINGLE USE SWABS REGULAR, ACCU-CHEK AVIVA, ACCU-CHEK AVIVA PLUS, glucose blood, accu-chek softclix, potassium chloride SA, warfarin, warfarin, furosemide, amoxicillin, HYDROcodone-acetaminophen, OVER THE COUNTER MEDICATION, digoxin, diltiazem, donepezil, vitamin B-12, meclizine, and ondansetron.  No orders of the defined types were placed in this  encounter.    Follow-up: No Follow-up on file.  Walker Kehr, MD

## 2016-11-15 ENCOUNTER — Other Ambulatory Visit: Payer: Self-pay | Admitting: Internal Medicine

## 2016-11-15 DIAGNOSIS — R3129 Other microscopic hematuria: Secondary | ICD-10-CM

## 2016-12-09 DIAGNOSIS — N3001 Acute cystitis with hematuria: Secondary | ICD-10-CM | POA: Diagnosis not present

## 2016-12-09 DIAGNOSIS — R3129 Other microscopic hematuria: Secondary | ICD-10-CM | POA: Diagnosis not present

## 2016-12-11 ENCOUNTER — Ambulatory Visit: Payer: Commercial Managed Care - HMO

## 2016-12-20 DIAGNOSIS — N281 Cyst of kidney, acquired: Secondary | ICD-10-CM | POA: Diagnosis not present

## 2016-12-20 DIAGNOSIS — R3129 Other microscopic hematuria: Secondary | ICD-10-CM | POA: Diagnosis not present

## 2017-01-09 ENCOUNTER — Telehealth: Payer: Self-pay

## 2017-01-09 NOTE — Telephone Encounter (Signed)
PA FOR DIGOXIN INITIATED   KEY # [id number]

## 2017-01-13 NOTE — Telephone Encounter (Signed)
PA for Digoxin 250 mcg tablet approved through [employer]... Patient notified

## 2017-01-20 ENCOUNTER — Other Ambulatory Visit: Payer: Self-pay | Admitting: Cardiovascular Disease

## 2017-01-29 ENCOUNTER — Ambulatory Visit: Payer: Medicare HMO

## 2017-01-31 ENCOUNTER — Telehealth: Payer: Self-pay | Admitting: Internal Medicine

## 2017-01-31 NOTE — Telephone Encounter (Signed)
Noted  

## 2017-01-31 NOTE — Telephone Encounter (Signed)
Pt called returning your call. She asked if you could call her when you have a chance.

## 2017-02-03 ENCOUNTER — Ambulatory Visit (INDEPENDENT_AMBULATORY_CARE_PROVIDER_SITE_OTHER): Payer: Medicare HMO | Admitting: General Practice

## 2017-02-03 DIAGNOSIS — Z5181 Encounter for therapeutic drug level monitoring: Secondary | ICD-10-CM | POA: Diagnosis not present

## 2017-02-03 DIAGNOSIS — I4891 Unspecified atrial fibrillation: Secondary | ICD-10-CM

## 2017-02-03 DIAGNOSIS — Z9889 Other specified postprocedural states: Secondary | ICD-10-CM

## 2017-02-03 LAB — POCT INR: INR: 2.3

## 2017-02-03 NOTE — Patient Instructions (Signed)
Pre visit review using our clinic review tool, if applicable. No additional management support is needed unless otherwise documented below in the visit note. 

## 2017-02-03 NOTE — Progress Notes (Signed)
I agree with this plan.

## 2017-02-26 IMAGING — CR DG CHEST 2V
2 series · 2 of 2 positions shown · non-contrast
Comparison: 01/08/2013

CLINICAL DATA: Dizziness, cold and dehydration. Weakness, nausea
and diarrhea. Leg cramps. Symptoms 2 days.

EXAM:
CHEST  2 VIEW

[chest pa]
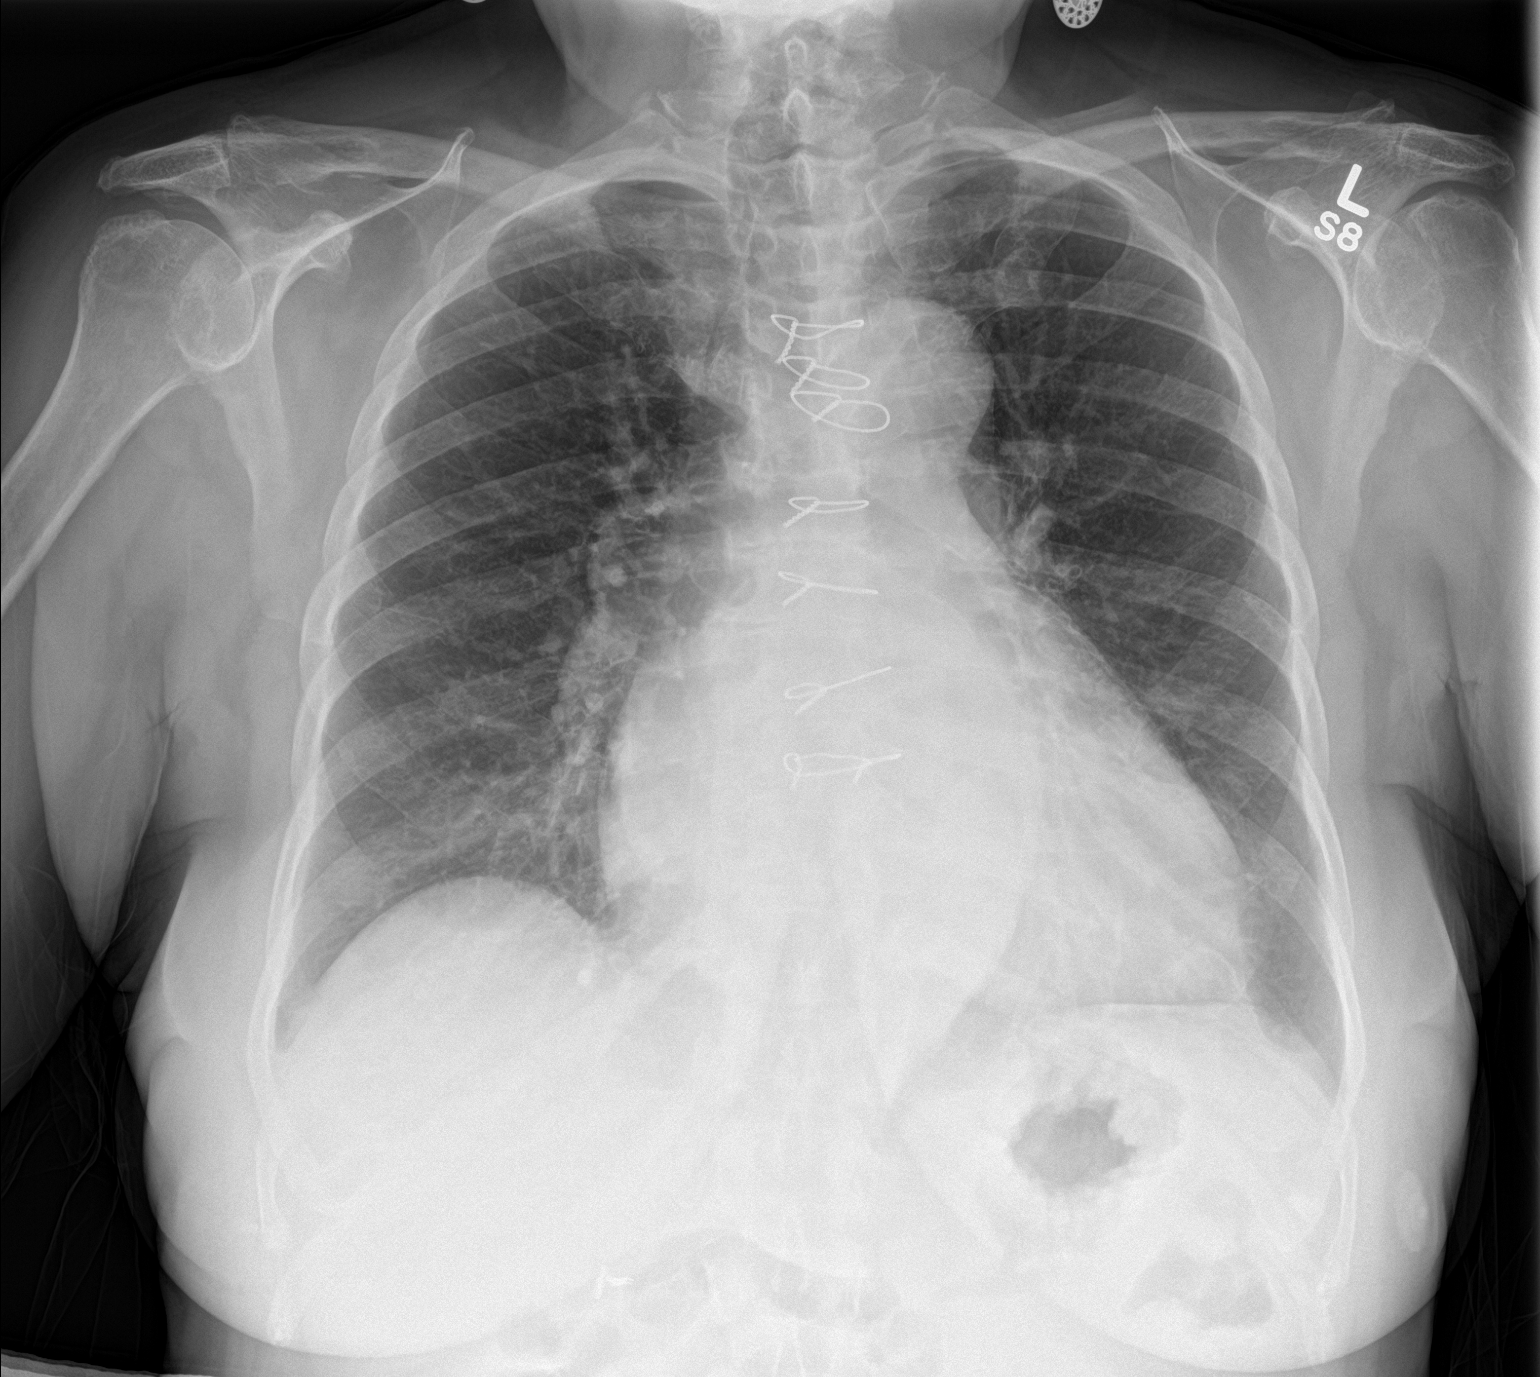

[chest lat]
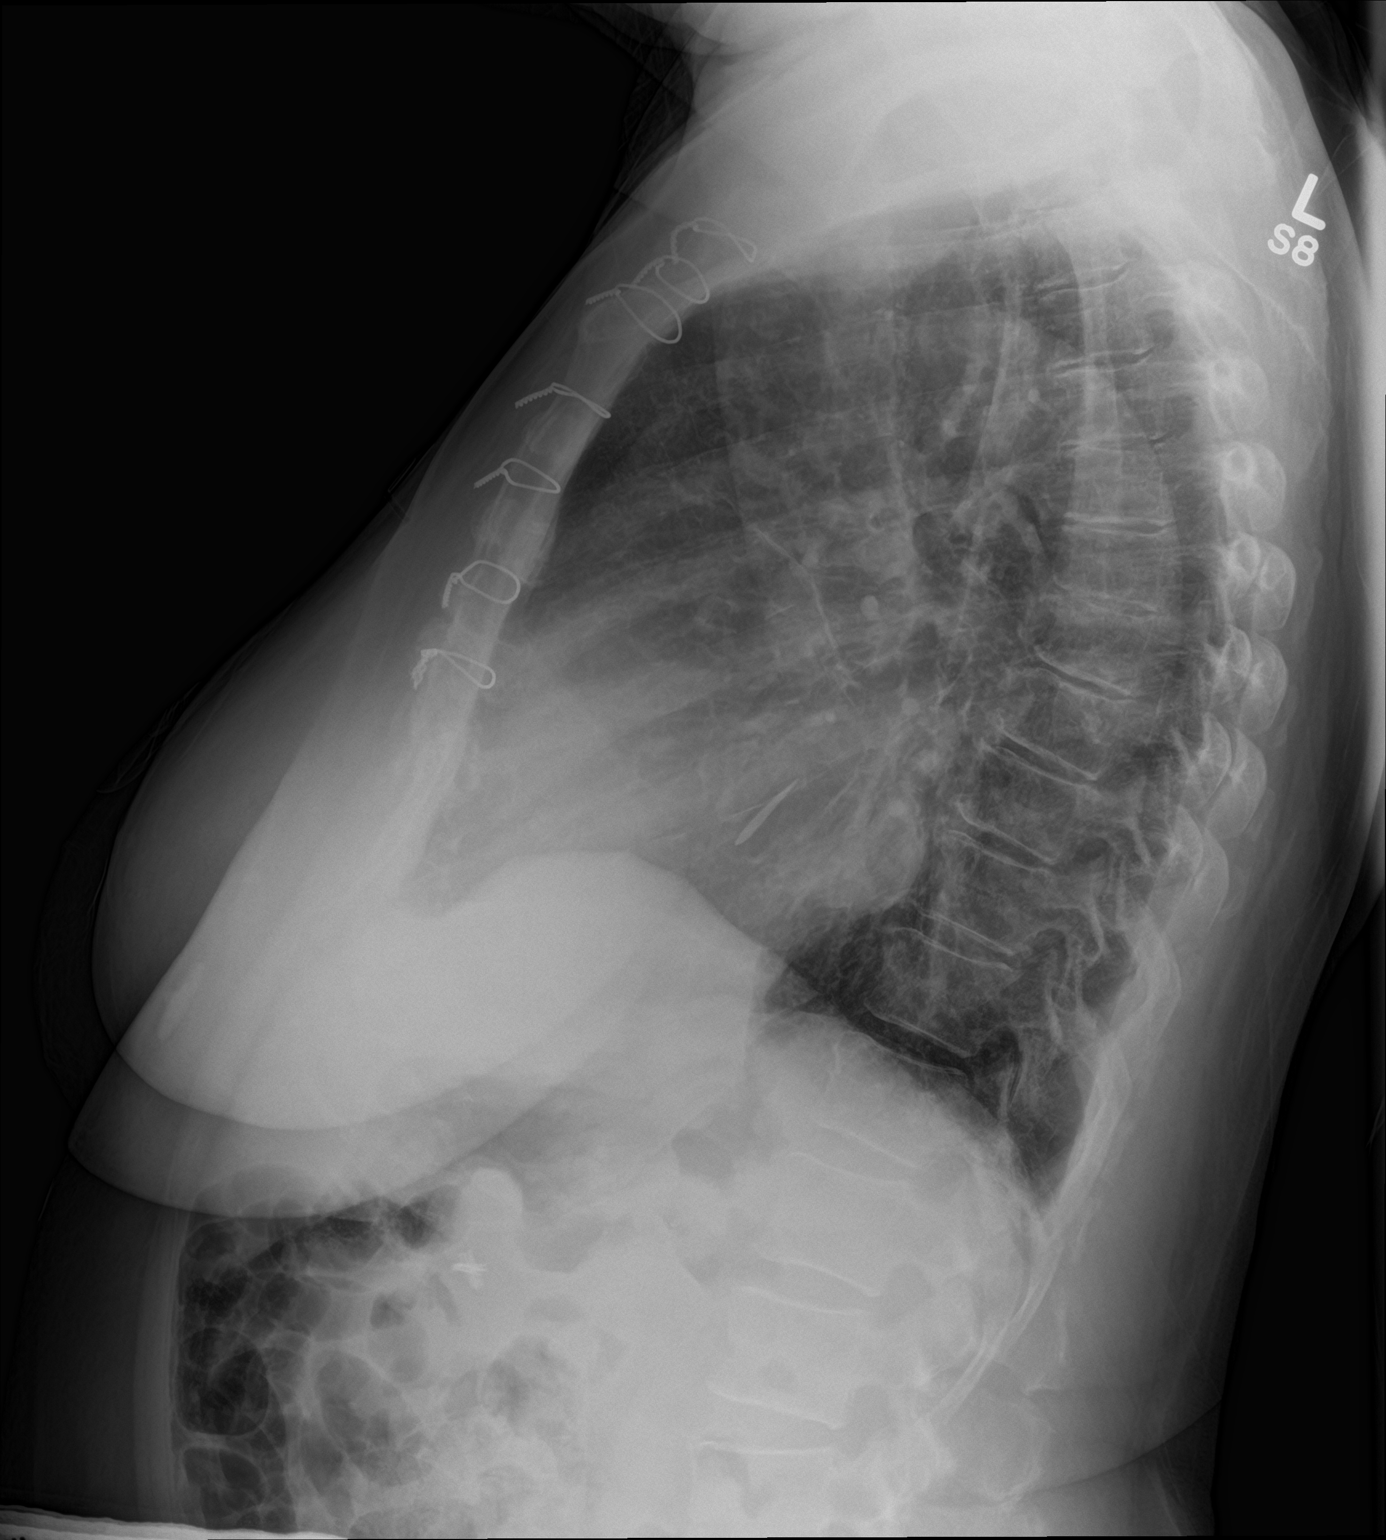

[2 of 2 positions shown; findings below may reference images not displayed]

FINDINGS: Sternotomy wires unchanged. Lungs are adequately inflated without
focal consolidation or effusion. Mild stable cardiomegaly with left
atrial enlargement. Remainder of the exam is unchanged.
IMPRESSION: No active cardiopulmonary disease.

Stable cardiomegaly with left atrial enlargement.

## 2017-03-05 ENCOUNTER — Ambulatory Visit (INDEPENDENT_AMBULATORY_CARE_PROVIDER_SITE_OTHER): Payer: Medicare HMO | Admitting: General Practice

## 2017-03-05 DIAGNOSIS — Z5181 Encounter for therapeutic drug level monitoring: Secondary | ICD-10-CM

## 2017-03-05 DIAGNOSIS — Z9889 Other specified postprocedural states: Secondary | ICD-10-CM

## 2017-03-05 LAB — POCT INR: INR: 2.9

## 2017-03-05 NOTE — Progress Notes (Signed)
I have reviewed and agree with the plan. 

## 2017-03-05 NOTE — Patient Instructions (Signed)
Pre visit review using our clinic review tool, if applicable. No additional management support is needed unless otherwise documented below in the visit note. 

## 2017-03-07 ENCOUNTER — Ambulatory Visit: Payer: Medicare HMO | Admitting: Internal Medicine

## 2017-03-12 ENCOUNTER — Encounter: Payer: Self-pay | Admitting: Internal Medicine

## 2017-03-12 ENCOUNTER — Ambulatory Visit (INDEPENDENT_AMBULATORY_CARE_PROVIDER_SITE_OTHER): Payer: Medicare HMO | Admitting: Internal Medicine

## 2017-03-12 DIAGNOSIS — E119 Type 2 diabetes mellitus without complications: Secondary | ICD-10-CM | POA: Diagnosis not present

## 2017-03-12 DIAGNOSIS — R32 Unspecified urinary incontinence: Secondary | ICD-10-CM

## 2017-03-12 DIAGNOSIS — R413 Other amnesia: Secondary | ICD-10-CM | POA: Diagnosis not present

## 2017-03-12 DIAGNOSIS — I10 Essential (primary) hypertension: Secondary | ICD-10-CM | POA: Diagnosis not present

## 2017-03-12 MED ORDER — MIRABEGRON ER 25 MG PO TB24: 25.0000 mg | ORAL_TABLET | Freq: Every day | ORAL | 11 refills | Status: DC

## 2017-03-12 MED ORDER — MEMANTINE HCL 5 MG PO TABS: 5.0000 mg | ORAL_TABLET | Freq: Two times a day (BID) | ORAL | 5 refills | Status: DC

## 2017-03-12 NOTE — Assessment & Plan Note (Addendum)
Cont w/Furosemide - try to use prn due to bladder issues

## 2017-03-12 NOTE — Assessment & Plan Note (Signed)
Aricept intolerant - d/c  try Namenda Neurol eval

## 2017-03-12 NOTE — Assessment & Plan Note (Signed)
Cont w/Furosemide - try to use prn Start Myrbetriq

## 2017-03-12 NOTE — Assessment & Plan Note (Signed)
Labs

## 2017-03-12 NOTE — Progress Notes (Signed)
Subjective:  Patient ID: Beth Miller, female    DOB: 1941-10-26  Age: 76 y.o. MRN: [id number]  CC: No chief complaint on file.   HPI Beth Miller presents for memory loss, urinary frequency, B12 def f/u  Outpatient Medications Prior to Visit  Medication Sig Dispense Refill  . acetaminophen (TYLENOL) 500 MG tablet Take 1,000 mg by mouth every 6 (six) hours as needed for mild pain.    . Alcohol Swabs (B-D SINGLE USE SWABS REGULAR) PADS 1 each by Does not apply route 2 (two) times daily. 100 each 5  . Blood Glucose Calibration (ACCU-CHEK AVIVA) SOLN 1 each by In Vitro route as directed. 1 each 2  . Blood Glucose Monitoring Suppl (ACCU-CHEK AVIVA PLUS) w/Device KIT 1 each by Does not apply route 2 (two) times daily. 1 kit 0  . Cholecalciferol (VITAMIN D3) 1000 UNIT tablet Take 1,000 Units by mouth daily.      . digoxin (LANOXIN) 0.25 MG tablet Take 1 tablet (0.25 mg total) by mouth at bedtime. 7 tablet 0  . diltiazem (CARDIZEM CD) 180 MG 24 hr capsule Take 1 capsule (180 mg total) by mouth daily. 90 capsule 3  . donepezil (ARICEPT) 5 MG tablet TAKE 1 TABLET AT BEDTIME 90 tablet 3  . furosemide (LASIX) 40 MG tablet Take 1 tablet (40 mg total) by mouth at bedtime. 90 tablet 3  . glucose blood (ACCU-CHEK AVIVA) test strip 1 each by Other route 2 (two) times daily. Use as instructed 100 each 5  . HYDROcodone-acetaminophen (NORCO/VICODIN) 5-325 MG tablet Take 0.5-1 tablets by mouth 2 (two) times daily as needed for severe pain. 60 tablet 0  . Lancet Devices (ACCU-CHEK SOFTCLIX) lancets 1 each by Other route 2 (two) times daily. Use as instructed 1 each 5  . loratadine (CLARITIN) 10 MG tablet Take 10 mg by mouth daily as needed for allergies.     Marland Kitchen meclizine (ANTIVERT) 25 MG tablet Take 1 tablet (25 mg total) by mouth 3 (three) times daily as needed for dizziness. 21 tablet 0  . ondansetron (ZOFRAN) 4 MG tablet Take 1 tablet (4 mg total) by mouth every 8 (eight) hours as needed for nausea or  vomiting. 20 tablet 0  . OVER THE COUNTER MEDICATION Take 1 tablet by mouth as needed (joint pain).     . potassium chloride SA (K-DUR,KLOR-CON) 20 MEQ tablet Take 1 tablet (20 mEq total) by mouth daily. 90 tablet 3  . vitamin B-12 (CYANOCOBALAMIN) 100 MCG tablet     . vitamin B-12 (CYANOCOBALAMIN) 1000 MCG tablet Take 1,000 mcg by mouth daily as needed (energy).    . warfarin (COUMADIN) 10 MG tablet Take 1 tablet (10 mg total) by mouth daily. 90 tablet 3  . warfarin (COUMADIN) 5 MG tablet Take 1 tablet (5 mg total) by mouth daily. 90 tablet 3  . amoxicillin (AMOXIL) 500 MG tablet Take four tablets one hour prior to dental procedure (Patient not taking: Reported on 03/12/2017) 12 tablet 1   No facility-administered medications prior to visit.     ROS Review of Systems  Constitutional: Negative for activity change, appetite change, chills, fatigue and unexpected weight change.  HENT: Negative for congestion, mouth sores and sinus pressure.   Eyes: Negative for visual disturbance.  Respiratory: Negative for cough and chest tightness.   Gastrointestinal: Negative for abdominal pain and nausea.  Genitourinary: Negative for difficulty urinating, frequency and vaginal pain.  Musculoskeletal: Negative for back pain and gait problem.  Skin:  Negative for pallor and rash.  Neurological: Negative for dizziness, tremors, weakness, numbness and headaches.  Psychiatric/Behavioral: Positive for decreased concentration. Negative for confusion, sleep disturbance and suicidal ideas. The patient is not nervous/anxious.     Objective:  BP 122/68 (BP Location: Left Arm, Patient Position: Sitting, Cuff Size: Normal)   Pulse 96   Temp 98.2 F (36.8 C) (Oral)   Ht _0  (1.575 m)   Wt 175 lb (79.4 kg)   SpO2 96%   BMI 32.01 kg/m   BP Readings from Last 3 Encounters:  03/12/17 122/68  11/07/16 118/70  10/24/16 112/78    Wt Readings from Last 3 Encounters:  03/12/17 175 lb (79.4 kg)  11/07/16 176  lb (79.8 kg)  10/24/16 182 lb (82.6 kg)    Physical Exam  Constitutional: She appears well-developed. No distress.  HENT:  Head: Normocephalic.  Right Ear: External ear normal.  Left Ear: External ear normal.  Nose: Nose normal.  Mouth/Throat: Oropharynx is clear and moist.  Eyes: Conjunctivae are normal. Pupils are equal, round, and reactive to light. Right eye exhibits no discharge. Left eye exhibits no discharge.  Neck: Normal range of motion. Neck supple. No JVD present. No tracheal deviation present. No thyromegaly present.  Cardiovascular: Normal rate, regular rhythm and normal heart sounds.   Pulmonary/Chest: No stridor. No respiratory distress. She has no wheezes.  Abdominal: Soft. Bowel sounds are normal. She exhibits no distension and no mass. There is no tenderness. There is no rebound and no guarding.  Musculoskeletal: She exhibits no edema or tenderness.  Lymphadenopathy:    She has no cervical adenopathy.  Neurological: She displays normal reflexes. No cranial nerve deficit. She exhibits normal muscle tone. Coordination normal.  Skin: No rash noted. No erythema.  Psychiatric: She has a normal mood and affect. Her behavior is normal. Judgment and thought content normal.    Lab Results  Component Value Date   WBC 4.2 11/07/2016   HGB 13.3 11/07/2016   HCT 39.4 11/07/2016   PLT 220.0 11/07/2016   GLUCOSE 124 (H) 11/07/2016   CHOL 113 11/07/2016   TRIG 111.0 11/07/2016   HDL 42.80 11/07/2016   LDLCALC 48 11/07/2016   ALT 13 11/07/2016   AST 22 11/07/2016   NA 141 11/07/2016   K 4.2 11/07/2016   CL 101 11/07/2016   CREATININE 1.06 11/07/2016   BUN 14 11/07/2016   CO2 33 (H) 11/07/2016   TSH 1.98 11/07/2016   INR 2.9 03/05/2017   HGBA1C 6.0 11/07/2016    Mm Screening Breast Tomo Bilateral  Result Date: 08/31/2015 CLINICAL DATA:  Screening. EXAM: DIGITAL SCREENING BILATERAL MAMMOGRAM WITH 3D TOMO WITH CAD COMPARISON:  Previous exam(s). ACR Breast Density  Category b: There are scattered areas of fibroglandular density. FINDINGS: There are no findings suspicious for malignancy. Images were processed with CAD. IMPRESSION: No mammographic evidence of malignancy. A result letter of this screening mammogram will be mailed directly to the patient. RECOMMENDATION: Screening mammogram in one year. (Code:SM-B-01Y) BI-RADS CATEGORY  1: Negative. Electronically Signed   By: Skipper Cliche M.D.   On: 09/01/2015 13:39    Assessment & Plan:   There are no diagnoses linked to this encounter. I am having Ms. Lukehart maintain her loratadine, cholecalciferol, acetaminophen, vitamin B-12, B-D SINGLE USE SWABS REGULAR, ACCU-CHEK AVIVA, ACCU-CHEK AVIVA PLUS, glucose blood, accu-chek softclix, potassium chloride SA, warfarin, warfarin, furosemide, amoxicillin, HYDROcodone-acetaminophen, OVER THE COUNTER MEDICATION, digoxin, donepezil, vitamin B-12, meclizine, ondansetron, and diltiazem.  No orders of the  defined types were placed in this encounter.    Follow-up: No Follow-up on file.  Walker Kehr, MD

## 2017-03-20 DIAGNOSIS — Z961 Presence of intraocular lens: Secondary | ICD-10-CM | POA: Diagnosis not present

## 2017-03-20 DIAGNOSIS — H26491 Other secondary cataract, right eye: Secondary | ICD-10-CM | POA: Diagnosis not present

## 2017-03-20 DIAGNOSIS — H524 Presbyopia: Secondary | ICD-10-CM | POA: Diagnosis not present

## 2017-03-20 DIAGNOSIS — E119 Type 2 diabetes mellitus without complications: Secondary | ICD-10-CM | POA: Diagnosis not present

## 2017-04-09 ENCOUNTER — Ambulatory Visit: Payer: Medicare HMO

## 2017-04-14 DIAGNOSIS — R311 Benign essential microscopic hematuria: Secondary | ICD-10-CM | POA: Diagnosis not present

## 2017-04-16 ENCOUNTER — Other Ambulatory Visit: Payer: Self-pay | Admitting: Internal Medicine

## 2017-04-16 ENCOUNTER — Other Ambulatory Visit: Payer: Self-pay | Admitting: General Practice

## 2017-04-16 MED ORDER — WARFARIN SODIUM 10 MG PO TABS: 10.0000 mg | ORAL_TABLET | Freq: Every day | ORAL | 1 refills | Status: DC

## 2017-05-14 ENCOUNTER — Ambulatory Visit: Payer: Medicare HMO | Attending: Audiology | Admitting: Audiology

## 2017-05-21 ENCOUNTER — Ambulatory Visit: Payer: Medicare HMO | Admitting: General Practice

## 2017-05-21 ENCOUNTER — Encounter: Payer: Self-pay | Admitting: Internal Medicine

## 2017-05-21 ENCOUNTER — Ambulatory Visit (INDEPENDENT_AMBULATORY_CARE_PROVIDER_SITE_OTHER): Payer: Medicare HMO | Admitting: Internal Medicine

## 2017-05-21 ENCOUNTER — Other Ambulatory Visit (INDEPENDENT_AMBULATORY_CARE_PROVIDER_SITE_OTHER): Payer: Medicare HMO

## 2017-05-21 DIAGNOSIS — I4819 Other persistent atrial fibrillation: Secondary | ICD-10-CM

## 2017-05-21 DIAGNOSIS — I481 Persistent atrial fibrillation: Secondary | ICD-10-CM

## 2017-05-21 DIAGNOSIS — R202 Paresthesia of skin: Secondary | ICD-10-CM

## 2017-05-21 DIAGNOSIS — E559 Vitamin D deficiency, unspecified: Secondary | ICD-10-CM | POA: Diagnosis not present

## 2017-05-21 DIAGNOSIS — I10 Essential (primary) hypertension: Secondary | ICD-10-CM | POA: Diagnosis not present

## 2017-05-21 DIAGNOSIS — M79604 Pain in right leg: Secondary | ICD-10-CM | POA: Diagnosis not present

## 2017-05-21 DIAGNOSIS — E119 Type 2 diabetes mellitus without complications: Secondary | ICD-10-CM | POA: Diagnosis not present

## 2017-05-21 DIAGNOSIS — Z5181 Encounter for therapeutic drug level monitoring: Secondary | ICD-10-CM

## 2017-05-21 DIAGNOSIS — Z9889 Other specified postprocedural states: Secondary | ICD-10-CM

## 2017-05-21 DIAGNOSIS — I4891 Unspecified atrial fibrillation: Secondary | ICD-10-CM

## 2017-05-21 LAB — BASIC METABOLIC PANEL
BUN: 14 mg/dL (ref 6–23)
CO2: 31 meq/L (ref 19–32)
Calcium: 9.5 mg/dL (ref 8.4–10.5)
Chloride: 104 mEq/L (ref 96–112)
Creatinine, Ser: 0.99 mg/dL (ref 0.40–1.20)
GFR: 69.81 mL/min (ref >60.00)
Glucose, Bld: 140 mg/dL — ABNORMAL HIGH (ref 70–99)
POTASSIUM: 4.1 meq/L (ref 3.5–5.1)
Sodium: 140 mEq/L (ref 135–145)

## 2017-05-21 LAB — CBC WITH DIFFERENTIAL/PLATELET
BASOS ABS: 0.1 10*3/uL (ref 0.0–0.1)
Basophils Relative: 1 % (ref 0.0–3.0)
EOS ABS: 0.1 10*3/uL (ref 0.0–0.7)
Eosinophils Relative: 1.2 % (ref 0.0–5.0)
HCT: 37.1 % (ref 36.0–46.0)
Hemoglobin: 12.3 g/dL (ref 12.0–15.0)
LYMPHS ABS: 1.8 10*3/uL (ref 0.7–4.0)
Lymphocytes Relative: 33.8 % (ref 12.0–46.0)
MCHC: 33.1 g/dL (ref 30.0–36.0)
MCV: 84.1 fl (ref 78.0–100.0)
MONO ABS: 0.7 10*3/uL (ref 0.1–1.0)
MONOS PCT: 13.6 % — AB (ref 3.0–12.0)
NEUTROS PCT: 50.4 % (ref 43.0–77.0)
Neutro Abs: 2.7 10*3/uL (ref 1.4–7.7)
PLATELETS: 271 10*3/uL (ref 150.0–400.0)
RBC: 4.41 Mil/uL (ref 3.87–5.11)
RDW: 15.5 % (ref 11.5–15.5)
WBC: 5.5 10*3/uL (ref 4.0–10.5)

## 2017-05-21 LAB — HEPATIC FUNCTION PANEL
ALK PHOS: 71 U/L (ref 39–117)
ALT: 16 U/L (ref 0–35)
AST: 19 U/L (ref 0–37)
Albumin: 3.9 g/dL (ref 3.5–5.2)
BILIRUBIN DIRECT: 0.1 mg/dL (ref 0.0–0.3)
BILIRUBIN TOTAL: 0.6 mg/dL (ref 0.2–1.2)
Total Protein: 7.3 g/dL (ref 6.0–8.3)

## 2017-05-21 LAB — TSH: TSH: 1.96 u[IU]/mL (ref 0.35–4.50)

## 2017-05-21 LAB — POCT INR: INR: 2.8

## 2017-05-21 LAB — HEMOGLOBIN A1C: HEMOGLOBIN A1C: 6.7 % — AB (ref 4.6–6.5)

## 2017-05-21 LAB — VITAMIN B12: Vitamin B-12: 1154 pg/mL — ABNORMAL HIGH (ref 211–911)

## 2017-05-21 MED ORDER — WARFARIN SODIUM 10 MG PO TABS: 10.0000 mg | ORAL_TABLET | Freq: Every day | ORAL | 1 refills | Status: DC

## 2017-05-21 MED ORDER — METFORMIN HCL 500 MG PO TABS: 500.0000 mg | ORAL_TABLET | Freq: Every day | ORAL | 11 refills | Status: DC

## 2017-05-21 MED ORDER — GABAPENTIN 100 MG PO CAPS: 100.0000 mg | ORAL_CAPSULE | Freq: Two times a day (BID) | ORAL | 3 refills | Status: DC | PRN

## 2017-05-21 NOTE — Assessment & Plan Note (Signed)
Pt declined Metformin Labs

## 2017-05-21 NOTE — Progress Notes (Signed)
Subjective:  Patient ID: Beth Miller, female    DOB: August 23, 1941  Age: 76 y.o. MRN: [id number]  CC: Follow-up   HPI Beth Miller presents for HTN, B12 def, Vit D def f/u C/o R post shin painful knot x 2 days C/o wt gain C/o feet and hands being numb - "gloves, socks" worse at night...  Outpatient Medications Prior to Visit  Medication Sig Dispense Refill  . acetaminophen (TYLENOL) 500 MG tablet Take 1,000 mg by mouth every 6 (six) hours as needed for mild pain.    . Alcohol Swabs (B-D SINGLE USE SWABS REGULAR) PADS 1 each by Does not apply route 2 (two) times daily. 100 each 5  . amoxicillin (AMOXIL) 500 MG tablet Take four tablets one hour prior to dental procedure 12 tablet 1  . Blood Glucose Calibration (ACCU-CHEK AVIVA) SOLN 1 each by In Vitro route as directed. 1 each 2  . Blood Glucose Monitoring Suppl (ACCU-CHEK AVIVA PLUS) w/Device KIT 1 each by Does not apply route 2 (two) times daily. 1 kit 0  . Cholecalciferol (VITAMIN D3) 1000 UNIT tablet Take 1,000 Units by mouth daily.      . digoxin (LANOXIN) 0.25 MG tablet TAKE 1 TABLET AT BEDTIME 90 tablet 2  . diltiazem (CARDIZEM CD) 180 MG 24 hr capsule Take 1 capsule (180 mg total) by mouth daily. 90 capsule 3  . furosemide (LASIX) 40 MG tablet TAKE 1 TABLET (40 MG TOTAL) BY MOUTH AT BEDTIME. 90 tablet 2  . glucose blood (ACCU-CHEK AVIVA) test strip 1 each by Other route 2 (two) times daily. Use as instructed 100 each 5  . HYDROcodone-acetaminophen (NORCO/VICODIN) 5-325 MG tablet Take 0.5-1 tablets by mouth 2 (two) times daily as needed for severe pain. 60 tablet 0  . Lancet Devices (ACCU-CHEK SOFTCLIX) lancets 1 each by Other route 2 (two) times daily. Use as instructed 1 each 5  . loratadine (CLARITIN) 10 MG tablet Take 10 mg by mouth daily as needed for allergies.     Marland Kitchen meclizine (ANTIVERT) 25 MG tablet Take 1 tablet (25 mg total) by mouth 3 (three) times daily as needed for dizziness. 21 tablet 0  . memantine (NAMENDA) 5  MG tablet Take 1 tablet (5 mg total) by mouth 2 (two) times daily. 60 tablet 5  . mirabegron ER (MYRBETRIQ) 25 MG TB24 tablet Take 1 tablet (25 mg total) by mouth daily. 30 tablet 11  . ondansetron (ZOFRAN) 4 MG tablet Take 1 tablet (4 mg total) by mouth every 8 (eight) hours as needed for nausea or vomiting. 20 tablet 0  . OVER THE COUNTER MEDICATION Take 1 tablet by mouth as needed (joint pain).     . Potassium Chloride ER 20 MEQ TBCR TAKE 1 TABLET (20 MEQ TOTAL) BY MOUTH DAILY. 90 tablet 2  . vitamin B-12 (CYANOCOBALAMIN) 100 MCG tablet     . vitamin B-12 (CYANOCOBALAMIN) 1000 MCG tablet Take 1,000 mcg by mouth daily as needed (energy).    . warfarin (COUMADIN) 10 MG tablet Take 1 tablet (10 mg total) by mouth daily. 90 tablet 1  . warfarin (COUMADIN) 5 MG tablet Take 1 tablet (5 mg total) by mouth daily. 90 tablet 3   No facility-administered medications prior to visit.     ROS Review of Systems  Constitutional: Positive for unexpected weight change. Negative for activity change, appetite change, chills and fatigue.  HENT: Negative for congestion, mouth sores and sinus pressure.   Eyes: Negative for visual disturbance.  Respiratory: Negative for cough and chest tightness.   Gastrointestinal: Negative for abdominal pain and nausea.  Genitourinary: Negative for difficulty urinating, frequency and vaginal pain.  Musculoskeletal: Positive for arthralgias and back pain. Negative for gait problem.  Skin: Positive for color change. Negative for pallor and rash.  Neurological: Positive for numbness. Negative for dizziness, tremors, weakness and headaches.  Psychiatric/Behavioral: Negative for confusion and sleep disturbance.    Objective:  BP 130/80 (BP Location: Left Arm, Patient Position: Sitting, Cuff Size: Normal)   Pulse 79   Temp 98 F (36.7 C) (Oral)   Resp 12   Ht 5' 2"  (1.575 m)   Wt 186 lb (84.4 kg)   SpO2 99%   BMI 34.02 kg/m   BP Readings from Last 3 Encounters:    05/21/17 130/80  03/12/17 122/68  11/07/16 118/70    Wt Readings from Last 3 Encounters:  05/21/17 186 lb (84.4 kg)  03/12/17 175 lb (79.4 kg)  11/07/16 176 lb (79.8 kg)    Physical Exam  Constitutional: She appears well-developed. No distress.  HENT:  Head: Normocephalic.  Right Ear: External ear normal.  Left Ear: External ear normal.  Nose: Nose normal.  Mouth/Throat: Oropharynx is clear and moist.  Eyes: Pupils are equal, round, and reactive to light. Conjunctivae are normal. Right eye exhibits no discharge. Left eye exhibits no discharge.  Neck: Normal range of motion. Neck supple. No JVD present. No tracheal deviation present. No thyromegaly present.  Cardiovascular: Normal rate, regular rhythm and normal heart sounds.   Pulmonary/Chest: No stridor. No respiratory distress. She has no wheezes.  Abdominal: Soft. Bowel sounds are normal. She exhibits no distension and no mass. There is no tenderness. There is no rebound and no guarding.  Musculoskeletal: She exhibits tenderness. She exhibits no edema.  Lymphadenopathy:    She has no cervical adenopathy.  Neurological: She displays normal reflexes. No cranial nerve deficit. She exhibits normal muscle tone. Coordination normal.  Skin: No rash noted. No erythema.  Psychiatric: She has a normal mood and affect. Her behavior is normal. Judgment and thought content normal.  Obese R dist shin 3 cm eryth tender skin infiltration  Lab Results  Component Value Date   WBC 4.2 11/07/2016   HGB 13.3 11/07/2016   HCT 39.4 11/07/2016   PLT 220.0 11/07/2016   GLUCOSE 124 (H) 11/07/2016   CHOL 113 11/07/2016   TRIG 111.0 11/07/2016   HDL 42.80 11/07/2016   LDLCALC 48 11/07/2016   ALT 13 11/07/2016   AST 22 11/07/2016   NA 141 11/07/2016   K 4.2 11/07/2016   CL 101 11/07/2016   CREATININE 1.06 11/07/2016   BUN 14 11/07/2016   CO2 33 (H) 11/07/2016   TSH 1.98 11/07/2016   INR 2.9 03/05/2017   HGBA1C 6.0 11/07/2016    Mm  Screening Breast Tomo Bilateral  Result Date: 08/31/2015 CLINICAL DATA:  Screening. EXAM: DIGITAL SCREENING BILATERAL MAMMOGRAM WITH 3D TOMO WITH CAD COMPARISON:  Previous exam(s). ACR Breast Density Category b: There are scattered areas of fibroglandular density. FINDINGS: There are no findings suspicious for malignancy. Images were processed with CAD. IMPRESSION: No mammographic evidence of malignancy. A result letter of this screening mammogram will be mailed directly to the patient. RECOMMENDATION: Screening mammogram in one year. (Code:SM-B-01Y) BI-RADS CATEGORY  1: Negative. Electronically Signed   By: Skipper Cliche M.D.   On: 09/01/2015 13:39    Assessment & Plan:   There are no diagnoses linked to this encounter. I am  having Ms. Runner maintain her loratadine, cholecalciferol, acetaminophen, vitamin B-12, B-D SINGLE USE SWABS REGULAR, ACCU-CHEK AVIVA, ACCU-CHEK AVIVA PLUS, glucose blood, accu-chek softclix, warfarin, amoxicillin, HYDROcodone-acetaminophen, OVER THE COUNTER MEDICATION, vitamin B-12, meclizine, ondansetron, diltiazem, memantine, mirabegron ER, furosemide, Potassium Chloride ER, digoxin, and warfarin.  No orders of the defined types were placed in this encounter.    Follow-up: No Follow-up on file.  Walker Kehr, MD

## 2017-05-21 NOTE — Assessment & Plan Note (Signed)
On Vit D 

## 2017-05-21 NOTE — Patient Instructions (Signed)
Hemoglobin A1c Test Some of the sugar (glucose) that circulates in your blood sticks or binds to blood proteins. Hemoglobin (Hb or Hgb) is one type of blood protein that glucose binds to. It also carries oxygen in the red blood cells (RBCs). When glucose binds to Hb, the glucose-coated Hb is called glycated Hb. Once Hb is glycated, it remains that way for the life of the RBC. This is about 120 days. Rather than testing your blood glucose level on one single day, the hemoglobin A1c (HbA1c) test measures the average amount of glycated hemoglobin and, therefore, the average amount of glucose in your blood during the 3-4 months just before the test is done. The HbA1c test is used to monitor long-term control of blood sugar in people who have diabetes mellitus. The HbA1c test can also be used in addition to or in combination with fasting blood glucose level and oral glucose tolerance tests. What do the results mean? It is your responsibility to obtain your test results. Ask the lab or department performing the test when and how you will get your results. Contact your health care provider to discuss any questions you have about your results. Range of Normal Values Ranges for normal values may vary among different labs and hospitals. You should always check with your health care provider after having lab work or other tests done to discuss the meaning of your test results and whether your values are considered within normal limits. The ranges for normal HbA1c test results are as follows:  Adult or child without diabetes: 4-5.9%.  Adult or child with diabetes and good blood glucose control: less than 6.5%.  Several factors can affect HbA1c test results. These may include:  Diseases (hemoglobinopathies) that cause a change in the shape, size, or amount of Hb in your blood.  Longer than normal RBC life span.  Abnormally low levels of certain proteins in your blood.  Eating foods or taking supplements that  are high in vitamin C (ascorbic acid).  Meaning of Results Outside Normal Value Ranges Abnormally high HbA1c values are most commonly an indication of prediabetes mellitus and diabetes mellitus:  An HbA1c result of 5.7-6.4% is considered diagnostic of prediabetes mellitus.  An HbA1c result of 6.5% or higher on two separate occasions is considered diagnostic of diabetes mellitus.  Abnormally low HbA1c values can be caused by several health conditions. These may include:  Pregnancy.  A large amount of blood loss.  Blood transfusions.  Low red blood cell count (anemia). This is caused by premature destruction of red blood cells.  Long-term kidney failure.  Some unusual forms of Hb (Hb variants), such as sickle cell trait.  Discuss your test results with your health care provider. He or she will use the results to make a diagnosis and determine a treatment plan that is right for you. Talk with your health care provider to discuss your results, treatment options, and if necessary, the need for more tests. Talk with your health care provider if you have any questions about your results. This information is not intended to replace advice given to you by your health care provider. Make sure you discuss any questions you have with your health care provider. Document Released: 11/05/2004 Document Revised: 07/10/2016 Document Reviewed: 02/28/2014 Elsevier Interactive Patient Education  2017 Elsevier Inc.  

## 2017-05-21 NOTE — Assessment & Plan Note (Signed)
On Furosemide, [place] 

## 2017-05-21 NOTE — Assessment & Plan Note (Signed)
Cartia, Digoxin, Coumadin 

## 2017-05-21 NOTE — Assessment & Plan Note (Signed)
Socks and gloves neuropathy Pt declined Metformin Labs

## 2017-05-26 ENCOUNTER — Other Ambulatory Visit: Payer: Self-pay | Admitting: General Practice

## 2017-05-26 ENCOUNTER — Telehealth: Payer: Self-pay | Admitting: Internal Medicine

## 2017-05-26 ENCOUNTER — Other Ambulatory Visit: Payer: Self-pay

## 2017-05-26 MED ORDER — GLUCOSE BLOOD VI STRP: 1.0000 | ORAL_STRIP | Freq: Two times a day (BID) | 3 refills | Status: DC

## 2017-05-26 MED ORDER — WARFARIN SODIUM 10 MG PO TABS: ORAL_TABLET | ORAL | 1 refills | Status: DC

## 2017-05-26 MED ORDER — METFORMIN HCL 500 MG PO TABS: 500.0000 mg | ORAL_TABLET | Freq: Every day | ORAL | 3 refills | Status: DC

## 2017-05-26 MED ORDER — DILTIAZEM HCL ER COATED BEADS 180 MG PO CP24: 180.0000 mg | ORAL_CAPSULE | Freq: Every day | ORAL | 3 refills | Status: DC

## 2017-05-26 MED ORDER — MEMANTINE HCL 5 MG PO TABS: 5.0000 mg | ORAL_TABLET | Freq: Two times a day (BID) | ORAL | 3 refills | Status: DC

## 2017-05-26 NOTE — Telephone Encounter (Signed)
Pharmacy requesting a refill of  diltiazem (CARDIZEM CD) 180 MG 24 hr capsule memantine (NAMENDA) 5 MG tablet    [facility]

## 2017-05-26 NOTE — Telephone Encounter (Signed)
Reviewed chart pt is up-to-date sent refills to [facility] electronically...Beth Miller

## 2017-06-16 NOTE — Progress Notes (Signed)
Patient ID: Beth Miller, female   DOB: [birth year], 76 y.o.   MRN: [id number] Beth Miller is seen for f/u MVR  Has multiple issues which include a prosthetic MV - dating back to 1995, chronic atrial fib, on coumadin and carotid disease. She is obese. Past smoker and she is diabetic.   F/U myovue done 01/18/13 normal no gating due to afib  Echo 07/03/16 norma MVR EF 50-55% reviewed   Carotid 04/06/14 plaque no stenosis f/u 6/17 Multinodular goiter  Biopsy ok   She seems to do better with calcium blockers rather than beta blockers for rate control  Enjoying her bowling Husband has stage 4 prostate cancer He is 84   ROS: Denies fever, malais, weight loss, blurry vision, decreased visual acuity, cough, sputum, SOB, hemoptysis, pleuritic pain, palpitaitons, heartburn, abdominal pain, melena, lower extremity edema, claudication, or rash.  All other systems reviewed and negative  General: BP 116/72   Pulse 87   Ht 5' 2"  (1.575 m)   Wt 185 lb 12.8 oz (84.3 kg)   LMP  (LMP Unknown)   BMI 33.98 kg/m  Affect appropriate Healthy:  Black female  HEENT: normal Neck supple with no adenopathy JVP normal no bruits no thyromegaly Lungs clear with no wheezing and good diaphragmatic motion Heart:  S1 click /S2 no MR  murmur, no rub, gallop or click PMI normal Abdomen: benighn, BS positve, no tenderness, no AAA no bruit.  No HSM or HJR Distal pulses intact with no bruits No edema Neuro non-focal Skin warm and dry No muscular weakness     Current Outpatient Prescriptions  Medication Sig Dispense Refill  . acetaminophen (TYLENOL) 500 MG tablet Take 1,000 mg by mouth every 6 (six) hours as needed for mild pain.    . Alcohol Swabs (B-D SINGLE USE SWABS REGULAR) PADS 1 each by Does not apply route 2 (two) times daily. 100 each 5  . amoxicillin (AMOXIL) 500 MG tablet Take four tablets one hour prior to dental procedure 12 tablet 1  . Blood Glucose Calibration (ACCU-CHEK AVIVA) SOLN 1 each by In  Vitro route as directed. 1 each 2  . Blood Glucose Monitoring Suppl (ACCU-CHEK AVIVA PLUS) w/Device KIT 1 each by Does not apply route 2 (two) times daily. 1 kit 0  . Cholecalciferol (VITAMIN D3) 1000 UNIT tablet Take 1,000 Units by mouth daily.      . digoxin (LANOXIN) 0.25 MG tablet TAKE 1 TABLET AT BEDTIME 90 tablet 2  . diltiazem (CARDIZEM CD) 180 MG 24 hr capsule Take 1 capsule (180 mg total) by mouth daily. 90 capsule 3  . furosemide (LASIX) 40 MG tablet TAKE 1 TABLET (40 MG TOTAL) BY MOUTH AT BEDTIME. 90 tablet 2  . gabapentin (NEURONTIN) 100 MG capsule Take 1 capsule (100 mg total) by mouth 2 (two) times daily as needed. For neuropathy 90 capsule 3  . glucose blood (ACCU-CHEK AVIVA) test strip 1 each by Other route 2 (two) times daily. Use as instructed 200 each 3  . HYDROcodone-acetaminophen (NORCO/VICODIN) 5-325 MG tablet Take 0.5-1 tablets by mouth 2 (two) times daily as needed for severe pain. 60 tablet 0  . Lancet Devices (ACCU-CHEK SOFTCLIX) lancets 1 each by Other route 2 (two) times daily. Use as instructed 1 each 5  . loratadine (CLARITIN) 10 MG tablet Take 10 mg by mouth daily as needed for allergies.     Marland Kitchen meclizine (ANTIVERT) 25 MG tablet Take 1 tablet (25 mg total) by mouth 3 (three) times daily  as needed for dizziness. 21 tablet 0  . memantine (NAMENDA) 5 MG tablet Take 1 tablet (5 mg total) by mouth 2 (two) times daily. 180 tablet 3  . metFORMIN (GLUCOPHAGE) 500 MG tablet Take 1 tablet (500 mg total) by mouth daily with breakfast. 90 tablet 3  . mirabegron ER (MYRBETRIQ) 25 MG TB24 tablet Take 1 tablet (25 mg total) by mouth daily. 30 tablet 11  . ondansetron (ZOFRAN) 4 MG tablet Take 1 tablet (4 mg total) by mouth every 8 (eight) hours as needed for nausea or vomiting. 20 tablet 0  . OVER THE COUNTER MEDICATION Take 1 tablet by mouth as needed (joint pain).     . Potassium Chloride ER 20 MEQ TBCR TAKE 1 TABLET (20 MEQ TOTAL) BY MOUTH DAILY. 90 tablet 2  . vitamin B-12  (CYANOCOBALAMIN) 1000 MCG tablet Take 1,000 mcg by mouth daily as needed (energy).    . warfarin (COUMADIN) 10 MG tablet Take 1 tablet daily OR as directed by anticoagulation clinic. 90 tablet 1  . warfarin (COUMADIN) 5 MG tablet Take 1 tablet (5 mg total) by mouth daily. 90 tablet 3   No current facility-administered medications for this visit.     Allergies  Aricept [donepezil hcl]; Food; Furosemide; Quinine; Spironolactone; Tramadol; and Sulfadiazine  Electrocardiogram: 06/18/17 afib rate 87 LVH with strain   Assessment and Plan Afib:  Good anticoagulaiton seems to be forgetting to take meds from time to time  Anticoagulaiton:  INR RX no bleeding issues  Consider lovenox bridging if stopped due to afib and mechanical MVR Goiter:  Stable biopsy negative for tumor HTN:  Well controlled.  Continue current medications and low sodium Dash type diet.   Carotid: Plaque on duplex 6/15  No bruit observe  MVR:  Normal valve click  Echo reviewed 01/28/67 EF low normal normal MVR  F/u with me in year   Jenkins Rouge

## 2017-06-18 ENCOUNTER — Ambulatory Visit (INDEPENDENT_AMBULATORY_CARE_PROVIDER_SITE_OTHER): Payer: Medicare HMO | Admitting: Cardiovascular Disease

## 2017-06-18 ENCOUNTER — Encounter: Payer: Self-pay | Admitting: Cardiovascular Disease

## 2017-06-18 ENCOUNTER — Other Ambulatory Visit: Payer: Self-pay | Admitting: *Deleted

## 2017-06-18 VITALS — BP 116/72 | HR 87 | Ht 62.0 in | Wt 185.8 lb

## 2017-06-18 DIAGNOSIS — Z9889 Other specified postprocedural states: Secondary | ICD-10-CM | POA: Diagnosis not present

## 2017-06-18 DIAGNOSIS — I10 Essential (primary) hypertension: Secondary | ICD-10-CM | POA: Diagnosis not present

## 2017-06-18 MED ORDER — AMOXICILLIN 500 MG PO TABS: ORAL_TABLET | ORAL | 1 refills | Status: DC

## 2017-06-18 MED ORDER — GLUCOSE BLOOD VI STRP
1.0000 | ORAL_STRIP | Freq: Two times a day (BID) | 3 refills | Status: DC
Start: 2017-06-18 — End: 2019-05-26

## 2017-06-18 MED ORDER — ACCU-CHEK AVIVA PLUS W/DEVICE KIT: 1.0000 | PACK | Freq: Two times a day (BID) | 0 refills | Status: DC

## 2017-06-18 MED ORDER — ACCU-CHEK AVIVA VI SOLN: 1.0000 | 1 refills | Status: DC

## 2017-06-18 MED ORDER — BD SWAB SINGLE USE REGULAR PADS: 1.0000 | MEDICATED_PAD | Freq: Two times a day (BID) | 3 refills | Status: DC

## 2017-06-18 MED ORDER — ACCU-CHEK SOFTCLIX LANCETS MISC: 1.0000 | Freq: Two times a day (BID) | 3 refills | Status: DC

## 2017-06-18 NOTE — Patient Instructions (Addendum)

## 2017-07-02 ENCOUNTER — Ambulatory Visit (INDEPENDENT_AMBULATORY_CARE_PROVIDER_SITE_OTHER): Payer: Medicare HMO | Admitting: General Practice

## 2017-07-02 DIAGNOSIS — Z9889 Other specified postprocedural states: Secondary | ICD-10-CM | POA: Diagnosis not present

## 2017-07-02 DIAGNOSIS — Z5181 Encounter for therapeutic drug level monitoring: Secondary | ICD-10-CM

## 2017-07-02 DIAGNOSIS — I4891 Unspecified atrial fibrillation: Secondary | ICD-10-CM

## 2017-07-02 LAB — POCT INR: INR: 2.1

## 2017-07-02 NOTE — Patient Instructions (Signed)
Pre visit review using our clinic review tool, if applicable. No additional management support is needed unless otherwise documented below in the visit note. 

## 2017-07-23 ENCOUNTER — Ambulatory Visit (INDEPENDENT_AMBULATORY_CARE_PROVIDER_SITE_OTHER): Payer: Medicare HMO | Admitting: General Practice

## 2017-07-23 DIAGNOSIS — Z9889 Other specified postprocedural states: Secondary | ICD-10-CM

## 2017-07-23 DIAGNOSIS — Z7901 Long term (current) use of anticoagulants: Secondary | ICD-10-CM

## 2017-07-23 DIAGNOSIS — Z5181 Encounter for therapeutic drug level monitoring: Secondary | ICD-10-CM

## 2017-07-23 LAB — POCT INR: INR: 2.4

## 2017-07-23 NOTE — Progress Notes (Signed)
I have reviewed and agree with the plan. 

## 2017-07-23 NOTE — Patient Instructions (Signed)
Pre visit review using our clinic review tool, if applicable. No additional management support is needed unless otherwise documented below in the visit note. 

## 2017-07-24 ENCOUNTER — Ambulatory Visit: Payer: Medicare HMO | Admitting: Nurse Practitioner

## 2017-08-01 ENCOUNTER — Encounter: Payer: Self-pay | Admitting: Gastroenterology

## 2017-08-20 ENCOUNTER — Ambulatory Visit (INDEPENDENT_AMBULATORY_CARE_PROVIDER_SITE_OTHER): Payer: Medicare HMO | Admitting: General Practice

## 2017-08-20 NOTE — Patient Instructions (Signed)
Pre visit review using our clinic review tool, if applicable. No additional management support is needed unless otherwise documented below in the visit note. 

## 2017-08-21 ENCOUNTER — Ambulatory Visit: Payer: Medicare HMO | Admitting: Internal Medicine

## 2017-09-11 ENCOUNTER — Ambulatory Visit (INDEPENDENT_AMBULATORY_CARE_PROVIDER_SITE_OTHER): Payer: Medicare HMO

## 2017-09-11 DIAGNOSIS — Z23 Encounter for immunization: Secondary | ICD-10-CM

## 2017-10-01 ENCOUNTER — Telehealth: Payer: Self-pay | Admitting: Internal Medicine

## 2017-10-01 NOTE — Telephone Encounter (Signed)
Copied from [place] [phone]. Topic: Quick Communication - See Telephone Encounter >> Oct 01, 2017  5:34 PM Corie Chiquito, [place] wrote: CRM for notification. See Telephone encounter for: [facility] called because the patient is requesting to be on Warfarin 5mg . If someone could give them a call back about this at [phone]  10/01/17.

## 2017-10-02 NOTE — Telephone Encounter (Signed)
Does she need to have labs drawn before refilling Warfarin? Please advise.

## 2017-10-02 NOTE — Telephone Encounter (Signed)
Please advise 

## 2017-10-03 ENCOUNTER — Other Ambulatory Visit: Payer: Self-pay | Admitting: General Practice

## 2017-10-03 MED ORDER — WARFARIN SODIUM 5 MG PO TABS: ORAL_TABLET | ORAL | 0 refills | Status: DC

## 2017-10-03 NOTE — Telephone Encounter (Signed)
Noted. Refilled.

## 2017-10-13 ENCOUNTER — Telehealth: Payer: Self-pay | Admitting: Internal Medicine

## 2017-10-13 NOTE — Telephone Encounter (Signed)
Request for verification of coumadin orders; will route to [facility]

## 2017-10-13 NOTE — Telephone Encounter (Signed)
Noted. Instructions have been clarified with [employer].

## 2017-10-13 NOTE — Telephone Encounter (Signed)
Forwarding msg to cindy, RN w/coumadin clinic...Beth Miller

## 2017-12-10 ENCOUNTER — Other Ambulatory Visit: Payer: Self-pay | Admitting: Internal Medicine

## 2017-12-11 NOTE — Telephone Encounter (Signed)
Routing to dr plotnikov, please advise, thanks 

## 2017-12-26 ENCOUNTER — Other Ambulatory Visit: Payer: Self-pay | Admitting: General Practice

## 2017-12-26 ENCOUNTER — Ambulatory Visit: Payer: Medicare HMO | Admitting: General Practice

## 2017-12-26 DIAGNOSIS — I4891 Unspecified atrial fibrillation: Secondary | ICD-10-CM

## 2017-12-26 DIAGNOSIS — Z9889 Other specified postprocedural states: Secondary | ICD-10-CM

## 2017-12-26 DIAGNOSIS — Z7901 Long term (current) use of anticoagulants: Secondary | ICD-10-CM | POA: Diagnosis not present

## 2017-12-26 LAB — POCT INR: INR: 1.5

## 2017-12-26 MED ORDER — WARFARIN SODIUM 10 MG PO TABS: ORAL_TABLET | ORAL | 0 refills | Status: DC

## 2017-12-26 NOTE — Patient Instructions (Addendum)
Pre visit review using our clinic review tool, if applicable. No additional management support is needed unless otherwise documented below in the visit note.  Take 15 mg today and tomorrow (3/1 and 3/2) and then continue to take 12.5 mg every day except take 10 mg on Mondays and Fridays.  Re-check in 10 to 14 days.

## 2018-01-06 ENCOUNTER — Ambulatory Visit: Payer: Medicare HMO

## 2018-01-30 ENCOUNTER — Ambulatory Visit (HOSPITAL_COMMUNITY)
Admission: EM | Admit: 2018-01-30 | Discharge: 2018-01-30 | Disposition: A | Discharge status: Home / Self Care | Payer: Medicare HMO | Attending: Internal Medicine | Admitting: Internal Medicine

## 2018-01-30 ENCOUNTER — Encounter (HOSPITAL_COMMUNITY): Payer: Self-pay | Admitting: Emergency Medicine

## 2018-01-30 DIAGNOSIS — R21 Rash and other nonspecific skin eruption: Secondary | ICD-10-CM

## 2018-01-30 MED ORDER — PERMETHRIN 5 % EX CREA: TOPICAL_CREAM | CUTANEOUS | 0 refills | Status: DC

## 2018-01-30 NOTE — Discharge Instructions (Addendum)
Topical: Cream 5%: Thoroughly massage cream (30 g for average adult) from head to soles of feet; leave on for 8 to 14 hours before removing (shower or bath); for infants and the elderly, also apply on the hairline, neck, scalp, temple, and forehead; may repeat if living mites are observed 14 days after first treatment; one application is generally curative.  Itching may persist for a few weeks after treatment.  Otherwise I would continue to do moisturizing efforts including using moisturizing creams and oils to help with itching as this may be related to dry skin.
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
## 2018-01-30 NOTE — ED Triage Notes (Signed)
Pt sts generalized itching with some rash x 2 months

## 2018-02-18 ENCOUNTER — Encounter (HOSPITAL_COMMUNITY): Payer: Self-pay | Admitting: Emergency Medicine

## 2018-02-18 ENCOUNTER — Emergency Department (HOSPITAL_COMMUNITY)
Admission: EM | Admit: 2018-02-18 | Discharge: 2018-02-19 | Disposition: A | Discharge status: Home / Self Care | Payer: Medicare HMO | Attending: Physician Assistant | Admitting: Physician Assistant

## 2018-02-18 DIAGNOSIS — Z5321 Procedure and treatment not carried out due to patient leaving prior to being seen by health care provider: Secondary | ICD-10-CM | POA: Insufficient documentation

## 2018-02-18 DIAGNOSIS — R1110 Vomiting, unspecified: Secondary | ICD-10-CM | POA: Diagnosis not present

## 2018-02-18 DIAGNOSIS — M6281 Muscle weakness (generalized): Secondary | ICD-10-CM | POA: Diagnosis not present

## 2018-02-18 LAB — COMPREHENSIVE METABOLIC PANEL
ALBUMIN: 3.9 g/dL (ref 3.5–5.0)
ALK PHOS: 65 U/L (ref 38–126)
ALT: 14 U/L (ref 14–54)
ANION GAP: 8 (ref 5–15)
AST: 23 U/L (ref 15–41)
BUN: 12 mg/dL (ref 6–20)
CALCIUM: 9.7 mg/dL (ref 8.9–10.3)
CHLORIDE: 103 mmol/L (ref 101–111)
CO2: 28 mmol/L (ref 22–32)
Creatinine, Ser: 1.01 mg/dL — ABNORMAL HIGH (ref 0.44–1.00)
GFR calc non Af Amer: 52 mL/min — ABNORMAL LOW (ref >60)
GLUCOSE: 162 mg/dL — AB (ref 65–99)
POTASSIUM: 3.7 mmol/L (ref 3.5–5.1)
Sodium: 139 mmol/L (ref 135–145)
Total Bilirubin: 1.2 mg/dL (ref 0.3–1.2)
Total Protein: 7.5 g/dL (ref 6.5–8.1)

## 2018-02-18 LAB — CBC
HEMATOCRIT: 35.6 % — AB (ref 36.0–46.0)
Hemoglobin: 12.1 g/dL (ref 12.0–15.0)
MCH: 27.5 pg (ref 26.0–34.0)
MCHC: 34 g/dL (ref 30.0–36.0)
MCV: 80.9 fL (ref 78.0–100.0)
Platelets: 293 10*3/uL (ref 150–400)
RBC: 4.4 MIL/uL (ref 3.87–5.11)
RDW: 15.2 % (ref 11.5–15.5)
WBC: 6.2 10*3/uL (ref 4.0–10.5)

## 2018-02-18 LAB — URINALYSIS, ROUTINE W REFLEX MICROSCOPIC
BACTERIA UA: NONE SEEN
BILIRUBIN URINE: NEGATIVE
Glucose, UA: NEGATIVE mg/dL
KETONES UR: NEGATIVE mg/dL
LEUKOCYTES UA: NEGATIVE
Nitrite: NEGATIVE
Protein, ur: NEGATIVE mg/dL
Specific Gravity, Urine: 1.012 (ref 1.005–1.030)
pH: 5 (ref 5.0–8.0)

## 2018-02-18 LAB — CBG MONITORING, ED: GLUCOSE-CAPILLARY: 159 mg/dL — AB (ref 65–99)

## 2018-02-18 LAB — LIPASE, BLOOD: Lipase: 30 U/L (ref 11–51)

## 2018-02-18 NOTE — ED Notes (Signed)
Results reviewed.  No changes to acuity at this time

## 2018-02-18 NOTE — ED Triage Notes (Signed)
Pt reports generalized weakness and vomiting onset tonight. Reports decreased appetite x2 days, feeling sick. Denies diarrhea.

## 2018-02-19 NOTE — ED Notes (Signed)
Pt no longer in room. PA returned 3 times for assessment. Pt had told this RN and EMT that she needed to leave around 9 for work.

## 2018-02-19 NOTE — ED Provider Notes (Signed)
Pt here for weakness and emesis.  I did not have a chance to see or evaluate pt.  She left AMA.     Domenic Moras, PA-C 02/19/18 [id number]    Macarthur Critchley, MD 02/25/18 [phone]

## 2018-02-19 NOTE — ED Notes (Signed)
Pt brought back to room, states she does not want to put a gown on or be hooked up to the monitor. She states that she has to be at work at 0900 and might not stay.

## 2018-03-03 ENCOUNTER — Ambulatory Visit: Payer: Medicare HMO

## 2018-03-11 ENCOUNTER — Ambulatory Visit: Payer: Medicare HMO | Admitting: Internal Medicine

## 2018-03-11 DIAGNOSIS — Z0289 Encounter for other administrative examinations: Secondary | ICD-10-CM

## 2018-04-14 ENCOUNTER — Telehealth: Payer: Self-pay | Admitting: General Practice

## 2018-04-14 NOTE — Telephone Encounter (Signed)
LMOVM for patient to call to schedule appointment with Villa Herb, RN for INR check.

## 2018-05-20 ENCOUNTER — Encounter: Payer: Self-pay | Admitting: Internal Medicine

## 2018-05-20 ENCOUNTER — Ambulatory Visit: Payer: Medicare HMO | Admitting: Internal Medicine

## 2018-05-20 VITALS — BP 138/76 | HR 81 | Temp 98.2°F | Ht 62.0 in | Wt 183.0 lb

## 2018-05-20 DIAGNOSIS — E119 Type 2 diabetes mellitus without complications: Secondary | ICD-10-CM

## 2018-05-20 DIAGNOSIS — B079 Viral wart, unspecified: Secondary | ICD-10-CM | POA: Insufficient documentation

## 2018-05-20 DIAGNOSIS — L509 Urticaria, unspecified: Secondary | ICD-10-CM

## 2018-05-20 MED ORDER — HYDROXYZINE HCL 25 MG PO TABS: 25.0000 mg | ORAL_TABLET | Freq: Three times a day (TID) | ORAL | 1 refills | Status: DC | PRN

## 2018-05-20 MED ORDER — TRIAMCINOLONE ACETONIDE 0.5 % EX CREA: 1.0000 "application " | TOPICAL_CREAM | Freq: Three times a day (TID) | CUTANEOUS | 1 refills | Status: DC | PRN

## 2018-05-20 NOTE — Assessment & Plan Note (Signed)
Cryo  

## 2018-05-20 NOTE — Patient Instructions (Signed)
   Postprocedure instructions :     Keep the wounds clean. You can wash them with liquid soap and water. Pat dry with gauze or a Kleenex tissue  Before applying antibiotic ointment and a Band-Aid.   You need to report immediately  if  any signs of infection develop.    

## 2018-05-20 NOTE — Progress Notes (Signed)
Subjective:  Patient ID: Beth Miller, female    DOB: 05-Sep-1941  Age: 77 y.o. MRN: [id number]  CC: No chief complaint on file.   HPI Beth Miller presents for skin itching x weeks H/o bed bugs in the house. Hives at night F/u DM  Outpatient Medications Prior to Visit  Medication Sig Dispense Refill  . ACCU-CHEK SOFTCLIX LANCETS lancets 1 each by Other route 2 (two) times daily. Use to check blood sugars twice a day 200 each 3  . acetaminophen (TYLENOL) 500 MG tablet Take 1,000 mg by mouth every 6 (six) hours as needed for mild pain.    . Alcohol Swabs (B-D SINGLE USE SWABS REGULAR) PADS 1 each by Does not apply route 2 (two) times daily. Use to clean finger to check blood sugars twice a day 200 each 3  . amoxicillin (AMOXIL) 500 MG tablet Take four tablets one hour prior to dental procedure 12 tablet 1  . Blood Glucose Calibration (ACCU-CHEK AVIVA) SOLN 1 each by In Vitro route as directed. 3 each 1  . Blood Glucose Monitoring Suppl (ACCU-CHEK AVIVA PLUS) w/Device KIT 1 each by Does not apply route 2 (two) times daily. Use as directed 1 kit 0  . Cholecalciferol (VITAMIN D3) 1000 UNIT tablet Take 1,000 Units by mouth daily.      . digoxin (LANOXIN) 0.25 MG tablet TAKE 1 TABLET AT BEDTIME 90 tablet 3  . diltiazem (CARDIZEM CD) 180 MG 24 hr capsule Take 1 capsule (180 mg total) by mouth daily. 90 capsule 3  . furosemide (LASIX) 40 MG tablet TAKE 1 TABLET AT BEDTIME 90 tablet 3  . gabapentin (NEURONTIN) 100 MG capsule Take 1 capsule (100 mg total) by mouth 2 (two) times daily as needed. For neuropathy 90 capsule 3  . glucose blood (ACCU-CHEK AVIVA PLUS) test strip 1 each by Other route 2 (two) times daily. Use to check blood sugars twice a day 200 each 3  . HYDROcodone-acetaminophen (NORCO/VICODIN) 5-325 MG tablet Take 0.5-1 tablets by mouth 2 (two) times daily as needed for severe pain. 60 tablet 0  . KLOR-CON M20 20 MEQ tablet TAKE 1 TABLET EVERY DAY 90 tablet 3  . Lancet Devices  (ACCU-CHEK SOFTCLIX) lancets 1 each by Other route 2 (two) times daily. Use as instructed 1 each 5  . loratadine (CLARITIN) 10 MG tablet Take 10 mg by mouth daily as needed for allergies.     Marland Kitchen meclizine (ANTIVERT) 25 MG tablet Take 1 tablet (25 mg total) by mouth 3 (three) times daily as needed for dizziness. 21 tablet 0  . memantine (NAMENDA) 5 MG tablet Take 1 tablet (5 mg total) by mouth 2 (two) times daily. 180 tablet 3  . mirabegron ER (MYRBETRIQ) 25 MG TB24 tablet Take 1 tablet (25 mg total) by mouth daily. 30 tablet 11  . ondansetron (ZOFRAN) 4 MG tablet Take 1 tablet (4 mg total) by mouth every 8 (eight) hours as needed for nausea or vomiting. 20 tablet 0  . OVER THE COUNTER MEDICATION Take 1 tablet by mouth as needed (joint pain).     . permethrin (ELIMITE) 5 % cream Apply to affected area once 60 g 0  . vitamin B-12 (CYANOCOBALAMIN) 1000 MCG tablet Take 1,000 mcg by mouth daily as needed (energy).    . warfarin (COUMADIN) 10 MG tablet Take 1 tablet daily OR as directed by anticoagulation clinic. 105 tablet 0  . warfarin (COUMADIN) 5 MG tablet TAKE AS DIRECTED 40 tablet 5  .  metFORMIN (GLUCOPHAGE) 500 MG tablet Take 1 tablet (500 mg total) by mouth daily with breakfast. (Patient not taking: Reported on 05/20/2018) 90 tablet 3   No facility-administered medications prior to visit.     ROS: Review of Systems  Constitutional: Negative for activity change, appetite change, chills, fatigue and unexpected weight change.  HENT: Negative for congestion, mouth sores and sinus pressure.   Eyes: Negative for visual disturbance.  Respiratory: Negative for cough and chest tightness.   Gastrointestinal: Negative for abdominal pain and nausea.  Genitourinary: Negative for difficulty urinating, frequency and vaginal pain.  Musculoskeletal: Negative for back pain and gait problem.  Skin: Negative for pallor and rash.  Neurological: Negative for dizziness, tremors, weakness, numbness and headaches.    Psychiatric/Behavioral: Negative for confusion, sleep disturbance and suicidal ideas. The patient is nervous/anxious.     Objective:  BP 138/76 (BP Location: Left Arm, Patient Position: Sitting, Cuff Size: Large)   Pulse 81   Temp 98.2 F (36.8 C) (Oral)   Ht 5' 2"  (1.575 m)   Wt 183 lb (83 kg)   LMP  (LMP Unknown)   SpO2 98%   BMI 33.47 kg/m   BP Readings from Last 3 Encounters:  05/20/18 138/76  02/19/18 113/60  01/30/18 (!) 111/93    Wt Readings from Last 3 Encounters:  05/20/18 183 lb (83 kg)  06/18/17 185 lb 12.8 oz (84.3 kg)  05/21/17 186 lb (84.4 kg)    Physical Exam  Constitutional: She appears well-developed. No distress.  HENT:  Head: Normocephalic.  Right Ear: External ear normal.  Left Ear: External ear normal.  Nose: Nose normal.  Mouth/Throat: Oropharynx is clear and moist.  Eyes: Pupils are equal, round, and reactive to light. Conjunctivae are normal. Right eye exhibits no discharge. Left eye exhibits no discharge.  Neck: Normal range of motion. Neck supple. No JVD present. No tracheal deviation present. No thyromegaly present.  Cardiovascular: Normal rate, regular rhythm and normal heart sounds.  Pulmonary/Chest: No stridor. No respiratory distress. She has no wheezes.  Abdominal: Soft. Bowel sounds are normal. She exhibits no distension and no mass. There is no tenderness. There is no rebound and no guarding.  Musculoskeletal: She exhibits no edema or tenderness.  Lymphadenopathy:    She has no cervical adenopathy.  Neurological: She displays normal reflexes. No cranial nerve deficit. She exhibits normal muscle tone. Coordination normal.  Skin: No rash noted. No erythema.  Psychiatric: Her behavior is normal. Judgment and thought content normal.   No rash  Wart R arm      Procedure Note :     Procedure : Cryosurgery   Indication:  Wart(s)     Risks including unsuccessful procedure , bleeding, infection, bruising, scar, a need for a repeat   procedure and others were explained to the patient in detail as well as the benefits. Informed consent was obtained verbally.     1lesion(s)  on  R arm  was/were treated with liquid nitrogen on a Q-tip in a usual fasion . Band-Aid was applied and antibiotic ointment was given for a later use.   Tolerated well. Complications none.      Lab Results  Component Value Date   WBC 6.2 02/18/2018   HGB 12.1 02/18/2018   HCT 35.6 (L) 02/18/2018   PLT 293 02/18/2018   GLUCOSE 162 (H) 02/18/2018   CHOL 113 11/07/2016   TRIG 111.0 11/07/2016   HDL 42.80 11/07/2016   LDLCALC 48 11/07/2016   ALT 14 02/18/2018  AST 23 02/18/2018   NA 139 02/18/2018   K 3.7 02/18/2018   CL 103 02/18/2018   CREATININE 1.01 (H) 02/18/2018   BUN 12 02/18/2018   CO2 28 02/18/2018   TSH 1.96 05/21/2017   INR 1.5 12/26/2017   HGBA1C 6.7 (H) 05/21/2017    No results found.  Assessment & Plan:   There are no diagnoses linked to this encounter.   No orders of the defined types were placed in this encounter.    Follow-up: No follow-ups on file.  Walker Kehr, MD

## 2018-05-20 NOTE — Assessment & Plan Note (Signed)
Labs Off meds

## 2018-05-20 NOTE — Assessment & Plan Note (Addendum)
Depo-medrol IM Hydroxyzine prn Call a bed bug exterminator [facility]

## 2018-05-21 MED ORDER — METHYLPREDNISOLONE ACETATE 80 MG/ML IJ SUSP
80.0000 mg | Freq: Once | INTRAMUSCULAR | Status: AC
  Administered 2018-05-20: 80 mg via INTRAMUSCULAR

## 2018-05-21 NOTE — Addendum Note (Signed)
Addended by: Karren Cobble on: 05/21/2018 08:01 AM   Modules accepted: Orders

## 2018-05-22 ENCOUNTER — Ambulatory Visit: Payer: Medicare HMO

## 2018-07-31 ENCOUNTER — Ambulatory Visit (INDEPENDENT_AMBULATORY_CARE_PROVIDER_SITE_OTHER): Payer: Medicare HMO | Admitting: General Practice

## 2018-07-31 DIAGNOSIS — Z9889 Other specified postprocedural states: Secondary | ICD-10-CM

## 2018-07-31 DIAGNOSIS — Z23 Encounter for immunization: Secondary | ICD-10-CM | POA: Diagnosis not present

## 2018-07-31 DIAGNOSIS — Z7901 Long term (current) use of anticoagulants: Secondary | ICD-10-CM

## 2018-07-31 DIAGNOSIS — I4891 Unspecified atrial fibrillation: Secondary | ICD-10-CM

## 2018-07-31 LAB — POCT INR: INR: 2.2 (ref 2.0–3.0)

## 2018-07-31 NOTE — Patient Instructions (Addendum)
Pre visit review using our clinic review tool, if applicable. No additional management support is needed unless otherwise documented below in the visit note.  Take 12.5 mg today and then take 12.5 mg every day except take 10 mg on Mondays and Fridays.  Re-check in 2 weeks.

## 2018-07-31 NOTE — Progress Notes (Signed)
Agree with management.  Binnie Rail, MD    Injection given.   Binnie Rail, MD

## 2018-08-18 ENCOUNTER — Ambulatory Visit (INDEPENDENT_AMBULATORY_CARE_PROVIDER_SITE_OTHER): Payer: Medicare HMO | Admitting: General Practice

## 2018-08-18 DIAGNOSIS — Z7901 Long term (current) use of anticoagulants: Secondary | ICD-10-CM | POA: Diagnosis not present

## 2018-08-18 DIAGNOSIS — Z9889 Other specified postprocedural states: Secondary | ICD-10-CM

## 2018-08-18 LAB — POCT INR: INR: 4.2 — AB (ref 2.0–3.0)

## 2018-08-18 NOTE — Patient Instructions (Addendum)
Pre visit review using our clinic review tool, if applicable. No additional management support is needed unless otherwise documented below in the visit note.  Hold coumadin today (10/22) and then change dosage and take  10 mg all days except 12.5 mg on Sunday/Tuesdays/Thursdays.  Re-check in 3 weeks.

## 2018-08-27 DIAGNOSIS — H26491 Other secondary cataract, right eye: Secondary | ICD-10-CM | POA: Diagnosis not present

## 2018-08-27 DIAGNOSIS — Z961 Presence of intraocular lens: Secondary | ICD-10-CM | POA: Diagnosis not present

## 2018-08-27 DIAGNOSIS — E119 Type 2 diabetes mellitus without complications: Secondary | ICD-10-CM | POA: Diagnosis not present

## 2018-08-28 NOTE — Progress Notes (Signed)
Patient ID: Beth Miller, female   DOB: June 09, 1941, 77 y.o.   MRN: [id number]    77 y.o. seen for f/u MVR  Also has history of chronic afib, previous smoker with COPD, DM PVD with carotid disease No documented CAD last myovue non ischemic March 2014  EF 50-55% by TTE 07/03/16.  Needs updated carotid with non obstructive plaque by US9/8/16  She seems to do better with calcium blockers rather than beta blockers for rate control of her afib  Enjoying her bowling Husband has stage 4 prostate cancer He is 107 but still working  Needs to see dentist. 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  All other systems reviewed and negative  General: Vitals:   09/07/18 0856  Weight: 183 lb (83 kg)  Height: _0  (1.575 m)     Affect appropriate Healthy:  Black female  HEENT: normal Neck supple with no adenopathy JVP normal no bruits no thyromegaly Lungs clear with no wheezing and good diaphragmatic motion Heart:  S1 click /S2 no MR  murmur, no rub, gallop or click PMI normal Abdomen: benighn, BS positve, no tenderness, no AAA no bruit.  No HSM or HJR Distal pulses intact with no bruits No edema Neuro non-focal Skin warm and dry No muscular weakness     Current Outpatient Medications  Medication Sig Dispense Refill  . ACCU-CHEK SOFTCLIX LANCETS lancets 1 each by Other route 2 (two) times daily. Use to check blood sugars twice a day 200 each 3  . acetaminophen (TYLENOL) 500 MG tablet Take 1,000 mg by mouth every 6 (six) hours as needed for mild pain.    . Alcohol Swabs (B-D SINGLE USE SWABS REGULAR) PADS 1 each by Does not apply route 2 (two) times daily. Use to clean finger to check blood sugars twice a day 200 each 3  . amoxicillin  (AMOXIL) 500 MG tablet Take four tablets one hour prior to dental procedure 12 tablet 1  . Blood Glucose Calibration (ACCU-CHEK AVIVA) SOLN 1 each by In Vitro route as directed. 3 each 1  . Blood Glucose Monitoring Suppl (ACCU-CHEK AVIVA PLUS) w/Device KIT 1 each by Does not apply route 2 (two) times daily. Use as directed 1 kit 0  . Cholecalciferol (VITAMIN D3) 1000 UNIT tablet Take 1,000 Units by mouth daily.      . digoxin (LANOXIN) 0.25 MG tablet TAKE 1 TABLET AT BEDTIME 90 tablet 3  . diltiazem (CARDIZEM CD) 180 MG 24 hr capsule Take 1 capsule (180 mg total) by mouth daily. 90 capsule 3  . furosemide (LASIX) 40 MG tablet TAKE 1 TABLET AT BEDTIME 90 tablet 3  . gabapentin (NEURONTIN) 100 MG capsule Take 1 capsule (100 mg total) by mouth 2 (two) times daily as needed. For neuropathy 90 capsule 3  . glucose blood (ACCU-CHEK AVIVA PLUS) test strip 1 each by Other route 2 (two) times daily. Use to check blood sugars twice a day 200 each 3  . Lancet Devices (ACCU-CHEK SOFTCLIX) lancets 1 each by Other route 2 (two) times daily. Use as instructed 1 each 5  . loratadine (CLARITIN) 10 MG tablet Take 10 mg by mouth daily as needed for allergies.     . memantine (NAMENDA) 5 MG tablet Take 1 tablet (5 mg total) by mouth  2 (two) times daily. 180 tablet 3  . mirabegron ER (MYRBETRIQ) 25 MG TB24 tablet Take 1 tablet (25 mg total) by mouth daily. 30 tablet 11  . ondansetron (ZOFRAN) 4 MG tablet Take 1 tablet (4 mg total) by mouth every 8 (eight) hours as needed for nausea or vomiting. 20 tablet 0  . OVER THE COUNTER MEDICATION Take 1 tablet by mouth as needed (joint pain).     . potassium chloride SA (KLOR-CON M20) 20 MEQ tablet Take 1 tablet (20 mEq total) by mouth daily. 90 tablet 3  . triamcinolone cream (KENALOG) 0.5 % Apply 1 application topically 3 (three) times daily as needed. 60 g 1  . vitamin B-12 (CYANOCOBALAMIN) 1000 MCG tablet Take 1,000 mcg by mouth daily as needed (energy).    . warfarin  (COUMADIN) 10 MG tablet Take 1 tablet daily OR as directed by anticoagulation clinic. 105 tablet 0  . HYDROcodone-acetaminophen (NORCO/VICODIN) 5-325 MG tablet Take 0.5-1 tablets by mouth 2 (two) times daily as needed for severe pain. (Patient not taking: Reported on 09/07/2018) 60 tablet 0   No current facility-administered medications for this visit.     Allergies  Aricept [donepezil hcl]; Food; Furosemide; Quinine; Spironolactone; Tramadol; and Sulfadiazine  Electrocardiogram: 06/18/17 afib rate 87 LVH with strain   Assessment and Plan Afib:  Good rate control needs lovenox bridging with afib and mechanical MVR HTN:  Well controlled.  Continue current medications and low sodium Dash type diet.   Carotid: Plaque on duplex 6/15  No bruit  F/u duplex  MVR:  Normal valve click  Echo reviewed 0/0/34 EF low normal normal MVR  DM:  Discussed low carb diet.  Target hemoglobin A1c is 6.5 or less.  Continue current medications.  F/u with me in year   Jenkins Rouge

## 2018-09-07 ENCOUNTER — Encounter: Payer: Self-pay | Admitting: Cardiovascular Disease

## 2018-09-07 ENCOUNTER — Ambulatory Visit: Payer: Medicare HMO | Admitting: Cardiovascular Disease

## 2018-09-07 VITALS — BP 118/62 | HR 98 | Ht 62.0 in | Wt 183.0 lb

## 2018-09-07 DIAGNOSIS — I10 Essential (primary) hypertension: Secondary | ICD-10-CM | POA: Diagnosis not present

## 2018-09-07 DIAGNOSIS — I4891 Unspecified atrial fibrillation: Secondary | ICD-10-CM

## 2018-09-07 DIAGNOSIS — Z9889 Other specified postprocedural states: Secondary | ICD-10-CM

## 2018-09-07 MED ORDER — POTASSIUM CHLORIDE CRYS ER 20 MEQ PO TBCR: 20.0000 meq | EXTENDED_RELEASE_TABLET | Freq: Every day | ORAL | 3 refills | Status: DC

## 2018-09-07 NOTE — Patient Instructions (Signed)
Medication Instructions:   If you need a refill on your cardiac medications before your next appointment, please call your pharmacy.   Lab work:  If you have labs (blood work) drawn today and your tests are completely normal, you will receive your results only by: Marland Kitchen MyChart Message (if you have MyChart) OR . A paper copy in the mail If you have any lab test that is abnormal or we need to change your treatment, we will call you to review the results.  Testing/Procedures: NONE today  Follow-Up: At [facility], you and your health needs are our priority.  As part of our continuing mission to provide you with exceptional heart care, we have created designated Provider Care Teams.  These Care Teams include your primary Cardiologist (physician) and Advanced Practice Providers (APPs -  Physician Assistants and Nurse Practitioners) who all work together to provide you with the care you need, when you need it. You will need a follow up appointment in 1 years.  Please call our office 2 months in advance to schedule this appointment.  You may see Jenkins Rouge, MD or one of the following Advanced Practice Providers on your designated Care Team:   Truitt Merle, NP Cecilie Kicks, NP . Kathyrn Drown, NP

## 2018-09-08 ENCOUNTER — Ambulatory Visit (INDEPENDENT_AMBULATORY_CARE_PROVIDER_SITE_OTHER): Payer: Medicare HMO | Admitting: General Practice

## 2018-09-08 DIAGNOSIS — Z7901 Long term (current) use of anticoagulants: Secondary | ICD-10-CM

## 2018-09-08 DIAGNOSIS — Z9889 Other specified postprocedural states: Secondary | ICD-10-CM

## 2018-09-08 DIAGNOSIS — I4891 Unspecified atrial fibrillation: Secondary | ICD-10-CM

## 2018-09-08 LAB — POCT INR: INR: 2.3 (ref 2.0–3.0)

## 2018-09-08 NOTE — Patient Instructions (Addendum)
Pre visit review using our clinic review tool, if applicable. No additional management support is needed unless otherwise documented below in the visit note.  Take 15 mg today (11/12) and then continue to take 10 mg all days except 12.5 mg on Sunday/Tuesdays/Thursdays.  Re-check in 4 weeks.

## 2018-09-09 ENCOUNTER — Other Ambulatory Visit (INDEPENDENT_AMBULATORY_CARE_PROVIDER_SITE_OTHER): Payer: Medicare HMO

## 2018-09-09 ENCOUNTER — Encounter: Payer: Self-pay | Admitting: Gastroenterology

## 2018-09-09 ENCOUNTER — Ambulatory Visit: Payer: Medicare HMO | Admitting: Gastroenterology

## 2018-09-09 VITALS — BP 124/78 | HR 96 | Ht 61.5 in | Wt 186.0 lb

## 2018-09-09 DIAGNOSIS — R112 Nausea with vomiting, unspecified: Secondary | ICD-10-CM | POA: Diagnosis not present

## 2018-09-09 DIAGNOSIS — E119 Type 2 diabetes mellitus without complications: Secondary | ICD-10-CM | POA: Diagnosis not present

## 2018-09-09 DIAGNOSIS — R1084 Generalized abdominal pain: Secondary | ICD-10-CM | POA: Diagnosis not present

## 2018-09-09 DIAGNOSIS — B079 Viral wart, unspecified: Secondary | ICD-10-CM

## 2018-09-09 DIAGNOSIS — R194 Change in bowel habit: Secondary | ICD-10-CM

## 2018-09-09 DIAGNOSIS — L7682 Other postprocedural complications of skin and subcutaneous tissue: Secondary | ICD-10-CM | POA: Diagnosis not present

## 2018-09-09 DIAGNOSIS — Z86010 Personal history of colonic polyps: Secondary | ICD-10-CM | POA: Diagnosis not present

## 2018-09-09 DIAGNOSIS — L509 Urticaria, unspecified: Secondary | ICD-10-CM

## 2018-09-09 LAB — HEPATIC FUNCTION PANEL
ALT: 10 U/L (ref 0–35)
AST: 14 U/L (ref 0–37)
Albumin: 4.1 g/dL (ref 3.5–5.2)
Alkaline Phosphatase: 71 U/L (ref 39–117)
BILIRUBIN DIRECT: 0.2 mg/dL (ref 0.0–0.3)
TOTAL PROTEIN: 7.6 g/dL (ref 6.0–8.3)
Total Bilirubin: 0.5 mg/dL (ref 0.2–1.2)

## 2018-09-09 LAB — CBC WITH DIFFERENTIAL/PLATELET
BASOS ABS: 0.1 10*3/uL (ref 0.0–0.1)
BASOS PCT: 0.9 % (ref 0.0–3.0)
EOS ABS: 0.1 10*3/uL (ref 0.0–0.7)
Eosinophils Relative: 1.3 % (ref 0.0–5.0)
HEMATOCRIT: 34.9 % — AB (ref 36.0–46.0)
Hemoglobin: 11.7 g/dL — ABNORMAL LOW (ref 12.0–15.0)
Lymphocytes Relative: 20.4 % (ref 12.0–46.0)
Lymphs Abs: 1.6 10*3/uL (ref 0.7–4.0)
MCHC: 33.4 g/dL (ref 30.0–36.0)
MCV: 83.4 fl (ref 78.0–100.0)
MONO ABS: 0.9 10*3/uL (ref 0.1–1.0)
Monocytes Relative: 12.2 % — ABNORMAL HIGH (ref 3.0–12.0)
NEUTROS ABS: 5 10*3/uL (ref 1.4–7.7)
Neutrophils Relative %: 65.2 % (ref 43.0–77.0)
Platelets: 290 10*3/uL (ref 150.0–400.0)
RBC: 4.19 Mil/uL (ref 3.87–5.11)
RDW: 16 % — ABNORMAL HIGH (ref 11.5–15.5)
WBC: 7.7 10*3/uL (ref 4.0–10.5)

## 2018-09-09 LAB — BASIC METABOLIC PANEL
BUN: 15 mg/dL (ref 6–23)
BUN: 15 mg/dL (ref 6–23)
CALCIUM: 9.8 mg/dL (ref 8.4–10.5)
CALCIUM: 9.8 mg/dL (ref 8.4–10.5)
CHLORIDE: 104 meq/L (ref 96–112)
CO2: 32 meq/L (ref 19–32)
CO2: 33 mEq/L — ABNORMAL HIGH (ref 19–32)
CREATININE: 1.08 mg/dL (ref 0.40–1.20)
CREATININE: 1.08 mg/dL (ref 0.40–1.20)
Chloride: 104 mEq/L (ref 96–112)
GFR: 62.92 mL/min (ref >60.00)
GFR: 62.92 mL/min (ref >60.00)
Glucose, Bld: 130 mg/dL — ABNORMAL HIGH (ref 70–99)
Glucose, Bld: 131 mg/dL — ABNORMAL HIGH (ref 70–99)
Potassium: 4.1 mEq/L (ref 3.5–5.1)
Potassium: 4.1 mEq/L (ref 3.5–5.1)
Sodium: 142 mEq/L (ref 135–145)
Sodium: 142 mEq/L (ref 135–145)

## 2018-09-09 LAB — HEMOGLOBIN A1C: HEMOGLOBIN A1C: 6.7 % — AB (ref 4.6–6.5)

## 2018-09-09 MED ORDER — PANTOPRAZOLE SODIUM 40 MG PO TBEC: 40.0000 mg | DELAYED_RELEASE_TABLET | Freq: Every day | ORAL | 3 refills | Status: DC

## 2018-09-09 NOTE — Patient Instructions (Signed)
Your provider has requested that you go to the basement level for lab work before leaving today. Press "B" on the elevator. The lab is located at the first door on the left as you exit the elevator.  We have sent the following medications to your pharmacy for you to pick up at your convenience: Pantoprazole 40 mg twice a day  You have been scheduled for a CT scan of the abdomen and pelvis at [facility] ([address]---this is in the same building as [facility]).   You are scheduled on 09-22-18 at 4:00 pm. You should arrive 15 minutes prior to your appointment time for registration. Please follow the written instructions below on the day of your exam:  WARNING: IF YOU ARE ALLERGIC TO IODINE/X-RAY DYE, PLEASE NOTIFY RADIOLOGY IMMEDIATELY AT [phone]! YOU WILL BE GIVEN A 13 HOUR PREMEDICATION PREP.  1) Do not eat or drink anything after 12:00 pm (4 hours prior to your test) 2) You have been given 2 bottles of oral contrast to drink. The solution may taste better if refrigerated, but do NOT add ice or any other liquid to this solution. Shake well before drinking.    Drink 1 bottle of contrast @ 2:00 pm (2 hours prior to your exam)  Drink 1 bottle of contrast @ 3:00 pm (1 hour prior to your exam)  You may take any medications as prescribed with a small amount of water, if necessary. If you take any of the following medications: METFORMIN, GLUCOPHAGE, GLUCOVANCE, AVANDAMET, RIOMET, FORTAMET, ACTOPLUS MET, JANUMET, GLUMETZA or METAGLIP, you MAY be asked to HOLD this medication 48 hours AFTER the exam.  The purpose of you drinking the oral contrast is to aid in the visualization of your intestinal tract. The contrast solution may cause some diarrhea. Depending on your individual set of symptoms, you may also receive an intravenous injection of x-ray contrast/dye. Plan on being at [facility] for 30 minutes or longer, depending on the type of exam you are having  performed.  This test typically takes 30-45 minutes to complete.  If you have any questions regarding your exam or if you need to reschedule, you may call the CT department at [phone] between the hours of 8:00 am and 5:00 pm, Monday-Friday.  ________________________________________________________________________   

## 2018-09-09 NOTE — Progress Notes (Addendum)
Referring Provider: Cassandria Anger, MD Primary Care Physician:  Cassandria Anger, MD   Reason for Consultation:  Abdominal pain   IMPRESSION:  Abdominal pain x 1 day Change in bowel habits Nausea and intermittent vomiting Focal tenderness at the site of abdominal wall scar - patient concerned about mass Occasional NSAIDs Recent change in bowel habits, now resolved Chronic anticoagulation with warfarin History of colon polyp    -Tubular adenoma removed 09/09/2012 with Dr. Olevia Perches    - surveillance not recommended at the time due to age Left sided diverticulosis  Differential for symptoms is broad and includes NSAID-related PUD, reflux esophagitis, gastritis, malignancy, and less likely H. Pylori. She would like to avoid endoscopy given her high risk when off anticoagulation.   PLAN: Trial of pantoprazole 40 mg BID x 8 weeks CT abd/pelvis with contrast (attention to abdominal wall as well as visceral organs) Return to this clinic in 2-4 weeks EGD/Colonoscopy if not improving   HPI: Beth Miller is a 77 y.o. female.  The history is obtained from the patient review of her electronic health record.  History of mitral valve replacement in 1995 for rheumatic heart disease with a St. Jude's valve and a history of atrial fibrillation on chronic anticoagulation. Previous smoker with COPD, DM, and PVD with carotid disease.   She feels her history began in 1995 when she had a cholecystectomy and MVR. Okay until this year. She is now experiencing chronic nausea with rare vomiting. Associated halitosis. "Doesn't feel like my stomach." Diffuse, but primarily midabdominal pain occurred one day last week. No pain since that time. Last year she noticed a knot where the gallbladder was removed that is tender when pushed. That knot persists.   New constipation x 6-8 months. 2-3 days of small ball shaped stools. Some alternating diarrhea. Then a return to constipation. No blood or mucous.  Bowel habits have now returned to normal over the last 2-3 months. One formed, soft BM daily since that time.    Weight fluctuates. She has gained 5 pounds since her last medical appointment.   Trial of antiacid and antigas generic formula from [employer] on one occassion provided relief. She has not needed it again since then.   Rare ibuprofen for hip pain. Denies the use of other NSAIDs.  She is worried about the possibility of an ulcer or cancer.   No other associated symptoms. No identified exacerbating or relieving features.   History of colon polyps previously seen by Dr. Olevia Perches. She had a colonoscopy with Dr. Olevia Perches 09/09/2012.  At that time she had a change in bowel habits, diarrhea, weight loss, and was on Lovenox bridge for MVR.  A right colon lipoma, moderate left-sided diverticulosis and a tubular adenoma was removed from the sigmoid colon.  Past Medical History:  Diagnosis Date  . AF (atrial fibrillation) ([place])   . ALLERGIC RHINITIS   . Blood transfusion without reported diagnosis   . Cataract, senile   . Chest pain   . CHF (congestive heart failure) ([place])   . Chronic anticoagulation   . Diabetes mellitus   . Diabetes mellitus   . Disorder of oral soft tissue    of mouth  . Diverticulosis   . GERD (gastroesophageal reflux disease)   . Hematoma   . Hypertension   . Leg pain   . Low back pain   . Paresthesia   . Rheumatic fever   . Status post mitral valve replacement    St. Jude  valve  . Stroke ([place])    tia  . Sweating   . Vitamin D deficiency     Past Surgical History:  Procedure Laterality Date  . ABDOMINAL HYSTERECTOMY    . CARDIAC VALVE REPLACEMENT  1995   Mitral valve prosthesis; st jude  . CHOLECYSTECTOMY      Current Outpatient Medications  Medication Sig Dispense Refill  . ACCU-CHEK SOFTCLIX LANCETS lancets 1 each by Other route 2 (two) times daily. Use to check blood sugars twice a day 200 each 3  . acetaminophen (TYLENOL) 500 MG tablet Take  1,000 mg by mouth every 6 (six) hours as needed for mild pain.    . Alcohol Swabs (B-D SINGLE USE SWABS REGULAR) PADS 1 each by Does not apply route 2 (two) times daily. Use to clean finger to check blood sugars twice a day 200 each 3  . amoxicillin (AMOXIL) 500 MG tablet Take four tablets one hour prior to dental procedure 12 tablet 1  . Blood Glucose Calibration (ACCU-CHEK AVIVA) SOLN 1 each by In Vitro route as directed. 3 each 1  . Blood Glucose Monitoring Suppl (ACCU-CHEK AVIVA PLUS) w/Device KIT 1 each by Does not apply route 2 (two) times daily. Use as directed 1 kit 0  . Cholecalciferol (VITAMIN D3) 1000 UNIT tablet Take 1,000 Units by mouth daily.      . digoxin (LANOXIN) 0.25 MG tablet TAKE 1 TABLET AT BEDTIME 90 tablet 3  . diltiazem (CARDIZEM CD) 180 MG 24 hr capsule Take 1 capsule (180 mg total) by mouth daily. 90 capsule 3  . furosemide (LASIX) 40 MG tablet TAKE 1 TABLET AT BEDTIME 90 tablet 3  . gabapentin (NEURONTIN) 100 MG capsule Take 1 capsule (100 mg total) by mouth 2 (two) times daily as needed. For neuropathy 90 capsule 3  . glucose blood (ACCU-CHEK AVIVA PLUS) test strip 1 each by Other route 2 (two) times daily. Use to check blood sugars twice a day 200 each 3  . HYDROcodone-acetaminophen (NORCO/VICODIN) 5-325 MG tablet Take 0.5-1 tablets by mouth 2 (two) times daily as needed for severe pain. (Patient not taking: Reported on 09/07/2018) 60 tablet 0  . Lancet Devices (ACCU-CHEK SOFTCLIX) lancets 1 each by Other route 2 (two) times daily. Use as instructed 1 each 5  . loratadine (CLARITIN) 10 MG tablet Take 10 mg by mouth daily as needed for allergies.     . memantine (NAMENDA) 5 MG tablet Take 1 tablet (5 mg total) by mouth 2 (two) times daily. 180 tablet 3  . mirabegron ER (MYRBETRIQ) 25 MG TB24 tablet Take 1 tablet (25 mg total) by mouth daily. 30 tablet 11  . ondansetron (ZOFRAN) 4 MG tablet Take 1 tablet (4 mg total) by mouth every 8 (eight) hours as needed for nausea or  vomiting. 20 tablet 0  . OVER THE COUNTER MEDICATION Take 1 tablet by mouth as needed (joint pain).     . potassium chloride SA (KLOR-CON M20) 20 MEQ tablet Take 1 tablet (20 mEq total) by mouth daily. 90 tablet 3  . triamcinolone cream (KENALOG) 0.5 % Apply 1 application topically 3 (three) times daily as needed. 60 g 1  . vitamin B-12 (CYANOCOBALAMIN) 1000 MCG tablet Take 1,000 mcg by mouth daily as needed (energy).    . warfarin (COUMADIN) 10 MG tablet Take 1 tablet daily OR as directed by anticoagulation clinic. 105 tablet 0   No current facility-administered medications for this visit.     Allergies as of  09/09/2018 - Review Complete 09/07/2018  Allergen Reaction Noted  . Aricept [donepezil hcl]  03/12/2017  . Food  02/16/2012  . Furosemide  07/20/2013  . Quinine Other (See Comments) 02/10/2009  . Spironolactone  01/13/2013  . Tramadol  02/16/2016  . Sulfadiazine Itching and Rash     Family History  Problem Relation Age of Onset  . Diabetes Mother   . Seizures Mother   . Other Mother        brain tumor  . Heart disease Brother   . Diabetes Brother   . Heart disease Brother   . Pancreatic cancer Brother   . Colon cancer Neg Hx     Social History   Socioeconomic History  . Marital status: Married    Spouse name: Not on file  . Number of children: 1  . Years of education: Not on file  . Highest education level: Not on file  Occupational History  . Occupation: Retired    Fish farm manager: RETIRED  Social Needs  . Financial resource strain: Not on file  . Food insecurity:    Worry: Not on file    Inability: Not on file  . Transportation needs:    Medical: Not on file    Non-medical: Not on file  Tobacco Use  . Smoking status: Former Smoker    Packs/day: 0.02    Years: 60.00    Pack years: 1.20    Types: Cigarettes    Last attempt to quit: 10/28/2002    Years since quitting: 15.8  . Smokeless tobacco: Never Used  . Tobacco comment: quit x 13 years   Substance and  Sexual Activity  . Alcohol use: No    Alcohol/week: 0.0 standard drinks  . Drug use: No  . Sexual activity: Yes  Lifestyle  . Physical activity:    Days per week: Not on file    Minutes per session: Not on file  . Stress: Not on file  Relationships  . Social connections:    Talks on phone: Not on file    Gets together: Not on file    Attends religious service: Not on file    Active member of club or organization: Not on file    Attends meetings of clubs or organizations: Not on file    Relationship status: Not on file  . Intimate partner violence:    Fear of current or ex partner: Not on file    Emotionally abused: Not on file    Physically abused: Not on file    Forced sexual activity: Not on file  Other Topics Concern  . Not on file  Social History Narrative   Negative Family History of Colon Cancer      Regular exercise-yes, bowling      Daily caffeine Use-rare    Review of Systems: 12 system ROS is negative except as noted above.  @weight @ Physical Exam: Vital signs were reviewed. General:   Alert, well-nourished, pleasant and cooperative in NAD Head:  Normocephalic and atraumatic. Eyes:  Sclera clear, no icterus.   Conjunctiva pink. Mouth:  No deformity or lesions.   Neck:  Supple; no thyromegaly. Lungs:  Clear throughout to auscultation.   No wheezes.  Heart:  Regular rate and rhythm; no murmurs Abdomen:  Soft, normal bowel sounds. Well healed surgical scars. Tenderness at the superior margin of the midline scar with some associated firmness. No rebound or guarding. No hepatosplenomegaly Rectal:  Deferred  Msk:  Symmetrical without gross deformities. Extremities:  No  gross deformities or edema. Neurologic:  Alert and  oriented x4;  grossly nonfocal Skin:  No rash or bruise. Psych:  Alert and cooperative. Normal mood and affect.   Beth Miller L. Tarri Glenn Md, MPH [facility] 09/09/2018, 8:34 AM

## 2018-09-21 ENCOUNTER — Other Ambulatory Visit: Payer: Self-pay | Admitting: Internal Medicine

## 2018-09-22 ENCOUNTER — Telehealth: Payer: Self-pay | Admitting: Gastroenterology

## 2018-09-22 ENCOUNTER — Ambulatory Visit (INDEPENDENT_AMBULATORY_CARE_PROVIDER_SITE_OTHER)
Admission: RE | Admit: 2018-09-22 | Discharge: 2018-09-22 | Disposition: A | Discharge status: Home / Self Care | Payer: Medicare HMO | Source: Ambulatory Visit | Attending: Gastroenterology | Admitting: Gastroenterology

## 2018-09-22 ENCOUNTER — Other Ambulatory Visit: Payer: Self-pay | Admitting: Internal Medicine

## 2018-09-22 DIAGNOSIS — R1084 Generalized abdominal pain: Secondary | ICD-10-CM

## 2018-09-22 DIAGNOSIS — R112 Nausea with vomiting, unspecified: Secondary | ICD-10-CM | POA: Diagnosis not present

## 2018-09-22 MED ORDER — IOPAMIDOL (ISOVUE-300) INJECTION 61%
100.0000 mL | Freq: Once | INTRAVENOUS | Status: AC | PRN
  Administered 2018-09-22: 100 mL via INTRAVENOUS

## 2018-09-22 NOTE — Telephone Encounter (Signed)
The pt was advised by Marzetta Board at CT that she can keep the appt as planned.

## 2018-09-22 NOTE — Telephone Encounter (Signed)
Patient states she has a CT scan today 11.26.19 @4PM  and accidentally ate something about 45 minutes ago. Patient states she is suppose to start drinking the contrast at 2pm and wants to know if she needs to reschedule.

## 2018-10-06 ENCOUNTER — Ambulatory Visit (INDEPENDENT_AMBULATORY_CARE_PROVIDER_SITE_OTHER): Payer: Medicare HMO | Admitting: General Practice

## 2018-10-06 DIAGNOSIS — I4891 Unspecified atrial fibrillation: Secondary | ICD-10-CM

## 2018-10-06 DIAGNOSIS — Z7901 Long term (current) use of anticoagulants: Secondary | ICD-10-CM | POA: Diagnosis not present

## 2018-10-06 DIAGNOSIS — Z9889 Other specified postprocedural states: Secondary | ICD-10-CM | POA: Diagnosis not present

## 2018-10-06 LAB — POCT INR: INR: 1.9 — AB (ref 2.0–3.0)

## 2018-10-06 NOTE — Patient Instructions (Addendum)
Pre visit review using our clinic review tool, if applicable. No additional management support is needed unless otherwise documented below in the visit note.  Take 15 mg today (12/10) and take 12.5 mg tomorrow (12/11) then continue to take 10 mg all days except 12.5 mg on Sunday/Tuesdays/Thursdays.  Re-check in 4 weeks.

## 2018-11-03 ENCOUNTER — Ambulatory Visit (INDEPENDENT_AMBULATORY_CARE_PROVIDER_SITE_OTHER): Payer: Medicare HMO | Admitting: General Practice

## 2018-11-03 DIAGNOSIS — Z7901 Long term (current) use of anticoagulants: Secondary | ICD-10-CM

## 2018-11-03 DIAGNOSIS — I4891 Unspecified atrial fibrillation: Secondary | ICD-10-CM

## 2018-11-03 DIAGNOSIS — Z9889 Other specified postprocedural states: Secondary | ICD-10-CM

## 2018-11-03 LAB — POCT INR: INR: 2.2 (ref 2.0–3.0)

## 2018-11-03 NOTE — Patient Instructions (Addendum)
Pre visit review using our clinic review tool, if applicable. No additional management support is needed unless otherwise documented below in the visit note.  Take 15 mg today (1/7) and change dosage and take 12.5 mg every day except take 10 mg on Monday/Wed/Friday. Re-check in 4 weeks.

## 2018-11-04 ENCOUNTER — Encounter (INDEPENDENT_AMBULATORY_CARE_PROVIDER_SITE_OTHER): Payer: Self-pay

## 2018-11-04 ENCOUNTER — Ambulatory Visit: Payer: Medicare HMO | Admitting: Gastroenterology

## 2018-11-04 ENCOUNTER — Encounter: Payer: Self-pay | Admitting: Gastroenterology

## 2018-11-04 VITALS — BP 120/68 | HR 68 | Ht 62.0 in | Wt 184.0 lb

## 2018-11-04 DIAGNOSIS — R1084 Generalized abdominal pain: Secondary | ICD-10-CM

## 2018-11-04 DIAGNOSIS — R194 Change in bowel habit: Secondary | ICD-10-CM | POA: Diagnosis not present

## 2018-11-04 NOTE — Progress Notes (Signed)
Referring Provider: Cassandria Anger, MD Primary Care Physician:  Cassandria Anger, MD   Reason for Consultation:  Abdominal pain   IMPRESSION:  Recent acute change in bowel habits with nausea and intermittent vomiting, now resolved Focal tenderness at the site of abdominal wall scar, imprpved    - patient concerned about mass    - CT abd/pelvis with contrast 09/22/18: no acute abnormality Occasional NSAIDs Chronic anticoagulation with warfarin History of colon polyp    -Tubular adenoma removed 09/09/2012 with Dr. Olevia Perches    - surveillance not recommended at the time due to age Left sided diverticulosis  Symptoms have resolved with empiric proton pump inhibitor therapy and avoiding NSAIDs. She has no GI symptoms or concerns at this time. She continues to want to avoid endoscopy given her high risk when off anticoagulation.   PLAN: Complete 8 weeks of pantoprazole 40 mg BID Consider EGD/Colonoscopy in the future if symptoms return Return to this clinic as needed, she declined scheduled follow-up at this time   HPI: Beth Miller is a 78 y.o. female who returns in scheduled follow-up after her initial consultation 09/09/2018 for abdominal pain, nausea and vomiting.  The interval history is obtained from the patient review of her electronic health record.  History of mitral valve replacement in 1995 for rheumatic heart disease with a St. Jude's valve and a history of atrial fibrillation on chronic anticoagulation. Previous smoker with COPD, DM, and PVD with carotid disease. She has a history of colon polyps, but, surveillance was not recommended after her last exam given her advanced age.   A CT of the abdomen and pelvis with contrast 09/23/2018 showed evidence for her prior cholecystectomy, a normal pancreas and pancreatic duct, normal hepatobiliary tree.  There were no abdominal findings to explain her symptoms.  The radiologist noted a large stable sclerotic density in the  right iliac bone  She is overall feeling well and notes no ongoing GI symptoms. The "knot" at her abdominal wall scar has improved and is no longer worrying her. She feels that the constipation is better and her stools are not as hard. She is no longer having small stools. Eating more greens and notes this has bulked up her stools.  She feels that her symptoms are well controlled and that no further evaluation or treatment is needed.    Past Medical History:  Diagnosis Date  . AF (atrial fibrillation) ([place])   . ALLERGIC RHINITIS   . Blood transfusion without reported diagnosis   . Cataract, senile   . Chest pain   . CHF (congestive heart failure) ([place])   . Chronic anticoagulation   . Diabetes mellitus   . Diabetes mellitus   . Disorder of oral soft tissue    of mouth  . Diverticulosis   . GERD (gastroesophageal reflux disease)   . Hematoma   . Hypertension   . Leg pain   . Low back pain   . Paresthesia   . Rheumatic fever   . Status post mitral valve replacement    St. Jude valve  . Stroke ([place])    tia  . Sweating   . Vitamin D deficiency     Past Surgical History:  Procedure Laterality Date  . ABDOMINAL HYSTERECTOMY    . CARDIAC VALVE REPLACEMENT  1995   Mitral valve prosthesis; st jude  . CHOLECYSTECTOMY      Current Outpatient Medications  Medication Sig Dispense Refill  . ACCU-CHEK SOFTCLIX LANCETS lancets 1 each  by Other route 2 (two) times daily. Use to check blood sugars twice a day 200 each 3  . acetaminophen (TYLENOL) 500 MG tablet Take 1,000 mg by mouth every 6 (six) hours as needed for mild pain.    . Alcohol Swabs (B-D SINGLE USE SWABS REGULAR) PADS 1 each by Does not apply route 2 (two) times daily. Use to clean finger to check blood sugars twice a day 200 each 3  . amoxicillin (AMOXIL) 500 MG tablet Take four tablets one hour prior to dental procedure (Patient not taking: Reported on 09/09/2018) 12 tablet 1  . Blood Glucose Calibration (ACCU-CHEK AVIVA)  SOLN 1 each by In Vitro route as directed. 3 each 1  . Blood Glucose Monitoring Suppl (ACCU-CHEK AVIVA PLUS) w/Device KIT 1 each by Does not apply route 2 (two) times daily. Use as directed 1 kit 0  . Cholecalciferol (VITAMIN D3) 1000 UNIT tablet Take 1,000 Units by mouth daily.      . digoxin (LANOXIN) 0.25 MG tablet TAKE 1 TABLET AT BEDTIME 90 tablet 3  . diltiazem (CARDIZEM CD) 180 MG 24 hr capsule TAKE 1 CAPSULE (180 MG TOTAL) BY MOUTH DAILY. 90 capsule 3  . furosemide (LASIX) 40 MG tablet TAKE 1 TABLET AT BEDTIME 90 tablet 3  . gabapentin (NEURONTIN) 100 MG capsule Take 1 capsule (100 mg total) by mouth 2 (two) times daily as needed. For neuropathy 90 capsule 3  . glucose blood (ACCU-CHEK AVIVA PLUS) test strip 1 each by Other route 2 (two) times daily. Use to check blood sugars twice a day 200 each 3  . HYDROcodone-acetaminophen (NORCO/VICODIN) 5-325 MG tablet Take 0.5-1 tablets by mouth 2 (two) times daily as needed for severe pain. 60 tablet 0  . Lancet Devices (ACCU-CHEK SOFTCLIX) lancets 1 each by Other route 2 (two) times daily. Use as instructed 1 each 5  . loratadine (CLARITIN) 10 MG tablet Take 10 mg by mouth daily as needed for allergies.     . memantine (NAMENDA) 5 MG tablet Take 1 tablet (5 mg total) by mouth 2 (two) times daily. 180 tablet 3  . mirabegron ER (MYRBETRIQ) 25 MG TB24 tablet Take 1 tablet (25 mg total) by mouth daily. 30 tablet 11  . ondansetron (ZOFRAN) 4 MG tablet Take 1 tablet (4 mg total) by mouth every 8 (eight) hours as needed for nausea or vomiting. 20 tablet 0  . OVER THE COUNTER MEDICATION Take 1 tablet by mouth as needed (joint pain).     . pantoprazole (PROTONIX) 40 MG tablet Take 1 tablet (40 mg total) by mouth daily. 90 tablet 3  . potassium chloride SA (KLOR-CON M20) 20 MEQ tablet Take 1 tablet (20 mEq total) by mouth daily. 90 tablet 3  . triamcinolone cream (KENALOG) 0.5 % Apply 1 application topically 3 (three) times daily as needed. 60 g 1  . vitamin  B-12 (CYANOCOBALAMIN) 1000 MCG tablet Take 1,000 mcg by mouth daily as needed (energy).    . warfarin (COUMADIN) 10 MG tablet Take 1 tablet daily OR as directed by anticoagulation clinic. 90 day 90 tablet 0   No current facility-administered medications for this visit.     Allergies as of 11/04/2018 - Review Complete 09/22/2018  Allergen Reaction Noted  . Aricept [donepezil hcl]  03/12/2017  . Food  02/16/2012  . Furosemide  07/20/2013  . Quinine Other (See Comments) 02/10/2009  . Spironolactone  01/13/2013  . Tramadol  02/16/2016  . Sulfadiazine Itching and Rash  Family History  Problem Relation Age of Onset  . Diabetes Mother   . Seizures Mother   . Other Mother        brain tumor  . Heart disease Brother   . Diabetes Brother   . Heart disease Brother   . Pancreatic cancer Brother   . Colon cancer Neg Hx     Social History   Socioeconomic History  . Marital status: Married    Spouse name: Not on file  . Number of children: 1  . Years of education: Not on file  . Highest education level: Not on file  Occupational History  . Occupation: Retired    Fish farm manager: RETIRED  Social Needs  . Financial resource strain: Not on file  . Food insecurity:    Worry: Not on file    Inability: Not on file  . Transportation needs:    Medical: Not on file    Non-medical: Not on file  Tobacco Use  . Smoking status: Former Smoker    Packs/day: 0.02    Years: 60.00    Pack years: 1.20    Types: Cigarettes    Last attempt to quit: 10/28/2002    Years since quitting: 16.0  . Smokeless tobacco: Never Used  . Tobacco comment: quit x 13 years   Substance and Sexual Activity  . Alcohol use: No    Alcohol/week: 0.0 standard drinks  . Drug use: No  . Sexual activity: Yes  Lifestyle  . Physical activity:    Days per week: Not on file    Minutes per session: Not on file  . Stress: Not on file  Relationships  . Social connections:    Talks on phone: Not on file    Gets together:  Not on file    Attends religious service: Not on file    Active member of club or organization: Not on file    Attends meetings of clubs or organizations: Not on file    Relationship status: Not on file  . Intimate partner violence:    Fear of current or ex partner: Not on file    Emotionally abused: Not on file    Physically abused: Not on file    Forced sexual activity: Not on file  Other Topics Concern  . Not on file  Social History Narrative   Negative Family History of Colon Cancer      Regular exercise-yes, bowling      Daily caffeine Use-rare    Review of Systems: 12 system ROS is negative except as noted above.  @weight @ Physical Exam: Vital signs were reviewed. General:   Alert, well-nourished, pleasant and cooperative in NAD Head:  Normocephalic and atraumatic. Eyes:  Sclera clear, no icterus.   Conjunctiva pink. Mouth:  No deformity or lesions.   Neck:  Supple; no thyromegaly. Lungs:  Clear throughout to auscultation.   No wheezes.  Heart:  Regular rate and rhythm; no murmurs Abdomen:  Soft, normal bowel sounds. Well healed surgical scars. Tenderness at the superior margin of the midline scar with some associated firmness. No rebound or guarding. No hepatosplenomegaly Rectal:  Deferred  Msk:  Symmetrical without gross deformities. Extremities:  No gross deformities or edema. Neurologic:  Alert and  oriented x4;  grossly nonfocal Skin:  No rash or bruise. Psych:  Alert and cooperative. Normal mood and affect.   Azyiah Bo L. Tarri Glenn Md, MPH [facility] 11/04/2018, 9:06 AM

## 2018-12-01 ENCOUNTER — Ambulatory Visit: Payer: Medicare HMO

## 2018-12-04 ENCOUNTER — Ambulatory Visit (INDEPENDENT_AMBULATORY_CARE_PROVIDER_SITE_OTHER): Payer: Medicare HMO | Admitting: General Practice

## 2018-12-04 DIAGNOSIS — Z9889 Other specified postprocedural states: Secondary | ICD-10-CM

## 2018-12-04 DIAGNOSIS — Z7901 Long term (current) use of anticoagulants: Secondary | ICD-10-CM | POA: Diagnosis not present

## 2018-12-04 LAB — POCT INR: INR: 1.9 — AB (ref 2.0–3.0)

## 2018-12-04 NOTE — Patient Instructions (Addendum)
Pre visit review using our clinic review tool, if applicable. No additional management support is needed unless otherwise documented below in the visit note.  Take 15 mg today and tomorrow (2/7 and 2/8) and then Sunday start 12.5 mg daily.  Re-check in 2 weeks.

## 2018-12-16 ENCOUNTER — Other Ambulatory Visit: Payer: Self-pay | Admitting: Internal Medicine

## 2018-12-18 ENCOUNTER — Ambulatory Visit (INDEPENDENT_AMBULATORY_CARE_PROVIDER_SITE_OTHER): Payer: Medicare HMO | Admitting: General Practice

## 2018-12-18 ENCOUNTER — Ambulatory Visit: Payer: Medicare HMO

## 2018-12-18 ENCOUNTER — Other Ambulatory Visit: Payer: Self-pay

## 2018-12-18 DIAGNOSIS — I4891 Unspecified atrial fibrillation: Secondary | ICD-10-CM

## 2018-12-18 DIAGNOSIS — Z7901 Long term (current) use of anticoagulants: Secondary | ICD-10-CM

## 2018-12-18 DIAGNOSIS — Z9889 Other specified postprocedural states: Secondary | ICD-10-CM

## 2018-12-18 LAB — POCT INR: INR: 3.6 — AB (ref 2.0–3.0)

## 2018-12-18 MED ORDER — MIRABEGRON ER 25 MG PO TB24: 25.0000 mg | ORAL_TABLET | Freq: Every day | ORAL | 1 refills | Status: DC

## 2018-12-18 MED ORDER — MEMANTINE HCL 5 MG PO TABS: 5.0000 mg | ORAL_TABLET | Freq: Two times a day (BID) | ORAL | 1 refills | Status: DC

## 2018-12-18 NOTE — Patient Instructions (Addendum)
Pre visit review using our clinic review tool, if applicable. No additional management support is needed unless otherwise documented below in the visit note.  Take 5 mg today then continue to take  12.5 mg daily.  Re-check in 4 weeks.

## 2019-01-15 ENCOUNTER — Ambulatory Visit (INDEPENDENT_AMBULATORY_CARE_PROVIDER_SITE_OTHER): Payer: Medicare HMO | Admitting: General Practice

## 2019-01-15 ENCOUNTER — Other Ambulatory Visit: Payer: Self-pay

## 2019-01-15 DIAGNOSIS — Z7901 Long term (current) use of anticoagulants: Secondary | ICD-10-CM

## 2019-01-15 DIAGNOSIS — Z9889 Other specified postprocedural states: Secondary | ICD-10-CM

## 2019-01-15 DIAGNOSIS — I4891 Unspecified atrial fibrillation: Secondary | ICD-10-CM

## 2019-01-15 LAB — POCT INR: INR: 2.1 (ref 2.0–3.0)

## 2019-01-15 NOTE — Patient Instructions (Signed)
Pre visit review using our clinic review tool, if applicable. No additional management support is needed unless otherwise documented below in the visit note.  Take 15 mg today (3/20) and then continue to take  12.5 mg daily.  Re-check in 4 weeks.

## 2019-02-09 ENCOUNTER — Ambulatory Visit (INDEPENDENT_AMBULATORY_CARE_PROVIDER_SITE_OTHER): Payer: Medicare HMO | Admitting: General Practice

## 2019-02-09 DIAGNOSIS — Z9889 Other specified postprocedural states: Secondary | ICD-10-CM

## 2019-02-09 DIAGNOSIS — Z7901 Long term (current) use of anticoagulants: Secondary | ICD-10-CM | POA: Diagnosis not present

## 2019-02-09 DIAGNOSIS — I4891 Unspecified atrial fibrillation: Secondary | ICD-10-CM

## 2019-02-09 LAB — POCT INR: INR: 2 (ref 2.0–3.0)

## 2019-02-09 NOTE — Patient Instructions (Addendum)
Pre visit review using our clinic review tool, if applicable. No additional management support is needed unless otherwise documented below in the visit note.  Take 17.5 mg  today (4/14) and tomorrow (4/15) and then change dosage and take 12.5 mg daily except 17.5 mg on Mondays and Fridays.  Re-check in 3 weeks.

## 2019-02-11 DIAGNOSIS — J1289 Other viral pneumonia: Secondary | ICD-10-CM | POA: Diagnosis not present

## 2019-02-11 DIAGNOSIS — Z20828 Contact with and (suspected) exposure to other viral communicable diseases: Secondary | ICD-10-CM | POA: Diagnosis not present

## 2019-02-24 ENCOUNTER — Other Ambulatory Visit: Payer: Self-pay | Admitting: Cardiovascular Disease

## 2019-02-24 DIAGNOSIS — Z9889 Other specified postprocedural states: Secondary | ICD-10-CM

## 2019-03-02 ENCOUNTER — Ambulatory Visit (INDEPENDENT_AMBULATORY_CARE_PROVIDER_SITE_OTHER): Payer: Medicare HMO | Admitting: General Practice

## 2019-03-02 DIAGNOSIS — Z7901 Long term (current) use of anticoagulants: Secondary | ICD-10-CM

## 2019-03-02 DIAGNOSIS — Z9889 Other specified postprocedural states: Secondary | ICD-10-CM

## 2019-03-02 LAB — POCT INR: INR: 1.9 — AB (ref 2.0–3.0)

## 2019-03-02 NOTE — Patient Instructions (Addendum)
Pre visit review using our clinic review tool, if applicable. No additional management support is needed unless otherwise documented below in the visit note.  Take 17.5 mg  today (5/5) and tomorrow (5/6) and then  take 12.5 mg daily except 17.5 mg on Mondays and Fridays.  Re-check in 3 weeks.

## 2019-03-23 ENCOUNTER — Ambulatory Visit: Payer: Medicare HMO

## 2019-03-26 ENCOUNTER — Ambulatory Visit: Payer: Medicare HMO

## 2019-04-16 ENCOUNTER — Other Ambulatory Visit: Payer: Self-pay | Admitting: Internal Medicine

## 2019-04-26 ENCOUNTER — Other Ambulatory Visit: Payer: Self-pay

## 2019-04-26 ENCOUNTER — Encounter: Payer: Self-pay | Admitting: Internal Medicine

## 2019-04-26 ENCOUNTER — Ambulatory Visit (INDEPENDENT_AMBULATORY_CARE_PROVIDER_SITE_OTHER): Payer: Medicare HMO | Admitting: Internal Medicine

## 2019-04-26 VITALS — BP 126/72 | HR 114 | Temp 98.0°F | Ht 62.0 in | Wt 188.0 lb

## 2019-04-26 DIAGNOSIS — E119 Type 2 diabetes mellitus without complications: Secondary | ICD-10-CM | POA: Diagnosis not present

## 2019-04-26 DIAGNOSIS — I10 Essential (primary) hypertension: Secondary | ICD-10-CM | POA: Diagnosis not present

## 2019-04-26 DIAGNOSIS — R202 Paresthesia of skin: Secondary | ICD-10-CM

## 2019-04-26 DIAGNOSIS — R5382 Chronic fatigue, unspecified: Secondary | ICD-10-CM

## 2019-04-26 DIAGNOSIS — E785 Hyperlipidemia, unspecified: Secondary | ICD-10-CM

## 2019-04-26 DIAGNOSIS — R413 Other amnesia: Secondary | ICD-10-CM | POA: Diagnosis not present

## 2019-04-26 DIAGNOSIS — E559 Vitamin D deficiency, unspecified: Secondary | ICD-10-CM | POA: Diagnosis not present

## 2019-04-26 MED ORDER — POTASSIUM CHLORIDE CRYS ER 20 MEQ PO TBCR: 20.0000 meq | EXTENDED_RELEASE_TABLET | Freq: Every day | ORAL | 3 refills | Status: DC

## 2019-04-26 MED ORDER — PANTOPRAZOLE SODIUM 40 MG PO TBEC: 40.0000 mg | DELAYED_RELEASE_TABLET | Freq: Every day | ORAL | 3 refills | Status: DC

## 2019-04-26 MED ORDER — DILTIAZEM HCL ER COATED BEADS 180 MG PO CP24: 180.0000 mg | ORAL_CAPSULE | Freq: Every day | ORAL | 3 refills | Status: DC

## 2019-04-26 MED ORDER — GABAPENTIN 100 MG PO CAPS: 100.0000 mg | ORAL_CAPSULE | Freq: Every day | ORAL | 3 refills | Status: DC

## 2019-04-26 MED ORDER — LORATADINE 10 MG PO TABS: 10.0000 mg | ORAL_TABLET | Freq: Every day | ORAL | 3 refills | Status: DC | PRN

## 2019-04-26 MED ORDER — FUROSEMIDE 40 MG PO TABS: 40.0000 mg | ORAL_TABLET | Freq: Every day | ORAL | 3 refills | Status: DC

## 2019-04-26 NOTE — Assessment & Plan Note (Signed)
Furosemide, Cartia

## 2019-04-26 NOTE — Assessment & Plan Note (Signed)
Labs

## 2019-04-26 NOTE — Assessment & Plan Note (Signed)
Namenda 

## 2019-04-26 NOTE — Progress Notes (Signed)
Subjective:  Patient ID: Beth Miller, female    DOB: December 31, 1940  Age: 78 y.o. MRN: [id number]  CC: No chief complaint on file.   HPI Beth Miller presents for not feeling well, tired, muscles are twitching, memory is better. Taking all her meds at night. C/o muscle cramps  Outpatient Medications Prior to Visit  Medication Sig Dispense Refill  . ACCU-CHEK SOFTCLIX LANCETS lancets 1 each by Other route 2 (two) times daily. Use to check blood sugars twice a day 200 each 3  . acetaminophen (TYLENOL) 500 MG tablet Take 1,000 mg by mouth every 6 (six) hours as needed for mild pain.    . Alcohol Swabs (B-D SINGLE USE SWABS REGULAR) PADS 1 each by Does not apply route 2 (two) times daily. Use to clean finger to check blood sugars twice a day 200 each 3  . amoxicillin (AMOXIL) 500 MG tablet TAKE FOUR TABLETS ONE HOUR PRIOR TO DENTAL PROCEDURE 12 tablet 1  . Blood Glucose Calibration (ACCU-CHEK AVIVA) SOLN 1 each by In Vitro route as directed. 3 each 1  . Blood Glucose Monitoring Suppl (ACCU-CHEK AVIVA PLUS) w/Device KIT 1 each by Does not apply route 2 (two) times daily. Use as directed 1 kit 0  . Cholecalciferol (VITAMIN D3) 1000 UNIT tablet Take 1,000 Units by mouth daily.      . digoxin (LANOXIN) 0.25 MG tablet TAKE 1 TABLET AT BEDTIME 90 tablet 3  . diltiazem (CARDIZEM CD) 180 MG 24 hr capsule TAKE 1 CAPSULE (180 MG TOTAL) BY MOUTH DAILY. 90 capsule 3  . furosemide (LASIX) 40 MG tablet TAKE 1 TABLET AT BEDTIME 90 tablet 3  . gabapentin (NEURONTIN) 100 MG capsule Take 1 capsule (100 mg total) by mouth 2 (two) times daily as needed. For neuropathy 90 capsule 3  . glucose blood (ACCU-CHEK AVIVA PLUS) test strip 1 each by Other route 2 (two) times daily. Use to check blood sugars twice a day 200 each 3  . HYDROcodone-acetaminophen (NORCO/VICODIN) 5-325 MG tablet Take 0.5-1 tablets by mouth 2 (two) times daily as needed for severe pain. 60 tablet 0  . Lancet Devices (ACCU-CHEK SOFTCLIX)  lancets 1 each by Other route 2 (two) times daily. Use as instructed 1 each 5  . loratadine (CLARITIN) 10 MG tablet Take 10 mg by mouth daily as needed for allergies.     . memantine (NAMENDA) 5 MG tablet Take 1 tablet (5 mg total) by mouth 2 (two) times daily. 180 tablet 1  . mirabegron ER (MYRBETRIQ) 25 MG TB24 tablet Take 1 tablet (25 mg total) by mouth daily. 90 tablet 1  . ondansetron (ZOFRAN) 4 MG tablet Take 1 tablet (4 mg total) by mouth every 8 (eight) hours as needed for nausea or vomiting. 20 tablet 0  . OVER THE COUNTER MEDICATION Take 1 tablet by mouth as needed (joint pain).     . pantoprazole (PROTONIX) 40 MG tablet Take 1 tablet (40 mg total) by mouth daily. 90 tablet 3  . potassium chloride SA (KLOR-CON M20) 20 MEQ tablet Take 1 tablet (20 mEq total) by mouth daily. 90 tablet 3  . triamcinolone cream (KENALOG) 0.5 % Apply 1 application topically 3 (three) times daily as needed. 60 g 1  . vitamin B-12 (CYANOCOBALAMIN) 1000 MCG tablet Take 1,000 mcg by mouth daily as needed (energy).    . warfarin (COUMADIN) 10 MG tablet TAKE 1 TABLET DAILY OR AS DIRECTED BY ANTICOAGULATION CLINIC. 105 tablet 1  . warfarin (COUMADIN)  5 MG tablet TAKE AS DIRECTED 40 tablet 1   No facility-administered medications prior to visit.     ROS: Review of Systems  Constitutional: Positive for fatigue. Negative for activity change, appetite change, chills and unexpected weight change.  HENT: Negative for congestion, mouth sores and sinus pressure.   Eyes: Negative for visual disturbance.  Respiratory: Negative for cough and chest tightness.   Gastrointestinal: Negative for abdominal pain and nausea.  Genitourinary: Negative for difficulty urinating, frequency and vaginal pain.  Musculoskeletal: Negative for back pain and gait problem.  Skin: Negative for pallor and rash.  Neurological: Negative for dizziness, tremors, weakness, numbness and headaches.  Psychiatric/Behavioral: Negative for confusion,  sleep disturbance and suicidal ideas.    Objective:  BP 126/72 (BP Location: Left Arm, Patient Position: Sitting, Cuff Size: Large)   Pulse (!) 114   Temp 98 F (36.7 C) (Oral)   Ht 5' 2"  (1.575 m)   Wt 188 lb (85.3 kg)   LMP  (LMP Unknown)   SpO2 97%   BMI 34.39 kg/m   BP Readings from Last 3 Encounters:  04/26/19 126/72  11/04/18 120/68  09/09/18 124/78    Wt Readings from Last 3 Encounters:  04/26/19 188 lb (85.3 kg)  11/04/18 184 lb (83.5 kg)  09/09/18 186 lb (84.4 kg)    Physical Exam Constitutional:      General: She is not in acute distress.    Appearance: She is well-developed.  HENT:     Head: Normocephalic.     Right Ear: External ear normal.     Left Ear: External ear normal.     Nose: Nose normal.  Eyes:     General:        Right eye: No discharge.        Left eye: No discharge.     Conjunctiva/sclera: Conjunctivae normal.     Pupils: Pupils are equal, round, and reactive to light.  Neck:     Musculoskeletal: Normal range of motion and neck supple.     Thyroid: No thyromegaly.     Vascular: No JVD.     Trachea: No tracheal deviation.  Cardiovascular:     Rate and Rhythm: Normal rate and regular rhythm.     Heart sounds: Normal heart sounds.  Pulmonary:     Effort: No respiratory distress.     Breath sounds: No stridor. No wheezing.  Abdominal:     General: Bowel sounds are normal. There is no distension.     Palpations: Abdomen is soft. There is no mass.     Tenderness: There is no abdominal tenderness. There is no guarding or rebound.  Musculoskeletal:        General: No tenderness.  Lymphadenopathy:     Cervical: No cervical adenopathy.  Skin:    Findings: No erythema or rash.  Neurological:     Cranial Nerves: No cranial nerve deficit.     Motor: No abnormal muscle tone.     Coordination: Coordination normal.     Deep Tendon Reflexes: Reflexes normal.  Psychiatric:        Behavior: Behavior normal.        Thought Content: Thought  content normal.        Judgment: Judgment normal.    No tremor   Lab Results  Component Value Date   WBC 7.7 09/09/2018   HGB 11.7 (L) 09/09/2018   HCT 34.9 (L) 09/09/2018   PLT 290.0 09/09/2018   GLUCOSE 130 (H) 09/09/2018  CHOL 113 11/07/2016   TRIG 111.0 11/07/2016   HDL 42.80 11/07/2016   LDLCALC 48 11/07/2016   ALT 10 09/09/2018   AST 14 09/09/2018   NA 142 09/09/2018   K 4.1 09/09/2018   CL 104 09/09/2018   CREATININE 1.08 09/09/2018   BUN 15 09/09/2018   CO2 32 09/09/2018   TSH 1.96 05/21/2017   INR 1.9 (A) 03/02/2019   HGBA1C 6.7 (H) 09/09/2018    Ct Abdomen Pelvis W Contrast  Result Date: 09/23/2018 CLINICAL DATA:  Generalized abdominal pain. EXAM: CT ABDOMEN AND PELVIS WITH CONTRAST TECHNIQUE: Multidetector CT imaging of the abdomen and pelvis was performed using the standard protocol following bolus administration of intravenous contrast. CONTRAST:  19m ISOVUE-300 IOPAMIDOL (ISOVUE-300) INJECTION 61% COMPARISON:  CT scan of December 20, 2016. FINDINGS: Lower chest: No acute abnormality. Hepatobiliary: No focal liver abnormality is seen. Status post cholecystectomy. No biliary dilatation. Pancreas: Unremarkable. No pancreatic ductal dilatation or surrounding inflammatory changes. Spleen: Normal in size without focal abnormality. Adrenals/Urinary Tract: Adrenal glands appear normal. Stable large left renal cyst is noted. No hydronephrosis or renal obstruction is noted. No renal or ureteral calculi are noted. Urinary bladder is unremarkable. Stomach/Bowel: Stomach is within normal limits. Appendix appears normal. No evidence of bowel wall thickening, distention, or inflammatory changes. Vascular/Lymphatic: Aortic atherosclerosis. No enlarged abdominal or pelvic lymph nodes. Reproductive: Status post hysterectomy. No adnexal masses. Other: No abdominal wall hernia or abnormality. No abdominopelvic ascites. Musculoskeletal: Stable large sclerotic density is seen in right  iliac bone most consistent with benign enostosis. No acute abnormality is noted. IMPRESSION: No acute abnormality seen in the abdomen or pelvis. Aortic Atherosclerosis (ICD10-I70.0). Electronically Signed   By: JMarijo Conception M.D.   On: 09/23/2018 09:32    Assessment & Plan:   There are no diagnoses linked to this encounter.   No orders of the defined types were placed in this encounter.    Follow-up: No follow-ups on file.  AWalker Kehr MD

## 2019-04-26 NOTE — Assessment & Plan Note (Signed)
Vit D 

## 2019-04-27 ENCOUNTER — Other Ambulatory Visit (INDEPENDENT_AMBULATORY_CARE_PROVIDER_SITE_OTHER): Payer: Medicare HMO

## 2019-04-27 ENCOUNTER — Other Ambulatory Visit: Payer: Self-pay | Admitting: Internal Medicine

## 2019-04-27 DIAGNOSIS — E559 Vitamin D deficiency, unspecified: Secondary | ICD-10-CM

## 2019-04-27 DIAGNOSIS — R5382 Chronic fatigue, unspecified: Secondary | ICD-10-CM

## 2019-04-27 DIAGNOSIS — R202 Paresthesia of skin: Secondary | ICD-10-CM | POA: Diagnosis not present

## 2019-04-27 DIAGNOSIS — E785 Hyperlipidemia, unspecified: Secondary | ICD-10-CM | POA: Diagnosis not present

## 2019-04-27 DIAGNOSIS — R413 Other amnesia: Secondary | ICD-10-CM | POA: Diagnosis not present

## 2019-04-27 DIAGNOSIS — E119 Type 2 diabetes mellitus without complications: Secondary | ICD-10-CM

## 2019-04-27 LAB — HEPATIC FUNCTION PANEL
ALT: 15 U/L (ref 0–35)
AST: 24 U/L (ref 0–37)
Albumin: 4.1 g/dL (ref 3.5–5.2)
Alkaline Phosphatase: 79 U/L (ref 39–117)
Bilirubin, Direct: 0.2 mg/dL (ref 0.0–0.3)
Total Bilirubin: 0.5 mg/dL (ref 0.2–1.2)
Total Protein: 7.4 g/dL (ref 6.0–8.3)

## 2019-04-27 LAB — CBC WITH DIFFERENTIAL/PLATELET
Basophils Absolute: 0 10*3/uL (ref 0.0–0.1)
Basophils Relative: 0.8 % (ref 0.0–3.0)
Eosinophils Absolute: 0.1 10*3/uL (ref 0.0–0.7)
Eosinophils Relative: 1.2 % (ref 0.0–5.0)
HCT: 33.7 % — ABNORMAL LOW (ref 36.0–46.0)
Hemoglobin: 11.2 g/dL — ABNORMAL LOW (ref 12.0–15.0)
Lymphocytes Relative: 26.2 % (ref 12.0–46.0)
Lymphs Abs: 1.6 10*3/uL (ref 0.7–4.0)
MCHC: 33.4 g/dL (ref 30.0–36.0)
MCV: 81.7 fl (ref 78.0–100.0)
Monocytes Absolute: 0.8 10*3/uL (ref 0.1–1.0)
Monocytes Relative: 12.4 % — ABNORMAL HIGH (ref 3.0–12.0)
Neutro Abs: 3.7 10*3/uL (ref 1.4–7.7)
Neutrophils Relative %: 59.4 % (ref 43.0–77.0)
Platelets: 282 10*3/uL (ref 150.0–400.0)
RBC: 4.13 Mil/uL (ref 3.87–5.11)
RDW: 16.5 % — ABNORMAL HIGH (ref 11.5–15.5)
WBC: 6.2 10*3/uL (ref 4.0–10.5)

## 2019-04-27 LAB — BASIC METABOLIC PANEL
BUN: 13 mg/dL (ref 6–23)
CO2: 31 mEq/L (ref 19–32)
Calcium: 9.2 mg/dL (ref 8.4–10.5)
Chloride: 101 mEq/L (ref 96–112)
Creatinine, Ser: 1.07 mg/dL (ref 0.40–1.20)
GFR: 59.75 mL/min — ABNORMAL LOW (ref >60.00)
Glucose, Bld: 181 mg/dL — ABNORMAL HIGH (ref 70–99)
Potassium: 4.4 mEq/L (ref 3.5–5.1)
Sodium: 138 mEq/L (ref 135–145)

## 2019-04-27 LAB — URINALYSIS, ROUTINE W REFLEX MICROSCOPIC
Bilirubin Urine: NEGATIVE
Ketones, ur: NEGATIVE
Leukocytes,Ua: NEGATIVE
Nitrite: NEGATIVE
Specific Gravity, Urine: 1.02 (ref 1.000–1.030)
Total Protein, Urine: NEGATIVE
Urine Glucose: NEGATIVE
Urobilinogen, UA: 0.2 (ref 0.0–1.0)
pH: 5.5 (ref 5.0–8.0)

## 2019-04-27 LAB — HEMOGLOBIN A1C: Hgb A1c MFr Bld: 7.8 % — ABNORMAL HIGH (ref 4.6–6.5)

## 2019-04-27 LAB — VITAMIN D 25 HYDROXY (VIT D DEFICIENCY, FRACTURES): VITD: 44.59 ng/mL (ref 30.00–100.00)

## 2019-04-27 LAB — LIPID PANEL
Cholesterol: 106 mg/dL (ref 0–200)
HDL: 37.4 mg/dL — ABNORMAL LOW (ref >39.00)
LDL Cholesterol: 33 mg/dL (ref 0–99)
NonHDL: 68.89
Total CHOL/HDL Ratio: 3
Triglycerides: 177 mg/dL — ABNORMAL HIGH (ref 0.0–149.0)
VLDL: 35.4 mg/dL (ref 0.0–40.0)

## 2019-04-27 LAB — VITAMIN B12: Vitamin B-12: 1240 pg/mL — ABNORMAL HIGH (ref 211–911)

## 2019-04-27 LAB — TSH: TSH: 3.01 u[IU]/mL (ref 0.35–4.50)

## 2019-04-27 MED ORDER — METFORMIN HCL 500 MG PO TABS: 500.0000 mg | ORAL_TABLET | Freq: Every day | ORAL | 3 refills | Status: DC

## 2019-05-05 ENCOUNTER — Ambulatory Visit: Payer: Medicare HMO

## 2019-05-12 DIAGNOSIS — Z20828 Contact with and (suspected) exposure to other viral communicable diseases: Secondary | ICD-10-CM | POA: Diagnosis not present

## 2019-05-12 DIAGNOSIS — B9729 Other coronavirus as the cause of diseases classified elsewhere: Secondary | ICD-10-CM | POA: Diagnosis not present

## 2019-05-26 ENCOUNTER — Other Ambulatory Visit: Payer: Self-pay | Admitting: *Deleted

## 2019-05-26 MED ORDER — ACCU-CHEK AVIVA PLUS W/DEVICE KIT: 1.0000 | PACK | Freq: Two times a day (BID) | 0 refills | Status: DC

## 2019-05-26 MED ORDER — ACCU-CHEK AVIVA PLUS VI STRP: 1.0000 | ORAL_STRIP | Freq: Two times a day (BID) | 3 refills | Status: DC

## 2019-06-16 ENCOUNTER — Other Ambulatory Visit: Payer: Self-pay

## 2019-06-16 ENCOUNTER — Ambulatory Visit (INDEPENDENT_AMBULATORY_CARE_PROVIDER_SITE_OTHER): Payer: Medicare HMO | Admitting: Internal Medicine

## 2019-06-16 ENCOUNTER — Encounter: Payer: Self-pay | Admitting: Internal Medicine

## 2019-06-16 VITALS — BP 108/70 | HR 107 | Temp 98.3°F | Ht 62.0 in | Wt 188.0 lb

## 2019-06-16 DIAGNOSIS — E119 Type 2 diabetes mellitus without complications: Secondary | ICD-10-CM | POA: Diagnosis not present

## 2019-06-16 DIAGNOSIS — I4811 Longstanding persistent atrial fibrillation: Secondary | ICD-10-CM | POA: Diagnosis not present

## 2019-06-16 DIAGNOSIS — Z23 Encounter for immunization: Secondary | ICD-10-CM

## 2019-06-16 DIAGNOSIS — I10 Essential (primary) hypertension: Secondary | ICD-10-CM | POA: Diagnosis not present

## 2019-06-16 MED ORDER — REPAGLINIDE 1 MG PO TABS: 1.0000 mg | ORAL_TABLET | Freq: Three times a day (TID) | ORAL | 3 refills | Status: DC

## 2019-06-16 MED ORDER — FREESTYLE LIBRE 2 READER SYSTM DEVI: 1.0000 | Freq: Every day | 0 refills | Status: DC

## 2019-06-16 MED ORDER — FREESTYLE LIBRE 14 DAY READER DEVI: 1.0000 [IU] | Freq: Every day | 11 refills | Status: DC

## 2019-06-16 MED ORDER — METFORMIN HCL 500 MG PO TABS: 500.0000 mg | ORAL_TABLET | Freq: Two times a day (BID) | ORAL | 3 refills | Status: DC

## 2019-06-16 NOTE — Progress Notes (Signed)
Subjective:  Patient ID: Beth Miller, female    DOB: 1941/09/07  Age: 78 y.o. MRN: [id number]  CC: No chief complaint on file.   HPI PERSAIS ETHRIDGE presents for DM - CBGs >200 on Metformin q am F/u HTN, GERD  Outpatient Medications Prior to Visit  Medication Sig Dispense Refill  . ACCU-CHEK SOFTCLIX LANCETS lancets 1 each by Other route 2 (two) times daily. Use to check blood sugars twice a day 200 each 3  . acetaminophen (TYLENOL) 500 MG tablet Take 1,000 mg by mouth every 6 (six) hours as needed for mild pain.    . Alcohol Swabs (B-D SINGLE USE SWABS REGULAR) PADS 1 each by Does not apply route 2 (two) times daily. Use to clean finger to check blood sugars twice a day 200 each 3  . amoxicillin (AMOXIL) 500 MG tablet TAKE FOUR TABLETS ONE HOUR PRIOR TO DENTAL PROCEDURE 12 tablet 1  . Blood Glucose Calibration (ACCU-CHEK AVIVA) SOLN 1 each by In Vitro route as directed. 3 each 1  . Blood Glucose Monitoring Suppl (ACCU-CHEK AVIVA PLUS) w/Device KIT 1 each by Does not apply route 2 (two) times daily. Use as directed 1 kit 0  . Cholecalciferol (VITAMIN D3) 1000 UNIT tablet Take 1,000 Units by mouth daily.      . digoxin (LANOXIN) 0.25 MG tablet TAKE 1 TABLET AT BEDTIME 90 tablet 3  . diltiazem (CARDIZEM CD) 180 MG 24 hr capsule Take 1 capsule (180 mg total) by mouth daily. Take in am 90 capsule 3  . furosemide (LASIX) 40 MG tablet Take 1 tablet (40 mg total) by mouth daily. Take in am 90 tablet 3  . gabapentin (NEURONTIN) 100 MG capsule Take 1 capsule (100 mg total) by mouth at bedtime. For cramps 90 capsule 3  . glucose blood (ACCU-CHEK AVIVA PLUS) test strip 1 each by Other route 2 (two) times daily. Use to check blood sugars twice a day 200 each 3  . Lancet Devices (ACCU-CHEK SOFTCLIX) lancets 1 each by Other route 2 (two) times daily. Use as instructed 1 each 5  . loratadine (CLARITIN) 10 MG tablet Take 1 tablet (10 mg total) by mouth daily as needed for allergies. Take in am 100  tablet 3  . memantine (NAMENDA) 5 MG tablet Take 1 tablet (5 mg total) by mouth 2 (two) times daily. 180 tablet 1  . metFORMIN (GLUCOPHAGE) 500 MG tablet Take 1 tablet (500 mg total) by mouth daily with breakfast. 90 tablet 3  . ondansetron (ZOFRAN) 4 MG tablet Take 1 tablet (4 mg total) by mouth every 8 (eight) hours as needed for nausea or vomiting. 20 tablet 0  . OVER THE COUNTER MEDICATION Take 1 tablet by mouth as needed (joint pain).     . pantoprazole (PROTONIX) 40 MG tablet Take 1 tablet (40 mg total) by mouth daily. Take in am 90 tablet 3  . potassium chloride SA (KLOR-CON M20) 20 MEQ tablet Take 1 tablet (20 mEq total) by mouth daily. Take in am 90 tablet 3  . vitamin B-12 (CYANOCOBALAMIN) 1000 MCG tablet Take 1,000 mcg by mouth daily as needed (energy).    . warfarin (COUMADIN) 10 MG tablet TAKE 1 TABLET DAILY OR AS DIRECTED BY ANTICOAGULATION CLINIC. 105 tablet 1  . warfarin (COUMADIN) 5 MG tablet TAKE AS DIRECTED 40 tablet 1   No facility-administered medications prior to visit.     ROS: Review of Systems  Constitutional: Negative for activity change, appetite change, chills,  fatigue and unexpected weight change.  HENT: Negative for congestion, mouth sores and sinus pressure.   Eyes: Negative for visual disturbance.  Respiratory: Negative for cough and chest tightness.   Gastrointestinal: Negative for abdominal pain and nausea.  Genitourinary: Negative for difficulty urinating, frequency and vaginal pain.  Musculoskeletal: Negative for back pain and gait problem.  Skin: Negative for pallor and rash.  Neurological: Negative for dizziness, tremors, weakness, numbness and headaches.  Psychiatric/Behavioral: Negative for confusion, sleep disturbance and suicidal ideas.    Objective:  BP 108/70 (BP Location: Left Arm, Patient Position: Sitting, Cuff Size: Large)   Pulse (!) 107   Temp 98.3 F (36.8 C) (Oral)   Ht '5\' 2"'$  (1.575 m)   Wt 188 lb (85.3 kg)   LMP  (LMP Unknown)    SpO2 96%   BMI 34.39 kg/m   BP Readings from Last 3 Encounters:  06/16/19 108/70  04/26/19 126/72  11/04/18 120/68    Wt Readings from Last 3 Encounters:  06/16/19 188 lb (85.3 kg)  04/26/19 188 lb (85.3 kg)  11/04/18 184 lb (83.5 kg)    Physical Exam Constitutional:      General: She is not in acute distress.    Appearance: She is well-developed.  HENT:     Head: Normocephalic.     Right Ear: External ear normal.     Left Ear: External ear normal.     Nose: Nose normal.  Eyes:     General:        Right eye: No discharge.        Left eye: No discharge.     Conjunctiva/sclera: Conjunctivae normal.     Pupils: Pupils are equal, round, and reactive to light.  Neck:     Musculoskeletal: Normal range of motion and neck supple.     Thyroid: No thyromegaly.     Vascular: No JVD.     Trachea: No tracheal deviation.  Cardiovascular:     Rate and Rhythm: Normal rate. Rhythm irregular.     Heart sounds: Normal heart sounds. No murmur.  Pulmonary:     Effort: No respiratory distress.     Breath sounds: No stridor. No wheezing.  Abdominal:     General: Bowel sounds are normal. There is no distension.     Palpations: Abdomen is soft. There is no mass.     Tenderness: There is no abdominal tenderness. There is no guarding or rebound.  Musculoskeletal:        General: No tenderness.     Right lower leg: Edema present.     Left lower leg: Edema present.  Lymphadenopathy:     Cervical: No cervical adenopathy.  Skin:    Findings: No erythema or rash.  Neurological:     Cranial Nerves: No cranial nerve deficit.     Motor: No abnormal muscle tone.     Coordination: Coordination normal.     Deep Tendon Reflexes: Reflexes normal.  Psychiatric:        Behavior: Behavior normal.        Thought Content: Thought content normal.        Judgment: Judgment normal.   trace edema B Pt looks very young  Lab Results  Component Value Date   WBC 6.2 04/27/2019   HGB 11.2 (L)  04/27/2019   HCT 33.7 (L) 04/27/2019   PLT 282.0 04/27/2019   GLUCOSE 181 (H) 04/27/2019   CHOL 106 04/27/2019   TRIG 177.0 (H) 04/27/2019   HDL 37.40 (  L) 04/27/2019   LDLCALC 33 04/27/2019   ALT 15 04/27/2019   AST 24 04/27/2019   NA 138 04/27/2019   K 4.4 04/27/2019   CL 101 04/27/2019   CREATININE 1.07 04/27/2019   BUN 13 04/27/2019   CO2 31 04/27/2019   TSH 3.01 04/27/2019   INR 1.9 (A) 03/02/2019   HGBA1C 7.8 (H) 04/27/2019    Ct Abdomen Pelvis W Contrast  Result Date: 09/23/2018 CLINICAL DATA:  Generalized abdominal pain. EXAM: CT ABDOMEN AND PELVIS WITH CONTRAST TECHNIQUE: Multidetector CT imaging of the abdomen and pelvis was performed using the standard protocol following bolus administration of intravenous contrast. CONTRAST:  199m ISOVUE-300 IOPAMIDOL (ISOVUE-300) INJECTION 61% COMPARISON:  CT scan of December 20, 2016. FINDINGS: Lower chest: No acute abnormality. Hepatobiliary: No focal liver abnormality is seen. Status post cholecystectomy. No biliary dilatation. Pancreas: Unremarkable. No pancreatic ductal dilatation or surrounding inflammatory changes. Spleen: Normal in size without focal abnormality. Adrenals/Urinary Tract: Adrenal glands appear normal. Stable large left renal cyst is noted. No hydronephrosis or renal obstruction is noted. No renal or ureteral calculi are noted. Urinary bladder is unremarkable. Stomach/Bowel: Stomach is within normal limits. Appendix appears normal. No evidence of bowel wall thickening, distention, or inflammatory changes. Vascular/Lymphatic: Aortic atherosclerosis. No enlarged abdominal or pelvic lymph nodes. Reproductive: Status post hysterectomy. No adnexal masses. Other: No abdominal wall hernia or abnormality. No abdominopelvic ascites. Musculoskeletal: Stable large sclerotic density is seen in right iliac bone most consistent with benign enostosis. No acute abnormality is noted. IMPRESSION: No acute abnormality seen in the abdomen or  pelvis. Aortic Atherosclerosis (ICD10-I70.0). Electronically Signed   By: JMarijo Conception M.D.   On: 09/23/2018 09:32    Assessment & Plan:   Diagnoses and all orders for this visit:  Need for influenza vaccination -     Flu Vaccine QUAD High Dose(Fluad)     No orders of the defined types were placed in this encounter.    Follow-up: No follow-ups on file.  AWalker Kehr MD

## 2019-06-16 NOTE — Assessment & Plan Note (Addendum)
Freestyle Libre Prandin tid ac Metformin

## 2019-06-16 NOTE — Patient Instructions (Signed)

## 2019-06-17 ENCOUNTER — Ambulatory Visit: Payer: Self-pay | Admitting: *Deleted

## 2019-06-17 NOTE — Telephone Encounter (Signed)
I returned pt's call.   "I got up this morning.   Felt like something was  in my throat so I cleared it and a glob of blood came up".  It looked like fresh blood.   Denies a sore throat, difficulty breathing.  I feel there is always something in the back of my throat.    I asked her about having post nasal drip and she replied,  "Yes I have that problem".    When I asked her if she has noticed blood when she blows her nose she replied,   "Lord, " haven't blown my nose in I couldn't tell you when"!   "I haven't had any more blood since this morning and this is the first time it's happened.   I did snore last night.     I had to take aspirin as a child and "I think it burned a hole back there in my throat and maybe that's it".    "I think that's what caused me to snore last night too".    I'm on Coumadin.   I've missed a couple of appts for my check.     I called into Dr. Judeen Hammans office and spoke with [place].   She requested I send my notes over and they will check with Jenny Reichmann the nurse that works the Coumadin lab and also Dr. Alain Marion.    (Pt saw Dr. Alain Marion yesterday)    They will call her back.  I let the pt know this and she was agreeable.  I sent my notes to Dr. Judeen Hammans nurse pool for further disposition as requested.  (I could not find a protocol that was appropriate for this pt reason there is not a protocol listed).

## 2019-06-17 NOTE — Telephone Encounter (Signed)
Please advise,  &Cindy, when should patient come in for coumadin check?

## 2019-06-17 NOTE — Telephone Encounter (Signed)
Unable to reach patient.  LMOVM

## 2019-06-17 NOTE — Assessment & Plan Note (Signed)
Cartia, Digoxin, Coumadin 

## 2019-06-17 NOTE — Assessment & Plan Note (Signed)
On Furosemide, [place] 

## 2019-06-18 ENCOUNTER — Encounter (HOSPITAL_COMMUNITY): Payer: Self-pay

## 2019-06-18 ENCOUNTER — Emergency Department (HOSPITAL_COMMUNITY): Payer: Medicare HMO

## 2019-06-18 ENCOUNTER — Other Ambulatory Visit: Payer: Self-pay

## 2019-06-18 ENCOUNTER — Emergency Department (HOSPITAL_COMMUNITY)
Admission: EM | Admit: 2019-06-18 | Discharge: 2019-06-18 | Disposition: A | Discharge status: Home / Self Care | Payer: Medicare HMO | Attending: Emergency Medicine | Admitting: Emergency Medicine

## 2019-06-18 DIAGNOSIS — K1379 Other lesions of oral mucosa: Secondary | ICD-10-CM | POA: Diagnosis not present

## 2019-06-18 DIAGNOSIS — R590 Localized enlarged lymph nodes: Secondary | ICD-10-CM | POA: Diagnosis not present

## 2019-06-18 DIAGNOSIS — Z87891 Personal history of nicotine dependence: Secondary | ICD-10-CM | POA: Diagnosis not present

## 2019-06-18 DIAGNOSIS — E119 Type 2 diabetes mellitus without complications: Secondary | ICD-10-CM | POA: Diagnosis not present

## 2019-06-18 DIAGNOSIS — I4891 Unspecified atrial fibrillation: Secondary | ICD-10-CM | POA: Diagnosis not present

## 2019-06-18 DIAGNOSIS — I10 Essential (primary) hypertension: Secondary | ICD-10-CM | POA: Diagnosis not present

## 2019-06-18 DIAGNOSIS — I509 Heart failure, unspecified: Secondary | ICD-10-CM | POA: Insufficient documentation

## 2019-06-18 DIAGNOSIS — E049 Nontoxic goiter, unspecified: Secondary | ICD-10-CM | POA: Diagnosis not present

## 2019-06-18 LAB — BASIC METABOLIC PANEL
Anion gap: 9 (ref 5–15)
BUN: 12 mg/dL (ref 8–23)
CO2: 28 mmol/L (ref 22–32)
Calcium: 9.4 mg/dL (ref 8.9–10.3)
Chloride: 101 mmol/L (ref 98–111)
Creatinine, Ser: 1.13 mg/dL — ABNORMAL HIGH (ref 0.44–1.00)
GFR calc Af Amer: 54 mL/min — ABNORMAL LOW (ref >60)
GFR calc non Af Amer: 46 mL/min — ABNORMAL LOW (ref >60)
Glucose, Bld: 188 mg/dL — ABNORMAL HIGH (ref 70–99)
Potassium: 4.1 mmol/L (ref 3.5–5.1)
Sodium: 138 mmol/L (ref 135–145)

## 2019-06-18 LAB — CBC
HCT: 33.4 % — ABNORMAL LOW (ref 36.0–46.0)
Hemoglobin: 11.2 g/dL — ABNORMAL LOW (ref 12.0–15.0)
MCH: 27.1 pg (ref 26.0–34.0)
MCHC: 33.5 g/dL (ref 30.0–36.0)
MCV: 80.9 fL (ref 80.0–100.0)
Platelets: 285 10*3/uL (ref 150–400)
RBC: 4.13 MIL/uL (ref 3.87–5.11)
RDW: 15.5 % (ref 11.5–15.5)
WBC: 5.5 10*3/uL (ref 4.0–10.5)
nRBC: 0 % (ref 0.0–0.2)

## 2019-06-18 MED ORDER — IOHEXOL 300 MG/ML  SOLN
75.0000 mL | Freq: Once | INTRAMUSCULAR | Status: AC | PRN
  Administered 2019-06-18: 75 mL via INTRAVENOUS

## 2019-06-18 NOTE — ED Triage Notes (Signed)
Pt reports yesterday she woke up & coughed up a moderate sized, bright-red blood clot. Stated that she keeps feeling like it's coming from her throat. She denies SOB & fever as well, A/Ox4 upon arrival to ED.

## 2019-06-18 NOTE — ED Provider Notes (Signed)
[facility] Emergency Department Provider Note MRN:  [id number]  Arrival date & time: 06/18/19     Chief Complaint   Coughing up blood History of Present Illness   Beth Miller is a 78 y.o. year-old female with a history of A. fib, CHF, diabetes presenting to the ED with chief complaint of coughing up blood.  Patient describes 2 episodes yesterday where she woke up with blood in the back of her throat.  First occurred when she woke up in the morning, got up, felt a sensation of the back of her throat and spit up a blood clot.  Took a nap in the afternoon, and it happened again.  No bleeding this morning.  Denies chest pain, no trouble breathing, no abdominal pain, no black stool, no blood in the stool, no recent cold-like symptoms, no sore throat, no epistaxis.  Review of Systems  A complete 10 system review of systems was obtained and all systems are negative except as noted in the HPI and PMH.   Patient's Health History    Past Medical History:  Diagnosis Date   AF (atrial fibrillation) ([place])    ALLERGIC RHINITIS    Blood transfusion without reported diagnosis    Cataract, senile    Chest pain    CHF (congestive heart failure) (H[facility])    Chronic anticoagulation    Diabetes mellitus    Diabetes mellitus    Disorder of oral soft tissue    of mouth   Diverticulosis    GERD (gastroesophageal reflux disease)    Hematoma    Hypertension    Leg pain    Low back pain    Paresthesia    Rheumatic fever    Status post mitral valve replacement    St. Jude valve   Stroke ([place])    tia   Sweating    Vitamin D deficiency     Past Surgical History:  Procedure Laterality Date   ABDOMINAL HYSTERECTOMY     CARDIAC VALVE REPLACEMENT  1995   Mitral valve prosthesis; st jude   CHOLECYSTECTOMY      Family History  Problem Relation Age of Onset   Diabetes Mother    Seizures Mother    Other Mother        brain tumor   Heart  disease Brother    Diabetes Brother    Heart disease Brother    Pancreatic cancer Brother    Colon cancer Neg Hx     Social History   Socioeconomic History   Marital status: Married    Spouse name: Not on file   Number of children: 1   Years of education: Not on file   Highest education level: Not on file  Occupational History   Occupation: Retired    Fish farm manager: RETIRED  Scientist, product/process development strain: Not on file   Food insecurity    Worry: Not on file    Inability: Not on file   Transportation needs    Medical: Not on file    Non-medical: Not on file  Tobacco Use   Smoking status: Former Smoker    Packs/day: 0.02    Years: 60.00    Pack years: 1.20    Types: Cigarettes    Quit date: 10/28/2002    Years since quitting: 16.6   Smokeless tobacco: Never Used   Tobacco comment: quit x 13 years   Substance and Sexual Activity   Alcohol use: No  Alcohol/week: 0.0 standard drinks   Drug use: No   Sexual activity: Yes  Lifestyle   Physical activity    Days per week: Not on file    Minutes per session: Not on file   Stress: Not on file  Relationships   Social connections    Talks on phone: Not on file    Gets together: Not on file    Attends religious service: Not on file    Active member of club or organization: Not on file    Attends meetings of clubs or organizations: Not on file    Relationship status: Not on file   Intimate partner violence    Fear of current or ex partner: Not on file    Emotionally abused: Not on file    Physically abused: Not on file    Forced sexual activity: Not on file  Other Topics Concern   Not on file  Social History Narrative   Negative Family History of Colon Cancer      Regular exercise-yes, bowling      Daily caffeine Use-rare     Physical Exam  Vital Signs and Nursing Notes reviewed Vitals:   06/18/19 1415 06/18/19 1500  BP: 116/72 126/66  Pulse: (!) 102 (!) 51  Resp:    Temp:      SpO2: 94% 93%    CONSTITUTIONAL: Well-appearing, NAD NEURO:  Alert and oriented x 3, no focal deficits EYES:  eyes equal and reactive ENT/NECK:  no LAD, no JVD; mild irritation of the mucosa of the right nasal passage CARDIO: Regular rate, well-perfused, normal S1 and S2 PULM:  CTAB no wheezing or rhonchi GI/GU:  normal bowel sounds, non-distended, non-tender MSK/SPINE:  No gross deformities, no edema SKIN:  no rash, atraumatic PSYCH:  Appropriate speech and behavior  Diagnostic and Interventional Summary    Labs Reviewed  CBC - Abnormal; Notable for the following components:      Result Value   Hemoglobin 11.2 (*)    HCT 33.4 (*)    All other components within normal limits  BASIC METABOLIC PANEL - Abnormal; Notable for the following components:   Glucose, Bld 188 (*)    Creatinine, Ser 1.13 (*)    GFR calc non Af Amer 46 (*)    GFR calc Af Amer 54 (*)    All other components within normal limits    CT Soft Tissue Neck W Contrast  Final Result      Medications  iohexol (OMNIPAQUE) 300 MG/ML solution 75 mL (75 mLs Intravenous Contrast Given 06/18/19 1430)     Procedures Critical Care  ED Course and Medical Decision Making  I have reviewed the triage vital signs and the nursing notes.  Pertinent labs & imaging results that were available during my care of the patient were reviewed by me and considered in my medical decision making (see below for details).  No chest pain or shortness of breath to suggest pulmonary embolism, no epigastric pain or black stool or blood in the stool to suggest GI etiology.  Blood could be from the irritation and mild bleeding from the nasal passage.  Also considering a neoplasm of the head and neck given the patient continues to feel this globus sensation in the back of the throat.  Will obtain CT to further evaluate.  If not acute and basic labs are reassuring, patient, who provided with reassurance and follow-up with ENT if she continues to  have this globus sensation.  Labs reassuring with  normal H&H, CT is unremarkable.  No further bleeding episodes here in the emergency department.  I suspect this is some pooling of blood from the irritation found in the right nare.  Advise ENT follow-up if she continues to feel this foreign body sensation.  After the discussed management above, the patient was determined to be safe for discharge.  The patient was in agreement with this plan and all questions regarding their care were answered.  ED return precautions were discussed and the patient will return to the ED with any significant worsening of condition.  Barth Kirks. [place] Small, [place] [email or web address]  Final Clinical Impressions(s) / ED Diagnoses     ICD-10-CM   1. Blood in mouth of unknown source  K13.79     ED Discharge Orders    None      Discharge Instructions Discussed with and Provided to Patient:   Discharge Instructions     You were evaluated in the Emergency Department and after careful evaluation, we did not find any emergent condition requiring admission or further testing in the hospital.  Your symptoms today seem to be due to bleeding from the nose.  Your testing today was overall reassuring.  If you continue to feel the sensation of something in your throat, we recommend ENT follow-up.  Please return to the Emergency Department if you experience any worsening of your condition.  We encourage you to follow up with a primary care provider.  Thank you for allowing [place] to be a part of your care.       Maudie Flakes, MD 06/18/19 [phone]

## 2019-06-18 NOTE — Discharge Instructions (Addendum)
You were evaluated in the Emergency Department and after careful evaluation, we did not find any emergent condition requiring admission or further testing in the hospital.  Your symptoms today seem to be due to bleeding from the nose.  Your testing today was overall reassuring.  If you continue to feel the sensation of something in your throat, we recommend ENT follow-up.  Please return to the Emergency Department if you experience any worsening of your condition.  We encourage you to follow up with a primary care provider.  Thank you for allowing [place] to be a part of your care.

## 2019-06-18 NOTE — ED Notes (Signed)
Patient transported to CT 

## 2019-06-19 NOTE — Telephone Encounter (Signed)
Please come to see Beth Miller.  Follow our usual protocols Thanks

## 2019-06-21 ENCOUNTER — Other Ambulatory Visit (INDEPENDENT_AMBULATORY_CARE_PROVIDER_SITE_OTHER): Payer: Medicare HMO

## 2019-06-21 ENCOUNTER — Other Ambulatory Visit: Payer: Self-pay | Admitting: General Practice

## 2019-06-21 DIAGNOSIS — I4821 Permanent atrial fibrillation: Secondary | ICD-10-CM | POA: Diagnosis not present

## 2019-06-21 DIAGNOSIS — Z7901 Long term (current) use of anticoagulants: Secondary | ICD-10-CM | POA: Diagnosis not present

## 2019-06-21 LAB — PROTIME-INR
INR: 1.3 ratio — ABNORMAL HIGH (ref 0.8–1.0)
Prothrombin Time: 14.7 s — ABNORMAL HIGH (ref 9.6–13.1)

## 2019-07-30 ENCOUNTER — Ambulatory Visit: Payer: Medicare HMO

## 2019-08-24 DIAGNOSIS — Z1159 Encounter for screening for other viral diseases: Secondary | ICD-10-CM | POA: Diagnosis not present

## 2019-08-25 ENCOUNTER — Other Ambulatory Visit: Payer: Self-pay

## 2019-08-25 MED ORDER — MEMANTINE HCL 5 MG PO TABS: 5.0000 mg | ORAL_TABLET | Freq: Two times a day (BID) | ORAL | 1 refills | Status: DC

## 2019-08-30 ENCOUNTER — Telehealth: Payer: Self-pay | Admitting: Internal Medicine

## 2019-08-30 NOTE — Telephone Encounter (Signed)
Please advise, I am not showing patient on this medication

## 2019-08-30 NOTE — Telephone Encounter (Signed)
Pharmacist requesting 90 day supply myrbetriq 25 mg, informed please allow 48 to 72 hour turn around time  [place], [place]

## 2019-08-31 MED ORDER — MIRABEGRON ER 25 MG PO TB24: 25.0000 mg | ORAL_TABLET | Freq: Every day | ORAL | 3 refills | Status: DC

## 2019-08-31 NOTE — Telephone Encounter (Signed)
Ok Thx 

## 2019-09-16 ENCOUNTER — Other Ambulatory Visit: Payer: Self-pay

## 2019-09-16 ENCOUNTER — Ambulatory Visit (INDEPENDENT_AMBULATORY_CARE_PROVIDER_SITE_OTHER): Payer: Medicare HMO | Admitting: Internal Medicine

## 2019-09-16 ENCOUNTER — Other Ambulatory Visit (INDEPENDENT_AMBULATORY_CARE_PROVIDER_SITE_OTHER): Payer: Medicare HMO

## 2019-09-16 ENCOUNTER — Encounter: Payer: Self-pay | Admitting: Internal Medicine

## 2019-09-16 VITALS — BP 114/68 | HR 109 | Temp 98.1°F | Ht 62.0 in | Wt 190.0 lb

## 2019-09-16 DIAGNOSIS — I10 Essential (primary) hypertension: Secondary | ICD-10-CM

## 2019-09-16 DIAGNOSIS — Z7901 Long term (current) use of anticoagulants: Secondary | ICD-10-CM | POA: Diagnosis not present

## 2019-09-16 DIAGNOSIS — E119 Type 2 diabetes mellitus without complications: Secondary | ICD-10-CM

## 2019-09-16 DIAGNOSIS — E559 Vitamin D deficiency, unspecified: Secondary | ICD-10-CM

## 2019-09-16 DIAGNOSIS — I482 Chronic atrial fibrillation, unspecified: Secondary | ICD-10-CM | POA: Diagnosis not present

## 2019-09-16 DIAGNOSIS — I4891 Unspecified atrial fibrillation: Secondary | ICD-10-CM | POA: Diagnosis not present

## 2019-09-16 DIAGNOSIS — Z0000 Encounter for general adult medical examination without abnormal findings: Secondary | ICD-10-CM

## 2019-09-16 LAB — CBC WITH DIFFERENTIAL/PLATELET
Basophils Absolute: 0 10*3/uL (ref 0.0–0.1)
Basophils Relative: 0.8 % (ref 0.0–3.0)
Eosinophils Absolute: 0.1 10*3/uL (ref 0.0–0.7)
Eosinophils Relative: 1.2 % (ref 0.0–5.0)
HCT: 32.7 % — ABNORMAL LOW (ref 36.0–46.0)
Hemoglobin: 10.9 g/dL — ABNORMAL LOW (ref 12.0–15.0)
Lymphocytes Relative: 28 % (ref 12.0–46.0)
Lymphs Abs: 1.7 10*3/uL (ref 0.7–4.0)
MCHC: 33.3 g/dL (ref 30.0–36.0)
MCV: 82.6 fl (ref 78.0–100.0)
Monocytes Absolute: 0.9 10*3/uL (ref 0.1–1.0)
Monocytes Relative: 14 % — ABNORMAL HIGH (ref 3.0–12.0)
Neutro Abs: 3.5 10*3/uL (ref 1.4–7.7)
Neutrophils Relative %: 56 % (ref 43.0–77.0)
Platelets: 278 10*3/uL (ref 150.0–400.0)
RBC: 3.96 Mil/uL (ref 3.87–5.11)
RDW: 15.7 % — ABNORMAL HIGH (ref 11.5–15.5)
WBC: 6.2 10*3/uL (ref 4.0–10.5)

## 2019-09-16 LAB — HEPATIC FUNCTION PANEL
ALT: 17 U/L (ref 0–35)
AST: 21 U/L (ref 0–37)
Albumin: 4.1 g/dL (ref 3.5–5.2)
Alkaline Phosphatase: 72 U/L (ref 39–117)
Bilirubin, Direct: 0.1 mg/dL (ref 0.0–0.3)
Total Bilirubin: 0.5 mg/dL (ref 0.2–1.2)
Total Protein: 7.1 g/dL (ref 6.0–8.3)

## 2019-09-16 LAB — BASIC METABOLIC PANEL
BUN: 15 mg/dL (ref 6–23)
CO2: 30 mEq/L (ref 19–32)
Calcium: 9.6 mg/dL (ref 8.4–10.5)
Chloride: 104 mEq/L (ref 96–112)
Creatinine, Ser: 1.01 mg/dL (ref 0.40–1.20)
GFR: 63.8 mL/min (ref >60.00)
Glucose, Bld: 172 mg/dL — ABNORMAL HIGH (ref 70–99)
Potassium: 4 mEq/L (ref 3.5–5.1)
Sodium: 141 mEq/L (ref 135–145)

## 2019-09-16 LAB — TSH: TSH: 4.32 u[IU]/mL (ref 0.35–4.50)

## 2019-09-16 LAB — VITAMIN D 25 HYDROXY (VIT D DEFICIENCY, FRACTURES): VITD: 40.81 ng/mL (ref 30.00–100.00)

## 2019-09-16 LAB — HEMOGLOBIN A1C: Hgb A1c MFr Bld: 8.7 % — ABNORMAL HIGH (ref 4.6–6.5)

## 2019-09-16 MED ORDER — DIGOXIN 250 MCG PO TABS: 250.0000 ug | ORAL_TABLET | Freq: Every day | ORAL | 3 refills | Status: DC

## 2019-09-16 MED ORDER — WARFARIN SODIUM 5 MG PO TABS: ORAL_TABLET | ORAL | 3 refills | Status: DC

## 2019-09-16 NOTE — Assessment & Plan Note (Signed)
On Coumadin 

## 2019-09-16 NOTE — Assessment & Plan Note (Signed)
Furosemide, [place] 

## 2019-09-16 NOTE — Assessment & Plan Note (Signed)
Vit D 

## 2019-09-16 NOTE — Assessment & Plan Note (Signed)
Metformin -  re-start Prandin tid ac

## 2019-09-16 NOTE — Progress Notes (Signed)
Subjective:  Patient ID: Beth Miller, female    DOB: 08-Jan-1941  Age: 78 y.o. MRN: [id number]  CC: No chief complaint on file.   HPI Beth Miller presents for DM, HTN, B12 def Not taking Metformin due to "news of cancer". CBG >200  Outpatient Medications Prior to Visit  Medication Sig Dispense Refill   ACCU-CHEK SOFTCLIX LANCETS lancets 1 each by Other route 2 (two) times daily. Use to check blood sugars twice a day 200 each 3   acetaminophen (TYLENOL) 500 MG tablet Take 1,000 mg by mouth every 6 (six) hours as needed for mild pain.     Alcohol Swabs (B-D SINGLE USE SWABS REGULAR) PADS 1 each by Does not apply route 2 (two) times daily. Use to clean finger to check blood sugars twice a day 200 each 3   amoxicillin (AMOXIL) 500 MG tablet TAKE FOUR TABLETS ONE HOUR PRIOR TO DENTAL PROCEDURE 12 tablet 1   Blood Glucose Calibration (ACCU-CHEK AVIVA) SOLN 1 each by In Vitro route as directed. 3 each 1   Blood Glucose Monitoring Suppl (ACCU-CHEK AVIVA PLUS) w/Device KIT 1 each by Does not apply route 2 (two) times daily. Use as directed 1 kit 0   Cholecalciferol (VITAMIN D3) 1000 UNIT tablet Take 1,000 Units by mouth daily.       Continuous Blood Gluc Receiver (FREESTYLE LIBRE 14 DAY READER) DEVI 1 Units by Does not apply route daily. 30 Units 11   Continuous Blood Gluc Receiver (FREESTYLE LIBRE 2 READER SYSTM) DEVI 1 Device by Does not apply route daily. 1 Units 0   digoxin (LANOXIN) 0.25 MG tablet TAKE 1 TABLET AT BEDTIME 90 tablet 3   diltiazem (CARDIZEM CD) 180 MG 24 hr capsule Take 1 capsule (180 mg total) by mouth daily. Take in am 90 capsule 3   furosemide (LASIX) 40 MG tablet Take 1 tablet (40 mg total) by mouth daily. Take in am 90 tablet 3   gabapentin (NEURONTIN) 100 MG capsule Take 1 capsule (100 mg total) by mouth at bedtime. For cramps 90 capsule 3   glucose blood (ACCU-CHEK AVIVA PLUS) test strip 1 each by Other route 2 (two) times daily. Use to check blood  sugars twice a day 200 each 3   Lancet Devices (ACCU-CHEK SOFTCLIX) lancets 1 each by Other route 2 (two) times daily. Use as instructed 1 each 5   loratadine (CLARITIN) 10 MG tablet Take 1 tablet (10 mg total) by mouth daily as needed for allergies. Take in am 100 tablet 3   memantine (NAMENDA) 5 MG tablet Take 1 tablet (5 mg total) by mouth 2 (two) times daily. 180 tablet 1   metFORMIN (GLUCOPHAGE) 500 MG tablet Take 1 tablet (500 mg total) by mouth 2 (two) times daily with a meal. 180 tablet 3   mirabegron ER (MYRBETRIQ) 25 MG TB24 tablet Take 1 tablet (25 mg total) by mouth daily. 90 tablet 3   ondansetron (ZOFRAN) 4 MG tablet Take 1 tablet (4 mg total) by mouth every 8 (eight) hours as needed for nausea or vomiting. 20 tablet 0   OVER THE COUNTER MEDICATION Take 1 tablet by mouth as needed (joint pain).      pantoprazole (PROTONIX) 40 MG tablet Take 1 tablet (40 mg total) by mouth daily. Take in am 90 tablet 3   potassium chloride SA (KLOR-CON M20) 20 MEQ tablet Take 1 tablet (20 mEq total) by mouth daily. Take in am 90 tablet 3   repaglinide (  PRANDIN) 1 MG tablet Take 1 tablet (1 mg total) by mouth 3 (three) times daily before meals. 270 tablet 3   vitamin B-12 (CYANOCOBALAMIN) 1000 MCG tablet Take 1,000 mcg by mouth daily as needed (energy).     warfarin (COUMADIN) 10 MG tablet TAKE 1 TABLET DAILY OR AS DIRECTED BY ANTICOAGULATION CLINIC. 105 tablet 1   warfarin (COUMADIN) 5 MG tablet TAKE AS DIRECTED 40 tablet 1   No facility-administered medications prior to visit.     ROS: Review of Systems  Constitutional: Positive for fatigue. Negative for activity change, appetite change, chills and unexpected weight change.  HENT: Negative for congestion, mouth sores and sinus pressure.   Eyes: Negative for visual disturbance.  Respiratory: Negative for cough, chest tightness and shortness of breath.   Cardiovascular: Negative for chest pain.  Gastrointestinal: Negative for  abdominal pain and nausea.  Genitourinary: Negative for difficulty urinating, frequency and vaginal pain.  Musculoskeletal: Negative for back pain and gait problem.  Skin: Negative for pallor and rash.  Neurological: Negative for dizziness, tremors, weakness, numbness and headaches.  Psychiatric/Behavioral: Negative for confusion and sleep disturbance.    Objective:  BP 114/68 (BP Location: Left Arm, Patient Position: Sitting, Cuff Size: Large)    Pulse (!) 109    Temp 98.1 F (36.7 C) (Oral)    Ht 5' 2"  (1.575 m)    Wt 190 lb (86.2 kg)    LMP  (LMP Unknown)    SpO2 96%    BMI 34.75 kg/m   BP Readings from Last 3 Encounters:  09/16/19 114/68  06/18/19 126/66  06/16/19 108/70    Wt Readings from Last 3 Encounters:  09/16/19 190 lb (86.2 kg)  06/16/19 188 lb (85.3 kg)  04/26/19 188 lb (85.3 kg)    Physical Exam Constitutional:      General: She is not in acute distress.    Appearance: She is well-developed.  HENT:     Head: Normocephalic.     Right Ear: External ear normal.     Left Ear: External ear normal.     Nose: Nose normal.  Eyes:     General:        Right eye: No discharge.        Left eye: No discharge.     Conjunctiva/sclera: Conjunctivae normal.     Pupils: Pupils are equal, round, and reactive to light.  Neck:     Musculoskeletal: Normal range of motion and neck supple.     Thyroid: No thyromegaly.     Vascular: No JVD.     Trachea: No tracheal deviation.  Cardiovascular:     Rate and Rhythm: Regular rhythm. Tachycardia present.     Heart sounds: Normal heart sounds.  Pulmonary:     Effort: No respiratory distress.     Breath sounds: No stridor. No wheezing.  Abdominal:     General: Bowel sounds are normal. There is no distension.     Palpations: Abdomen is soft. There is no mass.     Tenderness: There is no abdominal tenderness. There is no guarding or rebound.  Musculoskeletal:        General: No tenderness.  Lymphadenopathy:     Cervical: No  cervical adenopathy.  Skin:    Findings: No erythema or rash.  Neurological:     Cranial Nerves: No cranial nerve deficit.     Motor: No abnormal muscle tone.     Coordination: Coordination normal.     Deep Tendon Reflexes: Reflexes  normal.  Psychiatric:        Behavior: Behavior normal.        Thought Content: Thought content normal.        Judgment: Judgment normal.     Lab Results  Component Value Date   WBC 5.5 06/18/2019   HGB 11.2 (L) 06/18/2019   HCT 33.4 (L) 06/18/2019   PLT 285 06/18/2019   GLUCOSE 188 (H) 06/18/2019   CHOL 106 04/27/2019   TRIG 177.0 (H) 04/27/2019   HDL 37.40 (L) 04/27/2019   LDLCALC 33 04/27/2019   ALT 15 04/27/2019   AST 24 04/27/2019   NA 138 06/18/2019   K 4.1 06/18/2019   CL 101 06/18/2019   CREATININE 1.13 (H) 06/18/2019   BUN 12 06/18/2019   CO2 28 06/18/2019   TSH 3.01 04/27/2019   INR 1.3 (H) 06/21/2019   HGBA1C 7.8 (H) 04/27/2019    Ct Soft Tissue Neck W Contrast  Result Date: 06/18/2019 CLINICAL DATA:  Globus sensation, blood in back of throat. EXAM: CT NECK WITH CONTRAST TECHNIQUE: Multidetector CT imaging of the neck was performed using the standard protocol following the bolus administration of intravenous contrast. CONTRAST:  34m OMNIPAQUE IOHEXOL 300 MG/ML  SOLN COMPARISON:  Cervical spine CT 07/08/2014 FINDINGS: Pharynx and larynx: No appreciable mass or swelling. Poor dentition with multiple absent teeth. Salivary glands: No evidence of inflammation, mass or stone. Thyroid: Asymmetric enlargement of the left thyroid lobe. There is also heterogeneous enhancement throughout the majority of the left thyroid lobe with suggestion of multiple nodules (difficult to individually delineate). Lymph nodes: No pathologically enlarged cervical chain lymph nodes. Vascular: Ectatic ascending thoracic aorta and arch measuring 3.5 cm in diameter. Atherosclerotic calcification within the imaged aortic arch. The carotid and vertebral arteries are  patent within the neck without high-grade stenosis. Limited intracranial: Unremarkable Visualized orbits: The imaged globes and orbits are unremarkable. Mastoids and visualized paranasal sinuses: Small mucous retention cyst within the left maxillary sinus. No significant mastoid effusion. Skeleton: Cervical spondylosis with multilevel posterior disc osteophyte complexes, bony spinal canal stenosis and neural foraminal narrowing. No suspicious lytic or blastic osseous lesions. Sternal cerclage wires. Upper chest: Incompletely assessed nonspecific mediastinal lymphadenopathy. A pretracheal lymph node measuring 14 mm in short axis. IMPRESSION: No appreciable mass or swelling within the pharynx. Enlarged and heterogeneously enhancing left thyroid lobe with suggestion of multiple underlying poorly delineated nodules. Correlate with findings on prior thyroid ultrasound 10/15/2013 and consider repeat thyroid ultrasound as clinically warranted. Incompletely assessed nonspecific mediastinal lymphadenopathy. Ectatic visualized ascending thoracic aorta and arch measuring up to 3.5 cm in diameter. Electronically Signed   By: KKellie Simmering  On: 06/18/2019 14:58    Assessment & Plan:   There are no diagnoses linked to this encounter.   No orders of the defined types were placed in this encounter.    Follow-up: No follow-ups on file.  AWalker Kehr MD

## 2019-09-17 LAB — DIGOXIN LEVEL: Digoxin Level: 0.7 mcg/L — ABNORMAL LOW (ref 0.8–2.0)

## 2019-11-24 ENCOUNTER — Other Ambulatory Visit: Payer: Self-pay | Admitting: Cardiovascular Disease

## 2019-11-24 DIAGNOSIS — Z9889 Other specified postprocedural states: Secondary | ICD-10-CM

## 2019-11-25 NOTE — Telephone Encounter (Signed)
Ok to send script in

## 2020-01-19 DIAGNOSIS — Z03818 Encounter for observation for suspected exposure to other biological agents ruled out: Secondary | ICD-10-CM | POA: Diagnosis not present

## 2020-03-17 ENCOUNTER — Other Ambulatory Visit: Payer: Self-pay | Admitting: Internal Medicine

## 2020-04-12 NOTE — Progress Notes (Signed)
Patient ID: Beth Miller, female   DOB: [birth year], 79 y.o.   MRN: [id number]    79 y.o. seen for f/u MVR  Also has history of chronic afib, previous smoker with COPD, DM PVD with carotid disease No documented CAD last myovue non ischemic March 2014  EF 50-55% by TTE 07/03/16.  Needs updated carotid with non obstructive plaque by [place] 07/06/15  She seems to do better with calcium blockers rather than beta blockers for rate control of her afib  No longer bowling since COVID Husband has stage 4 prostate cancer He is 6 but still working  Needs to see dentist. 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  All other systems reviewed and negative  General: Vitals:   04/17/20 1409  Weight: 185 lb (83.9 kg)  Height: 5' 2"  (1.575 m)     Affect appropriate Healthy:  Black female  HEENT: normal Neck supple with no adenopathy JVP normal no bruits no thyromegaly Lungs clear with no wheezing and good diaphragmatic motion Heart:  S1 click /S2 no MR  murmur, no rub, gallop or click PMI normal Abdomen: benighn, BS positve, no tenderness, no AAA no bruit.  No HSM or HJR Distal pulses intact with no bruits No edema Neuro non-focal Skin warm and dry No muscular weakness   Current Outpatient Medications  Medication Sig Dispense Refill  . ACCU-CHEK SOFTCLIX LANCETS lancets 1 each by Other route 2 (two) times daily. Use to check blood sugars twice a day 200 each 3  . acetaminophen (TYLENOL) 500 MG tablet Take 1,000 mg  by mouth every 6 (six) hours as needed for mild pain.    . Alcohol Swabs (B-D SINGLE USE SWABS REGULAR) PADS 1 each by Does not apply route 2 (two) times daily. Use to clean finger to check blood sugars twice a day 200 each 3  . amoxicillin (AMOXIL) 500 MG tablet Take four capsules (2,000 mg) one hour prior to all dental visits. 8 tablet 11  . Blood Glucose Calibration (ACCU-CHEK AVIVA) SOLN 1 each by In Vitro route as directed. 3 each 1  . Blood Glucose Monitoring Suppl (ACCU-CHEK AVIVA PLUS) w/Device KIT 1 each by Does not apply route 2 (two) times daily. Use as directed 1 kit 0  . Cholecalciferol (VITAMIN D3) 1000 UNIT tablet Take 1,000 Units by mouth daily.      . Continuous Blood Gluc Receiver (FREESTYLE LIBRE 14 DAY READER) DEVI 1 Units by Does not apply route daily. 30 Units 11  . Continuous Blood Gluc Receiver (FREESTYLE LIBRE 2 READER SYSTM) DEVI 1 Device by Does not apply route daily. 1 Units 0  . digoxin (LANOXIN) 0.25 MG tablet Take 1 tablet (250 mcg total) by mouth at bedtime. 90 tablet 3  . diltiazem (CARDIZEM CD) 180 MG 24 hr capsule Take 1 capsule (180 mg total) by mouth daily. Take in am 90 capsule 3  . furosemide (LASIX) 40 MG tablet Take 1 tablet (40 mg total) by mouth daily. Take in am 90 tablet 3  . gabapentin (  NEURONTIN) 100 MG capsule Take 1 capsule (100 mg total) by mouth at bedtime. For cramps 90 capsule 3  . glucose blood (ACCU-CHEK AVIVA PLUS) test strip 1 each by Other route 2 (two) times daily. Use to check blood sugars twice a day 200 each 3  . Lancet Devices (ACCU-CHEK SOFTCLIX) lancets 1 each by Other route 2 (two) times daily. Use as instructed 1 each 5  . loratadine (CLARITIN) 10 MG tablet Take 1 tablet (10 mg total) by mouth daily as needed for allergies. Take in am 100 tablet 3  . memantine (NAMENDA) 5 MG tablet TAKE 1 TABLET TWICE DAILY 180 tablet 3  . metFORMIN (GLUCOPHAGE) 500 MG tablet Take 1 tablet (500 mg total) by mouth 2 (two) times daily with a meal. 180  tablet 3  . mirabegron ER (MYRBETRIQ) 25 MG TB24 tablet Take 1 tablet (25 mg total) by mouth daily. 90 tablet 3  . ondansetron (ZOFRAN) 4 MG tablet Take 1 tablet (4 mg total) by mouth every 8 (eight) hours as needed for nausea or vomiting. 20 tablet 0  . OVER THE COUNTER MEDICATION Take 1 tablet by mouth as needed (joint pain).     . pantoprazole (PROTONIX) 40 MG tablet Take 1 tablet (40 mg total) by mouth daily. Take in am 90 tablet 3  . potassium chloride SA (KLOR-CON M20) 20 MEQ tablet Take 1 tablet (20 mEq total) by mouth daily. Take in am 90 tablet 3  . repaglinide (PRANDIN) 1 MG tablet Take 1 tablet (1 mg total) by mouth 3 (three) times daily before meals. 270 tablet 3  . vitamin B-12 (CYANOCOBALAMIN) 1000 MCG tablet Take 1,000 mcg by mouth daily as needed (energy).    . warfarin (COUMADIN) 10 MG tablet TAKE 1 TABLET DAILY OR AS DIRECTED BY ANTICOAGULATION CLINIC. 105 tablet 1  . warfarin (COUMADIN) 5 MG tablet TAKE AS DIRECTED 90 tablet 3   No current facility-administered medications for this visit.    Allergies  Aricept [donepezil hcl], Food, Furosemide, Quinine, Spironolactone, Tramadol, and Sulfadiazine  Electrocardiogram: 06/18/17 afib rate 87 LVH with strain 04/17/20 afib rate 104 LVH nonspecific ST changes   Assessment and Plan Afib:  Good rate control needs lovenox bridging with afib and mechanical MVR See below  HTN:  Well controlled.  Continue current medications and low sodium Dash type diet.   Carotid: Plaque on duplex September 2016   No bruit  F/u duplex  MVR:  Normal valve click  Echo reviewed 10/30/22 EF low normal normal MVR  F/u echo ordered  DM:  Discussed low carb diet.  Target hemoglobin A1c is 6.5 or less.  Continue current medications. Anticoagulation: long discussion about compliance. No TIA/bleeding issues will check INR today and have Her f/u with coumadin clinic in our office   F/u with me in year  Echo for MVR Carotid US f/u plaque   Jenkins Rouge

## 2020-04-17 ENCOUNTER — Encounter: Payer: Self-pay | Admitting: Cardiovascular Disease

## 2020-04-17 ENCOUNTER — Ambulatory Visit: Payer: Medicare HMO | Admitting: Cardiovascular Disease

## 2020-04-17 ENCOUNTER — Other Ambulatory Visit: Payer: Self-pay

## 2020-04-17 ENCOUNTER — Ambulatory Visit (INDEPENDENT_AMBULATORY_CARE_PROVIDER_SITE_OTHER): Payer: Medicare HMO | Admitting: General Practice

## 2020-04-17 VITALS — BP 110/74 | HR 104 | Ht 62.0 in | Wt 185.0 lb

## 2020-04-17 DIAGNOSIS — Z7901 Long term (current) use of anticoagulants: Secondary | ICD-10-CM

## 2020-04-17 DIAGNOSIS — I4891 Unspecified atrial fibrillation: Secondary | ICD-10-CM

## 2020-04-17 DIAGNOSIS — Z952 Presence of prosthetic heart valve: Secondary | ICD-10-CM | POA: Diagnosis not present

## 2020-04-17 DIAGNOSIS — I779 Disorder of arteries and arterioles, unspecified: Secondary | ICD-10-CM | POA: Diagnosis not present

## 2020-04-17 DIAGNOSIS — Z9889 Other specified postprocedural states: Secondary | ICD-10-CM

## 2020-04-17 DIAGNOSIS — J449 Chronic obstructive pulmonary disease, unspecified: Secondary | ICD-10-CM | POA: Diagnosis not present

## 2020-04-17 DIAGNOSIS — I482 Chronic atrial fibrillation, unspecified: Secondary | ICD-10-CM

## 2020-04-17 LAB — PROTIME-INR
INR: 1.9 — ABNORMAL HIGH (ref 0.9–1.2)
Prothrombin Time: 20.2 s — ABNORMAL HIGH (ref 9.1–12.0)

## 2020-04-17 NOTE — Patient Instructions (Addendum)
Pre visit review using our clinic review tool, if applicable. No additional management support is needed unless otherwise documented below in the visit note.  I was able to speak with patient this morning.  Patient has not been taking the correct dosage of warfarin.  This morning I asked her to start taking 15 mg daily.  I will check her in 1 week. Patient verbalized understanding.

## 2020-04-17 NOTE — Patient Instructions (Addendum)
Medication Instructions:  *If you need a refill on your cardiac medications before your next appointment, please call your pharmacy*  Lab Work: Your physician recommends that you have lab work today-PT/INR  If you have labs (blood work) drawn today and your tests are completely normal, you will receive your results only by:  MyChart Message (if you have MyChart) OR  A paper copy in the mail If you have any lab test that is abnormal or we need to change your treatment, we will call you to review the results.  Testing/Procedures: Your physician has requested that you have an echocardiogram. Echocardiography is a painless test that uses sound waves to create images of your heart. It provides your doctor with information about the size and shape of your heart and how well your hearts chambers and valves are working. This procedure takes approximately one hour. There are no restrictions for this procedure.  Your physician has requested that you have a carotid duplex. This test is an ultrasound of the carotid arteries in your neck. It looks at blood flow through these arteries that supply the brain with blood. Allow one hour for this exam. There are no restrictions or special instructions.  Follow-Up: At [facility], you and your health needs are our priority.  As part of our continuing mission to provide you with exceptional heart care, we have created designated Provider Care Teams.  These Care Teams include your primary Cardiologist (physician) and Advanced Practice Providers (APPs -  Physician Assistants and Nurse Practitioners) who all work together to provide you with the care you need, when you need it.  We recommend signing up for the patient portal called "MyChart".  Sign up information is provided on this After Visit Summary.  MyChart is used to connect with patients for Virtual Visits (Telemedicine).  Patients are able to view lab/test results, encounter notes, upcoming appointments,  etc.  Non-urgent messages can be sent to your provider as well.   To learn more about what you can do with MyChart, go to [email or web address].    Your next appointment:   1 year(s)  The format for your next appointment:   In Person  Provider:   You may see Jenkins Rouge, MD or one of the following Advanced Practice Providers on your designated Care Team:    Truitt Merle, NP  Cecilie Kicks, NP  Kathyrn Drown, NP  Your provider wants you to make a new patient appointment with our Coumadin Clinic at [facility].

## 2020-04-18 ENCOUNTER — Telehealth: Payer: Self-pay | Admitting: Cardiovascular Disease

## 2020-04-18 NOTE — Telephone Encounter (Signed)
Pt got a call but no message. She called our office because she had a visit with Dr. Johnsie Cancel yesterday and had her INR drawn. Pt was contacted by PCP.  Advised patient to contact PCP

## 2020-04-24 ENCOUNTER — Other Ambulatory Visit: Payer: Self-pay

## 2020-04-24 ENCOUNTER — Ambulatory Visit (INDEPENDENT_AMBULATORY_CARE_PROVIDER_SITE_OTHER): Payer: Medicare HMO | Admitting: *Deleted

## 2020-04-24 DIAGNOSIS — Z7901 Long term (current) use of anticoagulants: Secondary | ICD-10-CM

## 2020-04-24 DIAGNOSIS — Z9889 Other specified postprocedural states: Secondary | ICD-10-CM

## 2020-04-24 DIAGNOSIS — I4891 Unspecified atrial fibrillation: Secondary | ICD-10-CM

## 2020-04-24 LAB — POCT INR: INR: 1.9 — AB (ref 2.0–3.0)

## 2020-04-24 NOTE — Patient Instructions (Addendum)
Description   Today take 17.5mg  then start taking 15mg  daily except 17.5mg  on Tuesday, Thursdays, and Saturdays. Recheck in 1 week. Patient verbalized understanding. Coumadin Clinic [phone] Main [phone]

## 2020-04-25 ENCOUNTER — Ambulatory Visit: Payer: Medicare HMO

## 2020-05-03 ENCOUNTER — Other Ambulatory Visit: Payer: Self-pay

## 2020-05-03 ENCOUNTER — Ambulatory Visit (INDEPENDENT_AMBULATORY_CARE_PROVIDER_SITE_OTHER): Payer: Medicare HMO | Admitting: *Deleted

## 2020-05-03 DIAGNOSIS — Z7901 Long term (current) use of anticoagulants: Secondary | ICD-10-CM

## 2020-05-03 DIAGNOSIS — Z9889 Other specified postprocedural states: Secondary | ICD-10-CM | POA: Diagnosis not present

## 2020-05-03 DIAGNOSIS — I4891 Unspecified atrial fibrillation: Secondary | ICD-10-CM

## 2020-05-03 LAB — POCT INR: INR: 5.6 — AB (ref 2.0–3.0)

## 2020-05-03 NOTE — Patient Instructions (Signed)
Description   Do not take any Warfarin today and No Warfarin tomorrow then start taking 15mg  daily except 12.5mg  on Mondays. Recheck in 1 week. Patient verbalized understanding. Coumadin Clinic [phone] Main [phone]

## 2020-05-05 ENCOUNTER — Ambulatory Visit (HOSPITAL_COMMUNITY)
Admission: RE | Admit: 2020-05-05 | Payer: Medicare HMO | Source: Ambulatory Visit | Attending: Cardiovascular Disease | Admitting: Cardiovascular Disease

## 2020-05-10 ENCOUNTER — Other Ambulatory Visit: Payer: Self-pay

## 2020-05-10 ENCOUNTER — Ambulatory Visit (INDEPENDENT_AMBULATORY_CARE_PROVIDER_SITE_OTHER): Payer: Medicare HMO | Admitting: *Deleted

## 2020-05-10 ENCOUNTER — Ambulatory Visit (HOSPITAL_COMMUNITY): Payer: Medicare HMO | Attending: Cardiology

## 2020-05-10 DIAGNOSIS — Z9889 Other specified postprocedural states: Secondary | ICD-10-CM

## 2020-05-10 DIAGNOSIS — I4891 Unspecified atrial fibrillation: Secondary | ICD-10-CM | POA: Diagnosis not present

## 2020-05-10 DIAGNOSIS — Z7901 Long term (current) use of anticoagulants: Secondary | ICD-10-CM

## 2020-05-10 LAB — POCT INR: INR: 3.4 — AB (ref 2.0–3.0)

## 2020-05-10 NOTE — Patient Instructions (Signed)
Description   Continue taking Warfarin 15mg  daily except 12.5mg  on Mondays. Recheck in 2 weeks. Patient verbalized understanding. Coumadin Clinic [phone] Main [phone]

## 2020-05-19 ENCOUNTER — Ambulatory Visit (HOSPITAL_COMMUNITY)
Admission: RE | Admit: 2020-05-19 | Payer: Medicare HMO | Source: Ambulatory Visit | Attending: Cardiovascular Disease | Admitting: Cardiovascular Disease

## 2020-05-23 DIAGNOSIS — E119 Type 2 diabetes mellitus without complications: Secondary | ICD-10-CM | POA: Diagnosis not present

## 2020-05-23 DIAGNOSIS — H26491 Other secondary cataract, right eye: Secondary | ICD-10-CM | POA: Diagnosis not present

## 2020-05-23 DIAGNOSIS — H5319 Other subjective visual disturbances: Secondary | ICD-10-CM | POA: Diagnosis not present

## 2020-05-24 ENCOUNTER — Ambulatory Visit (INDEPENDENT_AMBULATORY_CARE_PROVIDER_SITE_OTHER): Payer: Medicare HMO

## 2020-05-24 ENCOUNTER — Other Ambulatory Visit: Payer: Self-pay

## 2020-05-24 ENCOUNTER — Ambulatory Visit (HOSPITAL_COMMUNITY)
Admission: RE | Admit: 2020-05-24 | Discharge: 2020-05-24 | Disposition: A | Discharge status: Home / Self Care | Payer: Medicare HMO | Source: Ambulatory Visit | Attending: Cardiology | Admitting: Cardiology

## 2020-05-24 DIAGNOSIS — Z9889 Other specified postprocedural states: Secondary | ICD-10-CM

## 2020-05-24 DIAGNOSIS — I779 Disorder of arteries and arterioles, unspecified: Secondary | ICD-10-CM | POA: Diagnosis not present

## 2020-05-24 DIAGNOSIS — I4891 Unspecified atrial fibrillation: Secondary | ICD-10-CM | POA: Diagnosis not present

## 2020-05-24 DIAGNOSIS — Z7901 Long term (current) use of anticoagulants: Secondary | ICD-10-CM | POA: Diagnosis not present

## 2020-05-24 DIAGNOSIS — E119 Type 2 diabetes mellitus without complications: Secondary | ICD-10-CM | POA: Diagnosis not present

## 2020-05-24 DIAGNOSIS — H43813 Vitreous degeneration, bilateral: Secondary | ICD-10-CM | POA: Diagnosis not present

## 2020-05-24 LAB — POCT INR: INR: 1.4 — AB (ref 2.0–3.0)

## 2020-05-24 NOTE — Patient Instructions (Signed)
Take 20 mg today and tomorrow and then continue taking Warfarin 15mg  daily except 12.5mg  on Mondays. Recheck in 2 weeks. Patient verbalized understanding. Coumadin Clinic [phone] Main [phone]

## 2020-05-26 ENCOUNTER — Telehealth: Payer: Self-pay | Admitting: Cardiovascular Disease

## 2020-05-26 NOTE — Telephone Encounter (Signed)
Patient aware of results.

## 2020-05-26 NOTE — Telephone Encounter (Signed)
New Message    Pt is returning call for results   Please call back  

## 2020-06-07 ENCOUNTER — Other Ambulatory Visit: Payer: Self-pay | Admitting: Internal Medicine

## 2020-06-21 ENCOUNTER — Ambulatory Visit: Payer: Medicare HMO

## 2020-06-21 ENCOUNTER — Other Ambulatory Visit: Payer: Self-pay

## 2020-06-21 ENCOUNTER — Telehealth: Payer: Self-pay | Admitting: *Deleted

## 2020-06-21 ENCOUNTER — Ambulatory Visit (INDEPENDENT_AMBULATORY_CARE_PROVIDER_SITE_OTHER): Payer: Medicare HMO | Admitting: Pharmacist

## 2020-06-21 DIAGNOSIS — Z9889 Other specified postprocedural states: Secondary | ICD-10-CM | POA: Diagnosis not present

## 2020-06-21 DIAGNOSIS — I4891 Unspecified atrial fibrillation: Secondary | ICD-10-CM | POA: Diagnosis not present

## 2020-06-21 DIAGNOSIS — Z7901 Long term (current) use of anticoagulants: Secondary | ICD-10-CM | POA: Diagnosis not present

## 2020-06-21 LAB — POCT INR: INR: 3.5 — AB (ref 2.0–3.0)

## 2020-06-21 NOTE — Telephone Encounter (Signed)
Pt missed this morning appt to have her INR checked. Pt rescheduled appt yesterday to this morning so unsure why she missed it. Called pt and there was no answer so left her a message to call back to reschedule as soon as she can. Last INR was 1.4 on 05/24/2020; will await for the pt to call back.

## 2020-06-21 NOTE — Patient Instructions (Signed)
Description   Continue taking Warfarin 15mg  daily except 12.5mg  on Mondays. Recheck in 4 weeks. Patient verbalized understanding. Coumadin Clinic [phone] Main [phone]

## 2020-07-15 ENCOUNTER — Other Ambulatory Visit: Payer: Self-pay | Admitting: Internal Medicine

## 2020-07-19 ENCOUNTER — Other Ambulatory Visit: Payer: Self-pay

## 2020-07-19 ENCOUNTER — Ambulatory Visit (INDEPENDENT_AMBULATORY_CARE_PROVIDER_SITE_OTHER): Payer: Medicare HMO | Admitting: *Deleted

## 2020-07-19 DIAGNOSIS — Z7901 Long term (current) use of anticoagulants: Secondary | ICD-10-CM | POA: Diagnosis not present

## 2020-07-19 DIAGNOSIS — Z5181 Encounter for therapeutic drug level monitoring: Secondary | ICD-10-CM

## 2020-07-19 DIAGNOSIS — I4891 Unspecified atrial fibrillation: Secondary | ICD-10-CM | POA: Diagnosis not present

## 2020-07-19 LAB — POCT INR: INR: 1.8 — AB (ref 2.0–3.0)

## 2020-07-19 NOTE — Patient Instructions (Signed)
Description    Take 20 mg of warfarin today and tomorrow, then continue taking Warfarin 15mg  daily except 12.5mg  on Mondays. Recheck in 2 weeks. Patient verbalized understanding. Coumadin Clinic [phone] Main [phone]

## 2020-08-01 ENCOUNTER — Encounter: Payer: Self-pay | Admitting: Internal Medicine

## 2020-08-01 DIAGNOSIS — E119 Type 2 diabetes mellitus without complications: Secondary | ICD-10-CM | POA: Diagnosis not present

## 2020-08-01 LAB — HM DIABETES EYE EXAM

## 2020-08-02 ENCOUNTER — Ambulatory Visit (INDEPENDENT_AMBULATORY_CARE_PROVIDER_SITE_OTHER): Payer: Medicare HMO | Admitting: *Deleted

## 2020-08-02 ENCOUNTER — Other Ambulatory Visit: Payer: Self-pay

## 2020-08-02 DIAGNOSIS — Z9889 Other specified postprocedural states: Secondary | ICD-10-CM

## 2020-08-02 DIAGNOSIS — Z7901 Long term (current) use of anticoagulants: Secondary | ICD-10-CM

## 2020-08-02 DIAGNOSIS — I4891 Unspecified atrial fibrillation: Secondary | ICD-10-CM

## 2020-08-02 LAB — POCT INR: INR: 2.6 (ref 2.0–3.0)

## 2020-08-02 NOTE — Patient Instructions (Signed)
Description   Continue taking Warfarin 15mg  daily except 12.5mg  on Mondays. Recheck in 3 weeks. Patient verbalized understanding. Coumadin Clinic [phone] Main [phone]

## 2020-08-03 ENCOUNTER — Encounter: Payer: Self-pay | Admitting: Internal Medicine

## 2020-08-03 ENCOUNTER — Ambulatory Visit (INDEPENDENT_AMBULATORY_CARE_PROVIDER_SITE_OTHER): Payer: Medicare HMO | Admitting: Internal Medicine

## 2020-08-03 VITALS — BP 120/68 | HR 82 | Temp 98.7°F | Ht 62.0 in | Wt 177.0 lb

## 2020-08-03 DIAGNOSIS — E559 Vitamin D deficiency, unspecified: Secondary | ICD-10-CM

## 2020-08-03 DIAGNOSIS — Z7901 Long term (current) use of anticoagulants: Secondary | ICD-10-CM | POA: Diagnosis not present

## 2020-08-03 DIAGNOSIS — I4821 Permanent atrial fibrillation: Secondary | ICD-10-CM | POA: Diagnosis not present

## 2020-08-03 DIAGNOSIS — E119 Type 2 diabetes mellitus without complications: Secondary | ICD-10-CM

## 2020-08-03 DIAGNOSIS — Z23 Encounter for immunization: Secondary | ICD-10-CM | POA: Diagnosis not present

## 2020-08-03 MED ORDER — POTASSIUM CHLORIDE CRYS ER 20 MEQ PO TBCR: EXTENDED_RELEASE_TABLET | ORAL | 3 refills | Status: DC

## 2020-08-03 MED ORDER — PANTOPRAZOLE SODIUM 40 MG PO TBEC
40.0000 mg | DELAYED_RELEASE_TABLET | Freq: Every day | ORAL | 3 refills | Status: DC
Start: 2020-08-03 — End: 2021-01-30

## 2020-08-03 MED ORDER — DIGOXIN 250 MCG PO TABS: 250.0000 ug | ORAL_TABLET | Freq: Every day | ORAL | 3 refills | Status: DC

## 2020-08-03 MED ORDER — METFORMIN HCL ER 500 MG PO TB24: 500.0000 mg | ORAL_TABLET | Freq: Every day | ORAL | 3 refills | Status: DC

## 2020-08-03 MED ORDER — METFORMIN HCL 500 MG PO TABS: 500.0000 mg | ORAL_TABLET | Freq: Every day | ORAL | 3 refills | Status: DC

## 2020-08-03 MED ORDER — MIRABEGRON ER 25 MG PO TB24: 25.0000 mg | ORAL_TABLET | Freq: Every day | ORAL | 3 refills | Status: DC

## 2020-08-03 MED ORDER — DILTIAZEM HCL ER COATED BEADS 180 MG PO CP24: 180.0000 mg | ORAL_CAPSULE | Freq: Every day | ORAL | 3 refills | Status: DC

## 2020-08-03 MED ORDER — MEMANTINE HCL 5 MG PO TABS: 5.0000 mg | ORAL_TABLET | Freq: Two times a day (BID) | ORAL | 3 refills | Status: DC

## 2020-08-03 MED ORDER — FUROSEMIDE 40 MG PO TABS: 40.0000 mg | ORAL_TABLET | Freq: Every day | ORAL | 3 refills | Status: DC

## 2020-08-03 MED ORDER — GABAPENTIN 100 MG PO CAPS
100.0000 mg | ORAL_CAPSULE | Freq: Every day | ORAL | 3 refills | Status: DC
Start: 2020-08-03 — End: 2020-08-11

## 2020-08-03 NOTE — Assessment & Plan Note (Signed)
Cartia, Digoxin, Coumadin 

## 2020-08-03 NOTE — Progress Notes (Signed)
flu

## 2020-08-03 NOTE — Assessment & Plan Note (Signed)
Vit D 

## 2020-08-03 NOTE — Assessment & Plan Note (Signed)
Coumadin 

## 2020-08-03 NOTE — Assessment & Plan Note (Signed)
Metformin - re-start   Increase Prandin to 2 mg tid ac

## 2020-08-03 NOTE — Progress Notes (Signed)
Subjective:  Patient ID: Beth Miller, female    DOB: 10/12/1941  Age: 79 y.o. MRN: [id number]  CC: Follow-up   HPI Beth Miller presents for DM. CBGs in 200s in am's. Not taking Metformin F/u cramps, HTN, CHF f/u    Outpatient Medications Prior to Visit  Medication Sig Dispense Refill  . ACCU-CHEK AVIVA PLUS test strip TEST BLOOD SUGAR TWICE DAILY 200 strip 3  . ACCU-CHEK SOFTCLIX LANCETS lancets 1 each by Other route 2 (two) times daily. Use to check blood sugars twice a day 200 each 3  . acetaminophen (TYLENOL) 500 MG tablet Take 1,000 mg by mouth every 6 (six) hours as needed for mild pain.    . Alcohol Swabs (B-D SINGLE USE SWABS REGULAR) PADS 1 each by Does not apply route 2 (two) times daily. Use to clean finger to check blood sugars twice a day 200 each 3  . amoxicillin (AMOXIL) 500 MG tablet Take four capsules (2,000 mg) one hour prior to all dental visits. 8 tablet 11  . Blood Glucose Calibration (ACCU-CHEK AVIVA) SOLN 1 each by In Vitro route as directed. 3 each 1  . Blood Glucose Monitoring Suppl (ACCU-CHEK AVIVA PLUS) w/Device KIT 1 each by Does not apply route 2 (two) times daily. Use as directed 1 kit 0  . Cholecalciferol (VITAMIN D3) 1000 UNIT tablet Take 1,000 Units by mouth daily.      . Continuous Blood Gluc Receiver (FREESTYLE LIBRE 14 DAY READER) DEVI 1 Units by Does not apply route daily. 30 Units 11  . Continuous Blood Gluc Receiver (FREESTYLE LIBRE 2 READER SYSTM) DEVI 1 Device by Does not apply route daily. 1 Units 0  . digoxin (LANOXIN) 0.25 MG tablet Take 1 tablet (250 mcg total) by mouth at bedtime. 90 tablet 3  . diltiazem (CARDIZEM CD) 180 MG 24 hr capsule Take 1 capsule (180 mg total) by mouth daily. Take in am 90 capsule 3  . furosemide (LASIX) 40 MG tablet Take 1 tablet (40 mg total) by mouth daily. Take in am 90 tablet 3  . gabapentin (NEURONTIN) 100 MG capsule Take 1 capsule (100 mg total) by mouth at bedtime. For cramps 90 capsule 3  . Lancet  Devices (ACCU-CHEK SOFTCLIX) lancets 1 each by Other route 2 (two) times daily. Use as instructed 1 each 5  . loratadine (CLARITIN) 10 MG tablet Take 1 tablet (10 mg total) by mouth daily as needed for allergies. Take in am 100 tablet 3  . memantine (NAMENDA) 5 MG tablet TAKE 1 TABLET TWICE DAILY 180 tablet 3  . metFORMIN (GLUCOPHAGE) 500 MG tablet Take 1 tablet (500 mg total) by mouth 2 (two) times daily with a meal. 180 tablet 3  . mirabegron ER (MYRBETRIQ) 25 MG TB24 tablet Take 1 tablet (25 mg total) by mouth daily. 90 tablet 3  . ondansetron (ZOFRAN) 4 MG tablet Take 1 tablet (4 mg total) by mouth every 8 (eight) hours as needed for nausea or vomiting. 20 tablet 0  . OVER THE COUNTER MEDICATION Take 1 tablet by mouth as needed (joint pain).     . pantoprazole (PROTONIX) 40 MG tablet Take 1 tablet (40 mg total) by mouth daily. Take in am 90 tablet 3  . potassium chloride SA (KLOR-CON) 20 MEQ tablet TAKE 1 TABLET EVERY DAY IN THE MORNING 90 tablet 3  . repaglinide (PRANDIN) 1 MG tablet Take 1 tablet (1 mg total) by mouth 3 (three) times daily before meals. 270 tablet  3  . vitamin B-12 (CYANOCOBALAMIN) 1000 MCG tablet Take 1,000 mcg by mouth daily as needed (energy).    . warfarin (COUMADIN) 10 MG tablet TAKE 1 TABLET DAILY OR AS DIRECTED BY ANTICOAGULATION CLINIC. 105 tablet 1  . warfarin (COUMADIN) 5 MG tablet TAKE AS DIRECTED 90 tablet 3   No facility-administered medications prior to visit.    ROS: Review of Systems  Constitutional: Positive for unexpected weight change. Negative for activity change, appetite change, chills and fatigue.  HENT: Negative for congestion, mouth sores and sinus pressure.   Eyes: Positive for visual disturbance.  Respiratory: Negative for cough and chest tightness.   Gastrointestinal: Negative for abdominal pain and nausea.  Genitourinary: Negative for difficulty urinating, frequency and vaginal pain.  Musculoskeletal: Negative for back pain and gait  problem.  Skin: Negative for pallor and rash.  Neurological: Negative for dizziness, tremors, weakness, numbness and headaches.  Psychiatric/Behavioral: Negative for confusion and sleep disturbance.    Objective:  BP 120/68 (BP Location: Left Arm, Patient Position: Sitting, Cuff Size: Normal)   Pulse 82   Temp 98.7 F (37.1 C) (Oral)   Ht $R'5\' 2"'kC$  (1.575 m)   Wt 177 lb (80.3 kg)   LMP  (LMP Unknown)   SpO2 96%   BMI 32.37 kg/m   BP Readings from Last 3 Encounters:  08/03/20 120/68  04/17/20 110/74  09/16/19 114/68    Wt Readings from Last 3 Encounters:  08/03/20 177 lb (80.3 kg)  04/17/20 185 lb (83.9 kg)  09/16/19 190 lb (86.2 kg)    Physical Exam Constitutional:      General: She is not in acute distress.    Appearance: She is well-developed. She is obese.  HENT:     Head: Normocephalic.     Right Ear: External ear normal.     Left Ear: External ear normal.     Nose: Nose normal.  Eyes:     General:        Right eye: No discharge.        Left eye: No discharge.     Conjunctiva/sclera: Conjunctivae normal.     Pupils: Pupils are equal, round, and reactive to light.  Neck:     Thyroid: No thyromegaly.     Vascular: No JVD.     Trachea: No tracheal deviation.  Cardiovascular:     Rate and Rhythm: Normal rate. Rhythm irregular.     Heart sounds: Murmur heard.   Pulmonary:     Effort: No respiratory distress.     Breath sounds: No stridor. No wheezing.  Abdominal:     General: Bowel sounds are normal. There is no distension.     Palpations: Abdomen is soft. There is no mass.     Tenderness: There is no abdominal tenderness. There is no guarding or rebound.  Musculoskeletal:        General: No tenderness.     Cervical back: Normal range of motion and neck supple.  Lymphadenopathy:     Cervical: No cervical adenopathy.  Skin:    Findings: No erythema or rash.  Neurological:     Mental Status: She is oriented to person, place, and time.     Cranial Nerves:  No cranial nerve deficit.     Motor: No abnormal muscle tone.     Coordination: Coordination normal.     Deep Tendon Reflexes: Reflexes normal.  Psychiatric:        Behavior: Behavior normal.  Thought Content: Thought content normal.        Judgment: Judgment normal.     Lab Results  Component Value Date   WBC 6.2 09/16/2019   HGB 10.9 (L) 09/16/2019   HCT 32.7 (L) 09/16/2019   PLT 278.0 09/16/2019   GLUCOSE 172 (H) 09/16/2019   CHOL 106 04/27/2019   TRIG 177.0 (H) 04/27/2019   HDL 37.40 (L) 04/27/2019   LDLCALC 33 04/27/2019   ALT 17 09/16/2019   AST 21 09/16/2019   NA 141 09/16/2019   K 4.0 09/16/2019   CL 104 09/16/2019   CREATININE 1.01 09/16/2019   BUN 15 09/16/2019   CO2 30 09/16/2019   TSH 4.32 09/16/2019   INR 2.6 08/02/2020   HGBA1C 8.7 (H) 09/16/2019    VAS US CAROTID  Result Date: 05/26/2020 Carotid Arterial Duplex Study Indications:       Carotid artery disease and Patient denies any cerebrovascular                    symptoms today. Her only complaint is feeling her heart                    racing at night in bed. Risk Factors:      Hypertension, Diabetes, past history of smoking. Comparison Study:  Prior carotid duplex exam on 07/06/2015 showed highest                    velocities in right mid ICA 46/14 cm/s and left proximal ICA                    54/16 cm/s. Performing Technologist: Salvadore Dom RVT, RDCS (AE), RDMS  Examination Guidelines: A complete evaluation includes B-mode imaging, spectral Doppler, color Doppler, and power Doppler as needed of all accessible portions of each vessel. Bilateral testing is considered an integral part of a complete examination. Limited examinations for reoccurring indications may be performed as noted.  Right Carotid Findings: +----------+--------+--------+--------+------------------+--------+           PSV cm/sEDV cm/sStenosisPlaque DescriptionComments  +----------+--------+--------+--------+------------------+--------+ CCA Prox  131     13                                         +----------+--------+--------+--------+------------------+--------+ CCA Mid   72      18                                         +----------+--------+--------+--------+------------------+--------+ CCA Distal52      14                                         +----------+--------+--------+--------+------------------+--------+ ICA Prox  61      10                                         +----------+--------+--------+--------+------------------+--------+ ICA Mid   69      26      1-39%  tortuous +----------+--------+--------+--------+------------------+--------+ ICA Distal102     34                                         +----------+--------+--------+--------+------------------+--------+ ECA       68      7                                          +----------+--------+--------+--------+------------------+--------+ +----------+--------+-------+----------------+-------------------+           PSV cm/sEDV cmsDescribe        Arm Pressure (mmHG) +----------+--------+-------+----------------+-------------------+ [id number]            Multiphasic, [id number]                 +----------+--------+-------+----------------+-------------------+ +---------+--------+--+--------+-+---------+ VertebralPSV cm/s33EDV cm/s9Antegrade +---------+--------+--+--------+-+---------+  Left Carotid Findings: +----------+--------+--------+--------+------------------+--------+           PSV cm/sEDV cm/sStenosisPlaque DescriptionComments +----------+--------+--------+--------+------------------+--------+ CCA Prox  80      13                                         +----------+--------+--------+--------+------------------+--------+ CCA Mid   75      17                                          +----------+--------+--------+--------+------------------+--------+ CCA Distal58      18                                         +----------+--------+--------+--------+------------------+--------+ ICA Prox  56      15      1-39%   focal and calcific         +----------+--------+--------+--------+------------------+--------+ ICA Mid   49      19                                         +----------+--------+--------+--------+------------------+--------+ ICA Distal72      25                                         +----------+--------+--------+--------+------------------+--------+ ECA       93      11                                         +----------+--------+--------+--------+------------------+--------+ +----------+--------+--------+----------------+-------------------+           PSV cm/sEDV cm/sDescribe        Arm Pressure (mmHG) +----------+--------+--------+----------------+-------------------+ [id number]             Multiphasic, [id number]                 +----------+--------+--------+----------------+-------------------+ +---------+--------+--+--------+--+---------+ VertebralPSV cm/s49EDV cm/s14Antegrade +---------+--------+--+--------+--+---------+   Summary: Right Carotid: Velocities in the right ICA are consistent  with a 1-39% stenosis. Left Carotid: Velocities in the left ICA are consistent with a 1-39% stenosis. Vertebrals:  Bilateral vertebral arteries demonstrate antegrade flow. Subclavians: Normal flow hemodynamics were seen in bilateral subclavian              arteries. *See table(s) above for measurements and observations.  Electronically signed by Kathlyn Sacramento MD on 05/26/2020 at 10:37:23 AM.    Final     Assessment & Plan:    Follow-up: No follow-ups on file.  Walker Kehr, MD

## 2020-08-04 LAB — COMPREHENSIVE METABOLIC PANEL
ALT: 9 U/L (ref 0–35)
AST: 16 U/L (ref 0–37)
Albumin: 4.1 g/dL (ref 3.5–5.2)
Alkaline Phosphatase: 83 U/L (ref 39–117)
BUN: 19 mg/dL (ref 6–23)
CO2: 34 mEq/L — ABNORMAL HIGH (ref 19–32)
Calcium: 9.8 mg/dL (ref 8.4–10.5)
Chloride: 100 mEq/L (ref 96–112)
Creatinine, Ser: 1.14 mg/dL (ref 0.40–1.20)
GFR: 45.06 mL/min — ABNORMAL LOW (ref >60.00)
Glucose, Bld: 187 mg/dL — ABNORMAL HIGH (ref 70–99)
Potassium: 3.7 mEq/L (ref 3.5–5.1)
Sodium: 140 mEq/L (ref 135–145)
Total Bilirubin: 0.4 mg/dL (ref 0.2–1.2)
Total Protein: 7.5 g/dL (ref 6.0–8.3)

## 2020-08-04 LAB — HEMOGLOBIN A1C: Hgb A1c MFr Bld: 8.4 % — ABNORMAL HIGH (ref 4.6–6.5)

## 2020-08-07 ENCOUNTER — Other Ambulatory Visit: Payer: Self-pay | Admitting: Family

## 2020-08-07 MED ORDER — METFORMIN HCL 500 MG PO TABS
500.0000 mg | ORAL_TABLET | Freq: Every day | ORAL | 0 refills | Status: DC
Start: 2020-08-07 — End: 2021-01-11

## 2020-08-08 ENCOUNTER — Ambulatory Visit: Payer: Medicare HMO | Attending: Internal Medicine

## 2020-08-08 ENCOUNTER — Other Ambulatory Visit: Payer: Self-pay | Admitting: Internal Medicine

## 2020-08-08 DIAGNOSIS — Z23 Encounter for immunization: Secondary | ICD-10-CM

## 2020-08-08 NOTE — Progress Notes (Signed)
° °  Covid-19 Vaccination Clinic  Name:  Beth Miller    MRN: [id number] DOB: [birth year]  08/08/2020  Beth Miller was observed post Covid-19 immunization for 15 minutes without incident. She was provided with Vaccine Information Sheet and instruction to access the V-Safe system.   Beth Miller was instructed to call [phone] with any severe reactions post vaccine:  Difficulty breathing   Swelling of face and throat   A fast heartbeat   A bad rash all over body   Dizziness and weakness

## 2020-08-11 ENCOUNTER — Other Ambulatory Visit: Payer: Self-pay | Admitting: *Deleted

## 2020-08-11 MED ORDER — GABAPENTIN 100 MG PO CAPS
100.0000 mg | ORAL_CAPSULE | Freq: Every day | ORAL | 3 refills | Status: DC
Start: 2020-08-11 — End: 2021-01-30

## 2020-09-06 ENCOUNTER — Ambulatory Visit (INDEPENDENT_AMBULATORY_CARE_PROVIDER_SITE_OTHER): Payer: Medicare HMO

## 2020-09-06 ENCOUNTER — Other Ambulatory Visit: Payer: Self-pay

## 2020-09-06 DIAGNOSIS — Z23 Encounter for immunization: Secondary | ICD-10-CM

## 2020-09-18 ENCOUNTER — Other Ambulatory Visit: Payer: Self-pay

## 2020-09-18 ENCOUNTER — Ambulatory Visit (INDEPENDENT_AMBULATORY_CARE_PROVIDER_SITE_OTHER): Payer: Medicare HMO

## 2020-09-18 DIAGNOSIS — Z9889 Other specified postprocedural states: Secondary | ICD-10-CM | POA: Diagnosis not present

## 2020-09-18 DIAGNOSIS — I4891 Unspecified atrial fibrillation: Secondary | ICD-10-CM

## 2020-09-18 DIAGNOSIS — Z7901 Long term (current) use of anticoagulants: Secondary | ICD-10-CM | POA: Diagnosis not present

## 2020-09-18 LAB — POCT INR: INR: 2.6 (ref 2.0–3.0)

## 2020-09-18 NOTE — Patient Instructions (Signed)
Description   Continue on same dosage of Warfarin 15mg  daily except 12.5mg  on Mondays. Recheck in 4 weeks. Patient verbalized understanding. [facility] [phone] Main [phone]

## 2020-10-04 ENCOUNTER — Ambulatory Visit: Payer: Medicare HMO | Admitting: Internal Medicine

## 2020-10-09 ENCOUNTER — Encounter: Payer: Self-pay | Admitting: Internal Medicine

## 2020-10-09 ENCOUNTER — Ambulatory Visit (INDEPENDENT_AMBULATORY_CARE_PROVIDER_SITE_OTHER): Payer: Medicare HMO

## 2020-10-09 ENCOUNTER — Other Ambulatory Visit: Payer: Self-pay

## 2020-10-09 ENCOUNTER — Ambulatory Visit (INDEPENDENT_AMBULATORY_CARE_PROVIDER_SITE_OTHER): Payer: Medicare HMO | Admitting: Internal Medicine

## 2020-10-09 DIAGNOSIS — M25559 Pain in unspecified hip: Secondary | ICD-10-CM

## 2020-10-09 DIAGNOSIS — E119 Type 2 diabetes mellitus without complications: Secondary | ICD-10-CM

## 2020-10-09 DIAGNOSIS — Z7901 Long term (current) use of anticoagulants: Secondary | ICD-10-CM

## 2020-10-09 DIAGNOSIS — M25551 Pain in right hip: Secondary | ICD-10-CM | POA: Diagnosis not present

## 2020-10-09 MED ORDER — METHYLPREDNISOLONE 4 MG PO TBPK: ORAL_TABLET | ORAL | 0 refills | Status: DC

## 2020-10-09 MED ORDER — HYDROCODONE-ACETAMINOPHEN 5-325 MG PO TABS
0.5000 | ORAL_TABLET | Freq: Four times a day (QID) | ORAL | 0 refills | Status: DC | PRN
Start: 2020-10-09 — End: 2021-02-15

## 2020-10-09 NOTE — Progress Notes (Signed)
Subjective:  Patient ID: Beth Miller, female    DOB: 09-03-1941  Age: 79 y.o. MRN: [id number]  CC: Follow-up (3 month f/u)   HPI Beth Miller presents for severe pain in the R hip since this am - can't raise her leg - severe F/u anticoagulation  Outpatient Medications Prior to Visit  Medication Sig Dispense Refill  . ACCU-CHEK AVIVA PLUS test strip TEST BLOOD SUGAR TWICE DAILY 200 strip 3  . ACCU-CHEK SOFTCLIX LANCETS lancets 1 each by Other route 2 (two) times daily. Use to check blood sugars twice a day 200 each 3  . acetaminophen (TYLENOL) 500 MG tablet Take 1,000 mg by mouth every 6 (six) hours as needed for mild pain.    . Alcohol Swabs (B-D SINGLE USE SWABS REGULAR) PADS 1 each by Does not apply route 2 (two) times daily. Use to clean finger to check blood sugars twice a day 200 each 3  . amoxicillin (AMOXIL) 500 MG tablet Take four capsules (2,000 mg) one hour prior to all dental visits. 8 tablet 11  . Blood Glucose Calibration (ACCU-CHEK AVIVA) SOLN 1 each by In Vitro route as directed. 3 each 1  . Blood Glucose Monitoring Suppl (ACCU-CHEK AVIVA PLUS) w/Device KIT 1 each by Does not apply route 2 (two) times daily. Use as directed 1 kit 0  . Cholecalciferol (VITAMIN D3) 1000 UNIT tablet Take 1,000 Units by mouth daily.    . digoxin (LANOXIN) 0.25 MG tablet Take 1 tablet (250 mcg total) by mouth at bedtime. 90 tablet 3  . diltiazem (CARDIZEM CD) 180 MG 24 hr capsule Take 1 capsule (180 mg total) by mouth daily. Take in am 90 capsule 3  . furosemide (LASIX) 40 MG tablet Take 1 tablet (40 mg total) by mouth daily. Take in am 90 tablet 3  . gabapentin (NEURONTIN) 100 MG capsule Take 1 capsule (100 mg total) by mouth at bedtime. 90 capsule 3  . Lancet Devices (ACCU-CHEK SOFTCLIX) lancets 1 each by Other route 2 (two) times daily. Use as instructed 1 each 5  . loratadine (CLARITIN) 10 MG tablet Take 1 tablet (10 mg total) by mouth daily as needed for allergies. Take in am 100  tablet 3  . memantine (NAMENDA) 5 MG tablet Take 1 tablet (5 mg total) by mouth 2 (two) times daily. 180 tablet 3  . metFORMIN (GLUCOPHAGE) 500 MG tablet Take 1 tablet (500 mg total) by mouth daily with breakfast. 90 tablet 0  . mirabegron ER (MYRBETRIQ) 25 MG TB24 tablet Take 1 tablet (25 mg total) by mouth daily. 90 tablet 3  . ondansetron (ZOFRAN) 4 MG tablet Take 1 tablet (4 mg total) by mouth every 8 (eight) hours as needed for nausea or vomiting. 20 tablet 0  . OVER THE COUNTER MEDICATION Take 1 tablet by mouth as needed (joint pain).     . pantoprazole (PROTONIX) 40 MG tablet Take 1 tablet (40 mg total) by mouth daily. Take in am 90 tablet 3  . potassium chloride SA (KLOR-CON) 20 MEQ tablet TAKE 1 TABLET EVERY DAY IN THE MORNING 90 tablet 3  . vitamin B-12 (CYANOCOBALAMIN) 1000 MCG tablet Take 1,000 mcg by mouth daily as needed (energy).    . warfarin (COUMADIN) 10 MG tablet TAKE 1 TABLET DAILY OR AS DIRECTED BY ANTICOAGULATION CLINIC. 105 tablet 1  . warfarin (COUMADIN) 5 MG tablet TAKE AS DIRECTED 90 tablet 3  . Continuous Blood Gluc Receiver (FREESTYLE LIBRE 14 DAY READER) DEVI 1  Units by Does not apply route daily. (Patient not taking: Reported on 10/09/2020) 30 Units 11  . Continuous Blood Gluc Receiver (FREESTYLE LIBRE 2 READER SYSTM) DEVI 1 Device by Does not apply route daily. (Patient not taking: Reported on 10/09/2020) 1 Units 0   No facility-administered medications prior to visit.    ROS: Review of Systems  Constitutional: Negative for activity change, appetite change, chills, fatigue and unexpected weight change.  HENT: Negative for congestion, mouth sores and sinus pressure.   Eyes: Negative for visual disturbance.  Respiratory: Negative for cough and chest tightness.   Gastrointestinal: Negative for abdominal pain and nausea.  Genitourinary: Negative for difficulty urinating, frequency and vaginal pain.  Musculoskeletal: Positive for arthralgias and gait problem.  Negative for back pain.  Skin: Negative for pallor and rash.  Neurological: Negative for dizziness, tremors, weakness, numbness and headaches.  Psychiatric/Behavioral: Negative for confusion and sleep disturbance.    Objective:  BP 122/78 (BP Location: Left Arm)   Pulse (!) 107   Temp 98.1 F (36.7 C) (Oral)   Wt 167 lb (75.8 kg)   LMP  (LMP Unknown)   SpO2 97%   BMI 30.54 kg/m   BP Readings from Last 3 Encounters:  10/09/20 122/78  08/03/20 120/68  04/17/20 110/74    Wt Readings from Last 3 Encounters:  10/09/20 167 lb (75.8 kg)  08/03/20 177 lb (80.3 kg)  04/17/20 185 lb (83.9 kg)    Physical Exam Constitutional:      General: She is not in acute distress.    Appearance: She is well-developed.  HENT:     Head: Normocephalic.     Right Ear: External ear normal.     Left Ear: External ear normal.     Nose: Nose normal.     Mouth/Throat:     Mouth: Oropharynx is clear and moist.  Eyes:     General:        Right eye: No discharge.        Left eye: No discharge.     Conjunctiva/sclera: Conjunctivae normal.     Pupils: Pupils are equal, round, and reactive to light.  Neck:     Thyroid: No thyromegaly.     Vascular: No JVD.     Trachea: No tracheal deviation.  Cardiovascular:     Rate and Rhythm: Normal rate and regular rhythm.     Heart sounds: Normal heart sounds.  Pulmonary:     Effort: No respiratory distress.     Breath sounds: No stridor. No wheezing.  Abdominal:     General: Bowel sounds are normal. There is no distension.     Palpations: Abdomen is soft. There is no mass.     Tenderness: There is no abdominal tenderness. There is no guarding or rebound.  Musculoskeletal:        General: Tenderness present. No edema.     Cervical back: Normal range of motion and neck supple.  Lymphadenopathy:     Cervical: No cervical adenopathy.  Skin:    Findings: No erythema or rash.  Neurological:     Cranial Nerves: No cranial nerve deficit.     Motor: No  abnormal muscle tone.     Coordination: Coordination normal.     Deep Tendon Reflexes: Reflexes normal.  Psychiatric:        Mood and Affect: Mood and affect normal.        Behavior: Behavior normal.        Thought Content: Thought content  normal.        Judgment: Judgment normal.   R hip - pain in the groin w/ROM; stiff  Lab Results  Component Value Date   WBC 6.2 09/16/2019   HGB 10.9 (L) 09/16/2019   HCT 32.7 (L) 09/16/2019   PLT 278.0 09/16/2019   GLUCOSE 187 (H) 08/03/2020   CHOL 106 04/27/2019   TRIG 177.0 (H) 04/27/2019   HDL 37.40 (L) 04/27/2019   LDLCALC 33 04/27/2019   ALT 9 08/03/2020   AST 16 08/03/2020   NA 140 08/03/2020   K 3.7 08/03/2020   CL 100 08/03/2020   CREATININE 1.14 08/03/2020   BUN 19 08/03/2020   CO2 34 (H) 08/03/2020   TSH 4.32 09/16/2019   INR 2.6 09/18/2020   HGBA1C 8.4 (H) 08/03/2020    VAS US CAROTID  Result Date: 05/26/2020 Carotid Arterial Duplex Study Indications:       Carotid artery disease and Patient denies any cerebrovascular                    symptoms today. Her only complaint is feeling her heart                    racing at night in bed. Risk Factors:      Hypertension, Diabetes, past history of smoking. Comparison Study:  Prior carotid duplex exam on 07/06/2015 showed highest                    velocities in right mid ICA 46/14 cm/s and left proximal ICA                    54/16 cm/s. Performing Technologist: Salvadore Dom RVT, RDCS (AE), RDMS  Examination Guidelines: A complete evaluation includes B-mode imaging, spectral Doppler, color Doppler, and power Doppler as needed of all accessible portions of each vessel. Bilateral testing is considered an integral part of a complete examination. Limited examinations for reoccurring indications may be performed as noted.  Right Carotid Findings: +----------+--------+--------+--------+------------------+--------+           PSV cm/sEDV cm/sStenosisPlaque DescriptionComments  +----------+--------+--------+--------+------------------+--------+ CCA Prox  131     13                                         +----------+--------+--------+--------+------------------+--------+ CCA Mid   72      18                                         +----------+--------+--------+--------+------------------+--------+ CCA Distal52      14                                         +----------+--------+--------+--------+------------------+--------+ ICA Prox  61      10                                         +----------+--------+--------+--------+------------------+--------+ ICA Mid   69      26      1-39%  tortuous +----------+--------+--------+--------+------------------+--------+ ICA Distal102     34                                         +----------+--------+--------+--------+------------------+--------+ ECA       68      7                                          +----------+--------+--------+--------+------------------+--------+ +----------+--------+-------+----------------+-------------------+           PSV cm/sEDV cmsDescribe        Arm Pressure (mmHG) +----------+--------+-------+----------------+-------------------+ [id number]            Multiphasic, [id number]                 +----------+--------+-------+----------------+-------------------+ +---------+--------+--+--------+-+---------+ VertebralPSV cm/s33EDV cm/s9Antegrade +---------+--------+--+--------+-+---------+  Left Carotid Findings: +----------+--------+--------+--------+------------------+--------+           PSV cm/sEDV cm/sStenosisPlaque DescriptionComments +----------+--------+--------+--------+------------------+--------+ CCA Prox  80      13                                         +----------+--------+--------+--------+------------------+--------+ CCA Mid   75      17                                          +----------+--------+--------+--------+------------------+--------+ CCA Distal58      18                                         +----------+--------+--------+--------+------------------+--------+ ICA Prox  56      15      1-39%   focal and calcific         +----------+--------+--------+--------+------------------+--------+ ICA Mid   49      19                                         +----------+--------+--------+--------+------------------+--------+ ICA Distal72      25                                         +----------+--------+--------+--------+------------------+--------+ ECA       93      11                                         +----------+--------+--------+--------+------------------+--------+ +----------+--------+--------+----------------+-------------------+           PSV cm/sEDV cm/sDescribe        Arm Pressure (mmHG) +----------+--------+--------+----------------+-------------------+ [id number]             Multiphasic, [id number]                 +----------+--------+--------+----------------+-------------------+ +---------+--------+--+--------+--+---------+ VertebralPSV cm/s49EDV cm/s14Antegrade +---------+--------+--+--------+--+---------+   Summary: Right Carotid: Velocities in the right ICA are consistent  with a 1-39% stenosis. Left Carotid: Velocities in the left ICA are consistent with a 1-39% stenosis. Vertebrals:  Bilateral vertebral arteries demonstrate antegrade flow. Subclavians: Normal flow hemodynamics were seen in bilateral subclavian              arteries. *See table(s) above for measurements and observations.  Electronically signed by Kathlyn Sacramento MD on 05/26/2020 at 10:37:23 AM.    Final     Assessment & Plan:    Walker Kehr, MD

## 2020-10-09 NOTE — Assessment & Plan Note (Signed)
Cautioned re medrol and DM

## 2020-10-09 NOTE — Assessment & Plan Note (Signed)
No NSAIDs 

## 2020-10-09 NOTE — Assessment & Plan Note (Signed)
New R hip pain - ?OA vs other Medrol pack w/caution Tramadol prn X ray Ortho ref if not better

## 2020-10-16 ENCOUNTER — Ambulatory Visit (INDEPENDENT_AMBULATORY_CARE_PROVIDER_SITE_OTHER): Payer: Medicare HMO | Admitting: Pharmacist

## 2020-10-16 ENCOUNTER — Other Ambulatory Visit: Payer: Self-pay

## 2020-10-16 DIAGNOSIS — I4891 Unspecified atrial fibrillation: Secondary | ICD-10-CM | POA: Diagnosis not present

## 2020-10-16 DIAGNOSIS — Z9889 Other specified postprocedural states: Secondary | ICD-10-CM | POA: Diagnosis not present

## 2020-10-16 DIAGNOSIS — Z7901 Long term (current) use of anticoagulants: Secondary | ICD-10-CM

## 2020-10-16 LAB — POCT INR: INR: 2.2 (ref 2.0–3.0)

## 2020-10-16 NOTE — Patient Instructions (Signed)
Description   Take two 10 mg tablets today and tomorrow and then continue on same dosage of Warfarin 15mg  daily except 12.5mg  on Mondays. Recheck in 4 weeks. Patient verbalized understanding. [facility] [phone] Main [phone]

## 2020-10-23 ENCOUNTER — Encounter: Payer: Self-pay | Admitting: Internal Medicine

## 2020-10-23 ENCOUNTER — Ambulatory Visit (INDEPENDENT_AMBULATORY_CARE_PROVIDER_SITE_OTHER): Payer: Medicare HMO | Admitting: Internal Medicine

## 2020-10-23 ENCOUNTER — Other Ambulatory Visit: Payer: Self-pay

## 2020-10-23 DIAGNOSIS — R1084 Generalized abdominal pain: Secondary | ICD-10-CM | POA: Diagnosis not present

## 2020-10-23 DIAGNOSIS — R413 Other amnesia: Secondary | ICD-10-CM

## 2020-10-23 DIAGNOSIS — E559 Vitamin D deficiency, unspecified: Secondary | ICD-10-CM | POA: Diagnosis not present

## 2020-10-23 DIAGNOSIS — Z7901 Long term (current) use of anticoagulants: Secondary | ICD-10-CM

## 2020-10-23 DIAGNOSIS — M25559 Pain in unspecified hip: Secondary | ICD-10-CM

## 2020-10-23 DIAGNOSIS — R109 Unspecified abdominal pain: Secondary | ICD-10-CM | POA: Insufficient documentation

## 2020-10-23 NOTE — Assessment & Plan Note (Signed)
On Vit D 

## 2020-10-23 NOTE — Patient Instructions (Addendum)
[email or web address] for COVID 19 express test test kits $13.98 + shipment  You can try [employer] Mushroom capsules for memory, focus, neuropathy ([employer] or [employer])

## 2020-10-23 NOTE — Assessment & Plan Note (Signed)
Worse Re-start Protonix Risks associated with treatment noncompliance were discussed. Compliance was encouraged. F/u w/Dr Orvan Falconer if not better

## 2020-10-23 NOTE — Assessment & Plan Note (Signed)
Coumadin po  Potential benefits of a long term Coumadin use as well as potential risks  and complications were explained to the patient and were aknowledged.

## 2020-10-23 NOTE — Progress Notes (Signed)
Subjective:  Patient ID: Beth Miller, female    DOB: 1941/03/02  Age: 79 y.o. MRN: [id number]  CC: Follow-up   HPI JANYLA BISCOE presents for R hip pain -pain has resolved after the first given Norco. Has a hint of pain off and on. F/u on DM, anticoagulation C/o abd pain, bloating   Outpatient Medications Prior to Visit  Medication Sig Dispense Refill  . ACCU-CHEK AVIVA PLUS test strip TEST BLOOD SUGAR TWICE DAILY 200 strip 3  . ACCU-CHEK SOFTCLIX LANCETS lancets 1 each by Other route 2 (two) times daily. Use to check blood sugars twice a day 200 each 3  . acetaminophen (TYLENOL) 500 MG tablet Take 1,000 mg by mouth every 6 (six) hours as needed for mild pain.    . Alcohol Swabs (B-D SINGLE USE SWABS REGULAR) PADS 1 each by Does not apply route 2 (two) times daily. Use to clean finger to check blood sugars twice a day 200 each 3  . amoxicillin (AMOXIL) 500 MG tablet Take four capsules (2,000 mg) one hour prior to all dental visits. 8 tablet 11  . digoxin (LANOXIN) 0.25 MG tablet Take 1 tablet (250 mcg total) by mouth at bedtime. 90 tablet 3  . diltiazem (CARDIZEM CD) 180 MG 24 hr capsule Take 1 capsule (180 mg total) by mouth daily. Take in am 90 capsule 3  . furosemide (LASIX) 40 MG tablet Take 1 tablet (40 mg total) by mouth daily. Take in am 90 tablet 3  . gabapentin (NEURONTIN) 100 MG capsule Take 1 capsule (100 mg total) by mouth at bedtime. 90 capsule 3  . HYDROcodone-acetaminophen (NORCO/VICODIN) 5-325 MG tablet Take 0.5-1 tablets by mouth every 6 (six) hours as needed for severe pain. 20 tablet 0  . memantine (NAMENDA) 5 MG tablet Take 1 tablet (5 mg total) by mouth 2 (two) times daily. 180 tablet 3  . metFORMIN (GLUCOPHAGE) 500 MG tablet Take 1 tablet (500 mg total) by mouth daily with breakfast. 90 tablet 0  . mirabegron ER (MYRBETRIQ) 25 MG TB24 tablet Take 1 tablet (25 mg total) by mouth daily. 90 tablet 3  . ondansetron (ZOFRAN) 4 MG tablet Take 1 tablet (4 mg total)  by mouth every 8 (eight) hours as needed for nausea or vomiting. 20 tablet 0  . OVER THE COUNTER MEDICATION Take 1 tablet by mouth as needed (joint pain).     . pantoprazole (PROTONIX) 40 MG tablet Take 1 tablet (40 mg total) by mouth daily. Take in am 90 tablet 3  . potassium chloride SA (KLOR-CON) 20 MEQ tablet TAKE 1 TABLET EVERY DAY IN THE MORNING 90 tablet 3  . vitamin B-12 (CYANOCOBALAMIN) 1000 MCG tablet Take 1,000 mcg by mouth daily as needed (energy).    . warfarin (COUMADIN) 10 MG tablet TAKE 1 TABLET DAILY OR AS DIRECTED BY ANTICOAGULATION CLINIC. 105 tablet 1  . warfarin (COUMADIN) 5 MG tablet TAKE AS DIRECTED 90 tablet 3  . Blood Glucose Calibration (ACCU-CHEK AVIVA) SOLN 1 each by In Vitro route as directed. (Patient not taking: Reported on 10/23/2020) 3 each 1  . Blood Glucose Monitoring Suppl (ACCU-CHEK AVIVA PLUS) w/Device KIT 1 each by Does not apply route 2 (two) times daily. Use as directed (Patient not taking: Reported on 10/23/2020) 1 kit 0  . Cholecalciferol (VITAMIN D3) 1000 UNIT tablet Take 1,000 Units by mouth daily. (Patient not taking: Reported on 10/23/2020)    . Lancet Devices (ACCU-CHEK SOFTCLIX) lancets 1 each by Other route  2 (two) times daily. Use as instructed (Patient not taking: Reported on 10/23/2020) 1 each 5  . loratadine (CLARITIN) 10 MG tablet Take 1 tablet (10 mg total) by mouth daily as needed for allergies. Take in am (Patient not taking: Reported on 10/23/2020) 100 tablet 3  . methylPREDNISolone (MEDROL DOSEPAK) 4 MG TBPK tablet As directed (Patient not taking: Reported on 10/23/2020) 21 tablet 0   No facility-administered medications prior to visit.    ROS: Review of Systems  Constitutional: Negative for activity change, appetite change, chills, fatigue and unexpected weight change.  HENT: Negative for congestion, mouth sores and sinus pressure.   Eyes: Negative for visual disturbance.  Respiratory: Negative for cough and chest tightness.    Gastrointestinal: Negative for abdominal pain and nausea.  Genitourinary: Negative for difficulty urinating, frequency and vaginal pain.  Musculoskeletal: Positive for arthralgias. Negative for back pain and gait problem.  Skin: Negative for pallor and rash.  Neurological: Negative for dizziness, tremors, weakness, numbness and headaches.  Psychiatric/Behavioral: Negative for confusion and sleep disturbance.    Objective:  BP 110/64   Pulse (!) 45   Temp 98.3 F (36.8 C) (Oral)   Ht 5' 2"  (1.575 m)   Wt 170 lb (77.1 kg)   LMP  (LMP Unknown)   SpO2 96%   BMI 31.09 kg/m   BP Readings from Last 3 Encounters:  10/23/20 110/64  10/09/20 122/78  08/03/20 120/68    Wt Readings from Last 3 Encounters:  10/23/20 170 lb (77.1 kg)  10/09/20 167 lb (75.8 kg)  08/03/20 177 lb (80.3 kg)    Physical Exam Constitutional:      General: She is not in acute distress.    Appearance: She is well-developed.  HENT:     Head: Normocephalic.     Right Ear: External ear normal.     Left Ear: External ear normal.     Nose: Nose normal.     Mouth/Throat:     Mouth: Oropharynx is clear and moist.  Eyes:     General:        Right eye: No discharge.        Left eye: No discharge.     Conjunctiva/sclera: Conjunctivae normal.     Pupils: Pupils are equal, round, and reactive to light.  Neck:     Thyroid: No thyromegaly.     Vascular: No JVD.     Trachea: No tracheal deviation.  Cardiovascular:     Rate and Rhythm: Normal rate and regular rhythm.     Heart sounds: Normal heart sounds.  Pulmonary:     Effort: No respiratory distress.     Breath sounds: No stridor. No wheezing.  Abdominal:     General: Bowel sounds are normal. There is no distension.     Palpations: Abdomen is soft. There is no mass.     Tenderness: There is no abdominal tenderness. There is no guarding or rebound.  Musculoskeletal:        General: No tenderness or edema.     Cervical back: Normal range of motion and  neck supple.  Lymphadenopathy:     Cervical: No cervical adenopathy.  Skin:    Findings: No erythema or rash.  Neurological:     Mental Status: She is oriented to person, place, and time.     Cranial Nerves: No cranial nerve deficit.     Motor: No abnormal muscle tone.     Coordination: Coordination normal.     Deep Tendon  Reflexes: Reflexes normal.  Psychiatric:        Mood and Affect: Mood and affect normal.        Behavior: Behavior normal.        Thought Content: Thought content normal.        Judgment: Judgment normal.     Lab Results  Component Value Date   WBC 6.2 09/16/2019   HGB 10.9 (L) 09/16/2019   HCT 32.7 (L) 09/16/2019   PLT 278.0 09/16/2019   GLUCOSE 187 (H) 08/03/2020   CHOL 106 04/27/2019   TRIG 177.0 (H) 04/27/2019   HDL 37.40 (L) 04/27/2019   LDLCALC 33 04/27/2019   ALT 9 08/03/2020   AST 16 08/03/2020   NA 140 08/03/2020   K 3.7 08/03/2020   CL 100 08/03/2020   CREATININE 1.14 08/03/2020   BUN 19 08/03/2020   CO2 34 (H) 08/03/2020   TSH 4.32 09/16/2019   INR 2.2 10/16/2020   HGBA1C 8.4 (H) 08/03/2020    VAS US CAROTID  Result Date: 05/26/2020 Carotid Arterial Duplex Study Indications:       Carotid artery disease and Patient denies any cerebrovascular                    symptoms today. Her only complaint is feeling her heart                    racing at night in bed. Risk Factors:      Hypertension, Diabetes, past history of smoking. Comparison Study:  Prior carotid duplex exam on 07/06/2015 showed highest                    velocities in right mid ICA 46/14 cm/s and left proximal ICA                    54/16 cm/s. Performing Technologist: Salvadore Dom RVT, RDCS (AE), RDMS  Examination Guidelines: A complete evaluation includes B-mode imaging, spectral Doppler, color Doppler, and power Doppler as needed of all accessible portions of each vessel. Bilateral testing is considered an integral part of a complete examination. Limited examinations for  reoccurring indications may be performed as noted.  Right Carotid Findings: +----------+--------+--------+--------+------------------+--------+           PSV cm/sEDV cm/sStenosisPlaque DescriptionComments +----------+--------+--------+--------+------------------+--------+ CCA Prox  131     13                                         +----------+--------+--------+--------+------------------+--------+ CCA Mid   72      18                                         +----------+--------+--------+--------+------------------+--------+ CCA Distal52      14                                         +----------+--------+--------+--------+------------------+--------+ ICA Prox  61      10                                         +----------+--------+--------+--------+------------------+--------+  ICA Mid   69      26      1-39%                     tortuous +----------+--------+--------+--------+------------------+--------+ ICA Distal102     34                                         +----------+--------+--------+--------+------------------+--------+ ECA       68      7                                          +----------+--------+--------+--------+------------------+--------+ +----------+--------+-------+----------------+-------------------+           PSV cm/sEDV cmsDescribe        Arm Pressure (mmHG) +----------+--------+-------+----------------+-------------------+ [id number]            Multiphasic, [id number]                 +----------+--------+-------+----------------+-------------------+ +---------+--------+--+--------+-+---------+ VertebralPSV cm/s33EDV cm/s9Antegrade +---------+--------+--+--------+-+---------+  Left Carotid Findings: +----------+--------+--------+--------+------------------+--------+           PSV cm/sEDV cm/sStenosisPlaque DescriptionComments +----------+--------+--------+--------+------------------+--------+ CCA Prox  80       13                                         +----------+--------+--------+--------+------------------+--------+ CCA Mid   75      17                                         +----------+--------+--------+--------+------------------+--------+ CCA Distal58      18                                         +----------+--------+--------+--------+------------------+--------+ ICA Prox  56      15      1-39%   focal and calcific         +----------+--------+--------+--------+------------------+--------+ ICA Mid   49      19                                         +----------+--------+--------+--------+------------------+--------+ ICA Distal72      25                                         +----------+--------+--------+--------+------------------+--------+ ECA       93      11                                         +----------+--------+--------+--------+------------------+--------+ +----------+--------+--------+----------------+-------------------+           PSV cm/sEDV cm/sDescribe        Arm Pressure (mmHG) +----------+--------+--------+----------------+-------------------+ [id number]  Multiphasic, [id number]                 +----------+--------+--------+----------------+-------------------+ +---------+--------+--+--------+--+---------+ VertebralPSV cm/s49EDV cm/s14Antegrade +---------+--------+--+--------+--+---------+   Summary: Right Carotid: Velocities in the right ICA are consistent with a 1-39% stenosis. Left Carotid: Velocities in the left ICA are consistent with a 1-39% stenosis. Vertebrals:  Bilateral vertebral arteries demonstrate antegrade flow. Subclavians: Normal flow hemodynamics were seen in bilateral subclavian              arteries. *See table(s) above for measurements and observations.  Electronically signed by Kathlyn Sacramento MD on 05/26/2020 at 10:37:23 AM.    Final     Assessment & Plan:     Follow-up: No follow-ups on  file.  Walker Kehr, MD

## 2020-10-23 NOTE — Assessment & Plan Note (Addendum)
MSK - better Norco prn Ortho ref if not better

## 2020-10-23 NOTE — Assessment & Plan Note (Signed)
Try [employer] for [employer]

## 2020-10-24 ENCOUNTER — Other Ambulatory Visit: Payer: Self-pay | Admitting: Internal Medicine

## 2020-10-30 ENCOUNTER — Other Ambulatory Visit: Payer: Self-pay

## 2020-10-30 ENCOUNTER — Ambulatory Visit (INDEPENDENT_AMBULATORY_CARE_PROVIDER_SITE_OTHER): Payer: Medicare HMO | Admitting: Cardiology

## 2020-10-30 DIAGNOSIS — I4891 Unspecified atrial fibrillation: Secondary | ICD-10-CM | POA: Diagnosis not present

## 2020-10-30 DIAGNOSIS — Z7901 Long term (current) use of anticoagulants: Secondary | ICD-10-CM

## 2020-10-30 DIAGNOSIS — Z9889 Other specified postprocedural states: Secondary | ICD-10-CM | POA: Diagnosis not present

## 2020-10-30 LAB — POCT INR: INR: 4.5 — AB (ref 2.0–3.0)

## 2020-10-30 NOTE — Patient Instructions (Signed)
-   skip warfarin tonight - have a serving of greens tonight  - continue on same dosage of Warfarin 15mg  daily except 12.5mg  on Mondays.  - Recheck in 1 week  Patient verbalized understanding. [facility] [phone] Main [phone]

## 2020-11-06 ENCOUNTER — Ambulatory Visit: Payer: Medicare HMO | Admitting: Internal Medicine

## 2020-11-10 NOTE — Progress Notes (Signed)
Patient ID: KESLEE HARRINGTON, female   DOB: 1941/02/10, 80 y.o.   MRN: [id number]    80 y.o. seen for f/u [facility]  Also has history of chronic afib, previous smoker with COPD, DM PVD with carotid disease No documented CAD last myovue non ischemic March 2014  TTE showed normal MV function and normal EF 05/10/20 Carotids With plaque no stenosis    She seems to do better with calcium blockers rather than beta blockers for rate control of her afib  No longer bowling since COVID Husband has stage 4 prostate cancer He is 38 but still working  Needs to see dentist. 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 Junctional rhythm with bigeminny ? Digoxin toxic rhythm  She has no visual changes just chronic poor vision from DM No syncope dyspnea or chest pain    ROS: Denies fever, malais, weight loss, blurry vision, decreased visual acuity, cough, sputum, SOB, hemoptysis, pleuritic pain, palpitaitons, heartburn, abdominal pain, melena, lower extremity edema, claudication, or rash.  All other systems reviewed and negative  General: There were no vitals filed for this visit.   Affect appropriate Healthy:  61 female  HEENT: normal Neck supple with no adenopathy JVP elevated no bruits no thyromegaly Lungs clear with no wheezing and good diaphragmatic motion Heart:  S1 click no MR murmur post sternotomy  Abdomen: benighn, BS positve, no tenderness, no AAA no bruit.  No HSM or HJR Distal pulses intact with no bruits No edema Neuro non-focal Skin warm and dry No muscular weakness   Current Outpatient Medications  Medication Sig Dispense Refill  . ACCU-CHEK AVIVA PLUS test strip TEST BLOOD SUGAR TWICE DAILY 200 strip 3  . ACCU-CHEK SOFTCLIX LANCETS lancets 1 each by Other route 2 (two) times daily. Use to check blood sugars twice a day 200 each 3  . acetaminophen (TYLENOL) 500  MG tablet Take 1,000 mg by mouth every 6 (six) hours as needed for mild pain.    . Alcohol Swabs (B-D SINGLE USE SWABS REGULAR) PADS 1 each by Does not apply route 2 (two) times daily. Use to clean finger to check blood sugars twice a day 200 each 3  . amoxicillin (AMOXIL) 500 MG tablet Take four capsules (2,000 mg) one hour prior to all dental visits. 8 tablet 11  . Blood Glucose Calibration (ACCU-CHEK AVIVA) SOLN 1 each by In Vitro route as directed. (Patient not taking: Reported on 10/23/2020) 3 each 1  . Blood Glucose Monitoring Suppl (ACCU-CHEK AVIVA PLUS) w/Device KIT 1 each by Does not apply route 2 (two) times daily. Use as directed (Patient not taking: Reported on 10/23/2020) 1 kit 0  . Cholecalciferol (VITAMIN D3) 1000 UNIT tablet Take 1,000 Units by mouth daily. (Patient not taking: Reported on 10/23/2020)    . digoxin (LANOXIN) 0.25 MG tablet Take 1 tablet (250 mcg total) by mouth at bedtime. 90 tablet 3  . diltiazem (CARDIZEM CD) 180 MG 24 hr capsule Take 1 capsule (180 mg total) by mouth daily. Take in am 90 capsule 3  . furosemide (LASIX) 40 MG tablet Take 1 tablet (40 mg total) by mouth daily. Take in am 90 tablet 3  . gabapentin (NEURONTIN) 100 MG capsule Take 1 capsule (100 mg total) by mouth at bedtime. 90 capsule 3  . HYDROcodone-acetaminophen (NORCO/VICODIN) 5-325 MG tablet Take 0.5-1 tablets by mouth every  6 (six) hours as needed for severe pain. 20 tablet 0  . Lancet Devices (ACCU-CHEK SOFTCLIX) lancets 1 each by Other route 2 (two) times daily. Use as instructed (Patient not taking: Reported on 10/23/2020) 1 each 5  . loratadine (CLARITIN) 10 MG tablet Take 1 tablet (10 mg total) by mouth daily as needed for allergies. Take in am (Patient not taking: Reported on 10/23/2020) 100 tablet 3  . memantine (NAMENDA) 5 MG tablet Take 1 tablet (5 mg total) by mouth 2 (two) times daily. 180 tablet 3  . metFORMIN (GLUCOPHAGE) 500 MG tablet Take 1 tablet (500 mg total) by mouth daily with  breakfast. 90 tablet 0  . methylPREDNISolone (MEDROL DOSEPAK) 4 MG TBPK tablet As directed (Patient not taking: Reported on 10/23/2020) 21 tablet 0  . mirabegron ER (MYRBETRIQ) 25 MG TB24 tablet Take 1 tablet (25 mg total) by mouth daily. 90 tablet 3  . ondansetron (ZOFRAN) 4 MG tablet Take 1 tablet (4 mg total) by mouth every 8 (eight) hours as needed for nausea or vomiting. 20 tablet 0  . OVER THE COUNTER MEDICATION Take 1 tablet by mouth as needed (joint pain).     . pantoprazole (PROTONIX) 40 MG tablet Take 1 tablet (40 mg total) by mouth daily. Take in am 90 tablet 3  . potassium chloride SA (KLOR-CON) 20 MEQ tablet TAKE 1 TABLET EVERY DAY IN THE MORNING 90 tablet 3  . vitamin B-12 (CYANOCOBALAMIN) 1000 MCG tablet Take 1,000 mcg by mouth daily as needed (energy).    . warfarin (COUMADIN) 10 MG tablet TAKE 1 TABLET DAILY OR AS DIRECTED BY ANTICOAGULATION CLINIC. 105 tablet 1  . warfarin (COUMADIN) 5 MG tablet TAKE AS DIRECTED 90 tablet 3   No current facility-administered medications for this visit.    Allergies  Aricept [donepezil hcl], Food, Furosemide, Quinine, Spironolactone, Tramadol, and Sulfadiazine  Electrocardiogram: 06/18/17 afib rate 87 LVH with strain 04/17/20 afib rate 104 LVH nonspecific ST changes  11/17/2020 Junctional with paired PVCls rate 64 bpm  Assessment and Plan Afib:  ? Dig toxicity regularized ? Junctional with paired PVC;s D/c digoxin check BMET/Level hold cardizem She will f/u with [place] early next week Knows to go to ER for any pre syncope or new symptoms  HTN:  Well controlled.  Continue current medications and low sodium Dash type diet.   Carotid: Plaque on duplex  05/24/20 no stenosis observe  MVR:  Normal valve click  Echo reviewed 7/41/28 normal function EF normal  DM:  Discussed low carb diet.  Target hemoglobin A1c is 6.5 or less.  Continue current medications. Anticoagulation: long discussion about compliance. No TIA/bleeding issues INR 4.5 10/30/20   F/u  with me next week    Jenkins Rouge

## 2020-11-17 ENCOUNTER — Encounter: Payer: Self-pay | Admitting: Cardiovascular Disease

## 2020-11-17 ENCOUNTER — Ambulatory Visit: Payer: Medicare HMO | Admitting: Cardiovascular Disease

## 2020-11-17 ENCOUNTER — Other Ambulatory Visit: Payer: Self-pay

## 2020-11-17 ENCOUNTER — Ambulatory Visit (INDEPENDENT_AMBULATORY_CARE_PROVIDER_SITE_OTHER): Payer: Medicare HMO | Admitting: *Deleted

## 2020-11-17 VITALS — BP 112/64 | HR 54 | Ht 62.0 in | Wt 166.0 lb

## 2020-11-17 DIAGNOSIS — I4891 Unspecified atrial fibrillation: Secondary | ICD-10-CM | POA: Diagnosis not present

## 2020-11-17 DIAGNOSIS — T460X1A Poisoning by cardiac-stimulant glycosides and drugs of similar action, accidental (unintentional), initial encounter: Secondary | ICD-10-CM | POA: Diagnosis not present

## 2020-11-17 DIAGNOSIS — J449 Chronic obstructive pulmonary disease, unspecified: Secondary | ICD-10-CM | POA: Diagnosis not present

## 2020-11-17 DIAGNOSIS — Z7901 Long term (current) use of anticoagulants: Secondary | ICD-10-CM | POA: Diagnosis not present

## 2020-11-17 DIAGNOSIS — Z5181 Encounter for therapeutic drug level monitoring: Secondary | ICD-10-CM

## 2020-11-17 DIAGNOSIS — Z952 Presence of prosthetic heart valve: Secondary | ICD-10-CM | POA: Diagnosis not present

## 2020-11-17 LAB — POCT INR: INR: 5.7 — AB (ref 2.0–3.0)

## 2020-11-17 NOTE — Patient Instructions (Addendum)
Medication Instructions:  Your physician has recommended you make the following change in your medication:   1) Stop Digoxin 0.25 mg 2) Hold Cardizem until follow up on 11/23/20  *If you need a refill on your cardiac medications before your next appointment, please call your pharmacy*  Lab Work: You will have labs drawn today: Digoxin level/BMET  If you have labs (blood work) drawn today and your tests are completely normal, you will receive your results only by:  [place] (if you have MyChart) OR  A paper copy in the mail If you have any lab test that is abnormal or we need to change your treatment, we will call you to review the results.  Testing/Procedures: None ordered today  Follow-Up: On 11/23/20 at 10:45AM with Jenkins Rouge, MD

## 2020-11-17 NOTE — Progress Notes (Signed)
Patient ID: Beth Miller, female   DOB: [birth year], 80 y.o.   MRN: [id number]    79 y.o. seen for f/u MVR  Also has history of chronic afib, previous smoker with COPD, DM PVD with carotid disease No documented CAD last myovue non ischemic March 2014  TTE showed normal MV function and normal EF 05/10/20 Carotids With plaque no stenosis    She seems to do better with calcium blockers rather than beta blockers for rate control of her afib  No longer bowling since COVID Husband has stage 4 prostate cancer He is 59 but still working  Needs to see dentist. 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 Junctional rhythm with bigeminny ? Digoxin toxic rhythm  She has no visual changes just chronic poor vision from DM No syncope dyspnea or chest pain   She was told to hold cardizem and stop digoxin  Labs including BMET and Dig level drawn  Feels 100% better Monitor shows afib rates 70's and no malignant ventricular beats or junctional rhythm   ROS: Denies fever, malais, weight loss, blurry vision, decreased visual acuity, cough, sputum, SOB, hemoptysis, pleuritic pain, palpitaitons, heartburn, abdominal pain, melena, lower extremity edema, claudication, or rash.  All other systems reviewed and negative  General: Vitals:   11/23/20 1057  Weight: 75.3 kg  Height: _0  (1.575 m)     Affect appropriate Healthy:  Black female  HEENT: normal Neck supple with no adenopathy JVP elevated no bruits no thyromegaly Lungs clear with no wheezing and good diaphragmatic motion Heart:  S1 click no MR murmur post sternotomy  Abdomen: benighn, BS positve, no tenderness, no AAA no bruit.  No HSM or HJR Distal pulses intact with no bruits No edema Neuro non-focal Skin warm and dry No muscular weakness   Current Outpatient Medications  Medication Sig Dispense Refill  .  ACCU-CHEK AVIVA PLUS test strip TEST BLOOD SUGAR TWICE DAILY 200 strip 3  . ACCU-CHEK SOFTCLIX LANCETS lancets 1 each by Other route 2 (two) times daily. Use to check blood sugars twice a day 200 each 3  . acetaminophen (TYLENOL) 500 MG tablet Take 1,000 mg by mouth every 6 (six) hours as needed for mild pain.    . Alcohol Swabs (B-D SINGLE USE SWABS REGULAR) PADS 1 each by Does not apply route 2 (two) times daily. Use to clean finger to check blood sugars twice a day 200 each 3  . amoxicillin (AMOXIL) 500 MG tablet Take four capsules (2,000 mg) one hour prior to all dental visits. 8 tablet 11  . Blood Glucose Calibration (ACCU-CHEK AVIVA) SOLN 1 each by In Vitro route as directed. 3 each 1  . Blood Glucose Monitoring Suppl (ACCU-CHEK AVIVA PLUS) w/Device KIT 1 each by Does not apply route 2 (two) times daily. Use as directed 1 kit 0  . Cholecalciferol (VITAMIN D3) 1000 UNIT tablet Take 1,000 Units by mouth daily.    Marland Kitchen diltiazem (CARDIZEM CD) 180 MG 24 hr capsule Take 1 capsule (180 mg total) by mouth daily. Take in am 90 capsule 3  . furosemide (LASIX) 40 MG tablet Take 1 tablet (40 mg total) by mouth daily. Take in am 90 tablet 3  . gabapentin (NEURONTIN) 100 MG capsule Take 1 capsule (100 mg total) by mouth at bedtime. 90 capsule 3  . HYDROcodone-acetaminophen (NORCO/VICODIN) 5-325 MG tablet  Take 0.5-1 tablets by mouth every 6 (six) hours as needed for severe pain. 20 tablet 0  . Lancet Devices (ACCU-CHEK SOFTCLIX) lancets 1 each by Other route 2 (two) times daily. Use as instructed 1 each 5  . loratadine (CLARITIN) 10 MG tablet Take 1 tablet (10 mg total) by mouth daily as needed for allergies. Take in am 100 tablet 3  . memantine (NAMENDA) 5 MG tablet Take 1 tablet (5 mg total) by mouth 2 (two) times daily. 180 tablet 3  . metFORMIN (GLUCOPHAGE) 500 MG tablet Take 1 tablet (500 mg total) by mouth daily with breakfast. 90 tablet 0  . methylPREDNISolone (MEDROL DOSEPAK) 4 MG TBPK tablet As  directed 21 tablet 0  . mirabegron ER (MYRBETRIQ) 25 MG TB24 tablet Take 1 tablet (25 mg total) by mouth daily. 90 tablet 3  . ondansetron (ZOFRAN) 4 MG tablet Take 1 tablet (4 mg total) by mouth every 8 (eight) hours as needed for nausea or vomiting. 20 tablet 0  . OVER THE COUNTER MEDICATION Take 1 tablet by mouth as needed (joint pain).     . pantoprazole (PROTONIX) 40 MG tablet Take 1 tablet (40 mg total) by mouth daily. Take in am 90 tablet 3  . potassium chloride SA (KLOR-CON) 20 MEQ tablet TAKE 1 TABLET EVERY DAY IN THE MORNING 90 tablet 3  . vitamin B-12 (CYANOCOBALAMIN) 1000 MCG tablet Take 1,000 mcg by mouth daily as needed (energy).    . warfarin (COUMADIN) 10 MG tablet TAKE 1 TABLET DAILY OR AS DIRECTED BY ANTICOAGULATION CLINIC. 105 tablet 1  . warfarin (COUMADIN) 5 MG tablet TAKE AS DIRECTED 90 tablet 3   No current facility-administered medications for this visit.    Allergies  Aricept [donepezil hcl], Food, Furosemide, Quinine, Spironolactone, Tramadol, and Sulfadiazine  Electrocardiogram: 06/18/17 afib rate 87 LVH with strain 04/17/20 afib rate 104 LVH nonspecific ST changes  11/23/2020 Junctional with paired PVCls rate 64 bpm 11/23/2020 Dig toxic rhythm gone afib rate 106 nonspecific ST Changes no ventricular arrhythmias   Assessment and Plan Afib:  ? Dig toxicity 11/17/20  regularized ? Junctional with paired PVC;s Now resolved off digoxin and toxicity confirmed With level  3.8 resume cardizem next week ECG with rate 106 today   HTN:  Well controlled.  Continue current medications and low sodium Dash type diet. Decrease lasix/K as azotemic When found to have dig toxicity  Carotid: Plaque on duplex  05/24/20 no stenosis observe   MVR:  Normal valve click  Echo reviewed 4/96/11 normal function EF normal   DM:  Discussed low carb diet.  Target hemoglobin A1c is 6.5 or less.  Continue current medications.  Anticoagulation: long discussion about compliance. No TIA/bleeding  issues INR 4.5 10/30/20   F/u with me  In 3  months    Jenkins Rouge

## 2020-11-17 NOTE — Patient Instructions (Signed)
Description   Hold warfarin today and tomorrow, then start taking warfarin 15mg  daily, except for 10mg  on Sunday, Tuesday and Thursday. Eat a extra serving of greens today.Pt is coming back on 1/27 to see Dr. Johnsie Cancel will check INR then.  [facility] [phone] Main [phone]

## 2020-11-18 ENCOUNTER — Telehealth: Payer: Self-pay | Admitting: Student

## 2020-11-18 LAB — BASIC METABOLIC PANEL
BUN/Creatinine Ratio: 11 — ABNORMAL LOW (ref 12–28)
BUN: 18 mg/dL (ref 8–27)
CO2: 25 mmol/L (ref 20–29)
Calcium: 10 mg/dL (ref 8.7–10.3)
Chloride: 103 mmol/L (ref 96–106)
Creatinine, Ser: 1.64 mg/dL — ABNORMAL HIGH (ref 0.57–1.00)
GFR calc Af Amer: 34 mL/min/{1.73_m2} — ABNORMAL LOW (ref >59)
GFR calc non Af Amer: 29 mL/min/{1.73_m2} — ABNORMAL LOW (ref >59)
Glucose: 135 mg/dL — ABNORMAL HIGH (ref 65–99)
Potassium: 5.1 mmol/L (ref 3.5–5.2)
Sodium: 142 mmol/L (ref 134–144)

## 2020-11-18 LAB — DIGOXIN LEVEL: Digoxin, Serum: 3.8 ng/mL (ref 0.5–0.9)

## 2020-11-18 NOTE — Telephone Encounter (Addendum)
   Received page about critical lab results with Digoxin of 3.8. Reviewed chart. Patient was seen by Dr. Johnsie Cancel yesterday and there was concern for Digoxin toxicity based on EKG that showed junctional rhythm with bigeminy. Looks like Digoxin was discontinued at yesterdays visit.  Tried to call patient to discuss lab result and confirm that she stopped Digoxin but had to leave a voicemail message. Left message asking her to call back. She will need to come back in for lab visit on Tuesday to recheck Digoxin level.  Darreld Mclean, PA-C 11/18/2020 9:17 AM   Tried to call patient again with above results/recommendations. Left another message on her cell phone. Called home number as well but did not get an answer and her mailbox was full.  Darreld Mclean, PA-C 11/18/2020 2:03 PM   Called patient again today to discuss critical Digoxin lab result. I was able to reach her this time. She reports she did stop her Digoxin as instructed. She reports feeling very drained and feeling like she is going to pass out at times. She also reports some vision changes that she describes as "flashing lights" over the last couple months. In addition, she notes some atypical very mild left sided chest pain that only lasts for a second a time. Given vision changes and feelings of near syncope, I discussed patient with Dr. Gasper Sells who recommended patient go to the ED to be evaluated given her age. May need Digoxin binder. Updated patient on this recommendation and she agrees to go to the ED. She states she will have a family member drive her.  Darreld Mclean, PA-C 11/19/2020 5:11 PM

## 2020-11-19 ENCOUNTER — Other Ambulatory Visit: Payer: Self-pay

## 2020-11-19 ENCOUNTER — Emergency Department (HOSPITAL_COMMUNITY)
Admission: EM | Admit: 2020-11-19 | Discharge: 2020-11-20 | Disposition: A | Discharge status: Home / Self Care | Payer: Medicare HMO | Attending: Emergency Medicine | Admitting: Emergency Medicine

## 2020-11-19 ENCOUNTER — Emergency Department (HOSPITAL_COMMUNITY): Payer: Medicare HMO

## 2020-11-19 DIAGNOSIS — R002 Palpitations: Secondary | ICD-10-CM | POA: Diagnosis not present

## 2020-11-19 DIAGNOSIS — Z7984 Long term (current) use of oral hypoglycemic drugs: Secondary | ICD-10-CM | POA: Diagnosis not present

## 2020-11-19 DIAGNOSIS — Z87891 Personal history of nicotine dependence: Secondary | ICD-10-CM | POA: Insufficient documentation

## 2020-11-19 DIAGNOSIS — Z952 Presence of prosthetic heart valve: Secondary | ICD-10-CM | POA: Diagnosis not present

## 2020-11-19 DIAGNOSIS — E119 Type 2 diabetes mellitus without complications: Secondary | ICD-10-CM | POA: Diagnosis not present

## 2020-11-19 DIAGNOSIS — Z7901 Long term (current) use of anticoagulants: Secondary | ICD-10-CM | POA: Diagnosis not present

## 2020-11-19 DIAGNOSIS — R55 Syncope and collapse: Secondary | ICD-10-CM | POA: Diagnosis not present

## 2020-11-19 DIAGNOSIS — I110 Hypertensive heart disease with heart failure: Secondary | ICD-10-CM | POA: Insufficient documentation

## 2020-11-19 DIAGNOSIS — Z79899 Other long term (current) drug therapy: Secondary | ICD-10-CM | POA: Diagnosis not present

## 2020-11-19 DIAGNOSIS — Z20822 Contact with and (suspected) exposure to covid-19: Secondary | ICD-10-CM | POA: Diagnosis not present

## 2020-11-19 DIAGNOSIS — I480 Paroxysmal atrial fibrillation: Secondary | ICD-10-CM | POA: Diagnosis not present

## 2020-11-19 DIAGNOSIS — I509 Heart failure, unspecified: Secondary | ICD-10-CM | POA: Diagnosis not present

## 2020-11-19 DIAGNOSIS — T460X1A Poisoning by cardiac-stimulant glycosides and drugs of similar action, accidental (unintentional), initial encounter: Secondary | ICD-10-CM | POA: Insufficient documentation

## 2020-11-19 DIAGNOSIS — R7889 Finding of other specified substances, not normally found in blood: Secondary | ICD-10-CM | POA: Diagnosis not present

## 2020-11-19 DIAGNOSIS — I499 Cardiac arrhythmia, unspecified: Secondary | ICD-10-CM | POA: Diagnosis present

## 2020-11-19 DIAGNOSIS — R42 Dizziness and giddiness: Secondary | ICD-10-CM | POA: Diagnosis not present

## 2020-11-19 LAB — CBC
HCT: 32.9 % — ABNORMAL LOW (ref 36.0–46.0)
Hemoglobin: 11.3 g/dL — ABNORMAL LOW (ref 12.0–15.0)
MCH: 27.3 pg (ref 26.0–34.0)
MCHC: 34.3 g/dL (ref 30.0–36.0)
MCV: 79.5 fL — ABNORMAL LOW (ref 80.0–100.0)
Platelets: 343 10*3/uL (ref 150–400)
RBC: 4.14 MIL/uL (ref 3.87–5.11)
RDW: 15.2 % (ref 11.5–15.5)
WBC: 5.4 10*3/uL (ref 4.0–10.5)
nRBC: 0 % (ref 0.0–0.2)

## 2020-11-19 LAB — BASIC METABOLIC PANEL
Anion gap: 11 (ref 5–15)
BUN: 14 mg/dL (ref 8–23)
CO2: 30 mmol/L (ref 22–32)
Calcium: 9.7 mg/dL (ref 8.9–10.3)
Chloride: 97 mmol/L — ABNORMAL LOW (ref 98–111)
Creatinine, Ser: 1.39 mg/dL — ABNORMAL HIGH (ref 0.44–1.00)
GFR, Estimated: 38 mL/min — ABNORMAL LOW (ref >60)
Glucose, Bld: 118 mg/dL — ABNORMAL HIGH (ref 70–99)
Potassium: 3.9 mmol/L (ref 3.5–5.1)
Sodium: 138 mmol/L (ref 135–145)

## 2020-11-19 LAB — TROPONIN I (HIGH SENSITIVITY)
Troponin I (High Sensitivity): 21 ng/L — ABNORMAL HIGH (ref <18)
Troponin I (High Sensitivity): 21 ng/L — ABNORMAL HIGH (ref <18)

## 2020-11-19 LAB — PROTIME-INR
INR: 2.7 — ABNORMAL HIGH (ref 0.8–1.2)
Prothrombin Time: 27.5 seconds — ABNORMAL HIGH (ref 11.4–15.2)

## 2020-11-19 NOTE — ED Triage Notes (Signed)
Patient stated recently dc'd digoxin per OP MD; and hold on coumadin x 2 days due to blood being "too thin". Stated she's feeling some irregular heart rate since Friday.

## 2020-11-20 ENCOUNTER — Other Ambulatory Visit: Payer: Self-pay | Admitting: *Deleted

## 2020-11-20 ENCOUNTER — Ambulatory Visit (INDEPENDENT_AMBULATORY_CARE_PROVIDER_SITE_OTHER): Payer: Medicare HMO

## 2020-11-20 ENCOUNTER — Emergency Department: Payer: Medicare HMO

## 2020-11-20 ENCOUNTER — Other Ambulatory Visit: Payer: Self-pay | Admitting: Physician Assistant

## 2020-11-20 DIAGNOSIS — R001 Bradycardia, unspecified: Secondary | ICD-10-CM | POA: Diagnosis not present

## 2020-11-20 DIAGNOSIS — R002 Palpitations: Secondary | ICD-10-CM | POA: Diagnosis not present

## 2020-11-20 DIAGNOSIS — R55 Syncope and collapse: Secondary | ICD-10-CM

## 2020-11-20 DIAGNOSIS — R7889 Finding of other specified substances, not normally found in blood: Secondary | ICD-10-CM

## 2020-11-20 DIAGNOSIS — I480 Paroxysmal atrial fibrillation: Secondary | ICD-10-CM

## 2020-11-20 DIAGNOSIS — R42 Dizziness and giddiness: Secondary | ICD-10-CM

## 2020-11-20 DIAGNOSIS — Z952 Presence of prosthetic heart valve: Secondary | ICD-10-CM | POA: Diagnosis not present

## 2020-11-20 LAB — SARS CORONAVIRUS 2 (TAT 6-24 HRS): SARS Coronavirus 2: NEGATIVE

## 2020-11-20 LAB — DIGOXIN LEVEL: Digoxin Level: 1.5 ng/mL (ref 0.8–2.0)

## 2020-11-20 MED ORDER — SODIUM CHLORIDE 0.9 % IV BOLUS
500.0000 mL | Freq: Once | INTRAVENOUS | Status: AC
  Administered 2020-11-20: 500 mL via INTRAVENOUS

## 2020-11-20 NOTE — Discharge Instructions (Addendum)
You were seen in the emergency department for evaluation of palpitations nausea vomiting diarrhea generalized weakness.  Your digoxin level was elevated but improved from a few days ago.  Cardiology evaluated you and recommended that you continue to hold her digoxin and your Cardizem until you follow-up with them.  You are to follow-up with cardiology this Thursday.
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
## 2020-11-20 NOTE — Consult Note (Addendum)
Cardiology Consultation:   Patient ID: Beth Miller MRN: [id number]; DOB: [birth year]  Admit date: 11/19/2020 Date of Consult: 11/20/2020  Primary Care Provider: Cassandria Anger, MD [facility] Cardiologist: Jenkins Rouge, MD  [place] Electrophysiologist:  None    Patient Profile:   Beth Miller is a 80 y.o. female with a hx of chronic atrial fibrillation, St Jude mechanical MVR, COPD, carotid artery disease, and DM2 who is being seen today for the evaluation of presyncope and elevated digoxin level at the request of Dr. Melina Copa.  History of Present Illness:   Beth Miller is a 80 year old female with past medical history of chronic atrial fibrillation, St Jude mechanical MVR, COPD, carotid artery disease, and DM2.  She does not have a previous diagnosis of CAD.  Myoview in 2014 was nonischemic.  Echocardiogram obtained in July 2021 showed normal mitral valve function, normal EF.  More recently, patient presented to cardiology office on 11/17/2020 for several months onset of fatigue, forceful heartbeat at night and malaise.  EKG in the office showed possible junctional rhythm with bigeminy concerning for digoxin toxicity.  She was also borderline bradycardic as well.  Both Cardizem and digoxin were discontinued.  Digoxin level came back severely elevated at 3.8, potassium 5.1.  Creatinine was elevated at 1.64.  INR was also elevated at 5.7.  She was instructed to hold Coumadin for 2 days.  Patient was contacted by cardiology APP, according to patient, she has been having symptom of presyncope.  Given her symptomatic nature and current presentation, she was instructed to go to [facility] ED.  On arrival, creatinine has improved to 1.39.  Potassium down to 3.9.  Serial troponin flat at 21-->21.  Digoxin level came down to 1.5.  While in the emergency room, her heart rate ranges between high 40s to 50s range.  EKG shows mostly sinus rhythm with occasional short bursts of bigeminy.   There is minimal diffuse ST depression.  Overall, she says her symptom has been slowly improving for the past 2 days, however still not back to her baseline yet.  Prior to 2 months ago she was quite independent at home however has not been able to do much for the past 2 months due to fatigue and malaise.  She does have a fleeting chest pain that only last about a second each time.  She also endorses at least daily episodes of presyncope and a dizzy spell.  The last episode of presyncope was this morning.   Past Medical History:  Diagnosis Date   AF (atrial fibrillation) ([place])    ALLERGIC RHINITIS    Blood transfusion without reported diagnosis    Cataract, senile    Chest pain    CHF (congestive heart failure) (HCC)    Chronic anticoagulation    Diabetes mellitus    Diabetes mellitus    Disorder of oral soft tissue    of mouth   Diverticulosis    GERD (gastroesophageal reflux disease)    Hematoma    Hypertension    Leg pain    Low back pain    Paresthesia    Rheumatic fever    Status post mitral valve replacement    St. Jude valve   Stroke ([place])    tia   Sweating    Vitamin D deficiency     Past Surgical History:  Procedure Laterality Date   ABDOMINAL HYSTERECTOMY     CARDIAC VALVE REPLACEMENT  1995   Mitral valve prosthesis; st jude  CHOLECYSTECTOMY       Home Medications:  Prior to Admission medications   Medication Sig Start Date End Date Taking? Authorizing Provider  ACCU-CHEK AVIVA PLUS test strip TEST BLOOD SUGAR TWICE DAILY 07/17/20   Plotnikov, Evie Lacks, MD  ACCU-CHEK SOFTCLIX LANCETS lancets 1 each by Other route 2 (two) times daily. Use to check blood sugars twice a day 06/18/17   Plotnikov, Evie Lacks, MD  acetaminophen (TYLENOL) 500 MG tablet Take 1,000 mg by mouth every 6 (six) hours as needed for mild pain.    [provider]  Alcohol Swabs (B-D SINGLE USE SWABS REGULAR) PADS 1 each by Does not apply route 2 (two) times daily. Use to clean finger to  check blood sugars twice a day 06/18/17   Plotnikov, Evie Lacks, MD  amoxicillin (AMOXIL) 500 MG tablet Take four capsules (2,000 mg) one hour prior to all dental visits. 11/25/19   Josue Hector, MD  Blood Glucose Calibration (ACCU-CHEK AVIVA) SOLN 1 each by In Vitro route as directed. 06/18/17   Plotnikov, Evie Lacks, MD  Blood Glucose Monitoring Suppl (ACCU-CHEK AVIVA PLUS) w/Device KIT 1 each by Does not apply route 2 (two) times daily. Use as directed 05/26/19   Plotnikov, Evie Lacks, MD  Cholecalciferol (VITAMIN D3) 1000 UNIT tablet Take 1,000 Units by mouth daily.    [provider]  diltiazem (CARDIZEM CD) 180 MG 24 hr capsule Take 1 capsule (180 mg total) by mouth daily. Take in am 08/03/20   Plotnikov, Evie Lacks, MD  furosemide (LASIX) 40 MG tablet Take 1 tablet (40 mg total) by mouth daily. Take in am 08/03/20   Plotnikov, Evie Lacks, MD  gabapentin (NEURONTIN) 100 MG capsule Take 1 capsule (100 mg total) by mouth at bedtime. 08/11/20   Plotnikov, Evie Lacks, MD  HYDROcodone-acetaminophen (NORCO/VICODIN) 5-325 MG tablet Take 0.5-1 tablets by mouth every 6 (six) hours as needed for severe pain. 10/09/20 10/09/21  Plotnikov, Evie Lacks, MD  Lancet Devices [facility]) lancets 1 each by Other route 2 (two) times daily. Use as instructed 12/04/15   Plotnikov, Evie Lacks, MD  loratadine (CLARITIN) 10 MG tablet Take 1 tablet (10 mg total) by mouth daily as needed for allergies. Take in am 04/26/19   Plotnikov, Evie Lacks, MD  memantine (NAMENDA) 5 MG tablet Take 1 tablet (5 mg total) by mouth 2 (two) times daily. 08/03/20   Plotnikov, Evie Lacks, MD  metFORMIN (GLUCOPHAGE) 500 MG tablet Take 1 tablet (500 mg total) by mouth daily with breakfast. 08/07/20   Marrian Salvage, FNP  methylPREDNISolone (MEDROL DOSEPAK) 4 MG TBPK tablet As directed 10/09/20   Plotnikov, Evie Lacks, MD  mirabegron ER (MYRBETRIQ) 25 MG TB24 tablet Take 1 tablet (25 mg total) by mouth daily. 08/03/20   Plotnikov,  Evie Lacks, MD  ondansetron (ZOFRAN) 4 MG tablet Take 1 tablet (4 mg total) by mouth every 8 (eight) hours as needed for nausea or vomiting. 10/24/16   Nche, Charlene Brooke, NP  OVER THE COUNTER MEDICATION Take 1 tablet by mouth as needed (joint pain).     [provider]  pantoprazole (PROTONIX) 40 MG tablet Take 1 tablet (40 mg total) by mouth daily. Take in am 08/03/20   Plotnikov, Evie Lacks, MD  potassium chloride SA (KLOR-CON) 20 MEQ tablet TAKE 1 TABLET EVERY DAY IN THE MORNING 08/03/20   Plotnikov, Evie Lacks, MD  vitamin B-12 (CYANOCOBALAMIN) 1000 MCG tablet Take 1,000 mcg by mouth daily as needed (energy).  [provider]  warfarin (COUMADIN) 10 MG tablet TAKE 1 TABLET DAILY OR AS DIRECTED BY ANTICOAGULATION CLINIC. 03/20/20   Plotnikov, Evie Lacks, MD  warfarin (COUMADIN) 5 MG tablet TAKE AS DIRECTED 09/16/19   Plotnikov, Evie Lacks, MD    Inpatient Medications: Scheduled Meds:  Continuous Infusions:  PRN Meds:   Allergies:    Allergies  Allergen Reactions   Aricept [Donepezil Hcl]     Intolerance - bad dreams   Food     Bananas and Fish, cause sinus drainage   Furosemide     cramps   Quinine Other (See Comments)    Can't hear   Spironolactone     constipation   Tramadol     headache   Sulfadiazine Itching and Rash    Social History:   Social History   Socioeconomic History   Marital status: Married    Spouse name: Not on file   Number of children: 1   Years of education: Not on file   Highest education level: Not on file  Occupational History   Occupation: Retired    Fish farm manager: RETIRED  Tobacco Use   Smoking status: Former Smoker    Packs/day: 0.02    Years: 60.00    Pack years: 1.20    Types: Cigarettes    Quit date: 10/28/2002    Years since quitting: 18.0   Smokeless tobacco: Never Used   Tobacco comment: quit x 13 years   Substance and Sexual Activity   Alcohol use: No    Alcohol/week: 0.0 standard drinks   Drug use: No   Sexual  activity: Yes  Other Topics Concern   Not on file  Social History Narrative   Negative Family History of Colon Cancer      Regular exercise-yes, bowling      Daily caffeine Use-rare   Social Determinants of Health   Financial Resource Strain: Not on file  Food Insecurity: Not on file  Transportation Needs: Not on file  Physical Activity: Not on file  Stress: Not on file  Social Connections: Not on file  Intimate Partner Violence: Not on file    Family History:    Family History  Problem Relation Age of Onset   Diabetes Mother    Seizures Mother    Other Mother        brain tumor   Heart disease Brother    Diabetes Brother    Heart disease Brother    Pancreatic cancer Brother    Colon cancer Neg Hx      ROS:  Please see the history of present illness.   All other ROS reviewed and negative.     Physical Exam/Data:   Vitals:   11/20/20 1315 11/20/20 1330 11/20/20 1345 11/20/20 1400  BP: (!) 117/97 (!) 122/50 (!) 111/53 (!) 113/51  Pulse: (!) 53 (!) 53 (!) 51 (!) 51  Resp: _0 (!) 23  Temp:      TempSrc:      SpO2: 99% 96% 99% 96%  Weight:      Height:        Intake/Output Summary (Last 24 hours) at 11/20/2020 1420 Last data filed at 11/20/2020 1410 Gross per 24 hour  Intake 500 ml  Output --  Net 500 ml   Last 3 Weights 11/19/2020 11/17/2020 10/23/2020  Weight (lbs) 160 lb 166 lb 170 lb  Weight (kg) 72.576 kg 75.297 kg 77.111 kg     Body mass index is 29.26 kg/m.  General:  Well nourished, well developed, in no acute distress HEENT: normal Lymph: no adenopathy Neck: no JVD Endocrine:  No thryomegaly Vascular: No carotid bruits; FA pulses 2+ bilaterally without bruits  Cardiac:  normal S1, S2; RRR; no murmur  Lungs:  clear to auscultation bilaterally, no wheezing, rhonchi or rales  Abd: soft, nontender, no hepatomegaly  Ext: no edema Musculoskeletal:  No deformities, BUE and BLE strength normal and equal Skin: warm and dry  Neuro:  CNs 2-12  intact, no focal abnormalities noted Psych:  Normal affect   EKG:  The EKG was personally reviewed and demonstrates: Sinus rhythm with minimal ST depression diffusely.  This is unchanged when compared to the previous EKG Telemetry:  Telemetry was personally reviewed and demonstrates: Sinus bradycardia with intermittent bigeminy.  No persistent ventricular ectopy  Relevant CV Studies:  Echo 05/10/2020  1. Left ventricular ejection fraction, by estimation, is 60 to 65%. The  left ventricle has normal function. The left ventricle has no regional  wall motion abnormalities. There is mild left ventricular hypertrophy.  Left ventricular diastolic parameters  are indeterminate.   2. Right ventricular systolic function is normal. The right ventricular  size is normal. There is mildly elevated pulmonary artery systolic  pressure.   3. Left atrial size was severely dilated.   4. Right atrial size was severely dilated.   5. The mitral valve has been repaired/replaced. No evidence of mitral  valve regurgitation. No evidence of mitral stenosis. There is a St. Jude  mechanical valve present in the mitral position. Echo findings are  consistent with normal structure and  function of the mitral valve prosthesis.   6. Tricuspid valve regurgitation is severe.   7. The aortic valve is tricuspid. Aortic valve regurgitation is mild.  Mild aortic valve sclerosis is present, with no evidence of aortic valve  stenosis.   8. Aortic dilatation noted. There is mild dilatation of the ascending  aorta measuring 40 mm.   9. The inferior vena cava is normal in size with greater than 50%  respiratory variability, suggesting right atrial pressure of 3 mmHg.   Comparison(s): No significant change from prior study. Prior images  reviewed side by side.   Laboratory Data:  High Sensitivity Troponin:   Recent Labs  Lab 11/19/20 1757 11/19/20 2018  TROPONINIHS 21* 21*     Chemistry Recent Labs  Lab  11/17/20 1442 11/19/20 1757  NA 142 138  K 5.1 3.9  CL 103 97*  CO2 25 30  GLUCOSE 135* 118*  BUN 18 14  CREATININE 1.64* 1.39*  CALCIUM 10.0 9.7  GFRNONAA 29* 38*  GFRAA 34*  --   ANIONGAP  --  11    No results for input(s): PROT, ALBUMIN, AST, ALT, ALKPHOS, BILITOT in the last 168 hours. Hematology Recent Labs  Lab 11/19/20 1757  WBC 5.4  RBC 4.14  HGB 11.3*  HCT 32.9*  MCV 79.5*  MCH 27.3  MCHC 34.3  RDW 15.2  PLT 343   BNPNo results for input(s): BNP, PROBNP in the last 168 hours.  DDimer No results for input(s): DDIMER in the last 168 hours.   Radiology/Studies:  DG Chest 2 View  Result Date: 11/19/2020 CLINICAL DATA:  Heart palpitations, anticoagulated EXAM: CHEST - 2 VIEW COMPARISON:  08/18/2015 FINDINGS: Frontal and lateral views of the chest demonstrates stable enlargement the cardiac silhouette. Postsurgical changes from median sternotomy. No airspace disease, effusion, or pneumothorax. No acute bony abnormalities. IMPRESSION: 1. Stable enlarged cardiac silhouette.  No acute airspace disease. Electronically Signed   By: Randa Ngo M.D.   On: 11/19/2020 18:30     Assessment and Plan:   Digoxin toxicity: With associated malaise and fatigue.  Digoxin level 3.8 on 11/17/2020 along with potassium 5.1 and INR is 5.7.  INR normalized after 43 doses of Coumadin.  Digoxin level came down to 1.5.  Her symptom likely will continue to improve as the constant I will continue to go down.  Presyncope: Her heart rate is between 48 to 56 bpm while in emergency room.  This degree of bradycardia is unlikely to generate presyncope.  She does have intermittent bigeminy.  I discussed the case with the patient, she does not wish to stay in the hospital.  I discussed the case with MD, will pursue 2-week ZIO AT monitor to see if she is having significant pulses.   Chronic atrial fibrillation: Currently maintaining sinus rhythm.  On Coumadin for mechanical MVR.  Digoxin stopped on  11/17/2020.  She was instructed to stop diltiazem until 1/27.  I would recommend continue to hold diltiazem until follow-up.  St Jude mechanical MVR: Stable on last echocardiogram in 2021  Carotid artery disease  DM2  Atypical chest discomfort: She describes intermittent fleeting chest pain that lasts only a second.  EKG is unchanged.  Symptom is unlikely to be angina, I question if the symptom is likely related to PVCs.   Risk Assessment/Risk Scores:          CHA2DS2-VASc Score = 4  This indicates a 4.8% annual risk of stroke. The patient's score is based upon: CHF History: No HTN History: No Diabetes History: Yes Stroke History: No Vascular Disease History: No Age Score: 2 Gender Score: 1          For questions or updates, please contact [place] Please consult [email or web address] for contact info under    Hilbert Corrigan, [place]  11/20/2020 2:20 PM

## 2020-11-21 DIAGNOSIS — R55 Syncope and collapse: Secondary | ICD-10-CM | POA: Diagnosis not present

## 2020-11-21 DIAGNOSIS — R001 Bradycardia, unspecified: Secondary | ICD-10-CM | POA: Diagnosis not present

## 2020-11-23 ENCOUNTER — Encounter: Payer: Self-pay | Admitting: Cardiovascular Disease

## 2020-11-23 ENCOUNTER — Ambulatory Visit: Payer: Medicare HMO | Admitting: Cardiovascular Disease

## 2020-11-23 ENCOUNTER — Other Ambulatory Visit: Payer: Self-pay

## 2020-11-23 ENCOUNTER — Ambulatory Visit (INDEPENDENT_AMBULATORY_CARE_PROVIDER_SITE_OTHER): Payer: Medicare HMO | Admitting: *Deleted

## 2020-11-23 ENCOUNTER — Telehealth: Payer: Self-pay

## 2020-11-23 VITALS — BP 102/62 | HR 65 | Ht 62.0 in | Wt 166.0 lb

## 2020-11-23 DIAGNOSIS — I4891 Unspecified atrial fibrillation: Secondary | ICD-10-CM | POA: Diagnosis not present

## 2020-11-23 DIAGNOSIS — Z7901 Long term (current) use of anticoagulants: Secondary | ICD-10-CM

## 2020-11-23 DIAGNOSIS — T460X1D Poisoning by cardiac-stimulant glycosides and drugs of similar action, accidental (unintentional), subsequent encounter: Secondary | ICD-10-CM | POA: Diagnosis not present

## 2020-11-23 DIAGNOSIS — J449 Chronic obstructive pulmonary disease, unspecified: Secondary | ICD-10-CM | POA: Diagnosis not present

## 2020-11-23 LAB — POCT INR: INR: 2.3 (ref 2.0–3.0)

## 2020-11-23 MED ORDER — POTASSIUM CHLORIDE CRYS ER 10 MEQ PO TBCR: 10.0000 meq | EXTENDED_RELEASE_TABLET | Freq: Every day | ORAL | 3 refills | Status: DC

## 2020-11-23 MED ORDER — FUROSEMIDE 20 MG PO TABS: 20.0000 mg | ORAL_TABLET | Freq: Every day | ORAL | 3 refills | Status: DC

## 2020-11-23 NOTE — Patient Instructions (Addendum)
Medication Instructions:  Your physician has recommended you make the following change in your medication:  1-DECREASE Furosemide 20 mg by mouth daily 2-DECREASE Potassium 10 meq by mouth daily 3-START back on your Cardizem next week.  *If you need a refill on your cardiac medications before your next appointment, please call your pharmacy*  Lab Work: If you have labs (blood work) drawn today and your tests are completely normal, you will receive your results only by: Marland Kitchen MyChart Message (if you have MyChart) OR . A paper copy in the mail If you have any lab test that is abnormal or we need to change your treatment, we will call you to review the results.  Testing/Procedures: None ordered today.  Follow-Up: At [facility], you and your health needs are our priority.  As part of our continuing mission to provide you with exceptional heart care, we have created designated Provider Care Teams.  These Care Teams include your primary Cardiologist (physician) and Advanced Practice Providers (APPs -  Physician Assistants and Nurse Practitioners) who all work together to provide you with the care you need, when you need it.  We recommend signing up for the patient portal called "MyChart".  Sign up information is provided on this After Visit Summary.  MyChart is used to connect with patients for Virtual Visits (Telemedicine).  Patients are able to view lab/test results, encounter notes, upcoming appointments, etc.  Non-urgent messages can be sent to your provider as well.   To learn more about what you can do with MyChart, go to [email or web address].    Your next appointment:   3 month(s)  The format for your next appointment:   In Person  Provider:   You may see Jenkins Rouge, MD or one of the following Advanced Practice Providers on your designated Care Team:    Kathyrn Drown, NP

## 2020-11-23 NOTE — Telephone Encounter (Signed)
Received monitor alert for 11/22/2020 for Afib.  Pt currently in office to see Dr Johnsie Cancel.  Alert taken to Dr Johnsie Cancel who states pt has known Afib.  Alert left with Dr Johnsie Cancel to be signed.  See Dr Kyla Balzarine OV note for complete details.

## 2020-11-23 NOTE — Patient Instructions (Signed)
Description    Take 15 mg today and then start taking warfarin 15mg  daily, except for 10mg  on Sunday, Tuesday and Thursday. Recheck INR in 1 week.  Coumadin Clinic [phone] Main [phone]

## 2020-11-28 ENCOUNTER — Telehealth: Payer: Self-pay | Admitting: Cardiovascular Disease

## 2020-11-28 NOTE — Telephone Encounter (Signed)
Dr. Peter Minium office is calling on behalf of cardiovascular consultants with [employer]. They are trying to set up a Peer to Peer with Dr. Johnsie Cancel this afternoon in regards to this patients heart monitor. They are unable to access the clinical notes and need to speak with someone clinical to discuss the necessity. Please advise.

## 2020-11-28 NOTE — Telephone Encounter (Signed)
No, only if releasing records to them.  The HIPAA Privacy Rule does not require covered entities to obtain an individual's consent prior to using or disclosing protected health information about him or her for treatment, payment or health care operations.  We do not require an authorization for continuity of care. Hope this helps!

## 2020-11-29 NOTE — Telephone Encounter (Signed)
Will forward to Dr.Nishan. 

## 2020-12-01 ENCOUNTER — Other Ambulatory Visit: Payer: Self-pay

## 2020-12-01 ENCOUNTER — Ambulatory Visit (INDEPENDENT_AMBULATORY_CARE_PROVIDER_SITE_OTHER): Payer: Medicare HMO | Admitting: *Deleted

## 2020-12-01 DIAGNOSIS — Z9889 Other specified postprocedural states: Secondary | ICD-10-CM | POA: Diagnosis not present

## 2020-12-01 DIAGNOSIS — I4891 Unspecified atrial fibrillation: Secondary | ICD-10-CM | POA: Diagnosis not present

## 2020-12-01 DIAGNOSIS — Z7901 Long term (current) use of anticoagulants: Secondary | ICD-10-CM | POA: Diagnosis not present

## 2020-12-01 LAB — POCT INR: INR: 3.3 — AB (ref 2.0–3.0)

## 2020-12-01 NOTE — Patient Instructions (Signed)
Description   Continue taking warfarin 15mg  daily, except for 10mg  on Sunday, Tuesday and Thursday. Recheck INR in 2 weeks. Coumadin Clinic [phone] Main [phone]

## 2020-12-19 ENCOUNTER — Ambulatory Visit (INDEPENDENT_AMBULATORY_CARE_PROVIDER_SITE_OTHER): Payer: Medicare HMO

## 2020-12-19 ENCOUNTER — Other Ambulatory Visit: Payer: Self-pay

## 2020-12-19 DIAGNOSIS — I4891 Unspecified atrial fibrillation: Secondary | ICD-10-CM

## 2020-12-19 DIAGNOSIS — Z9889 Other specified postprocedural states: Secondary | ICD-10-CM

## 2020-12-19 DIAGNOSIS — Z7901 Long term (current) use of anticoagulants: Secondary | ICD-10-CM

## 2020-12-19 LAB — POCT INR: INR: 3.8 — AB (ref 2.0–3.0)

## 2020-12-19 NOTE — Patient Instructions (Signed)
Description   Take 5mg  today, then start taking 10mg  daily, except for 15mg  on Mondays, Wednesdays and Fridays. Recheck INR in 2 weeks. Coumadin Clinic [phone] Main [phone]

## 2021-01-01 ENCOUNTER — Other Ambulatory Visit: Payer: Self-pay

## 2021-01-01 ENCOUNTER — Ambulatory Visit (INDEPENDENT_AMBULATORY_CARE_PROVIDER_SITE_OTHER): Payer: Medicare HMO | Admitting: *Deleted

## 2021-01-01 DIAGNOSIS — Z5181 Encounter for therapeutic drug level monitoring: Secondary | ICD-10-CM | POA: Diagnosis not present

## 2021-01-01 DIAGNOSIS — Z7901 Long term (current) use of anticoagulants: Secondary | ICD-10-CM | POA: Diagnosis not present

## 2021-01-01 DIAGNOSIS — I4891 Unspecified atrial fibrillation: Secondary | ICD-10-CM

## 2021-01-01 LAB — POCT INR: INR: 3.9 — AB (ref 2.0–3.0)

## 2021-01-01 NOTE — Patient Outreach (Signed)
Aging Gracefully Program  01/01/2021  Beth Miller 1941/03/16 [id number]  [facility] Evaluation Interviewer made contact with patient. Aging Gracefully initial evaluation scheduled for 01/02/21.  [place] Management Assistant [phone]

## 2021-01-01 NOTE — Patient Instructions (Signed)
Description    Hold warfarin today and then start taking 10 mg daily except for 15 mg on Mondays and Fridays. Recheck INR in 2 weeks. Coumadin Clinic [phone]

## 2021-01-10 ENCOUNTER — Telehealth: Payer: Self-pay | Admitting: Family

## 2021-01-10 ENCOUNTER — Other Ambulatory Visit: Payer: Self-pay | Admitting: Internal Medicine

## 2021-01-11 ENCOUNTER — Other Ambulatory Visit: Payer: Self-pay | Admitting: Internal Medicine

## 2021-01-11 MED ORDER — METFORMIN HCL 500 MG PO TABS: 500.0000 mg | ORAL_TABLET | Freq: Every day | ORAL | 0 refills | Status: DC

## 2021-01-11 NOTE — Telephone Encounter (Signed)
Patient is requesting a short supply of metFORMIN (GLUCOPHAGE) 500 MG tablet until the mail order pharmacy can get them to her. She is requesting that they be sent to [place], [place]

## 2021-01-11 NOTE — Telephone Encounter (Signed)
Sent in a separate encounter 2 week supply to local.

## 2021-01-11 NOTE — Telephone Encounter (Signed)
Since the original rx was refilled by [facility].. It will not lt me send in a 14 day supply to local pharmacy. Pls send

## 2021-01-15 ENCOUNTER — Ambulatory Visit (INDEPENDENT_AMBULATORY_CARE_PROVIDER_SITE_OTHER): Payer: Medicare HMO

## 2021-01-15 ENCOUNTER — Other Ambulatory Visit: Payer: Self-pay

## 2021-01-15 DIAGNOSIS — Z7901 Long term (current) use of anticoagulants: Secondary | ICD-10-CM

## 2021-01-15 DIAGNOSIS — Z9889 Other specified postprocedural states: Secondary | ICD-10-CM | POA: Diagnosis not present

## 2021-01-15 DIAGNOSIS — I4891 Unspecified atrial fibrillation: Secondary | ICD-10-CM

## 2021-01-15 LAB — POCT INR: INR: 2.1 (ref 2.0–3.0)

## 2021-01-15 NOTE — Patient Instructions (Signed)
-   take 20 mg warfarin tonight, then  - resume taking 10 mg daily except for 15 mg on Mondays and Fridays.  - Recheck INR in 3 weeks.  Coumadin Clinic [phone]

## 2021-01-22 ENCOUNTER — Other Ambulatory Visit: Payer: Self-pay | Admitting: Internal Medicine

## 2021-01-22 ENCOUNTER — Ambulatory Visit: Payer: Medicare HMO | Admitting: Internal Medicine

## 2021-01-22 MED ORDER — METFORMIN HCL 500 MG PO TABS: 500.0000 mg | ORAL_TABLET | Freq: Every day | ORAL | 3 refills | Status: DC

## 2021-01-22 NOTE — Telephone Encounter (Signed)
metFORMIN (GLUCOPHAGE) 500 MG tablet [facility] calling saying they never got the request for the mail order

## 2021-01-22 NOTE — Telephone Encounter (Signed)
Resent rx to [place].Marland KitchenJohny Miller

## 2021-01-22 NOTE — Telephone Encounter (Signed)
Patient needs a refill

## 2021-01-22 NOTE — Addendum Note (Signed)
Addended by: Earnstine Regal on: 01/22/2021 11:37 AM   Modules accepted: Orders

## 2021-01-24 ENCOUNTER — Other Ambulatory Visit: Payer: Self-pay | Admitting: Internal Medicine

## 2021-01-24 ENCOUNTER — Telehealth: Payer: Self-pay | Admitting: Internal Medicine

## 2021-01-24 NOTE — Telephone Encounter (Signed)
1.Medication Requested: metFORMIN (GLUCOPHAGE) 500 MG tablet    2. Pharmacy (Name, Street, [place]): [facility] [phone] - [place], [address] AT [address]  3. On Med List: yes   4. Last Visit with PCP: 10/09/20  5. Next visit date with PCP: 01/30/21  Patient is out of medication. A 90 supply was sent to [facility]. She is requesting a supply until she can get it from [facility]. Please advise    Agent: Please be advised that RX refills may take up to 3 business days. We ask that you follow-up with your pharmacy.

## 2021-01-24 NOTE — Telephone Encounter (Signed)
Medication already been sent.Marland KitchenJohny Miller

## 2021-01-29 ENCOUNTER — Other Ambulatory Visit: Payer: Self-pay

## 2021-01-30 ENCOUNTER — Encounter: Payer: Self-pay | Admitting: Internal Medicine

## 2021-01-30 ENCOUNTER — Ambulatory Visit (INDEPENDENT_AMBULATORY_CARE_PROVIDER_SITE_OTHER): Payer: Medicare Other | Admitting: Internal Medicine

## 2021-01-30 VITALS — BP 130/84 | HR 100 | Temp 98.3°F | Ht 62.0 in | Wt 179.0 lb

## 2021-01-30 DIAGNOSIS — G8929 Other chronic pain: Secondary | ICD-10-CM | POA: Diagnosis not present

## 2021-01-30 DIAGNOSIS — R739 Hyperglycemia, unspecified: Secondary | ICD-10-CM | POA: Diagnosis not present

## 2021-01-30 DIAGNOSIS — E119 Type 2 diabetes mellitus without complications: Secondary | ICD-10-CM

## 2021-01-30 DIAGNOSIS — I4821 Permanent atrial fibrillation: Secondary | ICD-10-CM

## 2021-01-30 DIAGNOSIS — I10 Essential (primary) hypertension: Secondary | ICD-10-CM

## 2021-01-30 DIAGNOSIS — E559 Vitamin D deficiency, unspecified: Secondary | ICD-10-CM | POA: Diagnosis not present

## 2021-01-30 DIAGNOSIS — Z7901 Long term (current) use of anticoagulants: Secondary | ICD-10-CM | POA: Diagnosis not present

## 2021-01-30 DIAGNOSIS — I700 Atherosclerosis of aorta: Secondary | ICD-10-CM | POA: Diagnosis not present

## 2021-01-30 DIAGNOSIS — M545 Low back pain, unspecified: Secondary | ICD-10-CM | POA: Diagnosis not present

## 2021-01-30 DIAGNOSIS — R635 Abnormal weight gain: Secondary | ICD-10-CM | POA: Diagnosis not present

## 2021-01-30 LAB — COMPREHENSIVE METABOLIC PANEL
ALT: 12 U/L (ref 0–35)
AST: 20 U/L (ref 0–37)
Albumin: 4 g/dL (ref 3.5–5.2)
Alkaline Phosphatase: 73 U/L (ref 39–117)
BUN: 14 mg/dL (ref 6–23)
CO2: 33 mEq/L — ABNORMAL HIGH (ref 19–32)
Calcium: 9.7 mg/dL (ref 8.4–10.5)
Chloride: 103 mEq/L (ref 96–112)
Creatinine, Ser: 0.92 mg/dL (ref 0.40–1.20)
GFR: 58.41 mL/min — ABNORMAL LOW (ref >60.00)
Glucose, Bld: 132 mg/dL — ABNORMAL HIGH (ref 70–99)
Potassium: 4.3 mEq/L (ref 3.5–5.1)
Sodium: 141 mEq/L (ref 135–145)
Total Bilirubin: 0.4 mg/dL (ref 0.2–1.2)
Total Protein: 6.7 g/dL (ref 6.0–8.3)

## 2021-01-30 LAB — CBC WITH DIFFERENTIAL/PLATELET
Basophils Absolute: 0.1 10*3/uL (ref 0.0–0.1)
Basophils Relative: 1 % (ref 0.0–3.0)
Eosinophils Absolute: 0.1 10*3/uL (ref 0.0–0.7)
Eosinophils Relative: 1.9 % (ref 0.0–5.0)
HCT: 33 % — ABNORMAL LOW (ref 36.0–46.0)
Hemoglobin: 11 g/dL — ABNORMAL LOW (ref 12.0–15.0)
Lymphocytes Relative: 26 % (ref 12.0–46.0)
Lymphs Abs: 1.4 10*3/uL (ref 0.7–4.0)
MCHC: 33.4 g/dL (ref 30.0–36.0)
MCV: 80.4 fl (ref 78.0–100.0)
Monocytes Absolute: 0.7 10*3/uL (ref 0.1–1.0)
Monocytes Relative: 13.4 % — ABNORMAL HIGH (ref 3.0–12.0)
Neutro Abs: 3.1 10*3/uL (ref 1.4–7.7)
Neutrophils Relative %: 57.7 % (ref 43.0–77.0)
Platelets: 239 10*3/uL (ref 150.0–400.0)
RBC: 4.11 Mil/uL (ref 3.87–5.11)
RDW: 17.4 % — ABNORMAL HIGH (ref 11.5–15.5)
WBC: 5.3 10*3/uL (ref 4.0–10.5)

## 2021-01-30 LAB — HEMOGLOBIN A1C: Hgb A1c MFr Bld: 6.8 % — ABNORMAL HIGH (ref 4.6–6.5)

## 2021-01-30 LAB — TSH: TSH: 3.56 u[IU]/mL (ref 0.35–4.50)

## 2021-01-30 MED ORDER — WARFARIN SODIUM 5 MG PO TABS: ORAL_TABLET | ORAL | 1 refills | Status: DC

## 2021-01-30 MED ORDER — WARFARIN SODIUM 10 MG PO TABS: ORAL_TABLET | ORAL | 1 refills | Status: DC

## 2021-01-30 MED ORDER — GABAPENTIN 100 MG PO CAPS: 100.0000 mg | ORAL_CAPSULE | Freq: Every day | ORAL | 3 refills | Status: DC

## 2021-01-30 MED ORDER — POTASSIUM CHLORIDE CRYS ER 10 MEQ PO TBCR: 10.0000 meq | EXTENDED_RELEASE_TABLET | Freq: Every day | ORAL | 3 refills | Status: DC

## 2021-01-30 MED ORDER — METFORMIN HCL 500 MG PO TABS: 500.0000 mg | ORAL_TABLET | Freq: Every day | ORAL | 3 refills | Status: DC

## 2021-01-30 MED ORDER — MIRABEGRON ER 25 MG PO TB24: 25.0000 mg | ORAL_TABLET | Freq: Every day | ORAL | 3 refills | Status: DC

## 2021-01-30 MED ORDER — DILTIAZEM HCL ER COATED BEADS 180 MG PO CP24: 180.0000 mg | ORAL_CAPSULE | Freq: Every day | ORAL | 3 refills | Status: DC

## 2021-01-30 MED ORDER — PANTOPRAZOLE SODIUM 40 MG PO TBEC: 40.0000 mg | DELAYED_RELEASE_TABLET | Freq: Every day | ORAL | 3 refills | Status: DC

## 2021-01-30 MED ORDER — FUROSEMIDE 20 MG PO TABS: 20.0000 mg | ORAL_TABLET | Freq: Every day | ORAL | 3 refills | Status: DC

## 2021-01-30 MED ORDER — MEMANTINE HCL 5 MG PO TABS: 5.0000 mg | ORAL_TABLET | Freq: Two times a day (BID) | ORAL | 3 refills | Status: DC

## 2021-01-30 NOTE — Assessment & Plan Note (Signed)
Goal INR around 3.0 On Coumadin Risks associated with testing INR monthly noncompliance were discussed. Compliance was encouraged.

## 2021-01-30 NOTE — Assessment & Plan Note (Signed)
Check A1c. 

## 2021-01-30 NOTE — Assessment & Plan Note (Signed)
On Vit D 

## 2021-01-30 NOTE — Assessment & Plan Note (Signed)
Cartia, Digoxin, Coumadin

## 2021-01-30 NOTE — Assessment & Plan Note (Signed)
Check TSH Diet disussed

## 2021-01-30 NOTE — Assessment & Plan Note (Signed)
Better  

## 2021-01-30 NOTE — Assessment & Plan Note (Signed)
On Furosemide, Cartia

## 2021-01-30 NOTE — Progress Notes (Signed)
Subjective:  Patient ID: Beth Miller, female    DOB: 1941-09-29  Age: 80 y.o. MRN: [id number]  CC: Follow-up (3 month f/u)   HPI BRIN RUGGERIO presents for A fib, DM, HTN C/o wt gain  Outpatient Medications Prior to Visit  Medication Sig Dispense Refill  . ACCU-CHEK AVIVA PLUS test strip TEST BLOOD SUGAR TWICE DAILY 200 strip 3  . ACCU-CHEK SOFTCLIX LANCETS lancets 1 each by Other route 2 (two) times daily. Use to check blood sugars twice a day 200 each 3  . acetaminophen (TYLENOL) 500 MG tablet Take 1,000 mg by mouth every 6 (six) hours as needed for mild pain.    . Alcohol Swabs (B-D SINGLE USE SWABS REGULAR) PADS 1 each by Does not apply route 2 (two) times daily. Use to clean finger to check blood sugars twice a day 200 each 3  . amoxicillin (AMOXIL) 500 MG tablet Take four capsules (2,000 mg) one hour prior to all dental visits. 8 tablet 11  . Blood Glucose Calibration (ACCU-CHEK AVIVA) SOLN 1 each by In Vitro route as directed. 3 each 1  . Blood Glucose Monitoring Suppl (ACCU-CHEK AVIVA PLUS) w/Device KIT 1 each by Does not apply route 2 (two) times daily. Use as directed 1 kit 0  . Cholecalciferol (VITAMIN D3) 1000 UNIT tablet Take 1,000 Units by mouth daily.    Marland Kitchen diltiazem (CARDIZEM CD) 180 MG 24 hr capsule Take 1 capsule (180 mg total) by mouth daily. Take in am 90 capsule 3  . furosemide (LASIX) 20 MG tablet Take 1 tablet (20 mg total) by mouth daily. Take in am 90 tablet 3  . gabapentin (NEURONTIN) 100 MG capsule Take 1 capsule (100 mg total) by mouth at bedtime. 90 capsule 3  . HYDROcodone-acetaminophen (NORCO/VICODIN) 5-325 MG tablet Take 0.5-1 tablets by mouth every 6 (six) hours as needed for severe pain. 20 tablet 0  . Lancet Devices (ACCU-CHEK SOFTCLIX) lancets 1 each by Other route 2 (two) times daily. Use as instructed 1 each 5  . loratadine (CLARITIN) 10 MG tablet Take 1 tablet (10 mg total) by mouth daily as needed for allergies. Take in am 100 tablet 3  .  memantine (NAMENDA) 5 MG tablet Take 1 tablet (5 mg total) by mouth 2 (two) times daily. 180 tablet 3  . metFORMIN (GLUCOPHAGE) 500 MG tablet Take 1 tablet (500 mg total) by mouth daily with breakfast. 90 tablet 3  . methylPREDNISolone (MEDROL DOSEPAK) 4 MG TBPK tablet As directed 21 tablet 0  . mirabegron ER (MYRBETRIQ) 25 MG TB24 tablet Take 1 tablet (25 mg total) by mouth daily. 90 tablet 3  . ondansetron (ZOFRAN) 4 MG tablet Take 1 tablet (4 mg total) by mouth every 8 (eight) hours as needed for nausea or vomiting. 20 tablet 0  . OVER THE COUNTER MEDICATION Take 1 tablet by mouth as needed (joint pain).     . pantoprazole (PROTONIX) 40 MG tablet Take 1 tablet (40 mg total) by mouth daily. Take in am 90 tablet 3  . potassium chloride SA (KLOR-CON) 10 MEQ tablet Take 1 tablet (10 mEq total) by mouth daily. 90 tablet 3  . vitamin B-12 (CYANOCOBALAMIN) 1000 MCG tablet Take 1,000 mcg by mouth daily as needed (energy).    . warfarin (COUMADIN) 10 MG tablet TAKE 1 TABLET DAILY OR AS DIRECTED BY ANTICOAGULATION CLINIC. 105 tablet 1  . warfarin (COUMADIN) 5 MG tablet Take 1 tablet twice weekly along with 32m dose or as  advised by Coumadin clinic. 30 tablet 0  . COVID-19 mRNA vaccine, Pfizer, 30 MCG/0.3ML injection USE AS DIRECTED (Patient not taking: Reported on 01/30/2021) .3 mL 0  . metFORMIN (GLUCOPHAGE) 500 MG tablet Take 1 tablet (500 mg total) by mouth daily with breakfast. 14 tablet 0  . metFORMIN (GLUCOPHAGE) 500 MG tablet TAKE 1 TABLET(500 MG) BY MOUTH DAILY WITH BREAKFAST 14 tablet 0   No facility-administered medications prior to visit.    ROS: Review of Systems  Constitutional: Positive for fatigue. Negative for activity change, appetite change, chills and unexpected weight change.  HENT: Negative for congestion, mouth sores and sinus pressure.   Eyes: Negative for visual disturbance.  Respiratory: Negative for cough and chest tightness.   Gastrointestinal: Negative for abdominal pain  and nausea.  Genitourinary: Negative for difficulty urinating, frequency and vaginal pain.  Musculoskeletal: Negative for back pain and gait problem.  Skin: Negative for pallor and rash.  Neurological: Negative for dizziness, tremors, weakness, numbness and headaches.  Psychiatric/Behavioral: Negative for confusion and sleep disturbance.    Objective:  BP 130/84 (BP Location: Left Arm)   Pulse 100   Temp 98.3 F (36.8 C) (Oral)   Ht _0  (1.575 m)   Wt 179 lb (81.2 kg)   LMP  (LMP Unknown)   SpO2 96%   BMI 32.74 kg/m   BP Readings from Last 3 Encounters:  01/30/21 130/84  11/23/20 102/62  11/20/20 (!) 110/49    Wt Readings from Last 3 Encounters:  01/30/21 179 lb (81.2 kg)  11/23/20 166 lb (75.3 kg)  11/19/20 160 lb (72.6 kg)    Physical Exam Constitutional:      General: She is not in acute distress.    Appearance: She is well-developed.  HENT:     Head: Normocephalic.     Right Ear: External ear normal.     Left Ear: External ear normal.     Nose: Nose normal.  Eyes:     General:        Right eye: No discharge.        Left eye: No discharge.     Conjunctiva/sclera: Conjunctivae normal.     Pupils: Pupils are equal, round, and reactive to light.  Neck:     Thyroid: No thyromegaly.     Vascular: No JVD.     Trachea: No tracheal deviation.  Cardiovascular:     Rate and Rhythm: Normal rate and regular rhythm.     Heart sounds: Normal heart sounds.  Pulmonary:     Effort: No respiratory distress.     Breath sounds: No stridor. No wheezing.  Abdominal:     General: Bowel sounds are normal. There is no distension.     Palpations: Abdomen is soft. There is no mass.     Tenderness: There is no abdominal tenderness. There is no guarding or rebound.  Musculoskeletal:        General: No tenderness.     Cervical back: Normal range of motion and neck supple.  Lymphadenopathy:     Cervical: No cervical adenopathy.  Skin:    Findings: No erythema or rash.   Neurological:     Cranial Nerves: No cranial nerve deficit.     Motor: No abnormal muscle tone.     Coordination: Coordination normal.     Deep Tendon Reflexes: Reflexes normal.  Psychiatric:        Behavior: Behavior normal.        Thought Content: Thought content normal.  Judgment: Judgment normal.    Crisp click Lab Results  Component Value Date   WBC 5.4 11/19/2020   HGB 11.3 (L) 11/19/2020   HCT 32.9 (L) 11/19/2020   PLT 343 11/19/2020   GLUCOSE 118 (H) 11/19/2020   CHOL 106 04/27/2019   TRIG 177.0 (H) 04/27/2019   HDL 37.40 (L) 04/27/2019   LDLCALC 33 04/27/2019   ALT 9 08/03/2020   AST 16 08/03/2020   NA 138 11/19/2020   K 3.9 11/19/2020   CL 97 (L) 11/19/2020   CREATININE 1.39 (H) 11/19/2020   BUN 14 11/19/2020   CO2 30 11/19/2020   TSH 4.32 09/16/2019   INR 2.1 01/15/2021   HGBA1C 8.4 (H) 08/03/2020    DG Chest 2 View  Result Date: 11/19/2020 CLINICAL DATA:  Heart palpitations, anticoagulated EXAM: CHEST - 2 VIEW COMPARISON:  08/18/2015 FINDINGS: Frontal and lateral views of the chest demonstrates stable enlargement the cardiac silhouette. Postsurgical changes from median sternotomy. No airspace disease, effusion, or pneumothorax. No acute bony abnormalities. IMPRESSION: 1. Stable enlarged cardiac silhouette.  No acute airspace disease. Electronically Signed   By: Randa Ngo M.D.   On: 11/19/2020 18:30    Assessment & Plan:    Walker Kehr, MD

## 2021-02-01 NOTE — Progress Notes (Deleted)
Patient ID: Beth Miller, female   DOB: 1941/06/29, 80 y.o.   MRN: [id number]       79 y.o. f/u for MVR, chronic afib. She has history of COPD, DM, PVD no history of CAD Seen in office 11/17/20 with ECG showing likely digoxin toxic rhythm   ECG 11/17/20 shows ? Junctional rhythm with bigeminny  Dig level came back 3.8 and medicine Stopped   Feels 100% better 12/12/20  Monitor shows afib rates 70's and no malignant ventricular beats or junctional rhythm  Echo 05/10/20 reviewed EF 60-65% severe bi atrial enlargement St Jude MVR normal no residual MR AV sclerosis with mean gradient 7 peak 14 mmHg  ***   ROS: Denies fever, malais, weight loss, blurry vision, decreased visual acuity, cough, sputum, SOB, hemoptysis, pleuritic pain, palpitaitons, heartburn, abdominal pain, melena, lower extremity edema, claudication, or rash.  All other systems reviewed and negative  General: There were no vitals filed for this visit.   Affect appropriate Healthy:  63 female  HEENT: normal Neck supple with no adenopathy JVP elevated no bruits no thyromegaly Lungs clear with no wheezing and good diaphragmatic motion Heart:  S1 click no MR murmur post sternotomy  Abdomen: benighn, BS positve, no tenderness, no AAA no bruit.  No HSM or HJR Distal pulses intact with no bruits No edema Neuro non-focal Skin warm and dry No muscular weakness   Current Outpatient Medications  Medication Sig Dispense Refill  . ACCU-CHEK AVIVA PLUS test strip TEST BLOOD SUGAR TWICE DAILY 200 strip 3  . ACCU-CHEK SOFTCLIX LANCETS lancets 1 each by Other route 2 (two) times daily. Use to check blood sugars twice a day 200 each 3  . acetaminophen (TYLENOL) 500 MG tablet Take 1,000 mg by mouth every 6 (six) hours as needed for mild pain.    . Alcohol Swabs (B-D SINGLE USE SWABS REGULAR) PADS 1 each by Does not apply route 2 (two) times daily. Use to clean finger to check blood sugars twice a day 200 each 3  . amoxicillin  (AMOXIL) 500 MG tablet Take four capsules (2,000 mg) one hour prior to all dental visits. 8 tablet 11  . Blood Glucose Calibration (ACCU-CHEK AVIVA) SOLN 1 each by In Vitro route as directed. 3 each 1  . Blood Glucose Monitoring Suppl (ACCU-CHEK AVIVA PLUS) w/Device KIT 1 each by Does not apply route 2 (two) times daily. Use as directed 1 kit 0  . Cholecalciferol (VITAMIN D3) 1000 UNIT tablet Take 1,000 Units by mouth daily.    Marland Kitchen diltiazem (CARDIZEM CD) 180 MG 24 hr capsule Take 1 capsule (180 mg total) by mouth daily. Take in am 90 capsule 3  . furosemide (LASIX) 20 MG tablet Take 1 tablet (20 mg total) by mouth daily. Take in am 90 tablet 3  . gabapentin (NEURONTIN) 100 MG capsule Take 1 capsule (100 mg total) by mouth at bedtime. 90 capsule 3  . HYDROcodone-acetaminophen (NORCO/VICODIN) 5-325 MG tablet Take 0.5-1 tablets by mouth every 6 (six) hours as needed for severe pain. 20 tablet 0  . Lancet Devices (ACCU-CHEK SOFTCLIX) lancets 1 each by Other route 2 (two) times daily. Use as instructed 1 each 5  . loratadine (CLARITIN) 10 MG tablet Take 1 tablet (10 mg total) by mouth daily as needed for allergies. Take in am 100 tablet 3  . memantine (NAMENDA) 5 MG tablet Take 1 tablet (5 mg total) by mouth 2 (two) times daily. 180 tablet 3  . metFORMIN (GLUCOPHAGE) 500 MG  tablet Take 1 tablet (500 mg total) by mouth daily with breakfast. 90 tablet 3  . methylPREDNISolone (MEDROL DOSEPAK) 4 MG TBPK tablet As directed 21 tablet 0  . mirabegron ER (MYRBETRIQ) 25 MG TB24 tablet Take 1 tablet (25 mg total) by mouth daily. 90 tablet 3  . ondansetron (ZOFRAN) 4 MG tablet Take 1 tablet (4 mg total) by mouth every 8 (eight) hours as needed for nausea or vomiting. 20 tablet 0  . OVER THE COUNTER MEDICATION Take 1 tablet by mouth as needed (joint pain).     . pantoprazole (PROTONIX) 40 MG tablet Take 1 tablet (40 mg total) by mouth daily. Take in am 90 tablet 3  . potassium chloride (KLOR-CON) 10 MEQ tablet Take 1  tablet (10 mEq total) by mouth daily. 90 tablet 3  . vitamin B-12 (CYANOCOBALAMIN) 1000 MCG tablet Take 1,000 mcg by mouth daily as needed (energy).    . warfarin (COUMADIN) 10 MG tablet As directed 105 tablet 1  . warfarin (COUMADIN) 5 MG tablet Take 1 tablet twice weekly along with 66m dose or as advised by Coumadin clinic. 90 tablet 1   No current facility-administered medications for this visit.    Allergies  Aricept [donepezil hcl], Food, Furosemide, Quinine, Spironolactone, Tramadol, and Sulfadiazine  Electrocardiogram:   02/01/2021 Dig toxic rhythm gone afib rate 106 nonspecific ST Changes no ventricular arrhythmias   Assessment and Plan Afib:  Dig toxicity 11/17/20  Junctional with paired PVCls Level 3.8 med d/c back on cardizem for rate control continue coumadin   HTN:  Well controlled.  Continue current medications and low sodium Dash type diet. Decrease lasix/K as azotemic When found to have dig toxicity  Carotid: Plaque on duplex  05/24/20 no stenosis observe   MVR:  Normal valve click  Echo reviewed 70/60/15normal function EF normal continue coumadin   DM:  Discussed low carb diet.  Target hemoglobin A1c is 6.5 or less.  Continue current medications Most recent A1c 6.8 01/30/21 .  Anticoagulation: long discussion about compliance. No TIA/bleeding issues INR  2.1 01/15/21   F/u with me  In 6 months    PJenkins Rouge

## 2021-02-05 ENCOUNTER — Other Ambulatory Visit: Payer: Self-pay

## 2021-02-05 ENCOUNTER — Emergency Department (HOSPITAL_COMMUNITY): Payer: Medicare Other

## 2021-02-05 ENCOUNTER — Encounter (HOSPITAL_COMMUNITY): Payer: Self-pay | Admitting: Family Medicine

## 2021-02-05 ENCOUNTER — Inpatient Hospital Stay (HOSPITAL_COMMUNITY)
Admission: EM | Admit: 2021-02-05 | Discharge: 2021-02-12 | DRG: 872 | Disposition: A | Discharge status: Home Health | Payer: Medicare Other | Attending: Internal Medicine | Admitting: Internal Medicine

## 2021-02-05 DIAGNOSIS — Z833 Family history of diabetes mellitus: Secondary | ICD-10-CM

## 2021-02-05 DIAGNOSIS — E861 Hypovolemia: Secondary | ICD-10-CM | POA: Diagnosis present

## 2021-02-05 DIAGNOSIS — G4733 Obstructive sleep apnea (adult) (pediatric): Secondary | ICD-10-CM | POA: Diagnosis not present

## 2021-02-05 DIAGNOSIS — K219 Gastro-esophageal reflux disease without esophagitis: Secondary | ICD-10-CM | POA: Diagnosis not present

## 2021-02-05 DIAGNOSIS — R509 Fever, unspecified: Secondary | ICD-10-CM | POA: Diagnosis not present

## 2021-02-05 DIAGNOSIS — Z6832 Body mass index (BMI) 32.0-32.9, adult: Secondary | ICD-10-CM

## 2021-02-05 DIAGNOSIS — I130 Hypertensive heart and chronic kidney disease with heart failure and stage 1 through stage 4 chronic kidney disease, or unspecified chronic kidney disease: Secondary | ICD-10-CM | POA: Diagnosis present

## 2021-02-05 DIAGNOSIS — Z800 Family history of malignant neoplasm of digestive organs: Secondary | ICD-10-CM | POA: Diagnosis not present

## 2021-02-05 DIAGNOSIS — I517 Cardiomegaly: Secondary | ICD-10-CM | POA: Diagnosis not present

## 2021-02-05 DIAGNOSIS — N1831 Chronic kidney disease, stage 3a: Secondary | ICD-10-CM | POA: Diagnosis not present

## 2021-02-05 DIAGNOSIS — R32 Unspecified urinary incontinence: Secondary | ICD-10-CM | POA: Diagnosis not present

## 2021-02-05 DIAGNOSIS — I2510 Atherosclerotic heart disease of native coronary artery without angina pectoris: Secondary | ICD-10-CM | POA: Diagnosis not present

## 2021-02-05 DIAGNOSIS — R911 Solitary pulmonary nodule: Secondary | ICD-10-CM | POA: Diagnosis not present

## 2021-02-05 DIAGNOSIS — A411 Sepsis due to other specified staphylococcus: Principal | ICD-10-CM | POA: Diagnosis present

## 2021-02-05 DIAGNOSIS — M25452 Effusion, left hip: Secondary | ICD-10-CM | POA: Diagnosis not present

## 2021-02-05 DIAGNOSIS — R791 Abnormal coagulation profile: Secondary | ICD-10-CM | POA: Diagnosis present

## 2021-02-05 DIAGNOSIS — Z952 Presence of prosthetic heart valve: Secondary | ICD-10-CM | POA: Diagnosis not present

## 2021-02-05 DIAGNOSIS — J9811 Atelectasis: Secondary | ICD-10-CM | POA: Diagnosis not present

## 2021-02-05 DIAGNOSIS — R197 Diarrhea, unspecified: Secondary | ICD-10-CM | POA: Diagnosis present

## 2021-02-05 DIAGNOSIS — Z79899 Other long term (current) drug therapy: Secondary | ICD-10-CM

## 2021-02-05 DIAGNOSIS — Z888 Allergy status to other drugs, medicaments and biological substances status: Secondary | ICD-10-CM

## 2021-02-05 DIAGNOSIS — R109 Unspecified abdominal pain: Secondary | ICD-10-CM | POA: Diagnosis not present

## 2021-02-05 DIAGNOSIS — R000 Tachycardia, unspecified: Secondary | ICD-10-CM | POA: Diagnosis not present

## 2021-02-05 DIAGNOSIS — I480 Paroxysmal atrial fibrillation: Secondary | ICD-10-CM | POA: Diagnosis not present

## 2021-02-05 DIAGNOSIS — I5032 Chronic diastolic (congestive) heart failure: Secondary | ICD-10-CM | POA: Diagnosis not present

## 2021-02-05 DIAGNOSIS — M7918 Myalgia, other site: Secondary | ICD-10-CM

## 2021-02-05 DIAGNOSIS — Z8249 Family history of ischemic heart disease and other diseases of the circulatory system: Secondary | ICD-10-CM | POA: Diagnosis not present

## 2021-02-05 DIAGNOSIS — Z7901 Long term (current) use of anticoagulants: Secondary | ICD-10-CM

## 2021-02-05 DIAGNOSIS — R7881 Bacteremia: Secondary | ICD-10-CM | POA: Diagnosis not present

## 2021-02-05 DIAGNOSIS — Z7984 Long term (current) use of oral hypoglycemic drugs: Secondary | ICD-10-CM | POA: Diagnosis not present

## 2021-02-05 DIAGNOSIS — N281 Cyst of kidney, acquired: Secondary | ICD-10-CM | POA: Diagnosis not present

## 2021-02-05 DIAGNOSIS — Z87891 Personal history of nicotine dependence: Secondary | ICD-10-CM | POA: Diagnosis not present

## 2021-02-05 DIAGNOSIS — E1122 Type 2 diabetes mellitus with diabetic chronic kidney disease: Secondary | ICD-10-CM | POA: Diagnosis not present

## 2021-02-05 DIAGNOSIS — Z882 Allergy status to sulfonamides status: Secondary | ICD-10-CM

## 2021-02-05 DIAGNOSIS — J929 Pleural plaque without asbestos: Secondary | ICD-10-CM | POA: Diagnosis not present

## 2021-02-05 DIAGNOSIS — E669 Obesity, unspecified: Secondary | ICD-10-CM | POA: Diagnosis present

## 2021-02-05 DIAGNOSIS — E785 Hyperlipidemia, unspecified: Secondary | ICD-10-CM | POA: Diagnosis not present

## 2021-02-05 DIAGNOSIS — M25552 Pain in left hip: Secondary | ICD-10-CM | POA: Diagnosis not present

## 2021-02-05 DIAGNOSIS — Z9889 Other specified postprocedural states: Secondary | ICD-10-CM | POA: Diagnosis not present

## 2021-02-05 DIAGNOSIS — M791 Myalgia, unspecified site: Secondary | ICD-10-CM | POA: Diagnosis not present

## 2021-02-05 DIAGNOSIS — K439 Ventral hernia without obstruction or gangrene: Secondary | ICD-10-CM | POA: Diagnosis not present

## 2021-02-05 DIAGNOSIS — R6510 Systemic inflammatory response syndrome (SIRS) of non-infectious origin without acute organ dysfunction: Secondary | ICD-10-CM | POA: Diagnosis not present

## 2021-02-05 DIAGNOSIS — R079 Chest pain, unspecified: Secondary | ICD-10-CM | POA: Diagnosis not present

## 2021-02-05 DIAGNOSIS — E119 Type 2 diabetes mellitus without complications: Secondary | ICD-10-CM

## 2021-02-05 DIAGNOSIS — K469 Unspecified abdominal hernia without obstruction or gangrene: Secondary | ICD-10-CM | POA: Diagnosis not present

## 2021-02-05 DIAGNOSIS — M549 Dorsalgia, unspecified: Secondary | ICD-10-CM | POA: Diagnosis not present

## 2021-02-05 DIAGNOSIS — R319 Hematuria, unspecified: Secondary | ICD-10-CM | POA: Diagnosis present

## 2021-02-05 DIAGNOSIS — I4891 Unspecified atrial fibrillation: Secondary | ICD-10-CM | POA: Diagnosis present

## 2021-02-05 DIAGNOSIS — Z20822 Contact with and (suspected) exposure to covid-19: Secondary | ICD-10-CM | POA: Diagnosis not present

## 2021-02-05 DIAGNOSIS — B957 Other staphylococcus as the cause of diseases classified elsewhere: Secondary | ICD-10-CM | POA: Diagnosis not present

## 2021-02-05 DIAGNOSIS — E1142 Type 2 diabetes mellitus with diabetic polyneuropathy: Secondary | ICD-10-CM | POA: Diagnosis not present

## 2021-02-05 DIAGNOSIS — A419 Sepsis, unspecified organism: Secondary | ICD-10-CM | POA: Diagnosis not present

## 2021-02-05 DIAGNOSIS — Z885 Allergy status to narcotic agent status: Secondary | ICD-10-CM

## 2021-02-05 DIAGNOSIS — Z8673 Personal history of transient ischemic attack (TIA), and cerebral infarction without residual deficits: Secondary | ICD-10-CM

## 2021-02-05 DIAGNOSIS — E118 Type 2 diabetes mellitus with unspecified complications: Secondary | ICD-10-CM

## 2021-02-05 DIAGNOSIS — Z954 Presence of other heart-valve replacement: Secondary | ICD-10-CM | POA: Diagnosis not present

## 2021-02-05 DIAGNOSIS — Z91018 Allergy to other foods: Secondary | ICD-10-CM

## 2021-02-05 LAB — I-STAT CHEM 8, ED
BUN: 17 mg/dL (ref 8–23)
Calcium, Ion: 1.03 mmol/L — ABNORMAL LOW (ref 1.15–1.40)
Chloride: 102 mmol/L (ref 98–111)
Creatinine, Ser: 1.1 mg/dL — ABNORMAL HIGH (ref 0.44–1.00)
Glucose, Bld: 153 mg/dL — ABNORMAL HIGH (ref 70–99)
HCT: 33 % — ABNORMAL LOW (ref 36.0–46.0)
Hemoglobin: 11.2 g/dL — ABNORMAL LOW (ref 12.0–15.0)
Potassium: 4.2 mmol/L (ref 3.5–5.1)
Sodium: 136 mmol/L (ref 135–145)
TCO2: 28 mmol/L (ref 22–32)

## 2021-02-05 LAB — CBC WITH DIFFERENTIAL/PLATELET
Abs Immature Granulocytes: 0.02 10*3/uL (ref 0.00–0.07)
Basophils Absolute: 0 10*3/uL (ref 0.0–0.1)
Basophils Relative: 1 %
Eosinophils Absolute: 0 10*3/uL (ref 0.0–0.5)
Eosinophils Relative: 0 %
HCT: 31.4 % — ABNORMAL LOW (ref 36.0–46.0)
Hemoglobin: 10.7 g/dL — ABNORMAL LOW (ref 12.0–15.0)
Immature Granulocytes: 0 %
Lymphocytes Relative: 12 %
Lymphs Abs: 1 10*3/uL (ref 0.7–4.0)
MCH: 27.3 pg (ref 26.0–34.0)
MCHC: 34.1 g/dL (ref 30.0–36.0)
MCV: 80.1 fL (ref 80.0–100.0)
Monocytes Absolute: 1 10*3/uL (ref 0.1–1.0)
Monocytes Relative: 11 %
Neutro Abs: 6.4 10*3/uL (ref 1.7–7.7)
Neutrophils Relative %: 76 %
Platelets: 261 10*3/uL (ref 150–400)
RBC: 3.92 MIL/uL (ref 3.87–5.11)
RDW: 15.7 % — ABNORMAL HIGH (ref 11.5–15.5)
WBC: 8.5 10*3/uL (ref 4.0–10.5)
nRBC: 0 % (ref 0.0–0.2)

## 2021-02-05 LAB — URINALYSIS, ROUTINE W REFLEX MICROSCOPIC
Bacteria, UA: NONE SEEN
Bilirubin Urine: NEGATIVE
Glucose, UA: NEGATIVE mg/dL
Ketones, ur: NEGATIVE mg/dL
Leukocytes,Ua: NEGATIVE
Nitrite: NEGATIVE
Protein, ur: NEGATIVE mg/dL
Specific Gravity, Urine: >1.046 — ABNORMAL HIGH (ref 1.005–1.030)
pH: 6 (ref 5.0–8.0)

## 2021-02-05 LAB — COMPREHENSIVE METABOLIC PANEL
ALT: 13 U/L (ref 0–44)
AST: 21 U/L (ref 15–41)
Albumin: 3.7 g/dL (ref 3.5–5.0)
Alkaline Phosphatase: 61 U/L (ref 38–126)
Anion gap: 6 (ref 5–15)
BUN: 16 mg/dL (ref 8–23)
CO2: 27 mmol/L (ref 22–32)
Calcium: 9.7 mg/dL (ref 8.9–10.3)
Chloride: 102 mmol/L (ref 98–111)
Creatinine, Ser: 1.09 mg/dL — ABNORMAL HIGH (ref 0.44–1.00)
GFR, Estimated: 51 mL/min — ABNORMAL LOW (ref >60)
Glucose, Bld: 152 mg/dL — ABNORMAL HIGH (ref 70–99)
Potassium: 4.1 mmol/L (ref 3.5–5.1)
Sodium: 135 mmol/L (ref 135–145)
Total Bilirubin: 1 mg/dL (ref 0.3–1.2)
Total Protein: 6.9 g/dL (ref 6.5–8.1)

## 2021-02-05 LAB — PROTIME-INR
INR: 2 — ABNORMAL HIGH (ref 0.8–1.2)
Prothrombin Time: 21.8 seconds — ABNORMAL HIGH (ref 11.4–15.2)

## 2021-02-05 LAB — TROPONIN I (HIGH SENSITIVITY)
Troponin I (High Sensitivity): 13 ng/L (ref <18)
Troponin I (High Sensitivity): 18 ng/L — ABNORMAL HIGH (ref <18)

## 2021-02-05 LAB — LACTIC ACID, PLASMA
Lactic Acid, Venous: 1.3 mmol/L (ref 0.5–1.9)
Lactic Acid, Venous: 1.1 mmol/L (ref 0.5–1.9)

## 2021-02-05 LAB — RESP PANEL BY RT-PCR (FLU A&B, COVID) ARPGX2
Influenza A by PCR: NEGATIVE
Influenza B by PCR: NEGATIVE
SARS Coronavirus 2 by RT PCR: NEGATIVE

## 2021-02-05 LAB — LIPASE, BLOOD: Lipase: 47 U/L (ref 11–51)

## 2021-02-05 MED ORDER — ONDANSETRON HCL 4 MG/2ML IJ SOLN: 4.0000 mg | Freq: Four times a day (QID) | INTRAMUSCULAR | Status: DC | PRN

## 2021-02-05 MED ORDER — SODIUM CHLORIDE 0.9 % IV BOLUS
1000.0000 mL | Freq: Once | INTRAVENOUS | Status: AC
  Administered 2021-02-05: 1000 mL via INTRAVENOUS

## 2021-02-05 MED ORDER — ACETAMINOPHEN 500 MG PO TABS
1000.0000 mg | ORAL_TABLET | Freq: Once | ORAL | Status: AC
  Administered 2021-02-05: 1000 mg via ORAL
  Filled 2021-02-05: qty 2

## 2021-02-05 MED ORDER — INSULIN ASPART 100 UNIT/ML ~~LOC~~ SOLN
0.0000 [IU] | Freq: Three times a day (TID) | SUBCUTANEOUS | Status: DC
  Administered 2021-02-06 – 2021-02-08 (×3): 2 [IU] via SUBCUTANEOUS
  Administered 2021-02-10: 1 [IU] via SUBCUTANEOUS
  Administered 2021-02-10: 2 [IU] via SUBCUTANEOUS
  Administered 2021-02-11: 1 [IU] via SUBCUTANEOUS
  Administered 2021-02-11: 2 [IU] via SUBCUTANEOUS

## 2021-02-05 MED ORDER — ONDANSETRON HCL 4 MG PO TABS: 4.0000 mg | ORAL_TABLET | Freq: Four times a day (QID) | ORAL | Status: DC | PRN

## 2021-02-05 MED ORDER — DILTIAZEM LOAD VIA INFUSION
20.0000 mg | Freq: Once | INTRAVENOUS | Status: AC
  Administered 2021-02-05: 20 mg via INTRAVENOUS
  Filled 2021-02-05: qty 20

## 2021-02-05 MED ORDER — IOHEXOL 300 MG/ML  SOLN
100.0000 mL | Freq: Once | INTRAMUSCULAR | Status: AC | PRN
  Administered 2021-02-05: 100 mL via INTRAVENOUS

## 2021-02-05 MED ORDER — ACETAMINOPHEN 650 MG RE SUPP: 650.0000 mg | Freq: Four times a day (QID) | RECTAL | Status: DC | PRN

## 2021-02-05 MED ORDER — HYDROCODONE-ACETAMINOPHEN 5-325 MG PO TABS
1.0000 | ORAL_TABLET | Freq: Four times a day (QID) | ORAL | Status: DC | PRN
  Administered 2021-02-06 – 2021-02-07 (×5): 1 via ORAL
  Filled 2021-02-05 (×6): qty 1

## 2021-02-05 MED ORDER — DILTIAZEM HCL ER COATED BEADS 180 MG PO CP24
180.0000 mg | ORAL_CAPSULE | Freq: Every day | ORAL | Status: DC
  Administered 2021-02-06: 180 mg via ORAL
  Filled 2021-02-05: qty 1

## 2021-02-05 MED ORDER — DILTIAZEM HCL-DEXTROSE 125-5 MG/125ML-% IV SOLN (PREMIX)
5.0000 mg/h | INTRAVENOUS | Status: DC
  Administered 2021-02-05 – 2021-02-06 (×2): 5 mg/h via INTRAVENOUS
  Filled 2021-02-05 (×2): qty 125

## 2021-02-05 MED ORDER — ACETAMINOPHEN 325 MG PO TABS
650.0000 mg | ORAL_TABLET | Freq: Four times a day (QID) | ORAL | Status: DC | PRN
  Administered 2021-02-06: 650 mg via ORAL
  Filled 2021-02-05: qty 2

## 2021-02-05 NOTE — ED Notes (Signed)
Patient to ED bed 16 at this time. AxO4, RR even and unlabored, HR A fib w/ RvR at 200s. ED MD at bedside. Vagal maneuvers performed, patient HR decreased to 140s-150s.

## 2021-02-05 NOTE — H&P (Incomplete)
History and Physical    Beth Miller EVO:[id number] DOB: Oct 19, 1941 DOA: 02/05/2021  PCP: Cassandria Anger, MD   Patient coming from: Home   Chief Complaint: Palpitations, low back pain, diarrhea   HPI: Beth Miller is a 80 y.o. female with medical history significant for mitral valve replacement, atrial fibrillation on warfarin, chronic diastolic CHF, type 2 diabetes mellitus, and hypertension, now presenting to the emergency department for evaluation of palpitations, low back pain, diarrhea, and fatigue.  Patient reports that she had been in her usual state until approximately 3 days ago when she developed watery diarrhea and pain in the low back.  Diarrhea seems to be improving with no episodes yet today but she has ongoing low back pain and has developed palpitations and general malaise and fatigue.  There has not been any abdominal pain or vomiting, and no melena or hematochezia.  She denies any recent travel or sick contacts.  She notes that she has had bouts of diarrhea multiple times in the last few months.  She denies chest pain but can feel her heart racing.  The low back pain is on the left side near her hip, worse with movement, and without any inciting event that she can recall.  ED Course: Upon arrival to the ED, patient is found to be febrile to 38.6 C, tachycardic to 160, and with blood pressure 114/83.  EKG features atrial fibrillation with rate 162.  Chest x-ray negative for acute cardiopulmonary disease.  No acute intra-abdominal or pelvic abnormality noted on CT.  Chemistry panel with creatinine 1.09.  CBC with hemoglobin 10.7.  Lactic acid is normal.  INR is 2.0.  High-sensitivity troponin is 18.  Review of Systems:  All other systems reviewed and apart from HPI, are negative.  Past Medical History:  Diagnosis Date  . AF (atrial fibrillation) ([place])   . ALLERGIC RHINITIS   . Blood transfusion without reported diagnosis   . Cataract, senile   . Chest pain   . CHF  (congestive heart failure) ([place])   . Chronic anticoagulation   . Diabetes mellitus   . Diabetes mellitus   . Disorder of oral soft tissue    of mouth  . Diverticulosis   . GERD (gastroesophageal reflux disease)   . Hematoma   . Hypertension   . Leg pain   . Low back pain   . Paresthesia   . Rheumatic fever   . Status post mitral valve replacement    [employer]. Jude valve  . Stroke ([place])    tia  . Sweating   . Vitamin D deficiency     Past Surgical History:  Procedure Laterality Date  . ABDOMINAL HYSTERECTOMY    . CARDIAC VALVE REPLACEMENT  1995   Mitral valve prosthesis; st jude  . CHOLECYSTECTOMY      Social History:   reports that she quit smoking about 18 years ago. Her smoking use included cigarettes. She has a 1.20 pack-year smoking history. She has never used smokeless tobacco. She reports that she does not drink alcohol and does not use drugs.  Allergies  Allergen Reactions  . Aricept [Donepezil Hcl]     Intolerance - bad dreams  . Food     Bananas and Fish, cause sinus drainage  . Furosemide     cramps  . Quinine Other (See Comments)    Can't hear  . Spironolactone     constipation  . Tramadol     headache  . Sulfadiazine Itching  and Rash    Family History  Problem Relation Age of Onset  . Diabetes Mother   . Seizures Mother   . Other Mother        brain tumor  . Heart disease Brother   . Diabetes Brother   . Heart disease Brother   . Pancreatic cancer Brother   . Colon cancer Neg Hx      Prior to Admission medications   Medication Sig Start Date End Date Taking? Authorizing Provider  ACCU-CHEK AVIVA PLUS test strip TEST BLOOD SUGAR TWICE DAILY 07/17/20   Plotnikov, Evie Lacks, MD  ACCU-CHEK SOFTCLIX LANCETS lancets 1 each by Other route 2 (two) times daily. Use to check blood sugars twice a day 06/18/17   Plotnikov, Evie Lacks, MD  acetaminophen (TYLENOL) 500 MG tablet Take 1,000 mg by mouth every 6 (six) hours as needed for mild pain.    [provider]  Alcohol Swabs (B-D SINGLE USE SWABS REGULAR) PADS 1 each by Does not apply route 2 (two) times daily. Use to clean finger to check blood sugars twice a day 06/18/17   Plotnikov, Evie Lacks, MD  amoxicillin (AMOXIL) 500 MG tablet Take four capsules (2,000 mg) one hour prior to all dental visits. 11/25/19   Josue Hector, MD  Blood Glucose Calibration (ACCU-CHEK AVIVA) SOLN 1 each by In Vitro route as directed. 06/18/17   Plotnikov, Evie Lacks, MD  Blood Glucose Monitoring Suppl (ACCU-CHEK AVIVA PLUS) w/Device KIT 1 each by Does not apply route 2 (two) times daily. Use as directed 05/26/19   Plotnikov, Evie Lacks, MD  Cholecalciferol (VITAMIN D3) 1000 UNIT tablet Take 1,000 Units by mouth daily.    [provider]  diltiazem (CARDIZEM CD) 180 MG 24 hr capsule Take 1 capsule (180 mg total) by mouth daily. Take in am 01/30/21   Plotnikov, Evie Lacks, MD  furosemide (LASIX) 20 MG tablet Take 1 tablet (20 mg total) by mouth daily. Take in am 01/30/21   Plotnikov, Evie Lacks, MD  gabapentin (NEURONTIN) 100 MG capsule Take 1 capsule (100 mg total) by mouth at bedtime. 01/30/21   Plotnikov, Evie Lacks, MD  HYDROcodone-acetaminophen (NORCO/VICODIN) 5-325 MG tablet Take 0.5-1 tablets by mouth every 6 (six) hours as needed for severe pain. 10/09/20 10/09/21  Plotnikov, Evie Lacks, MD  Lancet Devices [facility]) lancets 1 each by Other route 2 (two) times daily. Use as instructed 12/04/15   Plotnikov, Evie Lacks, MD  loratadine (CLARITIN) 10 MG tablet Take 1 tablet (10 mg total) by mouth daily as needed for allergies. Take in am 04/26/19   Plotnikov, Evie Lacks, MD  memantine (NAMENDA) 5 MG tablet Take 1 tablet (5 mg total) by mouth 2 (two) times daily. 01/30/21   Plotnikov, Evie Lacks, MD  metFORMIN (GLUCOPHAGE) 500 MG tablet Take 1 tablet (500 mg total) by mouth daily with breakfast. 01/30/21   Plotnikov, Evie Lacks, MD  methylPREDNISolone (MEDROL DOSEPAK) 4 MG TBPK tablet As directed 10/09/20    Plotnikov, Evie Lacks, MD  mirabegron ER (MYRBETRIQ) 25 MG TB24 tablet Take 1 tablet (25 mg total) by mouth daily. 01/30/21   Plotnikov, Evie Lacks, MD  ondansetron (ZOFRAN) 4 MG tablet Take 1 tablet (4 mg total) by mouth every 8 (eight) hours as needed for nausea or vomiting. 10/24/16   Nche, Charlene Brooke, NP  OVER THE COUNTER MEDICATION Take 1 tablet by mouth as needed (joint pain).     [provider]  pantoprazole (PROTONIX) 40 MG tablet  Take 1 tablet (40 mg total) by mouth daily. Take in am 01/30/21   Plotnikov, Evie Lacks, MD  potassium chloride (KLOR-CON) 10 MEQ tablet Take 1 tablet (10 mEq total) by mouth daily. 01/30/21   Plotnikov, Evie Lacks, MD  vitamin B-12 (CYANOCOBALAMIN) 1000 MCG tablet Take 1,000 mcg by mouth daily as needed (energy).    [provider]  warfarin (COUMADIN) 10 MG tablet As directed 01/30/21   Plotnikov, Evie Lacks, MD  warfarin (COUMADIN) 5 MG tablet Take 1 tablet twice weekly along with 25m dose or as advised by Coumadin clinic. 01/30/21   Plotnikov, AEvie Lacks MD    Physical Exam: Vitals:   02/05/21 2245 02/05/21 2300 02/05/21 2315 02/05/21 2330  BP: 104/64 104/72 103/77 110/68  Pulse: 100 (!) 101 99 99  Resp: (!) 25 (!) 24 (!) 22 (!) 24  Temp:    98.9 F (37.2 C)  TempSrc:    Oral  SpO2: 94% 94% 95% 95%     Constitutional: NAD, calm  Eyes: PERTLA, lids and conjunctivae normal ENMT: Mucous membranes are moist. Posterior pharynx clear of any exudate or lesions.   Neck: normal, supple, no masses, no thyromegaly Respiratory: clear to auscultation bilaterally, no wheezing, no crackles. No accessory muscle use.  Cardiovascular: S1 & S2 heard, regular rate and rhythm. No extremity edema. No significant JVD. Abdomen: No distension, no tenderness, soft. Bowel sounds active.  Musculoskeletal: no clubbing / cyanosis. No joint deformity upper and lower extremities.   Skin: no significant rashes, lesions, ulcers. Warm, dry, well-perfused. Neurologic: CN  2-12 grossly intact. Sensation intact, DTR normal. Strength 5/5 in all 4 limbs.  Psychiatric: Alert and oriented to person, place, and situation. Pleasant and cooperative.    Labs and Imaging on Admission: I have personally reviewed following labs and imaging studies  CBC: Recent Labs  Lab 01/30/21 1041 02/05/21 1814 02/05/21 1911  WBC 5.3 8.5  --   NEUTROABS 3.1 6.4  --   HGB 11.0* 10.7* 11.2*  HCT 33.0* 31.4* 33.0*  MCV 80.4 80.1  --   PLT 239.0 261  --    Basic Metabolic Panel: Recent Labs  Lab 01/30/21 1041 02/05/21 1814 02/05/21 1911  NA 141 135 136  K 4.3 4.1 4.2  CL 103 102 102  CO2 33* 27  --   GLUCOSE 132* 152* 153*  BUN _0 CREATININE 0.92 1.09* 1.10*  CALCIUM 9.7 9.7  --    GFR: Estimated Creatinine Clearance: 39.6 mL/min (A) (by C-G formula based on SCr of 1.1 mg/dL (H)). Liver Function Tests: Recent Labs  Lab 01/30/21 1041 02/05/21 1814  AST 20 21  ALT 12 13  ALKPHOS 73 61  BILITOT 0.4 1.0  PROT 6.7 6.9  ALBUMIN 4.0 3.7   Recent Labs  Lab 02/05/21 1814  LIPASE 47   No results for input(s): AMMONIA in the last 168 hours. Coagulation Profile: Recent Labs  Lab 02/05/21 1814  INR 2.0*   Cardiac Enzymes: No results for input(s): CKTOTAL, CKMB, CKMBINDEX, TROPONINI in the last 168 hours. BNP (last 3 results) No results for input(s): PROBNP in the last 8760 hours. HbA1C: No results for input(s): HGBA1C in the last 72 hours. CBG: No results for input(s): GLUCAP in the last 168 hours. Lipid Profile: No results for input(s): CHOL, HDL, LDLCALC, TRIG, CHOLHDL, LDLDIRECT in the last 72 hours. Thyroid Function Tests: No results for input(s): TSH, T4TOTAL, FREET4, T3FREE, THYROIDAB in the last 72 hours. Anemia Panel: No results for  input(s): VITAMINB12, FOLATE, FERRITIN, TIBC, IRON, RETICCTPCT in the last 72 hours. Urine analysis:    Component Value Date/Time   COLORURINE YELLOW 02/05/2021 2257   APPEARANCEUR CLEAR 02/05/2021 2257    LABSPEC >1.046 (H) 02/05/2021 2257   PHURINE 6.0 02/05/2021 2257   GLUCOSEU NEGATIVE 02/05/2021 2257   GLUCOSEU NEGATIVE 04/27/2019 1041   HGBUR MODERATE (A) 02/05/2021 2257   BILIRUBINUR NEGATIVE 02/05/2021 2257   KETONESUR NEGATIVE 02/05/2021 2257   PROTEINUR NEGATIVE 02/05/2021 2257   UROBILINOGEN 0.2 04/27/2019 1041   NITRITE NEGATIVE 02/05/2021 2257   LEUKOCYTESUR NEGATIVE 02/05/2021 2257   Sepsis Labs: _0 (procalcitonin:4,lacticidven:4) ) Recent Results (from the past 240 hour(s))  Resp Panel by RT-PCR (Flu A&B, Covid) Nasopharyngeal Swab     Status: None   Collection Time: 02/05/21 10:30 PM   Specimen: Nasopharyngeal Swab; Nasopharyngeal(NP) swabs in vial transport medium  Result Value Ref Range Status   SARS Coronavirus 2 by RT PCR NEGATIVE NEGATIVE Final    Comment: (NOTE) SARS-CoV-2 target nucleic acids are NOT DETECTED.  The SARS-CoV-2 RNA is generally detectable in upper respiratory specimens during the acute phase of infection. The lowest concentration of SARS-CoV-2 viral copies this assay can detect is 138 copies/mL. A negative result does not preclude SARS-Cov-2 infection and should not be used as the sole basis for treatment or other patient management decisions. A negative result may occur with  improper specimen collection/handling, submission of specimen other than nasopharyngeal swab, presence of viral mutation(s) within the areas targeted by this assay, and inadequate number of viral copies(<138 copies/mL). A negative result must be combined with clinical observations, patient history, and epidemiological information. The expected result is Negative.  Fact Sheet for Patients:  EntrepreneurPulse.com.au  Fact Sheet for Healthcare Providers:  IncredibleEmployment.be  This test is no t yet approved or cleared by the [place] FDA and  has been authorized for detection and/or diagnosis of SARS-CoV-2 by FDA  under an Emergency Use Authorization (EUA). This EUA will remain  in effect (meaning this test can be used) for the duration of the COVID-19 declaration under Section 564(b)(1) of the Act, 21 U.S.C.section 360bbb-3(b)(1), unless the authorization is terminated  or revoked sooner.       Influenza A by PCR NEGATIVE NEGATIVE Final   Influenza B by PCR NEGATIVE NEGATIVE Final    Comment: (NOTE) The Xpert Xpress SARS-CoV-2/FLU/RSV plus assay is intended as an aid in the diagnosis of influenza from Nasopharyngeal swab specimens and should not be used as a sole basis for treatment. Nasal washings and aspirates are unacceptable for Xpert Xpress SARS-CoV-2/FLU/RSV testing.  Fact Sheet for Patients: EntrepreneurPulse.com.au  Fact Sheet for Healthcare Providers: IncredibleEmployment.be  This test is not yet approved or cleared by the [place] FDA and has been authorized for detection and/or diagnosis of SARS-CoV-2 by FDA under an Emergency Use Authorization (EUA). This EUA will remain in effect (meaning this test can be used) for the duration of the COVID-19 declaration under Section 564(b)(1) of the Act, 21 U.S.C. section 360bbb-3(b)(1), unless the authorization is terminated or revoked.  Performed at [facility], [place] [address] [place], [place] [address]      Radiological Exams on Admission: CT Abdomen Pelvis W Contrast  Result Date: 02/05/2021 CLINICAL DATA:  Left-sided back and abdominal pain EXAM: CT ABDOMEN AND PELVIS WITH CONTRAST TECHNIQUE: Multidetector CT imaging of the abdomen and pelvis was performed using the standard protocol following bolus administration of intravenous contrast. CONTRAST:  125m OMNIPAQUE IOHEXOL 300 MG/ML  SOLN COMPARISON:  CT 09/22/2018 FINDINGS: Lower chest: Lung bases demonstrate no acute consolidation or effusion. Mild fibrosis within the medial right lung base. Cardiomegaly with biatrial enlargement.  Mitral valve prosthesis. Hepatobiliary: Status post cholecystectomy. No biliary dilatation. No focal hepatic abnormality Pancreas: Unremarkable. No pancreatic ductal dilatation or surrounding inflammatory changes. Spleen: Normal in size without focal abnormality. Adrenals/Urinary Tract: Adrenal glands are normal. 6.3 cm cyst in the upper pole left kidney. Probable cyst mid right kidney. The urinary bladder is unremarkable. Subcentimeter hypodense renal lesions too small to further characterize. Stomach/Bowel: Stomach is within normal limits. Appendix appears normal. No evidence of bowel wall thickening, distention, or inflammatory changes. Vascular/Lymphatic: Mild aortic atherosclerosis. No aneurysm. No suspicious nodes. Reproductive: Status post hysterectomy. No adnexal masses. Other: Negative for free air or free fluid. Small upper abdominal ventral hernia containing fat. Small amount of fluid in the hernia sac. Musculoskeletal: Stable sclerotic lesion within the right iliac bone measuring 18 mm, felt benign given lack of interval change. IMPRESSION: 1. No CT evidence for acute intra-abdominal or pelvic abnormality. 2. Cardiomegaly with biatrial enlargement. Aortic Atherosclerosis (ICD10-I70.0). Electronically Signed   By: Donavan Foil M.D.   On: 02/05/2021 21:17   DG Chest Portable 1 View  Result Date: 02/05/2021 CLINICAL DATA:  Chest pain and tachycardia. EXAM: PORTABLE CHEST 1 VIEW COMPARISON:  November 19, 2020 FINDINGS: Multiple sternal wires are noted. The cardiac silhouette is markedly enlarged and unchanged in size. Both lungs are clear. The visualized skeletal structures are unremarkable. IMPRESSION: Stable cardiomegaly without acute or active cardiopulmonary disease. Electronically Signed   By: Virgina Norfolk M.D.   On: 02/05/2021 19:37    EKG: Independently reviewed. ***  Assessment/Plan   1. Atrial fibrillation with RVR  -  -  -  -  -  -  -  -   2. SIRS  -  -  -  -  -  -   -   3. Diarrhea  -  -  -  -  -  -  -   4. CKD IIIa  -  -  -  -  -  -  -  -   5. Type II DM  -  -  -  -  -  -  -   6. Chronic diastolic CHF  -  -  -  -  -   7.  -  -  -  -  -  -   8.  -  -  -  -  -  -     DVT prophylaxis: warfarin  Code Status: Full  Level of Care: Level of care: Progressive Family Communication: None available  Disposition Plan:  Patient is from: home  Anticipated d/c is to: Home  Anticipated d/c date is: 02/07/21 Patient currently: Pending rate-control, improvement in sepsis parameters  Consults called: none  Admission status: Inpatient     Vianne Bulls, MD [facility]  02/05/2021, 11:52 PM

## 2021-02-05 NOTE — ED Triage Notes (Signed)
C/O severe pain on left flank and back pain, reported hx of irregular HR as well.
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
## 2021-02-05 NOTE — ED Notes (Signed)
Attempted report x1 at this time

## 2021-02-05 NOTE — H&P (Signed)
History and Physical    Beth Miller ZOX:[id number] DOB: Feb 15, 1941 DOA: 02/05/2021  PCP: Cassandria Anger, MD   Patient coming from: Home   Chief Complaint: Palpitations, low back pain, diarrhea   HPI: Beth Miller is a 80 y.o. female with medical history significant for mitral valve replacement, atrial fibrillation on warfarin, chronic diastolic CHF, type 2 diabetes mellitus, and hypertension, now presenting to the emergency department for evaluation of palpitations, low back pain, diarrhea, and fatigue.  Patient reports that she had been in her usual state until approximately 3 days ago when she developed watery diarrhea and pain in the low back.  Diarrhea seems to be improving with no episodes yet today but she has ongoing low back pain and has developed palpitations and general malaise and fatigue.  There has not been any abdominal pain or vomiting, and no melena or hematochezia.  She denies any recent travel or sick contacts.  She notes that she has had bouts of diarrhea multiple times in the last few months.  She denies chest pain but can feel her heart racing.  The low back pain is on the left side near her hip, worse with movement, and without any inciting event that she can recall.  ED Course: Upon arrival to the ED, patient is found to be febrile to 38.6 C, tachycardic to 160, and with blood pressure 114/83.  EKG features atrial fibrillation with rate 162.  Chest x-ray negative for acute cardiopulmonary disease.  No acute intra-abdominal or pelvic abnormality noted on CT.  Chemistry panel with creatinine 1.09.  CBC with hemoglobin 10.7.  Lactic acid is normal.  INR is 2.0.  High-sensitivity troponin is 18.  Blood cultures were collected in the ED and the patient was given a liter of saline, 20 mg IV diltiazem, and was started on diltiazem infusion.  Review of Systems:  All other systems reviewed and apart from HPI, are negative.  Past Medical History:  Diagnosis Date  . AF  (atrial fibrillation) ([place])   . ALLERGIC RHINITIS   . Blood transfusion without reported diagnosis   . Cataract, senile   . Chest pain   . CHF (congestive heart failure) ([place])   . Chronic anticoagulation   . Diabetes mellitus   . Diabetes mellitus   . Disorder of oral soft tissue    of mouth  . Diverticulosis   . GERD (gastroesophageal reflux disease)   . Hematoma   . Hypertension   . Leg pain   . Low back pain   . Paresthesia   . Rheumatic fever   . Status post mitral valve replacement    St. Jude valve  . Stroke ([place])    tia  . Sweating   . Vitamin D deficiency     Past Surgical History:  Procedure Laterality Date  . ABDOMINAL HYSTERECTOMY    . CARDIAC VALVE REPLACEMENT  1995   Mitral valve prosthesis; st jude  . CHOLECYSTECTOMY      Social History:   reports that she quit smoking about 18 years ago. Her smoking use included cigarettes. She has a 1.20 pack-year smoking history. She has never used smokeless tobacco. She reports that she does not drink alcohol and does not use drugs.  Allergies  Allergen Reactions  . Aricept [Donepezil Hcl]     Intolerance - bad dreams  . Food     Bananas and Fish, cause sinus drainage  . Furosemide     cramps  . Quinine Other (  See Comments)    Can't hear  . Spironolactone     constipation  . Tramadol     headache  . Sulfadiazine Itching and Rash    Family History  Problem Relation Age of Onset  . Diabetes Mother   . Seizures Mother   . Other Mother        brain tumor  . Heart disease Brother   . Diabetes Brother   . Heart disease Brother   . Pancreatic cancer Brother   . Colon cancer Neg Hx      Prior to Admission medications   Medication Sig Start Date End Date Taking? Authorizing Provider  ACCU-CHEK AVIVA PLUS test strip TEST BLOOD SUGAR TWICE DAILY 07/17/20   Plotnikov, Evie Lacks, MD  ACCU-CHEK SOFTCLIX LANCETS lancets 1 each by Other route 2 (two) times daily. Use to check blood sugars twice a day 06/18/17    Plotnikov, Evie Lacks, MD  acetaminophen (TYLENOL) 500 MG tablet Take 1,000 mg by mouth every 6 (six) hours as needed for mild pain.    [provider]  Alcohol Swabs (B-D SINGLE USE SWABS REGULAR) PADS 1 each by Does not apply route 2 (two) times daily. Use to clean finger to check blood sugars twice a day 06/18/17   Plotnikov, Evie Lacks, MD  amoxicillin (AMOXIL) 500 MG tablet Take four capsules (2,000 mg) one hour prior to all dental visits. 11/25/19   Josue Hector, MD  Blood Glucose Calibration (ACCU-CHEK AVIVA) SOLN 1 each by In Vitro route as directed. 06/18/17   Plotnikov, Evie Lacks, MD  Blood Glucose Monitoring Suppl (ACCU-CHEK AVIVA PLUS) w/Device KIT 1 each by Does not apply route 2 (two) times daily. Use as directed 05/26/19   Plotnikov, Evie Lacks, MD  Cholecalciferol (VITAMIN D3) 1000 UNIT tablet Take 1,000 Units by mouth daily.    [provider]  diltiazem (CARDIZEM CD) 180 MG 24 hr capsule Take 1 capsule (180 mg total) by mouth daily. Take in am 01/30/21   Plotnikov, Evie Lacks, MD  furosemide (LASIX) 20 MG tablet Take 1 tablet (20 mg total) by mouth daily. Take in am 01/30/21   Plotnikov, Evie Lacks, MD  gabapentin (NEURONTIN) 100 MG capsule Take 1 capsule (100 mg total) by mouth at bedtime. 01/30/21   Plotnikov, Evie Lacks, MD  HYDROcodone-acetaminophen (NORCO/VICODIN) 5-325 MG tablet Take 0.5-1 tablets by mouth every 6 (six) hours as needed for severe pain. 10/09/20 10/09/21  Plotnikov, Evie Lacks, MD  Lancet Devices [facility]) lancets 1 each by Other route 2 (two) times daily. Use as instructed 12/04/15   Plotnikov, Evie Lacks, MD  loratadine (CLARITIN) 10 MG tablet Take 1 tablet (10 mg total) by mouth daily as needed for allergies. Take in am 04/26/19   Plotnikov, Evie Lacks, MD  memantine (NAMENDA) 5 MG tablet Take 1 tablet (5 mg total) by mouth 2 (two) times daily. 01/30/21   Plotnikov, Evie Lacks, MD  metFORMIN (GLUCOPHAGE) 500 MG tablet Take 1 tablet (500 mg total) by  mouth daily with breakfast. 01/30/21   Plotnikov, Evie Lacks, MD  methylPREDNISolone (MEDROL DOSEPAK) 4 MG TBPK tablet As directed 10/09/20   Plotnikov, Evie Lacks, MD  mirabegron ER (MYRBETRIQ) 25 MG TB24 tablet Take 1 tablet (25 mg total) by mouth daily. 01/30/21   Plotnikov, Evie Lacks, MD  ondansetron (ZOFRAN) 4 MG tablet Take 1 tablet (4 mg total) by mouth every 8 (eight) hours as needed for nausea or vomiting. 10/24/16   Nche, Charlene Brooke, NP  OVER THE COUNTER MEDICATION Take 1 tablet by mouth as needed (joint pain).     [provider]  pantoprazole (PROTONIX) 40 MG tablet Take 1 tablet (40 mg total) by mouth daily. Take in am 01/30/21   Plotnikov, Evie Lacks, MD  potassium chloride (KLOR-CON) 10 MEQ tablet Take 1 tablet (10 mEq total) by mouth daily. 01/30/21   Plotnikov, Evie Lacks, MD  vitamin B-12 (CYANOCOBALAMIN) 1000 MCG tablet Take 1,000 mcg by mouth daily as needed (energy).    [provider]  warfarin (COUMADIN) 10 MG tablet As directed 01/30/21   Plotnikov, Evie Lacks, MD  warfarin (COUMADIN) 5 MG tablet Take 1 tablet twice weekly along with 43m dose or as advised by Coumadin clinic. 01/30/21   Plotnikov, AEvie Lacks MD    Physical Exam: Vitals:   02/05/21 2245 02/05/21 2300 02/05/21 2315 02/05/21 2330  BP: 104/64 104/72 103/77 110/68  Pulse: 100 (!) 101 99 99  Resp: (!) 25 (!) 24 (!) 22 (!) 24  Temp:    98.9 F (37.2 C)  TempSrc:    Oral  SpO2: 94% 94% 95% 95%    Constitutional: NAD, calm  Eyes: PERTLA, lids and conjunctivae normal ENMT: Mucous membranes are moist. Posterior pharynx clear of any exudate or lesions.   Neck: normal, supple, no masses, no thyromegaly Respiratory:  no wheezing, no crackles. No accessory muscle use.  Cardiovascular: S1 & S2 heard, regular rate and rhythm. No extremity edema.   Abdomen: No distension, no tenderness, soft. Bowel sounds active.  Musculoskeletal: no clubbing / cyanosis. No joint deformity upper and lower extremities.    Skin: no significant rashes, lesions, ulcers. Warm, dry, well-perfused. Neurologic: CN 2-12 grossly intact. Sensation intact. Moving all extremities.  Psychiatric: Alert and oriented to person, place, and situation. Very pleasant and cooperative.    Labs and Imaging on Admission: I have personally reviewed following labs and imaging studies  CBC: Recent Labs  Lab 01/30/21 1041 02/05/21 1814 02/05/21 1911  WBC 5.3 8.5  --   NEUTROABS 3.1 6.4  --   HGB 11.0* 10.7* 11.2*  HCT 33.0* 31.4* 33.0*  MCV 80.4 80.1  --   PLT 239.0 261  --    Basic Metabolic Panel: Recent Labs  Lab 01/30/21 1041 02/05/21 1814 02/05/21 1911  NA 141 135 136  K 4.3 4.1 4.2  CL 103 102 102  CO2 33* 27  --   GLUCOSE 132* 152* 153*  BUN 14 16 17   CREATININE 0.92 1.09* 1.10*  CALCIUM 9.7 9.7  --    GFR: Estimated Creatinine Clearance: 39.6 mL/min (A) (by C-G formula based on SCr of 1.1 mg/dL (H)). Liver Function Tests: Recent Labs  Lab 01/30/21 1041 02/05/21 1814  AST 20 21  ALT 12 13  ALKPHOS 73 61  BILITOT 0.4 1.0  PROT 6.7 6.9  ALBUMIN 4.0 3.7   Recent Labs  Lab 02/05/21 1814  LIPASE 47   No results for input(s): AMMONIA in the last 168 hours. Coagulation Profile: Recent Labs  Lab 02/05/21 1814  INR 2.0*   Cardiac Enzymes: No results for input(s): CKTOTAL, CKMB, CKMBINDEX, TROPONINI in the last 168 hours. BNP (last 3 results) No results for input(s): PROBNP in the last 8760 hours. HbA1C: No results for input(s): HGBA1C in the last 72 hours. CBG: No results for input(s): GLUCAP in the last 168 hours. Lipid Profile: No results for input(s): CHOL, HDL, LDLCALC, TRIG, CHOLHDL, LDLDIRECT in the last 72 hours. Thyroid Function Tests: No results  for input(s): TSH, T4TOTAL, FREET4, T3FREE, THYROIDAB in the last 72 hours. Anemia Panel: No results for input(s): VITAMINB12, FOLATE, FERRITIN, TIBC, IRON, RETICCTPCT in the last 72 hours. Urine analysis:    Component Value Date/Time    COLORURINE YELLOW 02/05/2021 2257   APPEARANCEUR CLEAR 02/05/2021 2257   LABSPEC >1.046 (H) 02/05/2021 2257   PHURINE 6.0 02/05/2021 2257   GLUCOSEU NEGATIVE 02/05/2021 2257   GLUCOSEU NEGATIVE 04/27/2019 1041   HGBUR MODERATE (A) 02/05/2021 2257   BILIRUBINUR NEGATIVE 02/05/2021 2257   KETONESUR NEGATIVE 02/05/2021 2257   PROTEINUR NEGATIVE 02/05/2021 2257   UROBILINOGEN 0.2 04/27/2019 1041   NITRITE NEGATIVE 02/05/2021 2257   LEUKOCYTESUR NEGATIVE 02/05/2021 2257   Sepsis Labs: @LABRCNTIP (procalcitonin:4,lacticidven:4) ) Recent Results (from the past 240 hour(s))  Resp Panel by RT-PCR (Flu A&B, Covid) Nasopharyngeal Swab     Status: None   Collection Time: 02/05/21 10:30 PM   Specimen: Nasopharyngeal Swab; Nasopharyngeal(NP) swabs in vial transport medium  Result Value Ref Range Status   SARS Coronavirus 2 by RT PCR NEGATIVE NEGATIVE Final    Comment: (NOTE) SARS-CoV-2 target nucleic acids are NOT DETECTED.  The SARS-CoV-2 RNA is generally detectable in upper respiratory specimens during the acute phase of infection. The lowest concentration of SARS-CoV-2 viral copies this assay can detect is 138 copies/mL. A negative result does not preclude SARS-Cov-2 infection and should not be used as the sole basis for treatment or other patient management decisions. A negative result may occur with  improper specimen collection/handling, submission of specimen other than nasopharyngeal swab, presence of viral mutation(s) within the areas targeted by this assay, and inadequate number of viral copies(<138 copies/mL). A negative result must be combined with clinical observations, patient history, and epidemiological information. The expected result is Negative.  Fact Sheet for Patients:  EntrepreneurPulse.com.au  Fact Sheet for Healthcare Providers:  IncredibleEmployment.be  This test is no t yet approved or cleared by the [place] FDA and   has been authorized for detection and/or diagnosis of SARS-CoV-2 by FDA under an Emergency Use Authorization (EUA). This EUA will remain  in effect (meaning this test can be used) for the duration of the COVID-19 declaration under Section 564(b)(1) of the Act, 21 U.S.C.section 360bbb-3(b)(1), unless the authorization is terminated  or revoked sooner.       Influenza A by PCR NEGATIVE NEGATIVE Final   Influenza B by PCR NEGATIVE NEGATIVE Final    Comment: (NOTE) The Xpert Xpress SARS-CoV-2/FLU/RSV plus assay is intended as an aid in the diagnosis of influenza from Nasopharyngeal swab specimens and should not be used as a sole basis for treatment. Nasal washings and aspirates are unacceptable for Xpert Xpress SARS-CoV-2/FLU/RSV testing.  Fact Sheet for Patients: EntrepreneurPulse.com.au  Fact Sheet for Healthcare Providers: IncredibleEmployment.be  This test is not yet approved or cleared by the [place] FDA and has been authorized for detection and/or diagnosis of SARS-CoV-2 by FDA under an Emergency Use Authorization (EUA). This EUA will remain in effect (meaning this test can be used) for the duration of the COVID-19 declaration under Section 564(b)(1) of the Act, 21 U.S.C. section 360bbb-3(b)(1), unless the authorization is terminated or revoked.  Performed at [facility], [place] [address], [place], [place] [address]      Radiological Exams on Admission: CT Abdomen Pelvis W Contrast  Result Date: 02/05/2021 CLINICAL DATA:  Left-sided back and abdominal pain EXAM: CT ABDOMEN AND PELVIS WITH CONTRAST TECHNIQUE: Multidetector CT imaging of the abdomen and pelvis was performed using  the standard protocol following bolus administration of intravenous contrast. CONTRAST:  125m OMNIPAQUE IOHEXOL 300 MG/ML  SOLN COMPARISON:  CT 09/22/2018 FINDINGS: Lower chest: Lung bases demonstrate no acute consolidation or effusion. Mild fibrosis  within the medial right lung base. Cardiomegaly with biatrial enlargement. Mitral valve prosthesis. Hepatobiliary: Status post cholecystectomy. No biliary dilatation. No focal hepatic abnormality Pancreas: Unremarkable. No pancreatic ductal dilatation or surrounding inflammatory changes. Spleen: Normal in size without focal abnormality. Adrenals/Urinary Tract: Adrenal glands are normal. 6.3 cm cyst in the upper pole left kidney. Probable cyst mid right kidney. The urinary bladder is unremarkable. Subcentimeter hypodense renal lesions too small to further characterize. Stomach/Bowel: Stomach is within normal limits. Appendix appears normal. No evidence of bowel wall thickening, distention, or inflammatory changes. Vascular/Lymphatic: Mild aortic atherosclerosis. No aneurysm. No suspicious nodes. Reproductive: Status post hysterectomy. No adnexal masses. Other: Negative for free air or free fluid. Small upper abdominal ventral hernia containing fat. Small amount of fluid in the hernia sac. Musculoskeletal: Stable sclerotic lesion within the right iliac bone measuring 18 mm, felt benign given lack of interval change. IMPRESSION: 1. No CT evidence for acute intra-abdominal or pelvic abnormality. 2. Cardiomegaly with biatrial enlargement. Aortic Atherosclerosis (ICD10-I70.0). Electronically Signed   By: KDonavan FoilM.D.   On: 02/05/2021 21:17   DG Chest Portable 1 View  Result Date: 02/05/2021 CLINICAL DATA:  Chest pain and tachycardia. EXAM: PORTABLE CHEST 1 VIEW COMPARISON:  November 19, 2020 FINDINGS: Multiple sternal wires are noted. The cardiac silhouette is markedly enlarged and unchanged in size. Both lungs are clear. The visualized skeletal structures are unremarkable. IMPRESSION: Stable cardiomegaly without acute or active cardiopulmonary disease. Electronically Signed   By: TVirgina NorfolkM.D.   On: 02/05/2021 19:37    EKG: Independently reviewed. Atrial fibrillation with RVR, rate 162.    Assessment/Plan   1. Atrial fibrillation with RVR  - Presents with palpitations and diarrhea and is found to be in atrial fibrillation with rate 162  - Rate controlled in ED after 1 liter NS and IV diltiazem  - CHADS-VASc at least 5 (age x2, gender, CHF, DM)  - Continue oral diltiazem and continue diltiazem infusion for now, continue warfarin    2. SIRS  - Presents with palpitations and diarrhea and is found to be febrile with tachycardia and tachypnea  - Likely viral gastroenteritis, lactate reassuringly normal and BP stable  - Follow cultures and clinical course, hold antibiotics for now    3. Diarrhea  - Presents with watery diarrhea that has improved  - No acute findings on CT abd/pelvis in ED  - C diff and GI pathogen panel ordered from ED but no diarrhea since arrival  - Continue supportive care    4. CKD IIIa  - SCr is 1.09 on admission, up from 0.92 a week ago  - Renally-dose medications, monitor    5. Type II DM  - A1c was 6.8% this month  - Check CBGs and use low-intensity SSI for now    6. Chronic diastolic CHF  - Appears compensated, likely hypovolemic in setting of diarrhea  - Given a liter of NS in ED - Hold diuretics initially, monitor volume status    DVT prophylaxis: warfarin  Code Status: Full  Level of Care: Level of care: Progressive Family Communication: None available  Disposition Plan:  Patient is from: home  Anticipated d/c is to: Home  Anticipated d/c date is: 02/07/21 Patient currently: Pending rate-control, improvement in sepsis parameters  Consults called: none  Admission status: Inpatient     Vianne Bulls, MD [facility]  02/05/2021, 11:52 PM

## 2021-02-05 NOTE — ED Triage Notes (Signed)
Emergency Medicine Provider Triage Evaluation Note  Beth Miller , a 80 y.o. female  was evaluated in triage.  Pt complains of multiple complaints.  Patient with chest pain, back pain.  Her back pain is worse with movement.  Located to her lower back and goes up to the top of her right back.  She has been having some palpitations.  Has known history of A. fib. Compliant with meds. She denies any recent injury or trauma. Denies dysuria, hematuria. On warfarin.   Review of Systems  Positive: Chest pain, back pain, fever, palpitations Negative: Paresthesias, weakness, syncope  Physical Exam  BP (!) 109/95 (BP Location: Right Arm)   Pulse (!) 55   Temp (!) 101.4 F (38.6 C) (Oral)   Resp (!) 24   LMP  (LMP Unknown)   SpO2 96%  Gen:   Awake, appear very uncomfortable HEENT:  Atraumatic  Resp:  Normal effort  Cardiac:  Tachycardic into the 160s, irregular rhythm  Abd:   Nondistended, non-tender, no midline pulsatile mass MSK:   Moves extremities without difficulty.  Diffuse tenderness to back, worse to LL back and RL back Neuro:  Speech clear.  Intact sensation  Medical Decision Making  Medically screening exam initiated at 6:17 PM.  Appropriate orders placed.  Beth Miller was informed that the remainder of the evaluation will be completed by another provider, this initial triage assessment does not replace that evaluation, and the importance of remaining in the ED until their evaluation is complete.   Patient here febrile up to 101, tachycardic into the 160s, chest pain, back pain.  EKG with A. fib and RVR  Nursing notified patient needs room in back  Clinical Impression  Fever, chest pain, back pain, palpitations   Beth Miller A, PA-C 02/05/21 1818

## 2021-02-05 NOTE — ED Notes (Signed)
Urine sent to lab WITH a culture.

## 2021-02-06 LAB — BLOOD CULTURE ID PANEL (REFLEXED) - BCID2

## 2021-02-06 LAB — CBC
HCT: 29.2 % — ABNORMAL LOW (ref 36.0–46.0)
Hemoglobin: 10 g/dL — ABNORMAL LOW (ref 12.0–15.0)
MCH: 27.2 pg (ref 26.0–34.0)
MCHC: 34.2 g/dL (ref 30.0–36.0)
MCV: 79.3 fL — ABNORMAL LOW (ref 80.0–100.0)
Platelets: 238 10*3/uL (ref 150–400)
RBC: 3.68 MIL/uL — ABNORMAL LOW (ref 3.87–5.11)
RDW: 15.7 % — ABNORMAL HIGH (ref 11.5–15.5)
WBC: 6.9 10*3/uL (ref 4.0–10.5)
nRBC: 0 % (ref 0.0–0.2)

## 2021-02-06 LAB — BASIC METABOLIC PANEL
Anion gap: 9 (ref 5–15)
BUN: 13 mg/dL (ref 8–23)
CO2: 27 mmol/L (ref 22–32)
Calcium: 9.5 mg/dL (ref 8.9–10.3)
Chloride: 103 mmol/L (ref 98–111)
Creatinine, Ser: 0.9 mg/dL (ref 0.44–1.00)
GFR, Estimated: >60 mL/min (ref >60)
Glucose, Bld: 113 mg/dL — ABNORMAL HIGH (ref 70–99)
Potassium: 3.8 mmol/L (ref 3.5–5.1)
Sodium: 139 mmol/L (ref 135–145)

## 2021-02-06 LAB — GLUCOSE, CAPILLARY
Glucose-Capillary: 145 mg/dL — ABNORMAL HIGH (ref 70–99)
Glucose-Capillary: 131 mg/dL — ABNORMAL HIGH (ref 70–99)
Glucose-Capillary: 165 mg/dL — ABNORMAL HIGH (ref 70–99)
Glucose-Capillary: 97 mg/dL (ref 70–99)

## 2021-02-06 LAB — PROTIME-INR
INR: 1.8 — ABNORMAL HIGH (ref 0.8–1.2)
Prothrombin Time: 20.5 seconds — ABNORMAL HIGH (ref 11.4–15.2)

## 2021-02-06 LAB — MAGNESIUM: Magnesium: 1.7 mg/dL (ref 1.7–2.4)

## 2021-02-06 MED ORDER — ACETAMINOPHEN 500 MG PO TABS
1000.0000 mg | ORAL_TABLET | Freq: Three times a day (TID) | ORAL | Status: DC
  Administered 2021-02-06 – 2021-02-08 (×5): 1000 mg via ORAL
  Filled 2021-02-06 (×6): qty 2

## 2021-02-06 MED ORDER — DILTIAZEM HCL ER COATED BEADS 120 MG PO CP24
240.0000 mg | ORAL_CAPSULE | Freq: Every day | ORAL | Status: DC
  Administered 2021-02-07 – 2021-02-12 (×6): 240 mg via ORAL
  Filled 2021-02-06 (×6): qty 2

## 2021-02-06 MED ORDER — PANTOPRAZOLE SODIUM 40 MG PO TBEC
40.0000 mg | DELAYED_RELEASE_TABLET | Freq: Every day | ORAL | Status: DC
  Administered 2021-02-06 – 2021-02-12 (×7): 40 mg via ORAL
  Filled 2021-02-06 (×7): qty 1

## 2021-02-06 MED ORDER — GABAPENTIN 100 MG PO CAPS
100.0000 mg | ORAL_CAPSULE | Freq: Every day | ORAL | Status: DC
  Administered 2021-02-06 – 2021-02-11 (×6): 100 mg via ORAL
  Filled 2021-02-06 (×6): qty 1

## 2021-02-06 MED ORDER — ENOXAPARIN SODIUM 80 MG/0.8ML ~~LOC~~ SOLN
80.0000 mg | Freq: Once | SUBCUTANEOUS | Status: AC
  Administered 2021-02-06: 80 mg via SUBCUTANEOUS
  Filled 2021-02-06: qty 0.8

## 2021-02-06 MED ORDER — CYCLOBENZAPRINE HCL 10 MG PO TABS
10.0000 mg | ORAL_TABLET | Freq: Three times a day (TID) | ORAL | Status: DC
  Administered 2021-02-06 – 2021-02-09 (×7): 10 mg via ORAL
  Filled 2021-02-06 (×9): qty 1

## 2021-02-06 MED ORDER — METHOCARBAMOL 500 MG PO TABS
500.0000 mg | ORAL_TABLET | Freq: Three times a day (TID) | ORAL | Status: DC | PRN
  Administered 2021-02-06: 500 mg via ORAL
  Filled 2021-02-06: qty 1

## 2021-02-06 MED ORDER — METHOCARBAMOL 500 MG PO TABS
500.0000 mg | ORAL_TABLET | Freq: Three times a day (TID) | ORAL | Status: DC
  Administered 2021-02-06: 500 mg via ORAL
  Filled 2021-02-06: qty 1

## 2021-02-06 MED ORDER — ENOXAPARIN SODIUM 80 MG/0.8ML ~~LOC~~ SOLN
80.0000 mg | Freq: Two times a day (BID) | SUBCUTANEOUS | Status: DC
  Administered 2021-02-07 – 2021-02-12 (×11): 80 mg via SUBCUTANEOUS
  Filled 2021-02-06 (×11): qty 0.8

## 2021-02-06 MED ORDER — WARFARIN - PHARMACIST DOSING INPATIENT: Freq: Every day | Status: DC

## 2021-02-06 MED ORDER — LIDOCAINE 5 % EX PTCH
1.0000 | MEDICATED_PATCH | CUTANEOUS | Status: DC
  Administered 2021-02-06 – 2021-02-07 (×2): 1 via TRANSDERMAL
  Filled 2021-02-06 (×2): qty 1

## 2021-02-06 MED ORDER — WARFARIN SODIUM 7.5 MG PO TABS
15.0000 mg | ORAL_TABLET | Freq: Once | ORAL | Status: AC
  Administered 2021-02-06: 15 mg via ORAL
  Filled 2021-02-06: qty 2

## 2021-02-06 MED ORDER — MEMANTINE HCL 10 MG PO TABS
5.0000 mg | ORAL_TABLET | Freq: Two times a day (BID) | ORAL | Status: DC
  Administered 2021-02-06 – 2021-02-12 (×13): 5 mg via ORAL
  Filled 2021-02-06 (×13): qty 1

## 2021-02-06 MED ORDER — MIRABEGRON ER 25 MG PO TB24
25.0000 mg | ORAL_TABLET | Freq: Every day | ORAL | Status: DC
  Administered 2021-02-06 – 2021-02-12 (×7): 25 mg via ORAL
  Filled 2021-02-06 (×7): qty 1

## 2021-02-06 NOTE — Progress Notes (Signed)
ANTICOAGULATION CONSULT NOTE  Pharmacy Consult:  Lovenox Indication: Afib/MVR  Allergies  Allergen Reactions  . Aricept [Donepezil Hcl]     Intolerance - bad dreams  . Food     Bananas and Fish, cause sinus drainage  . Furosemide     cramps  . Quinine Other (See Comments)    Can't hear  . Spironolactone     constipation  . Tramadol     headache  . Sulfadiazine Itching and Rash    Patient Measurements: Height: 5\' 2"  (157.5 cm) Weight: 81.1 kg (178 lb 12.7 oz) IBW/kg (Calculated) : 50.1  Vital Signs: Temp: 98.4 F (36.9 C) (04/12 0721) Temp Source: Oral (04/12 0721) BP: 112/78 (04/12 0721) Pulse Rate: 99 (04/12 0721)  Labs: Recent Labs    02/05/21 1814 02/05/21 1911 02/05/21 2014 02/06/21 0705  HGB 10.7* 11.2*  --  10.0*  HCT 31.4* 33.0*  --  29.2*  PLT 261  --   --  238  LABPROT 21.8*  --   --  20.5*  INR 2.0*  --   --  1.8*  CREATININE 1.09* 1.10*  --  0.90  TROPONINIHS 13  --  18*  --     Estimated Creatinine Clearance: 48.4 mL/min (by C-G formula based on SCr of 0.9 mg/dL).   Assessment: 80 y/o F presents to the ED with palpitations, low back pain, and diarrhea. On warfarin PTA for Afib and mechanical MVR. INR is below goal at 1.8 (goal 2.5-3.5), so Pharmacy consulted to start Lovenox bridge.  Renal function stable; no bleeding reported.  Goal of Therapy:  Anti-Xa level 0.6-1 units/ml 4hrs after LMWH dose given INR 2.5-3.5 per outpatient anti-coag notes Monitor platelets by anticoagulation protocol: Yes   Plan:  Warfarin 15 mg PO x 1 as ordered this AM Lovenox 80mg  SQ Q12H Daily CBC ends on 4/14  Doylene Splinter D. Mina Marble, PharmD, BCPS, [place] 02/06/2021, 4:28 PM

## 2021-02-06 NOTE — Progress Notes (Signed)
PHARMACY - PHYSICIAN COMMUNICATION CRITICAL VALUE ALERT - BLOOD CULTURE IDENTIFICATION (BCID)  Beth Miller is an 80 y.o. female who presented to [facility]21 with a chief complaint of palpitations, low back pain and diarrhea.  Found to be in Afib.  Assessment:  Thought to have viral gastroenteritis.  Fever resolved, WBC WNL, lactate unimpressive.  BCID reveals Staph spp in 2 of 4 bottles; the 2 positive bottles are from the same set.  Name of physician (or Provider) Contacted: Dr. Posey Pronto  Current antibiotics: none  Changes to prescribed antibiotics recommended:  Recommendations accepted by provider - hold off on abx for now, repeat blood culture and monitor clinical course.  Results for orders placed or performed during the hospital encounter of 02/05/21  Blood Culture ID Panel (Reflexed) (Collected: 02/05/2021  6:15 PM)  Result Value Ref Range   Enterococcus faecalis NOT DETECTED NOT DETECTED   Enterococcus Faecium NOT DETECTED NOT DETECTED   Listeria monocytogenes NOT DETECTED NOT DETECTED   Staphylococcus species DETECTED (A) NOT DETECTED   Staphylococcus aureus (BCID) NOT DETECTED NOT DETECTED   Staphylococcus epidermidis NOT DETECTED NOT DETECTED   Staphylococcus lugdunensis NOT DETECTED NOT DETECTED   Streptococcus species NOT DETECTED NOT DETECTED   Streptococcus agalactiae NOT DETECTED NOT DETECTED   Streptococcus pneumoniae NOT DETECTED NOT DETECTED   Streptococcus pyogenes NOT DETECTED NOT DETECTED   A.calcoaceticus-baumannii NOT DETECTED NOT DETECTED   Bacteroides fragilis NOT DETECTED NOT DETECTED   Enterobacterales NOT DETECTED NOT DETECTED   Enterobacter cloacae complex NOT DETECTED NOT DETECTED   Escherichia coli NOT DETECTED NOT DETECTED   Klebsiella aerogenes NOT DETECTED NOT DETECTED   Klebsiella oxytoca NOT DETECTED NOT DETECTED   Klebsiella pneumoniae NOT DETECTED NOT DETECTED   Proteus species NOT DETECTED NOT DETECTED   Salmonella species NOT  DETECTED NOT DETECTED   Serratia marcescens NOT DETECTED NOT DETECTED   Haemophilus influenzae NOT DETECTED NOT DETECTED   Neisseria meningitidis NOT DETECTED NOT DETECTED   Pseudomonas aeruginosa NOT DETECTED NOT DETECTED   Stenotrophomonas maltophilia NOT DETECTED NOT DETECTED   Candida albicans NOT DETECTED NOT DETECTED   Candida auris NOT DETECTED NOT DETECTED   Candida glabrata NOT DETECTED NOT DETECTED   Candida krusei NOT DETECTED NOT DETECTED   Candida parapsilosis NOT DETECTED NOT DETECTED   Candida tropicalis NOT DETECTED NOT DETECTED   Cryptococcus neoformans/gattii NOT DETECTED NOT DETECTED    Michalina Calbert D. Mina Marble, PharmD, BCPS, [place] 02/06/2021, 5:43 PM

## 2021-02-06 NOTE — Progress Notes (Signed)
ANTICOAGULATION CONSULT NOTE - Initial Consult  Pharmacy Consult for Warfarin Indication: Afib/MVR  Allergies  Allergen Reactions  . Aricept [Donepezil Hcl]     Intolerance - bad dreams  . Food     Bananas and Fish, cause sinus drainage  . Furosemide     cramps  . Quinine Other (See Comments)    Can't hear  . Spironolactone     constipation  . Tramadol     headache  . Sulfadiazine Itching and Rash    Patient Measurements: Height: 5\' 2"  (157.5 cm) Weight: 81.1 kg (178 lb 12.7 oz) IBW/kg (Calculated) : 50.1  Vital Signs: Temp: 98.7 F (37.1 C) (04/12 0003) Temp Source: Oral (04/12 0003) BP: 125/70 (04/12 0003) Pulse Rate: 100 (04/12 0003)  Labs: Recent Labs    02/05/21 1814 02/05/21 1911 02/05/21 2014  HGB 10.7* 11.2*  --   HCT 31.4* 33.0*  --   PLT 261  --   --   LABPROT 21.8*  --   --   INR 2.0*  --   --   CREATININE 1.09* 1.10*  --   TROPONINIHS 13  --  18*    Estimated Creatinine Clearance: 39.6 mL/min (A) (by C-G formula based on SCr of 1.1 mg/dL (H)).   Medical History: Past Medical History:  Diagnosis Date  . AF (atrial fibrillation) ([place])   . ALLERGIC RHINITIS   . Blood transfusion without reported diagnosis   . Cataract, senile   . Chest pain   . CHF (congestive heart failure) ([place])   . Chronic anticoagulation   . Diabetes mellitus   . Diabetes mellitus   . Disorder of oral soft tissue    of mouth  . Diverticulosis   . GERD (gastroesophageal reflux disease)   . Hematoma   . Hypertension   . Leg pain   . Low back pain   . Paresthesia   . Rheumatic fever   . Status post mitral valve replacement    St. Jude valve  . Stroke ([place])    tia  . Sweating   . Vitamin D deficiency      Assessment: 80 y/o F presents to the ED with palpitations, low back pain, and diarrhea. On warfarin PTA for Afib and MVR. INR is below goal at 2 (goal 2.5-3.5). Hgb 11.2. Renal function ok.   Outpatient warfarin dosing per recent anti-coag notes: 15 mg on  Mon/Fri and 10 mg all other days  Goal of Therapy:  INR 2.5-3.5 per outpatient anti-coag notes Monitor platelets by anticoagulation protocol: Yes   Plan:  Warfarin 15 mg PO x 1 now Daily PT/INR  Monitor for bleeding  Narda Bonds, PharmD, BCPS Clinical Pharmacist Phone: [phone]

## 2021-02-06 NOTE — Progress Notes (Signed)
[facility] Progress Note  Patient: Beth Miller    PZW:[id number]  DOA: 02/05/2021     Date of Service: the patient was seen and examined on 02/06/2021  Brief hospital course: Past medical history of mechanical mitral valve placement on Coumadin, chronic diastolic CHF, type II DM, HTN.  Presents with complaints of abdominal pain, diarrhea and fatigue.  Had a fever on admission and hypotension.  Diarrhea currently resolved.  Continues to have left leg pain. Also had A. fib with RVR requiring IV Cardizem. Currently plan is pain control and rate control.  Assessment and Plan: 1.  Paroxysmal A. fib with RVR On Cardizem at home. Presents with diarrhea. Started on IV Cardizem drip. Patient was given 180 mg of Cardizem on 4/12.  Patient continues to have heart rate elevated although sinus in nature. We will change to to 40 mg Cardizem starting 4/13. Discontinue IV Cardizem drip. On Coumadin INR subtherapeutic.  2.  SIRS, diarrhea With fever, tachycardia, tachypnea, hypotension met SIRS criteria on admission. Concern for viral gastroenteritis. Currently diarrhea resolved.  GI pathogen panel and C. difficile PCR has been ordered.  Tolerating oral diet. No further fever. Will monitor.  3.  Positive blood cultures with staph species 2 out of 4 blood culture positive with staph species. Unclear of the significance but most likely contaminant since patient is clinically improving without antibiotics. Continue to hold antibiotics.  Repeat cultures.  4.  Left flank pain. Musculoskeletal in nature. We will treat with muscle relaxant, as needed narcotics as well as lidocaine patch. PT referral.  5.  Mechanical mitral valve INR subtherapeutic on 4/12. INR target 2.5-3.5. Bridged with Lovenox.  6.  CKD 3a Renal function stable. Continue monitor.  7.  Type II Diabetes mellitus, controlled, with HLD without long-term insulin use with neuropathy On sliding scale insulin. Continue  to monitor. Holding oral hypoglycemic agents. Continue gabapentin for neuropathy.  8.  Chronic diastolic CHF Appears well compensated. Currently Lasix on hold.  9.  Obesity Placing the patient at high risk of poor outcome. Monitor. Body mass index is 32.7 kg/m.    Interventions:        Diet: Cardiac diet DVT Prophylaxis: Lovenox and Coumadin     Advance goals of care discussion: Full code  Family Communication: no family was present at bedside, at the time of interview.   Disposition:  Status is: Inpatient  Remains inpatient appropriate because:Inpatient level of care appropriate due to severity of illness   Dispo: The patient is from: Home              Anticipated d/c is to: Home              Patient currently is not medically stable to d/c.   Difficult to place patient         Subjective: Continues to have left flank pain.  No nausea no vomiting.  Tolerating oral diet.  No shortness of breath.  No diarrhea.  Physical Exam:  General: Appear in mild distress, no Rash; Oral Mucosa Clear, moist. no Abnormal Neck Mass Or lumps, Conjunctiva normal  Cardiovascular: S1 and S2 Present, no Murmur, Respiratory: good respiratory effort, Bilateral Air entry present and CTA, no Crackles, no wheezes Abdomen: Bowel Sound present, Soft and no tenderness Extremities: no Pedal edema Neurology: alert and oriented to time, place, and person affect appropriate. no new focal deficit Gait not checked due to patient safety concerns  Vitals:   02/05/21 2330 02/06/21 0003 02/06/21 0435 02/06/21  0721  BP: 110/68 125/70 113/67 112/78  Pulse: 99 100 99 99  Resp: (!) 24 20 20 20   Temp: 98.9 F (37.2 C) 98.7 F (37.1 C) 98.2 F (36.8 C) 98.4 F (36.9 C)  TempSrc: Oral Oral Oral Oral  SpO2: 95% 99% 94% 94%  Weight:  81.1 kg    Height:  5' 2"  (1.575 m)      Intake/Output Summary (Last 24 hours) at 02/06/2021 1854 Last data filed at 02/06/2021 1500 Gross per 24 hour  Intake  1345.51 ml  Output 750 ml  Net 595.51 ml   Filed Weights   02/06/21 0003  Weight: 81.1 kg    Data Reviewed: I have personally reviewed and interpreted daily labs, tele strips, imaging. I reviewed all nursing notes, pharmacy notes, vitals, pertinent old records I have discussed plan of care as described above with RN and patient/family.  CBC: Recent Labs  Lab 02/05/21 1814 02/05/21 1911 02/06/21 0705  WBC 8.5  --  6.9  NEUTROABS 6.4  --   --   HGB 10.7* 11.2* 10.0*  HCT 31.4* 33.0* 29.2*  MCV 80.1  --  79.3*  PLT 261  --  003   Basic Metabolic Panel: Recent Labs  Lab 02/05/21 1814 02/05/21 1911 02/06/21 0705  NA 135 136 139  K 4.1 4.2 3.8  CL 102 102 103  CO2 27  --  27  GLUCOSE 152* 153* 113*  BUN 16 17 13   CREATININE 1.09* 1.10* 0.90  CALCIUM 9.7  --  9.5  MG  --   --  1.7    Studies: CT Abdomen Pelvis W Contrast  Result Date: 02/05/2021 CLINICAL DATA:  Left-sided back and abdominal pain EXAM: CT ABDOMEN AND PELVIS WITH CONTRAST TECHNIQUE: Multidetector CT imaging of the abdomen and pelvis was performed using the standard protocol following bolus administration of intravenous contrast. CONTRAST:  148m OMNIPAQUE IOHEXOL 300 MG/ML  SOLN COMPARISON:  CT 09/22/2018 FINDINGS: Lower chest: Lung bases demonstrate no acute consolidation or effusion. Mild fibrosis within the medial right lung base. Cardiomegaly with biatrial enlargement. Mitral valve prosthesis. Hepatobiliary: Status post cholecystectomy. No biliary dilatation. No focal hepatic abnormality Pancreas: Unremarkable. No pancreatic ductal dilatation or surrounding inflammatory changes. Spleen: Normal in size without focal abnormality. Adrenals/Urinary Tract: Adrenal glands are normal. 6.3 cm cyst in the upper pole left kidney. Probable cyst mid right kidney. The urinary bladder is unremarkable. Subcentimeter hypodense renal lesions too small to further characterize. Stomach/Bowel: Stomach is within normal limits.  Appendix appears normal. No evidence of bowel wall thickening, distention, or inflammatory changes. Vascular/Lymphatic: Mild aortic atherosclerosis. No aneurysm. No suspicious nodes. Reproductive: Status post hysterectomy. No adnexal masses. Other: Negative for free air or free fluid. Small upper abdominal ventral hernia containing fat. Small amount of fluid in the hernia sac. Musculoskeletal: Stable sclerotic lesion within the right iliac bone measuring 18 mm, felt benign given lack of interval change. IMPRESSION: 1. No CT evidence for acute intra-abdominal or pelvic abnormality. 2. Cardiomegaly with biatrial enlargement. Aortic Atherosclerosis (ICD10-I70.0). Electronically Signed   By: KDonavan FoilM.D.   On: 02/05/2021 21:17   DG Chest Portable 1 View  Result Date: 02/05/2021 CLINICAL DATA:  Chest pain and tachycardia. EXAM: PORTABLE CHEST 1 VIEW COMPARISON:  November 19, 2020 FINDINGS: Multiple sternal wires are noted. The cardiac silhouette is markedly enlarged and unchanged in size. Both lungs are clear. The visualized skeletal structures are unremarkable. IMPRESSION: Stable cardiomegaly without acute or active cardiopulmonary disease. Electronically Signed  By: Virgina Norfolk M.D.   On: 02/05/2021 19:37    Scheduled Meds: . acetaminophen  1,000 mg Oral TID  . cyclobenzaprine  10 mg Oral TID  . [START ON 02/07/2021] diltiazem  240 mg Oral Daily  . [START ON 02/07/2021] enoxaparin (LOVENOX) injection  80 mg Subcutaneous Q12H  . gabapentin  100 mg Oral QHS  . insulin aspart  0-9 Units Subcutaneous TID WC  . lidocaine  1 patch Transdermal Q24H  . memantine  5 mg Oral BID  . mirabegron ER  25 mg Oral Daily  . pantoprazole  40 mg Oral Daily  . Warfarin - Pharmacist Dosing Inpatient   Does not apply q1600   Continuous Infusions: PRN Meds: acetaminophen **OR** acetaminophen, HYDROcodone-acetaminophen, ondansetron **OR** ondansetron (ZOFRAN) IV  Time spent: 35 minutes  Author: Berle Mull,  MD [facility] 02/06/2021 6:54 PM  To reach On-call, see care teams to locate the attending and reach out via [email or web address]. Between 7PM-7AM, please contact night-coverage If you still have difficulty reaching the attending provider, please page the [facility] (Director on Call) for [facility] on amion for assistance.

## 2021-02-06 NOTE — Progress Notes (Signed)
[place] for Warfarin Indication: Afib/MVR  Allergies  Allergen Reactions  . Aricept [Donepezil Hcl]     Intolerance - bad dreams  . Food     Bananas and Fish, cause sinus drainage  . Furosemide     cramps  . Quinine Other (See Comments)    Can't hear  . Spironolactone     constipation  . Tramadol     headache  . Sulfadiazine Itching and Rash    Patient Measurements: Height: 5\' 2"  (157.5 cm) Weight: 81.1 kg (178 lb 12.7 oz) IBW/kg (Calculated) : 50.1  Vital Signs: Temp: 98.4 F (36.9 C) (04/12 0721) Temp Source: Oral (04/12 0721) BP: 112/78 (04/12 0721) Pulse Rate: 99 (04/12 0721)  Labs: Recent Labs    02/05/21 1814 02/05/21 1911 02/05/21 2014 02/06/21 0705  HGB 10.7* 11.2*  --  10.0*  HCT 31.4* 33.0*  --  29.2*  PLT 261  --   --  238  LABPROT 21.8*  --   --  20.5*  INR 2.0*  --   --  1.8*  CREATININE 1.09* 1.10*  --  0.90  TROPONINIHS 13  --  18*  --     Estimated Creatinine Clearance: 48.4 mL/min (by C-G formula based on SCr of 0.9 mg/dL).   Medical History: Past Medical History:  Diagnosis Date  . AF (atrial fibrillation) ([place])   . ALLERGIC RHINITIS   . Blood transfusion without reported diagnosis   . Cataract, senile   . Chest pain   . CHF (congestive heart failure) ([place])   . Chronic anticoagulation   . Diabetes mellitus   . Diabetes mellitus   . Disorder of oral soft tissue    of mouth  . Diverticulosis   . GERD (gastroesophageal reflux disease)   . Hematoma   . Hypertension   . Leg pain   . Low back pain   . Paresthesia   . Rheumatic fever   . Status post mitral valve replacement    St. Jude valve  . Stroke ([place])    tia  . Sweating   . Vitamin D deficiency      Assessment: 80 y/o F presents to the ED with palpitations, low back pain, and diarrhea. On warfarin PTA for Afib and MVR. INR is below goal at 1.8 (goal 2.5-3.5). Last outpatient INR was 2.1 on 3/21  Outpatient warfarin dosing  per recent anti-coag notes: 15 mg on Mon/Fri and 10 mg all other days  Goal of Therapy:  INR 2.5-3.5 per outpatient anti-coag notes Monitor platelets by anticoagulation protocol: Yes   Plan:  Warfarin 15 mg PO x 1 Daily PT/INR  If INR drops further will need to consider a heparin bridge  Hildred Laser, PharmD Clinical Pharmacist **Pharmacist phone directory can now be found on [email or web address] ([email or web address]).  Listed under [place].

## 2021-02-06 NOTE — Progress Notes (Signed)
Mobility Specialist: Progress Note   02/06/21 1547  Mobility  Activity Ambulated in hall  Level of Assistance Standby assist, set-up cues, supervision of patient - no hands on  Assistive Device Front wheel walker  Distance Ambulated (ft) 470 ft  Mobility Response Tolerated fair  Mobility performed by Mobility specialist  $Mobility charge 1 Mobility   Pre-Mobility: 100 HR, 100% SpO2 Post-Mobility: 101 HR, 114/78 BP, 100% SpO2  Pt c/o "20/10" pain in her L lower back/glute during ambulation. Encouraged pt to do the best she could. Pt said her pain slightly eased up towards the end of the walk. Pt back to bed with call bell at her side.   [facility] Sajid Ruppert Mobility Specialist Mobility Specialist Phone: [phone]

## 2021-02-07 DIAGNOSIS — A419 Sepsis, unspecified organism: Secondary | ICD-10-CM

## 2021-02-07 LAB — BASIC METABOLIC PANEL
Anion gap: 6 (ref 5–15)
BUN: 10 mg/dL (ref 8–23)
CO2: 27 mmol/L (ref 22–32)
Calcium: 9.3 mg/dL (ref 8.9–10.3)
Chloride: 103 mmol/L (ref 98–111)
Creatinine, Ser: 0.94 mg/dL (ref 0.44–1.00)
GFR, Estimated: >60 mL/min (ref >60)
Glucose, Bld: 112 mg/dL — ABNORMAL HIGH (ref 70–99)
Potassium: 3.7 mmol/L (ref 3.5–5.1)
Sodium: 136 mmol/L (ref 135–145)

## 2021-02-07 LAB — CBC
HCT: 29.1 % — ABNORMAL LOW (ref 36.0–46.0)
Hemoglobin: 9.9 g/dL — ABNORMAL LOW (ref 12.0–15.0)
MCH: 27.1 pg (ref 26.0–34.0)
MCHC: 34 g/dL (ref 30.0–36.0)
MCV: 79.7 fL — ABNORMAL LOW (ref 80.0–100.0)
Platelets: 230 10*3/uL (ref 150–400)
RBC: 3.65 MIL/uL — ABNORMAL LOW (ref 3.87–5.11)
RDW: 15.6 % — ABNORMAL HIGH (ref 11.5–15.5)
WBC: 6.8 10*3/uL (ref 4.0–10.5)
nRBC: 0 % (ref 0.0–0.2)

## 2021-02-07 LAB — GLUCOSE, CAPILLARY
Glucose-Capillary: 120 mg/dL — ABNORMAL HIGH (ref 70–99)
Glucose-Capillary: 152 mg/dL — ABNORMAL HIGH (ref 70–99)
Glucose-Capillary: 82 mg/dL (ref 70–99)
Glucose-Capillary: 125 mg/dL — ABNORMAL HIGH (ref 70–99)

## 2021-02-07 LAB — PROTIME-INR
INR: 2.1 — ABNORMAL HIGH (ref 0.8–1.2)
Prothrombin Time: 23.3 seconds — ABNORMAL HIGH (ref 11.4–15.2)

## 2021-02-07 MED ORDER — WARFARIN SODIUM 10 MG PO TABS
10.0000 mg | ORAL_TABLET | Freq: Once | ORAL | Status: AC
  Administered 2021-02-07: 10 mg via ORAL
  Filled 2021-02-07: qty 1

## 2021-02-07 NOTE — Progress Notes (Signed)
[place] for Warfarin/lovenox Indication: Afib/MVR  Allergies  Allergen Reactions  . Aricept [Donepezil Hcl]     Intolerance - bad dreams  . Food     Bananas and Fish, cause sinus drainage  . Furosemide     cramps  . Quinine Other (See Comments)    Can't hear  . Spironolactone     constipation  . Tramadol     headache  . Sulfadiazine Itching and Rash    Patient Measurements: Height: 5\' 2"  (157.5 cm) Weight: 80.5 kg (177 lb 7.5 oz) IBW/kg (Calculated) : 50.1  Vital Signs: Temp: 98.2 F (36.8 C) (04/13 1102) Temp Source: Oral (04/13 1102) BP: 111/72 (04/13 1102) Pulse Rate: 102 (04/13 1102)  Labs: Recent Labs    02/05/21 1814 02/05/21 1911 02/05/21 2014 02/06/21 0705 02/07/21 0413  HGB 10.7* 11.2*  --  10.0* 9.9*  HCT 31.4* 33.0*  --  29.2* 29.1*  PLT 261  --   --  238 230  LABPROT 21.8*  --   --  20.5* 23.3*  INR 2.0*  --   --  1.8* 2.1*  CREATININE 1.09* 1.10*  --  0.90 0.94  TROPONINIHS 13  --  18*  --   --     Estimated Creatinine Clearance: 46.2 mL/min (by C-G formula based on SCr of 0.94 mg/dL).   Medical History: Past Medical History:  Diagnosis Date  . AF (atrial fibrillation) ([place])   . ALLERGIC RHINITIS   . Blood transfusion without reported diagnosis   . Cataract, senile   . Chest pain   . CHF (congestive heart failure) ([place])   . Chronic anticoagulation   . Diabetes mellitus   . Diabetes mellitus   . Disorder of oral soft tissue    of mouth  . Diverticulosis   . GERD (gastroesophageal reflux disease)   . Hematoma   . Hypertension   . Leg pain   . Low back pain   . Paresthesia   . Rheumatic fever   . Status post mitral valve replacement    St. Jude valve  . Stroke ([place])    tia  . Sweating   . Vitamin D deficiency      Assessment: 80 y/o F presents to the ED with palpitations, low back pain, and diarrhea. On warfarin PTA for Afib and MVR.  (goal 2.5-3.5). Last outpatient INR was 2.1 on  3/21. Lovenox added 4/12 -INR= 2.2 (trend up)  Outpatient warfarin dosing per recent anti-coag notes: 15 mg on Mon/Fri and 10 mg all other days  Goal of Therapy:  INR 2.5-3.5 per outpatient anti-coag notes Monitor platelets by anticoagulation protocol: Yes   Plan:  Warfarin 10 mg PO x 1 Daily PT/INR   Hildred Laser, PharmD Clinical Pharmacist **Pharmacist phone directory can now be found on [email or web address] ([email or web address]).  Listed under [place].

## 2021-02-07 NOTE — Progress Notes (Signed)
PROGRESS NOTE    Beth Miller   IRW:[id number]  DOB: [birth year]  DOA: 02/05/2021 PCP: Cassandria Anger, MD   Brief Narrative:  Beth Miller is an 79 year old female with mechanical mitral valve on Coumadin, chronic diastolic heart failure, type 2 diabetes mellitus, hypertension who presented to the hospital with diarrhea occurring on Sunday.  She said she had 4 episodes of watery diarrhea with abdominal pain and fatigue. ED: Temperature 101.4, tachycardic, blood pressure 109/95, respiratory rate in 20s.  Cardiac rhythm was atrial fibrillation.   She was started on IV Cardizem infusion after bolus IV fluids.  Subjective: Diarrhea and abdominal pain have resolved.  She is eating and food is not upsetting her.  Complains of severe pain in her left buttock area which she states occurred on Monday while she was sitting up in a chair and slightly twisted to the right.  The pain has not resolved.    Assessment & Plan:   Principal Problem:   Atrial fibrillation with RVR  -Off Cardizem infusion and on oral Cardizem now at a dose of 240 mg (dose increased today as she is still tachycardic) -Heart rate in the low 100s-continue to follow  Active Problems:  Abdominal pain, diarrhea with fever, tachycardia, tachypnea -sepsis -Has resolved-she states she did not eat anything out of the ordinary or take any laxatives -Question gastroenteritis  1 set of blood cultures positive for staph hominis -Repeat blood cultures are pending -Temperatures have been about 99 degrees -I have consulted ID  Left gluteal pain -On exam, she is in pain when I squeeze her upper outer gluteal area-I believe this is musculoskeletal-my colleague has started Flexeril 3 times daily and a lidocaine patch which can be continued - I have asked her to avoid activities that exacerbate the pain -Will order a K pad    DM2 (diabetes mellitus, type 2)  -Continue sliding scale insulin   Chemical mitral valve with  subtherapeutic INR -INR is 2.1 today-continue Lovenox and Coumadin   Time spent in minutes: 35 DVT prophylaxis: Lovenox Code Status: Full code Family Communication:  Level of Care: Level of care: Progressive Disposition Plan:  Status is: Inpatient  Remains inpatient appropriate because:Inpatient level of care appropriate due to severity of illness   Dispo: The patient is from: Home              Anticipated d/c is to: Home              Patient currently is not medically stable to d/c.   Difficult to place patient No      Consultants:   ID Procedures:   none Antimicrobials:  Anti-infectives (From admission, onward)   None       Objective: Vitals:   02/07/21 0728 02/07/21 1031 02/07/21 1102 02/07/21 1623  BP: 105/73 113/67 111/72 121/78  Pulse: (!) 101  (!) 102 (!) 102  Resp: 18  18 19   Temp: 98.5 F (36.9 C)  98.2 F (36.8 C) 99 F (37.2 C)  TempSrc: Oral  Oral Oral  SpO2: 95%  94% 92%  Weight:      Height:        Intake/Output Summary (Last 24 hours) at 02/07/2021 1721 Last data filed at 02/06/2021 2215 Gross per 24 hour  Intake 100 ml  Output 1200 ml  Net -1100 ml   Filed Weights   02/06/21 0003 02/07/21 0355  Weight: 81.1 kg 80.5 kg    Examination: General exam: Appears comfortable  HEENT:  PERRLA, oral mucosa moist, no sclera icterus or thrush Respiratory system: Clear to auscultation. Respiratory effort normal. Cardiovascular system: S1 & S2 heard, RRR.   Gastrointestinal system: Abdomen soft, non-tender, nondistended. Normal bowel sounds. Central nervous system: Alert and oriented. No focal neurological deficits. Extremities: No cyanosis, clubbing or edema- tenderness in left upper, outer gluteal area when squeezing the muscle Skin: No rashes or ulcers Psychiatry:  Mood & affect appropriate.     Data Reviewed: I have personally reviewed following labs and imaging studies  CBC: Recent Labs  Lab 02/05/21 1814 02/05/21 1911  02/06/21 0705 02/07/21 0413  WBC 8.5  --  6.9 6.8  NEUTROABS 6.4  --   --   --   HGB 10.7* 11.2* 10.0* 9.9*  HCT 31.4* 33.0* 29.2* 29.1*  MCV 80.1  --  79.3* 79.7*  PLT 261  --  238 937   Basic Metabolic Panel: Recent Labs  Lab 02/05/21 1814 02/05/21 1911 02/06/21 0705 02/07/21 0413  NA 135 136 139 136  K 4.1 4.2 3.8 3.7  CL 102 102 103 103  CO2 27  --  27 27  GLUCOSE 152* 153* 113* 112*  BUN 16 17 13 10   CREATININE 1.09* 1.10* 0.90 0.94  CALCIUM 9.7  --  9.5 9.3  MG  --   --  1.7  --    GFR: Estimated Creatinine Clearance: 46.2 mL/min (by C-G formula based on SCr of 0.94 mg/dL). Liver Function Tests: Recent Labs  Lab 02/05/21 1814  AST 21  ALT 13  ALKPHOS 61  BILITOT 1.0  PROT 6.9  ALBUMIN 3.7   Recent Labs  Lab 02/05/21 1814  LIPASE 47   No results for input(s): AMMONIA in the last 168 hours. Coagulation Profile: Recent Labs  Lab 02/05/21 1814 02/06/21 0705 02/07/21 0413  INR 2.0* 1.8* 2.1*   Cardiac Enzymes: No results for input(s): CKTOTAL, CKMB, CKMBINDEX, TROPONINI in the last 168 hours. BNP (last 3 results) No results for input(s): PROBNP in the last 8760 hours. HbA1C: No results for input(s): HGBA1C in the last 72 hours. CBG: Recent Labs  Lab 02/06/21 1637 02/06/21 2120 02/07/21 0624 02/07/21 1101 02/07/21 1625  GLUCAP 165* 97 120* 152* 82   Lipid Profile: No results for input(s): CHOL, HDL, LDLCALC, TRIG, CHOLHDL, LDLDIRECT in the last 72 hours. Thyroid Function Tests: No results for input(s): TSH, T4TOTAL, FREET4, T3FREE, THYROIDAB in the last 72 hours. Anemia Panel: No results for input(s): VITAMINB12, FOLATE, FERRITIN, TIBC, IRON, RETICCTPCT in the last 72 hours. Urine analysis:    Component Value Date/Time   COLORURINE YELLOW 02/05/2021 2257   APPEARANCEUR CLEAR 02/05/2021 2257   LABSPEC >1.046 (H) 02/05/2021 2257   PHURINE 6.0 02/05/2021 2257   GLUCOSEU NEGATIVE 02/05/2021 2257   GLUCOSEU NEGATIVE 04/27/2019 1041    HGBUR MODERATE (A) 02/05/2021 2257   BILIRUBINUR NEGATIVE 02/05/2021 2257   KETONESUR NEGATIVE 02/05/2021 2257   PROTEINUR NEGATIVE 02/05/2021 2257   UROBILINOGEN 0.2 04/27/2019 1041   NITRITE NEGATIVE 02/05/2021 2257   LEUKOCYTESUR NEGATIVE 02/05/2021 2257   Sepsis Labs: @LABRCNTIP (procalcitonin:4,lacticidven:4) ) Recent Results (from the past 240 hour(s))  Blood culture (routine x 2)     Status: Abnormal (Preliminary result)   Collection Time: 02/05/21  6:15 PM   Specimen: BLOOD  Result Value Ref Range Status   Specimen Description BLOOD SITE NOT SPECIFIED  Final   Special Requests   Final    BOTTLES DRAWN AEROBIC AND ANAEROBIC Blood Culture results may not be optimal due  to an inadequate volume of blood received in culture bottles   Culture  Setup Time   Final    GRAM POSITIVE COCCI IN BOTH AEROBIC AND ANAEROBIC BOTTLES CRITICAL RESULT CALLED TO, READ BACK BY AND VERIFIED WITH: PHARMD T. DANG [id number] FCP    Culture (A)  Final    STAPHYLOCOCCUS HOMINIS THE SIGNIFICANCE OF ISOLATING THIS ORGANISM FROM A SINGLE SET OF BLOOD CULTURES WHEN MULTIPLE SETS ARE DRAWN IS UNCERTAIN. PLEASE NOTIFY THE MICROBIOLOGY DEPARTMENT WITHIN ONE WEEK IF SPECIATION AND SENSITIVITIES ARE REQUIRED. Performed at [facility], [place] [address], [place], [place] [address]    Report Status PENDING  Incomplete  Blood Culture ID Panel (Reflexed)     Status: Abnormal   Collection Time: 02/05/21  6:15 PM  Result Value Ref Range Status   Enterococcus faecalis NOT DETECTED NOT DETECTED Final   Enterococcus Faecium NOT DETECTED NOT DETECTED Final   Listeria monocytogenes NOT DETECTED NOT DETECTED Final   Staphylococcus species DETECTED (A) NOT DETECTED Final    Comment: CRITICAL RESULT CALLED TO, READ BACK BY AND VERIFIED WITH: PHARMD T. DANG [id number] FCP    Staphylococcus aureus (BCID) NOT DETECTED NOT DETECTED Final   Staphylococcus epidermidis NOT DETECTED NOT DETECTED Final   Staphylococcus  lugdunensis NOT DETECTED NOT DETECTED Final   Streptococcus species NOT DETECTED NOT DETECTED Final   Streptococcus agalactiae NOT DETECTED NOT DETECTED Final   Streptococcus pneumoniae NOT DETECTED NOT DETECTED Final   Streptococcus pyogenes NOT DETECTED NOT DETECTED Final   A.calcoaceticus-baumannii NOT DETECTED NOT DETECTED Final   Bacteroides fragilis NOT DETECTED NOT DETECTED Final   Enterobacterales NOT DETECTED NOT DETECTED Final   Enterobacter cloacae complex NOT DETECTED NOT DETECTED Final   Escherichia coli NOT DETECTED NOT DETECTED Final   Klebsiella aerogenes NOT DETECTED NOT DETECTED Final   Klebsiella oxytoca NOT DETECTED NOT DETECTED Final   Klebsiella pneumoniae NOT DETECTED NOT DETECTED Final   Proteus species NOT DETECTED NOT DETECTED Final   Salmonella species NOT DETECTED NOT DETECTED Final   Serratia marcescens NOT DETECTED NOT DETECTED Final   Haemophilus influenzae NOT DETECTED NOT DETECTED Final   Neisseria meningitidis NOT DETECTED NOT DETECTED Final   Pseudomonas aeruginosa NOT DETECTED NOT DETECTED Final   Stenotrophomonas maltophilia NOT DETECTED NOT DETECTED Final   Candida albicans NOT DETECTED NOT DETECTED Final   Candida auris NOT DETECTED NOT DETECTED Final   Candida glabrata NOT DETECTED NOT DETECTED Final   Candida krusei NOT DETECTED NOT DETECTED Final   Candida parapsilosis NOT DETECTED NOT DETECTED Final   Candida tropicalis NOT DETECTED NOT DETECTED Final   Cryptococcus neoformans/gattii NOT DETECTED NOT DETECTED Final    Comment: Performed at [facility], [place] [address], [place], [place] [address]  Blood culture (routine x 2)     Status: None (Preliminary result)   Collection Time: 02/05/21  7:17 PM   Specimen: BLOOD  Result Value Ref Range Status   Specimen Description BLOOD SITE NOT SPECIFIED  Final   Special Requests   Final    BOTTLES DRAWN AEROBIC AND ANAEROBIC Blood Culture adequate volume   Culture   Final    NO GROWTH 2  DAYS Performed at [facility], [address], [place], [place] [address]    Report Status PENDING  Incomplete  Resp Panel by RT-PCR (Flu A&B, Covid) Nasopharyngeal Swab     Status: None   Collection Time: 02/05/21 10:30 PM   Specimen: Nasopharyngeal Swab;  Nasopharyngeal(NP) swabs in vial transport medium  Result Value Ref Range Status   SARS Coronavirus 2 by RT PCR NEGATIVE NEGATIVE Final    Comment: (NOTE) SARS-CoV-2 target nucleic acids are NOT DETECTED.  The SARS-CoV-2 RNA is generally detectable in upper respiratory specimens during the acute phase of infection. The lowest concentration of SARS-CoV-2 viral copies this assay can detect is 138 copies/mL. A negative result does not preclude SARS-Cov-2 infection and should not be used as the sole basis for treatment or other patient management decisions. A negative result may occur with  improper specimen collection/handling, submission of specimen other than nasopharyngeal swab, presence of viral mutation(s) within the areas targeted by this assay, and inadequate number of viral copies(<138 copies/mL). A negative result must be combined with clinical observations, patient history, and epidemiological information. The expected result is Negative.  Fact Sheet for Patients:  EntrepreneurPulse.com.au  Fact Sheet for Healthcare Providers:  IncredibleEmployment.be  This test is no t yet approved or cleared by the [place] FDA and  has been authorized for detection and/or diagnosis of SARS-CoV-2 by FDA under an Emergency Use Authorization (EUA). This EUA will remain  in effect (meaning this test can be used) for the duration of the COVID-19 declaration under Section 564(b)(1) of the Act, 21 U.S.C.section 360bbb-3(b)(1), unless the authorization is terminated  or revoked sooner.       Influenza A by PCR NEGATIVE NEGATIVE Final   Influenza B by PCR NEGATIVE NEGATIVE Final    Comment:  (NOTE) The Xpert Xpress SARS-CoV-2/FLU/RSV plus assay is intended as an aid in the diagnosis of influenza from Nasopharyngeal swab specimens and should not be used as a sole basis for treatment. Nasal washings and aspirates are unacceptable for Xpert Xpress SARS-CoV-2/FLU/RSV testing.  Fact Sheet for Patients: EntrepreneurPulse.com.au  Fact Sheet for Healthcare Providers: IncredibleEmployment.be  This test is not yet approved or cleared by the [place] FDA and has been authorized for detection and/or diagnosis of SARS-CoV-2 by FDA under an Emergency Use Authorization (EUA). This EUA will remain in effect (meaning this test can be used) for the duration of the COVID-19 declaration under Section 564(b)(1) of the Act, 21 U.S.C. section 360bbb-3(b)(1), unless the authorization is terminated or revoked.  Performed at [facility], [place] [address], [place], [place] [address]   Culture, blood (routine x 2)     Status: None (Preliminary result)   Collection Time: 02/06/21  6:50 PM   Specimen: BLOOD RIGHT WRIST  Result Value Ref Range Status   Specimen Description BLOOD RIGHT WRIST  Final   Special Requests AEROBIC BOTTLE ONLY Blood Culture adequate volume  Final   Culture   Final    NO GROWTH < 12 HOURS Performed at [facility], [place] [address] [place], [place] [address]    Report Status PENDING  Incomplete  Culture, blood (routine x 2)     Status: None (Preliminary result)   Collection Time: 02/06/21  7:06 PM   Specimen: BLOOD RIGHT WRIST  Result Value Ref Range Status   Specimen Description BLOOD RIGHT WRIST  Final   Special Requests AEROBIC BOTTLE ONLY Blood Culture adequate volume  Final   Culture   Final    NO GROWTH < 12 HOURS Performed at [facility], [place] [address] [place], [place] [address]    Report Status PENDING  Incomplete         Radiology Studies: CT Abdomen Pelvis W Contrast  Result Date:  02/05/2021 CLINICAL  DATA:  Left-sided back and abdominal pain EXAM: CT ABDOMEN AND PELVIS WITH CONTRAST TECHNIQUE: Multidetector CT imaging of the abdomen and pelvis was performed using the standard protocol following bolus administration of intravenous contrast. CONTRAST:  169mL OMNIPAQUE IOHEXOL 300 MG/ML  SOLN COMPARISON:  CT 09/22/2018 FINDINGS: Lower chest: Lung bases demonstrate no acute consolidation or effusion. Mild fibrosis within the medial right lung base. Cardiomegaly with biatrial enlargement. Mitral valve prosthesis. Hepatobiliary: Status post cholecystectomy. No biliary dilatation. No focal hepatic abnormality Pancreas: Unremarkable. No pancreatic ductal dilatation or surrounding inflammatory changes. Spleen: Normal in size without focal abnormality. Adrenals/Urinary Tract: Adrenal glands are normal. 6.3 cm cyst in the upper pole left kidney. Probable cyst mid right kidney. The urinary bladder is unremarkable. Subcentimeter hypodense renal lesions too small to further characterize. Stomach/Bowel: Stomach is within normal limits. Appendix appears normal. No evidence of bowel wall thickening, distention, or inflammatory changes. Vascular/Lymphatic: Mild aortic atherosclerosis. No aneurysm. No suspicious nodes. Reproductive: Status post hysterectomy. No adnexal masses. Other: Negative for free air or free fluid. Small upper abdominal ventral hernia containing fat. Small amount of fluid in the hernia sac. Musculoskeletal: Stable sclerotic lesion within the right iliac bone measuring 18 mm, felt benign given lack of interval change. IMPRESSION: 1. No CT evidence for acute intra-abdominal or pelvic abnormality. 2. Cardiomegaly with biatrial enlargement. Aortic Atherosclerosis (ICD10-I70.0). Electronically Signed   By: Donavan Foil M.D.   On: 02/05/2021 21:17   DG Chest Portable 1 View  Result Date: 02/05/2021 CLINICAL DATA:  Chest pain and tachycardia. EXAM: PORTABLE CHEST 1 VIEW COMPARISON:  November 19, 2020 FINDINGS: Multiple sternal wires are noted. The cardiac silhouette is markedly enlarged and unchanged in size. Both lungs are clear. The visualized skeletal structures are unremarkable. IMPRESSION: Stable cardiomegaly without acute or active cardiopulmonary disease. Electronically Signed   By: Virgina Norfolk M.D.   On: 02/05/2021 19:37      Scheduled Meds: . acetaminophen  1,000 mg Oral TID  . cyclobenzaprine  10 mg Oral TID  . diltiazem  240 mg Oral Daily  . enoxaparin (LOVENOX) injection  80 mg Subcutaneous Q12H  . gabapentin  100 mg Oral QHS  . insulin aspart  0-9 Units Subcutaneous TID WC  . lidocaine  1 patch Transdermal Q24H  . memantine  5 mg Oral BID  . mirabegron ER  25 mg Oral Daily  . pantoprazole  40 mg Oral Daily  . Warfarin - Pharmacist Dosing Inpatient   Does not apply q1600   Continuous Infusions:   LOS: 2 days      Debbe Odea, MD [facility] Pager: [email or web address] 02/07/2021, 5:21 PM

## 2021-02-07 NOTE — Progress Notes (Signed)
Mobility Specialist: Progress Note   02/07/21 1721  Mobility  Activity Ambulated in hall  Level of Assistance Contact guard assist, steadying assist  Assistive Device Front wheel walker  Distance Ambulated (ft) 120 ft  Mobility Response Tolerated poorly  Mobility performed by Mobility specialist  Bed Position Chair  $Mobility charge 1 Mobility   Post-Mobility: 106 HR  Pt distance limited due to c/o pain in her L lower back/glute pre-mobility and during. Pt otherwise asx. Pt to chair after walk with call bell at her side.   [facility] Beth Miller Mobility Specialist Mobility Specialist Phone: [phone]

## 2021-02-07 NOTE — Evaluation (Signed)
Physical Therapy Evaluation Patient Details Name: Beth Miller MRN: [id number] DOB: [birth year] Today's Date: 02/07/2021   History of Present Illness  pt is an 80 y/o female admitted to the ED on 4/11 for evaluation of palpitations, LBP, diarrhea and fatigue.  PMHx, AFib, CHF, DM, HTN, LBP, Rheumatic fever s/p MVR with St Jude valve, Stroke  Clinical Impression  Pt admitted with/for  With LBP with afib and RVR.  Pt with point tenderness and pain L over area of iliac crest.  She is needing minimal to min guard assist for overall mobility and gait.Marland Kitchen  Pt currently limited functionally due to the problems listed below.  (see problems list.)  Pt will benefit from PT to maximize function and safety to be able to get home safely with available assist..     Follow Up Recommendations Home health PT;Supervision/Assistance - 24 hour    Equipment Recommendations    None   Recommendations for Other Services       Precautions / Restrictions Precautions Precautions: Fall (low risk)      Mobility  Bed Mobility Overal bed mobility: Needs Assistance Bed Mobility: Rolling;Sidelying to Sit Rolling: Min assist Sidelying to sit: Min assist       General bed mobility comments: need minimal assist due to pain.    Transfers Overall transfer level: Needs assistance Equipment used: Rolling walker (2 wheeled) Transfers: Sit to/from Stand Sit to Stand: Min assist         General transfer comment: stability assist  Ambulation/Gait Ambulation/Gait assistance: Min guard Gait Distance (Feet): 200 Feet Assistive device: Rolling walker (2 wheeled) Gait Pattern/deviations: Step-through pattern Gait velocity: slower Gait velocity interpretation: 1.31 - 2.62 ft/sec, indicative of limited community ambulator General Gait Details: Once upright, pt generally steady, but with too much use of the RW with tight shoulder girdle and frequent need for cues to relax.  Stairs            Wheelchair  Mobility    Modified Rankin (Stroke Patients Only)       Balance Overall balance assessment: Needs assistance Sitting-balance support: No upper extremity supported;Feet supported Sitting balance-Leahy Scale: Fair     Standing balance support: No upper extremity supported Standing balance-Leahy Scale: Fair Standing balance comment: using RW to moderate back pain, but still allowing tightness/stress in shod girdle.                             Pertinent Vitals/Pain Pain Assessment: Faces Faces Pain Scale: Hurts whole lot Pain Location: L side over iliac crest. Pain Descriptors / Indicators: Discomfort;Penetrating;Sharp;Spasm Pain Intervention(s): Monitored during session;Heat applied    Home Living Family/patient expects to be discharged to:: Private residence Living Arrangements: Spouse/significant other Available Help at Discharge: Family;Available 24 hours/day Type of Home: House Home Access: Stairs to enter Entrance Stairs-Rails: Psychiatric nurse of Steps: 3 Home Layout: One level Home Equipment: Cane - single point      Prior Function Level of Independence: Independent               Hand Dominance        Extremity/Trunk Assessment   Upper Extremity Assessment Upper Extremity Assessment: Overall WFL for tasks assessed    Lower Extremity Assessment Lower Extremity Assessment: Generalized weakness       Communication   Communication: No difficulties  Cognition Arousal/Alertness: Awake/alert Behavior During Therapy: Flat affect;WFL for tasks assessed/performed Overall Cognitive Status: Within Functional Limits for tasks assessed  General Comments      Exercises Other Exercises Other Exercises: moderately deep massage and pressure to L muscle spasm.  Addition of heat at end of session.   Assessment/Plan    PT Assessment Patient needs continued PT services  PT  Problem List Decreased strength;Decreased activity tolerance;Decreased knowledge of precautions;Cardiopulmonary status limiting activity;Decreased mobility;Pain       PT Treatment Interventions DME instruction;Gait training;Functional mobility training;Therapeutic activities;Patient/family education    PT Goals (Current goals can be found in the Care Plan section)  Acute Rehab PT Goals Patient Stated Goal: correct this painful spot, back to work. PT Goal Formulation: With patient Time For Goal Achievement: 02/21/21 Potential to Achieve Goals: Good    Frequency Min 3X/week   Barriers to discharge        Co-evaluation               AM-PAC PT "6 Clicks" Mobility  Outcome Measure Help needed turning from your back to your side while in a flat bed without using bedrails?: A Little Help needed moving from lying on your back to sitting on the side of a flat bed without using bedrails?: A Little Help needed moving to and from a bed to a chair (including a wheelchair)?: A Little Help needed standing up from a chair using your arms (e.g., wheelchair or bedside chair)?: A Little Help needed to walk in hospital room?: A Little Help needed climbing 3-5 steps with a railing? : A Little 6 Click Score: 18    End of Session   Activity Tolerance: Patient tolerated treatment well;Patient limited by pain Patient left: in chair;with call bell/phone within reach Nurse Communication: Mobility status PT Visit Diagnosis: Other abnormalities of gait and mobility (R26.89);Difficulty in walking, not elsewhere classified (R26.2)    Time: 1638-4536 PT Time Calculation (min) (ACUTE ONLY): 26 min   Charges:   PT Evaluation $PT Eval Moderate Complexity: 1 Mod PT Treatments $Gait Training: 8-22 mins        02/07/2021  Ginger Carne., PT Acute Rehabilitation Services [phone]  (pager) [phone]  (office)  Tessie Fass Shaiann Mcmanamon 02/07/2021, 4:50 PM

## 2021-02-08 DIAGNOSIS — R197 Diarrhea, unspecified: Secondary | ICD-10-CM

## 2021-02-08 DIAGNOSIS — I5032 Chronic diastolic (congestive) heart failure: Secondary | ICD-10-CM | POA: Diagnosis not present

## 2021-02-08 DIAGNOSIS — R6510 Systemic inflammatory response syndrome (SIRS) of non-infectious origin without acute organ dysfunction: Secondary | ICD-10-CM

## 2021-02-08 DIAGNOSIS — N1831 Chronic kidney disease, stage 3a: Secondary | ICD-10-CM | POA: Diagnosis not present

## 2021-02-08 DIAGNOSIS — R7881 Bacteremia: Secondary | ICD-10-CM

## 2021-02-08 DIAGNOSIS — E1122 Type 2 diabetes mellitus with diabetic chronic kidney disease: Secondary | ICD-10-CM

## 2021-02-08 DIAGNOSIS — I4891 Unspecified atrial fibrillation: Secondary | ICD-10-CM

## 2021-02-08 DIAGNOSIS — R509 Fever, unspecified: Secondary | ICD-10-CM

## 2021-02-08 LAB — GLUCOSE, CAPILLARY
Glucose-Capillary: 134 mg/dL — ABNORMAL HIGH (ref 70–99)
Glucose-Capillary: 175 mg/dL — ABNORMAL HIGH (ref 70–99)
Glucose-Capillary: 116 mg/dL — ABNORMAL HIGH (ref 70–99)
Glucose-Capillary: 117 mg/dL — ABNORMAL HIGH (ref 70–99)

## 2021-02-08 LAB — PROTIME-INR
INR: 2.4 — ABNORMAL HIGH (ref 0.8–1.2)
Prothrombin Time: 26.4 seconds — ABNORMAL HIGH (ref 11.4–15.2)

## 2021-02-08 LAB — CBC
HCT: 28.3 % — ABNORMAL LOW (ref 36.0–46.0)
Hemoglobin: 9.6 g/dL — ABNORMAL LOW (ref 12.0–15.0)
MCH: 26.7 pg (ref 26.0–34.0)
MCHC: 33.9 g/dL (ref 30.0–36.0)
MCV: 78.8 fL — ABNORMAL LOW (ref 80.0–100.0)
Platelets: 240 10*3/uL (ref 150–400)
RBC: 3.59 MIL/uL — ABNORMAL LOW (ref 3.87–5.11)
RDW: 15.4 % (ref 11.5–15.5)
WBC: 7.3 10*3/uL (ref 4.0–10.5)
nRBC: 0 % (ref 0.0–0.2)

## 2021-02-08 LAB — HEPATITIS PANEL, ACUTE
HCV Ab: NONREACTIVE
Hep A IgM: NONREACTIVE
Hep B C IgM: NONREACTIVE
Hepatitis B Surface Ag: NONREACTIVE

## 2021-02-08 LAB — BASIC METABOLIC PANEL
Anion gap: 6 (ref 5–15)
BUN: 13 mg/dL (ref 8–23)
CO2: 28 mmol/L (ref 22–32)
Calcium: 9.3 mg/dL (ref 8.9–10.3)
Chloride: 102 mmol/L (ref 98–111)
Creatinine, Ser: 0.96 mg/dL (ref 0.44–1.00)
GFR, Estimated: 59 mL/min — ABNORMAL LOW (ref >60)
Glucose, Bld: 155 mg/dL — ABNORMAL HIGH (ref 70–99)
Potassium: 4 mmol/L (ref 3.5–5.1)
Sodium: 136 mmol/L (ref 135–145)

## 2021-02-08 LAB — CULTURE, BLOOD (ROUTINE X 2)

## 2021-02-08 LAB — HIV ANTIBODY (ROUTINE TESTING W REFLEX): HIV Screen 4th Generation wRfx: NONREACTIVE

## 2021-02-08 LAB — SEDIMENTATION RATE: Sed Rate: 54 mm/hr — ABNORMAL HIGH (ref 0–22)

## 2021-02-08 LAB — C-REACTIVE PROTEIN: CRP: 7 mg/dL — ABNORMAL HIGH (ref <1.0)

## 2021-02-08 MED ORDER — WARFARIN SODIUM 10 MG PO TABS
10.0000 mg | ORAL_TABLET | Freq: Once | ORAL | Status: AC
  Administered 2021-02-08: 10 mg via ORAL
  Filled 2021-02-08: qty 1

## 2021-02-08 MED ORDER — ACETAMINOPHEN 325 MG PO TABS
650.0000 mg | ORAL_TABLET | Freq: Four times a day (QID) | ORAL | Status: DC | PRN
  Administered 2021-02-09: 650 mg via ORAL
  Filled 2021-02-08: qty 2

## 2021-02-08 MED ORDER — METOPROLOL TARTRATE 25 MG PO TABS
25.0000 mg | ORAL_TABLET | Freq: Two times a day (BID) | ORAL | Status: DC
  Administered 2021-02-08 – 2021-02-11 (×7): 25 mg via ORAL
  Filled 2021-02-08 (×8): qty 1

## 2021-02-08 MED ORDER — OXYCODONE HCL 5 MG PO TABS
5.0000 mg | ORAL_TABLET | Freq: Four times a day (QID) | ORAL | Status: DC | PRN
  Administered 2021-02-08 – 2021-02-10 (×3): 5 mg via ORAL
  Filled 2021-02-08 (×3): qty 1

## 2021-02-08 NOTE — Consult Note (Signed)
[facility] for Infectious Disease    Date of Admission:  02/05/2021     Total days of antibiotics                Reason for Consult: Bacteremia   Referring Provider: Wynelle Cleveland Primary Care Provider: Cassandria Anger, MD   ASSESSMENT:  Ms. Willetts is an 80 y/o AA female admitted with diarrhea, heart palpitations, and fatigue and subsequently found to have Staphylococcus hominis in 1 set of blood cultures. This likely represents a contaminant as the other set of cultures have been without growth and repeat cultures from 4/12 have remained without growth in 2 days and has not received any antibiotics. There is no concern for endocarditis infection at this point. Unclear source of diarrhea which she has not had bowel movement since admission. Question food related and recommend follow up with Gastroenterology as there does not appear to be an infectious source at present. Left sided buttock pain likely musculoskeletal in nature. No antibiotics are indicated in this situation and okay for discharge from ID standpoint.   PLAN:  1. No antibiotics indicated as Staphylococcus hominis is likely contaminant.  2. Diarrhea and left buttock pain per primary team. [facility] [place] for discharge from ID standpoint.  ID will sign off. Please re-consult if needed.     Principal Problem:   Atrial fibrillation with RVR (HCC) Active Problems:   DM2 (diabetes mellitus, type 2) (HCC)   SIRS (systemic inflammatory response syndrome) (HCC)   Chronic kidney disease, stage 3a (HCC)   Diarrhea   Chronic diastolic CHF (congestive heart failure) ([place])   . cyclobenzaprine  10 mg Oral TID  . diltiazem  240 mg Oral Daily  . enoxaparin (LOVENOX) injection  80 mg Subcutaneous Q12H  . gabapentin  100 mg Oral QHS  . insulin aspart  0-9 Units Subcutaneous TID WC  . lidocaine  1 patch Transdermal Q24H  . memantine  5 mg Oral BID  . mirabegron ER  25 mg Oral Daily  . pantoprazole  40 mg Oral Daily  . Warfarin  - Pharmacist Dosing Inpatient   Does not apply q1600     HPI: KESHONNA VALVO is a 80 y.o. female with previous medical history as detailed below and significant for mitral valve replacement, atrial fibrillation anticoagulated with warfarin, Type 2 diabetes, and hypertension admitted with heart palpitations, low back pain, diarrhea and fatigue.   Symptoms started about 3 days prior to arrival with watery diarrhea with frequency of about 4 times on Sunday. Previous episode of diarrhea about a month prior. Denies any nausea or vomiting. Also had left buttock pain that is aggravated with activity and improved with rest. Febrile with temperature of 101.4 on arrival with tachycardia. CT abdomen/pelvis with no acute intra-abdominal abnormality and chest x-ray without acute or active cardiopulmonary disease. Blood culture with Staphylococcus hominis in 1 set of cultures with other cultures without growth. Repeat cultures drawn on 4/.12 have been without growth to date. Not currently on antibiotics.  Ms. Hirt has been feeling better since admission. Has not had a bowel movement since her arrival. Denies fevers/chills, nausea, vomiting. Continues to have left sided buttock pain. No numbness or tingling or weakness present. Eating and drinking adequately.    Review of Systems: Review of Systems  Constitutional: Negative for chills, fever and weight loss.  Respiratory: Negative for cough, shortness of breath and wheezing.   Cardiovascular: Negative for chest pain and leg swelling.  Gastrointestinal: Negative for  abdominal pain, constipation, diarrhea, nausea and vomiting.  Musculoskeletal:       Left sided buttock pain.   Skin: Negative for rash.     Past Medical History:  Diagnosis Date  . AF (atrial fibrillation) ([place])   . ALLERGIC RHINITIS   . Blood transfusion without reported diagnosis   . Cataract, senile   . Chest pain   . CHF (congestive heart failure) ([place])   . Chronic anticoagulation    . Diabetes mellitus   . Diabetes mellitus   . Disorder of oral soft tissue    of mouth  . Diverticulosis   . GERD (gastroesophageal reflux disease)   . Hematoma   . Hypertension   . Leg pain   . Low back pain   . Paresthesia   . Rheumatic fever   . Status post mitral valve replacement    St. Jude valve  . Stroke ([place])    tia  . Sweating   . Vitamin D deficiency     Social History   Tobacco Use  . Smoking status: Former Smoker    Packs/day: 0.02    Years: 60.00    Pack years: 1.20    Types: Cigarettes    Quit date: 10/28/2002    Years since quitting: 18.2  . Smokeless tobacco: Never Used  . Tobacco comment: quit x 13 years   Vaping Use  . Vaping Use: Never used  Substance Use Topics  . Alcohol use: No    Alcohol/week: 0.0 standard drinks  . Drug use: No    Family History  Problem Relation Age of Onset  . Diabetes Mother   . Seizures Mother   . Other Mother        brain tumor  . Heart disease Brother   . Diabetes Brother   . Heart disease Brother   . Pancreatic cancer Brother   . Colon cancer Neg Hx     Allergies  Allergen Reactions  . Aricept [Donepezil Hcl]     Intolerance - bad dreams  . Food     Bananas and Fish, cause sinus drainage  . Furosemide     cramps  . Quinine Other (See Comments)    Can't hear  . Spironolactone     constipation  . Tramadol     headache  . Sulfadiazine Itching and Rash    OBJECTIVE: Blood pressure 116/74, pulse (!) 103, temperature 100.1 F (37.8 C), temperature source Oral, resp. rate 16, height 5\' 2"  (1.575 m), weight 80.5 kg, SpO2 94 %.  Physical Exam Constitutional:      General: She is not in acute distress.    Appearance: She is well-developed.     Comments: Lying in bed with head of bed partially elevated; pleasant.   Cardiovascular:     Rate and Rhythm: Normal rate and regular rhythm.     Heart sounds: Normal heart sounds.     Comments: Mitral valve click present.  Pulmonary:     Effort: Pulmonary  effort is normal.     Breath sounds: Normal breath sounds.  Abdominal:     General: Bowel sounds are normal. There is no distension.     Palpations: Abdomen is soft. There is no mass.     Tenderness: There is no abdominal tenderness. There is no guarding.  Skin:    General: Skin is warm and dry.  Neurological:     Mental Status: She is alert and oriented to person, place, and time.  Psychiatric:  Behavior: Behavior normal.        Thought Content: Thought content normal.        Judgment: Judgment normal.     Lab Results Lab Results  Component Value Date   WBC 7.3 02/08/2021   HGB 9.6 (L) 02/08/2021   HCT 28.3 (L) 02/08/2021   MCV 78.8 (L) 02/08/2021   PLT 240 02/08/2021    Lab Results  Component Value Date   CREATININE 0.96 02/08/2021   BUN 13 02/08/2021   NA 136 02/08/2021   K 4.0 02/08/2021   CL 102 02/08/2021   CO2 28 02/08/2021    Lab Results  Component Value Date   ALT 13 02/05/2021   AST 21 02/05/2021   ALKPHOS 61 02/05/2021   BILITOT 1.0 02/05/2021     Microbiology: Recent Results (from the past 240 hour(s))  Blood culture (routine x 2)     Status: Abnormal   Collection Time: 02/05/21  6:15 PM   Specimen: BLOOD  Result Value Ref Range Status   Specimen Description BLOOD SITE NOT SPECIFIED  Final   Special Requests   Final    BOTTLES DRAWN AEROBIC AND ANAEROBIC Blood Culture results may not be optimal due to an inadequate volume of blood received in culture bottles   Culture  Setup Time   Final    GRAM POSITIVE COCCI IN BOTH AEROBIC AND ANAEROBIC BOTTLES CRITICAL RESULT CALLED TO, READ BACK BY AND VERIFIED WITH: PHARMD T. DANG [phone] FCP    Culture (A)  Final    STAPHYLOCOCCUS HOMINIS THE SIGNIFICANCE OF ISOLATING THIS ORGANISM FROM A SINGLE SET OF BLOOD CULTURES WHEN MULTIPLE SETS ARE DRAWN IS UNCERTAIN. PLEASE NOTIFY THE MICROBIOLOGY DEPARTMENT WITHIN ONE WEEK IF SPECIATION AND SENSITIVITIES ARE REQUIRED. Performed at [facility], [place] [address] [place], [place] [address]    Report Status 02/08/2021 FINAL  Final  Blood Culture ID Panel (Reflexed)     Status: Abnormal   Collection Time: 02/05/21  6:15 PM  Result Value Ref Range Status   Enterococcus faecalis NOT DETECTED NOT DETECTED Final   Enterococcus Faecium NOT DETECTED NOT DETECTED Final   Listeria monocytogenes NOT DETECTED NOT DETECTED Final   Staphylococcus species DETECTED (A) NOT DETECTED Final    Comment: CRITICAL RESULT CALLED TO, READ BACK BY AND VERIFIED WITH: PHARMD T. DANG [phone] FCP    Staphylococcus aureus (BCID) NOT DETECTED NOT DETECTED Final   Staphylococcus epidermidis NOT DETECTED NOT DETECTED Final   Staphylococcus lugdunensis NOT DETECTED NOT DETECTED Final   Streptococcus species NOT DETECTED NOT DETECTED Final   Streptococcus agalactiae NOT DETECTED NOT DETECTED Final   Streptococcus pneumoniae NOT DETECTED NOT DETECTED Final   Streptococcus pyogenes NOT DETECTED NOT DETECTED Final   A.calcoaceticus-baumannii NOT DETECTED NOT DETECTED Final   Bacteroides fragilis NOT DETECTED NOT DETECTED Final   Enterobacterales NOT DETECTED NOT DETECTED Final   Enterobacter cloacae complex NOT DETECTED NOT DETECTED Final   Escherichia coli NOT DETECTED NOT DETECTED Final   Klebsiella aerogenes NOT DETECTED NOT DETECTED Final   Klebsiella oxytoca NOT DETECTED NOT DETECTED Final   Klebsiella pneumoniae NOT DETECTED NOT DETECTED Final   Proteus species NOT DETECTED NOT DETECTED Final   Salmonella species NOT DETECTED NOT DETECTED Final   Serratia marcescens NOT DETECTED NOT DETECTED Final   Haemophilus influenzae NOT DETECTED NOT DETECTED Final   Neisseria meningitidis NOT DETECTED NOT DETECTED Final   Pseudomonas aeruginosa NOT DETECTED NOT DETECTED Final   Stenotrophomonas maltophilia  NOT DETECTED NOT DETECTED Final   Candida albicans NOT DETECTED NOT DETECTED Final   Candida auris NOT DETECTED NOT DETECTED Final   Candida glabrata NOT  DETECTED NOT DETECTED Final   Candida krusei NOT DETECTED NOT DETECTED Final   Candida parapsilosis NOT DETECTED NOT DETECTED Final   Candida tropicalis NOT DETECTED NOT DETECTED Final   Cryptococcus neoformans/gattii NOT DETECTED NOT DETECTED Final    Comment: Performed at [facility], [place] [address] [place], [place] [address]  Blood culture (routine x 2)     Status: None (Preliminary result)   Collection Time: 02/05/21  7:17 PM   Specimen: BLOOD  Result Value Ref Range Status   Specimen Description BLOOD SITE NOT SPECIFIED  Final   Special Requests   Final    BOTTLES DRAWN AEROBIC AND ANAEROBIC Blood Culture adequate volume   Culture   Final    NO GROWTH 3 DAYS Performed at [facility], [address] [place], [place] [address]    Report Status PENDING  Incomplete  Resp Panel by RT-PCR (Flu A&B, Covid) Nasopharyngeal Swab     Status: None   Collection Time: 02/05/21 10:30 PM   Specimen: Nasopharyngeal Swab; Nasopharyngeal(NP) swabs in vial transport medium  Result Value Ref Range Status   SARS Coronavirus 2 by RT PCR NEGATIVE NEGATIVE Final    Comment: (NOTE) SARS-CoV-2 target nucleic acids are NOT DETECTED.  The SARS-CoV-2 RNA is generally detectable in upper respiratory specimens during the acute phase of infection. The lowest concentration of SARS-CoV-2 viral copies this assay can detect is 138 copies/mL. A negative result does not preclude SARS-Cov-2 infection and should not be used as the sole basis for treatment or other patient management decisions. A negative result may occur with  improper specimen collection/handling, submission of specimen other than nasopharyngeal swab, presence of viral mutation(s) within the areas targeted by this assay, and inadequate number of viral copies(<138 copies/mL). A negative result must be combined with clinical observations, patient history, and epidemiological information. The expected result is Negative.  Fact Sheet  for Patients:  EntrepreneurPulse.com.au  Fact Sheet for Healthcare Providers:  IncredibleEmployment.be  This test is no t yet approved or cleared by the [place] FDA and  has been authorized for detection and/or diagnosis of SARS-CoV-2 by FDA under an Emergency Use Authorization (EUA). This EUA will remain  in effect (meaning this test can be used) for the duration of the COVID-19 declaration under Section 564(b)(1) of the Act, 21 U.S.C.section 360bbb-3(b)(1), unless the authorization is terminated  or revoked sooner.       Influenza A by PCR NEGATIVE NEGATIVE Final   Influenza B by PCR NEGATIVE NEGATIVE Final    Comment: (NOTE) The Xpert Xpress SARS-CoV-2/FLU/RSV plus assay is intended as an aid in the diagnosis of influenza from Nasopharyngeal swab specimens and should not be used as a sole basis for treatment. Nasal washings and aspirates are unacceptable for Xpert Xpress SARS-CoV-2/FLU/RSV testing.  Fact Sheet for Patients: EntrepreneurPulse.com.au  Fact Sheet for Healthcare Providers: IncredibleEmployment.be  This test is not yet approved or cleared by the [place] FDA and has been authorized for detection and/or diagnosis of SARS-CoV-2 by FDA under an Emergency Use Authorization (EUA). This EUA will remain in effect (meaning this test can be used) for the duration of the COVID-19 declaration under Section 564(b)(1) of the Act, 21 U.S.C. section 360bbb-3(b)(1), unless the authorization is terminated or revoked.  Performed at [facility], [place]  [address] [place], [place] [address]   Culture, blood (routine x 2)     Status: None (Preliminary result)   Collection Time: 02/06/21  6:50 PM   Specimen: BLOOD RIGHT WRIST  Result Value Ref Range Status   Specimen Description BLOOD RIGHT WRIST  Final   Special Requests AEROBIC BOTTLE ONLY Blood Culture adequate volume  Final   Culture    Final    NO GROWTH 2 DAYS Performed at [facility], [place] [address] [place], [place] [address]    Report Status PENDING  Incomplete  Culture, blood (routine x 2)     Status: None (Preliminary result)   Collection Time: 02/06/21  7:06 PM   Specimen: BLOOD RIGHT WRIST  Result Value Ref Range Status   Specimen Description BLOOD RIGHT WRIST  Final   Special Requests AEROBIC BOTTLE ONLY Blood Culture adequate volume  Final   Culture   Final    NO GROWTH 2 DAYS Performed at [facility], [place] [address] [place], [place] [address]    Report Status PENDING  Incomplete     Terri Piedra, NP [facility]ious [facility]  02/08/2021  11:36 AM

## 2021-02-08 NOTE — Progress Notes (Signed)
Mobility Specialist - Progress Note   02/08/21 1131  Mobility  Activity Ambulated in hall  Level of Assistance Standby assist, set-up cues, supervision of patient - no hands on  Assistive Device Front wheel walker  Distance Ambulated (ft) 430 ft  Mobility Response Tolerated fair  Mobility performed by Mobility specialist  $Mobility charge 1 Mobility   Pre-mobility: 100 HR During mobility: 155 HR Post-mobility: 108 HR  Pt c/o L sided lower back pain that radiated to the R when standing. She rated it a 10/10. HR elevated w/ ambulation, pt denied any chest pain or palpitations. Pt sitting up on edge of bed after walk to eat her breakfast.   [place] Specialist Mobility Specialist Phone: [phone]

## 2021-02-08 NOTE — Progress Notes (Signed)
[facility] for Warfarin/lovenox Indication: Afib/MVR  Allergies  Allergen Reactions  . Aricept [Donepezil Hcl]     Intolerance - bad dreams  . Food     Bananas and Fish, cause sinus drainage  . Furosemide     cramps  . Quinine Other (See Comments)    Can't hear  . Spironolactone     constipation  . Tramadol     headache  . Sulfadiazine Itching and Rash    Patient Measurements: Height: 5\' 2"  (157.5 cm) Weight: 80.5 kg (177 lb 7.5 oz) IBW/kg (Calculated) : 50.1  Vital Signs: Temp: 98.4 F (36.9 C) (04/14 1254) Temp Source: Oral (04/14 1254) BP: 100/69 (04/14 1323) Pulse Rate: 102 (04/14 1254)  Labs: Recent Labs    02/05/21 1814 02/05/21 1911 02/05/21 2014 02/06/21 0705 02/07/21 0413 02/08/21 0209  HGB 10.7*   < >  --  10.0* 9.9* 9.6*  HCT 31.4*   < >  --  29.2* 29.1* 28.3*  PLT 261  --   --  238 230 240  LABPROT 21.8*  --   --  20.5* 23.3* 26.4*  INR 2.0*  --   --  1.8* 2.1* 2.4*  CREATININE 1.09*   < >  --  0.90 0.94 0.96  TROPONINIHS 13  --  18*  --   --   --    < > = values in this interval not displayed.    Estimated Creatinine Clearance: 45.2 mL/min (by C-G formula based on SCr of 0.96 mg/dL).   Medical History: Past Medical History:  Diagnosis Date  . AF (atrial fibrillation) ([place])   . ALLERGIC RHINITIS   . Blood transfusion without reported diagnosis   . Cataract, senile   . Chest pain   . CHF (congestive heart failure) ([place])   . Chronic anticoagulation   . Diabetes mellitus   . Diabetes mellitus   . Disorder of oral soft tissue    of mouth  . Diverticulosis   . GERD (gastroesophageal reflux disease)   . Hematoma   . Hypertension   . Leg pain   . Low back pain   . Paresthesia   . Rheumatic fever   . Status post mitral valve replacement    St. Jude valve  . Stroke ([place])    tia  . Sweating   . Vitamin D deficiency      Assessment: 80 y/o F presents to the ED with palpitations, low back  pain, and diarrhea. On warfarin PTA for Afib and MVR.  (goal 2.5-3.5). Last outpatient INR was 2.1 on 3/21. Lovenox added 4/12 -INR= 2.4 (trend up)  Outpatient warfarin dosing per recent anti-coag notes: 15 mg on Mon/Fri and 10 mg all other days  Goal of Therapy:  INR 2.5-3.5 per outpatient anti-coag notes Monitor platelets by anticoagulation protocol: Yes   Plan:  Warfarin 10 mg PO x 1 Daily PT/INR   Hildred Laser, PharmD Clinical Pharmacist **Pharmacist phone directory can now be found on [email or web address] ([email or web address]).  Listed under [facility].

## 2021-02-08 NOTE — Progress Notes (Signed)
Pt's HR=120-150's. Pt asymptomatic. EKG obtained and placed in pt's chart.

## 2021-02-08 NOTE — Progress Notes (Addendum)
PROGRESS NOTE    Beth Miller   KXF:[id number]  DOB: 1941-07-17  DOA: 02/05/2021 PCP: Cassandria Anger, MD   Brief Narrative:  Beth Miller is an 80 year old female with mechanical mitral valve on Coumadin, chronic diastolic heart failure, type 2 diabetes mellitus, hypertension who presented to the hospital with diarrhea occurring on Sunday.  She said she had 4 episodes of watery diarrhea with abdominal pain and fatigue. ED: Temperature 101.4, tachycardic, blood pressure 109/95, respiratory rate in 20s.  Cardiac rhythm was atrial fibrillation.   She was started on IV Cardizem infusion after bolus IV fluids.  Subjective: No new complaints.     Assessment & Plan:   Principal Problem:   Atrial fibrillation with RVR  -Off Cardizem infusion and on oral Cardizem now at a dose of 240 mg (dose increased today as she is still tachycardic) -Heart rate in the low 100s- CV stop showing some RVR today- add metoprolol today  Active Problems:  Abdominal pain, diarrhea with fever, tachycardia, tachypnea -sepsis -abdominal pain and diarrhea as resolved-she states she did not eat anything out of the ordinary or take any laxatives - ongoing fever today> 100.1- this is despite taking TID Acetaminophen and Hydrocodone/acetominophen- stop acetaminophen and cont to follow temps closely- discussed with ID  1 set of blood cultures positive for staph hominis -Repeat blood cultures are pending -Temperatures have been about 99 degrees -I have consulted ID who believes it is a contaminant  Left gluteal pain -On exam, she is in pain when I squeeze her upper outer gluteal area-I believe this is musculoskeletal-my colleague has started Flexeril 3 times daily and a lidocaine patch which can be continued - I have asked her to avoid activities that exacerbate the pain -  K pad ordered - she can have PRN Oxycodone for severe pain instead of Hydrocodone    DM2 (diabetes mellitus, type 2)  -Continue  sliding scale insulin   Chemical mitral valve with subtherapeutic INR -INR is 2.4 today-continue Lovenox and Coumadin   Time spent in minutes: 35 DVT prophylaxis: Lovenox Code Status: Full code Family Communication:  Level of Care: Level of care: Progressive Disposition Plan:  Status is: Inpatient  Remains inpatient appropriate because:Inpatient level of care appropriate due to severity of illness   Dispo: The patient is from: Home              Anticipated d/c is to: Home              Patient currently is not medically stable to d/c.   Difficult to place patient No      Consultants:   ID Procedures:   none Antimicrobials:  Anti-infectives (From admission, onward)   None       Objective: Vitals:   02/08/21 0349 02/08/21 0500 02/08/21 0800 02/08/21 1254  BP: 109/64  116/74 122/72  Pulse:   (!) 103 (!) 102  Resp: 16     Temp: 98.3 F (36.8 C)  100.1 F (37.8 C) 98.4 F (36.9 C)  TempSrc: Oral  Oral Oral  SpO2: 99%  94% 95%  Weight:  80.5 kg    Height:       No intake or output data in the 24 hours ending 02/08/21 1613 Filed Weights   02/06/21 0003 02/07/21 0355 02/08/21 0500  Weight: 81.1 kg 80.5 kg 80.5 kg    Examination: General exam: Appears comfortable  HEENT: PERRLA, oral mucosa moist, no sclera icterus or thrush Respiratory system: Clear to auscultation.  Respiratory effort normal. Cardiovascular system: S1 & S2 heard, irregularly irregular rate and rhythm Gastrointestinal system: Abdomen soft, non-tender, nondistended. Normal bowel sounds   Central nervous system: Alert and oriented. No focal neurological deficits. Extremities: No cyanosis, clubbing or edema Skin: No rashes or ulcers Psychiatry:  Mood & affect appropriate.     Data Reviewed: I have personally reviewed following labs and imaging studies  CBC: Recent Labs  Lab 02/05/21 1814 02/05/21 1911 02/06/21 0705 02/07/21 0413 02/08/21 0209  WBC 8.5  --  6.9 6.8 7.3  NEUTROABS  6.4  --   --   --   --   HGB 10.7* 11.2* 10.0* 9.9* 9.6*  HCT 31.4* 33.0* 29.2* 29.1* 28.3*  MCV 80.1  --  79.3* 79.7* 78.8*  PLT 261  --  238 230 301   Basic Metabolic Panel: Recent Labs  Lab 02/05/21 1814 02/05/21 1911 02/06/21 0705 02/07/21 0413 02/08/21 0209  NA 135 136 139 136 136  K 4.1 4.2 3.8 3.7 4.0  CL 102 102 103 103 102  CO2 27  --  27 27 28   GLUCOSE 152* 153* 113* 112* 155*  BUN 16 17 13 10 13   CREATININE 1.09* 1.10* 0.90 0.94 0.96  CALCIUM 9.7  --  9.5 9.3 9.3  MG  --   --  1.7  --   --    GFR: Estimated Creatinine Clearance: 45.2 mL/min (by C-G formula based on SCr of 0.96 mg/dL). Liver Function Tests: Recent Labs  Lab 02/05/21 1814  AST 21  ALT 13  ALKPHOS 61  BILITOT 1.0  PROT 6.9  ALBUMIN 3.7   Recent Labs  Lab 02/05/21 1814  LIPASE 47   No results for input(s): AMMONIA in the last 168 hours. Coagulation Profile: Recent Labs  Lab 02/05/21 1814 02/06/21 0705 02/07/21 0413 02/08/21 0209  INR 2.0* 1.8* 2.1* 2.4*   Cardiac Enzymes: No results for input(s): CKTOTAL, CKMB, CKMBINDEX, TROPONINI in the last 168 hours. BNP (last 3 results) No results for input(s): PROBNP in the last 8760 hours. HbA1C: No results for input(s): HGBA1C in the last 72 hours. CBG: Recent Labs  Lab 02/07/21 1101 02/07/21 1625 02/07/21 2019 02/08/21 0639 02/08/21 1157  GLUCAP 152* 82 125* 134* 175*   Lipid Profile: No results for input(s): CHOL, HDL, LDLCALC, TRIG, CHOLHDL, LDLDIRECT in the last 72 hours. Thyroid Function Tests: No results for input(s): TSH, T4TOTAL, FREET4, T3FREE, THYROIDAB in the last 72 hours. Anemia Panel: No results for input(s): VITAMINB12, FOLATE, FERRITIN, TIBC, IRON, RETICCTPCT in the last 72 hours. Urine analysis:    Component Value Date/Time   COLORURINE YELLOW 02/05/2021 2257   APPEARANCEUR CLEAR 02/05/2021 2257   LABSPEC >1.046 (H) 02/05/2021 2257   PHURINE 6.0 02/05/2021 2257   GLUCOSEU NEGATIVE 02/05/2021 2257    GLUCOSEU NEGATIVE 04/27/2019 1041   HGBUR MODERATE (A) 02/05/2021 2257   BILIRUBINUR NEGATIVE 02/05/2021 2257   KETONESUR NEGATIVE 02/05/2021 2257   PROTEINUR NEGATIVE 02/05/2021 2257   UROBILINOGEN 0.2 04/27/2019 1041   NITRITE NEGATIVE 02/05/2021 2257   LEUKOCYTESUR NEGATIVE 02/05/2021 2257   Sepsis Labs: @LABRCNTIP (procalcitonin:4,lacticidven:4) ) Recent Results (from the past 240 hour(s))  Blood culture (routine x 2)     Status: Abnormal   Collection Time: 02/05/21  6:15 PM   Specimen: BLOOD  Result Value Ref Range Status   Specimen Description BLOOD SITE NOT SPECIFIED  Final   Special Requests   Final    BOTTLES DRAWN AEROBIC AND ANAEROBIC Blood Culture results may not  be optimal due to an inadequate volume of blood received in culture bottles   Culture  Setup Time   Final    GRAM POSITIVE COCCI IN BOTH AEROBIC AND ANAEROBIC BOTTLES CRITICAL RESULT CALLED TO, READ BACK BY AND VERIFIED WITH: PHARMD T. DANG [id number] FCP    Culture (A)  Final    STAPHYLOCOCCUS HOMINIS THE SIGNIFICANCE OF ISOLATING THIS ORGANISM FROM A SINGLE SET OF BLOOD CULTURES WHEN MULTIPLE SETS ARE DRAWN IS UNCERTAIN. PLEASE NOTIFY THE MICROBIOLOGY DEPARTMENT WITHIN ONE WEEK IF SPECIATION AND SENSITIVITIES ARE REQUIRED. Performed at [facility], [place] [address], [place], [place] [address]    Report Status 02/08/2021 FINAL  Final  Blood Culture ID Panel (Reflexed)     Status: Abnormal   Collection Time: 02/05/21  6:15 PM  Result Value Ref Range Status   Enterococcus faecalis NOT DETECTED NOT DETECTED Final   Enterococcus Faecium NOT DETECTED NOT DETECTED Final   Listeria monocytogenes NOT DETECTED NOT DETECTED Final   Staphylococcus species DETECTED (A) NOT DETECTED Final    Comment: CRITICAL RESULT CALLED TO, READ BACK BY AND VERIFIED WITH: PHARMD T. DANG [id number] FCP    Staphylococcus aureus (BCID) NOT DETECTED NOT DETECTED Final   Staphylococcus epidermidis NOT DETECTED NOT DETECTED Final    Staphylococcus lugdunensis NOT DETECTED NOT DETECTED Final   Streptococcus species NOT DETECTED NOT DETECTED Final   Streptococcus agalactiae NOT DETECTED NOT DETECTED Final   Streptococcus pneumoniae NOT DETECTED NOT DETECTED Final   Streptococcus pyogenes NOT DETECTED NOT DETECTED Final   A.calcoaceticus-baumannii NOT DETECTED NOT DETECTED Final   Bacteroides fragilis NOT DETECTED NOT DETECTED Final   Enterobacterales NOT DETECTED NOT DETECTED Final   Enterobacter cloacae complex NOT DETECTED NOT DETECTED Final   Escherichia coli NOT DETECTED NOT DETECTED Final   Klebsiella aerogenes NOT DETECTED NOT DETECTED Final   Klebsiella oxytoca NOT DETECTED NOT DETECTED Final   Klebsiella pneumoniae NOT DETECTED NOT DETECTED Final   Proteus species NOT DETECTED NOT DETECTED Final   Salmonella species NOT DETECTED NOT DETECTED Final   Serratia marcescens NOT DETECTED NOT DETECTED Final   Haemophilus influenzae NOT DETECTED NOT DETECTED Final   Neisseria meningitidis NOT DETECTED NOT DETECTED Final   Pseudomonas aeruginosa NOT DETECTED NOT DETECTED Final   Stenotrophomonas maltophilia NOT DETECTED NOT DETECTED Final   Candida albicans NOT DETECTED NOT DETECTED Final   Candida auris NOT DETECTED NOT DETECTED Final   Candida glabrata NOT DETECTED NOT DETECTED Final   Candida krusei NOT DETECTED NOT DETECTED Final   Candida parapsilosis NOT DETECTED NOT DETECTED Final   Candida tropicalis NOT DETECTED NOT DETECTED Final   Cryptococcus neoformans/gattii NOT DETECTED NOT DETECTED Final    Comment: Performed at [facility], [place] [address] [place], [place] [address]  Blood culture (routine x 2)     Status: None (Preliminary result)   Collection Time: 02/05/21  7:17 PM   Specimen: BLOOD  Result Value Ref Range Status   Specimen Description BLOOD SITE NOT SPECIFIED  Final   Special Requests   Final    BOTTLES DRAWN AEROBIC AND ANAEROBIC Blood Culture adequate volume   Culture   Final     NO GROWTH 3 DAYS Performed at [facility], [address] [place], [place] [address]    Report Status PENDING  Incomplete  Resp Panel by RT-PCR (Flu A&B, Covid) Nasopharyngeal Swab     Status: None   Collection Time: 02/05/21 10:30 PM  Specimen: Nasopharyngeal Swab; Nasopharyngeal(NP) swabs in vial transport medium  Result Value Ref Range Status   SARS Coronavirus 2 by RT PCR NEGATIVE NEGATIVE Final    Comment: (NOTE) SARS-CoV-2 target nucleic acids are NOT DETECTED.  The SARS-CoV-2 RNA is generally detectable in upper respiratory specimens during the acute phase of infection. The lowest concentration of SARS-CoV-2 viral copies this assay can detect is 138 copies/mL. A negative result does not preclude SARS-Cov-2 infection and should not be used as the sole basis for treatment or other patient management decisions. A negative result may occur with  improper specimen collection/handling, submission of specimen other than nasopharyngeal swab, presence of viral mutation(s) within the areas targeted by this assay, and inadequate number of viral copies(<138 copies/mL). A negative result must be combined with clinical observations, patient history, and epidemiological information. The expected result is Negative.  Fact Sheet for Patients:  EntrepreneurPulse.com.au  Fact Sheet for Healthcare Providers:  IncredibleEmployment.be  This test is no t yet approved or cleared by the [place] FDA and  has been authorized for detection and/or diagnosis of SARS-CoV-2 by FDA under an Emergency Use Authorization (EUA). This EUA will remain  in effect (meaning this test can be used) for the duration of the COVID-19 declaration under Section 564(b)(1) of the Act, 21 U.S.C.section 360bbb-3(b)(1), unless the authorization is terminated  or revoked sooner.       Influenza A by PCR NEGATIVE NEGATIVE Final   Influenza B by PCR NEGATIVE NEGATIVE Final     Comment: (NOTE) The Xpert Xpress SARS-CoV-2/FLU/RSV plus assay is intended as an aid in the diagnosis of influenza from Nasopharyngeal swab specimens and should not be used as a sole basis for treatment. Nasal washings and aspirates are unacceptable for Xpert Xpress SARS-CoV-2/FLU/RSV testing.  Fact Sheet for Patients: EntrepreneurPulse.com.au  Fact Sheet for Healthcare Providers: IncredibleEmployment.be  This test is not yet approved or cleared by the [place] FDA and has been authorized for detection and/or diagnosis of SARS-CoV-2 by FDA under an Emergency Use Authorization (EUA). This EUA will remain in effect (meaning this test can be used) for the duration of the COVID-19 declaration under Section 564(b)(1) of the Act, 21 U.S.C. section 360bbb-3(b)(1), unless the authorization is terminated or revoked.  Performed at [facility], [place] [address] [place], [place] [address]   Culture, blood (routine x 2)     Status: None (Preliminary result)   Collection Time: 02/06/21  6:50 PM   Specimen: BLOOD RIGHT WRIST  Result Value Ref Range Status   Specimen Description BLOOD RIGHT WRIST  Final   Special Requests AEROBIC BOTTLE ONLY Blood Culture adequate volume  Final   Culture   Final    NO GROWTH 2 DAYS Performed at [facility], [place] [address] [place], [place] [address]    Report Status PENDING  Incomplete  Culture, blood (routine x 2)     Status: None (Preliminary result)   Collection Time: 02/06/21  7:06 PM   Specimen: BLOOD RIGHT WRIST  Result Value Ref Range Status   Specimen Description BLOOD RIGHT WRIST  Final   Special Requests AEROBIC BOTTLE ONLY Blood Culture adequate volume  Final   Culture   Final    NO GROWTH 2 DAYS Performed at [facility], [place] [address], [place], [place] [address]    Report Status PENDING  Incomplete         Radiology Studies: No results found.    Scheduled Meds: .  cyclobenzaprine  10 mg Oral TID  . diltiazem  240 mg Oral Daily  . enoxaparin (LOVENOX) injection  80 mg Subcutaneous Q12H  . gabapentin  100 mg Oral QHS  . insulin aspart  0-9 Units Subcutaneous TID WC  . lidocaine  1 patch Transdermal Q24H  . memantine  5 mg Oral BID  . mirabegron ER  25 mg Oral Daily  . pantoprazole  40 mg Oral Daily  . warfarin  10 mg Oral ONCE-1600  . Warfarin - Pharmacist Dosing Inpatient   Does not apply q1600   Continuous Infusions:   LOS: 3 days      Debbe Odea, MD [facility] Pager: [email or web address] 02/08/2021, 4:13 PM

## 2021-02-08 NOTE — Care Management Important Message (Signed)
Important Message  Patient Details  Name: Beth Miller MRN: [id number] Date of Birth: 02/08/1941   Medicare Important Message Given:  Yes     Eulamae Greenstein [address] 02/08/2021, 1:15 PM

## 2021-02-09 ENCOUNTER — Inpatient Hospital Stay (HOSPITAL_COMMUNITY): Payer: Medicare Other

## 2021-02-09 LAB — GLUCOSE, CAPILLARY
Glucose-Capillary: 101 mg/dL — ABNORMAL HIGH (ref 70–99)
Glucose-Capillary: 99 mg/dL (ref 70–99)
Glucose-Capillary: 119 mg/dL — ABNORMAL HIGH (ref 70–99)
Glucose-Capillary: 137 mg/dL — ABNORMAL HIGH (ref 70–99)

## 2021-02-09 LAB — URINALYSIS, ROUTINE W REFLEX MICROSCOPIC
Bilirubin Urine: NEGATIVE
Glucose, UA: NEGATIVE mg/dL
Ketones, ur: NEGATIVE mg/dL
Leukocytes,Ua: NEGATIVE
Nitrite: NEGATIVE
Protein, ur: NEGATIVE mg/dL
Specific Gravity, Urine: 1.008 (ref 1.005–1.030)
pH: 6 (ref 5.0–8.0)

## 2021-02-09 LAB — PROTIME-INR
INR: 2.3 — ABNORMAL HIGH (ref 0.8–1.2)
Prothrombin Time: 25.4 seconds — ABNORMAL HIGH (ref 11.4–15.2)

## 2021-02-09 LAB — PROCALCITONIN: Procalcitonin: <0.1 ng/mL

## 2021-02-09 MED ORDER — CYCLOBENZAPRINE HCL 10 MG PO TABS
10.0000 mg | ORAL_TABLET | Freq: Three times a day (TID) | ORAL | Status: DC | PRN
  Administered 2021-02-09 – 2021-02-11 (×4): 10 mg via ORAL
  Filled 2021-02-09 (×4): qty 1

## 2021-02-09 MED ORDER — GADOBUTROL 1 MMOL/ML IV SOLN
7.5000 mL | Freq: Once | INTRAVENOUS | Status: AC | PRN
  Administered 2021-02-09: 7.5 mL via INTRAVENOUS

## 2021-02-09 MED ORDER — WARFARIN SODIUM 7.5 MG PO TABS
15.0000 mg | ORAL_TABLET | Freq: Once | ORAL | Status: AC
  Administered 2021-02-09: 15 mg via ORAL
  Filled 2021-02-09: qty 2

## 2021-02-09 NOTE — Progress Notes (Addendum)
PROGRESS NOTE    Beth Miller   SKA:[id number]  DOB: Dec 20, 1940  DOA: 02/05/2021 PCP: Cassandria Anger, MD   Brief Narrative:  Beth Miller is an 80 year old female with mechanical mitral valve on Coumadin, chronic diastolic heart failure, type 2 diabetes mellitus, hypertension who presented to the hospital with abdominal pain diarrhea occurring on Sunday.  She said she had 4 episodes of watery diarrhea with abdominal pain and fatigue.  ED: Temperature 101.4, tachycardic, blood pressure 109/95, respiratory rate in 20s.  Cardiac rhythm was atrial fibrillation.   She was started on IV Cardizem infusion after bolus IV fluids. No antibiotics given. Later complained of left buttock pain with movement.  Subjective: Left buttock pain is improving. Able to ambulate better. She had a fever last night but did not notice it. She has no complaints.     Assessment & Plan:   Principal Problem: Abdominal pain, diarrhea with fever, tachycardia, tachypnea -sepsis POA - ongoing fevers -abdominal pain and diarrhea resolved - he states she did not eat anything out of the ordinary or take any laxatives - tachycardia being controlled with meds (A fib) - 4/14>100.1- this is despite taking TID Acetaminophen and Hydrocodone/acetominophen- stop acetaminophen and cont to follow temps closely - 4/15> fever overnight 101.6 - she has no respiratory symptoms, no GI symptoms, no dental pain, no joint pain or rash, no headache - she has urinary incontinence and repeat UA is negative - CT abd/pelvis on admission unrevealing - procalcitonin < 0.10 - HIV non reactive - Hepatitis panel negative - ID has ordered MRI left hip, 2 D ECHO   Active Problems:    Atrial fibrillation with RVR  -Off Cardizem infusion and on oral Cardizem now at a dose of 240 mg - 4/14>  added metoprolol - HR improved- follow BP closely  1 set of blood cultures positive for staph hominis -Repeat blood cultures are  Negative -  ID feels it is a contaminant  Left gluteal pain -On exam on 4/13, she had in pain when I squeeze her upper outer gluteal area-I believe this is musculoskeletal-my colleague has started Flexeril 3 times daily and a lidocaine patch which can be continued - I have asked her to avoid activities that exacerbate the pain - she can have PRN Oxycodone for severe pain  - she states it is improving and she is able to move around better    DM2 (diabetes mellitus, type 2)  -Continue sliding scale insulin - A1c 6.8 on 4/5   Chemical mitral valve with subtherapeutic INR -INR is 2.3 today-continue Lovenox and Coumadin per pharmacy - she also has hematuria which is likely related to this mechanical valve  Time spent in minutes: 35 DVT prophylaxis: Lovenox Code Status: Full code Family Communication:  Level of Care: Level of care: Progressive- transition to telemetry Disposition Plan:  Status is: Inpatient  Remains inpatient appropriate because:Inpatient level of care appropriate due to severity of illness- ongoing fevers w/o a source   Dispo: The patient is from: Home              Anticipated d/c is to: Home              Patient currently is not medically stable to d/c.   Difficult to place patient No      Consultants:   ID Procedures:   none Antimicrobials:  Anti-infectives (From admission, onward)   None       Objective: Vitals:   02/09/21 0421 02/09/21  7793 02/09/21 0900 02/09/21 1232  BP: 105/74  (!) 102/57 98/72  Pulse: 97  99   Resp: 16  20 18   Temp: 98.7 F (37.1 C)  98.6 F (37 C) 98.5 F (36.9 C)  TempSrc: Oral  Oral Oral  SpO2: 92%  94% 96%  Weight:  79.9 kg    Height:       No intake or output data in the 24 hours ending 02/09/21 1505 Filed Weights   02/07/21 0355 02/08/21 0500 02/09/21 0629  Weight: 80.5 kg 80.5 kg 79.9 kg    Examination: General exam: Appears comfortable  HEENT: PERRLA, oral mucosa moist, no sclera icterus or  thrush Respiratory system: Clear to auscultation. Respiratory effort normal. Cardiovascular system: S1 & S2 heard, IIRR Gastrointestinal system: Abdomen soft, non-tender, nondistended. Normal bowel sounds   Central nervous system: Alert and oriented. No focal neurological deficits. Extremities: No cyanosis, clubbing or edema Skin: No rashes or ulcers Psychiatry:  Mood & affect appropriate.     Data Reviewed: I have personally reviewed following labs and imaging studies  CBC: Recent Labs  Lab 02/05/21 1814 02/05/21 1911 02/06/21 0705 02/07/21 0413 02/08/21 0209  WBC 8.5  --  6.9 6.8 7.3  NEUTROABS 6.4  --   --   --   --   HGB 10.7* 11.2* 10.0* 9.9* 9.6*  HCT 31.4* 33.0* 29.2* 29.1* 28.3*  MCV 80.1  --  79.3* 79.7* 78.8*  PLT 261  --  238 230 903   Basic Metabolic Panel: Recent Labs  Lab 02/05/21 1814 02/05/21 1911 02/06/21 0705 02/07/21 0413 02/08/21 0209  NA 135 136 139 136 136  K 4.1 4.2 3.8 3.7 4.0  CL 102 102 103 103 102  CO2 27  --  27 27 28   GLUCOSE 152* 153* 113* 112* 155*  BUN 16 17 13 10 13   CREATININE 1.09* 1.10* 0.90 0.94 0.96  CALCIUM 9.7  --  9.5 9.3 9.3  MG  --   --  1.7  --   --    GFR: Estimated Creatinine Clearance: 45 mL/min (by C-G formula based on SCr of 0.96 mg/dL). Liver Function Tests: Recent Labs  Lab 02/05/21 1814  AST 21  ALT 13  ALKPHOS 61  BILITOT 1.0  PROT 6.9  ALBUMIN 3.7   Recent Labs  Lab 02/05/21 1814  LIPASE 47   No results for input(s): AMMONIA in the last 168 hours. Coagulation Profile: Recent Labs  Lab 02/05/21 1814 02/06/21 0705 02/07/21 0413 02/08/21 0209 02/09/21 0250  INR 2.0* 1.8* 2.1* 2.4* 2.3*   Cardiac Enzymes: No results for input(s): CKTOTAL, CKMB, CKMBINDEX, TROPONINI in the last 168 hours. BNP (last 3 results) No results for input(s): PROBNP in the last 8760 hours. HbA1C: No results for input(s): HGBA1C in the last 72 hours. CBG: Recent Labs  Lab 02/08/21 1157 02/08/21 1645  02/08/21 2143 02/09/21 0614 02/09/21 1114  GLUCAP 175* 116* 117* 101* 99   Lipid Profile: No results for input(s): CHOL, HDL, LDLCALC, TRIG, CHOLHDL, LDLDIRECT in the last 72 hours. Thyroid Function Tests: No results for input(s): TSH, T4TOTAL, FREET4, T3FREE, THYROIDAB in the last 72 hours. Anemia Panel: No results for input(s): VITAMINB12, FOLATE, FERRITIN, TIBC, IRON, RETICCTPCT in the last 72 hours. Urine analysis:    Component Value Date/Time   COLORURINE YELLOW 02/09/2021 [place] 02/09/2021 0917   LABSPEC 1.008 02/09/2021 0917   PHURINE 6.0 02/09/2021 0917   GLUCOSEU NEGATIVE 02/09/2021 0917   GLUCOSEU NEGATIVE  04/27/2019 1041   HGBUR LARGE (A) 02/09/2021 0917   BILIRUBINUR NEGATIVE 02/09/2021 0917   KETONESUR NEGATIVE 02/09/2021 0917   PROTEINUR NEGATIVE 02/09/2021 0917   UROBILINOGEN 0.2 04/27/2019 1041   NITRITE NEGATIVE 02/09/2021 0917   LEUKOCYTESUR NEGATIVE 02/09/2021 0917   Sepsis Labs: @LABRCNTIP (procalcitonin:4,lacticidven:4) ) Recent Results (from the past 240 hour(s))  Blood culture (routine x 2)     Status: Abnormal   Collection Time: 02/05/21  6:15 PM   Specimen: BLOOD  Result Value Ref Range Status   Specimen Description BLOOD SITE NOT SPECIFIED  Final   Special Requests   Final    BOTTLES DRAWN AEROBIC AND ANAEROBIC Blood Culture results may not be optimal due to an inadequate volume of blood received in culture bottles   Culture  Setup Time   Final    GRAM POSITIVE COCCI IN BOTH AEROBIC AND ANAEROBIC BOTTLES CRITICAL RESULT CALLED TO, READ BACK BY AND VERIFIED WITH: PHARMD T. DANG [phone] FCP    Culture (A)  Final    STAPHYLOCOCCUS HOMINIS THE SIGNIFICANCE OF ISOLATING THIS ORGANISM FROM A SINGLE SET OF BLOOD CULTURES WHEN MULTIPLE SETS ARE DRAWN IS UNCERTAIN. PLEASE NOTIFY THE MICROBIOLOGY DEPARTMENT WITHIN ONE WEEK IF SPECIATION AND SENSITIVITIES ARE REQUIRED. Performed at [facility], [place] [address],  [place], [place] [address]    Report Status 02/08/2021 FINAL  Final  Blood Culture ID Panel (Reflexed)     Status: Abnormal   Collection Time: 02/05/21  6:15 PM  Result Value Ref Range Status   Enterococcus faecalis NOT DETECTED NOT DETECTED Final   Enterococcus Faecium NOT DETECTED NOT DETECTED Final   Listeria monocytogenes NOT DETECTED NOT DETECTED Final   Staphylococcus species DETECTED (A) NOT DETECTED Final    Comment: CRITICAL RESULT CALLED TO, READ BACK BY AND VERIFIED WITH: PHARMD T. DANG [phone] FCP    Staphylococcus aureus (BCID) NOT DETECTED NOT DETECTED Final   Staphylococcus epidermidis NOT DETECTED NOT DETECTED Final   Staphylococcus lugdunensis NOT DETECTED NOT DETECTED Final   Streptococcus species NOT DETECTED NOT DETECTED Final   Streptococcus agalactiae NOT DETECTED NOT DETECTED Final   Streptococcus pneumoniae NOT DETECTED NOT DETECTED Final   Streptococcus pyogenes NOT DETECTED NOT DETECTED Final   A.calcoaceticus-baumannii NOT DETECTED NOT DETECTED Final   Bacteroides fragilis NOT DETECTED NOT DETECTED Final   Enterobacterales NOT DETECTED NOT DETECTED Final   Enterobacter cloacae complex NOT DETECTED NOT DETECTED Final   Escherichia coli NOT DETECTED NOT DETECTED Final   Klebsiella aerogenes NOT DETECTED NOT DETECTED Final   Klebsiella oxytoca NOT DETECTED NOT DETECTED Final   Klebsiella pneumoniae NOT DETECTED NOT DETECTED Final   Proteus species NOT DETECTED NOT DETECTED Final   Salmonella species NOT DETECTED NOT DETECTED Final   Serratia marcescens NOT DETECTED NOT DETECTED Final   Haemophilus influenzae NOT DETECTED NOT DETECTED Final   Neisseria meningitidis NOT DETECTED NOT DETECTED Final   Pseudomonas aeruginosa NOT DETECTED NOT DETECTED Final   Stenotrophomonas maltophilia NOT DETECTED NOT DETECTED Final   Candida albicans NOT DETECTED NOT DETECTED Final   Candida auris NOT DETECTED NOT DETECTED Final   Candida glabrata NOT DETECTED NOT DETECTED  Final   Candida krusei NOT DETECTED NOT DETECTED Final   Candida parapsilosis NOT DETECTED NOT DETECTED Final   Candida tropicalis NOT DETECTED NOT DETECTED Final   Cryptococcus neoformans/gattii NOT DETECTED NOT DETECTED Final    Comment: Performed at [facility], [place] [address], [place], [place] [address]  Blood culture (routine x 2)     Status: None (Preliminary result)   Collection Time: 02/05/21  7:17 PM   Specimen: BLOOD  Result Value Ref Range Status   Specimen Description BLOOD SITE NOT SPECIFIED  Final   Special Requests   Final    BOTTLES DRAWN AEROBIC AND ANAEROBIC Blood Culture adequate volume   Culture   Final    NO GROWTH 4 DAYS Performed at [facility], [address] [place], [place] [address]    Report Status PENDING  Incomplete  Resp Panel by RT-PCR (Flu A&B, Covid) Nasopharyngeal Swab     Status: None   Collection Time: 02/05/21 10:30 PM   Specimen: Nasopharyngeal Swab; Nasopharyngeal(NP) swabs in vial transport medium  Result Value Ref Range Status   SARS Coronavirus 2 by RT PCR NEGATIVE NEGATIVE Final    Comment: (NOTE) SARS-CoV-2 target nucleic acids are NOT DETECTED.  The SARS-CoV-2 RNA is generally detectable in upper respiratory specimens during the acute phase of infection. The lowest concentration of SARS-CoV-2 viral copies this assay can detect is 138 copies/mL. A negative result does not preclude SARS-Cov-2 infection and should not be used as the sole basis for treatment or other patient management decisions. A negative result may occur with  improper specimen collection/handling, submission of specimen other than nasopharyngeal swab, presence of viral mutation(s) within the areas targeted by this assay, and inadequate number of viral copies(<138 copies/mL). A negative result must be combined with clinical observations, patient history, and epidemiological information. The expected result is Negative.  Fact Sheet for Patients:   EntrepreneurPulse.com.au  Fact Sheet for Healthcare Providers:  IncredibleEmployment.be  This test is no t yet approved or cleared by the [place] FDA and  has been authorized for detection and/or diagnosis of SARS-CoV-2 by FDA under an Emergency Use Authorization (EUA). This EUA will remain  in effect (meaning this test can be used) for the duration of the COVID-19 declaration under Section 564(b)(1) of the Act, 21 U.S.C.section 360bbb-3(b)(1), unless the authorization is terminated  or revoked sooner.       Influenza A by PCR NEGATIVE NEGATIVE Final   Influenza B by PCR NEGATIVE NEGATIVE Final    Comment: (NOTE) The Xpert Xpress SARS-CoV-2/FLU/RSV plus assay is intended as an aid in the diagnosis of influenza from Nasopharyngeal swab specimens and should not be used as a sole basis for treatment. Nasal washings and aspirates are unacceptable for Xpert Xpress SARS-CoV-2/FLU/RSV testing.  Fact Sheet for Patients: EntrepreneurPulse.com.au  Fact Sheet for Healthcare Providers: IncredibleEmployment.be  This test is not yet approved or cleared by the [place] FDA and has been authorized for detection and/or diagnosis of SARS-CoV-2 by FDA under an Emergency Use Authorization (EUA). This EUA will remain in effect (meaning this test can be used) for the duration of the COVID-19 declaration under Section 564(b)(1) of the Act, 21 U.S.C. section 360bbb-3(b)(1), unless the authorization is terminated or revoked.  Performed at [facility], [place] [address], [place], [place] [address]   Culture, blood (routine x 2)     Status: None (Preliminary result)   Collection Time: 02/06/21  6:50 PM   Specimen: BLOOD RIGHT WRIST  Result Value Ref Range Status   Specimen Description BLOOD RIGHT WRIST  Final   Special Requests AEROBIC BOTTLE ONLY Blood Culture adequate volume  Final   Culture   Final    NO  GROWTH 3 DAYS Performed at [facility], [place],  [place] [address]    Report Status PENDING  Incomplete  Culture, blood (routine x 2)     Status: None (Preliminary result)   Collection Time: 02/06/21  7:06 PM   Specimen: BLOOD RIGHT WRIST  Result Value Ref Range Status   Specimen Description BLOOD RIGHT WRIST  Final   Special Requests AEROBIC BOTTLE ONLY Blood Culture adequate volume  Final   Culture   Final    NO GROWTH 3 DAYS Performed at [facility], [address], [place], [place] [address]    Report Status PENDING  Incomplete  Culture, blood (routine x 2)     Status: None (Preliminary result)   Collection Time: 02/08/21  9:55 PM   Specimen: BLOOD LEFT HAND  Result Value Ref Range Status   Specimen Description BLOOD LEFT HAND  Final   Special Requests   Final    BOTTLES DRAWN AEROBIC AND ANAEROBIC Blood Culture adequate volume   Culture   Final    NO GROWTH < 12 HOURS Performed at [facility], [place] [address] [place], [place] [address]    Report Status PENDING  Incomplete  Culture, blood (routine x 2)     Status: None (Preliminary result)   Collection Time: 02/08/21 10:04 PM   Specimen: BLOOD RIGHT HAND  Result Value Ref Range Status   Specimen Description BLOOD RIGHT HAND  Final   Special Requests   Final    BOTTLES DRAWN AEROBIC AND ANAEROBIC Blood Culture adequate volume   Culture   Final    NO GROWTH < 12 HOURS Performed at [facility], [place] [address], [place],  [address]    Report Status PENDING  Incomplete         Radiology Studies: No results found.    Scheduled Meds: . cyclobenzaprine  10 mg Oral TID  . diltiazem  240 mg Oral Daily  . enoxaparin (LOVENOX) injection  80 mg Subcutaneous Q12H  . gabapentin  100 mg Oral QHS  . insulin aspart  0-9 Units Subcutaneous TID WC  . memantine  5 mg Oral BID  . metoprolol tartrate  25 mg Oral BID  . mirabegron ER  25 mg Oral Daily  . pantoprazole  40 mg Oral  Daily  . warfarin  15 mg Oral ONCE-1600  . Warfarin - Pharmacist Dosing Inpatient   Does not apply q1600   Continuous Infusions:   LOS: 4 days      Debbe Odea, MD [facility] Pager: [email or web address] 02/09/2021, 3:05 PM

## 2021-02-09 NOTE — Progress Notes (Signed)
Physical Therapy Treatment Patient Details Name: Beth Miller MRN: [id number] DOB: Jan 12, 1941 Today's Date: 02/09/2021    History of Present Illness pt is an 80 y/o female admitted to the ED on 4/11 for evaluation of palpitations, LBP, diarrhea and fatigue. PMHx, AFib, CHF, DM, HTN, LBP, Rheumatic fever s/p MVR with St Jude valve, Stroke.    PT Comments    Pt received in supine, agreeable to therapy session at bed-level with encouragement, c/o fatigue after recent hallway ambulation with mobility tech. Focused instead on instruction on BLE HEP for strengthening and instruction on pressure relief/benefits of mobility as tolerated. Reviewed stair sequencing technique and will plan to initiate stair training next session per tolerance. Pt continues to benefit from PT services to progress toward functional mobility goals. Continue to recommend HHPT.   Follow Up Recommendations  Home health PT;Supervision/Assistance - 24 hour     Equipment Recommendations       Recommendations for Other Services       Precautions / Restrictions Precautions Precautions: Fall (moderate) Restrictions Weight Bearing Restrictions: No    Mobility  Bed Mobility               General bed mobility comments: pt decline to attempt due to fatigue but self-reports she has been modI with bed features this date.    Transfers                    Ambulation/Gait                 Stairs             Wheelchair Mobility    Modified Rankin (Stroke Patients Only)       Balance                                            Cognition Arousal/Alertness: Suspect due to medications (drowsy) Behavior During Therapy: WFL for tasks assessed/performed Overall Cognitive Status: Within Functional Limits for tasks assessed                                 General Comments: receptive to instruction, c/o fatigue after working with mobility tech recently but  agreeable to bed-levels session      Exercises General Exercises - Lower Extremity Ankle Circles/Pumps: AROM;Both;10 reps;Supine Quad Sets: AROM;Both;5 reps;Supine Gluteal Sets:  (verbal review) Short Arc Quad: AROM;Left;5 reps;Supine Long Arc Quad:  (verbal/visual demo) Heel Slides: AROM;Left;10 reps;Supine Hip ABduction/ADduction: AROM;Left;10 reps;Supine Straight Leg Raises: AROM;Left;10 reps;Supine Hip Flexion/Marching:  (verbal review) Other Exercises Other Exercises: HEP handout given: [email or web address] Access Code: [email or web address])    General Comments General comments (skin integrity, edema, etc.): self-limiting due to pain/fatigue and notably drowsy, HR WNL (90's bpm resting), otherwise not assessed as session remained at bed-level.      Pertinent Vitals/Pain Pain Assessment: 0-10 Pain Score: 5  Pain Location: L side over iliac crest. 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 PT Goal Formulation: With patient Time For Goal Achievement: 02/21/21 Potential to Achieve Goals: Good Progress towards PT goals: Progressing toward goals    Frequency    Min 3X/week      PT Plan Current plan remains appropriate       AM-PAC PT "6 Clicks" Mobility   Outcome Measure  Help needed turning from your back to your side while in a flat bed without using bedrails?: A Little Help needed moving from lying on your back to sitting on the side of a flat bed without using bedrails?: A Little Help needed moving to and from a bed to a chair (including a wheelchair)?: A  Little Help needed standing up from a chair using your arms (e.g., wheelchair or bedside chair)?: A Little Help needed to walk in hospital room?: A Little Help needed climbing 3-5 steps with a railing? : A Little 6 Click Score: 18    End of Session   Activity Tolerance: Patient limited by fatigue;Other (comment) (drowsy, fatigued after working with mobility tech) Patient left: with call bell/phone within reach;in bed;with bed alarm set (B heels floated) Nurse Communication: Mobility status PT Visit Diagnosis: Other abnormalities of gait and mobility (R26.89);Difficulty in walking, not elsewhere classified (R26.2)     Time: 1962-2297 PT Time Calculation (min) (ACUTE ONLY): 25 min  Charges:  $Therapeutic Exercise: 8-22 mins $Therapeutic Activity: 8-22 mins                     Ronnie Doo P., PTA Acute Rehabilitation Services Pager: [phone] Office: [place] 02/09/2021, 5:32 PM

## 2021-02-09 NOTE — Progress Notes (Signed)
PT Cancellation Note  Patient Details Name: Beth Miller MRN: [id number] DOB: [birth year]   Cancelled Treatment:    Reason Eval/Treat Not Completed: (P) Patient at procedure or test/unavailable (pt at MRI for L hip pain.) Will continue efforts after imaging results in chart per PT POC.   Kara Pacer Pantelis Elgersma 02/09/2021, 2:52 PM

## 2021-02-09 NOTE — Progress Notes (Signed)
Mobility Specialist - Progress Note   02/09/21 1509  Mobility  Activity Ambulated in hall  Level of Assistance Standby assist, set-up cues, supervision of patient - no hands on  Assistive Device Front wheel walker  Distance Ambulated (ft) 200 ft  Mobility Response Tolerated poorly  Mobility performed by Mobility specialist  $Mobility charge 1 Mobility   Pt c/o L lower back pain that radiated to her R side. Pt began to experience shooting pain down both of her legs as well as numbness in both her feet w/ distance. She sat in a chair and was rolled back to her bed. VSS. RN aware of pain.   Pricilla Handler Mobility Specialist Mobility Specialist Phone: [phone]

## 2021-02-09 NOTE — Progress Notes (Signed)
Subjective: Patient is now having high fevers now that the Tylenol has been taken away  Antibiotics:  Anti-infectives (From admission, onward)   None      Medications: Scheduled Meds: . diltiazem  240 mg Oral Daily  . enoxaparin (LOVENOX) injection  80 mg Subcutaneous Q12H  . gabapentin  100 mg Oral QHS  . insulin aspart  0-9 Units Subcutaneous TID WC  . memantine  5 mg Oral BID  . metoprolol tartrate  25 mg Oral BID  . mirabegron ER  25 mg Oral Daily  . pantoprazole  40 mg Oral Daily  . Warfarin - Pharmacist Dosing Inpatient   Does not apply q1600   Continuous Infusions: PRN Meds:.acetaminophen, cyclobenzaprine, ondansetron **OR** ondansetron (ZOFRAN) IV, oxyCODONE    Objective: Weight change: -0.576 kg  Intake/Output Summary (Last 24 hours) at 02/09/2021 1640 Last data filed at 02/09/2021 1400 Gross per 24 hour  Intake 237 ml  Output --  Net 237 ml   Blood pressure 98/72, pulse 99, temperature 98.5 F (36.9 C), temperature source Oral, resp. rate 18, height 5\' 2"  (1.575 m), weight 79.9 kg, SpO2 96 %. Temp:  [98.5 F (36.9 C)-102.3 F (39.1 C)] 98.5 F (36.9 C) (04/15 1232) Pulse Rate:  [97-102] 99 (04/15 0900) Resp:  [16-20] 18 (04/15 1232) BP: (98-117)/(57-78) 98/72 (04/15 1232) SpO2:  [92 %-98 %] 96 % (04/15 1232) Weight:  [79.9 kg] 79.9 kg (04/15 0629)  Physical Exam: Physical Exam Constitutional:      General: She is not in acute distress.    Appearance: She is well-developed. She is not diaphoretic.  HENT:     Head: Normocephalic and atraumatic.     Right Ear: External ear normal.     Left Ear: External ear normal.     Mouth/Throat:     Pharynx: No oropharyngeal exudate.  Eyes:     General: No scleral icterus.    Extraocular Movements: Extraocular movements intact.     Conjunctiva/sclera: Conjunctivae normal.  Cardiovascular:     Rate and Rhythm: Normal rate and regular rhythm.     Heart sounds: Murmur heard.  No friction rub. No  gallop.   Pulmonary:     Effort: Pulmonary effort is normal. No respiratory distress.     Breath sounds: Normal breath sounds. No wheezing or rales.  Abdominal:     General: Bowel sounds are normal. There is no distension.     Palpations: Abdomen is soft.     Tenderness: There is no abdominal tenderness. There is no rebound.  Musculoskeletal:        General: Normal range of motion.     Right hip: Normal.     Left hip: Tenderness present. No deformity, lacerations or bony tenderness. Normal range of motion.  Lymphadenopathy:     Cervical: No cervical adenopathy.  Skin:    General: Skin is warm and dry.     Coloration: Skin is not pale.     Findings: No erythema or rash.  Neurological:     General: No focal deficit present.     Mental Status: She is alert and oriented to person, place, and time.     Motor: No abnormal muscle tone.     Coordination: Coordination normal.  Psychiatric:        Mood and Affect: Mood normal.        Behavior: Behavior normal.        Thought Content: Thought content normal.  Judgment: Judgment normal.      CBC:    BMET Recent Labs    02/07/21 0413 02/08/21 0209  NA 136 136  K 3.7 4.0  CL 103 102  CO2 27 28  GLUCOSE 112* 155*  BUN 10 13  CREATININE 0.94 0.96  CALCIUM 9.3 9.3     Liver Panel  No results for input(s): PROT, ALBUMIN, AST, ALT, ALKPHOS, BILITOT, BILIDIR, IBILI in the last 72 hours.     Sedimentation Rate Recent Labs    02/08/21 1414  ESRSEDRATE 54*   C-Reactive Protein Recent Labs    02/08/21 1414  CRP 7.0*    Micro Results: Recent Results (from the past 720 hour(s))  Blood culture (routine x 2)     Status: Abnormal   Collection Time: 02/05/21  6:15 PM   Specimen: BLOOD  Result Value Ref Range Status   Specimen Description BLOOD SITE NOT SPECIFIED  Final   Special Requests   Final    BOTTLES DRAWN AEROBIC AND ANAEROBIC Blood Culture results may not be optimal due to an inadequate volume of blood  received in culture bottles   Culture  Setup Time   Final    GRAM POSITIVE COCCI IN BOTH AEROBIC AND ANAEROBIC BOTTLES CRITICAL RESULT CALLED TO, READ BACK BY AND VERIFIED WITH: PHARMD T. DANG [id number] FCP    Culture (A)  Final    STAPHYLOCOCCUS HOMINIS THE SIGNIFICANCE OF ISOLATING THIS ORGANISM FROM A SINGLE SET OF BLOOD CULTURES WHEN MULTIPLE SETS ARE DRAWN IS UNCERTAIN. PLEASE NOTIFY THE MICROBIOLOGY DEPARTMENT WITHIN ONE WEEK IF SPECIATION AND SENSITIVITIES ARE REQUIRED. Performed at [facility], [place] [address], [place], [place] [address]    Report Status 02/08/2021 FINAL  Final  Blood Culture ID Panel (Reflexed)     Status: Abnormal   Collection Time: 02/05/21  6:15 PM  Result Value Ref Range Status   Enterococcus faecalis NOT DETECTED NOT DETECTED Final   Enterococcus Faecium NOT DETECTED NOT DETECTED Final   Listeria monocytogenes NOT DETECTED NOT DETECTED Final   Staphylococcus species DETECTED (A) NOT DETECTED Final    Comment: CRITICAL RESULT CALLED TO, READ BACK BY AND VERIFIED WITH: PHARMD T. DANG [phone] [id number] FCP    Staphylococcus aureus (BCID) NOT DETECTED NOT DETECTED Final   Staphylococcus epidermidis NOT DETECTED NOT DETECTED Final   Staphylococcus lugdunensis NOT DETECTED NOT DETECTED Final   Streptococcus species NOT DETECTED NOT DETECTED Final   Streptococcus agalactiae NOT DETECTED NOT DETECTED Final   Streptococcus pneumoniae NOT DETECTED NOT DETECTED Final   Streptococcus pyogenes NOT DETECTED NOT DETECTED Final   A.calcoaceticus-baumannii NOT DETECTED NOT DETECTED Final   Bacteroides fragilis NOT DETECTED NOT DETECTED Final   Enterobacterales NOT DETECTED NOT DETECTED Final   Enterobacter cloacae complex NOT DETECTED NOT DETECTED Final   Escherichia coli NOT DETECTED NOT DETECTED Final   Klebsiella aerogenes NOT DETECTED NOT DETECTED Final   Klebsiella oxytoca NOT DETECTED NOT DETECTED Final   Klebsiella pneumoniae NOT DETECTED NOT DETECTED Final    Proteus species NOT DETECTED NOT DETECTED Final   Salmonella species NOT DETECTED NOT DETECTED Final   Serratia marcescens NOT DETECTED NOT DETECTED Final   Haemophilus influenzae NOT DETECTED NOT DETECTED Final   Neisseria meningitidis NOT DETECTED NOT DETECTED Final   Pseudomonas aeruginosa NOT DETECTED NOT DETECTED Final   Stenotrophomonas maltophilia NOT DETECTED NOT DETECTED Final   Candida albicans NOT DETECTED NOT DETECTED Final   Candida auris NOT DETECTED NOT DETECTED Final  Candida glabrata NOT DETECTED NOT DETECTED Final   Candida krusei NOT DETECTED NOT DETECTED Final   Candida parapsilosis NOT DETECTED NOT DETECTED Final   Candida tropicalis NOT DETECTED NOT DETECTED Final   Cryptococcus neoformans/gattii NOT DETECTED NOT DETECTED Final    Comment: Performed at [facility], [place] [address], [place], [place] [address]  Blood culture (routine x 2)     Status: None (Preliminary result)   Collection Time: 02/05/21  7:17 PM   Specimen: BLOOD  Result Value Ref Range Status   Specimen Description BLOOD SITE NOT SPECIFIED  Final   Special Requests   Final    BOTTLES DRAWN AEROBIC AND ANAEROBIC Blood Culture adequate volume   Culture   Final    NO GROWTH 4 DAYS Performed at [facility], [address], [place], [place] [address]    Report Status PENDING  Incomplete  Resp Panel by RT-PCR (Flu A&B, Covid) Nasopharyngeal Swab     Status: None   Collection Time: 02/05/21 10:30 PM   Specimen: Nasopharyngeal Swab; Nasopharyngeal(NP) swabs in vial transport medium  Result Value Ref Range Status   SARS Coronavirus 2 by RT PCR NEGATIVE NEGATIVE Final    Comment: (NOTE) SARS-CoV-2 target nucleic acids are NOT DETECTED.  The SARS-CoV-2 RNA is generally detectable in upper respiratory specimens during the acute phase of infection. The lowest concentration of SARS-CoV-2 viral copies this assay can detect is 138 copies/mL. A negative result does not preclude  SARS-Cov-2 infection and should not be used as the sole basis for treatment or other patient management decisions. A negative result may occur with  improper specimen collection/handling, submission of specimen other than nasopharyngeal swab, presence of viral mutation(s) within the areas targeted by this assay, and inadequate number of viral copies(<138 copies/mL). A negative result must be combined with clinical observations, patient history, and epidemiological information. The expected result is Negative.  Fact Sheet for Patients:  EntrepreneurPulse.com.au  Fact Sheet for Healthcare Providers:  IncredibleEmployment.be  This test is no t yet approved or cleared by the [place] FDA and  has been authorized for detection and/or diagnosis of SARS-CoV-2 by FDA under an Emergency Use Authorization (EUA). This EUA will remain  in effect (meaning this test can be used) for the duration of the COVID-19 declaration under Section 564(b)(1) of the Act, 21 U.S.C.section 360bbb-3(b)(1), unless the authorization is terminated  or revoked sooner.       Influenza A by PCR NEGATIVE NEGATIVE Final   Influenza B by PCR NEGATIVE NEGATIVE Final    Comment: (NOTE) The Xpert Xpress SARS-CoV-2/FLU/RSV plus assay is intended as an aid in the diagnosis of influenza from Nasopharyngeal swab specimens and should not be used as a sole basis for treatment. Nasal washings and aspirates are unacceptable for Xpert Xpress SARS-CoV-2/FLU/RSV testing.  Fact Sheet for Patients: EntrepreneurPulse.com.au  Fact Sheet for Healthcare Providers: IncredibleEmployment.be  This test is not yet approved or cleared by the [place] FDA and has been authorized for detection and/or diagnosis of SARS-CoV-2 by FDA under an Emergency Use Authorization (EUA). This EUA will remain in effect (meaning this test can be used) for the duration of  the COVID-19 declaration under Section 564(b)(1) of the Act, 21 U.S.C. section 360bbb-3(b)(1), unless the authorization is terminated or revoked.  Performed at [facility], [place] [address] [place],  [address]   Culture, blood (routine x 2)     Status: None (Preliminary result)   Collection Time: 02/06/21  6:50 PM   Specimen: BLOOD RIGHT WRIST  Result Value Ref Range Status   Specimen Description BLOOD RIGHT WRIST  Final   Special Requests AEROBIC BOTTLE ONLY Blood Culture adequate volume  Final   Culture   Final    NO GROWTH 3 DAYS Performed at [facility], [address] [place], [place] [address]    Report Status PENDING  Incomplete  Culture, blood (routine x 2)     Status: None (Preliminary result)   Collection Time: 02/06/21  7:06 PM   Specimen: BLOOD RIGHT WRIST  Result Value Ref Range Status   Specimen Description BLOOD RIGHT WRIST  Final   Special Requests AEROBIC BOTTLE ONLY Blood Culture adequate volume  Final   Culture   Final    NO GROWTH 3 DAYS Performed at [facility], [address], [place], [place] [address]    Report Status PENDING  Incomplete  Culture, blood (routine x 2)     Status: None (Preliminary result)   Collection Time: 02/08/21  9:55 PM   Specimen: BLOOD LEFT HAND  Result Value Ref Range Status   Specimen Description BLOOD LEFT HAND  Final   Special Requests   Final    BOTTLES DRAWN AEROBIC AND ANAEROBIC Blood Culture adequate volume   Culture   Final    NO GROWTH < 12 HOURS Performed at [facility], [place] [address], [place],  [address]    Report Status PENDING  Incomplete  Culture, blood (routine x 2)     Status: None (Preliminary result)   Collection Time: 02/08/21 10:04 PM   Specimen: BLOOD RIGHT HAND  Result Value Ref Range Status   Specimen Description BLOOD RIGHT HAND  Final   Special Requests   Final    BOTTLES DRAWN AEROBIC AND ANAEROBIC Blood Culture adequate volume   Culture   Final    NO  GROWTH < 12 HOURS Performed at [facility], [place] [address], [place],  [address]    Report Status PENDING  Incomplete    Studies/Results: MR HIP LEFT W WO CONTRAST  Result Date: 02/09/2021 CLINICAL DATA:  Left hip and gluteal pain. Fever. Question osteomyelitis. EXAM: MRI OF THE LEFT HIP WITHOUT AND WITH CONTRAST TECHNIQUE: Multiplanar, multisequence MR imaging was performed both before and after administration of intravenous contrast. CONTRAST:  7.5 mL GADAVIST IV SOLN COMPARISON:  CT abdomen and pelvis 02/05/2021 in 12/20/2016. FINDINGS: Bones: There is no marrow edema or enhancement to suggest osteomyelitis. No fracture, stress change or worrisome lesion. Markedly T1 and T2 hypointense lesion in the right acetabulum is consistent with a bone island and stable since 2018. No avascular necrosis of the femoral heads. No subchondral cyst formation or edema about the hips. Articular cartilage and labrum Articular cartilage:  Mildly thinned without focal defect. Labrum:  The superior labrum is degenerated and frayed. Joint or bursal effusion Joint effusion:  Small left hip joint effusion. Bursae: Negative. Muscles and tendons Muscles and tendons:  Intact. Other findings Miscellaneous: Imaged intrapelvic contents demonstrate no acute or focal abnormality. The patient is status post hysterectomy. IMPRESSION: Negative for osteomyelitis or acute bony abnormality. Small left hip joint effusion is likely related to degenerative change although it cannot be definitively characterized. Electronically Signed   By: Inge Rise M.D.   On: 02/09/2021 15:18      Assessment/Plan:  INTERVAL HISTORY:   Patient has had fevers again much higher now with Tylenol not being present.  Principal Problem:   Atrial fibrillation with RVR (HCC) Active Problems:   DM2 (diabetes mellitus, type 2) (HCC)   SIRS (systemic inflammatory response syndrome) (HCC)   Chronic kidney disease, stage 3a (HCC)    Diarrhea   Chronic diastolic CHF (congestive heart failure) (HCC)    Beth Miller is a 80 y.o. female with past medical history significant for TAVR, atrial fibrillation with rapid ventricular response diabetes mellitus diastolic heart failure who was admitted with fever and loose stools.  CT abdomen pelvis was unrevealing C. difficile testing was negative.  Blood cultures revealed a Staph hominis in 1 of 2 sites.  She has continued to had fevers now much higher with Tylenol not having been given today.  #1 fever unknown origin:  We obtained an MRI of the hip due to her pain at that site but it is unrevealing other than a small effusion  CT chest has been ordered  Check TTE  Ordered additional serologies for FUO work-up  #2 Staph hominis in blood culture 1/2 sites = contaminant  I will ask Dr. [place] Bali to check in on the patient this weekend and I will be back on Monday.   LOS: 4 days   Alcide Evener 02/09/2021, 4:40 PM

## 2021-02-09 NOTE — Progress Notes (Signed)
[place] for Warfarin/lovenox Indication: Afib/MVR  Allergies  Allergen Reactions  . Aricept [Donepezil Hcl]     Intolerance - bad dreams  . Food     Bananas and Fish, cause sinus drainage  . Furosemide     cramps  . Quinine Other (See Comments)    Can't hear  . Spironolactone     constipation  . Tramadol     headache  . Sulfadiazine Itching and Rash    Patient Measurements: Height: 5\' 2"  (157.5 cm) Weight: 79.9 kg (176 lb 3.2 oz) IBW/kg (Calculated) : 50.1  Vital Signs: Temp: 98.5 F (36.9 C) (04/15 1232) Temp Source: Oral (04/15 1232) BP: 98/72 (04/15 1232) Pulse Rate: 99 (04/15 0900)  Labs: Recent Labs    02/07/21 0413 02/08/21 0209 02/09/21 0250  HGB 9.9* 9.6*  --   HCT 29.1* 28.3*  --   PLT 230 240  --   LABPROT 23.3* 26.4* 25.4*  INR 2.1* 2.4* 2.3*  CREATININE 0.94 0.96  --     Estimated Creatinine Clearance: 45 mL/min (by C-G formula based on SCr of 0.96 mg/dL).   Medical History: Past Medical History:  Diagnosis Date  . AF (atrial fibrillation) ([place])   . ALLERGIC RHINITIS   . Blood transfusion without reported diagnosis   . Cataract, senile   . Chest pain   . CHF (congestive heart failure) ([place])   . Chronic anticoagulation   . Diabetes mellitus   . Diabetes mellitus   . Disorder of oral soft tissue    of mouth  . Diverticulosis   . GERD (gastroesophageal reflux disease)   . Hematoma   . Hypertension   . Leg pain   . Low back pain   . Paresthesia   . Rheumatic fever   . Status post mitral valve replacement    St. Jude valve  . Stroke ([place])    tia  . Sweating   . Vitamin D deficiency      Assessment: 80 y/o F presents to the ED with palpitations, low back pain, and diarrhea. On warfarin PTA for Afib and MVR.  (goal 2.5-3.5). Last outpatient INR was 2.1 on 3/21. Lovenox added 4/12 -INR= 2.3  Outpatient warfarin dosing per recent anti-coag notes: 15 mg on Mon/Fri and 10 mg all other  days  Goal of Therapy:  INR 2.5-3.5 per outpatient anti-coag notes Monitor platelets by anticoagulation protocol: Yes   Plan:  Warfarin 15 mg PO x 1 Daily PT/INR   Hildred Laser, PharmD Clinical Pharmacist **Pharmacist phone directory can now be found on [email or web address] ([email or web address]).  Listed under [place].

## 2021-02-10 ENCOUNTER — Inpatient Hospital Stay (HOSPITAL_COMMUNITY): Payer: Medicare Other

## 2021-02-10 DIAGNOSIS — M549 Dorsalgia, unspecified: Secondary | ICD-10-CM

## 2021-02-10 DIAGNOSIS — R7881 Bacteremia: Secondary | ICD-10-CM | POA: Diagnosis not present

## 2021-02-10 DIAGNOSIS — B957 Other staphylococcus as the cause of diseases classified elsewhere: Secondary | ICD-10-CM

## 2021-02-10 DIAGNOSIS — R509 Fever, unspecified: Secondary | ICD-10-CM | POA: Diagnosis not present

## 2021-02-10 DIAGNOSIS — Z954 Presence of other heart-valve replacement: Secondary | ICD-10-CM

## 2021-02-10 DIAGNOSIS — Z952 Presence of prosthetic heart valve: Secondary | ICD-10-CM

## 2021-02-10 DIAGNOSIS — R911 Solitary pulmonary nodule: Secondary | ICD-10-CM

## 2021-02-10 LAB — ECHOCARDIOGRAM COMPLETE
AR max vel: 1.33 cm2
AV Area VTI: 1.38 cm2
AV Area mean vel: 1.29 cm2
AV Mean grad: 5.4 mmHg
AV Peak grad: 9.4 mmHg
Ao pk vel: 1.53 m/s
Calc EF: 37.5 %
Height: 62 in
S' Lateral: 2.7 cm
Single Plane A2C EF: 37 %
Single Plane A4C EF: 39.3 %
Weight: 2819.21 oz

## 2021-02-10 LAB — CULTURE, BLOOD (ROUTINE X 2)
Culture: NO GROWTH
Special Requests: ADEQUATE

## 2021-02-10 LAB — CK: Total CK: 37 U/L — ABNORMAL LOW (ref 38–234)

## 2021-02-10 LAB — GLUCOSE, CAPILLARY
Glucose-Capillary: 130 mg/dL — ABNORMAL HIGH (ref 70–99)
Glucose-Capillary: 174 mg/dL — ABNORMAL HIGH (ref 70–99)
Glucose-Capillary: 127 mg/dL — ABNORMAL HIGH (ref 70–99)
Glucose-Capillary: 119 mg/dL — ABNORMAL HIGH (ref 70–99)

## 2021-02-10 LAB — FERRITIN: Ferritin: 80 ng/mL (ref 11–307)

## 2021-02-10 LAB — RPR: RPR Ser Ql: NONREACTIVE

## 2021-02-10 LAB — LACTATE DEHYDROGENASE: LDH: 208 U/L — ABNORMAL HIGH (ref 98–192)

## 2021-02-10 LAB — PROTIME-INR
INR: 2.3 — ABNORMAL HIGH (ref 0.8–1.2)
Prothrombin Time: 25.5 seconds — ABNORMAL HIGH (ref 11.4–15.2)

## 2021-02-10 MED ORDER — WARFARIN SODIUM 2.5 MG PO TABS
12.5000 mg | ORAL_TABLET | Freq: Once | ORAL | Status: AC
  Administered 2021-02-10: 12.5 mg via ORAL
  Filled 2021-02-10: qty 1

## 2021-02-10 MED ORDER — PERFLUTREN LIPID MICROSPHERE
1.0000 mL | INTRAVENOUS | Status: AC | PRN
Start: 2021-02-10 — End: 2021-02-10
  Administered 2021-02-10: 4 mL via INTRAVENOUS
  Filled 2021-02-10: qty 10

## 2021-02-10 MED ORDER — IOHEXOL 300 MG/ML  SOLN
100.0000 mL | Freq: Once | INTRAMUSCULAR | Status: AC | PRN
  Administered 2021-02-10: 100 mL via INTRAVENOUS

## 2021-02-10 NOTE — Progress Notes (Signed)
[facility] Infectious Diseases Follow Up Note  Patient Identification: Patient Name: Beth Miller MRN: [id number] [place] Date: 02/05/2021  6:05 PM Age: 80 y.o.Today's Date: 02/10/2021   Reason for Visit: Follow-up on staph hominis bacteremia  Principal Problem:   Atrial fibrillation with RVR ([place]) Active Problems:   DM2 (diabetes mellitus, type 2) (HCC)   SIRS (systemic inflammatory response syndrome) (HCC)   Chronic kidney disease, stage 3a (HCC)   Diarrhea   Chronic diastolic CHF (congestive heart failure) (HCC)   Antibiotics: None  Lines/Tubes: PIV's  Interval Events: Afebrile in the last 24 hours, no leukocytosis.  Repeat blood cultures 4/12 no growth in 3 days   Assessment Staph hominis in blood cultures- Only one set positve, likely a contaminant  History of TAVR (MV) Fevers- afebrile in the last 24 hrs off of antibiotics  CT chest abd/pelvis - Multiple sites of focal soft tissue stranding and punctate foci of soft tissue gas along the anterior abdominal wall likely related to injectable use. Back pain - no vertebral tenderness, no neurological deficits, CT abd/pelvis with degenerative changes, monitor for now    Recommendations Follow-up TTE Follow-up repeat blood cultures to make sure there is no persistent growth  LDH slightly elevated .  Ferritin within normal limit.  Follow-up rheumatoid factor CMV IgM, CMV IgG, cryoglobulins, ACE and RPR Monitor OFF antibiotics   Rest of the management as per the primary team. Thank you for the consult. Please page with pertinent questions or concerns.  ______________________________________________________________________ Subjective patient seen and examined at the bedside. Complains of back pain since Monday which has gone down from 30 to 6 wuth analgesics. Denies any numbness/weakness/tingling, incontinence. Denies nausea/vomiting and diarrhea  Vitals BP 111/72 (BP  Location: Left Arm)   Pulse 96   Temp 99.1 F (37.3 C) (Oral)   Resp 15   Ht 5\' 2"  (1.575 m)   Wt 79.9 kg   LMP  (LMP Unknown)   SpO2 92%   BMI 32.23 kg/m   Physical Exam Constitutional:  lying in bed, no acute distress    Comments:   Cardiovascular:     Rate and Rhythm: Normal rate and regular rhythm.     Heart sounds: click + Pulmonary:     Effort: Pulmonary effort is normal.     Comments:   Abdominal:     Palpations: Abdomen is soft.     Tenderness:   Musculoskeletal:        General: No swelling or tenderness. No vertebral tenderness  Skin:    Comments: no obvious lesions or rashes   Neurological:     General: No focal deficit present.   Psychiatric:        Mood and Affect: Mood normal.    Pertinent Microbiology Results for orders placed or performed during the hospital encounter of 02/05/21  Blood culture (routine x 2)     Status: Abnormal   Collection Time: 02/05/21  6:15 PM   Specimen: BLOOD  Result Value Ref Range Status   Specimen Description BLOOD SITE NOT SPECIFIED  Final   Special Requests   Final    BOTTLES DRAWN AEROBIC AND ANAEROBIC Blood Culture results may not be optimal due to an inadequate volume of blood received in culture bottles   Culture  Setup Time   Final    GRAM POSITIVE COCCI IN BOTH AEROBIC AND ANAEROBIC BOTTLES CRITICAL RESULT CALLED TO, READ BACK BY AND VERIFIED WITH: PHARMD T. DANG [id number] FCP    Culture (A)  Final    STAPHYLOCOCCUS HOMINIS THE SIGNIFICANCE OF ISOLATING THIS ORGANISM FROM A SINGLE SET OF BLOOD CULTURES WHEN MULTIPLE SETS ARE DRAWN IS UNCERTAIN. PLEASE NOTIFY THE MICROBIOLOGY DEPARTMENT WITHIN ONE WEEK IF SPECIATION AND SENSITIVITIES ARE REQUIRED. Performed at [facility], [place] [address], [place], [place] [address]    Report Status 02/08/2021 FINAL  Final  Blood Culture ID Panel (Reflexed)     Status: Abnormal   Collection Time: 02/05/21  6:15 PM  Result Value Ref Range Status   Enterococcus  faecalis NOT DETECTED NOT DETECTED Final   Enterococcus Faecium NOT DETECTED NOT DETECTED Final   Listeria monocytogenes NOT DETECTED NOT DETECTED Final   Staphylococcus species DETECTED (A) NOT DETECTED Final    Comment: CRITICAL RESULT CALLED TO, READ BACK BY AND VERIFIED WITH: PHARMD T. DANG [id number] FCP    Staphylococcus aureus (BCID) NOT DETECTED NOT DETECTED Final   Staphylococcus epidermidis NOT DETECTED NOT DETECTED Final   Staphylococcus lugdunensis NOT DETECTED NOT DETECTED Final   Streptococcus species NOT DETECTED NOT DETECTED Final   Streptococcus agalactiae NOT DETECTED NOT DETECTED Final   Streptococcus pneumoniae NOT DETECTED NOT DETECTED Final   Streptococcus pyogenes NOT DETECTED NOT DETECTED Final   A.calcoaceticus-baumannii NOT DETECTED NOT DETECTED Final   Bacteroides fragilis NOT DETECTED NOT DETECTED Final   Enterobacterales NOT DETECTED NOT DETECTED Final   Enterobacter cloacae complex NOT DETECTED NOT DETECTED Final   Escherichia coli NOT DETECTED NOT DETECTED Final   Klebsiella aerogenes NOT DETECTED NOT DETECTED Final   Klebsiella oxytoca NOT DETECTED NOT DETECTED Final   Klebsiella pneumoniae NOT DETECTED NOT DETECTED Final   Proteus species NOT DETECTED NOT DETECTED Final   Salmonella species NOT DETECTED NOT DETECTED Final   Serratia marcescens NOT DETECTED NOT DETECTED Final   Haemophilus influenzae NOT DETECTED NOT DETECTED Final   Neisseria meningitidis NOT DETECTED NOT DETECTED Final   Pseudomonas aeruginosa NOT DETECTED NOT DETECTED Final   Stenotrophomonas maltophilia NOT DETECTED NOT DETECTED Final   Candida albicans NOT DETECTED NOT DETECTED Final   Candida auris NOT DETECTED NOT DETECTED Final   Candida glabrata NOT DETECTED NOT DETECTED Final   Candida krusei NOT DETECTED NOT DETECTED Final   Candida parapsilosis NOT DETECTED NOT DETECTED Final   Candida tropicalis NOT DETECTED NOT DETECTED Final   Cryptococcus neoformans/gattii NOT  DETECTED NOT DETECTED Final    Comment: Performed at [facility], [place] [address], [place], [place] [address]  Blood culture (routine x 2)     Status: None (Preliminary result)   Collection Time: 02/05/21  7:17 PM   Specimen: BLOOD  Result Value Ref Range Status   Specimen Description BLOOD SITE NOT SPECIFIED  Final   Special Requests   Final    BOTTLES DRAWN AEROBIC AND ANAEROBIC Blood Culture adequate volume   Culture   Final    NO GROWTH 4 DAYS Performed at [facility], [address] [place], [place] [address]    Report Status PENDING  Incomplete  Resp Panel by RT-PCR (Flu A&B, Covid) Nasopharyngeal Swab     Status: None   Collection Time: 02/05/21 10:30 PM   Specimen: Nasopharyngeal Swab; Nasopharyngeal(NP) swabs in vial transport medium  Result Value Ref Range Status   SARS Coronavirus 2 by RT PCR NEGATIVE NEGATIVE Final    Comment: (NOTE) SARS-CoV-2 target nucleic acids are NOT DETECTED.  The SARS-CoV-2 RNA is generally detectable in upper respiratory specimens during the acute phase of infection.  The lowest concentration of SARS-CoV-2 viral copies this assay can detect is 138 copies/mL. A negative result does not preclude SARS-Cov-2 infection and should not be used as the sole basis for treatment or other patient management decisions. A negative result may occur with  improper specimen collection/handling, submission of specimen other than nasopharyngeal swab, presence of viral mutation(s) within the areas targeted by this assay, and inadequate number of viral copies(<138 copies/mL). A negative result must be combined with clinical observations, patient history, and epidemiological information. The expected result is Negative.  Fact Sheet for Patients:  EntrepreneurPulse.com.au  Fact Sheet for Healthcare Providers:  IncredibleEmployment.be  This test is no t yet approved or cleared by the [place] FDA and  has  been authorized for detection and/or diagnosis of SARS-CoV-2 by FDA under an Emergency Use Authorization (EUA). This EUA will remain  in effect (meaning this test can be used) for the duration of the COVID-19 declaration under Section 564(b)(1) of the Act, 21 U.S.C.section 360bbb-3(b)(1), unless the authorization is terminated  or revoked sooner.       Influenza A by PCR NEGATIVE NEGATIVE Final   Influenza B by PCR NEGATIVE NEGATIVE Final    Comment: (NOTE) The Xpert Xpress SARS-CoV-2/FLU/RSV plus assay is intended as an aid in the diagnosis of influenza from Nasopharyngeal swab specimens and should not be used as a sole basis for treatment. Nasal washings and aspirates are unacceptable for Xpert Xpress SARS-CoV-2/FLU/RSV testing.  Fact Sheet for Patients: EntrepreneurPulse.com.au  Fact Sheet for Healthcare Providers: IncredibleEmployment.be  This test is not yet approved or cleared by the [place] FDA and has been authorized for detection and/or diagnosis of SARS-CoV-2 by FDA under an Emergency Use Authorization (EUA). This EUA will remain in effect (meaning this test can be used) for the duration of the COVID-19 declaration under Section 564(b)(1) of the Act, 21 U.S.C. section 360bbb-3(b)(1), unless the authorization is terminated or revoked.  Performed at [facility], [place] [address] [place], [place] [address]   Culture, blood (routine x 2)     Status: None (Preliminary result)   Collection Time: 02/06/21  6:50 PM   Specimen: BLOOD RIGHT WRIST  Result Value Ref Range Status   Specimen Description BLOOD RIGHT WRIST  Final   Special Requests AEROBIC BOTTLE ONLY Blood Culture adequate volume  Final   Culture   Final    NO GROWTH 3 DAYS Performed at [facility], [place] [address], [place], [place] [address]    Report Status PENDING  Incomplete  Culture, blood (routine x 2)     Status: None (Preliminary result)    Collection Time: 02/06/21  7:06 PM   Specimen: BLOOD RIGHT WRIST  Result Value Ref Range Status   Specimen Description BLOOD RIGHT WRIST  Final   Special Requests AEROBIC BOTTLE ONLY Blood Culture adequate volume  Final   Culture   Final    NO GROWTH 3 DAYS Performed at [facility], [address] [place], [place] [address]    Report Status PENDING  Incomplete  Culture, blood (routine x 2)     Status: None (Preliminary result)   Collection Time: 02/08/21  9:55 PM   Specimen: BLOOD LEFT HAND  Result Value Ref Range Status   Specimen Description BLOOD LEFT HAND  Final   Special Requests   Final    BOTTLES DRAWN AEROBIC AND ANAEROBIC Blood Culture adequate volume   Culture   Final    NO GROWTH <  12 HOURS Performed at [facility], [place] [address], [place], [place] [address]    Report Status PENDING  Incomplete  Culture, blood (routine x 2)     Status: None (Preliminary result)   Collection Time: 02/08/21 10:04 PM   Specimen: BLOOD RIGHT HAND  Result Value Ref Range Status   Specimen Description BLOOD RIGHT HAND  Final   Special Requests   Final    BOTTLES DRAWN AEROBIC AND ANAEROBIC Blood Culture adequate volume   Culture  Setup Time   Final    AEROBIC BOTTLE ONLY GRAM POSITIVE COCCI CRITICAL VALUE NOTED.  VALUE IS CONSISTENT WITH PREVIOUSLY REPORTED AND CALLED VALUE.    Culture   Final    NO GROWTH < 12 HOURS Performed at [facility], [place] [address] [place], [place] [address]    Report Status PENDING  Incomplete    Pertinent Lab. CBC Latest Ref Rng & Units 02/08/2021 02/07/2021 02/06/2021  WBC 4.0 - 10.5 K/uL 7.3 6.8 6.9  Hemoglobin 12.0 - 15.0 g/dL 9.6(L) 9.9(L) 10.0(L)  Hematocrit 36.0 - 46.0 % 28.3(L) 29.1(L) 29.2(L)  Platelets 150 - 400 K/uL 240 230 238   CMP Latest Ref Rng & Units 02/08/2021 02/07/2021 02/06/2021  Glucose 70 - 99 mg/dL 155(H) 112(H) 113(H)  BUN 8 - 23 mg/dL 13 10 13   Creatinine 0.44 - 1.00 mg/dL 0.96 0.94 0.90  Sodium 135 - 145  mmol/L 136 136 139  Potassium 3.5 - 5.1 mmol/L 4.0 3.7 3.8  Chloride 98 - 111 mmol/L 102 103 103  CO2 22 - 32 mmol/L 28 27 27   Calcium 8.9 - 10.3 mg/dL 9.3 9.3 9.5  Total Protein 6.5 - 8.1 g/dL - - -  Total Bilirubin 0.3 - 1.2 mg/dL - - -  Alkaline Phos 38 - 126 U/L - - -  AST 15 - 41 U/L - - -  ALT 0 - 44 U/L - - -     Pertinent Imaging today Plain films and CT images have been personally visualized and interpreted; radiology reports have been reviewed. Decision making incorporated into the Impression / Recommendations.  CT chest abdomen pelvis 02/10/21 IMPRESSION: 1. Low volumes and atelectasis with mild airways thickening and scattered secretions. Correlate for features of bronchitis. 2. No other acute intrathoracic process. 3. Few scattered sub 5 mm solid and subsolid nodules in the left upper lobe, nonspecific no can be post infectious or inflammatory. No follow-up needed if patient is low-risk (and has no known or suspected primary neoplasm). Non-contrast chest CT can be considered in 12 months if patient is high-risk. This recommendation follows the consensus statement: Guidelines for Management of Incidental Pulmonary Nodules Detected on CT Images: From the Fleischner Society 2017; Radiology 2017; 284:228-243. 4. Small upper abdominal fat containing ventral hernia anterior to the falciform ligament with small amount of fluid in the hernia sac, unchanged from prior. No bowel containing hernia. 5. Multiple sites of focal soft tissue stranding and punctate foci of soft tissue gas along the anterior abdominal wall likely related to injectable use. Correlate with visual inspection. 6. No other acute or conspicuous abdominopelvic abnormalities provide cause for patient's symptoms. 7. Prior mitral valvuloplasty. 8. Aortic Atherosclerosis (ICD10-I70.0).  I have spent approx 30 minutes for this patient encounter including review of prior medical records, coordination of care   with greater than 50% of time being face to face/counseling and discussing diagnostics/treatment plan with the patient/family.  Electronically signed by:   Rosiland Oz, MD Infectious Disease Physician  [facility] for Infectious Disease Pager: [phone]

## 2021-02-10 NOTE — Progress Notes (Addendum)
[place] for Warfarin/lovenox Indication: Afib/MVR  Allergies  Allergen Reactions  . Aricept [Donepezil Hcl]     Intolerance - bad dreams  . Food     Bananas and Fish, cause sinus drainage  . Furosemide     cramps  . Quinine Other (See Comments)    Can't hear  . Spironolactone     constipation  . Tramadol     headache  . Sulfadiazine Itching and Rash    Patient Measurements: Height: 5\' 2"  (157.5 cm) Weight: 79.9 kg (176 lb 3.2 oz) IBW/kg (Calculated) : 50.1  Vital Signs: Temp: 99.3 F (37.4 C) (04/16 0304) Temp Source: Oral (04/16 0304) BP: 108/67 (04/16 0304) Pulse Rate: 79 (04/16 0304)  Labs: Recent Labs    02/08/21 0209 02/09/21 0250 02/10/21 0120  HGB 9.6*  --   --   HCT 28.3*  --   --   PLT 240  --   --   LABPROT 26.4* 25.4* 25.5*  INR 2.4* 2.3* 2.3*  CREATININE 0.96  --   --   CKTOTAL  --   --  37*    Estimated Creatinine Clearance: 45 mL/min (by C-G formula based on SCr of 0.96 mg/dL).   Medical History: Past Medical History:  Diagnosis Date  . AF (atrial fibrillation) ([place])   . ALLERGIC RHINITIS   . Blood transfusion without reported diagnosis   . Cataract, senile   . Chest pain   . CHF (congestive heart failure) ([place])   . Chronic anticoagulation   . Diabetes mellitus   . Diabetes mellitus   . Disorder of oral soft tissue    of mouth  . Diverticulosis   . GERD (gastroesophageal reflux disease)   . Hematoma   . Hypertension   . Leg pain   . Low back pain   . Paresthesia   . Rheumatic fever   . Status post mitral valve replacement    St. Jude valve  . Stroke ([place])    tia  . Sweating   . Vitamin D deficiency      Assessment: 80 y/o F presents to the ED with palpitations, low back pain, and diarrhea. On warfarin PTA for Afib and MVR (INR goal 2.5-3.5). INR on admission 2.0. Pharmacy has been consulted to bridge warfarin with Lovenox.   Outpatient warfarin dosing per recent anti-coag notes:  15 mg on Mon/Fri and 10 mg all other days. Last outpatient INR was 2.1 on 3/21.  INR is still slightly subtherapeutic at 2.3 on home regimen. No over bleeding noted.  Goal of Therapy:  INR 2.5-3.5 per outpatient anti-coag notes Monitor platelets by anticoagulation protocol: Yes   Plan:  Warfarin 12.5 mg PO x 1 Enoxaparin 80 mg (1 mg/kg) SQ every 12 hours  Daily PT/INR   Romilda Garret, PharmD PGY1 Acute Care Pharmacy Resident 02/10/2021 7:33 AM  Please check [email or web address] for unit specific pharmacy phone numbers.

## 2021-02-10 NOTE — Progress Notes (Signed)
  Echocardiogram 2D Echocardiogram has been performed with [facility].  Beth Miller 02/10/2021, 4:28 PM

## 2021-02-10 NOTE — Progress Notes (Signed)
PROGRESS NOTE    Beth Miller   NIO:[id number]  DOB: [birth year]  DOA: 02/05/2021 PCP: Cassandria Anger, MD   Brief Narrative:  Beth Miller is an 80 year old female with mechanical mitral valve on Coumadin, chronic diastolic heart failure, type 2 diabetes mellitus, hypertension who presented to the hospital with abdominal pain diarrhea occurring on Sunday.  She said she had 4 episodes of watery diarrhea with abdominal pain and fatigue.  ED: Temperature 101.4, tachycardic, blood pressure 109/95, respiratory rate in 20s.  Cardiac rhythm was atrial fibrillation.   She was started on IV Cardizem infusion after bolus IV fluids. No antibiotics given. Later complained of left buttock pain with movement.  Subjective: No new complaints.    Assessment & Plan:   Principal Problem: Abdominal pain, diarrhea with fever, tachycardia, tachypnea -sepsis POA - ongoing fevers -abdominal pain and diarrhea resolved - he states she did not eat anything out of the ordinary or take any laxatives - tachycardia being controlled with meds (A fib) - 4/14>100.1- this is despite taking TID Acetaminophen and Hydrocodone/acetominophen- stop acetaminophen and cont to follow temps closely - 4/15> fever overnight 101.6 - she has no respiratory symptoms, no GI symptoms, no dental pain, no joint pain or rash, no headache - she has urinary incontinence and repeat UA is negative - CT abd/pelvis on admission unrevealing - procalcitonin < 0.10 - HIV non reactive - Hepatitis panel negative -  Gram stain from last set of blood cultures showing gram + cocci - Left hip MRI neg-2 D ECHO negative -  temps are 99 today - I am awaiting ANA results  Active Problems:    Atrial fibrillation with RVR  -Off Cardizem infusion and on oral Cardizem now at a dose of 240 mg - 4/14>  added metoprolol - HR improved- follow BP closely  1 set of blood cultures positive for staph hominis -Repeat blood cultures are  Negative -  ID feels it is a contaminant  Left gluteal pain -On exam on 4/13, she had in pain when I squeeze her upper outer gluteal area-I believe this is musculoskeletal-my colleague has started Flexeril 3 times daily and a lidocaine patch which can be continued - I have asked her to avoid activities that exacerbate the pain - she can have PRN Oxycodone for severe pain  - she states it is improving and she is able to move around better    DM2 (diabetes mellitus, type 2)  -Continue sliding scale insulin - A1c 6.8 on 4/5   Chemical mitral valve with subtherapeutic INR -INR is still 2.3 today-continue Lovenox and Coumadin per pharmacy - she also has hematuria which is likely related to this mechanical valve  Time spent in minutes: 35 DVT prophylaxis: Lovenox Code Status: Full code Family Communication:  Level of Care: Level of care: Progressive- transition to telemetry Disposition Plan:  Status is: Inpatient  Remains inpatient appropriate because:Inpatient level of care appropriate due to severity of illness- ongoing fevers w/o a source   Dispo: The patient is from: Home              Anticipated d/c is to: Home              Patient currently is not medically stable to d/c.   Difficult to place patient No      Consultants:   ID Procedures:   none Antimicrobials:  Anti-infectives (From admission, onward)   None       Objective: Vitals:   02/10/21  0277 02/10/21 0755 02/10/21 1204 02/10/21 1652  BP:  111/72 107/65 113/80  Pulse:  96 95 94  Resp:  15 16 17   Temp:  99.1 F (37.3 C) 99 F (37.2 C) 99.3 F (37.4 C)  TempSrc:  Oral Oral Oral  SpO2:  92% 92% 92%  Weight: 79.9 kg     Height:        Intake/Output Summary (Last 24 hours) at 02/10/2021 1846 Last data filed at 02/10/2021 1500 Gross per 24 hour  Intake 720 ml  Output --  Net 720 ml   Filed Weights   02/08/21 0500 02/09/21 0629 02/10/21 4128  Weight: 80.5 kg 79.9 kg 79.9 kg     Examination: General exam: Appears comfortable  HEENT: PERRLA, oral mucosa moist, no sclera icterus or thrush Respiratory system: Clear to auscultation. Respiratory effort normal. Cardiovascular system: S1 & S2 heard, IIRR Gastrointestinal system: Abdomen soft, non-tender, nondistended. Normal bowel sounds   Central nervous system: Alert and oriented. No focal neurological deficits. Extremities: No cyanosis, clubbing or edema Skin: No rashes or ulcers Psychiatry:  Mood & affect appropriate.     Data Reviewed: I have personally reviewed following labs and imaging studies  CBC: Recent Labs  Lab 02/05/21 1814 02/05/21 1911 02/06/21 0705 02/07/21 0413 02/08/21 0209  WBC 8.5  --  6.9 6.8 7.3  NEUTROABS 6.4  --   --   --   --   HGB 10.7* 11.2* 10.0* 9.9* 9.6*  HCT 31.4* 33.0* 29.2* 29.1* 28.3*  MCV 80.1  --  79.3* 79.7* 78.8*  PLT 261  --  238 230 786   Basic Metabolic Panel: Recent Labs  Lab 02/05/21 1814 02/05/21 1911 02/06/21 0705 02/07/21 0413 02/08/21 0209  NA 135 136 139 136 136  K 4.1 4.2 3.8 3.7 4.0  CL 102 102 103 103 102  CO2 27  --  27 27 28   GLUCOSE 152* 153* 113* 112* 155*  BUN 16 17 13 10 13   CREATININE 1.09* 1.10* 0.90 0.94 0.96  CALCIUM 9.7  --  9.5 9.3 9.3  MG  --   --  1.7  --   --    GFR: Estimated Creatinine Clearance: 45 mL/min (by C-G formula based on SCr of 0.96 mg/dL). Liver Function Tests: Recent Labs  Lab 02/05/21 1814  AST 21  ALT 13  ALKPHOS 61  BILITOT 1.0  PROT 6.9  ALBUMIN 3.7   Recent Labs  Lab 02/05/21 1814  LIPASE 47   No results for input(s): AMMONIA in the last 168 hours. Coagulation Profile: Recent Labs  Lab 02/06/21 0705 02/07/21 0413 02/08/21 0209 02/09/21 0250 02/10/21 0120  INR 1.8* 2.1* 2.4* 2.3* 2.3*   Cardiac Enzymes: Recent Labs  Lab 02/10/21 0120  CKTOTAL 37*   BNP (last 3 results) No results for input(s): PROBNP in the last 8760 hours. HbA1C: No results for input(s): HGBA1C in the last  72 hours. CBG: Recent Labs  Lab 02/09/21 1700 02/09/21 2013 02/10/21 0618 02/10/21 1208 02/10/21 1649  GLUCAP 119* 137* 130* 174* 127*   Lipid Profile: No results for input(s): CHOL, HDL, LDLCALC, TRIG, CHOLHDL, LDLDIRECT in the last 72 hours. Thyroid Function Tests: No results for input(s): TSH, T4TOTAL, FREET4, T3FREE, THYROIDAB in the last 72 hours. Anemia Panel: Recent Labs    02/10/21 0120  FERRITIN 80   Urine analysis:    Component Value Date/Time   COLORURINE YELLOW 02/09/2021 Century 02/09/2021 0917   LABSPEC 1.008 02/09/2021 7672  PHURINE 6.0 02/09/2021 0917   GLUCOSEU NEGATIVE 02/09/2021 0917   GLUCOSEU NEGATIVE 04/27/2019 1041   HGBUR LARGE (A) 02/09/2021 0917   BILIRUBINUR NEGATIVE 02/09/2021 0917   KETONESUR NEGATIVE 02/09/2021 0917   PROTEINUR NEGATIVE 02/09/2021 0917   UROBILINOGEN 0.2 04/27/2019 1041   NITRITE NEGATIVE 02/09/2021 0917   LEUKOCYTESUR NEGATIVE 02/09/2021 0917   Sepsis Labs: @LABRCNTIP (procalcitonin:4,lacticidven:4) ) Recent Results (from the past 240 hour(s))  Blood culture (routine x 2)     Status: Abnormal   Collection Time: 02/05/21  6:15 PM   Specimen: BLOOD  Result Value Ref Range Status   Specimen Description BLOOD SITE NOT SPECIFIED  Final   Special Requests   Final    BOTTLES DRAWN AEROBIC AND ANAEROBIC Blood Culture results may not be optimal due to an inadequate volume of blood received in culture bottles   Culture  Setup Time   Final    GRAM POSITIVE COCCI IN BOTH AEROBIC AND ANAEROBIC BOTTLES CRITICAL RESULT CALLED TO, READ BACK BY AND VERIFIED WITH: PHARMD T. DANG [id number] FCP    Culture (A)  Final    STAPHYLOCOCCUS HOMINIS THE SIGNIFICANCE OF ISOLATING THIS ORGANISM FROM A SINGLE SET OF BLOOD CULTURES WHEN MULTIPLE SETS ARE DRAWN IS UNCERTAIN. PLEASE NOTIFY THE MICROBIOLOGY DEPARTMENT WITHIN ONE WEEK IF SPECIATION AND SENSITIVITIES ARE REQUIRED. Performed at [facility], [place] [address] [place], [place] [address]    Report Status 02/08/2021 FINAL  Final  Blood Culture ID Panel (Reflexed)     Status: Abnormal   Collection Time: 02/05/21  6:15 PM  Result Value Ref Range Status   Enterococcus faecalis NOT DETECTED NOT DETECTED Final   Enterococcus Faecium NOT DETECTED NOT DETECTED Final   Listeria monocytogenes NOT DETECTED NOT DETECTED Final   Staphylococcus species DETECTED (A) NOT DETECTED Final    Comment: CRITICAL RESULT CALLED TO, READ BACK BY AND VERIFIED WITH: PHARMD T. DANG [id number] FCP    Staphylococcus aureus (BCID) NOT DETECTED NOT DETECTED Final   Staphylococcus epidermidis NOT DETECTED NOT DETECTED Final   Staphylococcus lugdunensis NOT DETECTED NOT DETECTED Final   Streptococcus species NOT DETECTED NOT DETECTED Final   Streptococcus agalactiae NOT DETECTED NOT DETECTED Final   Streptococcus pneumoniae NOT DETECTED NOT DETECTED Final   Streptococcus pyogenes NOT DETECTED NOT DETECTED Final   A.calcoaceticus-baumannii NOT DETECTED NOT DETECTED Final   Bacteroides fragilis NOT DETECTED NOT DETECTED Final   Enterobacterales NOT DETECTED NOT DETECTED Final   Enterobacter cloacae complex NOT DETECTED NOT DETECTED Final   Escherichia coli NOT DETECTED NOT DETECTED Final   Klebsiella aerogenes NOT DETECTED NOT DETECTED Final   Klebsiella oxytoca NOT DETECTED NOT DETECTED Final   Klebsiella pneumoniae NOT DETECTED NOT DETECTED Final   Proteus species NOT DETECTED NOT DETECTED Final   Salmonella species NOT DETECTED NOT DETECTED Final   Serratia marcescens NOT DETECTED NOT DETECTED Final   Haemophilus influenzae NOT DETECTED NOT DETECTED Final   Neisseria meningitidis NOT DETECTED NOT DETECTED Final   Pseudomonas aeruginosa NOT DETECTED NOT DETECTED Final   Stenotrophomonas maltophilia NOT DETECTED NOT DETECTED Final   Candida albicans NOT DETECTED NOT DETECTED Final   Candida auris NOT DETECTED NOT DETECTED Final   Candida glabrata NOT DETECTED NOT  DETECTED Final   Candida krusei NOT DETECTED NOT DETECTED Final   Candida parapsilosis NOT DETECTED NOT DETECTED Final   Candida tropicalis NOT DETECTED NOT DETECTED Final   Cryptococcus neoformans/gattii NOT DETECTED NOT DETECTED Final    Comment:  Performed at [facility], [place] [address] [place], [place] [address]  Blood culture (routine x 2)     Status: None   Collection Time: 02/05/21  7:17 PM   Specimen: BLOOD  Result Value Ref Range Status   Specimen Description BLOOD SITE NOT SPECIFIED  Final   Special Requests   Final    BOTTLES DRAWN AEROBIC AND ANAEROBIC Blood Culture adequate volume   Culture   Final    NO GROWTH 5 DAYS Performed at [facility], [address], [place], [place] [address]    Report Status 02/10/2021 FINAL  Final  Resp Panel by RT-PCR (Flu A&B, Covid) Nasopharyngeal Swab     Status: None   Collection Time: 02/05/21 10:30 PM   Specimen: Nasopharyngeal Swab; Nasopharyngeal(NP) swabs in vial transport medium  Result Value Ref Range Status   SARS Coronavirus 2 by RT PCR NEGATIVE NEGATIVE Final    Comment: (NOTE) SARS-CoV-2 target nucleic acids are NOT DETECTED.  The SARS-CoV-2 RNA is generally detectable in upper respiratory specimens during the acute phase of infection. The lowest concentration of SARS-CoV-2 viral copies this assay can detect is 138 copies/mL. A negative result does not preclude SARS-Cov-2 infection and should not be used as the sole basis for treatment or other patient management decisions. A negative result may occur with  improper specimen collection/handling, submission of specimen other than nasopharyngeal swab, presence of viral mutation(s) within the areas targeted by this assay, and inadequate number of viral copies(<138 copies/mL). A negative result must be combined with clinical observations, patient history, and epidemiological information. The expected result is Negative.  Fact Sheet for Patients:   EntrepreneurPulse.com.au  Fact Sheet for Healthcare Providers:  IncredibleEmployment.be  This test is no t yet approved or cleared by the [place] FDA and  has been authorized for detection and/or diagnosis of SARS-CoV-2 by FDA under an Emergency Use Authorization (EUA). This EUA will remain  in effect (meaning this test can be used) for the duration of the COVID-19 declaration under Section 564(b)(1) of the Act, 21 U.S.C.section 360bbb-3(b)(1), unless the authorization is terminated  or revoked sooner.       Influenza A by PCR NEGATIVE NEGATIVE Final   Influenza B by PCR NEGATIVE NEGATIVE Final    Comment: (NOTE) The Xpert Xpress SARS-CoV-2/FLU/RSV plus assay is intended as an aid in the diagnosis of influenza from Nasopharyngeal swab specimens and should not be used as a sole basis for treatment. Nasal washings and aspirates are unacceptable for Xpert Xpress SARS-CoV-2/FLU/RSV testing.  Fact Sheet for Patients: EntrepreneurPulse.com.au  Fact Sheet for Healthcare Providers: IncredibleEmployment.be  This test is not yet approved or cleared by the [place] FDA and has been authorized for detection and/or diagnosis of SARS-CoV-2 by FDA under an Emergency Use Authorization (EUA). This EUA will remain in effect (meaning this test can be used) for the duration of the COVID-19 declaration under Section 564(b)(1) of the Act, 21 U.S.C. section 360bbb-3(b)(1), unless the authorization is terminated or revoked.  Performed at [facility], [place] [address], [place], [place] [address]   Culture, blood (routine x 2)     Status: None (Preliminary result)   Collection Time: 02/06/21  6:50 PM   Specimen: BLOOD RIGHT WRIST  Result Value Ref Range Status   Specimen Description BLOOD RIGHT WRIST  Final   Special Requests AEROBIC BOTTLE ONLY Blood Culture adequate volume  Final   Culture   Final    NO  GROWTH  4 DAYS Performed at [facility], [place] [address] [place], [place] [address]    Report Status PENDING  Incomplete  Culture, blood (routine x 2)     Status: None (Preliminary result)   Collection Time: 02/06/21  7:06 PM   Specimen: BLOOD RIGHT WRIST  Result Value Ref Range Status   Specimen Description BLOOD RIGHT WRIST  Final   Special Requests AEROBIC BOTTLE ONLY Blood Culture adequate volume  Final   Culture   Final    NO GROWTH 4 DAYS Performed at [facility], [place] [address], [place], [place] [address]    Report Status PENDING  Incomplete  Culture, blood (routine x 2)     Status: None (Preliminary result)   Collection Time: 02/08/21  9:55 PM   Specimen: BLOOD LEFT HAND  Result Value Ref Range Status   Specimen Description BLOOD LEFT HAND  Final   Special Requests   Final    BOTTLES DRAWN AEROBIC AND ANAEROBIC Blood Culture adequate volume   Culture   Final    NO GROWTH 2 DAYS Performed at [facility], [place] [address] [place], [place] [address]    Report Status PENDING  Incomplete  Culture, blood (routine x 2)     Status: None (Preliminary result)   Collection Time: 02/08/21 10:04 PM   Specimen: BLOOD RIGHT HAND  Result Value Ref Range Status   Specimen Description BLOOD RIGHT HAND  Final   Special Requests   Final    BOTTLES DRAWN AEROBIC AND ANAEROBIC Blood Culture adequate volume   Culture  Setup Time   Final    AEROBIC BOTTLE ONLY GRAM POSITIVE COCCI CRITICAL VALUE NOTED.  VALUE IS CONSISTENT WITH PREVIOUSLY REPORTED AND CALLED VALUE.    Culture   Final    NO GROWTH 2 DAYS Performed at [facility], [place] [address] [place], [place] [address]    Report Status PENDING  Incomplete         Radiology Studies: MR HIP LEFT W WO CONTRAST  Result Date: 02/09/2021 CLINICAL DATA:  Left hip and gluteal pain. Fever. Question osteomyelitis. EXAM: MRI OF THE LEFT HIP WITHOUT AND WITH CONTRAST TECHNIQUE: Multiplanar, multisequence MR imaging  was performed both before and after administration of intravenous contrast. CONTRAST:  7.5 mL GADAVIST IV SOLN COMPARISON:  CT abdomen and pelvis 02/05/2021 in 12/20/2016. FINDINGS: Bones: There is no marrow edema or enhancement to suggest osteomyelitis. No fracture, stress change or worrisome lesion. Markedly T1 and T2 hypointense lesion in the right acetabulum is consistent with a bone island and stable since 2018. No avascular necrosis of the femoral heads. No subchondral cyst formation or edema about the hips. Articular cartilage and labrum Articular cartilage:  Mildly thinned without focal defect. Labrum:  The superior labrum is degenerated and frayed. Joint or bursal effusion Joint effusion:  Small left hip joint effusion. Bursae: Negative. Muscles and tendons Muscles and tendons:  Intact. Other findings Miscellaneous: Imaged intrapelvic contents demonstrate no acute or focal abnormality. The patient is status post hysterectomy. IMPRESSION: Negative for osteomyelitis or acute bony abnormality. Small left hip joint effusion is likely related to degenerative change although it cannot be definitively characterized. Electronically Signed   By: Inge Rise M.D.   On: 02/09/2021 15:18   CT CHEST ABDOMEN PELVIS W CONTRAST  Result Date: 02/10/2021 CLINICAL DATA:  Fever of unknown origin EXAM: CT CHEST, ABDOMEN, AND PELVIS WITH CONTRAST TECHNIQUE: Multidetector CT imaging of the chest, abdomen and  pelvis was performed following the standard protocol during bolus administration of intravenous contrast. CONTRAST:  167mL OMNIPAQUE IOHEXOL 300 MG/ML  SOLN COMPARISON:  CT abdomen pelvis 09/07/2021, chest radiograph 09/07/2021, CT neck with contrast 06/18/2019, thyroid ultrasound 10/15/2013 FINDINGS: CT CHEST FINDINGS Cardiovascular: Marked cardiomegaly with postsurgical changes from prior sternotomy and placement of a Saint Jude mitral valve prosthesis. Extensive coronary artery calcification is noted as well.  Atherosclerotic plaque within the normal caliber aorta. No acute luminal abnormality of the imaged aorta. No periaortic stranding or hemorrhage. Normal 3 vessel branching of the aortic arch. Proximal great vessels are unremarkable. Central pulmonary arteries are top-normal caliber. No large central filling defects are present within limitations of this non tailored examination of the pulmonary arteries. Mediastinum/Nodes: Postsurgical changes in the anterior mediastinum related to prior sternotomy. No mediastinal fluid or gas. Heterogeneous and enlarged left thyroid gland with suggestion of multiple underlying thyroid nodules, similar to comparison 06/18/2019. No acute abnormality of the trachea or esophagus. No worrisome mediastinal, hilar or axillary adenopathy. Lungs/Pleura: Low volumes and atelectasis with additional bandlike areas of further atelectasis and/or scarring. Diffuse mild airways thickening and scattered secretions. No consolidative process. No pneumothorax or effusion. No convincing CT features of edema. Few scattered sub 5 mm solid and subsolid nodules seen in the left upper lobe (4/55, 51, 60). Additional solid 5 mm nodule in the right upper lobe adjacent the fissure (4/83). No other concerning nodules or masses. Musculoskeletal: Multilevel degenerative changes are present in the imaged portions of the spine. No acute osseous abnormality or suspicious osseous lesion. Additional degenerative changes in the shoulders prior sternotomy. Bony fusion across the sternal plate. CT ABDOMEN PELVIS FINDINGS Hepatobiliary: No worrisome focal liver lesions. Smooth liver surface contour. Normal hepatic attenuation. Prior cholecystectomy. No biliary dilatation or intraductal gallstones. Pancreas: No pancreatic ductal dilatation or surrounding inflammatory changes. Spleen: Normal in size. No concerning splenic lesions. Adrenals/Urinary Tract: Normal adrenal glands. Stable mild perinephric stranding, nonspecific.  Kidneys are normally located with symmetric enhancementwith symmetrically delayed excretion. Fluid attenuation 5.7 cm cyst in the left kidney. Additional probable 1.4 cm cyst in the interpolar right kidney as well. Additional subcentimeter hypoattenuating foci in both kidneys too small to fully characterize on CT imaging but statistically likely benign. no suspicious renal lesion, urolithiasis or hydronephrosis. Urinary bladder is moderately distended but without other gross abnormality. Stomach/Bowel: Distal esophagus, stomach and duodenal sweep are unremarkable. No small bowel wall thickening or dilatation. A normal appendix is visualized. No colonic dilatation or wall thickening. Moderate stool burden. No evidence of bowel obstruction with high attenuation enteric contrast media traversing to the level of the distal ileum. Vascular/Lymphatic: Atherosclerotic calcifications within the abdominal aorta and branch vessels. No aneurysm or ectasia. No enlarged abdominopelvic lymph nodes. Reproductive: Uterus is surgically absent. No concerning adnexal lesions. No concerning adnexal lesions. Other: Small upper abdominal fat containing ventral hernia anterior to the falciform ligament (3/63) with small amount of fluid in the hernia sac. No bowel containing hernia. No abdominopelvic free air or fluid. Multiple sites of focal soft tissue stranding and punctate foci of soft tissue gas along the anterior abdominal wall likely related to injectable use. Mild body wall edema. Calcified injection granulomata posteriorly as well. Musculoskeletal: Multilevel degenerative changes are present in the imaged portions of the spine. Features most pronounced L4-S1 with some mild interspinous arthrosis as well. Stable sclerotic focus in the right iliac measuring up to 18 mm in size with narrow zone of transition. Appearance and stability favoring benignity. No concerning  or acute osseous abnormalities. IMPRESSION: 1. Low volumes and  atelectasis with mild airways thickening and scattered secretions. Correlate for features of bronchitis. 2. No other acute intrathoracic process. 3. Few scattered sub 5 mm solid and subsolid nodules in the left upper lobe, nonspecific no can be post infectious or inflammatory. No follow-up needed if patient is low-risk (and has no known or suspected primary neoplasm). Non-contrast chest CT can be considered in 12 months if patient is high-risk. This recommendation follows the consensus statement: Guidelines for Management of Incidental Pulmonary Nodules Detected on CT Images: From the Fleischner Society 2017; Radiology 2017; 284:228-243. 4. Small upper abdominal fat containing ventral hernia anterior to the falciform ligament with small amount of fluid in the hernia sac, unchanged from prior. No bowel containing hernia. 5. Multiple sites of focal soft tissue stranding and punctate foci of soft tissue gas along the anterior abdominal wall likely related to injectable use. Correlate with visual inspection. 6. No other acute or conspicuous abdominopelvic abnormalities provide cause for patient's symptoms. 7. Prior mitral valvuloplasty. 8. Aortic Atherosclerosis (ICD10-I70.0). Electronically Signed   By: Lovena Le M.D.   On: 02/10/2021 03:04   ECHOCARDIOGRAM COMPLETE  Result Date: 02/10/2021    ECHOCARDIOGRAM REPORT   Patient Name:   Beth Miller Date of Exam: 02/10/2021 Medical Rec #:  [id number]       Height:       62.0 in Accession #:    [id number]      Weight:       176.2 lb Date of Birth:  1941/04/15      BSA:          1.811 m Patient Age:    9 years        BP:           107/65 mmHg Patient Gender: F               HR:           95 bpm. Exam Location:  Inpatient Procedure: 2D Echo, Cardiac Doppler, Color Doppler and Intracardiac            Opacification Agent Indications:    Fever                 S/P mitral valve replacement  History:        Patient has prior history of Echocardiogram examinations, most                  recent 05/10/2020. CHF, Arrythmias:Atrial Fibrillation; Risk                 Factors:Hypertension and Diabetes. S/p mechanical mitral valve                 replacement (St. Jude mechanical valve).  Sonographer:    Clayton Lefort RDCS (AE) Referring Phys: [id number] [place]  1. Left ventricular ejection fraction, by estimation, is 55%%. The left ventricle has low normal function. The left ventricle has no regional wall motion abnormalities. There is mild left ventricular hypertrophy. Left ventricular diastolic parameters are indeterminate.  2. Right ventricular systolic function is low normal. The right ventricular size is normal. There is moderately elevated pulmonary artery systolic pressure.  3. Left atrial size was severely dilated.  4. Right atrial size was severely dilated.  5. MV prosthesis (St. Jude mechanical) is well seated, appears to open well. mean gradient through the valve is approximately 4 mm Hg. . The mitral valve has  been repaired/replaced. Trivial mitral valve regurgitation.  6. TR is eccentric, directed toward the interatrial septum.. Tricuspid valve regurgitation is moderate to severe.  7. The aortic valve is tricuspid. Aortic valve regurgitation is not visualized. Mild to moderate aortic valve sclerosis/calcification is present, without any evidence of aortic stenosis.  8. Aortic dilatation noted. There is mild dilatation of the ascending aorta, measuring 40 mm.  9. The inferior vena cava is dilated in size with <50% respiratory variability, suggesting right atrial pressure of 15 mmHg. Comparison(s): The left ventricular function No significant change. FINDINGS  Left Ventricle: Left ventricular ejection fraction, by estimation, is 55%%. The left ventricle has low normal function. The left ventricle has no regional wall motion abnormalities. Definity contrast agent was given IV to delineate the left ventricular endocardial borders. The left ventricular internal cavity  size was normal in size. There is mild left ventricular hypertrophy. Left ventricular diastolic parameters are indeterminate. Right Ventricle: The right ventricular size is normal. Right vetricular wall thickness was not assessed. Right ventricular systolic function is low normal. There is moderately elevated pulmonary artery systolic pressure. The tricuspid regurgitant velocity is 3.07 m/s, and with an assumed right atrial pressure of 15 mmHg, the estimated right ventricular systolic pressure is 62.1 mmHg. Left Atrium: Left atrial size was severely dilated. Right Atrium: Right atrial size was severely dilated. Pericardium: Trivial pericardial effusion is present. 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 mean gradient through the valve is approximately 4 mm Hg. The mitral valve has been repaired/replaced. Trivial mitral valve regurgitation. There is a [employer] mechanical valve present in the mitral position. The mean mitral valve gradient is 4.0 mmHg. Tricuspid Valve: TR is eccentric, directed toward the interatrial septum. The tricuspid valve is normal in structure. Tricuspid valve regurgitation is moderate to severe. Aortic Valve: The aortic valve is tricuspid. Aortic valve regurgitation is not visualized. Mild to moderate aortic valve sclerosis/calcification is present, without any evidence of aortic stenosis. Aortic valve mean gradient measures 5.4 mmHg. Aortic valve peak gradient measures 9.4 mmHg. Aortic valve area, by VTI measures 1.38 cm. Pulmonic Valve: The pulmonic valve was normal in structure. Pulmonic valve regurgitation is not visualized. Aorta: The aortic root is normal in size and structure and aortic dilatation noted. There is mild dilatation of the ascending aorta, measuring 40 mm. Venous: The inferior vena cava is dilated in size with less than 50% respiratory variability, suggesting right atrial pressure of 15 mmHg. IAS/Shunts: No atrial level shunt  detected by color flow Doppler.  LEFT VENTRICLE PLAX 2D LVIDd:         3.90 cm LVIDs:         2.70 cm LV PW:         1.70 cm LV IVS:        1.40 cm LVOT diam:     1.90 cm LV SV:         36 LV SV Index:   20 LVOT Area:     2.84 cm  LV Volumes (MOD) LV vol d, MOD A2C: 78.1 ml LV vol d, MOD A4C: 69.7 ml LV vol s, MOD A2C: 49.2 ml LV vol s, MOD A4C: 42.3 ml LV SV MOD A2C:     28.9 ml LV SV MOD A4C:     69.7 ml LV SV MOD BP:      28.1 ml RIGHT VENTRICLE            IVC RV Basal diam:  3.40 cm    IVC diam: 2.50 cm RV S prime:     8.38 cm/s TAPSE (M-mode): 1.2 cm LEFT ATRIUM              Index        RIGHT ATRIUM           Index LA diam:        5.20 cm  2.87 cm/m   RA Area:     35.80 cm LA Vol (A2C):   288.0 ml 158.99 ml/m RA Volume:   122.00 ml 67.35 ml/m LA Vol (A4C):   189.0 ml 104.33 ml/m LA Biplane Vol: 238.0 ml 131.38 ml/m  AORTIC VALVE AV Area (Vmax):    1.33 cm AV Area (Vmean):   1.29 cm AV Area (VTI):     1.38 cm AV Vmax:           153.26 cm/s AV Vmean:          105.920 cm/s AV VTI:            0.262 m AV Peak Grad:      9.4 mmHg AV Mean Grad:      5.4 mmHg LVOT Vmax:         72.00 cm/s LVOT Vmean:        48.267 cm/s LVOT VTI:          0.127 m LVOT/AV VTI ratio: 0.49  AORTA Ao Root diam: 3.20 cm Ao Asc diam:  4.00 cm MITRAL VALVE           TRICUSPID VALVE MV Mean grad: 4.0 mmHg TR Peak grad:   37.7 mmHg                        TR Vmax:        307.00 cm/s                         SHUNTS                        Systemic VTI:  0.13 m                        Systemic Diam: 1.90 cm Dorris Carnes MD Electronically signed by Dorris Carnes MD Signature Date/Time: 02/10/2021/5:02:11 PM    Final       Scheduled Meds: . diltiazem  240 mg Oral Daily  . enoxaparin (LOVENOX) injection  80 mg Subcutaneous Q12H  . gabapentin  100 mg Oral QHS  . insulin aspart  0-9 Units Subcutaneous TID WC  . memantine  5 mg Oral BID  . metoprolol tartrate  25 mg Oral BID  . mirabegron ER  25 mg Oral Daily  . pantoprazole  40 mg Oral  Daily  . Warfarin - Pharmacist Dosing Inpatient   Does not apply q1600   Continuous Infusions:   LOS: 5 days      Debbe Odea, MD [facility] Pager: [email or web address] 02/10/2021, 6:46 PM

## 2021-02-10 NOTE — Progress Notes (Signed)
PROGRESS NOTE    Beth Miller   UXN:[id number]  DOB: 04-01-1941  DOA: 02/05/2021 PCP: Cassandria Anger, MD   Brief Narrative:  Beth Miller is an 80 year old female with mechanical mitral valve on Coumadin, chronic diastolic heart failure, type 2 diabetes mellitus, hypertension who presented to the hospital with abdominal pain diarrhea occurring on Sunday.  She said she had 4 episodes of watery diarrhea with abdominal pain and fatigue.  ED: Temperature 101.4, tachycardic, blood pressure 109/95, respiratory rate in 20s.  Cardiac rhythm was atrial fibrillation.   She was started on IV Cardizem infusion after bolus IV fluids. No antibiotics given. Later complained of left buttock pain with movement.  Subjective: Left buttock pain is MUCH better. 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    Assessment & Plan:   Principal Problem: Abdominal pain, diarrhea with fever, tachycardia, tachypnea -sepsis POA - ongoing fevers -abdominal pain and diarrhea resolved - he states she did not eat anything out of the ordinary or take any laxatives - tachycardia being controlled with meds (A fib)  Ongoing fevers - 1 set of blood cultures positive for staph hominis on 4/12 - 4/14>100.1- this is despite taking TID Acetaminophen and Hydrocodone/acetominophen- stop acetaminophen and cont to follow temps closely - 4/15> fever overnight 101.6 - she has no respiratory symptoms, no GI symptoms, no dental pain, no joint pain or rash, no headache - she has urinary incontinence and repeat UA is negative - CT abd/pelvis on admission unrevealing - procalcitonin < 0.10 - HIV non reactive - Hepatitis panel negative - ID ordered an MRI left hip which is negative - 4/17> blood culture from 4/14 returned - 1 set is + for gram + cocci- awaiting identification - ID has order 2 more sets of blood cultures this AM   Active Problems:    Atrial fibrillation with RVR   -Off Cardizem infusion and on oral Cardizem now at a dose of 240 mg - 4/14>  added metoprolol - HR improved- follow BP closely  Left gluteal pain -On exam on 4/13, she had in pain when I squeeze her upper outer gluteal area-I believe this is musculoskeletal-my colleague has started Flexeril 3 times daily and a lidocaine patch which can be continued - I have asked her to avoid activities that exacerbate the pain - she can have PRN Oxycodone for severe pain  - she states it is improving and she is able to move around better    DM2 (diabetes mellitus, type 2)  -Continue sliding scale insulin - A1c 6.8 on 4/5   Chemical mitral valve with subtherapeutic INR -INR is 2.5 today-continue Lovenox and Coumadin per pharmacy - she also has hematuria which is likely related to this mechanical valve  Time spent in minutes: 35 DVT prophylaxis: Lovenox Code Status: Full code Family Communication:  Level of Care: Level of care: Progressive- transition to telemetry Disposition Plan:  Status is: Inpatient  Remains inpatient appropriate because:Inpatient level of care appropriate due to severity of illness- ongoing fevers w/o a source   Dispo: The patient is from: Home              Anticipated d/c is to: Home              Patient currently is not medically stable to d/c.   Difficult to place patient No   Consultants:   ID Procedures:   none Antimicrobials:  Anti-infectives (From admission, onward)  None       Objective: Vitals:   02/09/21 2334 02/10/21 0304 02/10/21 0632 02/10/21 0755  BP: 107/73 108/67  111/72  Pulse: 98 79  96  Resp: 17 16  15   Temp: 99.3 F (37.4 C) 99.3 F (37.4 C)  99.1 F (37.3 C)  TempSrc: Oral Oral  Oral  SpO2: 94% 96%  92%  Weight:   79.9 kg   Height:        Intake/Output Summary (Last 24 hours) at 02/10/2021 1032 Last data filed at 02/09/2021 1400 Gross per 24 hour  Intake 237 ml  Output --  Net 237 ml   Filed Weights   02/08/21 0500  02/09/21 0629 02/10/21 9604  Weight: 80.5 kg 79.9 kg 79.9 kg    Examination: General exam: Appears comfortable  HEENT: PERRLA, oral mucosa moist, no sclera icterus or thrush Respiratory system: Clear to auscultation. Respiratory effort normal. Cardiovascular system: S1 & S2 heard, IIRR- metallic click Gastrointestinal system: Abdomen soft, non-tender, nondistended. Normal bowel sounds   Central nervous system: Alert and oriented. No focal neurological deficits. Extremities: No cyanosis, clubbing or edema Skin: No rashes or ulcers Psychiatry:  Mood & affect appropriate.    Data Reviewed: I have personally reviewed following labs and imaging studies  CBC: Recent Labs  Lab 02/05/21 1814 02/05/21 1911 02/06/21 0705 02/07/21 0413 02/08/21 0209  WBC 8.5  --  6.9 6.8 7.3  NEUTROABS 6.4  --   --   --   --   HGB 10.7* 11.2* 10.0* 9.9* 9.6*  HCT 31.4* 33.0* 29.2* 29.1* 28.3*  MCV 80.1  --  79.3* 79.7* 78.8*  PLT 261  --  238 230 540   Basic Metabolic Panel: Recent Labs  Lab 02/05/21 1814 02/05/21 1911 02/06/21 0705 02/07/21 0413 02/08/21 0209  NA 135 136 139 136 136  K 4.1 4.2 3.8 3.7 4.0  CL 102 102 103 103 102  CO2 27  --  27 27 28   GLUCOSE 152* 153* 113* 112* 155*  BUN 16 17 13 10 13   CREATININE 1.09* 1.10* 0.90 0.94 0.96  CALCIUM 9.7  --  9.5 9.3 9.3  MG  --   --  1.7  --   --    GFR: Estimated Creatinine Clearance: 45 mL/min (by C-G formula based on SCr of 0.96 mg/dL). Liver Function Tests: Recent Labs  Lab 02/05/21 1814  AST 21  ALT 13  ALKPHOS 61  BILITOT 1.0  PROT 6.9  ALBUMIN 3.7   Recent Labs  Lab 02/05/21 1814  LIPASE 47   No results for input(s): AMMONIA in the last 168 hours. Coagulation Profile: Recent Labs  Lab 02/06/21 0705 02/07/21 0413 02/08/21 0209 02/09/21 0250 02/10/21 0120  INR 1.8* 2.1* 2.4* 2.3* 2.3*   Cardiac Enzymes: Recent Labs  Lab 02/10/21 0120  CKTOTAL 37*   BNP (last 3 results) No results for input(s): PROBNP  in the last 8760 hours. HbA1C: No results for input(s): HGBA1C in the last 72 hours. CBG: Recent Labs  Lab 02/09/21 0614 02/09/21 1114 02/09/21 1700 02/09/21 2013 02/10/21 0618  GLUCAP 101* 99 119* 137* 130*   Lipid Profile: No results for input(s): CHOL, HDL, LDLCALC, TRIG, CHOLHDL, LDLDIRECT in the last 72 hours. Thyroid Function Tests: No results for input(s): TSH, T4TOTAL, FREET4, T3FREE, THYROIDAB in the last 72 hours. Anemia Panel: Recent Labs    02/10/21 0120  FERRITIN 80   Urine analysis:    Component Value Date/Time   COLORURINE YELLOW 02/09/2021  [place] 02/09/2021 0917   LABSPEC 1.008 02/09/2021 0917   PHURINE 6.0 02/09/2021 0917   GLUCOSEU NEGATIVE 02/09/2021 0917   GLUCOSEU NEGATIVE 04/27/2019 1041   HGBUR LARGE (A) 02/09/2021 0917   BILIRUBINUR NEGATIVE 02/09/2021 0917   KETONESUR NEGATIVE 02/09/2021 0917   PROTEINUR NEGATIVE 02/09/2021 0917   UROBILINOGEN 0.2 04/27/2019 1041   NITRITE NEGATIVE 02/09/2021 0917   LEUKOCYTESUR NEGATIVE 02/09/2021 0917   Sepsis Labs: @LABRCNTIP (procalcitonin:4,lacticidven:4) ) Recent Results (from the past 240 hour(s))  Blood culture (routine x 2)     Status: Abnormal   Collection Time: 02/05/21  6:15 PM   Specimen: BLOOD  Result Value Ref Range Status   Specimen Description BLOOD SITE NOT SPECIFIED  Final   Special Requests   Final    BOTTLES DRAWN AEROBIC AND ANAEROBIC Blood Culture results may not be optimal due to an inadequate volume of blood received in culture bottles   Culture  Setup Time   Final    GRAM POSITIVE COCCI IN BOTH AEROBIC AND ANAEROBIC BOTTLES CRITICAL RESULT CALLED TO, READ BACK BY AND VERIFIED WITH: PHARMD T. DANG [phone] FCP    Culture (A)  Final    STAPHYLOCOCCUS HOMINIS THE SIGNIFICANCE OF ISOLATING THIS ORGANISM FROM A SINGLE SET OF BLOOD CULTURES WHEN MULTIPLE SETS ARE DRAWN IS UNCERTAIN. PLEASE NOTIFY THE MICROBIOLOGY DEPARTMENT WITHIN ONE WEEK IF SPECIATION AND  SENSITIVITIES ARE REQUIRED. Performed at [facility], [place] [address] [place], [place] [address]    Report Status 02/08/2021 FINAL  Final  Blood Culture ID Panel (Reflexed)     Status: Abnormal   Collection Time: 02/05/21  6:15 PM  Result Value Ref Range Status   Enterococcus faecalis NOT DETECTED NOT DETECTED Final   Enterococcus Faecium NOT DETECTED NOT DETECTED Final   Listeria monocytogenes NOT DETECTED NOT DETECTED Final   Staphylococcus species DETECTED (A) NOT DETECTED Final    Comment: CRITICAL RESULT CALLED TO, READ BACK BY AND VERIFIED WITH: PHARMD T. DANG [phone] FCP    Staphylococcus aureus (BCID) NOT DETECTED NOT DETECTED Final   Staphylococcus epidermidis NOT DETECTED NOT DETECTED Final   Staphylococcus lugdunensis NOT DETECTED NOT DETECTED Final   Streptococcus species NOT DETECTED NOT DETECTED Final   Streptococcus agalactiae NOT DETECTED NOT DETECTED Final   Streptococcus pneumoniae NOT DETECTED NOT DETECTED Final   Streptococcus pyogenes NOT DETECTED NOT DETECTED Final   A.calcoaceticus-baumannii NOT DETECTED NOT DETECTED Final   Bacteroides fragilis NOT DETECTED NOT DETECTED Final   Enterobacterales NOT DETECTED NOT DETECTED Final   Enterobacter cloacae complex NOT DETECTED NOT DETECTED Final   Escherichia coli NOT DETECTED NOT DETECTED Final   Klebsiella aerogenes NOT DETECTED NOT DETECTED Final   Klebsiella oxytoca NOT DETECTED NOT DETECTED Final   Klebsiella pneumoniae NOT DETECTED NOT DETECTED Final   Proteus species NOT DETECTED NOT DETECTED Final   Salmonella species NOT DETECTED NOT DETECTED Final   Serratia marcescens NOT DETECTED NOT DETECTED Final   Haemophilus influenzae NOT DETECTED NOT DETECTED Final   Neisseria meningitidis NOT DETECTED NOT DETECTED Final   Pseudomonas aeruginosa NOT DETECTED NOT DETECTED Final   Stenotrophomonas maltophilia NOT DETECTED NOT DETECTED Final   Candida albicans NOT DETECTED NOT DETECTED Final   Candida  auris NOT DETECTED NOT DETECTED Final   Candida glabrata NOT DETECTED NOT DETECTED Final   Candida krusei NOT DETECTED NOT DETECTED Final   Candida parapsilosis NOT DETECTED NOT DETECTED Final   Candida tropicalis NOT DETECTED NOT  DETECTED Final   Cryptococcus neoformans/gattii NOT DETECTED NOT DETECTED Final    Comment: Performed at [facility], [place] [address] [place], [place] [address]  Blood culture (routine x 2)     Status: None (Preliminary result)   Collection Time: 02/05/21  7:17 PM   Specimen: BLOOD  Result Value Ref Range Status   Specimen Description BLOOD SITE NOT SPECIFIED  Final   Special Requests   Final    BOTTLES DRAWN AEROBIC AND ANAEROBIC Blood Culture adequate volume   Culture   Final    NO GROWTH 4 DAYS Performed at [facility], [address] [place], [place] [address]    Report Status PENDING  Incomplete  Resp Panel by RT-PCR (Flu A&B, Covid) Nasopharyngeal Swab     Status: None   Collection Time: 02/05/21 10:30 PM   Specimen: Nasopharyngeal Swab; Nasopharyngeal(NP) swabs in vial transport medium  Result Value Ref Range Status   SARS Coronavirus 2 by RT PCR NEGATIVE NEGATIVE Final    Comment: (NOTE) SARS-CoV-2 target nucleic acids are NOT DETECTED.  The SARS-CoV-2 RNA is generally detectable in upper respiratory specimens during the acute phase of infection. The lowest concentration of SARS-CoV-2 viral copies this assay can detect is 138 copies/mL. A negative result does not preclude SARS-Cov-2 infection and should not be used as the sole basis for treatment or other patient management decisions. A negative result may occur with  improper specimen collection/handling, submission of specimen other than nasopharyngeal swab, presence of viral mutation(s) within the areas targeted by this assay, and inadequate number of viral copies(<138 copies/mL). A negative result must be combined with clinical observations, patient history, and  epidemiological information. The expected result is Negative.  Fact Sheet for Patients:  EntrepreneurPulse.com.au  Fact Sheet for Healthcare Providers:  IncredibleEmployment.be  This test is no t yet approved or cleared by the [place] FDA and  has been authorized for detection and/or diagnosis of SARS-CoV-2 by FDA under an Emergency Use Authorization (EUA). This EUA will remain  in effect (meaning this test can be used) for the duration of the COVID-19 declaration under Section 564(b)(1) of the Act, 21 U.S.C.section 360bbb-3(b)(1), unless the authorization is terminated  or revoked sooner.       Influenza A by PCR NEGATIVE NEGATIVE Final   Influenza B by PCR NEGATIVE NEGATIVE Final    Comment: (NOTE) The Xpert Xpress SARS-CoV-2/FLU/RSV plus assay is intended as an aid in the diagnosis of influenza from Nasopharyngeal swab specimens and should not be used as a sole basis for treatment. Nasal washings and aspirates are unacceptable for Xpert Xpress SARS-CoV-2/FLU/RSV testing.  Fact Sheet for Patients: EntrepreneurPulse.com.au  Fact Sheet for Healthcare Providers: IncredibleEmployment.be  This test is not yet approved or cleared by the [place] FDA and has been authorized for detection and/or diagnosis of SARS-CoV-2 by FDA under an Emergency Use Authorization (EUA). This EUA will remain in effect (meaning this test can be used) for the duration of the COVID-19 declaration under Section 564(b)(1) of the Act, 21 U.S.C. section 360bbb-3(b)(1), unless the authorization is terminated or revoked.  Performed at [facility], [place] [address], [place], [place] [address]   Culture, blood (routine x 2)     Status: None (Preliminary result)   Collection Time: 02/06/21  6:50 PM   Specimen: BLOOD RIGHT WRIST  Result Value Ref Range Status   Specimen Description BLOOD RIGHT WRIST  Final   Special  Requests AEROBIC BOTTLE ONLY Blood  Culture adequate volume  Final   Culture   Final    NO GROWTH 3 DAYS Performed at [facility], [place] [address] [place], [place] [address]    Report Status PENDING  Incomplete  Culture, blood (routine x 2)     Status: None (Preliminary result)   Collection Time: 02/06/21  7:06 PM   Specimen: BLOOD RIGHT WRIST  Result Value Ref Range Status   Specimen Description BLOOD RIGHT WRIST  Final   Special Requests AEROBIC BOTTLE ONLY Blood Culture adequate volume  Final   Culture   Final    NO GROWTH 3 DAYS Performed at [facility], [address], [place], [place] [address]    Report Status PENDING  Incomplete  Culture, blood (routine x 2)     Status: None (Preliminary result)   Collection Time: 02/08/21  9:55 PM   Specimen: BLOOD LEFT HAND  Result Value Ref Range Status   Specimen Description BLOOD LEFT HAND  Final   Special Requests   Final    BOTTLES DRAWN AEROBIC AND ANAEROBIC Blood Culture adequate volume   Culture   Final    NO GROWTH < 12 HOURS Performed at [facility], [place] [address] [place], [place] [address]    Report Status PENDING  Incomplete  Culture, blood (routine x 2)     Status: None (Preliminary result)   Collection Time: 02/08/21 10:04 PM   Specimen: BLOOD RIGHT HAND  Result Value Ref Range Status   Specimen Description BLOOD RIGHT HAND  Final   Special Requests   Final    BOTTLES DRAWN AEROBIC AND ANAEROBIC Blood Culture adequate volume   Culture  Setup Time   Final    AEROBIC BOTTLE ONLY GRAM POSITIVE COCCI CRITICAL VALUE NOTED.  VALUE IS CONSISTENT WITH PREVIOUSLY REPORTED AND CALLED VALUE.    Culture   Final    NO GROWTH < 12 HOURS Performed at [facility], [place] [address] [place], [place] [address]    Report Status PENDING  Incomplete         Radiology Studies: MR HIP LEFT W WO CONTRAST  Result Date: 02/09/2021 CLINICAL DATA:  Left hip and gluteal pain. Fever. Question osteomyelitis.  EXAM: MRI OF THE LEFT HIP WITHOUT AND WITH CONTRAST TECHNIQUE: Multiplanar, multisequence MR imaging was performed both before and after administration of intravenous contrast. CONTRAST:  7.5 mL GADAVIST IV SOLN COMPARISON:  CT abdomen and pelvis 02/05/2021 in 12/20/2016. FINDINGS: Bones: There is no marrow edema or enhancement to suggest osteomyelitis. No fracture, stress change or worrisome lesion. Markedly T1 and T2 hypointense lesion in the right acetabulum is consistent with a bone island and stable since 2018. No avascular necrosis of the femoral heads. No subchondral cyst formation or edema about the hips. Articular cartilage and labrum Articular cartilage:  Mildly thinned without focal defect. Labrum:  The superior labrum is degenerated and frayed. Joint or bursal effusion Joint effusion:  Small left hip joint effusion. Bursae: Negative. Muscles and tendons Muscles and tendons:  Intact. Other findings Miscellaneous: Imaged intrapelvic contents demonstrate no acute or focal abnormality. The patient is status post hysterectomy. IMPRESSION: Negative for osteomyelitis or acute bony abnormality. Small left hip joint effusion is likely related to degenerative change although it cannot be definitively characterized. Electronically Signed   By: Inge Rise M.D.   On: 02/09/2021 15:18   CT CHEST ABDOMEN PELVIS W CONTRAST  Result Date: 02/10/2021 CLINICAL DATA:  Fever of unknown  origin EXAM: CT CHEST, ABDOMEN, AND PELVIS WITH CONTRAST TECHNIQUE: Multidetector CT imaging of the chest, abdomen and pelvis was performed following the standard protocol during bolus administration of intravenous contrast. CONTRAST:  17mL OMNIPAQUE IOHEXOL 300 MG/ML  SOLN COMPARISON:  CT abdomen pelvis 09/07/2021, chest radiograph 09/07/2021, CT neck with contrast 06/18/2019, thyroid ultrasound 10/15/2013 FINDINGS: CT CHEST FINDINGS Cardiovascular: Marked cardiomegaly with postsurgical changes from prior sternotomy and placement of  a Saint Jude mitral valve prosthesis. Extensive coronary artery calcification is noted as well. Atherosclerotic plaque within the normal caliber aorta. No acute luminal abnormality of the imaged aorta. No periaortic stranding or hemorrhage. Normal 3 vessel branching of the aortic arch. Proximal great vessels are unremarkable. Central pulmonary arteries are top-normal caliber. No large central filling defects are present within limitations of this non tailored examination of the pulmonary arteries. Mediastinum/Nodes: Postsurgical changes in the anterior mediastinum related to prior sternotomy. No mediastinal fluid or gas. Heterogeneous and enlarged left thyroid gland with suggestion of multiple underlying thyroid nodules, similar to comparison 06/18/2019. No acute abnormality of the trachea or esophagus. No worrisome mediastinal, hilar or axillary adenopathy. Lungs/Pleura: Low volumes and atelectasis with additional bandlike areas of further atelectasis and/or scarring. Diffuse mild airways thickening and scattered secretions. No consolidative process. No pneumothorax or effusion. No convincing CT features of edema. Few scattered sub 5 mm solid and subsolid nodules seen in the left upper lobe (4/55, 51, 60). Additional solid 5 mm nodule in the right upper lobe adjacent the fissure (4/83). No other concerning nodules or masses. Musculoskeletal: Multilevel degenerative changes are present in the imaged portions of the spine. No acute osseous abnormality or suspicious osseous lesion. Additional degenerative changes in the shoulders prior sternotomy. Bony fusion across the sternal plate. CT ABDOMEN PELVIS FINDINGS Hepatobiliary: No worrisome focal liver lesions. Smooth liver surface contour. Normal hepatic attenuation. Prior cholecystectomy. No biliary dilatation or intraductal gallstones. Pancreas: No pancreatic ductal dilatation or surrounding inflammatory changes. Spleen: Normal in size. No concerning splenic lesions.  Adrenals/Urinary Tract: Normal adrenal glands. Stable mild perinephric stranding, nonspecific. Kidneys are normally located with symmetric enhancementwith symmetrically delayed excretion. Fluid attenuation 5.7 cm cyst in the left kidney. Additional probable 1.4 cm cyst in the interpolar right kidney as well. Additional subcentimeter hypoattenuating foci in both kidneys too small to fully characterize on CT imaging but statistically likely benign. no suspicious renal lesion, urolithiasis or hydronephrosis. Urinary bladder is moderately distended but without other gross abnormality. Stomach/Bowel: Distal esophagus, stomach and duodenal sweep are unremarkable. No small bowel wall thickening or dilatation. A normal appendix is visualized. No colonic dilatation or wall thickening. Moderate stool burden. No evidence of bowel obstruction with high attenuation enteric contrast media traversing to the level of the distal ileum. Vascular/Lymphatic: Atherosclerotic calcifications within the abdominal aorta and branch vessels. No aneurysm or ectasia. No enlarged abdominopelvic lymph nodes. Reproductive: Uterus is surgically absent. No concerning adnexal lesions. No concerning adnexal lesions. Other: Small upper abdominal fat containing ventral hernia anterior to the falciform ligament (3/63) with small amount of fluid in the hernia sac. No bowel containing hernia. No abdominopelvic free air or fluid. Multiple sites of focal soft tissue stranding and punctate foci of soft tissue gas along the anterior abdominal wall likely related to injectable use. Mild body wall edema. Calcified injection granulomata posteriorly as well. Musculoskeletal: Multilevel degenerative changes are present in the imaged portions of the spine. Features most pronounced L4-S1 with some mild interspinous arthrosis as well. Stable sclerotic focus in the right iliac measuring  up to 18 mm in size with narrow zone of transition. Appearance and stability  favoring benignity. No concerning or acute osseous abnormalities. IMPRESSION: 1. Low volumes and atelectasis with mild airways thickening and scattered secretions. Correlate for features of bronchitis. 2. No other acute intrathoracic process. 3. Few scattered sub 5 mm solid and subsolid nodules in the left upper lobe, nonspecific no can be post infectious or inflammatory. No follow-up needed if patient is low-risk (and has no known or suspected primary neoplasm). Non-contrast chest CT can be considered in 12 months if patient is high-risk. This recommendation follows the consensus statement: Guidelines for Management of Incidental Pulmonary Nodules Detected on CT Images: From the Fleischner Society 2017; Radiology 2017; 284:228-243. 4. Small upper abdominal fat containing ventral hernia anterior to the falciform ligament with small amount of fluid in the hernia sac, unchanged from prior. No bowel containing hernia. 5. Multiple sites of focal soft tissue stranding and punctate foci of soft tissue gas along the anterior abdominal wall likely related to injectable use. Correlate with visual inspection. 6. No other acute or conspicuous abdominopelvic abnormalities provide cause for patient's symptoms. 7. Prior mitral valvuloplasty. 8. Aortic Atherosclerosis (ICD10-I70.0). Electronically Signed   By: Lovena Le M.D.   On: 02/10/2021 03:04      Scheduled Meds: . diltiazem  240 mg Oral Daily  . enoxaparin (LOVENOX) injection  80 mg Subcutaneous Q12H  . gabapentin  100 mg Oral QHS  . insulin aspart  0-9 Units Subcutaneous TID WC  . memantine  5 mg Oral BID  . metoprolol tartrate  25 mg Oral BID  . mirabegron ER  25 mg Oral Daily  . pantoprazole  40 mg Oral Daily  . warfarin  12.5 mg Oral ONCE-1600  . Warfarin - Pharmacist Dosing Inpatient   Does not apply q1600   Continuous Infusions:   LOS: 5 days      Debbe Odea, MD [facility] Pager: [email or web address] 02/10/2021, 10:32 AM

## 2021-02-10 NOTE — Progress Notes (Signed)
Mobility Specialist: Progress Note   02/10/21 1639  Mobility  Activity Refused mobility   Pt awakened upon entering room. Pt refused mobility stating the medication she is receiving is making her tired. Pt requesting to wait to walk in the morning.   [facility] Mobility Specialist Mobility Specialist Phone: [phone]

## 2021-02-10 NOTE — Progress Notes (Signed)
PT Cancellation Note  Patient Details Name: MILEA KLINK MRN: [id number] DOB: [birth year]   Cancelled Treatment:    Reason Eval/Treat Not Completed: Other (comment)   Patient sleeping soundly--not arousing to name called or knocking on bedside table. RN reports she was up to bathroom twice today and sat up to do her own bath. PT deferred at this time as goal was for stair training and pt too fatigued/sleeping soundly.   Arby Barrette, PT Pager [phone]   Rexanne Mano 02/10/2021, 3:28 PM

## 2021-02-11 DIAGNOSIS — R509 Fever, unspecified: Secondary | ICD-10-CM

## 2021-02-11 LAB — CULTURE, BLOOD (ROUTINE X 2)
Culture: NO GROWTH
Culture: NO GROWTH
Special Requests: ADEQUATE
Special Requests: ADEQUATE

## 2021-02-11 LAB — GLUCOSE, CAPILLARY
Glucose-Capillary: 136 mg/dL — ABNORMAL HIGH (ref 70–99)
Glucose-Capillary: 180 mg/dL — ABNORMAL HIGH (ref 70–99)
Glucose-Capillary: 100 mg/dL — ABNORMAL HIGH (ref 70–99)
Glucose-Capillary: 129 mg/dL — ABNORMAL HIGH (ref 70–99)

## 2021-02-11 LAB — PROTIME-INR
INR: 2.5 — ABNORMAL HIGH (ref 0.8–1.2)
Prothrombin Time: 27 seconds — ABNORMAL HIGH (ref 11.4–15.2)

## 2021-02-11 LAB — CMV ANTIBODY, IGG (EIA): CMV Ab - IgG: >10 U/mL — ABNORMAL HIGH (ref 0.00–0.59)

## 2021-02-11 LAB — RHEUMATOID FACTOR: Rheumatoid fact SerPl-aCnc: <10 IU/mL (ref <14.0)

## 2021-02-11 LAB — CMV IGM: CMV IgM: <30 AU/mL (ref 0.0–29.9)

## 2021-02-11 LAB — ANGIOTENSIN CONVERTING ENZYME: Angiotensin-Converting Enzyme: 50 U/L

## 2021-02-11 MED ORDER — WARFARIN SODIUM 10 MG PO TABS
10.0000 mg | ORAL_TABLET | Freq: Once | ORAL | Status: AC
  Administered 2021-02-11: 10 mg via ORAL
  Filled 2021-02-11: qty 1

## 2021-02-11 NOTE — Progress Notes (Signed)
ID Brief Note  Blood cx 4/14 1/2 sets Gram stain GPC. I have ordered 2 sets of blood cultures today

## 2021-02-11 NOTE — Progress Notes (Signed)
[place] for Warfarin/lovenox Indication: Afib/MVR  Allergies  Allergen Reactions  . Aricept [Donepezil Hcl]     Intolerance - bad dreams  . Food     Bananas and Fish, cause sinus drainage  . Furosemide     cramps  . Quinine Other (See Comments)    Can't hear  . Spironolactone     constipation  . Tramadol     headache  . Sulfadiazine Itching and Rash    Patient Measurements: Height: 5\' 2"  (157.5 cm) Weight: 80.6 kg (177 lb 11.1 oz) IBW/kg (Calculated) : 50.1  Vital Signs: Temp: 99.7 F (37.6 C) (04/17 0618) Temp Source: Oral (04/17 0618) BP: 105/72 (04/17 0618) Pulse Rate: 83 (04/17 0618)  Labs: Recent Labs    02/09/21 0250 02/10/21 0120 02/11/21 0139  LABPROT 25.4* 25.5* 27.0*  INR 2.3* 2.3* 2.5*  CKTOTAL  --  37*  --     Estimated Creatinine Clearance: 45.2 mL/min (by C-G formula based on SCr of 0.96 mg/dL).   Medical History: Past Medical History:  Diagnosis Date  . AF (atrial fibrillation) ([place])   . ALLERGIC RHINITIS   . Blood transfusion without reported diagnosis   . Cataract, senile   . Chest pain   . CHF (congestive heart failure) ([place])   . Chronic anticoagulation   . Diabetes mellitus   . Diabetes mellitus   . Disorder of oral soft tissue    of mouth  . Diverticulosis   . GERD (gastroesophageal reflux disease)   . Hematoma   . Hypertension   . Leg pain   . Low back pain   . Paresthesia   . Rheumatic fever   . Status post mitral valve replacement    St. Jude valve  . Stroke ([place])    tia  . Sweating   . Vitamin D deficiency      Assessment: 80 y/o F presents to the ED with palpitations, low back pain, and diarrhea. On warfarin PTA for Afib and MVR (INR goal 2.5-3.5). INR on admission 2.0. Pharmacy has been consulted to bridge warfarin with Lovenox.   Outpatient warfarin dosing per recent anti-coag notes: 15 mg on Mon/Fri and 10 mg all other days. Last outpatient INR was 2.1 on 3/21.  INR  now therapeutic x1 at 2.5 after slight inc from home regimen. No overt bleeding noted.  Goal of Therapy:  INR 2.5-3.5 per outpatient anti-coag notes Monitor platelets by anticoagulation protocol: Yes   Plan:  Warfarin 10 mg PO x 1 Enoxaparin 80 mg (1 mg/kg) SQ every 12 hours until INR therapeutic x2 Daily PT/INR   Romilda Garret, PharmD PGY1 Acute Care Pharmacy Resident 02/11/2021 7:44 AM  Please check [email or web address] for unit specific pharmacy phone numbers.

## 2021-02-11 NOTE — Plan of Care (Signed)
  Problem: Health Behavior/Discharge Planning: Goal: Ability to manage health-related needs will improve Outcome: Progressing   Problem: Clinical Measurements: Goal: Will remain free from infection Outcome: Progressing   

## 2021-02-11 NOTE — Progress Notes (Signed)
Mobility Specialist: Progress Note   02/11/21 1550  Mobility  Activity Ambulated in hall  Level of Assistance Modified independent, requires aide device or extra time  Assistive Device Four wheel walker  Distance Ambulated (ft) 470 ft  Mobility Response Tolerated well  Mobility performed by Mobility specialist  $Mobility charge 1 Mobility   Post-Mobility: 95 HR  Pt c/o lower back pain on her L side. Pt said it hasn't been as bad today as previous days though. Pt began ambulation independent but used rollator a little over halfway through due to back pain. Pt took one seated break due to back pain and "feet tingling" lasting <1 minute. Pt sitting EOB after walk.   [facility] Ronya [place]crest Mobility Specialist Mobility Specialist Phone: [phone]

## 2021-02-11 NOTE — Progress Notes (Signed)
/*-Physical Therapy Treatment Patient Details Name: Beth Miller MRN: [id number] DOB: 1940-11-14 Today's Date: 02/11/2021    History of Present Illness pt is an 80 y/o female admitted to the ED on 4/11 for evaluation of palpitations, LBP, diarrhea and fatigue. PMHx, AFib, CHF, DM, HTN, LBP, Rheumatic fever s/p MVR with St Jude valve, Stroke.    PT Comments    Patient progressing well towards PT goals. Pt more alert and awake today reporting mild low back pain. Observed doing ADL session at sink without difficulty. Tolerated gait training and stair training with supervision for safety. Declined using RW for support today. Mild drifting noted and occasional use of handrail for support but no overt LOB. HR stable in 90s bpm during session and activity. Pt eager to return home today. Will continue to follow and progress for higher level balance.    Follow Up Recommendations  Home health PT;Supervision for mobility/OOB     Equipment Recommendations  None recommended by PT    Recommendations for Other Services       Precautions / Restrictions Precautions Precautions: Fall Restrictions Weight Bearing Restrictions: No    Mobility  Bed Mobility Overal bed mobility: Needs Assistance Bed Mobility: Rolling;Sidelying to Sit Rolling: Supervision Sidelying to sit: Supervision;HOB elevated       General bed mobility comments: No assist needed. Use of rail.    Transfers Overall transfer level: Needs assistance Equipment used: None Transfers: Sit to/from Stand Sit to Stand: Supervision         General transfer comment: Supervision for safety. Stood from [employer].  Ambulation/Gait Ambulation/Gait assistance: Min guard;Supervision Gait Distance (Feet): 220 Feet Assistive device: None Gait Pattern/deviations: Step-through pattern;Decreased stride length;Drifts right/left Gait velocity: 1.8 ft/sec Gait velocity interpretation: 1.31 - 2.62 ft/sec, indicative of limited community  ambulator General Gait Details: Slow, mildly unsteady gait drifting in both directions but no overt LOB; reaching for rail for support as needed. 2/4 DOE. VSS on RA with HR in 90s bpm.   Stairs Stairs: Yes Stairs assistance: Supervision Stair Management: Step to pattern;Alternating pattern;Two rails Number of Stairs: 3 General stair comments: Cues for safety/technique,   Wheelchair Mobility    Modified Rankin (Stroke Patients Only)       Balance Overall balance assessment: Needs assistance Sitting-balance support: No upper extremity supported;Feet supported Sitting balance-Leahy Scale: Fair     Standing balance support: During functional activity Standing balance-Leahy Scale: Fair Standing balance comment: Standing at sink doing wash up without difficulty; only mild back pain.                            Cognition Arousal/Alertness: Awake/alert Behavior During Therapy: WFL for tasks assessed/performed Overall Cognitive Status: Within Functional Limits for tasks assessed                                        Exercises      General Comments General comments (skin integrity, edema, etc.): HR in 90s bpm throughout session.      Pertinent Vitals/Pain Pain Assessment: 0-10 Pain Score: 2  Pain Location: back, left sided Pain Descriptors / Indicators: Discomfort;Sore Pain Intervention(s): Monitored during session;Repositioned    Home Living                      Prior Function  PT Goals (current goals can now be found in the care plan section) Progress towards PT goals: Progressing toward goals    Frequency    Min 3X/week      PT Plan Current plan remains appropriate    Co-evaluation              AM-PAC PT "6 Clicks" Mobility   Outcome Measure  Help needed turning from your back to your side while in a flat bed without using bedrails?: A Little Help needed moving from lying on your back to sitting  on the side of a flat bed without using bedrails?: A Little Help needed moving to and from a bed to a chair (including a wheelchair)?: A Little Help needed standing up from a chair using your arms (e.g., wheelchair or bedside chair)?: A Little Help needed to walk in hospital room?: A Little Help needed climbing 3-5 steps with a railing? : A Little 6 Click Score: 18    End of Session Equipment Utilized During Treatment: Gait belt Activity Tolerance: Patient tolerated treatment well Patient left: in bed;with call bell/phone within reach (sitting EOB) Nurse Communication: Mobility status PT Visit Diagnosis: Other abnormalities of gait and mobility (R26.89);Difficulty in walking, not elsewhere classified (R26.2)     Time: 6160-7371 PT Time Calculation (min) (ACUTE ONLY): 16 min  Charges:  $Gait Training: 8-22 mins                     Marisa Severin, PT, DPT Acute Rehabilitation Services Pager [phone] Office [place] 02/11/2021, 10:10 AM

## 2021-02-12 ENCOUNTER — Ambulatory Visit: Payer: Medicare Other | Admitting: Cardiovascular Disease

## 2021-02-12 ENCOUNTER — Other Ambulatory Visit (HOSPITAL_COMMUNITY): Payer: Self-pay

## 2021-02-12 DIAGNOSIS — R32 Unspecified urinary incontinence: Secondary | ICD-10-CM

## 2021-02-12 DIAGNOSIS — R911 Solitary pulmonary nodule: Secondary | ICD-10-CM

## 2021-02-12 DIAGNOSIS — M7918 Myalgia, other site: Secondary | ICD-10-CM

## 2021-02-12 DIAGNOSIS — R197 Diarrhea, unspecified: Secondary | ICD-10-CM | POA: Diagnosis not present

## 2021-02-12 DIAGNOSIS — Z9889 Other specified postprocedural states: Secondary | ICD-10-CM

## 2021-02-12 DIAGNOSIS — I700 Atherosclerosis of aorta: Secondary | ICD-10-CM | POA: Insufficient documentation

## 2021-02-12 DIAGNOSIS — B957 Other staphylococcus as the cause of diseases classified elsewhere: Secondary | ICD-10-CM | POA: Diagnosis not present

## 2021-02-12 DIAGNOSIS — R509 Fever, unspecified: Secondary | ICD-10-CM | POA: Diagnosis not present

## 2021-02-12 LAB — PROTEIN ELECTROPHORESIS, SERUM
A/G Ratio: 0.9 (ref 0.7–1.7)
Albumin ELP: 2.9 g/dL (ref 2.9–4.4)
Alpha-1-Globulin: 0.3 g/dL (ref 0.0–0.4)
Alpha-2-Globulin: 0.6 g/dL (ref 0.4–1.0)
Beta Globulin: 1 g/dL (ref 0.7–1.3)
Gamma Globulin: 1.1 g/dL (ref 0.4–1.8)
Globulin, Total: 3.1 g/dL (ref 2.2–3.9)
Total Protein ELP: 6 g/dL (ref 6.0–8.5)

## 2021-02-12 LAB — CULTURE, BLOOD (ROUTINE X 2): Special Requests: ADEQUATE

## 2021-02-12 LAB — GLUCOSE, CAPILLARY: Glucose-Capillary: 113 mg/dL — ABNORMAL HIGH (ref 70–99)

## 2021-02-12 LAB — PROTIME-INR
INR: 2.5 — ABNORMAL HIGH (ref 0.8–1.2)
Prothrombin Time: 27.3 seconds — ABNORMAL HIGH (ref 11.4–15.2)

## 2021-02-12 MED ORDER — WARFARIN SODIUM 10 MG PO TABS: ORAL_TABLET | ORAL | Status: DC

## 2021-02-12 MED ORDER — DILTIAZEM HCL ER COATED BEADS 240 MG PO CP24
240.0000 mg | ORAL_CAPSULE | Freq: Every day | ORAL | 0 refills | Status: DC
Start: 2021-02-12 — End: 2021-03-15
  Filled 2021-02-12: qty 30, 30d supply, fill #0

## 2021-02-12 MED ORDER — OXYCODONE HCL 5 MG PO TABS
5.0000 mg | ORAL_TABLET | Freq: Four times a day (QID) | ORAL | 0 refills | Status: DC | PRN
  Filled 2021-02-12: qty 10, 3d supply, fill #0

## 2021-02-12 MED ORDER — WARFARIN SODIUM 7.5 MG PO TABS: 15.0000 mg | ORAL_TABLET | Freq: Once | ORAL | Status: DC

## 2021-02-12 MED ORDER — METOPROLOL SUCCINATE ER 25 MG PO TB24
25.0000 mg | ORAL_TABLET | Freq: Every day | ORAL | 0 refills | Status: DC
  Filled 2021-02-12: qty 30, 30d supply, fill #0

## 2021-02-12 MED ORDER — CYCLOBENZAPRINE HCL 10 MG PO TABS
10.0000 mg | ORAL_TABLET | Freq: Three times a day (TID) | ORAL | 0 refills | Status: DC | PRN
  Filled 2021-02-12: qty 30, 10d supply, fill #0

## 2021-02-12 NOTE — Discharge Instructions (Signed)
Check you temperature morning and evening and call PCP if 3 readings persistently > 100.5.   You were cared for by a hospitalist during your hospital stay. If you have any questions about your discharge medications or the care you received while you were in the hospital after you are discharged, you can call the unit and asked to speak with the hospitalist on call if the hospitalist that took care of you is not available. Once you are discharged, your primary care physician will handle any further medical issues.   Please note that NO REFILLS for any discharge medications will be authorized once you are discharged, as it is imperative that you return to your primary care physician (or establish a relationship with a primary care physician if you do not have one) for your aftercare needs so that they can reassess your need for medications and monitor your lab values.  Please take all your medications with you for your next visit with your Primary MD. Please ask your Primary MD to get all Hospital records sent to his/her office. Please request your Primary MD to go over all hospital test results at the follow up.   If you experience worsening of your admission symptoms, develop shortness of breath, chest pain, suicidal or homicidal thoughts or a life threatening emergency, you must seek medical attention immediately by calling 911 or calling your MD.   Dennis Bast must read the complete instructions/literature along with all the possible adverse reactions/side effects for all the medicines you take including new medications that have been prescribed to you. Take new medicines after you have completely understood and accpet all the possible adverse reactions/side effects.    Do not drive when taking pain medications or sedatives.     Do not take more than prescribed Pain, Sleep and Anxiety Medications   If you have smoked or chewed Tobacco in the last 2 yrs please stop. Stop any regular alcohol  and or  recreational drug use.   Wear Seat belts while driving.

## 2021-02-12 NOTE — Progress Notes (Signed)
Subjective:  Pt concerned about her blood culture results  Antibiotics:  Anti-infectives (From admission, onward)   None      Medications: Scheduled Meds: . diltiazem  240 mg Oral Daily  . gabapentin  100 mg Oral QHS  . insulin aspart  0-9 Units Subcutaneous TID WC  . memantine  5 mg Oral BID  . metoprolol tartrate  25 mg Oral BID  . mirabegron ER  25 mg Oral Daily  . pantoprazole  40 mg Oral Daily  . warfarin  15 mg Oral ONCE-1600  . Warfarin - Pharmacist Dosing Inpatient   Does not apply q1600   Continuous Infusions: PRN Meds:.acetaminophen, cyclobenzaprine, ondansetron **OR** ondansetron (ZOFRAN) IV, oxyCODONE    Objective: Weight change: -0.5 kg  Intake/Output Summary (Last 24 hours) at 02/12/2021 0939 Last data filed at 02/12/2021 0235 Gross per 24 hour  Intake 0 ml  Output --  Net 0 ml   Blood pressure 109/70, pulse 93, temperature 98.7 F (37.1 C), temperature source Oral, resp. rate 18, height 5\' 2"  (1.575 m), weight 80.1 kg, SpO2 97 %. Temp:  [98.7 F (37.1 C)-99.1 F (37.3 C)] 98.7 F (37.1 C) (04/18 0857) Pulse Rate:  [65-97] 93 (04/18 0857) Resp:  [16-18] 18 (04/18 0857) BP: (90-116)/(60-70) 109/70 (04/18 0857) SpO2:  [93 %-98 %] 97 % (04/18 0857) Weight:  [80.1 kg] 80.1 kg (04/18 0512)  Physical Exam: Physical Exam Constitutional:      General: She is not in acute distress.    Appearance: She is well-developed. She is not diaphoretic.  HENT:     Head: Normocephalic and atraumatic.     Right Ear: External ear normal.     Left Ear: External ear normal.     Mouth/Throat:     Pharynx: No oropharyngeal exudate.  Eyes:     General: No scleral icterus.    Extraocular Movements: Extraocular movements intact.     Conjunctiva/sclera: Conjunctivae normal.  Cardiovascular:     Rate and Rhythm: Normal rate and regular rhythm.     Heart sounds: Murmur heard.  No friction rub. No gallop.   Pulmonary:     Effort: Pulmonary effort is  normal. No respiratory distress.     Breath sounds: Normal breath sounds. No wheezing or rales.  Abdominal:     General: Bowel sounds are normal. There is no distension.     Palpations: Abdomen is soft.     Tenderness: There is no abdominal tenderness.  Musculoskeletal:        General: Normal range of motion.     Right hip: Normal.     Left hip: Tenderness present. No deformity, lacerations or bony tenderness. Normal range of motion.  Lymphadenopathy:     Cervical: No cervical adenopathy.  Skin:    General: Skin is warm and dry.     Coloration: Skin is not pale.     Findings: No erythema or rash.  Neurological:     General: No focal deficit present.     Mental Status: She is alert and oriented to person, place, and time.     Motor: No abnormal muscle tone.     Coordination: Coordination normal.  Psychiatric:        Mood and Affect: Mood normal.        Behavior: Behavior normal.        Thought Content: Thought content normal.        Judgment: Judgment normal.  CBC:    BMET No results for input(s): NA, K, CL, CO2, GLUCOSE, BUN, CREATININE, CALCIUM in the last 72 hours.   Liver Panel  No results for input(s): PROT, ALBUMIN, AST, ALT, ALKPHOS, BILITOT, BILIDIR, IBILI in the last 72 hours.     Sedimentation Rate No results for input(s): ESRSEDRATE in the last 72 hours. C-Reactive Protein No results for input(s): CRP in the last 72 hours.  Micro Results: Recent Results (from the past 720 hour(s))  Blood culture (routine x 2)     Status: Abnormal   Collection Time: 02/05/21  6:15 PM   Specimen: BLOOD  Result Value Ref Range Status   Specimen Description BLOOD SITE NOT SPECIFIED  Final   Special Requests   Final    BOTTLES DRAWN AEROBIC AND ANAEROBIC Blood Culture results may not be optimal due to an inadequate volume of blood received in culture bottles   Culture  Setup Time   Final    GRAM POSITIVE COCCI IN BOTH AEROBIC AND ANAEROBIC BOTTLES CRITICAL RESULT  CALLED TO, READ BACK BY AND VERIFIED WITH: PHARMD T. DANG [phone] [id number] FCP    Culture (A)  Final    STAPHYLOCOCCUS HOMINIS THE SIGNIFICANCE OF ISOLATING THIS ORGANISM FROM A SINGLE SET OF BLOOD CULTURES WHEN MULTIPLE SETS ARE DRAWN IS UNCERTAIN. PLEASE NOTIFY THE MICROBIOLOGY DEPARTMENT WITHIN ONE WEEK IF SPECIATION AND SENSITIVITIES ARE REQUIRED. Performed at [facility], [place] [address], [place], [place] [address]    Report Status 02/08/2021 FINAL  Final  Blood Culture ID Panel (Reflexed)     Status: Abnormal   Collection Time: 02/05/21  6:15 PM  Result Value Ref Range Status   Enterococcus faecalis NOT DETECTED NOT DETECTED Final   Enterococcus Faecium NOT DETECTED NOT DETECTED Final   Listeria monocytogenes NOT DETECTED NOT DETECTED Final   Staphylococcus species DETECTED (A) NOT DETECTED Final    Comment: CRITICAL RESULT CALLED TO, READ BACK BY AND VERIFIED WITH: PHARMD T. DANG [phone] [id number] FCP    Staphylococcus aureus (BCID) NOT DETECTED NOT DETECTED Final   Staphylococcus epidermidis NOT DETECTED NOT DETECTED Final   Staphylococcus lugdunensis NOT DETECTED NOT DETECTED Final   Streptococcus species NOT DETECTED NOT DETECTED Final   Streptococcus agalactiae NOT DETECTED NOT DETECTED Final   Streptococcus pneumoniae NOT DETECTED NOT DETECTED Final   Streptococcus pyogenes NOT DETECTED NOT DETECTED Final   A.calcoaceticus-baumannii NOT DETECTED NOT DETECTED Final   Bacteroides fragilis NOT DETECTED NOT DETECTED Final   Enterobacterales NOT DETECTED NOT DETECTED Final   Enterobacter cloacae complex NOT DETECTED NOT DETECTED Final   Escherichia coli NOT DETECTED NOT DETECTED Final   Klebsiella aerogenes NOT DETECTED NOT DETECTED Final   Klebsiella oxytoca NOT DETECTED NOT DETECTED Final   Klebsiella pneumoniae NOT DETECTED NOT DETECTED Final   Proteus species NOT DETECTED NOT DETECTED Final   Salmonella species NOT DETECTED NOT DETECTED Final   Serratia marcescens NOT  DETECTED NOT DETECTED Final   Haemophilus influenzae NOT DETECTED NOT DETECTED Final   Neisseria meningitidis NOT DETECTED NOT DETECTED Final   Pseudomonas aeruginosa NOT DETECTED NOT DETECTED Final   Stenotrophomonas maltophilia NOT DETECTED NOT DETECTED Final   Candida albicans NOT DETECTED NOT DETECTED Final   Candida auris NOT DETECTED NOT DETECTED Final   Candida glabrata NOT DETECTED NOT DETECTED Final   Candida krusei NOT DETECTED NOT DETECTED Final   Candida parapsilosis NOT DETECTED NOT DETECTED Final   Candida tropicalis NOT DETECTED NOT DETECTED Final   Cryptococcus  neoformans/gattii NOT DETECTED NOT DETECTED Final    Comment: Performed at [facility], [place] [address] [place], [place] [address]  Blood culture (routine x 2)     Status: None   Collection Time: 02/05/21  7:17 PM   Specimen: BLOOD  Result Value Ref Range Status   Specimen Description BLOOD SITE NOT SPECIFIED  Final   Special Requests   Final    BOTTLES DRAWN AEROBIC AND ANAEROBIC Blood Culture adequate volume   Culture   Final    NO GROWTH 5 DAYS Performed at [facility], [address] [place], [place] [address]    Report Status 02/10/2021 FINAL  Final  Resp Panel by RT-PCR (Flu A&B, Covid) Nasopharyngeal Swab     Status: None   Collection Time: 02/05/21 10:30 PM   Specimen: Nasopharyngeal Swab; Nasopharyngeal(NP) swabs in vial transport medium  Result Value Ref Range Status   SARS Coronavirus 2 by RT PCR NEGATIVE NEGATIVE Final    Comment: (NOTE) SARS-CoV-2 target nucleic acids are NOT DETECTED.  The SARS-CoV-2 RNA is generally detectable in upper respiratory specimens during the acute phase of infection. The lowest concentration of SARS-CoV-2 viral copies this assay can detect is 138 copies/mL. A negative result does not preclude SARS-Cov-2 infection and should not be used as the sole basis for treatment or other patient management decisions. A negative result may occur with  improper  specimen collection/handling, submission of specimen other than nasopharyngeal swab, presence of viral mutation(s) within the areas targeted by this assay, and inadequate number of viral copies(<138 copies/mL). A negative result must be combined with clinical observations, patient history, and epidemiological information. The expected result is Negative.  Fact Sheet for Patients:  EntrepreneurPulse.com.au  Fact Sheet for Healthcare Providers:  IncredibleEmployment.be  This test is no t yet approved or cleared by the [place] FDA and  has been authorized for detection and/or diagnosis of SARS-CoV-2 by FDA under an Emergency Use Authorization (EUA). This EUA will remain  in effect (meaning this test can be used) for the duration of the COVID-19 declaration under Section 564(b)(1) of the Act, 21 U.S.C.section 360bbb-3(b)(1), unless the authorization is terminated  or revoked sooner.       Influenza A by PCR NEGATIVE NEGATIVE Final   Influenza B by PCR NEGATIVE NEGATIVE Final    Comment: (NOTE) The Xpert Xpress SARS-CoV-2/FLU/RSV plus assay is intended as an aid in the diagnosis of influenza from Nasopharyngeal swab specimens and should not be used as a sole basis for treatment. Nasal washings and aspirates are unacceptable for Xpert Xpress SARS-CoV-2/FLU/RSV testing.  Fact Sheet for Patients: EntrepreneurPulse.com.au  Fact Sheet for Healthcare Providers: IncredibleEmployment.be  This test is not yet approved or cleared by the [place] FDA and has been authorized for detection and/or diagnosis of SARS-CoV-2 by FDA under an Emergency Use Authorization (EUA). This EUA will remain in effect (meaning this test can be used) for the duration of the COVID-19 declaration under Section 564(b)(1) of the Act, 21 U.S.C. section 360bbb-3(b)(1), unless the authorization is terminated or revoked.  Performed at  [facility], [place] [address], [place], [place] [address]   Culture, blood (routine x 2)     Status: None   Collection Time: 02/06/21  6:50 PM   Specimen: BLOOD RIGHT WRIST  Result Value Ref Range Status   Specimen Description BLOOD RIGHT WRIST  Final   Special Requests AEROBIC BOTTLE ONLY Blood Culture adequate volume  Final   Culture  Final    NO GROWTH 5 DAYS Performed at [facility], [place] [address] [place], [place] [address]    Report Status 02/11/2021 FINAL  Final  Culture, blood (routine x 2)     Status: None   Collection Time: 02/06/21  7:06 PM   Specimen: BLOOD RIGHT WRIST  Result Value Ref Range Status   Specimen Description BLOOD RIGHT WRIST  Final   Special Requests AEROBIC BOTTLE ONLY Blood Culture adequate volume  Final   Culture   Final    NO GROWTH 5 DAYS Performed at [facility], [place] [address] [place], [place] [address]    Report Status 02/11/2021 FINAL  Final  Culture, blood (routine x 2)     Status: None (Preliminary result)   Collection Time: 02/08/21  9:55 PM   Specimen: BLOOD LEFT HAND  Result Value Ref Range Status   Specimen Description BLOOD LEFT HAND  Final   Special Requests   Final    BOTTLES DRAWN AEROBIC AND ANAEROBIC Blood Culture adequate volume   Culture   Final    NO GROWTH 4 DAYS Performed at [facility], [place] [address], [place], [place] [address]    Report Status PENDING  Incomplete  Culture, blood (routine x 2)     Status: Abnormal   Collection Time: 02/08/21 10:04 PM   Specimen: BLOOD RIGHT HAND  Result Value Ref Range Status   Specimen Description BLOOD RIGHT HAND  Final   Special Requests   Final    BOTTLES DRAWN AEROBIC AND ANAEROBIC Blood Culture adequate volume   Culture  Setup Time   Final    AEROBIC BOTTLE ONLY GRAM POSITIVE COCCI CRITICAL VALUE NOTED.  VALUE IS CONSISTENT WITH PREVIOUSLY REPORTED AND CALLED VALUE. Performed at [facility], [place] [address], [place], [place] [address]     Culture STAPHYLOCOCCUS WARNERI (A)  Final   Report Status 02/12/2021 FINAL  Final   Organism ID, Bacteria STAPHYLOCOCCUS WARNERI  Final      Susceptibility   Staphylococcus warneri - MIC*    CIPROFLOXACIN <=0.5 SENSITIVE Sensitive     ERYTHROMYCIN >=8 RESISTANT Resistant     GENTAMICIN <=0.5 SENSITIVE Sensitive     OXACILLIN >=4 RESISTANT Resistant     TETRACYCLINE <=1 SENSITIVE Sensitive     VANCOMYCIN <=0.5 SENSITIVE Sensitive     TRIMETH/SULFA <=10 SENSITIVE Sensitive     CLINDAMYCIN <=0.25 SENSITIVE Sensitive     RIFAMPIN <=0.5 SENSITIVE Sensitive     Inducible Clindamycin NEGATIVE Sensitive     * STAPHYLOCOCCUS WARNERI  Culture, blood (routine x 2)     Status: None (Preliminary result)   Collection Time: 02/11/21  7:50 AM   Specimen: BLOOD  Result Value Ref Range Status   Specimen Description BLOOD LEFT ANTECUBITAL  Final   Special Requests   Final    BOTTLES DRAWN AEROBIC ONLY Blood Culture adequate volume   Culture   Final    NO GROWTH < 24 HOURS Performed at [facility], [address], [place], [place] [address]    Report Status PENDING  Incomplete  Culture, blood (routine x 2)     Status: None (Preliminary result)   Collection Time: 02/11/21  7:51 AM   Specimen: BLOOD  Result Value Ref Range Status   Specimen Description BLOOD RIGHT ANTECUBITAL  Final   Special Requests   Final    BOTTLES DRAWN AEROBIC ONLY Blood Culture results may not be optimal due  to an excessive volume of blood received in culture bottles   Culture   Final    NO GROWTH < 24 HOURS Performed at [place] [address] [place], [place] [address]    Report Status PENDING  Incomplete    Studies/Results: ECHOCARDIOGRAM COMPLETE  Result Date: 02/10/2021    ECHOCARDIOGRAM REPORT   Patient Name:   LILIENNE WEINS Date of Exam: 02/10/2021 Medical Rec #:  [id number]       Height:       62.0 in Accession #:    [id number]      Weight:       176.2 lb Date of Birth:  [birth year]      BSA:           1.811 m Patient Age:    21 years        BP:           107/65 mmHg Patient Gender: F               HR:           95 bpm. Exam Location:  Inpatient Procedure: 2D Echo, Cardiac Doppler, Color Doppler and Intracardiac            Opacification Agent Indications:    Fever                 S/P mitral valve replacement  History:        Patient has prior history of Echocardiogram examinations, most                 recent 05/10/2020. CHF, Arrythmias:Atrial Fibrillation; Risk                 Factors:Hypertension and Diabetes. S/p mechanical mitral valve                 replacement (St. Jude mechanical valve).  Sonographer:    Clayton Lefort RDCS (AE) Referring Phys: [id number] [place]  1. Left ventricular ejection fraction, by estimation, is 55%%. The left ventricle has low normal function. The left ventricle has no regional wall motion abnormalities. There is mild left ventricular hypertrophy. Left ventricular diastolic parameters are indeterminate.  2. Right ventricular systolic function is low normal. The right ventricular size is normal. There is moderately elevated pulmonary artery systolic pressure.  3. Left atrial size was severely dilated.  4. Right atrial size was severely dilated.  5. MV prosthesis (St. Jude mechanical) is well seated, appears to open well. mean gradient through the valve is approximately 4 mm Hg. . The mitral valve has been repaired/replaced. Trivial mitral valve regurgitation.  6. TR is eccentric, directed toward the interatrial septum.. Tricuspid valve regurgitation is moderate to severe.  7. The aortic valve is tricuspid. Aortic valve regurgitation is not visualized. Mild to moderate aortic valve sclerosis/calcification is present, without any evidence of aortic stenosis.  8. Aortic dilatation noted. There is mild dilatation of the ascending aorta, measuring 40 mm.  9. The inferior vena cava is dilated in size with <50% respiratory variability, suggesting right atrial  pressure of 15 mmHg. Comparison(s): The left ventricular function No significant change. FINDINGS  Left Ventricle: Left ventricular ejection fraction, by estimation, is 55%%. The left ventricle has low normal function. The left ventricle has no regional wall motion abnormalities. Definity contrast agent was given IV to delineate the left ventricular endocardial borders. The left ventricular internal cavity size was normal in size. There is mild left  ventricular hypertrophy. Left ventricular diastolic parameters are indeterminate. Right Ventricle: The right ventricular size is normal. Right vetricular wall thickness was not assessed. Right ventricular systolic function is low normal. There is moderately elevated pulmonary artery systolic pressure. The tricuspid regurgitant velocity is 3.07 m/s, and with an assumed right atrial pressure of 15 mmHg, the estimated right ventricular systolic pressure is 93.7 mmHg. Left Atrium: Left atrial size was severely dilated. Right Atrium: Right atrial size was severely dilated. Pericardium: Trivial pericardial effusion is present. 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 mean gradient through the valve is approximately 4 mm Hg. The mitral valve has been repaired/replaced. Trivial mitral valve regurgitation. There is a St. Jude mechanical valve present in the mitral position. The mean mitral valve gradient is 4.0 mmHg. Tricuspid Valve: TR is eccentric, directed toward the interatrial septum. The tricuspid valve is normal in structure. Tricuspid valve regurgitation is moderate to severe. Aortic Valve: The aortic valve is tricuspid. Aortic valve regurgitation is not visualized. Mild to moderate aortic valve sclerosis/calcification is present, without any evidence of aortic stenosis. Aortic valve mean gradient measures 5.4 mmHg. Aortic valve peak gradient measures 9.4 mmHg. Aortic valve area, by VTI measures 1.38 cm. Pulmonic Valve: The  pulmonic valve was normal in structure. Pulmonic valve regurgitation is not visualized. Aorta: The aortic root is normal in size and structure and aortic dilatation noted. There is mild dilatation of the ascending aorta, measuring 40 mm. Venous: The inferior vena cava is dilated in size with less than 50% respiratory variability, suggesting right atrial pressure of 15 mmHg. IAS/Shunts: No atrial level shunt detected by color flow Doppler.  LEFT VENTRICLE PLAX 2D LVIDd:         3.90 cm LVIDs:         2.70 cm LV PW:         1.70 cm LV IVS:        1.40 cm LVOT diam:     1.90 cm LV SV:         36 LV SV Index:   20 LVOT Area:     2.84 cm  LV Volumes (MOD) LV vol d, MOD A2C: 78.1 ml LV vol d, MOD A4C: 69.7 ml LV vol s, MOD A2C: 49.2 ml LV vol s, MOD A4C: 42.3 ml LV SV MOD A2C:     28.9 ml LV SV MOD A4C:     69.7 ml LV SV MOD BP:      28.1 ml RIGHT VENTRICLE            IVC RV Basal diam:  3.40 cm    IVC diam: 2.50 cm RV S prime:     8.38 cm/s TAPSE (M-mode): 1.2 cm LEFT ATRIUM              Index        RIGHT ATRIUM           Index LA diam:        5.20 cm  2.87 cm/m   RA Area:     35.80 cm LA Vol (A2C):   288.0 ml 158.99 ml/m RA Volume:   122.00 ml 67.35 ml/m LA Vol (A4C):   189.0 ml 104.33 ml/m LA Biplane Vol: 238.0 ml 131.38 ml/m  AORTIC VALVE AV Area (Vmax):    1.33 cm AV Area (Vmean):   1.29 cm AV Area (VTI):     1.38 cm AV Vmax:  153.26 cm/s AV Vmean:          105.920 cm/s AV VTI:            0.262 m AV Peak Grad:      9.4 mmHg AV Mean Grad:      5.4 mmHg LVOT Vmax:         72.00 cm/s LVOT Vmean:        48.267 cm/s LVOT VTI:          0.127 m LVOT/AV VTI ratio: 0.49  AORTA Ao Root diam: 3.20 cm Ao Asc diam:  4.00 cm MITRAL VALVE           TRICUSPID VALVE MV Mean grad: 4.0 mmHg TR Peak grad:   37.7 mmHg                        TR Vmax:        307.00 cm/s                         SHUNTS                        Systemic VTI:  0.13 m                        Systemic Diam: 1.90 cm Dorris Carnes MD Electronically  signed by Dorris Carnes MD Signature Date/Time: 02/10/2021/5:02:11 PM    Final       Assessment/Plan:  INTERVAL HISTORY:    Fevers largely defervesced  since I last saw her  Principal Problem:   Atrial fibrillation with RVR (HCC) Active Problems:   DM2 (diabetes mellitus, type 2) (HCC)   SIRS (systemic inflammatory response syndrome) (HCC)   Chronic kidney disease, stage 3a (HCC)   Diarrhea   Chronic diastolic CHF (congestive heart failure) (HCC)   Lung nodules   Acute febrile illness    Beth Miller is a 80 y.o. female with past medical history significant for TAVR, atrial fibrillation with rapid ventricular response diabetes mellitus diastolic heart failure who was admitted with fever and loose stools.  CT abdomen pelvis was unrevealing C. difficile testing was negative.  Blood cultures  Initially revealed a Staph hominis in 1 of 2 sites.  She then  continued to had fevers now much higher with Tylenol not having been given today.  TTE without vegetations MRI hip unrevealing  CT chest abdomen and pelvis without new finding  Repeat blood culture growing another contaminant Staph warneri in 1/2 sites from 14th and other one negative, and repeat cxs negative  #1 fever unknown origin:  See above re workup  She has only had low grade to 99 ish fevers over weekend. No high fevers since 14th  #2 Staph hominis in blood culture 1/2 sites, Staph warneri in 1/2 both contaminants  I spent greater than 35  minutes with the patient including greater than 50% of time in face to face counsel of the patient re FUO, blood cultures, re microbiology of her contaminants nd in coordination of her care.  From an ID standpoint she can be DC  I will sign off for now.   LOS: 7 days   Alcide Evener 02/12/2021, 9:39 AM

## 2021-02-12 NOTE — Progress Notes (Signed)
Mobility Specialist - Progress Note   02/12/21 1109  Mobility  Activity Ambulated in hall  Level of Assistance Standby assist, set-up cues, supervision of patient - no hands on  Assistive Device None  Distance Ambulated (ft) 120 ft  Mobility Response Tolerated well  Mobility performed by Mobility specialist  $Mobility charge 1 Mobility   Pt stated her back pain is improved compared to the past few days. HR remained in 90s throughout. Pt sitting on edge of bed after walk to eat breakfast.  [place] Specialist Mobility Specialist Phone: [phone]

## 2021-02-12 NOTE — Progress Notes (Signed)
Pt/family given discharge instructions, medication lists, follow up appointments, and when to call the doctor.  Pt/family verbalizes understanding. Tejuan Gholson McClintock, RN   

## 2021-02-12 NOTE — Discharge Summary (Incomplete)
Physician Discharge Summary  Beth Miller IRJ:[id number] DOB: 1941/10/28 DOA: 02/05/2021  PCP: Cassandria Anger, MD  Admit date: 02/05/2021 Discharge date: 02/12/2021  Admitted From: ***  Disposition:  ***   Recommendations for Outpatient Follow-up:  1. INR check on Monday- I have asked [facility] for an appt.  2. Patient knows to check temps BID at home and call PCP if > 100.5  Home Health:  She declines home healh  Discharge Condition:  ***   CODE STATUS:  ***   Diet recommendation:  *** Consultations:  ***  Procedures/Studies: . ***   Discharge Diagnoses:  Principal Problem:   Atrial fibrillation with RVR ([place]) Active Problems:   DM2 (diabetes mellitus, type 2) (HCC)   SIRS (systemic inflammatory response syndrome) (HCC)   Chronic kidney disease, stage 3a (HCC)   Diarrhea   Chronic diastolic CHF (congestive heart failure) (HCC)   Lung nodules   Acute febrile illness     Brief Summary: ***  Hospital Course:  ***   Discharge Exam: Vitals:   02/12/21 0536 02/12/21 0857  BP: 104/67 109/70  Pulse: 92 93  Resp: 16 18  Temp: 99.1 F (37.3 C) 98.7 F (37.1 C)  SpO2: 96% 97%   Vitals:   02/11/21 2315 02/12/21 0512 02/12/21 0536 02/12/21 0857  BP: 116/70  104/67 109/70  Pulse: 94  92 93  Resp: _0 Temp: 99.1 F (37.3 C)  99.1 F (37.3 C) 98.7 F (37.1 C)  TempSrc: Oral  Oral Oral  SpO2: 95%  96% 97%  Weight:  80.1 kg    Height:        General: Pt is alert, awake, not in acute distress Cardiovascular: RRR, S1/S2 +, no rubs, no gallops Respiratory: CTA bilaterally, no wheezing, no rhonchi Abdominal: Soft, NT, ND, bowel sounds + Extremities: no edema, no cyanosis   Discharge Instructions   Allergies as of 02/12/2021      Reactions   Aricept [donepezil Hcl]    Intolerance - bad dreams   Food    Bananas and Fish, cause sinus drainage   Furosemide    cramps   Quinine Other (See Comments)   Can't hear   Spironolactone     constipation   Tramadol    headache   Sulfadiazine Itching, Rash      Medication List    TAKE these medications   Accu-Chek Aviva Plus test strip Generic drug: glucose blood TEST BLOOD SUGAR TWICE DAILY   Accu-Chek Aviva Plus w/Device Kit 1 each by Does not apply route 2 (two) times daily. Use as directed   Accu-Chek Aviva Soln 1 each by In Vitro route as directed.   accu-chek softclix lancets 1 each by Other route 2 (two) times daily. Use as instructed   Accu-Chek Softclix Lancets lancets 1 each by Other route 2 (two) times daily. Use to check blood sugars twice a day   acetaminophen 500 MG tablet Commonly known as: TYLENOL Take 1,000 mg by mouth every 6 (six) hours as needed for mild pain.   amoxicillin 500 MG tablet Commonly known as: AMOXIL Take four capsules (2,000 mg) one hour prior to all dental visits. What changed:   how much to take  how to take this  when to take this   B-D SINGLE USE SWABS REGULAR Pads 1 each by Does not apply route 2 (two) times daily. Use to clean finger to check blood sugars twice a day   cholecalciferol 25 MCG (1000  UNIT) tablet Commonly known as: VITAMIN D Take 1,000 Units by mouth daily.   cyclobenzaprine 10 MG tablet Commonly known as: FLEXERIL Take 1 tablet (10 mg total) by mouth 3 (three) times daily as needed for muscle spasms.   diltiazem 240 MG 24 hr capsule Commonly known as: CARDIZEM CD Take 1 capsule (240 mg total) by mouth daily. What changed:   medication strength  how much to take  additional instructions   furosemide 20 MG tablet Commonly known as: LASIX Take 1 tablet (20 mg total) by mouth daily. Take in am   gabapentin 100 MG capsule Commonly known as: NEURONTIN Take 1 capsule (100 mg total) by mouth at bedtime.   HYDROcodone-acetaminophen 5-325 MG tablet Commonly known as: NORCO/VICODIN Take 0.5-1 tablets by mouth every 6 (six) hours as needed for severe pain.   loratadine 10 MG  tablet Commonly known as: CLARITIN Take 1 tablet (10 mg total) by mouth daily as needed for allergies. Take in am   memantine 5 MG tablet Commonly known as: NAMENDA Take 1 tablet (5 mg total) by mouth 2 (two) times daily.   metFORMIN 500 MG tablet Commonly known as: GLUCOPHAGE Take 1 tablet (500 mg total) by mouth daily with breakfast.   metoprolol succinate 25 MG 24 hr tablet Commonly known as: Toprol XL Take 1 tablet (25 mg total) by mouth daily.   mirabegron ER 25 MG Tb24 tablet Commonly known as: Myrbetriq Take 1 tablet (25 mg total) by mouth daily.   ondansetron 4 MG tablet Commonly known as: ZOFRAN Take 1 tablet (4 mg total) by mouth every 8 (eight) hours as needed for nausea or vomiting.   OVER THE COUNTER MEDICATION Take 1 tablet by mouth as needed (joint pain).   oxyCODONE 5 MG immediate release tablet Commonly known as: Oxy IR/ROXICODONE Take 1 tablet (5 mg total) by mouth every 6 (six) hours as needed for severe pain.   pantoprazole 40 MG tablet Commonly known as: PROTONIX Take 1 tablet (40 mg total) by mouth daily. Take in am   potassium chloride 10 MEQ tablet Commonly known as: KLOR-CON Take 1 tablet (10 mEq total) by mouth daily.   vitamin B-12 1000 MCG tablet Commonly known as: CYANOCOBALAMIN Take 1,000 mcg by mouth daily.   warfarin 10 MG tablet Commonly known as: COUMADIN Take as directed. If you are unsure how to take this medication, talk to your nurse or doctor. Original instructions: Please take 15 mg daily and [facility]. What changed:   medication strength  additional instructions  Another medication with the same name was removed. Continue taking this medication, and follow the directions you see here.       Allergies  Allergen Reactions  . Aricept [Donepezil Hcl]     Intolerance - bad dreams  . Food     Bananas and Fish, cause sinus drainage  . Furosemide     cramps  . Quinine Other  (See Comments)    Can't hear  . Spironolactone     constipation  . Tramadol     headache  . Sulfadiazine Itching and Rash      MR HIP LEFT W WO CONTRAST  Result Date: 02/09/2021 CLINICAL DATA:  Left hip and gluteal pain. Fever. Question osteomyelitis. EXAM: MRI OF THE LEFT HIP WITHOUT AND WITH CONTRAST TECHNIQUE: Multiplanar, multisequence MR imaging was performed both before and after administration of intravenous contrast. CONTRAST:  7.5 mL GADAVIST IV SOLN COMPARISON:  CT abdomen and pelvis 02/05/2021 in 12/20/2016. FINDINGS: Bones: There is no marrow edema or enhancement to suggest osteomyelitis. No fracture, stress change or worrisome lesion. Markedly T1 and T2 hypointense lesion in the right acetabulum is consistent with a bone island and stable since 2018. No avascular necrosis of the femoral heads. No subchondral cyst formation or edema about the hips. Articular cartilage and labrum Articular cartilage:  Mildly thinned without focal defect. Labrum:  The superior labrum is degenerated and frayed. Joint or bursal effusion Joint effusion:  Small left hip joint effusion. Bursae: Negative. Muscles and tendons Muscles and tendons:  Intact. Other findings Miscellaneous: Imaged intrapelvic contents demonstrate no acute or focal abnormality. The patient is status post hysterectomy. IMPRESSION: Negative for osteomyelitis or acute bony abnormality. Small left hip joint effusion is likely related to degenerative change although it cannot be definitively characterized. Electronically Signed   By: Inge Rise M.D.   On: 02/09/2021 15:18   CT Abdomen Pelvis W Contrast  Result Date: 02/05/2021 CLINICAL DATA:  Left-sided back and abdominal pain EXAM: CT ABDOMEN AND PELVIS WITH CONTRAST TECHNIQUE: Multidetector CT imaging of the abdomen and pelvis was performed using the standard protocol following bolus administration of intravenous contrast. CONTRAST:  177m OMNIPAQUE IOHEXOL 300 MG/ML  SOLN  COMPARISON:  CT 09/22/2018 FINDINGS: Lower chest: Lung bases demonstrate no acute consolidation or effusion. Mild fibrosis within the medial right lung base. Cardiomegaly with biatrial enlargement. Mitral valve prosthesis. Hepatobiliary: Status post cholecystectomy. No biliary dilatation. No focal hepatic abnormality Pancreas: Unremarkable. No pancreatic ductal dilatation or surrounding inflammatory changes. Spleen: Normal in size without focal abnormality. Adrenals/Urinary Tract: Adrenal glands are normal. 6.3 cm cyst in the upper pole left kidney. Probable cyst mid right kidney. The urinary bladder is unremarkable. Subcentimeter hypodense renal lesions too small to further characterize. Stomach/Bowel: Stomach is within normal limits. Appendix appears normal. No evidence of bowel wall thickening, distention, or inflammatory changes. Vascular/Lymphatic: Mild aortic atherosclerosis. No aneurysm. No suspicious nodes. Reproductive: Status post hysterectomy. No adnexal masses. Other: Negative for free air or free fluid. Small upper abdominal ventral hernia containing fat. Small amount of fluid in the hernia sac. Musculoskeletal: Stable sclerotic lesion within the right iliac bone measuring 18 mm, felt benign given lack of interval change. IMPRESSION: 1. No CT evidence for acute intra-abdominal or pelvic abnormality. 2. Cardiomegaly with biatrial enlargement. Aortic Atherosclerosis (ICD10-I70.0). Electronically Signed   By: KDonavan FoilM.D.   On: 02/05/2021 21:17   CT CHEST ABDOMEN PELVIS W CONTRAST  Result Date: 02/10/2021 CLINICAL DATA:  Fever of unknown origin EXAM: CT CHEST, ABDOMEN, AND PELVIS WITH CONTRAST TECHNIQUE: Multidetector CT imaging of the chest, abdomen and pelvis was performed following the standard protocol during bolus administration of intravenous contrast. CONTRAST:  1035mOMNIPAQUE IOHEXOL 300 MG/ML  SOLN COMPARISON:  CT abdomen pelvis 09/07/2021, chest radiograph 09/07/2021, CT neck with  contrast 06/18/2019, thyroid ultrasound 10/15/2013 FINDINGS: CT CHEST FINDINGS Cardiovascular: Marked cardiomegaly with postsurgical changes from prior sternotomy and placement of a Saint Jude mitral valve prosthesis. Extensive coronary artery calcification is noted as well. Atherosclerotic plaque within the normal caliber aorta. No acute luminal abnormality of the imaged aorta. No periaortic stranding or hemorrhage. Normal 3 vessel branching of the aortic arch. Proximal great vessels are unremarkable. Central pulmonary arteries are top-normal caliber. No large central filling defects are present within limitations of this non tailored examination of the pulmonary arteries. Mediastinum/Nodes: Postsurgical changes in the anterior mediastinum related to prior sternotomy. No mediastinal fluid or gas.  Heterogeneous and enlarged left thyroid gland with suggestion of multiple underlying thyroid nodules, similar to comparison 06/18/2019. No acute abnormality of the trachea or esophagus. No worrisome mediastinal, hilar or axillary adenopathy. Lungs/Pleura: Low volumes and atelectasis with additional bandlike areas of further atelectasis and/or scarring. Diffuse mild airways thickening and scattered secretions. No consolidative process. No pneumothorax or effusion. No convincing CT features of edema. Few scattered sub 5 mm solid and subsolid nodules seen in the left upper lobe (4/55, 51, 60). Additional solid 5 mm nodule in the right upper lobe adjacent the fissure (4/83). No other concerning nodules or masses. Musculoskeletal: Multilevel degenerative changes are present in the imaged portions of the spine. No acute osseous abnormality or suspicious osseous lesion. Additional degenerative changes in the shoulders prior sternotomy. Bony fusion across the sternal plate. CT ABDOMEN PELVIS FINDINGS Hepatobiliary: No worrisome focal liver lesions. Smooth liver surface contour. Normal hepatic attenuation. Prior cholecystectomy. No  biliary dilatation or intraductal gallstones. Pancreas: No pancreatic ductal dilatation or surrounding inflammatory changes. Spleen: Normal in size. No concerning splenic lesions. Adrenals/Urinary Tract: Normal adrenal glands. Stable mild perinephric stranding, nonspecific. Kidneys are normally located with symmetric enhancementwith symmetrically delayed excretion. Fluid attenuation 5.7 cm cyst in the left kidney. Additional probable 1.4 cm cyst in the interpolar right kidney as well. Additional subcentimeter hypoattenuating foci in both kidneys too small to fully characterize on CT imaging but statistically likely benign. no suspicious renal lesion, urolithiasis or hydronephrosis. Urinary bladder is moderately distended but without other gross abnormality. Stomach/Bowel: Distal esophagus, stomach and duodenal sweep are unremarkable. No small bowel wall thickening or dilatation. A normal appendix is visualized. No colonic dilatation or wall thickening. Moderate stool burden. No evidence of bowel obstruction with high attenuation enteric contrast media traversing to the level of the distal ileum. Vascular/Lymphatic: Atherosclerotic calcifications within the abdominal aorta and branch vessels. No aneurysm or ectasia. No enlarged abdominopelvic lymph nodes. Reproductive: Uterus is surgically absent. No concerning adnexal lesions. No concerning adnexal lesions. Other: Small upper abdominal fat containing ventral hernia anterior to the falciform ligament (3/63) with small amount of fluid in the hernia sac. No bowel containing hernia. No abdominopelvic free air or fluid. Multiple sites of focal soft tissue stranding and punctate foci of soft tissue gas along the anterior abdominal wall likely related to injectable use. Mild body wall edema. Calcified injection granulomata posteriorly as well. Musculoskeletal: Multilevel degenerative changes are present in the imaged portions of the spine. Features most pronounced L4-S1  with some mild interspinous arthrosis as well. Stable sclerotic focus in the right iliac measuring up to 18 mm in size with narrow zone of transition. Appearance and stability favoring benignity. No concerning or acute osseous abnormalities. IMPRESSION: 1. Low volumes and atelectasis with mild airways thickening and scattered secretions. Correlate for features of bronchitis. 2. No other acute intrathoracic process. 3. Few scattered sub 5 mm solid and subsolid nodules in the left upper lobe, nonspecific no can be post infectious or inflammatory. No follow-up needed if patient is low-risk (and has no known or suspected primary neoplasm). Non-contrast chest CT can be considered in 12 months if patient is high-risk. This recommendation follows the consensus statement: Guidelines for Management of Incidental Pulmonary Nodules Detected on CT Images: From the Fleischner Society 2017; Radiology 2017; 284:228-243. 4. Small upper abdominal fat containing ventral hernia anterior to the falciform ligament with small amount of fluid in the hernia sac, unchanged from prior. No bowel containing hernia. 5. Multiple sites of focal soft tissue stranding and  punctate foci of soft tissue gas along the anterior abdominal wall likely related to injectable use. Correlate with visual inspection. 6. No other acute or conspicuous abdominopelvic abnormalities provide cause for patient's symptoms. 7. Prior mitral valvuloplasty. 8. Aortic Atherosclerosis (ICD10-I70.0). Electronically Signed   By: Lovena Le M.D.   On: 02/10/2021 03:04   DG Chest Portable 1 View  Result Date: 02/05/2021 CLINICAL DATA:  Chest pain and tachycardia. EXAM: PORTABLE CHEST 1 VIEW COMPARISON:  November 19, 2020 FINDINGS: Multiple sternal wires are noted. The cardiac silhouette is markedly enlarged and unchanged in size. Both lungs are clear. The visualized skeletal structures are unremarkable. IMPRESSION: Stable cardiomegaly without acute or active cardiopulmonary  disease. Electronically Signed   By: Virgina Norfolk M.D.   On: 02/05/2021 19:37   ECHOCARDIOGRAM COMPLETE  Result Date: 02/10/2021    ECHOCARDIOGRAM REPORT   Patient Name:   SHERAN NEWSTROM Date of Exam: 02/10/2021 Medical Rec #:  [id number]       Height:       62.0 in Accession #:    [id number]      Weight:       176.2 lb Date of Birth:  06-27-1941      BSA:          1.811 m Patient Age:    34 years        BP:           107/65 mmHg Patient Gender: F               HR:           95 bpm. Exam Location:  Inpatient Procedure: 2D Echo, Cardiac Doppler, Color Doppler and Intracardiac            Opacification Agent Indications:    Fever                 S/P mitral valve replacement  History:        Patient has prior history of Echocardiogram examinations, most                 recent 05/10/2020. CHF, Arrythmias:Atrial Fibrillation; Risk                 Factors:Hypertension and Diabetes. S/p mechanical mitral valve                 replacement (St. Jude mechanical valve).  Sonographer:    Clayton Lefort RDCS (AE) Referring Phys: [id number] [place]  1. Left ventricular ejection fraction, by estimation, is 55%%. The left ventricle has low normal function. The left ventricle has no regional wall motion abnormalities. There is mild left ventricular hypertrophy. Left ventricular diastolic parameters are indeterminate.  2. Right ventricular systolic function is low normal. The right ventricular size is normal. There is moderately elevated pulmonary artery systolic pressure.  3. Left atrial size was severely dilated.  4. Right atrial size was severely dilated.  5. MV prosthesis (St. Jude mechanical) is well seated, appears to open well. mean gradient through the valve is approximately 4 mm Hg. . The mitral valve has been repaired/replaced. Trivial mitral valve regurgitation.  6. TR is eccentric, directed toward the interatrial septum.. Tricuspid valve regurgitation is moderate to severe.  7. The aortic valve is  tricuspid. Aortic valve regurgitation is not visualized. Mild to moderate aortic valve sclerosis/calcification is present, without any evidence of aortic stenosis.  8. Aortic dilatation noted. There is mild dilatation of the ascending aorta, measuring 40  mm.  9. The inferior vena cava is dilated in size with <50% respiratory variability, suggesting right atrial pressure of 15 mmHg. Comparison(s): The left ventricular function No significant change. FINDINGS  Left Ventricle: Left ventricular ejection fraction, by estimation, is 55%%. The left ventricle has low normal function. The left ventricle has no regional wall motion abnormalities. Definity contrast agent was given IV to delineate the left ventricular endocardial borders. The left ventricular internal cavity size was normal in size. There is mild left ventricular hypertrophy. Left ventricular diastolic parameters are indeterminate. Right Ventricle: The right ventricular size is normal. Right vetricular wall thickness was not assessed. Right ventricular systolic function is low normal. There is moderately elevated pulmonary artery systolic pressure. The tricuspid regurgitant velocity is 3.07 m/s, and with an assumed right atrial pressure of 15 mmHg, the estimated right ventricular systolic pressure is 67.1 mmHg. Left Atrium: Left atrial size was severely dilated. Right Atrium: Right atrial size was severely dilated. Pericardium: Trivial pericardial effusion is present. 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 mean gradient through the valve is approximately 4 mm Hg. The mitral valve has been repaired/replaced. Trivial mitral valve regurgitation. There is a St. Jude mechanical valve present in the mitral position. The mean mitral valve gradient is 4.0 mmHg. Tricuspid Valve: TR is eccentric, directed toward the interatrial septum. The tricuspid valve is normal in structure. Tricuspid valve regurgitation is moderate to  severe. Aortic Valve: The aortic valve is tricuspid. Aortic valve regurgitation is not visualized. Mild to moderate aortic valve sclerosis/calcification is present, without any evidence of aortic stenosis. Aortic valve mean gradient measures 5.4 mmHg. Aortic valve peak gradient measures 9.4 mmHg. Aortic valve area, by VTI measures 1.38 cm. Pulmonic Valve: The pulmonic valve was normal in structure. Pulmonic valve regurgitation is not visualized. Aorta: The aortic root is normal in size and structure and aortic dilatation noted. There is mild dilatation of the ascending aorta, measuring 40 mm. Venous: The inferior vena cava is dilated in size with less than 50% respiratory variability, suggesting right atrial pressure of 15 mmHg. IAS/Shunts: No atrial level shunt detected by color flow Doppler.  LEFT VENTRICLE PLAX 2D LVIDd:         3.90 cm LVIDs:         2.70 cm LV PW:         1.70 cm LV IVS:        1.40 cm LVOT diam:     1.90 cm LV SV:         36 LV SV Index:   20 LVOT Area:     2.84 cm  LV Volumes (MOD) LV vol d, MOD A2C: 78.1 ml LV vol d, MOD A4C: 69.7 ml LV vol s, MOD A2C: 49.2 ml LV vol s, MOD A4C: 42.3 ml LV SV MOD A2C:     28.9 ml LV SV MOD A4C:     69.7 ml LV SV MOD BP:      28.1 ml RIGHT VENTRICLE            IVC RV Basal diam:  3.40 cm    IVC diam: 2.50 cm RV S prime:     8.38 cm/s TAPSE (M-mode): 1.2 cm LEFT ATRIUM              Index        RIGHT ATRIUM           Index LA diam:  5.20 cm  2.87 cm/m   RA Area:     35.80 cm LA Vol (A2C):   288.0 ml 158.99 ml/m RA Volume:   122.00 ml 67.35 ml/m LA Vol (A4C):   189.0 ml 104.33 ml/m LA Biplane Vol: 238.0 ml 131.38 ml/m  AORTIC VALVE AV Area (Vmax):    1.33 cm AV Area (Vmean):   1.29 cm AV Area (VTI):     1.38 cm AV Vmax:           153.26 cm/s AV Vmean:          105.920 cm/s AV VTI:            0.262 m AV Peak Grad:      9.4 mmHg AV Mean Grad:      5.4 mmHg LVOT Vmax:         72.00 cm/s LVOT Vmean:        48.267 cm/s LVOT VTI:          0.127 m  LVOT/AV VTI ratio: 0.49  AORTA Ao Root diam: 3.20 cm Ao Asc diam:  4.00 cm MITRAL VALVE           TRICUSPID VALVE MV Mean grad: 4.0 mmHg TR Peak grad:   37.7 mmHg                        TR Vmax:        307.00 cm/s                         SHUNTS                        Systemic VTI:  0.13 m                        Systemic Diam: 1.90 cm Dorris Carnes MD Electronically signed by Dorris Carnes MD Signature Date/Time: 02/10/2021/5:02:11 PM    Final       The results of significant diagnostics from this hospitalization (including imaging, microbiology, ancillary and laboratory) are listed below for reference.     Microbiology: Recent Results (from the past 240 hour(s))  Blood culture (routine x 2)     Status: Abnormal   Collection Time: 02/05/21  6:15 PM   Specimen: BLOOD  Result Value Ref Range Status   Specimen Description BLOOD SITE NOT SPECIFIED  Final   Special Requests   Final    BOTTLES DRAWN AEROBIC AND ANAEROBIC Blood Culture results may not be optimal due to an inadequate volume of blood received in culture bottles   Culture  Setup Time   Final    GRAM POSITIVE COCCI IN BOTH AEROBIC AND ANAEROBIC BOTTLES CRITICAL RESULT CALLED TO, READ BACK BY AND VERIFIED WITH: PHARMD T. DANG [id number] FCP    Culture (A)  Final    STAPHYLOCOCCUS HOMINIS THE SIGNIFICANCE OF ISOLATING THIS ORGANISM FROM A SINGLE SET OF BLOOD CULTURES WHEN MULTIPLE SETS ARE DRAWN IS UNCERTAIN. PLEASE NOTIFY THE MICROBIOLOGY DEPARTMENT WITHIN ONE WEEK IF SPECIATION AND SENSITIVITIES ARE REQUIRED. Performed at [facility], [place] [address], [place], [place] [address]    Report Status 02/08/2021 FINAL  Final  Blood Culture ID Panel (Reflexed)     Status: Abnormal   Collection Time: 02/05/21  6:15 PM  Result Value Ref Range Status   Enterococcus faecalis NOT DETECTED NOT DETECTED Final  Enterococcus Faecium NOT DETECTED NOT DETECTED Final   Listeria monocytogenes NOT DETECTED NOT DETECTED Final   Staphylococcus species  DETECTED (A) NOT DETECTED Final    Comment: CRITICAL RESULT CALLED TO, READ BACK BY AND VERIFIED WITH: PHARMD T. DANG [id number] FCP    Staphylococcus aureus (BCID) NOT DETECTED NOT DETECTED Final   Staphylococcus epidermidis NOT DETECTED NOT DETECTED Final   Staphylococcus lugdunensis NOT DETECTED NOT DETECTED Final   Streptococcus species NOT DETECTED NOT DETECTED Final   Streptococcus agalactiae NOT DETECTED NOT DETECTED Final   Streptococcus pneumoniae NOT DETECTED NOT DETECTED Final   Streptococcus pyogenes NOT DETECTED NOT DETECTED Final   A.calcoaceticus-baumannii NOT DETECTED NOT DETECTED Final   Bacteroides fragilis NOT DETECTED NOT DETECTED Final   Enterobacterales NOT DETECTED NOT DETECTED Final   Enterobacter cloacae complex NOT DETECTED NOT DETECTED Final   Escherichia coli NOT DETECTED NOT DETECTED Final   Klebsiella aerogenes NOT DETECTED NOT DETECTED Final   Klebsiella oxytoca NOT DETECTED NOT DETECTED Final   Klebsiella pneumoniae NOT DETECTED NOT DETECTED Final   Proteus species NOT DETECTED NOT DETECTED Final   Salmonella species NOT DETECTED NOT DETECTED Final   Serratia marcescens NOT DETECTED NOT DETECTED Final   Haemophilus influenzae NOT DETECTED NOT DETECTED Final   Neisseria meningitidis NOT DETECTED NOT DETECTED Final   Pseudomonas aeruginosa NOT DETECTED NOT DETECTED Final   Stenotrophomonas maltophilia NOT DETECTED NOT DETECTED Final   Candida albicans NOT DETECTED NOT DETECTED Final   Candida auris NOT DETECTED NOT DETECTED Final   Candida glabrata NOT DETECTED NOT DETECTED Final   Candida krusei NOT DETECTED NOT DETECTED Final   Candida parapsilosis NOT DETECTED NOT DETECTED Final   Candida tropicalis NOT DETECTED NOT DETECTED Final   Cryptococcus neoformans/gattii NOT DETECTED NOT DETECTED Final    Comment: Performed at [facility], [place] [address], [place], [place] [address]  Blood culture (routine x 2)     Status: None   Collection Time:  02/05/21  7:17 PM   Specimen: BLOOD  Result Value Ref Range Status   Specimen Description BLOOD SITE NOT SPECIFIED  Final   Special Requests   Final    BOTTLES DRAWN AEROBIC AND ANAEROBIC Blood Culture adequate volume   Culture   Final    NO GROWTH 5 DAYS Performed at [facility], [address] [place], [place] [address]    Report Status 02/10/2021 FINAL  Final  Resp Panel by RT-PCR (Flu A&B, Covid) Nasopharyngeal Swab     Status: None   Collection Time: 02/05/21 10:30 PM   Specimen: Nasopharyngeal Swab; Nasopharyngeal(NP) swabs in vial transport medium  Result Value Ref Range Status   SARS Coronavirus 2 by RT PCR NEGATIVE NEGATIVE Final    Comment: (NOTE) SARS-CoV-2 target nucleic acids are NOT DETECTED.  The SARS-CoV-2 RNA is generally detectable in upper respiratory specimens during the acute phase of infection. The lowest concentration of SARS-CoV-2 viral copies this assay can detect is 138 copies/mL. A negative result does not preclude SARS-Cov-2 infection and should not be used as the sole basis for treatment or other patient management decisions. A negative result may occur with  improper specimen collection/handling, submission of specimen other than nasopharyngeal swab, presence of viral mutation(s) within the areas targeted by this assay, and inadequate number of viral copies(<138 copies/mL). A negative result must be combined with clinical observations, patient history, and epidemiological information. The expected result is Negative.  Fact Sheet for Patients:  EntrepreneurPulse.com.au  Fact Sheet for Healthcare Providers:  IncredibleEmployment.be  This test is no t yet approved or cleared by the [place] FDA and  has been authorized for detection and/or diagnosis of SARS-CoV-2 by FDA under an Emergency Use Authorization (EUA). This EUA will remain  in effect (meaning this test can be used) for the duration of  the COVID-19 declaration under Section 564(b)(1) of the Act, 21 U.S.C.section 360bbb-3(b)(1), unless the authorization is terminated  or revoked sooner.       Influenza A by PCR NEGATIVE NEGATIVE Final   Influenza B by PCR NEGATIVE NEGATIVE Final    Comment: (NOTE) The Xpert Xpress SARS-CoV-2/FLU/RSV plus assay is intended as an aid in the diagnosis of influenza from Nasopharyngeal swab specimens and should not be used as a sole basis for treatment. Nasal washings and aspirates are unacceptable for Xpert Xpress SARS-CoV-2/FLU/RSV testing.  Fact Sheet for Patients: EntrepreneurPulse.com.au  Fact Sheet for Healthcare Providers: IncredibleEmployment.be  This test is not yet approved or cleared by the [place] FDA and has been authorized for detection and/or diagnosis of SARS-CoV-2 by FDA under an Emergency Use Authorization (EUA). This EUA will remain in effect (meaning this test can be used) for the duration of the COVID-19 declaration under Section 564(b)(1) of the Act, 21 U.S.C. section 360bbb-3(b)(1), unless the authorization is terminated or revoked.  Performed at [facility], [place] [address] [place], [place] [address]   Culture, blood (routine x 2)     Status: None   Collection Time: 02/06/21  6:50 PM   Specimen: BLOOD RIGHT WRIST  Result Value Ref Range Status   Specimen Description BLOOD RIGHT WRIST  Final   Special Requests AEROBIC BOTTLE ONLY Blood Culture adequate volume  Final   Culture   Final    NO GROWTH 5 DAYS Performed at [facility], [place] [address] [place], [place] [address]    Report Status 02/11/2021 FINAL  Final  Culture, blood (routine x 2)     Status: None   Collection Time: 02/06/21  7:06 PM   Specimen: BLOOD RIGHT WRIST  Result Value Ref Range Status   Specimen Description BLOOD RIGHT WRIST  Final   Special Requests AEROBIC BOTTLE ONLY Blood Culture adequate volume  Final   Culture   Final     NO GROWTH 5 DAYS Performed at [facility], [place] [address], [place], [place] [address]    Report Status 02/11/2021 FINAL  Final  Culture, blood (routine x 2)     Status: None (Preliminary result)   Collection Time: 02/08/21  9:55 PM   Specimen: BLOOD LEFT HAND  Result Value Ref Range Status   Specimen Description BLOOD LEFT HAND  Final   Special Requests   Final    BOTTLES DRAWN AEROBIC AND ANAEROBIC Blood Culture adequate volume   Culture   Final    NO GROWTH 4 DAYS Performed at [facility], [place] [address], [place], [place] [address]    Report Status PENDING  Incomplete  Culture, blood (routine x 2)     Status: Abnormal   Collection Time: 02/08/21 10:04 PM   Specimen: BLOOD RIGHT HAND  Result Value Ref Range Status   Specimen Description BLOOD RIGHT HAND  Final   Special Requests   Final    BOTTLES DRAWN AEROBIC AND ANAEROBIC Blood Culture adequate volume   Culture  Setup Time   Final    AEROBIC BOTTLE ONLY GRAM POSITIVE COCCI CRITICAL VALUE NOTED.  VALUE IS CONSISTENT WITH PREVIOUSLY REPORTED AND CALLED VALUE. Performed at [facility], [place] [address] [place], [place] [address]    Culture STAPHYLOCOCCUS WARNERI (A)  Final   Report Status 02/12/2021 FINAL  Final   Organism ID, Bacteria STAPHYLOCOCCUS WARNERI  Final      Susceptibility   Staphylococcus warneri - MIC*    CIPROFLOXACIN <=0.5 SENSITIVE Sensitive     ERYTHROMYCIN >=8 RESISTANT Resistant     GENTAMICIN <=0.5 SENSITIVE Sensitive     OXACILLIN >=4 RESISTANT Resistant     TETRACYCLINE <=1 SENSITIVE Sensitive     VANCOMYCIN <=0.5 SENSITIVE Sensitive     TRIMETH/SULFA <=10 SENSITIVE Sensitive     CLINDAMYCIN <=0.25 SENSITIVE Sensitive     RIFAMPIN <=0.5 SENSITIVE Sensitive     Inducible Clindamycin NEGATIVE Sensitive     * STAPHYLOCOCCUS WARNERI  Culture, blood (routine x 2)     Status: None (Preliminary result)   Collection Time: 02/11/21  7:50 AM   Specimen: BLOOD  Result Value Ref Range  Status   Specimen Description BLOOD LEFT ANTECUBITAL  Final   Special Requests   Final    BOTTLES DRAWN AEROBIC ONLY Blood Culture adequate volume   Culture   Final    NO GROWTH < 24 HOURS Performed at [facility], [address] [place], [place] [address]    Report Status PENDING  Incomplete  Culture, blood (routine x 2)     Status: None (Preliminary result)   Collection Time: 02/11/21  7:51 AM   Specimen: BLOOD  Result Value Ref Range Status   Specimen Description BLOOD RIGHT ANTECUBITAL  Final   Special Requests   Final    BOTTLES DRAWN AEROBIC ONLY Blood Culture results may not be optimal due to an excessive volume of blood received in culture bottles   Culture   Final    NO GROWTH < 24 HOURS Performed at [facility], [place] [address] [place], [place] [address]    Report Status PENDING  Incomplete     Labs: BNP (last 3 results) No results for input(s): BNP in the last 8760 hours. Basic Metabolic Panel: Recent Labs  Lab 02/05/21 1814 02/05/21 1911 02/06/21 0705 02/07/21 0413 02/08/21 0209  NA 135 136 139 136 136  K 4.1 4.2 3.8 3.7 4.0  CL 102 102 103 103 102  CO2 27  --  _0 GLUCOSE 152* 153* 113* 112* 155*  BUN _1 CREATININE 1.09* 1.10* 0.90 0.94 0.96  CALCIUM 9.7  --  9.5 9.3 9.3  MG  --   --  1.7  --   --    Liver Function Tests: Recent Labs  Lab 02/05/21 1814  AST 21  ALT 13  ALKPHOS 61  BILITOT 1.0  PROT 6.9  ALBUMIN 3.7   Recent Labs  Lab 02/05/21 1814  LIPASE 47   No results for input(s): AMMONIA in the last 168 hours. CBC: Recent Labs  Lab 02/05/21 1814 02/05/21 1911 02/06/21 0705 02/07/21 0413 02/08/21 0209  WBC 8.5  --  6.9 6.8 7.3  NEUTROABS 6.4  --   --   --   --   HGB 10.7* 11.2* 10.0* 9.9* 9.6*  HCT 31.4* 33.0* 29.2* 29.1* 28.3*  MCV 80.1  --  79.3* 79.7* 78.8*  PLT 261  --  238 230 240   Cardiac Enzymes: Recent Labs  Lab 02/10/21 0120  CKTOTAL 37*   BNP: Invalid  input(s):  POCBNP CBG: Recent Labs  Lab 02/11/21 0622 02/11/21 1131 02/11/21 1555 02/11/21 2121 02/12/21 0615  GLUCAP 136* 180* 100* 129* 113*   D-Dimer No results for input(s): DDIMER in the last 72 hours. Hgb A1c No results for input(s): HGBA1C in the last 72 hours. Lipid Profile No results for input(s): CHOL, HDL, LDLCALC, TRIG, CHOLHDL, LDLDIRECT in the last 72 hours. Thyroid function studies No results for input(s): TSH, T4TOTAL, T3FREE, THYROIDAB in the last 72 hours.  Invalid input(s): FREET3 Anemia work up Recent Labs    02/10/21 0120  FERRITIN 80   Urinalysis    Component Value Date/Time   COLORURINE YELLOW 02/09/2021 [place] 02/09/2021 0917   LABSPEC 1.008 02/09/2021 0917   PHURINE 6.0 02/09/2021 0917   GLUCOSEU NEGATIVE 02/09/2021 0917   GLUCOSEU NEGATIVE 04/27/2019 1041   HGBUR LARGE (A) 02/09/2021 0917   BILIRUBINUR NEGATIVE 02/09/2021 0917   KETONESUR NEGATIVE 02/09/2021 0917   PROTEINUR NEGATIVE 02/09/2021 0917   UROBILINOGEN 0.2 04/27/2019 1041   NITRITE NEGATIVE 02/09/2021 0917   LEUKOCYTESUR NEGATIVE 02/09/2021 0917   Sepsis Labs Invalid input(s): PROCALCITONIN,  WBC,  LACTICIDVEN Microbiology Recent Results (from the past 240 hour(s))  Blood culture (routine x 2)     Status: Abnormal   Collection Time: 02/05/21  6:15 PM   Specimen: BLOOD  Result Value Ref Range Status   Specimen Description BLOOD SITE NOT SPECIFIED  Final   Special Requests   Final    BOTTLES DRAWN AEROBIC AND ANAEROBIC Blood Culture results may not be optimal due to an inadequate volume of blood received in culture bottles   Culture  Setup Time   Final    GRAM POSITIVE COCCI IN BOTH AEROBIC AND ANAEROBIC BOTTLES CRITICAL RESULT CALLED TO, READ BACK BY AND VERIFIED WITH: PHARMD T. DANG [phone] [id number] FCP    Culture (A)  Final    STAPHYLOCOCCUS HOMINIS THE SIGNIFICANCE OF ISOLATING THIS ORGANISM FROM A SINGLE SET OF BLOOD CULTURES WHEN MULTIPLE SETS ARE DRAWN IS  UNCERTAIN. PLEASE NOTIFY THE MICROBIOLOGY DEPARTMENT WITHIN ONE WEEK IF SPECIATION AND SENSITIVITIES ARE REQUIRED. Performed at [facility], [place] [address], [place], [place] [address]    Report Status 02/08/2021 FINAL  Final  Blood Culture ID Panel (Reflexed)     Status: Abnormal   Collection Time: 02/05/21  6:15 PM  Result Value Ref Range Status   Enterococcus faecalis NOT DETECTED NOT DETECTED Final   Enterococcus Faecium NOT DETECTED NOT DETECTED Final   Listeria monocytogenes NOT DETECTED NOT DETECTED Final   Staphylococcus species DETECTED (A) NOT DETECTED Final    Comment: CRITICAL RESULT CALLED TO, READ BACK BY AND VERIFIED WITH: PHARMD T. DANG [phone] FCP    Staphylococcus aureus (BCID) NOT DETECTED NOT DETECTED Final   Staphylococcus epidermidis NOT DETECTED NOT DETECTED Final   Staphylococcus lugdunensis NOT DETECTED NOT DETECTED Final   Streptococcus species NOT DETECTED NOT DETECTED Final   Streptococcus agalactiae NOT DETECTED NOT DETECTED Final   Streptococcus pneumoniae NOT DETECTED NOT DETECTED Final   Streptococcus pyogenes NOT DETECTED NOT DETECTED Final   A.calcoaceticus-baumannii NOT DETECTED NOT DETECTED Final   Bacteroides fragilis NOT DETECTED NOT DETECTED Final   Enterobacterales NOT DETECTED NOT DETECTED Final   Enterobacter cloacae complex NOT DETECTED NOT DETECTED Final   Escherichia coli NOT DETECTED NOT DETECTED Final   Klebsiella aerogenes NOT DETECTED NOT DETECTED Final   Klebsiella oxytoca NOT DETECTED NOT DETECTED Final   Klebsiella pneumoniae NOT  DETECTED NOT DETECTED Final   Proteus species NOT DETECTED NOT DETECTED Final   Salmonella species NOT DETECTED NOT DETECTED Final   Serratia marcescens NOT DETECTED NOT DETECTED Final   Haemophilus influenzae NOT DETECTED NOT DETECTED Final   Neisseria meningitidis NOT DETECTED NOT DETECTED Final   Pseudomonas aeruginosa NOT DETECTED NOT DETECTED Final   Stenotrophomonas maltophilia NOT  DETECTED NOT DETECTED Final   Candida albicans NOT DETECTED NOT DETECTED Final   Candida auris NOT DETECTED NOT DETECTED Final   Candida glabrata NOT DETECTED NOT DETECTED Final   Candida krusei NOT DETECTED NOT DETECTED Final   Candida parapsilosis NOT DETECTED NOT DETECTED Final   Candida tropicalis NOT DETECTED NOT DETECTED Final   Cryptococcus neoformans/gattii NOT DETECTED NOT DETECTED Final    Comment: Performed at [facility], [place] [address] [place], [place] [address]  Blood culture (routine x 2)     Status: None   Collection Time: 02/05/21  7:17 PM   Specimen: BLOOD  Result Value Ref Range Status   Specimen Description BLOOD SITE NOT SPECIFIED  Final   Special Requests   Final    BOTTLES DRAWN AEROBIC AND ANAEROBIC Blood Culture adequate volume   Culture   Final    NO GROWTH 5 DAYS Performed at [facility], [address] [place], [place] [address]    Report Status 02/10/2021 FINAL  Final  Resp Panel by RT-PCR (Flu A&B, Covid) Nasopharyngeal Swab     Status: None   Collection Time: 02/05/21 10:30 PM   Specimen: Nasopharyngeal Swab; Nasopharyngeal(NP) swabs in vial transport medium  Result Value Ref Range Status   SARS Coronavirus 2 by RT PCR NEGATIVE NEGATIVE Final    Comment: (NOTE) SARS-CoV-2 target nucleic acids are NOT DETECTED.  The SARS-CoV-2 RNA is generally detectable in upper respiratory specimens during the acute phase of infection. The lowest concentration of SARS-CoV-2 viral copies this assay can detect is 138 copies/mL. A negative result does not preclude SARS-Cov-2 infection and should not be used as the sole basis for treatment or other patient management decisions. A negative result may occur with  improper specimen collection/handling, submission of specimen other than nasopharyngeal swab, presence of viral mutation(s) within the areas targeted by this assay, and inadequate number of viral copies(<138 copies/mL). A negative result must be  combined with clinical observations, patient history, and epidemiological information. The expected result is Negative.  Fact Sheet for Patients:  EntrepreneurPulse.com.au  Fact Sheet for Healthcare Providers:  IncredibleEmployment.be  This test is no t yet approved or cleared by the [place] FDA and  has been authorized for detection and/or diagnosis of SARS-CoV-2 by FDA under an Emergency Use Authorization (EUA). This EUA will remain  in effect (meaning this test can be used) for the duration of the COVID-19 declaration under Section 564(b)(1) of the Act, 21 U.S.C.section 360bbb-3(b)(1), unless the authorization is terminated  or revoked sooner.       Influenza A by PCR NEGATIVE NEGATIVE Final   Influenza B by PCR NEGATIVE NEGATIVE Final    Comment: (NOTE) The Xpert Xpress SARS-CoV-2/FLU/RSV plus assay is intended as an aid in the diagnosis of influenza from Nasopharyngeal swab specimens and should not be used as a sole basis for treatment. Nasal washings and aspirates are unacceptable for Xpert Xpress SARS-CoV-2/FLU/RSV testing.  Fact Sheet for Patients: EntrepreneurPulse.com.au  Fact Sheet for Healthcare Providers: IncredibleEmployment.be  This test is not yet approved or cleared by the [place] FDA and has  been authorized for detection and/or diagnosis of SARS-CoV-2 by FDA under an Emergency Use Authorization (EUA). This EUA will remain in effect (meaning this test can be used) for the duration of the COVID-19 declaration under Section 564(b)(1) of the Act, 21 U.S.C. section 360bbb-3(b)(1), unless the authorization is terminated or revoked.  Performed at [facility],  [address], [place], [place] [address]   Culture, blood (routine x 2)     Status: None   Collection Time: 02/06/21  6:50 PM   Specimen: BLOOD RIGHT WRIST  Result Value Ref Range Status   Specimen Description  BLOOD RIGHT WRIST  Final   Special Requests AEROBIC BOTTLE ONLY Blood Culture adequate volume  Final   Culture   Final    NO GROWTH 5 DAYS Performed at [facility], [place] [address] [place], [place] [address]    Report Status 02/11/2021 FINAL  Final  Culture, blood (routine x 2)     Status: None   Collection Time: 02/06/21  7:06 PM   Specimen: BLOOD RIGHT WRIST  Result Value Ref Range Status   Specimen Description BLOOD RIGHT WRIST  Final   Special Requests AEROBIC BOTTLE ONLY Blood Culture adequate volume  Final   Culture   Final    NO GROWTH 5 DAYS Performed at [facility], [place] [address] [place], [place] [address]    Report Status 02/11/2021 FINAL  Final  Culture, blood (routine x 2)     Status: None (Preliminary result)   Collection Time: 02/08/21  9:55 PM   Specimen: BLOOD LEFT HAND  Result Value Ref Range Status   Specimen Description BLOOD LEFT HAND  Final   Special Requests   Final    BOTTLES DRAWN AEROBIC AND ANAEROBIC Blood Culture adequate volume   Culture   Final    NO GROWTH 4 DAYS Performed at [facility], [place] [address], [place], [place] [address]    Report Status PENDING  Incomplete  Culture, blood (routine x 2)     Status: Abnormal   Collection Time: 02/08/21 10:04 PM   Specimen: BLOOD RIGHT HAND  Result Value Ref Range Status   Specimen Description BLOOD RIGHT HAND  Final   Special Requests   Final    BOTTLES DRAWN AEROBIC AND ANAEROBIC Blood Culture adequate volume   Culture  Setup Time   Final    AEROBIC BOTTLE ONLY GRAM POSITIVE COCCI CRITICAL VALUE NOTED.  VALUE IS CONSISTENT WITH PREVIOUSLY REPORTED AND CALLED VALUE. Performed at [facility], [place] [address] [place], [place] [address]    Culture STAPHYLOCOCCUS WARNERI (A)  Final   Report Status 02/12/2021 FINAL  Final   Organism ID, Bacteria STAPHYLOCOCCUS WARNERI  Final      Susceptibility   Staphylococcus warneri - MIC*    CIPROFLOXACIN <=0.5 SENSITIVE Sensitive      ERYTHROMYCIN >=8 RESISTANT Resistant     GENTAMICIN <=0.5 SENSITIVE Sensitive     OXACILLIN >=4 RESISTANT Resistant     TETRACYCLINE <=1 SENSITIVE Sensitive     VANCOMYCIN <=0.5 SENSITIVE Sensitive     TRIMETH/SULFA <=10 SENSITIVE Sensitive     CLINDAMYCIN <=0.25 SENSITIVE Sensitive     RIFAMPIN <=0.5 SENSITIVE Sensitive     Inducible Clindamycin NEGATIVE Sensitive     * STAPHYLOCOCCUS WARNERI  Culture, blood (routine x 2)     Status: None (Preliminary result)   Collection Time: 02/11/21  7:50 AM   Specimen: BLOOD  Result Value Ref  Range Status   Specimen Description BLOOD LEFT ANTECUBITAL  Final   Special Requests   Final    BOTTLES DRAWN AEROBIC ONLY Blood Culture adequate volume   Culture   Final    NO GROWTH < 24 HOURS Performed at [facility], [address] [place], [place] [address]    Report Status PENDING  Incomplete  Culture, blood (routine x 2)     Status: None (Preliminary result)   Collection Time: 02/11/21  7:51 AM   Specimen: BLOOD  Result Value Ref Range Status   Specimen Description BLOOD RIGHT ANTECUBITAL  Final   Special Requests   Final    BOTTLES DRAWN AEROBIC ONLY Blood Culture results may not be optimal due to an excessive volume of blood received in culture bottles   Culture   Final    NO GROWTH < 24 HOURS Performed at [facility], [place] [address], [place], [place] [address]    Report Status PENDING  Incomplete     Time coordinating discharge in minutes: ***  SIGNED:   Debbe Odea, MD  [facility] 02/12/2021, 10:21 AM

## 2021-02-12 NOTE — Progress Notes (Signed)
[facility] for Warfarin/lovenox Indication: Afib/MVR  Allergies  Allergen Reactions  . Aricept [Donepezil Hcl]     Intolerance - bad dreams  . Food     Bananas and Fish, cause sinus drainage  . Furosemide     cramps  . Quinine Other (See Comments)    Can't hear  . Spironolactone     constipation  . Tramadol     headache  . Sulfadiazine Itching and Rash    Patient Measurements: Height: 5\' 2"  (157.5 cm) Weight: 80.1 kg (176 lb 9.4 oz) IBW/kg (Calculated) : 50.1  Vital Signs: Temp: 99.1 F (37.3 C) (04/18 0536) Temp Source: Oral (04/18 0536) BP: 104/67 (04/18 0536) Pulse Rate: 92 (04/18 0536)  Labs: Recent Labs    02/10/21 0120 02/11/21 0139 02/12/21 0146  LABPROT 25.5* 27.0* 27.3*  INR 2.3* 2.5* 2.5*  CKTOTAL 37*  --   --     Estimated Creatinine Clearance: 45.1 mL/min (by C-G formula based on SCr of 0.96 mg/dL).   Medical History: Past Medical History:  Diagnosis Date  . AF (atrial fibrillation) ([place])   . ALLERGIC RHINITIS   . Blood transfusion without reported diagnosis   . Cataract, senile   . Chest pain   . CHF (congestive heart failure) ([place])   . Chronic anticoagulation   . Diabetes mellitus   . Diabetes mellitus   . Disorder of oral soft tissue    of mouth  . Diverticulosis   . GERD (gastroesophageal reflux disease)   . Hematoma   . Hypertension   . Leg pain   . Low back pain   . Paresthesia   . Rheumatic fever   . Status post mitral valve replacement    St. Jude valve  . Stroke ([place])    tia  . Sweating   . Vitamin D deficiency      Assessment: 80 y/o F presents to the ED with palpitations, low back pain, and diarrhea. On warfarin PTA for Afib and MVR (INR goal 2.5-3.5). INR on admission 2.0. Pharmacy has been consulted to bridge warfarin with Lovenox.   Outpatient warfarin dosing per recent anti-coag notes: 15 mg on Mon/Fri and 10 mg all other days. Last outpatient INR was subtherapeutic at 2.1  on 3/21.  INR now therapeutic x 2 at 2.5 after slight inc from home regimen. No overt bleeding noted. Consider increasing weekly dose on discharge.  Goal of Therapy:  INR 2.5-3.5 per outpatient anti-coag notes Monitor platelets by anticoagulation protocol: Yes   Plan:  Warfarin 15 mg PO x 1 Discontinue Enoxaparin Monitor daily INR, CBC, clinical course, s/sx of bleed, PO intake/diet, Drug-Drug Interactions     Thank you for allowing [place] to participate in this patients care. Jens Som, PharmD 02/12/2021 8:58 AM  Please check [email or web address] for unit-specific pharmacy phone numbers.

## 2021-02-12 NOTE — Discharge Summary (Signed)
Physician Discharge Summary  JANY BUCKWALTER OHY:[id number] DOB: October 31, 1940 DOA: 02/05/2021  PCP: Cassandria Anger, MD  Admit date: 02/05/2021 Discharge date: 02/12/2021  Admitted From: home  Disposition:  home   Recommendations for Outpatient Follow-up:  1. F/u on temps 2. F/u INR on Monday 3. Increased medications for A-fib- follow up on [place]:  ordered  Discharge Condition:  stable   CODE STATUS:  Full code   Diet recommendation:  Heart healthy Consultations:  ID  Procedures/Studies: . See below   Discharge Diagnoses:  Principal Problem:   Atrial fibrillation with RVR ([place]) Active Problems:   SIRS (systemic inflammatory response syndrome) (HCC)   Acute febrile illness   DM2 (diabetes mellitus, type 2) (HCC)   MITRAL VALVE REPLACEMENT, HX OF   Urinary incontinence   Chronic kidney disease, stage 3a (HCC)   Diarrhea   Chronic diastolic CHF (congestive heart failure) (HCC)   Lung nodules     Brief Summary: EMMAMAE MCNAMARA is an 80 year old female with mechanical mitral valve on Coumadin, chronic diastolic heart failure, type 2 diabetes mellitus, hypertension who presented to the hospital with abdominal pain diarrhea occurring on Sunday.  She said she had 4 episodes of watery diarrhea with abdominal pain and fatigue on Sunday.  ED: Temperature 101.4, tachycardic, blood pressure 109/95, respiratory rate in 20s.  Cardiac rhythm was atrial fibrillation.   She was started on IV Cardizem infusion after bolus IV fluids. No antibiotics given. Later complained of left buttock pain with movement.   Hospital Course:  Principal Problem: Abdominal pain, diarrhea with fever, tachycardia, tachypnea -sepsis POA - ongoing fevers -abdominal pain and diarrhea resolved - he states she did not eat anything out of the ordinary or take any laxatives - tachycardia being controlled with meds (A fib) - 4/14>100.1- this is despite taking TID Acetaminophen and  Hydrocodone/acetominophen- stop acetaminophen and cont to follow temps closely - 4/15> fever overnight 101.6 - due to her mechanical mitral valve, an extensive work up was done and ID was consulted - she has no respiratory symptoms, no GI symptoms, no dental pain, no joint pain or rash, no headache - she has urinary incontinence and repeat UA is negative - CT abd/pelvis on admission unrevealing - procalcitonin < 0.10 - Left hip MRI neg -2 D ECHO negative - repeat Chest CT negtative - HIV non reactive - Hepatitis panel negative -  Gram stain from l4/14> staph Warneri- per ID this is another contaminant -  temps are 98- 99  Now- I will dc her  - I have asked her to check her temps morning and night and if there are 3 temps persistently > 100.5, then notify PCP    Active Problems:    Atrial fibrillation with RVR  -Off Cardizem infusion and on oral Cardizem now at a dose of 240 mg - 4/14>  added metoprolol - HR improved- follow BP closely - I arranged INR check for this Monday    Left gluteal pain -On exam on 4/13, she had in pain when I squeeze her upper outer gluteal area-I believe this is musculoskeletal-my colleague has started Flexeril 3 times daily and a lidocaine patch   - I have asked her to avoid activities that exacerbate the pain - she can have PRN Oxycodone for severe pain  - she states it is improving and she is able to move around much better now    DM2 (diabetes mellitus, type 2)  -Continue sliding scale insulin - A1c  6.8 on 4/5   Chemical mitral valve with subtherapeutic INR - INR 1.8 on 4/12- she admits to eating "greens" from her husbands garden and plans to continue to eat them -INR 2.5 now- -continue Coumadin 15 mg daily and check INR on Monday - she also has hematuria which is likely related to this mechanical valve    Discharge Exam: Vitals:   02/12/21 0857 02/12/21 1036  BP: 109/70 (!) 96/54  Pulse: 93 93  Resp: 18   Temp: 98.7 F (37.1 C)    SpO2: 97%    Vitals:   02/12/21 0512 02/12/21 0536 02/12/21 0857 02/12/21 1036  BP:  104/67 109/70 (!) 96/54  Pulse:  92 93 93  Resp:  16 18   Temp:  99.1 F (37.3 C) 98.7 F (37.1 C)   TempSrc:  Oral Oral   SpO2:  96% 97%   Weight: 80.1 kg     Height:        General: Pt is alert, awake, not in acute distress Cardiovascular: RRR, S1/S2 +, no rubs, no gallops Respiratory: CTA bilaterally, no wheezing, no rhonchi Abdominal: Soft, NT, ND, bowel sounds + Extremities: no edema, no cyanosis   Discharge Instructions   Allergies as of 02/12/2021      Reactions   Aricept [donepezil Hcl]    Intolerance - bad dreams   Food    Bananas and Fish, cause sinus drainage   Furosemide    cramps   Quinine Other (See Comments)   Can't hear   Spironolactone    constipation   Tramadol    headache   Sulfadiazine Itching, Rash      Medication List    TAKE these medications   Accu-Chek Aviva Plus test strip Generic drug: glucose blood TEST BLOOD SUGAR TWICE DAILY   Accu-Chek Aviva Plus w/Device Kit 1 each by Does not apply route 2 (two) times daily. Use as directed   Accu-Chek Aviva Soln 1 each by In Vitro route as directed.   accu-chek softclix lancets 1 each by Other route 2 (two) times daily. Use as instructed   Accu-Chek Softclix Lancets lancets 1 each by Other route 2 (two) times daily. Use to check blood sugars twice a day   acetaminophen 500 MG tablet Commonly known as: TYLENOL Take 1,000 mg by mouth every 6 (six) hours as needed for mild pain.   amoxicillin 500 MG tablet Commonly known as: AMOXIL Take four capsules (2,000 mg) one hour prior to all dental visits. What changed:   how much to take  how to take this  when to take this   B-D SINGLE USE SWABS REGULAR Pads 1 each by Does not apply route 2 (two) times daily. Use to clean finger to check blood sugars twice a day   Cartia XT 240 MG 24 hr capsule Generic drug: diltiazem Take 1 capsule (240 mg  total) by mouth daily. What changed:   medication strength  how much to take  additional instructions   cholecalciferol 25 MCG (1000 UNIT) tablet Commonly known as: VITAMIN D Take 1,000 Units by mouth daily.   cyclobenzaprine 10 MG tablet Commonly known as: FLEXERIL Take 1 tablet (10 mg total) by mouth 3 (three) times daily as needed for muscle spasms.   furosemide 20 MG tablet Commonly known as: LASIX Take 1 tablet (20 mg total) by mouth daily. Take in am   gabapentin 100 MG capsule Commonly known as: NEURONTIN Take 1 capsule (100 mg total) by mouth at bedtime.  HYDROcodone-acetaminophen 5-325 MG tablet Commonly known as: NORCO/VICODIN Take 0.5-1 tablets by mouth every 6 (six) hours as needed for severe pain.   loratadine 10 MG tablet Commonly known as: CLARITIN Take 1 tablet (10 mg total) by mouth daily as needed for allergies. Take in am   memantine 5 MG tablet Commonly known as: NAMENDA Take 1 tablet (5 mg total) by mouth 2 (two) times daily.   metFORMIN 500 MG tablet Commonly known as: GLUCOPHAGE Take 1 tablet (500 mg total) by mouth daily with breakfast.   metoprolol succinate 25 MG 24 hr tablet Commonly known as: Toprol XL Take 1 tablet (25 mg total) by mouth daily.   mirabegron ER 25 MG Tb24 tablet Commonly known as: Myrbetriq Take 1 tablet (25 mg total) by mouth daily.   ondansetron 4 MG tablet Commonly known as: ZOFRAN Take 1 tablet (4 mg total) by mouth every 8 (eight) hours as needed for nausea or vomiting.   OVER THE COUNTER MEDICATION Take 1 tablet by mouth as needed (joint pain).   oxyCODONE 5 MG immediate release tablet Commonly known as: Oxy IR/ROXICODONE Take 1 tablet (5 mg total) by mouth every 6 (six) hours as needed for severe pain.   pantoprazole 40 MG tablet Commonly known as: PROTONIX Take 1 tablet (40 mg total) by mouth daily. Take in am   potassium chloride 10 MEQ tablet Commonly known as: KLOR-CON Take 1 tablet (10 mEq  total) by mouth daily.   vitamin B-12 1000 MCG tablet Commonly known as: CYANOCOBALAMIN Take 1,000 mcg by mouth daily.   warfarin 10 MG tablet Commonly known as: COUMADIN Take as directed. If you are unsure how to take this medication, talk to your nurse or doctor. Original instructions: Please take 15 mg daily and have [facility]. What changed:   medication strength  additional instructions  Another medication with the same name was removed. Continue taking this medication, and follow the directions you see here.       Follow-up Information    [facility] up.   Specialty: Cardiology Why: appt for 4/15 @ 1:45 Contact information: [address], [place] [phone]       Plotnikov, Evie Lacks, MD. Schedule an appointment as soon as possible for a visit.   Specialty: Internal Medicine Why: 1-2 wks please Contact information: [place] [address] [phone]        Josue Hector, MD .   Specialty: Cardiology Contact information: [phone] [place] [address] [phone]              Allergies  Allergen Reactions  . Aricept [Donepezil Hcl]     Intolerance - bad dreams  . Food     Bananas and Fish, cause sinus drainage  . Furosemide     cramps  . Quinine Other (See Comments)    Can't hear  . Spironolactone     constipation  . Tramadol     headache  . Sulfadiazine Itching and Rash      MR HIP LEFT W WO CONTRAST  Result Date: 02/09/2021 CLINICAL DATA:  Left hip and gluteal pain. Fever. Question osteomyelitis. EXAM: MRI OF THE LEFT HIP WITHOUT AND WITH CONTRAST TECHNIQUE: Multiplanar, multisequence MR imaging was performed both before and after administration of intravenous contrast. CONTRAST:  7.5 mL GADAVIST IV SOLN COMPARISON:  CT abdomen and pelvis 02/05/2021  in 12/20/2016. FINDINGS: Bones:  There is no marrow edema or enhancement to suggest osteomyelitis. No fracture, stress change or worrisome lesion. Markedly T1 and T2 hypointense lesion in the right acetabulum is consistent with a bone island and stable since 2018. No avascular necrosis of the femoral heads. No subchondral cyst formation or edema about the hips. Articular cartilage and labrum Articular cartilage:  Mildly thinned without focal defect. Labrum:  The superior labrum is degenerated and frayed. Joint or bursal effusion Joint effusion:  Small left hip joint effusion. Bursae: Negative. Muscles and tendons Muscles and tendons:  Intact. Other findings Miscellaneous: Imaged intrapelvic contents demonstrate no acute or focal abnormality. The patient is status post hysterectomy. IMPRESSION: Negative for osteomyelitis or acute bony abnormality. Small left hip joint effusion is likely related to degenerative change although it cannot be definitively characterized. Electronically Signed   By: Inge Rise M.D.   On: 02/09/2021 15:18   CT Abdomen Pelvis W Contrast  Result Date: 02/05/2021 CLINICAL DATA:  Left-sided back and abdominal pain EXAM: CT ABDOMEN AND PELVIS WITH CONTRAST TECHNIQUE: Multidetector CT imaging of the abdomen and pelvis was performed using the standard protocol following bolus administration of intravenous contrast. CONTRAST:  175m OMNIPAQUE IOHEXOL 300 MG/ML  SOLN COMPARISON:  CT 09/22/2018 FINDINGS: Lower chest: Lung bases demonstrate no acute consolidation or effusion. Mild fibrosis within the medial right lung base. Cardiomegaly with biatrial enlargement. Mitral valve prosthesis. Hepatobiliary: Status post cholecystectomy. No biliary dilatation. No focal hepatic abnormality Pancreas: Unremarkable. No pancreatic ductal dilatation or surrounding inflammatory changes. Spleen: Normal in size without focal abnormality. Adrenals/Urinary Tract: Adrenal glands are normal. 6.3 cm cyst in the upper pole left kidney. Probable  cyst mid right kidney. The urinary bladder is unremarkable. Subcentimeter hypodense renal lesions too small to further characterize. Stomach/Bowel: Stomach is within normal limits. Appendix appears normal. No evidence of bowel wall thickening, distention, or inflammatory changes. Vascular/Lymphatic: Mild aortic atherosclerosis. No aneurysm. No suspicious nodes. Reproductive: Status post hysterectomy. No adnexal masses. Other: Negative for free air or free fluid. Small upper abdominal ventral hernia containing fat. Small amount of fluid in the hernia sac. Musculoskeletal: Stable sclerotic lesion within the right iliac bone measuring 18 mm, felt benign given lack of interval change. IMPRESSION: 1. No CT evidence for acute intra-abdominal or pelvic abnormality. 2. Cardiomegaly with biatrial enlargement. Aortic Atherosclerosis (ICD10-I70.0). Electronically Signed   By: KDonavan FoilM.D.   On: 02/05/2021 21:17   CT CHEST ABDOMEN PELVIS W CONTRAST  Result Date: 02/10/2021 CLINICAL DATA:  Fever of unknown origin EXAM: CT CHEST, ABDOMEN, AND PELVIS WITH CONTRAST TECHNIQUE: Multidetector CT imaging of the chest, abdomen and pelvis was performed following the standard protocol during bolus administration of intravenous contrast. CONTRAST:  1070mOMNIPAQUE IOHEXOL 300 MG/ML  SOLN COMPARISON:  CT abdomen pelvis 09/07/2021, chest radiograph 09/07/2021, CT neck with contrast 06/18/2019, thyroid ultrasound 10/15/2013 FINDINGS: CT CHEST FINDINGS Cardiovascular: Marked cardiomegaly with postsurgical changes from prior sternotomy and placement of a Saint Jude mitral valve prosthesis. Extensive coronary artery calcification is noted as well. Atherosclerotic plaque within the normal caliber aorta. No acute luminal abnormality of the imaged aorta. No periaortic stranding or hemorrhage. Normal 3 vessel branching of the aortic arch. Proximal great vessels are unremarkable. Central pulmonary arteries are top-normal caliber. No large  central filling defects are present within limitations of this non tailored examination of the pulmonary arteries. Mediastinum/Nodes: Postsurgical changes in the anterior mediastinum related to prior sternotomy. No mediastinal fluid or gas. Heterogeneous and enlarged left  thyroid gland with suggestion of multiple underlying thyroid nodules, similar to comparison 06/18/2019. No acute abnormality of the trachea or esophagus. No worrisome mediastinal, hilar or axillary adenopathy. Lungs/Pleura: Low volumes and atelectasis with additional bandlike areas of further atelectasis and/or scarring. Diffuse mild airways thickening and scattered secretions. No consolidative process. No pneumothorax or effusion. No convincing CT features of edema. Few scattered sub 5 mm solid and subsolid nodules seen in the left upper lobe (4/55, 51, 60). Additional solid 5 mm nodule in the right upper lobe adjacent the fissure (4/83). No other concerning nodules or masses. Musculoskeletal: Multilevel degenerative changes are present in the imaged portions of the spine. No acute osseous abnormality or suspicious osseous lesion. Additional degenerative changes in the shoulders prior sternotomy. Bony fusion across the sternal plate. CT ABDOMEN PELVIS FINDINGS Hepatobiliary: No worrisome focal liver lesions. Smooth liver surface contour. Normal hepatic attenuation. Prior cholecystectomy. No biliary dilatation or intraductal gallstones. Pancreas: No pancreatic ductal dilatation or surrounding inflammatory changes. Spleen: Normal in size. No concerning splenic lesions. Adrenals/Urinary Tract: Normal adrenal glands. Stable mild perinephric stranding, nonspecific. Kidneys are normally located with symmetric enhancementwith symmetrically delayed excretion. Fluid attenuation 5.7 cm cyst in the left kidney. Additional probable 1.4 cm cyst in the interpolar right kidney as well. Additional subcentimeter hypoattenuating foci in both kidneys too small to  fully characterize on CT imaging but statistically likely benign. no suspicious renal lesion, urolithiasis or hydronephrosis. Urinary bladder is moderately distended but without other gross abnormality. Stomach/Bowel: Distal esophagus, stomach and duodenal sweep are unremarkable. No small bowel wall thickening or dilatation. A normal appendix is visualized. No colonic dilatation or wall thickening. Moderate stool burden. No evidence of bowel obstruction with high attenuation enteric contrast media traversing to the level of the distal ileum. Vascular/Lymphatic: Atherosclerotic calcifications within the abdominal aorta and branch vessels. No aneurysm or ectasia. No enlarged abdominopelvic lymph nodes. Reproductive: Uterus is surgically absent. No concerning adnexal lesions. No concerning adnexal lesions. Other: Small upper abdominal fat containing ventral hernia anterior to the falciform ligament (3/63) with small amount of fluid in the hernia sac. No bowel containing hernia. No abdominopelvic free air or fluid. Multiple sites of focal soft tissue stranding and punctate foci of soft tissue gas along the anterior abdominal wall likely related to injectable use. Mild body wall edema. Calcified injection granulomata posteriorly as well. Musculoskeletal: Multilevel degenerative changes are present in the imaged portions of the spine. Features most pronounced L4-S1 with some mild interspinous arthrosis as well. Stable sclerotic focus in the right iliac measuring up to 18 mm in size with narrow zone of transition. Appearance and stability favoring benignity. No concerning or acute osseous abnormalities. IMPRESSION: 1. Low volumes and atelectasis with mild airways thickening and scattered secretions. Correlate for features of bronchitis. 2. No other acute intrathoracic process. 3. Few scattered sub 5 mm solid and subsolid nodules in the left upper lobe, nonspecific no can be post infectious or inflammatory. No follow-up  needed if patient is low-risk (and has no known or suspected primary neoplasm). Non-contrast chest CT can be considered in 12 months if patient is high-risk. This recommendation follows the consensus statement: Guidelines for Management of Incidental Pulmonary Nodules Detected on CT Images: From the Fleischner Society 2017; Radiology 2017; 284:228-243. 4. Small upper abdominal fat containing ventral hernia anterior to the falciform ligament with small amount of fluid in the hernia sac, unchanged from prior. No bowel containing hernia. 5. Multiple sites of focal soft tissue stranding and punctate foci of soft  tissue gas along the anterior abdominal wall likely related to injectable use. Correlate with visual inspection. 6. No other acute or conspicuous abdominopelvic abnormalities provide cause for patient's symptoms. 7. Prior mitral valvuloplasty. 8. Aortic Atherosclerosis (ICD10-I70.0). Electronically Signed   By: Lovena Le M.D.   On: 02/10/2021 03:04   DG Chest Portable 1 View  Result Date: 02/05/2021 CLINICAL DATA:  Chest pain and tachycardia. EXAM: PORTABLE CHEST 1 VIEW COMPARISON:  November 19, 2020 FINDINGS: Multiple sternal wires are noted. The cardiac silhouette is markedly enlarged and unchanged in size. Both lungs are clear. The visualized skeletal structures are unremarkable. IMPRESSION: Stable cardiomegaly without acute or active cardiopulmonary disease. Electronically Signed   By: Virgina Norfolk M.D.   On: 02/05/2021 19:37   ECHOCARDIOGRAM COMPLETE  Result Date: 02/10/2021    ECHOCARDIOGRAM REPORT   Patient Name:   CASSIDY TASHIRO Date of Exam: 02/10/2021 Medical Rec #:  [id number]       Height:       62.0 in Accession #:    [id number]      Weight:       176.2 lb Date of Birth:  [birth year]      BSA:          1.811 m Patient Age:    40 years        BP:           107/65 mmHg Patient Gender: F               HR:           95 bpm. Exam Location:  Inpatient Procedure: 2D Echo, Cardiac Doppler,  Color Doppler and Intracardiac            Opacification Agent Indications:    Fever                 S/P mitral valve replacement  History:        Patient has prior history of Echocardiogram examinations, most                 recent 05/10/2020. CHF, Arrythmias:Atrial Fibrillation; Risk                 Factors:Hypertension and Diabetes. S/p mechanical mitral valve                 replacement (St. Jude mechanical valve).  Sonographer:    Clayton Lefort RDCS (AE) Referring Phys: [id number] [place]  1. Left ventricular ejection fraction, by estimation, is 55%%. The left ventricle has low normal function. The left ventricle has no regional wall motion abnormalities. There is mild left ventricular hypertrophy. Left ventricular diastolic parameters are indeterminate.  2. Right ventricular systolic function is low normal. The right ventricular size is normal. There is moderately elevated pulmonary artery systolic pressure.  3. Left atrial size was severely dilated.  4. Right atrial size was severely dilated.  5. MV prosthesis (St. Jude mechanical) is well seated, appears to open well. mean gradient through the valve is approximately 4 mm Hg. . The mitral valve has been repaired/replaced. Trivial mitral valve regurgitation.  6. TR is eccentric, directed toward the interatrial septum.. Tricuspid valve regurgitation is moderate to severe.  7. The aortic valve is tricuspid. Aortic valve regurgitation is not visualized. Mild to moderate aortic valve sclerosis/calcification is present, without any evidence of aortic stenosis.  8. Aortic dilatation noted. There is mild dilatation of the ascending aorta, measuring 40 mm.  9. The inferior  vena cava is dilated in size with <50% respiratory variability, suggesting right atrial pressure of 15 mmHg. Comparison(s): The left ventricular function No significant change. FINDINGS  Left Ventricle: Left ventricular ejection fraction, by estimation, is 55%%. The left ventricle has  low normal function. The left ventricle has no regional wall motion abnormalities. Definity contrast agent was given IV to delineate the left ventricular endocardial borders. The left ventricular internal cavity size was normal in size. There is mild left ventricular hypertrophy. Left ventricular diastolic parameters are indeterminate. Right Ventricle: The right ventricular size is normal. Right vetricular wall thickness was not assessed. Right ventricular systolic function is low normal. There is moderately elevated pulmonary artery systolic pressure. The tricuspid regurgitant velocity is 3.07 m/s, and with an assumed right atrial pressure of 15 mmHg, the estimated right ventricular systolic pressure is 33.5 mmHg. Left Atrium: Left atrial size was severely dilated. Right Atrium: Right atrial size was severely dilated. Pericardium: Trivial pericardial effusion is present. 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 mean gradient through the valve is approximately 4 mm Hg. The mitral valve has been repaired/replaced. Trivial mitral valve regurgitation. There is a St. Jude mechanical valve present in the mitral position. The mean mitral valve gradient is 4.0 mmHg. Tricuspid Valve: TR is eccentric, directed toward the interatrial septum. The tricuspid valve is normal in structure. Tricuspid valve regurgitation is moderate to severe. Aortic Valve: The aortic valve is tricuspid. Aortic valve regurgitation is not visualized. Mild to moderate aortic valve sclerosis/calcification is present, without any evidence of aortic stenosis. Aortic valve mean gradient measures 5.4 mmHg. Aortic valve peak gradient measures 9.4 mmHg. Aortic valve area, by VTI measures 1.38 cm. Pulmonic Valve: The pulmonic valve was normal in structure. Pulmonic valve regurgitation is not visualized. Aorta: The aortic root is normal in size and structure and aortic dilatation noted. There is mild dilatation  of the ascending aorta, measuring 40 mm. Venous: The inferior vena cava is dilated in size with less than 50% respiratory variability, suggesting right atrial pressure of 15 mmHg. IAS/Shunts: No atrial level shunt detected by color flow Doppler.  LEFT VENTRICLE PLAX 2D LVIDd:         3.90 cm LVIDs:         2.70 cm LV PW:         1.70 cm LV IVS:        1.40 cm LVOT diam:     1.90 cm LV SV:         36 LV SV Index:   20 LVOT Area:     2.84 cm  LV Volumes (MOD) LV vol d, MOD A2C: 78.1 ml LV vol d, MOD A4C: 69.7 ml LV vol s, MOD A2C: 49.2 ml LV vol s, MOD A4C: 42.3 ml LV SV MOD A2C:     28.9 ml LV SV MOD A4C:     69.7 ml LV SV MOD BP:      28.1 ml RIGHT VENTRICLE            IVC RV Basal diam:  3.40 cm    IVC diam: 2.50 cm RV S prime:     8.38 cm/s TAPSE (M-mode): 1.2 cm LEFT ATRIUM              Index        RIGHT ATRIUM           Index LA diam:        5.20 cm  2.87 cm/m   RA Area:     35.80 cm LA Vol (A2C):   288.0 ml 158.99 ml/m RA Volume:   122.00 ml 67.35 ml/m LA Vol (A4C):   189.0 ml 104.33 ml/m LA Biplane Vol: 238.0 ml 131.38 ml/m  AORTIC VALVE AV Area (Vmax):    1.33 cm AV Area (Vmean):   1.29 cm AV Area (VTI):     1.38 cm AV Vmax:           153.26 cm/s AV Vmean:          105.920 cm/s AV VTI:            0.262 m AV Peak Grad:      9.4 mmHg AV Mean Grad:      5.4 mmHg LVOT Vmax:         72.00 cm/s LVOT Vmean:        48.267 cm/s LVOT VTI:          0.127 m LVOT/AV VTI ratio: 0.49  AORTA Ao Root diam: 3.20 cm Ao Asc diam:  4.00 cm MITRAL VALVE           TRICUSPID VALVE MV Mean grad: 4.0 mmHg TR Peak grad:   37.7 mmHg                        TR Vmax:        307.00 cm/s                         SHUNTS                        Systemic VTI:  0.13 m                        Systemic Diam: 1.90 cm Dorris Carnes MD Electronically signed by Dorris Carnes MD Signature Date/Time: 02/10/2021/5:02:11 PM    Final      The results of significant diagnostics from this hospitalization (including imaging, microbiology, ancillary and  laboratory) are listed below for reference.     Microbiology: Recent Results (from the past 240 hour(s))  Blood culture (routine x 2)     Status: Abnormal   Collection Time: 02/05/21  6:15 PM   Specimen: BLOOD  Result Value Ref Range Status   Specimen Description BLOOD SITE NOT SPECIFIED  Final   Special Requests   Final    BOTTLES DRAWN AEROBIC AND ANAEROBIC Blood Culture results may not be optimal due to an inadequate volume of blood received in culture bottles   Culture  Setup Time   Final    GRAM POSITIVE COCCI IN BOTH AEROBIC AND ANAEROBIC BOTTLES CRITICAL RESULT CALLED TO, READ BACK BY AND VERIFIED WITH: PHARMD T. DANG [phone] FCP    Culture (A)  Final    STAPHYLOCOCCUS HOMINIS THE SIGNIFICANCE OF ISOLATING THIS ORGANISM FROM A SINGLE SET OF BLOOD CULTURES WHEN MULTIPLE SETS ARE DRAWN IS UNCERTAIN. PLEASE NOTIFY THE MICROBIOLOGY DEPARTMENT WITHIN ONE WEEK IF SPECIATION AND SENSITIVITIES ARE REQUIRED. Performed at [facility], [place] [address] [place], [place] [address]    Report Status 02/08/2021 FINAL  Final  Blood Culture ID Panel (Reflexed)     Status: Abnormal   Collection Time: 02/05/21  6:15 PM  Result Value Ref Range Status   Enterococcus faecalis NOT DETECTED NOT DETECTED Final   Enterococcus Faecium NOT DETECTED  NOT DETECTED Final   Listeria monocytogenes NOT DETECTED NOT DETECTED Final   Staphylococcus species DETECTED (A) NOT DETECTED Final    Comment: CRITICAL RESULT CALLED TO, READ BACK BY AND VERIFIED WITH: PHARMD T. DANG [phone] FCP    Staphylococcus aureus (BCID) NOT DETECTED NOT DETECTED Final   Staphylococcus epidermidis NOT DETECTED NOT DETECTED Final   Staphylococcus lugdunensis NOT DETECTED NOT DETECTED Final   Streptococcus species NOT DETECTED NOT DETECTED Final   Streptococcus agalactiae NOT DETECTED NOT DETECTED Final   Streptococcus pneumoniae NOT DETECTED NOT DETECTED Final   Streptococcus pyogenes NOT DETECTED NOT DETECTED Final    A.calcoaceticus-baumannii NOT DETECTED NOT DETECTED Final   Bacteroides fragilis NOT DETECTED NOT DETECTED Final   Enterobacterales NOT DETECTED NOT DETECTED Final   Enterobacter cloacae complex NOT DETECTED NOT DETECTED Final   Escherichia coli NOT DETECTED NOT DETECTED Final   Klebsiella aerogenes NOT DETECTED NOT DETECTED Final   Klebsiella oxytoca NOT DETECTED NOT DETECTED Final   Klebsiella pneumoniae NOT DETECTED NOT DETECTED Final   Proteus species NOT DETECTED NOT DETECTED Final   Salmonella species NOT DETECTED NOT DETECTED Final   Serratia marcescens NOT DETECTED NOT DETECTED Final   Haemophilus influenzae NOT DETECTED NOT DETECTED Final   Neisseria meningitidis NOT DETECTED NOT DETECTED Final   Pseudomonas aeruginosa NOT DETECTED NOT DETECTED Final   Stenotrophomonas maltophilia NOT DETECTED NOT DETECTED Final   Candida albicans NOT DETECTED NOT DETECTED Final   Candida auris NOT DETECTED NOT DETECTED Final   Candida glabrata NOT DETECTED NOT DETECTED Final   Candida krusei NOT DETECTED NOT DETECTED Final   Candida parapsilosis NOT DETECTED NOT DETECTED Final   Candida tropicalis NOT DETECTED NOT DETECTED Final   Cryptococcus neoformans/gattii NOT DETECTED NOT DETECTED Final    Comment: Performed at [facility], [place] [address], [place], [place] [address]  Blood culture (routine x 2)     Status: None   Collection Time: 02/05/21  7:17 PM   Specimen: BLOOD  Result Value Ref Range Status   Specimen Description BLOOD SITE NOT SPECIFIED  Final   Special Requests   Final    BOTTLES DRAWN AEROBIC AND ANAEROBIC Blood Culture adequate volume   Culture   Final    NO GROWTH 5 DAYS Performed at [facility], [address], [place], [place] [address]    Report Status 02/10/2021 FINAL  Final  Resp Panel by RT-PCR (Flu A&B, Covid) Nasopharyngeal Swab     Status: None   Collection Time: 02/05/21 10:30 PM   Specimen: Nasopharyngeal Swab; Nasopharyngeal(NP) swabs in vial  transport medium  Result Value Ref Range Status   SARS Coronavirus 2 by RT PCR NEGATIVE NEGATIVE Final    Comment: (NOTE) SARS-CoV-2 target nucleic acids are NOT DETECTED.  The SARS-CoV-2 RNA is generally detectable in upper respiratory specimens during the acute phase of infection. The lowest concentration of SARS-CoV-2 viral copies this assay can detect is 138 copies/mL. A negative result does not preclude SARS-Cov-2 infection and should not be used as the sole basis for treatment or other patient management decisions. A negative result may occur with  improper specimen collection/handling, submission of specimen other than nasopharyngeal swab, presence of viral mutation(s) within the areas targeted by this assay, and inadequate number of viral copies(<138 copies/mL). A negative result must be combined with clinical observations, patient history, and epidemiological information. The expected result is Negative.  Fact Sheet for Patients:  EntrepreneurPulse.com.au  Fact Sheet for Healthcare  Providers:  IncredibleEmployment.be  This test is no t yet approved or cleared by the [place] and  has been authorized for detection and/or diagnosis of SARS-CoV-2 by FDA under an Emergency Use Authorization (EUA). This EUA will remain  in effect (meaning this test can be used) for the duration of the COVID-19 declaration under Section 564(b)(1) of the Act, 21 U.S.C.section 360bbb-3(b)(1), unless the authorization is terminated  or revoked sooner.       Influenza A by PCR NEGATIVE NEGATIVE Final   Influenza B by PCR NEGATIVE NEGATIVE Final    Comment: (NOTE) The Xpert Xpress SARS-CoV-2/FLU/RSV plus assay is intended as an aid in the diagnosis of influenza from Nasopharyngeal swab specimens and should not be used as a sole basis for treatment. Nasal washings and aspirates are unacceptable for Xpert Xpress SARS-CoV-2/FLU/RSV testing.  Fact  Sheet for Patients: EntrepreneurPulse.com.au  Fact Sheet for Healthcare Providers: IncredibleEmployment.be  This test is not yet approved or cleared by the [place] FDA and has been authorized for detection and/or diagnosis of SARS-CoV-2 by FDA under an Emergency Use Authorization (EUA). This EUA will remain in effect (meaning this test can be used) for the duration of the COVID-19 declaration under Section 564(b)(1) of the Act, 21 U.S.C. section 360bbb-3(b)(1), unless the authorization is terminated or revoked.  Performed at [facility], [place] [address] [place], [place] [address]   Culture, blood (routine x 2)     Status: None   Collection Time: 02/06/21  6:50 PM   Specimen: BLOOD RIGHT WRIST  Result Value Ref Range Status   Specimen Description BLOOD RIGHT WRIST  Final   Special Requests AEROBIC BOTTLE ONLY Blood Culture adequate volume  Final   Culture   Final    NO GROWTH 5 DAYS Performed at [facility], [place] [address] [place], [place] [address]    Report Status 02/11/2021 FINAL  Final  Culture, blood (routine x 2)     Status: None   Collection Time: 02/06/21  7:06 PM   Specimen: BLOOD RIGHT WRIST  Result Value Ref Range Status   Specimen Description BLOOD RIGHT WRIST  Final   Special Requests AEROBIC BOTTLE ONLY Blood Culture adequate volume  Final   Culture   Final    NO GROWTH 5 DAYS Performed at [facility], [place] [address] [place], [place] [address]    Report Status 02/11/2021 FINAL  Final  Culture, blood (routine x 2)     Status: None (Preliminary result)   Collection Time: 02/08/21  9:55 PM   Specimen: BLOOD LEFT HAND  Result Value Ref Range Status   Specimen Description BLOOD LEFT HAND  Final   Special Requests   Final    BOTTLES DRAWN AEROBIC AND ANAEROBIC Blood Culture adequate volume   Culture   Final    NO GROWTH 4 DAYS Performed at [facility], [place] [address] [place], [place]  [address]    Report Status PENDING  Incomplete  Culture, blood (routine x 2)     Status: Abnormal   Collection Time: 02/08/21 10:04 PM   Specimen: BLOOD RIGHT HAND  Result Value Ref Range Status   Specimen Description BLOOD RIGHT HAND  Final   Special Requests   Final    BOTTLES DRAWN AEROBIC AND ANAEROBIC Blood Culture adequate volume   Culture  Setup Time   Final    AEROBIC BOTTLE ONLY GRAM POSITIVE COCCI CRITICAL VALUE NOTED.  VALUE IS CONSISTENT  WITH PREVIOUSLY REPORTED AND CALLED VALUE. Performed at [facility], [place] [address], [place], [place] [address]    Culture STAPHYLOCOCCUS WARNERI (A)  Final   Report Status 02/12/2021 FINAL  Final   Organism ID, Bacteria STAPHYLOCOCCUS WARNERI  Final      Susceptibility   Staphylococcus warneri - MIC*    CIPROFLOXACIN <=0.5 SENSITIVE Sensitive     ERYTHROMYCIN >=8 RESISTANT Resistant     GENTAMICIN <=0.5 SENSITIVE Sensitive     OXACILLIN >=4 RESISTANT Resistant     TETRACYCLINE <=1 SENSITIVE Sensitive     VANCOMYCIN <=0.5 SENSITIVE Sensitive     TRIMETH/SULFA <=10 SENSITIVE Sensitive     CLINDAMYCIN <=0.25 SENSITIVE Sensitive     RIFAMPIN <=0.5 SENSITIVE Sensitive     Inducible Clindamycin NEGATIVE Sensitive     * STAPHYLOCOCCUS WARNERI  Culture, blood (routine x 2)     Status: None (Preliminary result)   Collection Time: 02/11/21  7:50 AM   Specimen: BLOOD  Result Value Ref Range Status   Specimen Description BLOOD LEFT ANTECUBITAL  Final   Special Requests   Final    BOTTLES DRAWN AEROBIC ONLY Blood Culture adequate volume   Culture   Final    NO GROWTH < 24 HOURS Performed at [facility], [address], [place], [place] [address]    Report Status PENDING  Incomplete  Culture, blood (routine x 2)     Status: None (Preliminary result)   Collection Time: 02/11/21  7:51 AM   Specimen: BLOOD  Result Value Ref Range Status   Specimen Description BLOOD RIGHT ANTECUBITAL  Final   Special Requests   Final    BOTTLES  DRAWN AEROBIC ONLY Blood Culture results may not be optimal due to an excessive volume of blood received in culture bottles   Culture   Final    NO GROWTH < 24 HOURS Performed at [facility], [place] [address] [place], [place] [address]    Report Status PENDING  Incomplete     Labs: BNP (last 3 results) No results for input(s): BNP in the last 8760 hours. Basic Metabolic Panel: Recent Labs  Lab 02/05/21 1814 02/05/21 1911 02/06/21 0705 02/07/21 0413 02/08/21 0209  NA 135 136 139 136 136  K 4.1 4.2 3.8 3.7 4.0  CL 102 102 103 103 102  CO2 27  --  27 27 28   GLUCOSE 152* 153* 113* 112* 155*  BUN 16 17 13 10 13   CREATININE 1.09* 1.10* 0.90 0.94 0.96  CALCIUM 9.7  --  9.5 9.3 9.3  MG  --   --  1.7  --   --    Liver Function Tests: Recent Labs  Lab 02/05/21 1814  AST 21  ALT 13  ALKPHOS 61  BILITOT 1.0  PROT 6.9  ALBUMIN 3.7   Recent Labs  Lab 02/05/21 1814  LIPASE 47   No results for input(s): AMMONIA in the last 168 hours. CBC: Recent Labs  Lab 02/05/21 1814 02/05/21 1911 02/06/21 0705 02/07/21 0413 02/08/21 0209  WBC 8.5  --  6.9 6.8 7.3  NEUTROABS 6.4  --   --   --   --   HGB 10.7* 11.2* 10.0* 9.9* 9.6*  HCT 31.4* 33.0* 29.2* 29.1* 28.3*  MCV 80.1  --  79.3* 79.7* 78.8*  PLT 261  --  238 230 240   Cardiac Enzymes: Recent Labs  Lab 02/10/21 0120  CKTOTAL 37*   BNP: Invalid input(s): POCBNP CBG:  Recent Labs  Lab 02/11/21 0622 02/11/21 1131 02/11/21 1555 02/11/21 2121 02/12/21 0615  GLUCAP 136* 180* 100* 129* 113*   D-Dimer No results for input(s): DDIMER in the last 72 hours. Hgb A1c No results for input(s): HGBA1C in the last 72 hours. Lipid Profile No results for input(s): CHOL, HDL, LDLCALC, TRIG, CHOLHDL, LDLDIRECT in the last 72 hours. Thyroid function studies No results for input(s): TSH, T4TOTAL, T3FREE, THYROIDAB in the last 72 hours.  Invalid input(s): FREET3 Anemia work up Recent Labs    02/10/21 0120  FERRITIN 80    Urinalysis    Component Value Date/Time   COLORURINE YELLOW 02/09/2021 [place] 02/09/2021 0917   LABSPEC 1.008 02/09/2021 0917   PHURINE 6.0 02/09/2021 0917   GLUCOSEU NEGATIVE 02/09/2021 0917   GLUCOSEU NEGATIVE 04/27/2019 1041   HGBUR LARGE (A) 02/09/2021 0917   BILIRUBINUR NEGATIVE 02/09/2021 0917   KETONESUR NEGATIVE 02/09/2021 0917   PROTEINUR NEGATIVE 02/09/2021 0917   UROBILINOGEN 0.2 04/27/2019 1041   NITRITE NEGATIVE 02/09/2021 0917   LEUKOCYTESUR NEGATIVE 02/09/2021 0917   Sepsis Labs Invalid input(s): PROCALCITONIN,  WBC,  LACTICIDVEN Microbiology Recent Results (from the past 240 hour(s))  Blood culture (routine x 2)     Status: Abnormal   Collection Time: 02/05/21  6:15 PM   Specimen: BLOOD  Result Value Ref Range Status   Specimen Description BLOOD SITE NOT SPECIFIED  Final   Special Requests   Final    BOTTLES DRAWN AEROBIC AND ANAEROBIC Blood Culture results may not be optimal due to an inadequate volume of blood received in culture bottles   Culture  Setup Time   Final    GRAM POSITIVE COCCI IN BOTH AEROBIC AND ANAEROBIC BOTTLES CRITICAL RESULT CALLED TO, READ BACK BY AND VERIFIED WITH: PHARMD T. DANG [phone] FCP    Culture (A)  Final    STAPHYLOCOCCUS HOMINIS THE SIGNIFICANCE OF ISOLATING THIS ORGANISM FROM A SINGLE SET OF BLOOD CULTURES WHEN MULTIPLE SETS ARE DRAWN IS UNCERTAIN. PLEASE NOTIFY THE MICROBIOLOGY DEPARTMENT WITHIN ONE WEEK IF SPECIATION AND SENSITIVITIES ARE REQUIRED. Performed at [facility], [place] [address] [place], [place] [address]    Report Status 02/08/2021 FINAL  Final  Blood Culture ID Panel (Reflexed)     Status: Abnormal   Collection Time: 02/05/21  6:15 PM  Result Value Ref Range Status   Enterococcus faecalis NOT DETECTED NOT DETECTED Final   Enterococcus Faecium NOT DETECTED NOT DETECTED Final   Listeria monocytogenes NOT DETECTED NOT DETECTED Final   Staphylococcus species DETECTED (A) NOT  DETECTED Final    Comment: CRITICAL RESULT CALLED TO, READ BACK BY AND VERIFIED WITH: PHARMD T. DANG [phone] FCP    Staphylococcus aureus (BCID) NOT DETECTED NOT DETECTED Final   Staphylococcus epidermidis NOT DETECTED NOT DETECTED Final   Staphylococcus lugdunensis NOT DETECTED NOT DETECTED Final   Streptococcus species NOT DETECTED NOT DETECTED Final   Streptococcus agalactiae NOT DETECTED NOT DETECTED Final   Streptococcus pneumoniae NOT DETECTED NOT DETECTED Final   Streptococcus pyogenes NOT DETECTED NOT DETECTED Final   A.calcoaceticus-baumannii NOT DETECTED NOT DETECTED Final   Bacteroides fragilis NOT DETECTED NOT DETECTED Final   Enterobacterales NOT DETECTED NOT DETECTED Final   Enterobacter cloacae complex NOT DETECTED NOT DETECTED Final   Escherichia coli NOT DETECTED NOT DETECTED Final   Klebsiella aerogenes NOT DETECTED NOT DETECTED Final   Klebsiella oxytoca NOT DETECTED NOT DETECTED Final   Klebsiella pneumoniae NOT DETECTED NOT DETECTED  Final   Proteus species NOT DETECTED NOT DETECTED Final   Salmonella species NOT DETECTED NOT DETECTED Final   Serratia marcescens NOT DETECTED NOT DETECTED Final   Haemophilus influenzae NOT DETECTED NOT DETECTED Final   Neisseria meningitidis NOT DETECTED NOT DETECTED Final   Pseudomonas aeruginosa NOT DETECTED NOT DETECTED Final   Stenotrophomonas maltophilia NOT DETECTED NOT DETECTED Final   Candida albicans NOT DETECTED NOT DETECTED Final   Candida auris NOT DETECTED NOT DETECTED Final   Candida glabrata NOT DETECTED NOT DETECTED Final   Candida krusei NOT DETECTED NOT DETECTED Final   Candida parapsilosis NOT DETECTED NOT DETECTED Final   Candida tropicalis NOT DETECTED NOT DETECTED Final   Cryptococcus neoformans/gattii NOT DETECTED NOT DETECTED Final    Comment: Performed at [facility], [address] [place], [place] [address]  Blood culture (routine x 2)     Status: None   Collection Time: 02/05/21  7:17 PM    Specimen: BLOOD  Result Value Ref Range Status   Specimen Description BLOOD SITE NOT SPECIFIED  Final   Special Requests   Final    BOTTLES DRAWN AEROBIC AND ANAEROBIC Blood Culture adequate volume   Culture   Final    NO GROWTH 5 DAYS Performed at [facility], [address] [place], [place] [address]    Report Status 02/10/2021 FINAL  Final  Resp Panel by RT-PCR (Flu A&B, Covid) Nasopharyngeal Swab     Status: None   Collection Time: 02/05/21 10:30 PM   Specimen: Nasopharyngeal Swab; Nasopharyngeal(NP) swabs in vial transport medium  Result Value Ref Range Status   SARS Coronavirus 2 by RT PCR NEGATIVE NEGATIVE Final    Comment: (NOTE) SARS-CoV-2 target nucleic acids are NOT DETECTED.  The SARS-CoV-2 RNA is generally detectable in upper respiratory specimens during the acute phase of infection. The lowest concentration of SARS-CoV-2 viral copies this assay can detect is 138 copies/mL. A negative result does not preclude SARS-Cov-2 infection and should not be used as the sole basis for treatment or other patient management decisions. A negative result may occur with  improper specimen collection/handling, submission of specimen other than nasopharyngeal swab, presence of viral mutation(s) within the areas targeted by this assay, and inadequate number of viral copies(<138 copies/mL). A negative result must be combined with clinical observations, patient history, and epidemiological information. The expected result is Negative.  Fact Sheet for Patients:  EntrepreneurPulse.com.au  Fact Sheet for Healthcare Providers:  IncredibleEmployment.be  This test is no t yet approved or cleared by the [place] FDA and  has been authorized for detection and/or diagnosis of SARS-CoV-2 by FDA under an Emergency Use Authorization (EUA). This EUA will remain  in effect (meaning this test can be used) for the duration of the COVID-19 declaration  under Section 564(b)(1) of the Act, 21 U.S.C.section 360bbb-3(b)(1), unless the authorization is terminated  or revoked sooner.       Influenza A by PCR NEGATIVE NEGATIVE Final   Influenza B by PCR NEGATIVE NEGATIVE Final    Comment: (NOTE) The Xpert Xpress SARS-CoV-2/FLU/RSV plus assay is intended as an aid in the diagnosis of influenza from Nasopharyngeal swab specimens and should not be used as a sole basis for treatment. Nasal washings and aspirates are unacceptable for Xpert Xpress SARS-CoV-2/FLU/RSV testing.  Fact Sheet for Patients: EntrepreneurPulse.com.au  Fact Sheet for Healthcare Providers: IncredibleEmployment.be  This test is not yet approved or cleared by the [place] and has been authorized for  detection and/or diagnosis of SARS-CoV-2 by FDA under an Emergency Use Authorization (EUA). This EUA will remain in effect (meaning this test can be used) for the duration of the COVID-19 declaration under Section 564(b)(1) of the Act, 21 U.S.C. section 360bbb-3(b)(1), unless the authorization is terminated or revoked.  Performed at [facility], [place] [address] [place], [place] [address]   Culture, blood (routine x 2)     Status: None   Collection Time: 02/06/21  6:50 PM   Specimen: BLOOD RIGHT WRIST  Result Value Ref Range Status   Specimen Description BLOOD RIGHT WRIST  Final   Special Requests AEROBIC BOTTLE ONLY Blood Culture adequate volume  Final   Culture   Final    NO GROWTH 5 DAYS Performed at [facility], [place] [address] [place], [place] [address]    Report Status 02/11/2021 FINAL  Final  Culture, blood (routine x 2)     Status: None   Collection Time: 02/06/21  7:06 PM   Specimen: BLOOD RIGHT WRIST  Result Value Ref Range Status   Specimen Description BLOOD RIGHT WRIST  Final   Special Requests AEROBIC BOTTLE ONLY Blood Culture adequate volume  Final   Culture   Final    NO GROWTH 5  DAYS Performed at [facility], [place] [address], [place], [place] [address]    Report Status 02/11/2021 FINAL  Final  Culture, blood (routine x 2)     Status: None (Preliminary result)   Collection Time: 02/08/21  9:55 PM   Specimen: BLOOD LEFT HAND  Result Value Ref Range Status   Specimen Description BLOOD LEFT HAND  Final   Special Requests   Final    BOTTLES DRAWN AEROBIC AND ANAEROBIC Blood Culture adequate volume   Culture   Final    NO GROWTH 4 DAYS Performed at [facility], [place] [address], [place], [place] [address]    Report Status PENDING  Incomplete  Culture, blood (routine x 2)     Status: Abnormal   Collection Time: 02/08/21 10:04 PM   Specimen: BLOOD RIGHT HAND  Result Value Ref Range Status   Specimen Description BLOOD RIGHT HAND  Final   Special Requests   Final    BOTTLES DRAWN AEROBIC AND ANAEROBIC Blood Culture adequate volume   Culture  Setup Time   Final    AEROBIC BOTTLE ONLY GRAM POSITIVE COCCI CRITICAL VALUE NOTED.  VALUE IS CONSISTENT WITH PREVIOUSLY REPORTED AND CALLED VALUE. Performed at [facility], [place] [address] [place],  [address]    Culture STAPHYLOCOCCUS WARNERI (A)  Final   Report Status 02/12/2021 FINAL  Final   Organism ID, Bacteria STAPHYLOCOCCUS WARNERI  Final      Susceptibility   Staphylococcus warneri - MIC*    CIPROFLOXACIN <=0.5 SENSITIVE Sensitive     ERYTHROMYCIN >=8 RESISTANT Resistant     GENTAMICIN <=0.5 SENSITIVE Sensitive     OXACILLIN >=4 RESISTANT Resistant     TETRACYCLINE <=1 SENSITIVE Sensitive     VANCOMYCIN <=0.5 SENSITIVE Sensitive     TRIMETH/SULFA <=10 SENSITIVE Sensitive     CLINDAMYCIN <=0.25 SENSITIVE Sensitive     RIFAMPIN <=0.5 SENSITIVE Sensitive     Inducible Clindamycin NEGATIVE Sensitive     * STAPHYLOCOCCUS WARNERI  Culture, blood (routine x 2)     Status: None (Preliminary result)   Collection Time: 02/11/21  7:50 AM   Specimen: BLOOD  Result Value Ref Range Status  Specimen Description BLOOD LEFT ANTECUBITAL  Final   Special Requests   Final    BOTTLES DRAWN AEROBIC ONLY Blood Culture adequate volume   Culture   Final    NO GROWTH < 24 HOURS Performed at [facility], [address] [place], [place] [address]    Report Status PENDING  Incomplete  Culture, blood (routine x 2)     Status: None (Preliminary result)   Collection Time: 02/11/21  7:51 AM   Specimen: BLOOD  Result Value Ref Range Status   Specimen Description BLOOD RIGHT ANTECUBITAL  Final   Special Requests   Final    BOTTLES DRAWN AEROBIC ONLY Blood Culture results may not be optimal due to an excessive volume of blood received in culture bottles   Culture   Final    NO GROWTH < 24 HOURS Performed at [facility], [place] [address] [place],  [address]    Report Status PENDING  Incomplete     Time coordinating discharge in minutes: 65  SIGNED:   Debbe Odea, MD  [facility] 02/12/2021, 4:21 PM

## 2021-02-12 NOTE — Care Management Important Message (Signed)
Important Message  Patient Details  Name: Beth Miller MRN: [id number] Date of Birth: [birth year]   Medicare Important Message Given:  Yes     Shelda Altes 02/12/2021, 11:55 AM

## 2021-02-12 NOTE — Plan of Care (Signed)
°  Problem: Clinical Measurements: °Goal: Will remain free from infection °Outcome: Progressing °  °Problem: Activity: °Goal: Risk for activity intolerance will decrease °Outcome: Progressing °  °Problem: Nutrition: °Goal: Adequate nutrition will be maintained °Outcome: Progressing °  °

## 2021-02-12 NOTE — Assessment & Plan Note (Signed)
On diet, exercise 

## 2021-02-13 LAB — CULTURE, BLOOD (ROUTINE X 2)
Culture: NO GROWTH
Special Requests: ADEQUATE

## 2021-02-13 LAB — FANA STAINING PATTERNS: Homogeneous Pattern: 1

## 2021-02-13 LAB — CRYOGLOBULIN

## 2021-02-13 LAB — ANTINUCLEAR ANTIBODIES, IFA: ANA Ab, IFA: POSITIVE — AB

## 2021-02-14 ENCOUNTER — Emergency Department (HOSPITAL_COMMUNITY): Payer: Medicare Other

## 2021-02-14 ENCOUNTER — Other Ambulatory Visit: Payer: Self-pay

## 2021-02-14 ENCOUNTER — Encounter (HOSPITAL_COMMUNITY): Payer: Self-pay | Admitting: Emergency Medicine

## 2021-02-14 ENCOUNTER — Observation Stay (HOSPITAL_COMMUNITY)
Admission: EM | Admit: 2021-02-14 | Discharge: 2021-02-15 | Disposition: A | Discharge status: Home / Self Care | Payer: Medicare Other | Attending: Internal Medicine | Admitting: Internal Medicine

## 2021-02-14 ENCOUNTER — Telehealth: Payer: Self-pay

## 2021-02-14 DIAGNOSIS — Z7901 Long term (current) use of anticoagulants: Secondary | ICD-10-CM | POA: Insufficient documentation

## 2021-02-14 DIAGNOSIS — Z20822 Contact with and (suspected) exposure to covid-19: Secondary | ICD-10-CM | POA: Insufficient documentation

## 2021-02-14 DIAGNOSIS — E118 Type 2 diabetes mellitus with unspecified complications: Secondary | ICD-10-CM

## 2021-02-14 DIAGNOSIS — Z8673 Personal history of transient ischemic attack (TIA), and cerebral infarction without residual deficits: Secondary | ICD-10-CM | POA: Diagnosis not present

## 2021-02-14 DIAGNOSIS — Z66 Do not resuscitate: Secondary | ICD-10-CM | POA: Insufficient documentation

## 2021-02-14 DIAGNOSIS — I110 Hypertensive heart disease with heart failure: Secondary | ICD-10-CM | POA: Diagnosis not present

## 2021-02-14 DIAGNOSIS — I4891 Unspecified atrial fibrillation: Principal | ICD-10-CM | POA: Diagnosis present

## 2021-02-14 DIAGNOSIS — Z87891 Personal history of nicotine dependence: Secondary | ICD-10-CM | POA: Diagnosis not present

## 2021-02-14 DIAGNOSIS — E119 Type 2 diabetes mellitus without complications: Secondary | ICD-10-CM | POA: Insufficient documentation

## 2021-02-14 DIAGNOSIS — I517 Cardiomegaly: Secondary | ICD-10-CM | POA: Diagnosis not present

## 2021-02-14 DIAGNOSIS — I5032 Chronic diastolic (congestive) heart failure: Secondary | ICD-10-CM | POA: Diagnosis not present

## 2021-02-14 DIAGNOSIS — Z9889 Other specified postprocedural states: Secondary | ICD-10-CM

## 2021-02-14 DIAGNOSIS — Z954 Presence of other heart-valve replacement: Secondary | ICD-10-CM | POA: Insufficient documentation

## 2021-02-14 DIAGNOSIS — R0789 Other chest pain: Secondary | ICD-10-CM | POA: Insufficient documentation

## 2021-02-14 DIAGNOSIS — M48061 Spinal stenosis, lumbar region without neurogenic claudication: Secondary | ICD-10-CM | POA: Insufficient documentation

## 2021-02-14 DIAGNOSIS — Z7984 Long term (current) use of oral hypoglycemic drugs: Secondary | ICD-10-CM | POA: Insufficient documentation

## 2021-02-14 DIAGNOSIS — I10 Essential (primary) hypertension: Secondary | ICD-10-CM | POA: Diagnosis present

## 2021-02-14 DIAGNOSIS — A419 Sepsis, unspecified organism: Secondary | ICD-10-CM | POA: Diagnosis not present

## 2021-02-14 DIAGNOSIS — Z79899 Other long term (current) drug therapy: Secondary | ICD-10-CM | POA: Diagnosis not present

## 2021-02-14 DIAGNOSIS — M545 Low back pain, unspecified: Secondary | ICD-10-CM | POA: Diagnosis not present

## 2021-02-14 DIAGNOSIS — R509 Fever, unspecified: Secondary | ICD-10-CM | POA: Diagnosis not present

## 2021-02-14 LAB — I-STAT CHEM 8, ED
BUN: 16 mg/dL (ref 8–23)
Calcium, Ion: 1.27 mmol/L (ref 1.15–1.40)
Chloride: 100 mmol/L (ref 98–111)
Creatinine, Ser: 1 mg/dL (ref 0.44–1.00)
Glucose, Bld: 134 mg/dL — ABNORMAL HIGH (ref 70–99)
HCT: 28 % — ABNORMAL LOW (ref 36.0–46.0)
Hemoglobin: 9.5 g/dL — ABNORMAL LOW (ref 12.0–15.0)
Potassium: 3.9 mmol/L (ref 3.5–5.1)
Sodium: 136 mmol/L (ref 135–145)
TCO2: 26 mmol/L (ref 22–32)

## 2021-02-14 LAB — CBC WITH DIFFERENTIAL/PLATELET
Abs Immature Granulocytes: 0.02 10*3/uL (ref 0.00–0.07)
Basophils Absolute: 0 10*3/uL (ref 0.0–0.1)
Basophils Relative: 1 %
Eosinophils Absolute: 0 10*3/uL (ref 0.0–0.5)
Eosinophils Relative: 0 %
HCT: 28.5 % — ABNORMAL LOW (ref 36.0–46.0)
Hemoglobin: 9.6 g/dL — ABNORMAL LOW (ref 12.0–15.0)
Immature Granulocytes: 0 %
Lymphocytes Relative: 16 %
Lymphs Abs: 1.4 10*3/uL (ref 0.7–4.0)
MCH: 26.7 pg (ref 26.0–34.0)
MCHC: 33.7 g/dL (ref 30.0–36.0)
MCV: 79.4 fL — ABNORMAL LOW (ref 80.0–100.0)
Monocytes Absolute: 1.3 10*3/uL — ABNORMAL HIGH (ref 0.1–1.0)
Monocytes Relative: 16 %
Neutro Abs: 5.6 10*3/uL (ref 1.7–7.7)
Neutrophils Relative %: 67 %
Platelets: 409 10*3/uL — ABNORMAL HIGH (ref 150–400)
RBC: 3.59 MIL/uL — ABNORMAL LOW (ref 3.87–5.11)
RDW: 15.5 % (ref 11.5–15.5)
WBC: 8.4 10*3/uL (ref 4.0–10.5)
nRBC: 0 % (ref 0.0–0.2)

## 2021-02-14 LAB — LACTIC ACID, PLASMA
Lactic Acid, Venous: 0.7 mmol/L (ref 0.5–1.9)
Lactic Acid, Venous: 1.2 mmol/L (ref 0.5–1.9)

## 2021-02-14 LAB — MAGNESIUM: Magnesium: 1.8 mg/dL (ref 1.7–2.4)

## 2021-02-14 LAB — COMPREHENSIVE METABOLIC PANEL
ALT: 32 U/L (ref 0–44)
AST: 26 U/L (ref 15–41)
Albumin: 3.5 g/dL (ref 3.5–5.0)
Alkaline Phosphatase: 98 U/L (ref 38–126)
Anion gap: 11 (ref 5–15)
BUN: 14 mg/dL (ref 8–23)
CO2: 24 mmol/L (ref 22–32)
Calcium: 9.6 mg/dL (ref 8.9–10.3)
Chloride: 100 mmol/L (ref 98–111)
Creatinine, Ser: 1.06 mg/dL — ABNORMAL HIGH (ref 0.44–1.00)
GFR, Estimated: 53 mL/min — ABNORMAL LOW (ref >60)
Glucose, Bld: 130 mg/dL — ABNORMAL HIGH (ref 70–99)
Potassium: 3.9 mmol/L (ref 3.5–5.1)
Sodium: 135 mmol/L (ref 135–145)
Total Bilirubin: 1.3 mg/dL — ABNORMAL HIGH (ref 0.3–1.2)
Total Protein: 7.4 g/dL (ref 6.5–8.1)

## 2021-02-14 LAB — RESP PANEL BY RT-PCR (FLU A&B, COVID) ARPGX2
Influenza A by PCR: NEGATIVE
Influenza B by PCR: NEGATIVE
SARS Coronavirus 2 by RT PCR: NEGATIVE

## 2021-02-14 LAB — PROTIME-INR
INR: 1.6 — ABNORMAL HIGH (ref 0.8–1.2)
Prothrombin Time: 19.3 seconds — ABNORMAL HIGH (ref 11.4–15.2)

## 2021-02-14 MED ORDER — GADOBUTROL 1 MMOL/ML IV SOLN
8.0000 mL | Freq: Once | INTRAVENOUS | Status: AC | PRN
  Administered 2021-02-14: 8 mL via INTRAVENOUS

## 2021-02-14 MED ORDER — SODIUM CHLORIDE 0.9 % IV BOLUS
1000.0000 mL | Freq: Once | INTRAVENOUS | Status: AC
  Administered 2021-02-14: 1000 mL via INTRAVENOUS

## 2021-02-14 MED ORDER — FENTANYL CITRATE (PF) 100 MCG/2ML IJ SOLN
25.0000 ug | Freq: Once | INTRAMUSCULAR | Status: AC
  Administered 2021-02-14: 25 ug via INTRAVENOUS
  Filled 2021-02-14: qty 2

## 2021-02-14 MED ORDER — ACETAMINOPHEN 325 MG PO TABS
650.0000 mg | ORAL_TABLET | Freq: Once | ORAL | Status: AC
  Administered 2021-02-14: 650 mg via ORAL
  Filled 2021-02-14: qty 2

## 2021-02-14 MED ORDER — SODIUM CHLORIDE 0.9 % IV BOLUS
1000.0000 mL | Freq: Once | INTRAVENOUS | Status: AC
  Administered 2021-02-15: 1000 mL via INTRAVENOUS

## 2021-02-14 MED ORDER — DILTIAZEM HCL-DEXTROSE 125-5 MG/125ML-% IV SOLN (PREMIX)
5.0000 mg/h | INTRAVENOUS | Status: DC
  Administered 2021-02-14: 5 mg/h via INTRAVENOUS
  Filled 2021-02-14: qty 125

## 2021-02-14 MED ORDER — DILTIAZEM HCL 25 MG/5ML IV SOLN
20.0000 mg | Freq: Once | INTRAVENOUS | Status: AC
  Administered 2021-02-14: 20 mg via INTRAVENOUS
  Filled 2021-02-14: qty 5

## 2021-02-14 NOTE — ED Triage Notes (Signed)
Pt here from home with c/o low back pain , while hooking pt up tom monitor , -pt HR was found to be 192 SVT along with a fever

## 2021-02-14 NOTE — Telephone Encounter (Signed)
Transition Care Management Unsuccessful Follow-up Telephone Call  Date of discharge and from where:  02/12/2021 from [facility]  Attempts:  2nd Attempt  Reason for unsuccessful TCM follow-up call:  Spoke with patient. Patient was having issues with talking and said that her heart was fluttering and that she did not feel right.  Stated that she was in a lot of pain.  Patient was not alone, her husband was at home with her. I advised the patient to call EMS and go to the nearest ER to be re-evaluated.  Dr Alain Marion was informed.

## 2021-02-14 NOTE — ED Notes (Signed)
This RN went with pt to MRI. Normal saline paused for time being but pt still on cardizem drip at 7.5.
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
## 2021-02-14 NOTE — Telephone Encounter (Signed)
Error

## 2021-02-14 NOTE — ED Triage Notes (Addendum)
Emergency Medicine Provider Triage Evaluation Note  Beth Miller , a 80 y.o. female  was evaluated in triage.  Pt complains of lower back pain.  Was discharged from the [facility] 2 days ago but reports she has not been feeling any better so came back.  Reports she has been having some fluttering in her heart intermittently, was noted to have a heart rate of 192 on arrival.  Patient with history of A. fib, recently admitted with A. fib with RVR in the setting of sepsis.  Did not take her Cardizem this morning.  Denies chest pain.  Patient will be roomed immediately for evaluation of tachycardia, patient also noted to be febrile.  Review of Systems  Positive: Back pain, palpitations Negative: Chest pain  Physical Exam  BP 115/74   Pulse (!) 192   Temp (!) 101.8 F (38.8 C) (Oral)   Resp (!) 24   LMP  (LMP Unknown)   SpO2 97%  Gen:   Awake, alert and in no acute distress HEENT:  Atraumatic  Resp:  Normal effort  Cardiac:  Tachycardia with rate in the 190s Abd:   Nondistended, nontender  MSK:   Moves extremities without difficult Neuro:  Speech clear   Medical Decision Making  Medically screening exam initiated at 6:23 PM.  Appropriate orders placed.  ROZINA POINTER was informed that the remainder of the evaluation will be completed by another provider, this initial triage assessment does not replace that evaluation, and the importance of remaining in the ED until their evaluation is complete.  Patient with emergent condition requiring immediate evaluation, heart rate in the 190s, will be immediately bedded in room 32, handoff to Dr. Darl Householder.  Clinical Impression  1.  Tachycardia 2.  Fever 3.  Back pain   Jacqlyn Larsen, [place] 02/14/21 1840    Jacqlyn Larsen, PA-C 02/14/21 [phone]

## 2021-02-14 NOTE — ED Notes (Signed)
Per Dr. Darl Householder titrate Cardizem to 7.5 now so pt can go to MRI

## 2021-02-14 NOTE — Telephone Encounter (Cosign Needed)
Transition Care Management Unsuccessful Follow-up Telephone Call  Date of discharge and from where:  02/12/2021 from [facility]   Attempts:  1st Attempt  Reason for unsuccessful TCM follow-up call:  Unable to leave message   Nurse Note:  Attempted to contact patient to discuss transition of care from inpatient admission.  Patient did not answer the phone.  Unable to leave voicemail as it was not set up.  Will attempt to call again or follow up.

## 2021-02-15 ENCOUNTER — Observation Stay (HOSPITAL_BASED_OUTPATIENT_CLINIC_OR_DEPARTMENT_OTHER): Payer: Medicare Other

## 2021-02-15 ENCOUNTER — Observation Stay (HOSPITAL_COMMUNITY): Payer: Medicare Other

## 2021-02-15 ENCOUNTER — Other Ambulatory Visit: Payer: Self-pay

## 2021-02-15 DIAGNOSIS — I129 Hypertensive chronic kidney disease with stage 1 through stage 4 chronic kidney disease, or unspecified chronic kidney disease: Secondary | ICD-10-CM | POA: Diagnosis not present

## 2021-02-15 DIAGNOSIS — M545 Low back pain, unspecified: Secondary | ICD-10-CM

## 2021-02-15 DIAGNOSIS — R509 Fever, unspecified: Secondary | ICD-10-CM | POA: Diagnosis not present

## 2021-02-15 DIAGNOSIS — N1831 Chronic kidney disease, stage 3a: Secondary | ICD-10-CM

## 2021-02-15 DIAGNOSIS — I4891 Unspecified atrial fibrillation: Secondary | ICD-10-CM

## 2021-02-15 DIAGNOSIS — E1122 Type 2 diabetes mellitus with diabetic chronic kidney disease: Secondary | ICD-10-CM | POA: Diagnosis not present

## 2021-02-15 DIAGNOSIS — I10 Essential (primary) hypertension: Secondary | ICD-10-CM

## 2021-02-15 DIAGNOSIS — Z952 Presence of prosthetic heart valve: Secondary | ICD-10-CM

## 2021-02-15 DIAGNOSIS — Z9889 Other specified postprocedural states: Secondary | ICD-10-CM | POA: Diagnosis not present

## 2021-02-15 LAB — GLUCOSE, CAPILLARY
Glucose-Capillary: 121 mg/dL — ABNORMAL HIGH (ref 70–99)
Glucose-Capillary: 131 mg/dL — ABNORMAL HIGH (ref 70–99)
Glucose-Capillary: 129 mg/dL — ABNORMAL HIGH (ref 70–99)
Glucose-Capillary: 121 mg/dL — ABNORMAL HIGH (ref 70–99)

## 2021-02-15 LAB — PROCALCITONIN: Procalcitonin: <0.1 ng/mL

## 2021-02-15 LAB — TROPONIN I (HIGH SENSITIVITY): Troponin I (High Sensitivity): 21 ng/L — ABNORMAL HIGH (ref <18)

## 2021-02-15 MED ORDER — ACETAMINOPHEN 325 MG PO TABS: 650.0000 mg | ORAL_TABLET | Freq: Four times a day (QID) | ORAL | Status: DC | PRN

## 2021-02-15 MED ORDER — INSULIN ASPART 100 UNIT/ML ~~LOC~~ SOLN
0.0000 [IU] | Freq: Every day | SUBCUTANEOUS | Status: DC
Start: 2021-02-15 — End: 2021-02-15

## 2021-02-15 MED ORDER — DILTIAZEM HCL ER COATED BEADS 240 MG PO CP24
240.0000 mg | ORAL_CAPSULE | Freq: Every day | ORAL | Status: DC
  Administered 2021-02-15: 240 mg via ORAL
  Filled 2021-02-15: qty 1

## 2021-02-15 MED ORDER — GABAPENTIN 100 MG PO CAPS
100.0000 mg | ORAL_CAPSULE | Freq: Every day | ORAL | Status: DC
  Administered 2021-02-15: 100 mg via ORAL
  Filled 2021-02-15: qty 1

## 2021-02-15 MED ORDER — WARFARIN - PHARMACIST DOSING INPATIENT: Freq: Every day | Status: DC

## 2021-02-15 MED ORDER — GABAPENTIN 100 MG PO CAPS: 100.0000 mg | ORAL_CAPSULE | Freq: Every day | ORAL | Status: DC

## 2021-02-15 MED ORDER — METOPROLOL SUCCINATE ER 25 MG PO TB24
25.0000 mg | ORAL_TABLET | Freq: Every day | ORAL | Status: DC
  Administered 2021-02-15: 25 mg via ORAL
  Filled 2021-02-15: qty 1

## 2021-02-15 MED ORDER — ACETAMINOPHEN 650 MG RE SUPP: 650.0000 mg | Freq: Four times a day (QID) | RECTAL | Status: DC | PRN

## 2021-02-15 MED ORDER — WARFARIN SODIUM 7.5 MG PO TABS
15.0000 mg | ORAL_TABLET | Freq: Once | ORAL | Status: AC
  Administered 2021-02-15: 15 mg via ORAL
  Filled 2021-02-15: qty 2

## 2021-02-15 MED ORDER — INSULIN ASPART 100 UNIT/ML ~~LOC~~ SOLN
0.0000 [IU] | Freq: Three times a day (TID) | SUBCUTANEOUS | Status: DC
Start: 2021-02-15 — End: 2021-02-15
  Administered 2021-02-15 (×3): 1 [IU] via SUBCUTANEOUS

## 2021-02-15 NOTE — Progress Notes (Signed)
PROGRESS NOTE    Beth Miller  ZOX:[id number] DOB: [birth year] DOA: 02/14/2021 PCP: Cassandria Anger, MD    Brief Narrative:  80 year old female with history of mechanical mitral valve on Coumadin not taking consistently, chronic diastolic heart failure, type 2 diabetes on oral hypoglycemics, hypertension who was recently admitted to the hospital on 4/11 for A. fib with RVR, abdominal pain, diarrhea sepsis and ongoing temperature.  She clinically improved.  Remained afebrile and was discharged with no source of infection found.  Patient went home was doing fairly well, started having back pain as well as palpitations so came back to the ER.  Patient was not aware that she is running temperature at home.  She was mostly asymptomatic other than low back pain as well heart rate controlled with IV Cardizem.  Hemodynamically stable in the ER.  Temperature 101.  MRI of the lumbar spine with severe stenosis but no evidence of any infection.  INR is still subtherapeutic 1.6.  Admitted with fever of unknown origin, A. fib with RVR.   Assessment & Plan:   Principal Problem:   Fever Active Problems:   DM2 (diabetes mellitus, type 2) (HCC)   Essential hypertension   MITRAL VALVE REPLACEMENT, HX OF   Atrial fibrillation with rapid ventricular response (HCC)  A. fib with RVR: Due to temperature.  Currently on 5 mcg of Cardizem IV.  Heart rate is controlled in sinus rhythm.  Resume home medication including Cardizem CD 240 mg and Toprol-XL 50 mg and discontinue Cardizem IV.  Occasional elevated heart rate with fever, no need to have strict control.  Will let patient have appropriate tachycardia. On Coumadin.  Pharmacy dosing.  Fever of unknown origin: Patient was found febrile on recent admission despite receiving Tylenol.  Extensive work-up on previous admission including CT scan of the chest abdomen pelvis, no findings TTE normal Left hip MRI, no evidence of infection HIV nonreactive,  hepatitis panel negative Blood culture staph hominis 1/2 thought to be contaminant COVID and influenza negative Procalcitonin normal. Patient comes back with fever, needs further work-up.  Discussed with infectious disease. Further work-up including MRI of the lumbar spine, no evidence of infection Blood cultures and urine cultures 4/20 pending.  Discontinue Tylenol, collect blood culture for temperature spike more than 100. PET scan to look for source of infection. Thyroid ultrasound with previous history of neck pain and abnormal thyroid ultrasound. Tender nodule back of left knee likely represents Baker's cyst, will check ultrasound. DVT studies both legs with subtherapeutic INR. TEE requested, can only be done on 4/25.  Scheduled. Repeat serological test including autoimmune testing as per ID team.  Mechanical mitral valve on Coumadin with subtherapeutic INR: Pharmacy dosing.  Counseled about compliance.  Chronic diastolic heart failure: Fairly euvolemic today.  She received 2 L fluid bolus in the ER.  Currently keeping off diuretics, will resume tomorrow.  Type 2 diabetes, well controlled with peripheral neuropathy:.  On sliding scale insulin.  Continue gabapentin.  Spinal stenosis: Severe spinal stenosis.  Not a surgical candidate at this time due to above work-up.  Symptomatic treatment.  Local pain medications.  No evidence of neurological compromise.    DVT prophylaxis:  warfarin (COUMADIN) tablet 15 mg   Code Status: DNR Family Communication: Husband at the bedside Disposition Plan: Status is: Observation  The patient will require care spanning > 2 midnights and should be moved to inpatient because: Ongoing diagnostic testing needed not appropriate for outpatient work up and Inpatient level of care  appropriate due to severity of illness  Dispo: The patient is from: Home              Anticipated d/c is to: Home              Patient currently is not medically stable to  d/c.   Difficult to place patient No         Consultants:   Infectious disease  Procedures:   None  Antimicrobials:   None   Subjective: Patient seen and examined.  Husband was at the bedside.  She has some vague back pain but no other issues.  Wants to go home.  Aware about temperature only when told by provider.    Objective: Vitals:   02/15/21 0300 02/15/21 0400 02/15/21 0500 02/15/21 1118  BP: 95/62 103/62 110/61 111/70  Pulse:   93 94  Resp: (!) 25 13 17 20   Temp:   98.3 F (36.8 C) 98.8 F (37.1 C)  TempSrc:   Oral Oral  SpO2:   95% 94%  Weight:      Height:        Intake/Output Summary (Last 24 hours) at 02/15/2021 1137 Last data filed at 02/15/2021 0231 Gross per 24 hour  Intake --  Output 1000 ml  Net -1000 ml   Filed Weights   02/15/21 0123  Weight: 80.2 kg    Examination:  General exam: Appears calm and comfortable  Not in any distress.  On room air. Respiratory system: Clear to auscultation. Respiratory effort normal.  No added sounds. Cardiovascular system: S1 & S2 heard, RRR. No JVD, murmurs, rubs, gallops or clicks. No pedal edema. Gastrointestinal system: Abdomen is nondistended, soft and nontender. No organomegaly or masses felt. Normal bowel sounds heard. Patient has some injection site pain on the right lower quadrant skin with previous heparin injections. Central nervous system: Alert and oriented. No focal neurological deficits. Extremities: Symmetric 5 x 5 power. Skin: No rashes, lesions or ulcers Psychiatry: Judgement and insight appear normal. Mood & affect appropriate.  Patient has 2 x 2 centimeter tender nodule on the back of left knee.  No skin changes.   Data Reviewed: I have personally reviewed following labs and imaging studies  CBC: Recent Labs  Lab 02/14/21 1834 02/14/21 1858  WBC 8.4  --   NEUTROABS 5.6  --   HGB 9.6* 9.5*  HCT 28.5* 28.0*  MCV 79.4*  --   PLT 409*  --    Basic Metabolic Panel: Recent  Labs  Lab 02/14/21 1834 02/14/21 1858  NA 135 136  K 3.9 3.9  CL 100 100  CO2 24  --   GLUCOSE 130* 134*  BUN 14 16  CREATININE 1.06* 1.00  CALCIUM 9.6  --   MG 1.8  --    GFR: Estimated Creatinine Clearance: 43.3 mL/min (by C-G formula based on SCr of 1 mg/dL). Liver Function Tests: Recent Labs  Lab 02/14/21 1834  AST 26  ALT 32  ALKPHOS 98  BILITOT 1.3*  PROT 7.4  ALBUMIN 3.5   No results for input(s): LIPASE, AMYLASE in the last 168 hours. No results for input(s): AMMONIA in the last 168 hours. Coagulation Profile: Recent Labs  Lab 02/09/21 0250 02/10/21 0120 02/11/21 0139 02/12/21 0146 02/14/21 1834  INR 2.3* 2.3* 2.5* 2.5* 1.6*   Cardiac Enzymes: Recent Labs  Lab 02/10/21 0120  CKTOTAL 37*   BNP (last 3 results) No results for input(s): PROBNP in the last 8760 hours. HbA1C: No  results for input(s): HGBA1C in the last 72 hours. CBG: Recent Labs  Lab 02/11/21 1555 02/11/21 2121 02/12/21 0615 02/15/21 0105 02/15/21 0551  GLUCAP 100* 129* 113* 121* 131*   Lipid Profile: No results for input(s): CHOL, HDL, LDLCALC, TRIG, CHOLHDL, LDLDIRECT in the last 72 hours. Thyroid Function Tests: No results for input(s): TSH, T4TOTAL, FREET4, T3FREE, THYROIDAB in the last 72 hours. Anemia Panel: No results for input(s): VITAMINB12, FOLATE, FERRITIN, TIBC, IRON, RETICCTPCT in the last 72 hours. Sepsis Labs: Recent Labs  Lab 02/08/21 2203 02/14/21 1834 02/14/21 2103 02/15/21 0401  PROCALCITON <0.10  --   --  <0.10  LATICACIDVEN  --  0.7 1.2  --     Recent Results (from the past 240 hour(s))  Blood culture (routine x 2)     Status: Abnormal   Collection Time: 02/05/21  6:15 PM   Specimen: BLOOD  Result Value Ref Range Status   Specimen Description BLOOD SITE NOT SPECIFIED  Final   Special Requests   Final    BOTTLES DRAWN AEROBIC AND ANAEROBIC Blood Culture results may not be optimal due to an inadequate volume of blood received in culture bottles    Culture  Setup Time   Final    GRAM POSITIVE COCCI IN BOTH AEROBIC AND ANAEROBIC BOTTLES CRITICAL RESULT CALLED TO, READ BACK BY AND VERIFIED WITH: PHARMD T. DANG [phone] FCP    Culture (A)  Final    STAPHYLOCOCCUS HOMINIS THE SIGNIFICANCE OF ISOLATING THIS ORGANISM FROM A SINGLE SET OF BLOOD CULTURES WHEN MULTIPLE SETS ARE DRAWN IS UNCERTAIN. PLEASE NOTIFY THE MICROBIOLOGY DEPARTMENT WITHIN ONE WEEK IF SPECIATION AND SENSITIVITIES ARE REQUIRED. Performed at [facility], [place] [address], [place], [place] [address]    Report Status 02/08/2021 FINAL  Final  Blood Culture ID Panel (Reflexed)     Status: Abnormal   Collection Time: 02/05/21  6:15 PM  Result Value Ref Range Status   Enterococcus faecalis NOT DETECTED NOT DETECTED Final   Enterococcus Faecium NOT DETECTED NOT DETECTED Final   Listeria monocytogenes NOT DETECTED NOT DETECTED Final   Staphylococcus species DETECTED (A) NOT DETECTED Final    Comment: CRITICAL RESULT CALLED TO, READ BACK BY AND VERIFIED WITH: PHARMD T. DANG [phone] [id number] FCP    Staphylococcus aureus (BCID) NOT DETECTED NOT DETECTED Final   Staphylococcus epidermidis NOT DETECTED NOT DETECTED Final   Staphylococcus lugdunensis NOT DETECTED NOT DETECTED Final   Streptococcus species NOT DETECTED NOT DETECTED Final   Streptococcus agalactiae NOT DETECTED NOT DETECTED Final   Streptococcus pneumoniae NOT DETECTED NOT DETECTED Final   Streptococcus pyogenes NOT DETECTED NOT DETECTED Final   A.calcoaceticus-baumannii NOT DETECTED NOT DETECTED Final   Bacteroides fragilis NOT DETECTED NOT DETECTED Final   Enterobacterales NOT DETECTED NOT DETECTED Final   Enterobacter cloacae complex NOT DETECTED NOT DETECTED Final   Escherichia coli NOT DETECTED NOT DETECTED Final   Klebsiella aerogenes NOT DETECTED NOT DETECTED Final   Klebsiella oxytoca NOT DETECTED NOT DETECTED Final   Klebsiella pneumoniae NOT DETECTED NOT DETECTED Final   Proteus species NOT  DETECTED NOT DETECTED Final   Salmonella species NOT DETECTED NOT DETECTED Final   Serratia marcescens NOT DETECTED NOT DETECTED Final   Haemophilus influenzae NOT DETECTED NOT DETECTED Final   Neisseria meningitidis NOT DETECTED NOT DETECTED Final   Pseudomonas aeruginosa NOT DETECTED NOT DETECTED Final   Stenotrophomonas maltophilia NOT DETECTED NOT DETECTED Final   Candida albicans NOT DETECTED NOT DETECTED Final  Candida auris NOT DETECTED NOT DETECTED Final   Candida glabrata NOT DETECTED NOT DETECTED Final   Candida krusei NOT DETECTED NOT DETECTED Final   Candida parapsilosis NOT DETECTED NOT DETECTED Final   Candida tropicalis NOT DETECTED NOT DETECTED Final   Cryptococcus neoformans/gattii NOT DETECTED NOT DETECTED Final    Comment: Performed at [facility], [place] [address], [place], [place] [address]  Blood culture (routine x 2)     Status: None   Collection Time: 02/05/21  7:17 PM   Specimen: BLOOD  Result Value Ref Range Status   Specimen Description BLOOD SITE NOT SPECIFIED  Final   Special Requests   Final    BOTTLES DRAWN AEROBIC AND ANAEROBIC Blood Culture adequate volume   Culture   Final    NO GROWTH 5 DAYS Performed at [facility], [address] [place], [place] [address]    Report Status 02/10/2021 FINAL  Final  Resp Panel by RT-PCR (Flu A&B, Covid) Nasopharyngeal Swab     Status: None   Collection Time: 02/05/21 10:30 PM   Specimen: Nasopharyngeal Swab; Nasopharyngeal(NP) swabs in vial transport medium  Result Value Ref Range Status   SARS Coronavirus 2 by RT PCR NEGATIVE NEGATIVE Final    Comment: (NOTE) SARS-CoV-2 target nucleic acids are NOT DETECTED.  The SARS-CoV-2 RNA is generally detectable in upper respiratory specimens during the acute phase of infection. The lowest concentration of SARS-CoV-2 viral copies this assay can detect is 138 copies/mL. A negative result does not preclude SARS-Cov-2 infection and should not be used as the  sole basis for treatment or other patient management decisions. A negative result may occur with  improper specimen collection/handling, submission of specimen other than nasopharyngeal swab, presence of viral mutation(s) within the areas targeted by this assay, and inadequate number of viral copies(<138 copies/mL). A negative result must be combined with clinical observations, patient history, and epidemiological information. The expected result is Negative.  Fact Sheet for Patients:  EntrepreneurPulse.com.au  Fact Sheet for Healthcare Providers:  IncredibleEmployment.be  This test is no t yet approved or cleared by the [place] FDA and  has been authorized for detection and/or diagnosis of SARS-CoV-2 by FDA under an Emergency Use Authorization (EUA). This EUA will remain  in effect (meaning this test can be used) for the duration of the COVID-19 declaration under Section 564(b)(1) of the Act, 21 U.S.C.section 360bbb-3(b)(1), unless the authorization is terminated  or revoked sooner.       Influenza A by PCR NEGATIVE NEGATIVE Final   Influenza B by PCR NEGATIVE NEGATIVE Final    Comment: (NOTE) The Xpert Xpress SARS-CoV-2/FLU/RSV plus assay is intended as an aid in the diagnosis of influenza from Nasopharyngeal swab specimens and should not be used as a sole basis for treatment. Nasal washings and aspirates are unacceptable for Xpert Xpress SARS-CoV-2/FLU/RSV testing.  Fact Sheet for Patients: EntrepreneurPulse.com.au  Fact Sheet for Healthcare Providers: IncredibleEmployment.be  This test is not yet approved or cleared by the [place] FDA and has been authorized for detection and/or diagnosis of SARS-CoV-2 by FDA under an Emergency Use Authorization (EUA). This EUA will remain in effect (meaning this test can be used) for the duration of the COVID-19 declaration under Section 564(b)(1) of the  Act, 21 U.S.C. section 360bbb-3(b)(1), unless the authorization is terminated or revoked.  Performed at [facility], [place] [address] [place], [place] [address]   Culture, blood (routine x 2)     Status:  None   Collection Time: 02/06/21  6:50 PM   Specimen: BLOOD RIGHT WRIST  Result Value Ref Range Status   Specimen Description BLOOD RIGHT WRIST  Final   Special Requests AEROBIC BOTTLE ONLY Blood Culture adequate volume  Final   Culture   Final    NO GROWTH 5 DAYS Performed at [facility], [address] [place], [place] [address]    Report Status 02/11/2021 FINAL  Final  Culture, blood (routine x 2)     Status: None   Collection Time: 02/06/21  7:06 PM   Specimen: BLOOD RIGHT WRIST  Result Value Ref Range Status   Specimen Description BLOOD RIGHT WRIST  Final   Special Requests AEROBIC BOTTLE ONLY Blood Culture adequate volume  Final   Culture   Final    NO GROWTH 5 DAYS Performed at [facility], [place] [address], [place], [place] [address]    Report Status 02/11/2021 FINAL  Final  Culture, blood (routine x 2)     Status: None   Collection Time: 02/08/21  9:55 PM   Specimen: BLOOD LEFT HAND  Result Value Ref Range Status   Specimen Description BLOOD LEFT HAND  Final   Special Requests   Final    BOTTLES DRAWN AEROBIC AND ANAEROBIC Blood Culture adequate volume   Culture   Final    NO GROWTH 5 DAYS Performed at [facility], [place] [address] [place], [place] [address]    Report Status 02/13/2021 FINAL  Final  Culture, blood (routine x 2)     Status: Abnormal   Collection Time: 02/08/21 10:04 PM   Specimen: BLOOD RIGHT HAND  Result Value Ref Range Status   Specimen Description BLOOD RIGHT HAND  Final   Special Requests   Final    BOTTLES DRAWN AEROBIC AND ANAEROBIC Blood Culture adequate volume   Culture  Setup Time   Final    AEROBIC BOTTLE ONLY GRAM POSITIVE COCCI CRITICAL VALUE NOTED.  VALUE IS CONSISTENT WITH PREVIOUSLY REPORTED AND CALLED  VALUE. Performed at [facility], [place] [address] [place], [place] [address]    Culture STAPHYLOCOCCUS WARNERI (A)  Final   Report Status 02/12/2021 FINAL  Final   Organism ID, Bacteria STAPHYLOCOCCUS WARNERI  Final      Susceptibility   Staphylococcus warneri - MIC*    CIPROFLOXACIN <=0.5 SENSITIVE Sensitive     ERYTHROMYCIN >=8 RESISTANT Resistant     GENTAMICIN <=0.5 SENSITIVE Sensitive     OXACILLIN >=4 RESISTANT Resistant     TETRACYCLINE <=1 SENSITIVE Sensitive     VANCOMYCIN <=0.5 SENSITIVE Sensitive     TRIMETH/SULFA <=10 SENSITIVE Sensitive     CLINDAMYCIN <=0.25 SENSITIVE Sensitive     RIFAMPIN <=0.5 SENSITIVE Sensitive     Inducible Clindamycin NEGATIVE Sensitive     * STAPHYLOCOCCUS WARNERI  Culture, blood (routine x 2)     Status: None (Preliminary result)   Collection Time: 02/11/21  7:50 AM   Specimen: BLOOD  Result Value Ref Range Status   Specimen Description BLOOD LEFT ANTECUBITAL  Final   Special Requests   Final    BOTTLES DRAWN AEROBIC ONLY Blood Culture adequate volume   Culture   Final    NO GROWTH 4 DAYS Performed at [facility], [address] [place], [place] [address]    Report Status PENDING  Incomplete  Culture, blood (routine x 2)     Status: None (Preliminary result)   Collection  Time: 02/11/21  7:51 AM   Specimen: BLOOD  Result Value Ref Range Status   Specimen Description BLOOD RIGHT ANTECUBITAL  Final   Special Requests   Final    BOTTLES DRAWN AEROBIC ONLY Blood Culture results may not be optimal due to an excessive volume of blood received in culture bottles   Culture   Final    NO GROWTH 4 DAYS Performed at [facility], [place] [address] [place], [place] [address]    Report Status PENDING  Incomplete  Culture, blood (routine x 2)     Status: None (Preliminary result)   Collection Time: 02/14/21  6:39 PM   Specimen: BLOOD  Result Value Ref Range Status   Specimen Description BLOOD LEFT ANTECUBITAL  Final   Special  Requests   Final    BOTTLES DRAWN AEROBIC AND ANAEROBIC Blood Culture adequate volume   Culture   Final    NO GROWTH < 12 HOURS Performed at [facility], [place] [address], [place], [place] [address]    Report Status PENDING  Incomplete  Culture, blood (routine x 2)     Status: None (Preliminary result)   Collection Time: 02/14/21  6:40 PM   Specimen: BLOOD LEFT ARM  Result Value Ref Range Status   Specimen Description BLOOD LEFT ARM  Final   Special Requests   Final    BOTTLES DRAWN AEROBIC AND ANAEROBIC Blood Culture adequate volume   Culture   Final    NO GROWTH < 12 HOURS Performed at [facility], [place] [address], [place],  [address]    Report Status PENDING  Incomplete  Resp Panel by RT-PCR (Flu A&B, Covid) Nasopharyngeal Swab     Status: None   Collection Time: 02/14/21 10:23 PM   Specimen: Nasopharyngeal Swab; Nasopharyngeal(NP) swabs in vial transport medium  Result Value Ref Range Status   SARS Coronavirus 2 by RT PCR NEGATIVE NEGATIVE Final    Comment: (NOTE) SARS-CoV-2 target nucleic acids are NOT DETECTED.  The SARS-CoV-2 RNA is generally detectable in upper respiratory specimens during the acute phase of infection. The lowest concentration of SARS-CoV-2 viral copies this assay can detect is 138 copies/mL. A negative result does not preclude SARS-Cov-2 infection and should not be used as the sole basis for treatment or other patient management decisions. A negative result may occur with  improper specimen collection/handling, submission of specimen other than nasopharyngeal swab, presence of viral mutation(s) within the areas targeted by this assay, and inadequate number of viral copies(<138 copies/mL). A negative result must be combined with clinical observations, patient history, and epidemiological information. The expected result is Negative.  Fact Sheet for Patients:  EntrepreneurPulse.com.au  Fact Sheet for Healthcare  Providers:  IncredibleEmployment.be  This test is no t yet approved or cleared by the [place] FDA and  has been authorized for detection and/or diagnosis of SARS-CoV-2 by FDA under an Emergency Use Authorization (EUA). This EUA will remain  in effect (meaning this test can be used) for the duration of the COVID-19 declaration under Section 564(b)(1) of the Act, 21 U.S.C.section 360bbb-3(b)(1), unless the authorization is terminated  or revoked sooner.       Influenza A by PCR NEGATIVE NEGATIVE Final   Influenza B by PCR NEGATIVE NEGATIVE Final    Comment: (NOTE) The Xpert Xpress SARS-CoV-2/FLU/RSV plus assay is intended as an aid in the diagnosis of influenza from Nasopharyngeal swab specimens and should not be used as a sole basis for  treatment. Nasal washings and aspirates are unacceptable for Xpert Xpress SARS-CoV-2/FLU/RSV testing.  Fact Sheet for Patients: EntrepreneurPulse.com.au  Fact Sheet for Healthcare Providers: IncredibleEmployment.be  This test is not yet approved or cleared by the [place] FDA and has been authorized for detection and/or diagnosis of SARS-CoV-2 by FDA under an Emergency Use Authorization (EUA). This EUA will remain in effect (meaning this test can be used) for the duration of the COVID-19 declaration under Section 564(b)(1) of the Act, 21 U.S.C. section 360bbb-3(b)(1), unless the authorization is terminated or revoked.  Performed at [facility], [place] [address], [place], [place] [address]          Radiology Studies: MR Lumbar Spine W Wo Contrast  Result Date: 02/14/2021 CLINICAL DATA:  Low back pain EXAM: MRI LUMBAR SPINE WITHOUT AND WITH CONTRAST TECHNIQUE: Multiplanar and multiecho pulse sequences of the lumbar spine were obtained without and with intravenous contrast. CONTRAST:  89mL GADAVIST GADOBUTROL 1 MMOL/ML IV SOLN COMPARISON:  Pain FINDINGS: Segmentation:   Standard. Alignment:  Grade 1 anterolisthesis at L4-5 Vertebrae:  No fracture, evidence of discitis, or bone lesion. Conus medullaris and cauda equina: Conus extends to the L1 level. Conus and cauda equina appear normal. Paraspinal and other soft tissues: Negative Disc levels: The disc levels above L4 are unremarkable. L4-5: Small disc bulge with severe facet hypertrophy. Severe spinal canal stenosis and severe left foraminal stenosis. L5-S1: Severe facet arthrosis and mild disc bulge. No spinal canal stenosis. Mild bilateral neural foraminal stenosis. IMPRESSION: 1. Severe spinal canal stenosis and left foraminal stenosis at L4-L5 due to combination of disc bulge and severe facet arthrosis. 2. Severe facet arthrosis at L5-S1 with mild bilateral neural foraminal stenosis. Electronically Signed   By: Ulyses Jarred M.D.   On: 02/14/2021 22:15   DG Chest Port 1 View  Result Date: 02/14/2021 CLINICAL DATA:  Sepsis, back pain EXAM: PORTABLE CHEST 1 VIEW COMPARISON:  02/05/2021 FINDINGS: Prior CABG. Cardiomegaly. Vascular congestion. No overt edema, confluent opacities or effusions. No acute bony abnormality. IMPRESSION: Cardiomegaly, vascular congestion. Electronically Signed   By: Rolm Baptise M.D.   On: 02/14/2021 19:58        Scheduled Meds: . diltiazem  240 mg Oral Daily  . gabapentin  100 mg Oral QHS  . insulin aspart  0-5 Units Subcutaneous QHS  . insulin aspart  0-9 Units Subcutaneous TID WC  . metoprolol succinate  25 mg Oral Daily  . warfarin  15 mg Oral ONCE-1600  . Warfarin - Pharmacist Dosing Inpatient   Does not apply q1600   Continuous Infusions:   LOS: 0 days    Time spent: Additional 30 minutes spent for clinical examination, consultations and other direct patient care.    Barb Merino, MD [facility] Pager [phone]

## 2021-02-15 NOTE — Progress Notes (Signed)
[place] for Warfarin Indication: Afib/MVR  Allergies  Allergen Reactions  . Aricept [Donepezil Hcl]     Intolerance - bad dreams  . Food     Bananas and Fish, cause sinus drainage  . Furosemide     cramps  . Quinine Other (See Comments)    Can't hear  . Spironolactone     constipation  . Tramadol     headache  . Sulfadiazine Itching and Rash    Patient Measurements: Height: 5' 2"  (157.5 cm) Weight: 80.2 kg (176 lb 12.9 oz) IBW/kg (Calculated) : 50.1  Vital Signs: Temp: 98.9 F (37.2 C) (04/21 0123) Temp Source: Oral (04/21 0123) BP: 94/61 (04/21 0123) Pulse Rate: 92 (04/21 0123)  Labs: Recent Labs    02/14/21 1834 02/14/21 1858  HGB 9.6* 9.5*  HCT 28.5* 28.0*  PLT 409*  --   LABPROT 19.3*  --   INR 1.6*  --   CREATININE 1.06* 1.00    Estimated Creatinine Clearance: 43.3 mL/min (by C-G formula based on SCr of 1 mg/dL).   Medical History: Past Medical History:  Diagnosis Date  . AF (atrial fibrillation) ([place])   . ALLERGIC RHINITIS   . Blood transfusion without reported diagnosis   . Cataract, senile   . Chest pain   . CHF (congestive heart failure) ([place])   . Chronic anticoagulation   . Diabetes mellitus   . Diabetes mellitus   . Disorder of oral soft tissue    of mouth  . Diverticulosis   . GERD (gastroesophageal reflux disease)   . Hematoma   . Hypertension   . Leg pain   . Low back pain   . Paresthesia   . Rheumatic fever   . Status post mitral valve replacement    St. Jude valve  . Stroke ([place])    tia  . Sweating   . Vitamin D deficiency    No current facility-administered medications on file prior to encounter.   Current Outpatient Medications on File Prior to Encounter  Medication Sig Dispense Refill  . ACCU-CHEK AVIVA PLUS test strip TEST BLOOD SUGAR TWICE DAILY 200 strip 3  . ACCU-CHEK SOFTCLIX LANCETS lancets 1 each by Other route 2 (two) times daily. Use to check blood sugars twice a day  200 each 3  . acetaminophen (TYLENOL) 500 MG tablet Take 1,000 mg by mouth every 6 (six) hours as needed for mild pain.    . Alcohol Swabs (B-D SINGLE USE SWABS REGULAR) PADS 1 each by Does not apply route 2 (two) times daily. Use to clean finger to check blood sugars twice a day 200 each 3  . amoxicillin (AMOXIL) 500 MG tablet Take four capsules (2,000 mg) one hour prior to all dental visits. (Patient taking differently: Take 2,000 mg by mouth See admin instructions. Take four capsules (2,000 mg) one hour prior to all dental visits.) 8 tablet 11  . Blood Glucose Calibration (ACCU-CHEK AVIVA) SOLN 1 each by In Vitro route as directed. 3 each 1  . Blood Glucose Monitoring Suppl (ACCU-CHEK AVIVA PLUS) w/Device KIT 1 each by Does not apply route 2 (two) times daily. Use as directed 1 kit 0  . Cholecalciferol (VITAMIN D3) 1000 UNIT tablet Take 1,000 Units by mouth daily.    . cyclobenzaprine (FLEXERIL) 10 MG tablet Take 1 tablet (10 mg total) by mouth 3 (three) times daily as needed for muscle spasms. 30 tablet 0  . diltiazem (CARDIZEM CD) 240 MG 24 hr  capsule Take 1 capsule (240 mg total) by mouth daily. 30 capsule 0  . furosemide (LASIX) 20 MG tablet Take 1 tablet (20 mg total) by mouth daily. Take in am 90 tablet 3  . gabapentin (NEURONTIN) 100 MG capsule Take 1 capsule (100 mg total) by mouth at bedtime. 90 capsule 3  . HYDROcodone-acetaminophen (NORCO/VICODIN) 5-325 MG tablet Take 0.5-1 tablets by mouth every 6 (six) hours as needed for severe pain. 20 tablet 0  . Lancet Devices (ACCU-CHEK SOFTCLIX) lancets 1 each by Other route 2 (two) times daily. Use as instructed 1 each 5  . loratadine (CLARITIN) 10 MG tablet Take 1 tablet (10 mg total) by mouth daily as needed for allergies. Take in am 100 tablet 3  . memantine (NAMENDA) 5 MG tablet Take 1 tablet (5 mg total) by mouth 2 (two) times daily. 180 tablet 3  . metFORMIN (GLUCOPHAGE) 500 MG tablet Take 1 tablet (500 mg total) by mouth daily with  breakfast. 90 tablet 3  . metoprolol succinate (TOPROL XL) 25 MG 24 hr tablet Take 1 tablet (25 mg total) by mouth daily. 30 tablet 0  . mirabegron ER (MYRBETRIQ) 25 MG TB24 tablet Take 1 tablet (25 mg total) by mouth daily. 90 tablet 3  . ondansetron (ZOFRAN) 4 MG tablet Take 1 tablet (4 mg total) by mouth every 8 (eight) hours as needed for nausea or vomiting. 20 tablet 0  . OVER THE COUNTER MEDICATION Take 1 tablet by mouth as needed (joint pain).     Marland Kitchen oxyCODONE (OXY IR/ROXICODONE) 5 MG immediate release tablet Take 1 tablet (5 mg total) by mouth every 6 (six) hours as needed for severe pain. 10 tablet 0  . pantoprazole (PROTONIX) 40 MG tablet Take 1 tablet (40 mg total) by mouth daily. Take in am 90 tablet 3  . potassium chloride (KLOR-CON) 10 MEQ tablet Take 1 tablet (10 mEq total) by mouth daily. 90 tablet 3  . vitamin B-12 (CYANOCOBALAMIN) 1000 MCG tablet Take 1,000 mcg by mouth daily.    Marland Kitchen warfarin (COUMADIN) 10 MG tablet Please take 15 mg daily and have IN[facility] checked on Monday 4[facility]25 [facility].       Assessment: 80 y/o female admitted with palpitations, h/o Afib and MVR to continue Coumadin  Goal of Therapy:  INR 2.5-3.5  Monitor platelets by anticoagulation protocol: Yes   Plan:  Coumadin 15 mg today at 4 pm Daily INR  Phillis Knack, PharmD, BCPS

## 2021-02-15 NOTE — H&P (Addendum)
History and Physical    Beth Miller BDZ:[id number] DOB: 11-17-1940 DOA: 02/14/2021  PCP: Cassandria Anger, MD Patient coming from: Home  Chief Complaint: Low back pain  HPI: Beth Miller is a 80 y.o. female with medical history significant of mechanical mitral valve on Coumadin, chronic diastolic heart failure, non-insulin-dependent type 2 diabetes, hypertension, recently admitted to the hospital on 02/05/2021 for A. fib with RVR, abdominal pain, diarrhea, sepsis, and ongoing fevers.  Her abdominal pain and diarrhea resolved.  She continued to have fevers during this hospitalization despite receiving Tylenol.  Infectious disease was consulted and she underwent extensive work-up which was negative for infectious source.  After her fever resolved, she was discharged on 02/12/2021.  She presented to the ED this evening with complaints of low back pain and palpitations.  At triage, febrile with temperature 101.8 F and found to be in A. fib with RVR with rate in the 190s on arrival.  She did not take her Cardizem in the morning.  Labs showing WBC 8.4, hemoglobin 9.6 (stable compared to recent labs), platelet count 409K.  Sodium 135, potassium 3.9, chloride 100, bicarb 24, BUN 14, creatinine 1.0, glucose 130.  No significant elevation of LFTs.  Lactic acid normal x2.  Magnesium within normal range.  INR 1.6.  UA and urine culture pending.  Blood culture x2 pending.  COVID and influenza PCR negative.  Chest x-ray showing cardiomegaly and vascular congestion without overt edema.  MRI of lumbar spine showing severe spinal canal stenosis and severe facet arthrosis but no infectious source such as epidural abscess or osteomyelitis. Patient was given Tylenol, fentanyl, Cardizem bolus and started on continuous infusion.  She was also given 2 L normal saline boluses.  Rate improved with Cardizem infusion.  Patient states since she left the hospital she has not had any more fevers.  No chills.  Her abdominal  pain and diarrhea has also resolved.  She is not having any nausea or vomiting.  No dysuria.  States the main reason she came back to the hospital is because she has continued to have low back pain and they did an MRI in the emergency room and told her she has spinal stenosis. She is not having any cough or shortness of breath.  No chest pain at home but did have a brief episode of right-sided chest pain while they took her for MRI in the emergency room.  Chest pain has now resolved.  She was also having heart palpitations when she came into the emergency room and did not take her home Cardizem in the morning.  Reports compliance with Coumadin but states but her husband has a vegetable garden and is "crazy about greens."  She eats greens every day.  Review of Systems:  All systems reviewed and apart from history of presenting illness, are negative.  Past Medical History:  Diagnosis Date  . AF (atrial fibrillation) ([place])   . ALLERGIC RHINITIS   . Blood transfusion without reported diagnosis   . Cataract, senile   . Chest pain   . CHF (congestive heart failure) ([place])   . Chronic anticoagulation   . Diabetes mellitus   . Diabetes mellitus   . Disorder of oral soft tissue    of mouth  . Diverticulosis   . GERD (gastroesophageal reflux disease)   . Hematoma   . Hypertension   . Leg pain   . Low back pain   . Paresthesia   . Rheumatic fever   .  Status post mitral valve replacement    St. Jude valve  . Stroke ([place])    tia  . Sweating   . Vitamin D deficiency     Past Surgical History:  Procedure Laterality Date  . ABDOMINAL HYSTERECTOMY    . CARDIAC VALVE REPLACEMENT  1995   Mitral valve prosthesis; st jude  . CHOLECYSTECTOMY       reports that she quit smoking about 18 years ago. Her smoking use included cigarettes. She has a 1.20 pack-year smoking history. She has never used smokeless tobacco. She reports that she does not drink alcohol and does not use drugs.  Allergies   Allergen Reactions  . Aricept [Donepezil Hcl]     Intolerance - bad dreams  . Food     Bananas and Fish, cause sinus drainage  . Furosemide     cramps  . Quinine Other (See Comments)    Can't hear  . Spironolactone     constipation  . Tramadol     headache  . Sulfadiazine Itching and Rash    Family History  Problem Relation Age of Onset  . Diabetes Mother   . Seizures Mother   . Other Mother        brain tumor  . Heart disease Brother   . Diabetes Brother   . Heart disease Brother   . Pancreatic cancer Brother   . Colon cancer Neg Hx     Prior to Admission medications   Medication Sig Start Date End Date Taking? Authorizing Provider  ACCU-CHEK AVIVA PLUS test strip TEST BLOOD SUGAR TWICE DAILY 07/17/20   Plotnikov, Evie Lacks, MD  ACCU-CHEK SOFTCLIX LANCETS lancets 1 each by Other route 2 (two) times daily. Use to check blood sugars twice a day 06/18/17   Plotnikov, Evie Lacks, MD  acetaminophen (TYLENOL) 500 MG tablet Take 1,000 mg by mouth every 6 (six) hours as needed for mild pain.    [provider]  Alcohol Swabs (B-D SINGLE USE SWABS REGULAR) PADS 1 each by Does not apply route 2 (two) times daily. Use to clean finger to check blood sugars twice a day 06/18/17   Plotnikov, Evie Lacks, MD  amoxicillin (AMOXIL) 500 MG tablet Take four capsules (2,000 mg) one hour prior to all dental visits. Patient taking differently: Take 2,000 mg by mouth See admin instructions. Take four capsules (2,000 mg) one hour prior to all dental visits. 11/25/19   Josue Hector, MD  Blood Glucose Calibration (ACCU-CHEK AVIVA) SOLN 1 each by In Vitro route as directed. 06/18/17   Plotnikov, Evie Lacks, MD  Blood Glucose Monitoring Suppl (ACCU-CHEK AVIVA PLUS) w/Device KIT 1 each by Does not apply route 2 (two) times daily. Use as directed 05/26/19   Plotnikov, Evie Lacks, MD  Cholecalciferol (VITAMIN D3) 1000 UNIT tablet Take 1,000 Units by mouth daily.    [provider]   cyclobenzaprine (FLEXERIL) 10 MG tablet Take 1 tablet (10 mg total) by mouth 3 (three) times daily as needed for muscle spasms. 02/12/21   Debbe Odea, MD  diltiazem (CARDIZEM CD) 240 MG 24 hr capsule Take 1 capsule (240 mg total) by mouth daily. 02/12/21   Debbe Odea, MD  furosemide (LASIX) 20 MG tablet Take 1 tablet (20 mg total) by mouth daily. Take in am 01/30/21   Plotnikov, Evie Lacks, MD  gabapentin (NEURONTIN) 100 MG capsule Take 1 capsule (100 mg total) by mouth at bedtime. 01/30/21   Plotnikov, Evie Lacks, MD  HYDROcodone-acetaminophen (NORCO/VICODIN) [phone]  MG tablet Take 0.5-1 tablets by mouth every 6 (six) hours as needed for severe pain. 10/09/20 10/09/21  Plotnikov, Evie Lacks, MD  Lancet Devices [facility]) lancets 1 each by Other route 2 (two) times daily. Use as instructed 12/04/15   Plotnikov, Evie Lacks, MD  loratadine (CLARITIN) 10 MG tablet Take 1 tablet (10 mg total) by mouth daily as needed for allergies. Take in am 04/26/19   Plotnikov, Evie Lacks, MD  memantine (NAMENDA) 5 MG tablet Take 1 tablet (5 mg total) by mouth 2 (two) times daily. 01/30/21   Plotnikov, Evie Lacks, MD  metFORMIN (GLUCOPHAGE) 500 MG tablet Take 1 tablet (500 mg total) by mouth daily with breakfast. 01/30/21   Plotnikov, Evie Lacks, MD  metoprolol succinate (TOPROL XL) 25 MG 24 hr tablet Take 1 tablet (25 mg total) by mouth daily. 02/12/21 02/12/22  Debbe Odea, MD  mirabegron ER (MYRBETRIQ) 25 MG TB24 tablet Take 1 tablet (25 mg total) by mouth daily. 01/30/21   Plotnikov, Evie Lacks, MD  ondansetron (ZOFRAN) 4 MG tablet Take 1 tablet (4 mg total) by mouth every 8 (eight) hours as needed for nausea or vomiting. 10/24/16   Nche, Charlene Brooke, NP  OVER THE COUNTER MEDICATION Take 1 tablet by mouth as needed (joint pain).     [provider]  oxyCODONE (OXY IR/ROXICODONE) 5 MG immediate release tablet Take 1 tablet (5 mg total) by mouth every 6 (six) hours as needed for severe pain. 02/12/21   Debbe Odea, MD  pantoprazole (PROTONIX) 40 MG tablet Take 1 tablet (40 mg total) by mouth daily. Take in am 01/30/21   Plotnikov, Evie Lacks, MD  potassium chloride (KLOR-CON) 10 MEQ tablet Take 1 tablet (10 mEq total) by mouth daily. 01/30/21   Plotnikov, Evie Lacks, MD  vitamin B-12 (CYANOCOBALAMIN) 1000 MCG tablet Take 1,000 mcg by mouth daily.    [provider]  warfarin (COUMADIN) 10 MG tablet Please take 15 mg daily and have INR checked on Monday 4/25 at the [facility]. 02/12/21   Debbe Odea, MD    Physical Exam: Vitals:   02/15/21 0038 02/15/21 0040 02/15/21 0041 02/15/21 0123  BP:  102/66  94/61  Pulse:  92 92 92  Resp:  (!) 25 (!) 23 20  Temp: 98.2 F (36.8 C)   98.9 F (37.2 C)  TempSrc: Oral   Oral  SpO2:  95% 95% 94%  Weight:    80.2 kg  Height:    5' 2"  (1.575 m)    Physical Exam Constitutional:      General: She is not in acute distress. HENT:     Head: Normocephalic and atraumatic.  Eyes:     Extraocular Movements: Extraocular movements intact.     Conjunctiva/sclera: Conjunctivae normal.  Cardiovascular:     Rate and Rhythm: Normal rate and regular rhythm.     Pulses: Normal pulses.  Pulmonary:     Effort: Pulmonary effort is normal. No respiratory distress.     Breath sounds: Normal breath sounds. No wheezing or rales.  Abdominal:     General: Bowel sounds are normal. There is no distension.     Palpations: Abdomen is soft.     Tenderness: There is no abdominal tenderness.  Musculoskeletal:        General: No swelling or tenderness.     Cervical back: Normal range of motion and neck supple.  Skin:    General: Skin is warm.  Neurological:     General:  No focal deficit present.     Mental Status: She is alert and oriented to person, place, and time.     Sensory: No sensory deficit.     Motor: No weakness.     Comments: Strength 5 out of 5 in bilateral lower extremities.  No sensory deficit.     Labs on Admission: I have personally reviewed  following labs and imaging studies  CBC: Recent Labs  Lab 02/08/21 0209 02/14/21 1834 02/14/21 1858  WBC 7.3 8.4  --   NEUTROABS  --  5.6  --   HGB 9.6* 9.6* 9.5*  HCT 28.3* 28.5* 28.0*  MCV 78.8* 79.4*  --   PLT 240 409*  --    Basic Metabolic Panel: Recent Labs  Lab 02/08/21 0209 02/14/21 1834 02/14/21 1858  NA 136 135 136  K 4.0 3.9 3.9  CL 102 100 100  CO2 28 24  --   GLUCOSE 155* 130* 134*  BUN 13 14 16   CREATININE 0.96 1.06* 1.00  CALCIUM 9.3 9.6  --   MG  --  1.8  --    GFR: Estimated Creatinine Clearance: 43.3 mL/min (by C-G formula based on SCr of 1 mg/dL). Liver Function Tests: Recent Labs  Lab 02/14/21 1834  AST 26  ALT 32  ALKPHOS 98  BILITOT 1.3*  PROT 7.4  ALBUMIN 3.5   No results for input(s): LIPASE, AMYLASE in the last 168 hours. No results for input(s): AMMONIA in the last 168 hours. Coagulation Profile: Recent Labs  Lab 02/09/21 0250 02/10/21 0120 02/11/21 0139 02/12/21 0146 02/14/21 1834  INR 2.3* 2.3* 2.5* 2.5* 1.6*   Cardiac Enzymes: Recent Labs  Lab 02/10/21 0120  CKTOTAL 37*   BNP (last 3 results) No results for input(s): PROBNP in the last 8760 hours. HbA1C: No results for input(s): HGBA1C in the last 72 hours. CBG: Recent Labs  Lab 02/11/21 1131 02/11/21 1555 02/11/21 2121 02/12/21 0615 02/15/21 0105  GLUCAP 180* 100* 129* 113* 121*   Lipid Profile: No results for input(s): CHOL, HDL, LDLCALC, TRIG, CHOLHDL, LDLDIRECT in the last 72 hours. Thyroid Function Tests: No results for input(s): TSH, T4TOTAL, FREET4, T3FREE, THYROIDAB in the last 72 hours. Anemia Panel: No results for input(s): VITAMINB12, FOLATE, FERRITIN, TIBC, IRON, RETICCTPCT in the last 72 hours. Urine analysis:    Component Value Date/Time   COLORURINE YELLOW 02/09/2021 [place] 02/09/2021 0917   LABSPEC 1.008 02/09/2021 0917   PHURINE 6.0 02/09/2021 0917   GLUCOSEU NEGATIVE 02/09/2021 0917   GLUCOSEU NEGATIVE 04/27/2019  1041   HGBUR LARGE (A) 02/09/2021 0917   BILIRUBINUR NEGATIVE 02/09/2021 0917   KETONESUR NEGATIVE 02/09/2021 0917   PROTEINUR NEGATIVE 02/09/2021 0917   UROBILINOGEN 0.2 04/27/2019 1041   NITRITE NEGATIVE 02/09/2021 0917   LEUKOCYTESUR NEGATIVE 02/09/2021 0917    Radiological Exams on Admission: MR Lumbar Spine W Wo Contrast  Result Date: 02/14/2021 CLINICAL DATA:  Low back pain EXAM: MRI LUMBAR SPINE WITHOUT AND WITH CONTRAST TECHNIQUE: Multiplanar and multiecho pulse sequences of the lumbar spine were obtained without and with intravenous contrast. CONTRAST:  77m GADAVIST GADOBUTROL 1 MMOL/ML IV SOLN COMPARISON:  Pain FINDINGS: Segmentation:  Standard. Alignment:  Grade 1 anterolisthesis at L4-5 Vertebrae:  No fracture, evidence of discitis, or bone lesion. Conus medullaris and cauda equina: Conus extends to the L1 level. Conus and cauda equina appear normal. Paraspinal and other soft tissues: Negative Disc levels: The disc levels above L4 are unremarkable. L4-5: Small disc bulge  with severe facet hypertrophy. Severe spinal canal stenosis and severe left foraminal stenosis. L5-S1: Severe facet arthrosis and mild disc bulge. No spinal canal stenosis. Mild bilateral neural foraminal stenosis. IMPRESSION: 1. Severe spinal canal stenosis and left foraminal stenosis at L4-L5 due to combination of disc bulge and severe facet arthrosis. 2. Severe facet arthrosis at L5-S1 with mild bilateral neural foraminal stenosis. Electronically Signed   By: Ulyses Jarred M.D.   On: 02/14/2021 22:15   DG Chest Port 1 View  Result Date: 02/14/2021 CLINICAL DATA:  Sepsis, back pain EXAM: PORTABLE CHEST 1 VIEW COMPARISON:  02/05/2021 FINDINGS: Prior CABG. Cardiomegaly. Vascular congestion. No overt edema, confluent opacities or effusions. No acute bony abnormality. IMPRESSION: Cardiomegaly, vascular congestion. Electronically Signed   By: Rolm Baptise M.D.   On: 02/14/2021 19:58    EKG: Independently reviewed.   Initial EKG showing A. fib with RVR versus SVT.  Rhythm appeared to be A. fib on repeat EKG.  Assessment/Plan Principal Problem:   Fever Active Problems:   DM2 (diabetes mellitus, type 2) (HCC)   Essential hypertension   MITRAL VALVE REPLACEMENT, HX OF   Atrial fibrillation with rapid ventricular response ([facility])   Fever/ SIRS During her recent hospital admission, she continued to be febrile despite receiving Tylenol.  Infectious disease was consulted and she underwent extensive work-up  including UA, CT chest/abdomen/pelvis, TTE which did not show source of infection.  Given complaints of left gluteal pain, she underwent left hip MRI which also did not show source of infection.  Her procalcitonin was <0.10.  C. difficile testing was negative.  HIV nonreactive, hepatitis panel negative.  Initial blood cultures grew staph hominis in 1 of 2 sites and repeat grew staph Warneri in 1 of 2 sites which ID felt were contaminants.  She now presents with recurrent fevers.  Significantly tachycardic/in A. fib on arrival.  However, does not have leukocytosis or lactic acidosis on labs.  COVID and influenza PCR negative.  Chest x-ray not suggestive of pneumonia.  No signs of meningismus.  Given complaints of low back pain, lumbar MRI was done in the ED which did not show epidural abscess or osteomyelitis. -UA and urine culture pending.  Blood cultures have been drawn in the ED. Will hold off starting antibiotics at this time given negative work-up so far.  Check procalcitonin level, if elevated then start antibiotics.  Fever has now resolved after Tylenol.  She was given 2 L fluid boluses in the ED. Consult ID in the morning.  A. fib with RVR Possibly due to medication noncompliance. Patient did not take her morning dose of Cardizem.  Given fevers, ?underlying infectious source as precipitating factor.  Potassium and magnesium within normal range.  PE less likely to be a precipitating factor given no hypoxia.  TSH  was normal on 01/30/2021. Rate was in the 190s on arrival to the ED and has now improved with IV Cardizem. -Continue Cardizem infusion.  When she converts to sinus rhythm, transition to p.o. Cardizem.  Continue Coumadin for anticoagulation.  Mechanical mitral valve on Coumadin with subtherapeutic INR During her prior hospitalization, patient had told her physician that she was eating greens from her husband's garden and wished to continue doing so.  INR subtherapeutic at 1.6.  Again today she tells me that she eats greens every day. -Coumadin dosing per pharmacy, continue to monitor PT/INR.  Continue to educate about diet in the setting of Coumadin use.  Chronic diastolic heart failure Chest x-ray showing cardiomegaly and  vascular congestion without overt edema.  She was given 2 L fluid boluses in the ED given fever/SIRS. No hypoxia or signs of respiratory distress at this time. -Avoid additional IV fluid and monitor volume status closely.  Holding diuretic at this time given concern for fever/SIRS.   Well-controlled non-insulin-dependent type 2 diabetes A1c 6.8 on 01/30/2021. -Sliding scale insulin sensitive ACHS.  Continue gabapentin for neuropathy.  Hypertension Stable. -On Cardizem infusion.  Hold p.o. meds at this time.  Spinal stenosis MRI of lumbar spine showing severe spinal canal stenosis and left foraminal stenosis at L4-L5 due to combination of disc bulge and severe facet arthrosis. Severe facet arthrosis at L5-S1 with mild bilateral neural foraminal stenosis.  No lower extremity weakness or red flag symptoms. -Will need neurosurgery follow-up.  Addendum 2:36 AM: Chest pain Appears atypical.  No chest pain at home and had a brief episode of right-sided chest pain while in MRI machine.  Could possibly be related to rapid A. fib, rate has now improved and she is no longer having any chest pain. -Cardiac monitoring, check high-sensitivity troponin level x2  DVT prophylaxis:  Coumadin Code Status: Patient wishes to be DNR. Family Communication: No family available at this time. Disposition Plan: Status is: Observation  The patient remains OBS appropriate and will d/c before 2 midnights.  Dispo: The patient is from: Home              Anticipated d/c is to: Home              Patient currently is not medically stable to d/c.   Difficult to place patient No  Level of care: Level of care: Progressive   The medical decision making on this patient was of high complexity and the patient is at high risk for clinical deterioration, therefore this is a level 3 visit.  Shela Leff MD [facility]  If 7PM-7AM, please contact night-coverage [email or web address]  02/15/2021, 1:45 AM

## 2021-02-15 NOTE — Consult Note (Signed)
Date of Admission:  02/14/2021          Reason for Consult: Fever of unknown origin    Referring Provider: Dr Sloan Leiter   Assessment:  1. FUO 2. Atrial fibrillation with rapid ventricular response 3. Mitral valve replacement 4. TAVR 5. DM 6. Low back pain now found to have inferior spinal canal stenosis left foraminal stenosis L4-L5 and severe facet arthrosis at L5-S1 7. History of having had coagulase-negative staphylococcal species in 1 out of 2 blood cultures on 2 occasions 8. History of heterogeneous enhancing left thyroid lobe on CT of the neck in 2020 9. ANA + with homogenous pattern at 1:80  Plan:  1. Would follow-up blood cultures 2. DC Tylenol and observe for fevers 3. Try to obtain PET scan (keeping good glycemic control so the test can be helpful) 4. Ask cardiology for transesophageal echocardiogram 5. Ultrasound thyroid 6. Duplex ultrasounds to look for deep venous thromboses in the upper and lower extremities 7. HIV RNA QF gold and will send some additional auto immune FUO labs  Principal Problem:   Fever Active Problems:   DM2 (diabetes mellitus, type 2) (HCC)   Essential hypertension   MITRAL VALVE REPLACEMENT, HX OF   Atrial fibrillation with rapid ventricular response (HCC)   Scheduled Meds: . diltiazem  240 mg Oral Daily  . gabapentin  100 mg Oral QHS  . insulin aspart  0-5 Units Subcutaneous QHS  . insulin aspart  0-9 Units Subcutaneous TID WC  . metoprolol succinate  25 mg Oral Daily  . warfarin  15 mg Oral ONCE-1600  . Warfarin - Pharmacist Dosing Inpatient   Does not apply q1600   Continuous Infusions: PRN Meds:.  HPI: Beth Miller is a 80 y.o. female with past medical history significant right mitral valve replacement TAVR atrial fibrillation with rapid ventricle response diabetes mellitus diastolic heart failure who was admitted earlier this month with complaint plain in her butt and lower back.  Of note when she was admitted she had  had prior to admission she had had watery loose stools 4 times a day.  She had had a prior episode of diarrhea a month prior.  On admission she did have left buttock pain that was aggravated by activity improved with rest.  She was febrile to 101.4.  CT of the abdomen pelvis did not show any intra-abdominal pathology.  Loose bowel movements had resolved since then.  Blood cultures were taken on admission and 1 out of 2 grew Staph hominis April 11 that we consider to be a contaminant.  I saw her for fever of unknown origin work-up but initially her fevers were not that high but once coming off Tylenol she spiked high her temperature.  Work-up that was done included CT chest abdomen and repeat CT abdomen pelvis.  This showed a little bit of thickening of the airways with possible bronchitis and some scattered nodules in the left upper lobe that were nonspecific and some areas on her anterior abdominal wall consistent with where she was getting subcutaneous heparin.  As of her hip pain we obtained an MRI of the hip that was negative for any evidence of osteomyelitis.  Repeat blood cultures from 14 April grew a Staphylococcus Warner I) another coagulase-negative staphylococcal species in 1 of 2 blood culture sites again consistent with a contaminant.  She underwent transthoracic echocardiogram showed an EF of 55% severely dilated left atrium and right atrium well-positioned mitral valve prosthesis with trivial mitral  valve regurgitation.  Tricuspid valve was eccentric and had moderate to severe regurgitation aortic valve is tricuspid with mild to moderate aortic valve sclerosis calcification.  Serologic work-up included negative HIV testing negative hepatitis serologies EBV and CMV serologies consistent with past infection.  Syphilis was negative her respiratory panel of course when she came in was negative for flu a and B and COVID-19.  I do not see a QuantiFERON gold so we will order 1 of those.  LDL H  was borderline elevated ferritin was normal sed rate was 54 and CRP 7  Her ANA was for what is worth positive with a homogeneous pattern and 1-80.  SPEP was negative cryoglobulins were not performed   I am skeptical that she has a bacterial cause of her FUO.  It is prudent to get blood cultures and it would also be wise to get a transesophageal echocardiogram to sure make sure there is no evidence of culture-negative endocarditis  A PET scan can also be a very helpful tool in the work-up of FUO particular in the patient right now whose imaging of all the areas that she has had local symptoms has been unremarkable.  Have some abnormalities in her thyroid on CT of the neck back in November 2020 so getting an ultrasound of the thyroid would be reasonable.  Ultimately she may benefit from a rheumatology consult given that the only things that we are getting positive and her work-up so far include nonspecific inflammatory markers and a positive ANA.   Review of Systems: Review of Systems  Constitutional: Positive for fever. Negative for chills, malaise/fatigue and weight loss.  HENT: Negative for congestion and sore throat.   Eyes: Negative for blurred vision and photophobia.  Respiratory: Negative for cough, shortness of breath and wheezing.   Cardiovascular: Positive for palpitations. Negative for chest pain and leg swelling.  Gastrointestinal: Negative for abdominal pain, blood in stool, constipation, diarrhea, heartburn, melena, nausea and vomiting.  Genitourinary: Negative for dysuria, flank pain and hematuria.  Musculoskeletal: Positive for back pain. Negative for falls, joint pain and myalgias.  Skin: Negative for itching and rash.  Neurological: Negative for dizziness, focal weakness, loss of consciousness, weakness and headaches.  Endo/Heme/Allergies: Does not bruise/bleed easily.  Psychiatric/Behavioral: Negative for depression and suicidal ideas. The patient does not have  insomnia.     Past Medical History:  Diagnosis Date  . AF (atrial fibrillation) ([place])   . ALLERGIC RHINITIS   . Blood transfusion without reported diagnosis   . Cataract, senile   . Chest pain   . CHF (congestive heart failure) ([place])   . Chronic anticoagulation   . Diabetes mellitus   . Diabetes mellitus   . Disorder of oral soft tissue    of mouth  . Diverticulosis   . GERD (gastroesophageal reflux disease)   . Hematoma   . Hypertension   . Leg pain   . Low back pain   . Paresthesia   . Rheumatic fever   . Status post mitral valve replacement    St. Jude valve  . Stroke ([place])    tia  . Sweating   . Vitamin D deficiency     Social History   Tobacco Use  . Smoking status: Former Smoker    Packs/day: 0.02    Years: 60.00    Pack years: 1.20    Types: Cigarettes    Quit date: 10/28/2002    Years since quitting: 18.3  . Smokeless tobacco: Never Used  .  Tobacco comment: quit x 13 years   Vaping Use  . Vaping Use: Never used  Substance Use Topics  . Alcohol use: No    Alcohol/week: 0.0 standard drinks  . Drug use: No    Family History  Problem Relation Age of Onset  . Diabetes Mother   . Seizures Mother   . Other Mother        brain tumor  . Heart disease Brother   . Diabetes Brother   . Heart disease Brother   . Pancreatic cancer Brother   . Colon cancer Neg Hx    Allergies  Allergen Reactions  . Aricept [Donepezil Hcl]     Intolerance - bad dreams  . Food     Bananas and Fish, cause sinus drainage  . Furosemide     cramps  . Quinine Other (See Comments)    Can't hear  . Spironolactone     constipation  . Tramadol     headache  . Sulfadiazine Itching and Rash    OBJECTIVE: Blood pressure 110/61, pulse 93, temperature 98.3 F (36.8 C), temperature source Oral, resp. rate 17, height 5\' 2"  (1.575 m), weight 80.2 kg, SpO2 95 %.  Physical Exam Constitutional:      General: She is not in acute distress.    Appearance: Normal appearance. She  is well-developed. She is not ill-appearing or diaphoretic.  HENT:     Head: Normocephalic and atraumatic.     Right Ear: Hearing and external ear normal.     Left Ear: Hearing and external ear normal.     Nose: No nasal deformity or rhinorrhea.  Eyes:     General: No scleral icterus.    Extraocular Movements: Extraocular movements intact.     Conjunctiva/sclera: Conjunctivae normal.     Right eye: Right conjunctiva is not injected.     Left eye: Left conjunctiva is not injected.  Neck:     Vascular: No JVD.  Cardiovascular:     Rate and Rhythm: Tachycardia present. Rhythm irregular.     Heart sounds: S1 normal and S2 normal. Murmur heard.  No friction rub.  Abdominal:     General: Bowel sounds are normal. There is no distension.     Palpations: Abdomen is soft. There is no mass.     Tenderness: There is no abdominal tenderness.     Hernia: No hernia is present.  Musculoskeletal:        General: Normal range of motion.     Right shoulder: Normal.     Left shoulder: Normal.     Cervical back: Normal range of motion and neck supple.     Right hip: Normal.     Left hip: Normal.     Right knee: Normal.     Left knee: Normal.  Lymphadenopathy:     Head:     Right side of head: No submandibular, preauricular or posterior auricular adenopathy.     Left side of head: No submandibular, preauricular or posterior auricular adenopathy.     Cervical: No cervical adenopathy.     Right cervical: No superficial or deep cervical adenopathy.    Left cervical: No superficial or deep cervical adenopathy.  Skin:    General: Skin is warm and dry.     Coloration: Skin is not pale.     Findings: No abrasion, bruising, ecchymosis, erythema, lesion or rash.     Nails: There is no clubbing.  Neurological:     General: No focal deficit  present.     Mental Status: She is alert and oriented to person, place, and time.     Sensory: No sensory deficit.     Coordination: Coordination normal.      Gait: Gait normal.  Psychiatric:        Attention and Perception: She is attentive.        Mood and Affect: Mood normal.        Speech: Speech normal.        Behavior: Behavior normal. Behavior is cooperative.        Thought Content: Thought content normal.        Judgment: Judgment normal.     Lab Results Lab Results  Component Value Date   WBC 8.4 02/14/2021   HGB 9.5 (L) 02/14/2021   HCT 28.0 (L) 02/14/2021   MCV 79.4 (L) 02/14/2021   PLT 409 (H) 02/14/2021    Lab Results  Component Value Date   CREATININE 1.00 02/14/2021   BUN 16 02/14/2021   NA 136 02/14/2021   K 3.9 02/14/2021   CL 100 02/14/2021   CO2 24 02/14/2021    Lab Results  Component Value Date   ALT 32 02/14/2021   AST 26 02/14/2021   ALKPHOS 98 02/14/2021   BILITOT 1.3 (H) 02/14/2021     Microbiology: Recent Results (from the past 240 hour(s))  Blood culture (routine x 2)     Status: Abnormal   Collection Time: 02/05/21  6:15 PM   Specimen: BLOOD  Result Value Ref Range Status   Specimen Description BLOOD SITE NOT SPECIFIED  Final   Special Requests   Final    BOTTLES DRAWN AEROBIC AND ANAEROBIC Blood Culture results may not be optimal due to an inadequate volume of blood received in culture bottles   Culture  Setup Time   Final    GRAM POSITIVE COCCI IN BOTH AEROBIC AND ANAEROBIC BOTTLES CRITICAL RESULT CALLED TO, READ BACK BY AND VERIFIED WITH: PHARMD T. DANG [id number] FCP    Culture (A)  Final    STAPHYLOCOCCUS HOMINIS THE SIGNIFICANCE OF ISOLATING THIS ORGANISM FROM A SINGLE SET OF BLOOD CULTURES WHEN MULTIPLE SETS ARE DRAWN IS UNCERTAIN. PLEASE NOTIFY THE MICROBIOLOGY DEPARTMENT WITHIN ONE WEEK IF SPECIATION AND SENSITIVITIES ARE REQUIRED. Performed at [facility], [place] [address], [place], [place] [address]    Report Status 02/08/2021 FINAL  Final  Blood Culture ID Panel (Reflexed)     Status: Abnormal   Collection Time: 02/05/21  6:15 PM  Result Value Ref Range Status    Enterococcus faecalis NOT DETECTED NOT DETECTED Final   Enterococcus Faecium NOT DETECTED NOT DETECTED Final   Listeria monocytogenes NOT DETECTED NOT DETECTED Final   Staphylococcus species DETECTED (A) NOT DETECTED Final    Comment: CRITICAL RESULT CALLED TO, READ BACK BY AND VERIFIED WITH: PHARMD T. DANG [id number] FCP    Staphylococcus aureus (BCID) NOT DETECTED NOT DETECTED Final   Staphylococcus epidermidis NOT DETECTED NOT DETECTED Final   Staphylococcus lugdunensis NOT DETECTED NOT DETECTED Final   Streptococcus species NOT DETECTED NOT DETECTED Final   Streptococcus agalactiae NOT DETECTED NOT DETECTED Final   Streptococcus pneumoniae NOT DETECTED NOT DETECTED Final   Streptococcus pyogenes NOT DETECTED NOT DETECTED Final   A.calcoaceticus-baumannii NOT DETECTED NOT DETECTED Final   Bacteroides fragilis NOT DETECTED NOT DETECTED Final   Enterobacterales NOT DETECTED NOT DETECTED Final   Enterobacter cloacae complex NOT DETECTED NOT DETECTED Final   Escherichia coli NOT  DETECTED NOT DETECTED Final   Klebsiella aerogenes NOT DETECTED NOT DETECTED Final   Klebsiella oxytoca NOT DETECTED NOT DETECTED Final   Klebsiella pneumoniae NOT DETECTED NOT DETECTED Final   Proteus species NOT DETECTED NOT DETECTED Final   Salmonella species NOT DETECTED NOT DETECTED Final   Serratia marcescens NOT DETECTED NOT DETECTED Final   Haemophilus influenzae NOT DETECTED NOT DETECTED Final   Neisseria meningitidis NOT DETECTED NOT DETECTED Final   Pseudomonas aeruginosa NOT DETECTED NOT DETECTED Final   Stenotrophomonas maltophilia NOT DETECTED NOT DETECTED Final   Candida albicans NOT DETECTED NOT DETECTED Final   Candida auris NOT DETECTED NOT DETECTED Final   Candida glabrata NOT DETECTED NOT DETECTED Final   Candida krusei NOT DETECTED NOT DETECTED Final   Candida parapsilosis NOT DETECTED NOT DETECTED Final   Candida tropicalis NOT DETECTED NOT DETECTED Final   Cryptococcus  neoformans/gattii NOT DETECTED NOT DETECTED Final    Comment: Performed at [facility], [place] [address], [place], [place] [address]  Blood culture (routine x 2)     Status: None   Collection Time: 02/05/21  7:17 PM   Specimen: BLOOD  Result Value Ref Range Status   Specimen Description BLOOD SITE NOT SPECIFIED  Final   Special Requests   Final    BOTTLES DRAWN AEROBIC AND ANAEROBIC Blood Culture adequate volume   Culture   Final    NO GROWTH 5 DAYS Performed at [facility], [address], [place], [place] [address]    Report Status 02/10/2021 FINAL  Final  Resp Panel by RT-PCR (Flu A&B, Covid) Nasopharyngeal Swab     Status: None   Collection Time: 02/05/21 10:30 PM   Specimen: Nasopharyngeal Swab; Nasopharyngeal(NP) swabs in vial transport medium  Result Value Ref Range Status   SARS Coronavirus 2 by RT PCR NEGATIVE NEGATIVE Final    Comment: (NOTE) SARS-CoV-2 target nucleic acids are NOT DETECTED.  The SARS-CoV-2 RNA is generally detectable in upper respiratory specimens during the acute phase of infection. The lowest concentration of SARS-CoV-2 viral copies this assay can detect is 138 copies/mL. A negative result does not preclude SARS-Cov-2 infection and should not be used as the sole basis for treatment or other patient management decisions. A negative result may occur with  improper specimen collection/handling, submission of specimen other than nasopharyngeal swab, presence of viral mutation(s) within the areas targeted by this assay, and inadequate number of viral copies(<138 copies/mL). A negative result must be combined with clinical observations, patient history, and epidemiological information. The expected result is Negative.  Fact Sheet for Patients:  EntrepreneurPulse.com.au  Fact Sheet for Healthcare Providers:  IncredibleEmployment.be  This test is no t yet approved or cleared by the [place] FDA and   has been authorized for detection and/or diagnosis of SARS-CoV-2 by FDA under an Emergency Use Authorization (EUA). This EUA will remain  in effect (meaning this test can be used) for the duration of the COVID-19 declaration under Section 564(b)(1) of the Act, 21 U.S.C.section 360bbb-3(b)(1), unless the authorization is terminated  or revoked sooner.       Influenza A by PCR NEGATIVE NEGATIVE Final   Influenza B by PCR NEGATIVE NEGATIVE Final    Comment: (NOTE) The Xpert Xpress SARS-CoV-2/FLU/RSV plus assay is intended as an aid in the diagnosis of influenza from Nasopharyngeal swab specimens and should not be used as a sole basis for treatment. Nasal washings and aspirates are unacceptable for Xpert Xpress SARS-CoV-2/FLU/RSV testing.  Fact  Sheet for Patients: EntrepreneurPulse.com.au  Fact Sheet for Healthcare Providers: IncredibleEmployment.be  This test is not yet approved or cleared by the [place] FDA and has been authorized for detection and/or diagnosis of SARS-CoV-2 by FDA under an Emergency Use Authorization (EUA). This EUA will remain in effect (meaning this test can be used) for the duration of the COVID-19 declaration under Section 564(b)(1) of the Act, 21 U.S.C. section 360bbb-3(b)(1), unless the authorization is terminated or revoked.  Performed at [facility], [place] [address], [place], [place] [address]   Culture, blood (routine x 2)     Status: None   Collection Time: 02/06/21  6:50 PM   Specimen: BLOOD RIGHT WRIST  Result Value Ref Range Status   Specimen Description BLOOD RIGHT WRIST  Final   Special Requests AEROBIC BOTTLE ONLY Blood Culture adequate volume  Final   Culture   Final    NO GROWTH 5 DAYS Performed at [facility], [place] [address] [place], [place] [address]    Report Status 02/11/2021 FINAL  Final  Culture, blood (routine x 2)     Status: None   Collection Time: 02/06/21  7:06 PM    Specimen: BLOOD RIGHT WRIST  Result Value Ref Range Status   Specimen Description BLOOD RIGHT WRIST  Final   Special Requests AEROBIC BOTTLE ONLY Blood Culture adequate volume  Final   Culture   Final    NO GROWTH 5 DAYS Performed at [facility], [place] [address] [place], [place] [address]    Report Status 02/11/2021 FINAL  Final  Culture, blood (routine x 2)     Status: None   Collection Time: 02/08/21  9:55 PM   Specimen: BLOOD LEFT HAND  Result Value Ref Range Status   Specimen Description BLOOD LEFT HAND  Final   Special Requests   Final    BOTTLES DRAWN AEROBIC AND ANAEROBIC Blood Culture adequate volume   Culture   Final    NO GROWTH 5 DAYS Performed at [facility], [place] [address] [place], [place] [address]    Report Status 02/13/2021 FINAL  Final  Culture, blood (routine x 2)     Status: Abnormal   Collection Time: 02/08/21 10:04 PM   Specimen: BLOOD RIGHT HAND  Result Value Ref Range Status   Specimen Description BLOOD RIGHT HAND  Final   Special Requests   Final    BOTTLES DRAWN AEROBIC AND ANAEROBIC Blood Culture adequate volume   Culture  Setup Time   Final    AEROBIC BOTTLE ONLY GRAM POSITIVE COCCI CRITICAL VALUE NOTED.  VALUE IS CONSISTENT WITH PREVIOUSLY REPORTED AND CALLED VALUE. Performed at [facility], [place] [address], [place], [place] [address]    Culture STAPHYLOCOCCUS WARNERI (A)  Final   Report Status 02/12/2021 FINAL  Final   Organism ID, Bacteria STAPHYLOCOCCUS WARNERI  Final      Susceptibility   Staphylococcus warneri - MIC*    CIPROFLOXACIN <=0.5 SENSITIVE Sensitive     ERYTHROMYCIN >=8 RESISTANT Resistant     GENTAMICIN <=0.5 SENSITIVE Sensitive     OXACILLIN >=4 RESISTANT Resistant     TETRACYCLINE <=1 SENSITIVE Sensitive     VANCOMYCIN <=0.5 SENSITIVE Sensitive     TRIMETH/SULFA <=10 SENSITIVE Sensitive     CLINDAMYCIN <=0.25 SENSITIVE Sensitive     RIFAMPIN <=0.5 SENSITIVE Sensitive     Inducible Clindamycin NEGATIVE  Sensitive     * STAPHYLOCOCCUS WARNERI  Culture, blood (routine  x 2)     Status: None (Preliminary result)   Collection Time: 02/11/21  7:50 AM   Specimen: BLOOD  Result Value Ref Range Status   Specimen Description BLOOD LEFT ANTECUBITAL  Final   Special Requests   Final    BOTTLES DRAWN AEROBIC ONLY Blood Culture adequate volume   Culture   Final    NO GROWTH 4 DAYS Performed at [facility], [address], [place], [place] [address]    Report Status PENDING  Incomplete  Culture, blood (routine x 2)     Status: None (Preliminary result)   Collection Time: 02/11/21  7:51 AM   Specimen: BLOOD  Result Value Ref Range Status   Specimen Description BLOOD RIGHT ANTECUBITAL  Final   Special Requests   Final    BOTTLES DRAWN AEROBIC ONLY Blood Culture results may not be optimal due to an excessive volume of blood received in culture bottles   Culture   Final    NO GROWTH 4 DAYS Performed at [facility], [place] [address], [place], [place] [address]    Report Status PENDING  Incomplete  Culture, blood (routine x 2)     Status: None (Preliminary result)   Collection Time: 02/14/21  6:39 PM   Specimen: BLOOD  Result Value Ref Range Status   Specimen Description BLOOD LEFT ANTECUBITAL  Final   Special Requests   Final    BOTTLES DRAWN AEROBIC AND ANAEROBIC Blood Culture adequate volume   Culture   Final    NO GROWTH < 12 HOURS Performed at [facility], [place] [address] [place], [place] [address]    Report Status PENDING  Incomplete  Culture, blood (routine x 2)     Status: None (Preliminary result)   Collection Time: 02/14/21  6:40 PM   Specimen: BLOOD LEFT ARM  Result Value Ref Range Status   Specimen Description BLOOD LEFT ARM  Final   Special Requests   Final    BOTTLES DRAWN AEROBIC AND ANAEROBIC Blood Culture adequate volume   Culture   Final    NO GROWTH < 12 HOURS Performed at [facility], [place] [address], [place], [place] [address]    Report  Status PENDING  Incomplete  Resp Panel by RT-PCR (Flu A&B, Covid) Nasopharyngeal Swab     Status: None   Collection Time: 02/14/21 10:23 PM   Specimen: Nasopharyngeal Swab; Nasopharyngeal(NP) swabs in vial transport medium  Result Value Ref Range Status   SARS Coronavirus 2 by RT PCR NEGATIVE NEGATIVE Final    Comment: (NOTE) SARS-CoV-2 target nucleic acids are NOT DETECTED.  The SARS-CoV-2 RNA is generally detectable in upper respiratory specimens during the acute phase of infection. The lowest concentration of SARS-CoV-2 viral copies this assay can detect is 138 copies/mL. A negative result does not preclude SARS-Cov-2 infection and should not be used as the sole basis for treatment or other patient management decisions. A negative result may occur with  improper specimen collection/handling, submission of specimen other than nasopharyngeal swab, presence of viral mutation(s) within the areas targeted by this assay, and inadequate number of viral copies(<138 copies/mL). A negative result must be combined with clinical observations, patient history, and epidemiological information. The expected result is Negative.  Fact Sheet for Patients:  EntrepreneurPulse.com.au  Fact Sheet for Healthcare Providers:  IncredibleEmployment.be  This test is no t yet approved or cleared by the [place] FDA and  has been authorized for detection and/or diagnosis  of SARS-CoV-2 by FDA under an Emergency Use Authorization (EUA). This EUA will remain  in effect (meaning this test can be used) for the duration of the COVID-19 declaration under Section 564(b)(1) of the Act, 21 U.S.C.section 360bbb-3(b)(1), unless the authorization is terminated  or revoked sooner.       Influenza A by PCR NEGATIVE NEGATIVE Final   Influenza B by PCR NEGATIVE NEGATIVE Final    Comment: (NOTE) The Xpert Xpress SARS-CoV-2/FLU/RSV plus assay is intended as an aid in the diagnosis  of influenza from Nasopharyngeal swab specimens and should not be used as a sole basis for treatment. Nasal washings and aspirates are unacceptable for Xpert Xpress SARS-CoV-2/FLU/RSV testing.  Fact Sheet for Patients: EntrepreneurPulse.com.au  Fact Sheet for Healthcare Providers: IncredibleEmployment.be  This test is not yet approved or cleared by the [place] FDA and has been authorized for detection and/or diagnosis of SARS-CoV-2 by FDA under an Emergency Use Authorization (EUA). This EUA will remain in effect (meaning this test can be used) for the duration of the COVID-19 declaration under Section 564(b)(1) of the Act, 21 U.S.C. section 360bbb-3(b)(1), unless the authorization is terminated or revoked.  Performed at [facility], [place] [address], [place], [place] [address]     Alcide Evener, [place] [facility] [phone] pager  02/15/2021, 11:01 AM

## 2021-02-15 NOTE — Progress Notes (Signed)
Dr. Raelyn Mora and Dr. Tommy Medal are aware of pt's temp of 100.26F orally. No signs of distress noted. NO complaints of chills. No needs or concerns at this time. Pt is resting in bed Comfortably. All other vital signs stable. Will continue to monitor.

## 2021-02-15 NOTE — Progress Notes (Signed)
Upper extremity and lower extremity venous has been completed.   Preliminary results in CV Proc.   Abram Sander 02/15/2021 3:17 PM

## 2021-02-16 ENCOUNTER — Telehealth: Payer: Self-pay

## 2021-02-16 LAB — CULTURE, BLOOD (ROUTINE X 2)
Culture: NO GROWTH
Culture: NO GROWTH
Special Requests: ADEQUATE

## 2021-02-16 LAB — HIV-1 RNA QUANT-NO REFLEX-BLD
HIV 1 RNA Quant: <20 copies/mL
LOG10 HIV-1 RNA: UNDETERMINED log10copy/mL

## 2021-02-16 LAB — RHEUMATOID FACTOR: Rheumatoid fact SerPl-aCnc: 12.4 IU/mL (ref <14.0)

## 2021-02-16 NOTE — Telephone Encounter (Signed)
Transition Care Management Follow-up Telephone Call  Date of discharge and from where: 02/15/2021 from [facility]  How have you been since you were released from the hospital? Feeling better, but still in a lot of pain  Any questions or concerns? No  Items Reviewed:  Did the pt receive and understand the discharge instructions provided? Yes   Medications obtained and verified? Yes   Other? No   Any new allergies since your discharge? No   Dietary orders reviewed? No  Do you have support at home? Yes   Home Care and Equipment/Supplies: Were home health services ordered? not applicable If so, what is the name of the agency? n/a  Has the agency set up a time to come to the patient's home? not applicable Were any new equipment or medical supplies ordered?  No What is the name of the medical supply agency? N/A Were you able to get the supplies/equipment? no Do you have any questions related to the use of the equipment or supplies? No  Functional Questionnaire: (I = Independent and D = Dependent) ADLs: I  Bathing/Dressing- I  Meal Prep- I  Eating- I  Maintaining continence- I  Transferring/Ambulation- I  Managing Meds- I  Follow up appointments reviewed:   PCP Hospital f/u appt confirmed? Yes  Scheduled to see Walker Kehr, MD on 02/21/2021 @ 9:10 am.  [facility] f/u appt confirmed? No    Are transportation arrangements needed? No   If their condition worsens, is the pt aware to call PCP or go to the Emergency Dept.? Yes  Was the patient provided with contact information for the PCP's office or ED? Yes  Was to pt encouraged to call back with questions or concerns? Yes

## 2021-02-18 LAB — QUANTIFERON-TB GOLD PLUS: QuantiFERON-TB Gold Plus: NEGATIVE

## 2021-02-18 LAB — QUANTIFERON-TB GOLD PLUS (RQFGPL)
QuantiFERON Mitogen Value: >10 IU/mL
QuantiFERON Nil Value: 0.02 IU/mL
QuantiFERON TB1 Ag Value: 0.03 IU/mL
QuantiFERON TB2 Ag Value: 0.04 IU/mL

## 2021-02-19 LAB — CULTURE, BLOOD (ROUTINE X 2)
Culture: NO GROWTH
Culture: NO GROWTH
Special Requests: ADEQUATE
Special Requests: ADEQUATE

## 2021-02-19 SURGERY — ECHOCARDIOGRAM, TRANSESOPHAGEAL
Anesthesia: Monitor Anesthesia Care

## 2021-02-19 NOTE — Discharge Summary (Signed)
Brief history:  80 year old female with history of mechanical mitral valve on Coumadin not taking consistently, chronic diastolic heart failure, type 2 diabetes on oral hypoglycemics, hypertension who was recently admitted to the hospital on 4/11 for A. fib with RVR, abdominal pain, diarrhea sepsis and ongoing temperature.  She clinically improved.  Remained afebrile and was discharged with no source of infection found.  Patient went home was doing fairly well, started having back pain as well as palpitations so came back to the ER.  Patient was not aware that she is running temperature at home.  She was mostly asymptomatic other than low back pain as well heart rate controlled with IV Cardizem.  Hemodynamically stable in the ER.  Temperature 101.  MRI of the lumbar spine with severe stenosis but no evidence of any infection.  INR is still subtherapeutic 1.6.  Admitted with fever of unknown origin, A. fib with RVR.   # A. fib with RVR: Due to temperature. Heart rate is controlled in sinus rhythm.  Resumed home medication including Cardizem CD 240 mg and Toprol-XL 50 mg and acceptable HR.    # Fever of unknown origin: Patient was found febrile on recent admission despite receiving Tylenol.  Extensive work-up on previous admission including CT scan of the chest abdomen pelvis, no findings TTE normal Left hip MRI, no evidence of infection HIV nonreactive, hepatitis panel negative Blood culture staph hominis 1/2 thought to be contaminant. Repeat blood cultures negative. COVID and influenza negative Procalcitonin normal. Patient came back with fever, needs further work-up.  Seen by infectious disease team. Further work-up including MRI of the lumbar spine, no evidence of infection Blood cultures and urine cultures 4/20 negative.   PET scan to look for source of infection, ordered.  Patient did not wait for it. DVT studies upper and lower extremities negative. Left popliteal fossa with no  evidence of abscess. TEE planned, however patient left AGAINST MEDICAL ADVICE.  Patient was fairly stable with episodic temperature 100-101.  Extensive work-up was planned including inpatient TEE.  Once her heart rate is controlled, she insisted on going home.  She did have temperature 100.7 at the time of leaving the hospital.  Despite extensive counseling to stay back in the hospital and finish work-up for fever of unknown origin, patient was insistent on going home.  She was adamant that the test we are doing is useless as we would have already found something by then.  Patient was clinically stable.  She was able to make her clinical decisions.  She was able to understand our recommendations to stay in the hospital and risks of getting worse symptoms, further deterioration and need to come back to the emergency room if any worsening symptoms.  There was no evidence of bacterial infection, no antibiotics were prescribed.  Encouraged her to follow-up with her outpatient providers and come back to the hospital/emergency room for any concerns including fever, weakness, palpitations or chest pain.   Patient left AMA on 02/15/21 evening with family.

## 2021-02-21 ENCOUNTER — Other Ambulatory Visit: Payer: Self-pay

## 2021-02-21 ENCOUNTER — Encounter: Payer: Self-pay | Admitting: Internal Medicine

## 2021-02-21 ENCOUNTER — Ambulatory Visit (INDEPENDENT_AMBULATORY_CARE_PROVIDER_SITE_OTHER): Payer: Medicare Other | Admitting: Internal Medicine

## 2021-02-21 DIAGNOSIS — R509 Fever, unspecified: Secondary | ICD-10-CM | POA: Diagnosis not present

## 2021-02-21 DIAGNOSIS — I10 Essential (primary) hypertension: Secondary | ICD-10-CM

## 2021-02-21 DIAGNOSIS — I4821 Permanent atrial fibrillation: Secondary | ICD-10-CM | POA: Diagnosis not present

## 2021-02-21 DIAGNOSIS — G8929 Other chronic pain: Secondary | ICD-10-CM

## 2021-02-21 DIAGNOSIS — M545 Low back pain, unspecified: Secondary | ICD-10-CM

## 2021-02-21 DIAGNOSIS — N1831 Chronic kidney disease, stage 3a: Secondary | ICD-10-CM | POA: Diagnosis not present

## 2021-02-21 MED ORDER — OXYCODONE HCL 5 MG PO TABS: 5.0000 mg | ORAL_TABLET | Freq: Four times a day (QID) | ORAL | 0 refills | Status: DC | PRN

## 2021-02-21 NOTE — Progress Notes (Signed)
Subjective:  Patient ID: Beth Miller, female    DOB: 1941-05-15  Age: 80 y.o. MRN: [id number]  CC: Follow-up (TCM Hosp f/u)   HPI TYKEISHA PEER presents for a post-hosp fu (fever, A fib). Pt left AMA. C/o LBP Per hx: " Brief history:  80 year old female with history of mechanical mitral valve on Coumadin not taking consistently, chronic diastolic heart failure, type 2 diabetes on oral hypoglycemics, hypertension who was recently admitted to the hospital on 4/11 for A. fib with RVR, abdominal pain, diarrhea sepsis and ongoing temperature. She clinically improved. Remained afebrile and was discharged with no source of infection found. Patient went home was doing fairly well, started having back pain as well as palpitations so came back to the ER.  Patient was not aware that she is running temperature at home. She was mostly asymptomatic other than low back pain as well heart rate controlled with IV Cardizem. Hemodynamically stable in the ER. Temperature 101. MRI of the lumbar spine with severe stenosis but no evidence of any infection. INR is still subtherapeutic 1.6. Admitted with fever of unknown origin, A. fib with RVR.   # A. fib with RVR: Due to temperature. Heart rate is controlled in sinus rhythm. Resumed home medication including Cardizem CD 240 mg and Toprol-XL 50 mg and acceptable HR.    # Fever of unknown origin: Patient was found febrile on recent admission despite receiving Tylenol. Extensive work-up on previous admission including CT scan of the chest abdomen pelvis,no findings TTE normal Left hip MRI,no evidence of infection HIV nonreactive, hepatitis panel negative Blood culture staph hominis 1/2 thought to be contaminant. Repeat blood cultures negative. COVID and influenza negative Procalcitonin normal. Patient came back with fever, needs further work-up.  Seen by infectious disease team. Further work-up including MRI of the lumbar spine, no  evidence of infection Blood cultures and urine cultures 4/20 negative.   PET scan to look for source of infection, ordered.  Patient did not wait for it. DVT studies upper and lower extremities negative. Left popliteal fossa with no evidence of abscess. TEE planned, however patient left AGAINST MEDICAL ADVICE.  Patient was fairly stable with episodic temperature 100-101.  Extensive work-up was planned including inpatient TEE.  Once her heart rate is controlled, she insisted on going home.  She did have temperature 100.7 at the time of leaving the hospital.  Despite extensive counseling to stay back in the hospital and finish work-up for fever of unknown origin, patient was insistent on going home.  She was adamant that the test we are doing is useless as we would have already found something by then.  Patient was clinically stable.  She was able to make her clinical decisions.  She was able to understand our recommendations to stay in the hospital and risks of getting worse symptoms, further deterioration and need to come back to the emergency room if any worsening symptoms.  There was no evidence of bacterial infection, no antibiotics were prescribed.  Encouraged her to follow-up with her outpatient providers and come back to the hospital/emergency room for any concerns including fever, weakness, palpitations or chest pain.   Patient left AMA on 02/15/21 evening with family.        "      Outpatient Medications Prior to Visit  Medication Sig Dispense Refill  . ACCU-CHEK AVIVA PLUS test strip TEST BLOOD SUGAR TWICE DAILY 200 strip 3  . ACCU-CHEK SOFTCLIX LANCETS lancets 1 each by Other route 2 (  two) times daily. Use to check blood sugars twice a day 200 each 3  . acetaminophen (TYLENOL) 500 MG tablet Take 1,000 mg by mouth every 6 (six) hours as needed for mild pain.    . Alcohol Swabs (B-D SINGLE USE SWABS REGULAR) PADS 1 each by Does not apply route 2 (two) times daily. Use to clean  finger to check blood sugars twice a day 200 each 3  . Blood Glucose Calibration (ACCU-CHEK AVIVA) SOLN 1 each by In Vitro route as directed. 3 each 1  . Blood Glucose Monitoring Suppl (ACCU-CHEK AVIVA PLUS) w/Device KIT 1 each by Does not apply route 2 (two) times daily. Use as directed 1 kit 0  . Cholecalciferol (VITAMIN D3) 1000 UNIT tablet Take 1,000 Units by mouth daily.    . cyclobenzaprine (FLEXERIL) 10 MG tablet Take 1 tablet (10 mg total) by mouth 3 (three) times daily as needed for muscle spasms. 30 tablet 0  . diltiazem (CARDIZEM CD) 240 MG 24 hr capsule Take 1 capsule (240 mg total) by mouth daily. 30 capsule 0  . furosemide (LASIX) 20 MG tablet Take 1 tablet (20 mg total) by mouth daily. Take in am 90 tablet 3  . gabapentin (NEURONTIN) 100 MG capsule Take 1 capsule (100 mg total) by mouth at bedtime. 90 capsule 3  . Lancet Devices (ACCU-CHEK SOFTCLIX) lancets 1 each by Other route 2 (two) times daily. Use as instructed 1 each 5  . loratadine (CLARITIN) 10 MG tablet Take 1 tablet (10 mg total) by mouth daily as needed for allergies. Take in am 100 tablet 3  . memantine (NAMENDA) 5 MG tablet Take 1 tablet (5 mg total) by mouth 2 (two) times daily. 180 tablet 3  . metFORMIN (GLUCOPHAGE) 500 MG tablet Take 1 tablet (500 mg total) by mouth daily with breakfast. 90 tablet 3  . metoprolol succinate (TOPROL XL) 25 MG 24 hr tablet Take 1 tablet (25 mg total) by mouth daily. 30 tablet 0  . mirabegron ER (MYRBETRIQ) 25 MG TB24 tablet Take 1 tablet (25 mg total) by mouth daily. 90 tablet 3  . oxyCODONE (OXY IR/ROXICODONE) 5 MG immediate release tablet Take 1 tablet (5 mg total) by mouth every 6 (six) hours as needed for severe pain. 10 tablet 0  . pantoprazole (PROTONIX) 40 MG tablet Take 1 tablet (40 mg total) by mouth daily. Take in am 90 tablet 3  . potassium chloride (KLOR-CON) 10 MEQ tablet Take 1 tablet (10 mEq total) by mouth daily. 90 tablet 3  . vitamin B-12 (CYANOCOBALAMIN) 1000 MCG  tablet Take 1,000 mcg by mouth daily.    Marland Kitchen warfarin (COUMADIN) 10 MG tablet Please take 15 mg daily and have IN[facility] checked on Monday 4[facility]. (Patient taking differently: Take 10-15 mg by mouth See admin instructions. Please take 15 mg daily on Monday & Friday, all other days 18m)    . amoxicillin (AMOXIL) 500 MG tablet Take four capsules (2,000 mg) one hour prior to all dental visits. (Patient not taking: Reported on 02/21/2021) 8 tablet 11   No facility-administered medications prior to visit.    ROS: Review of Systems  Constitutional: Negative for activity change, appetite change, chills, diaphoresis, fatigue, fever and unexpected weight change.  HENT: Negative for congestion, mouth sores and sinus pressure.   Eyes: Negative for visual disturbance.  Respiratory: Negative for cough, chest tightness and shortness of breath.   Cardiovascular: Negative for palpitations and leg swelling.  Gastrointestinal: Negative for  abdominal pain and nausea.  Genitourinary: Negative for difficulty urinating, frequency and vaginal pain.  Musculoskeletal: Positive for back pain. Negative for gait problem.  Skin: Negative for pallor and rash.  Neurological: Negative for dizziness, tremors, weakness, numbness and headaches.  Psychiatric/Behavioral: Negative for confusion and sleep disturbance.    Objective:  BP 120/82 (BP Location: Left Arm)   Pulse 94   Temp 97.8 F (36.6 C) (Oral)   Ht _0  (1.575 m)   Wt 167 lb (75.8 kg)   LMP  (LMP Unknown)   SpO2 98%   BMI 30.54 kg/m   BP Readings from Last 3 Encounters:  02/21/21 120/82  02/15/21 114/73  02/12/21 (!) 96/54    Wt Readings from Last 3 Encounters:  02/21/21 167 lb (75.8 kg)  02/15/21 176 lb 12.9 oz (80.2 kg)  02/12/21 176 lb 9.4 oz (80.1 kg)    Physical Exam Constitutional:      General: She is not in acute distress.    Appearance: She is well-developed. She is obese.  HENT:     Head: Normocephalic.     Right  Ear: External ear normal.     Left Ear: External ear normal.     Nose: Nose normal.  Eyes:     General:        Right eye: No discharge.        Left eye: No discharge.     Conjunctiva/sclera: Conjunctivae normal.     Pupils: Pupils are equal, round, and reactive to light.  Neck:     Thyroid: No thyromegaly.     Vascular: No JVD.     Trachea: No tracheal deviation.  Cardiovascular:     Rate and Rhythm: Regular rhythm. Tachycardia present.     Heart sounds: Normal heart sounds.  Pulmonary:     Effort: No respiratory distress.     Breath sounds: No stridor. No wheezing.  Abdominal:     General: Bowel sounds are normal. There is no distension.     Palpations: Abdomen is soft. There is no mass.     Tenderness: There is no abdominal tenderness. There is no guarding or rebound.  Musculoskeletal:        General: Tenderness present.     Cervical back: Normal range of motion and neck supple.  Lymphadenopathy:     Cervical: No cervical adenopathy.  Skin:    Findings: No erythema or rash.  Neurological:     Mental Status: She is oriented to person, place, and time.     Cranial Nerves: No cranial nerve deficit.     Motor: No abnormal muscle tone.     Coordination: Coordination normal.     Deep Tendon Reflexes: Reflexes normal.  Psychiatric:        Behavior: Behavior normal.        Thought Content: Thought content normal.        Judgment: Judgment normal.    LS spine is tender w/ROM   Lab Results  Component Value Date   WBC 8.4 02/14/2021   HGB 9.5 (L) 02/14/2021   HCT 28.0 (L) 02/14/2021   PLT 409 (H) 02/14/2021   GLUCOSE 134 (H) 02/14/2021   CHOL 106 04/27/2019   TRIG 177.0 (H) 04/27/2019   HDL 37.40 (L) 04/27/2019   LDLCALC 33 04/27/2019   ALT 32 02/14/2021   AST 26 02/14/2021   NA 136 02/14/2021   K 3.9 02/14/2021   CL 100 02/14/2021   CREATININE 1.00 02/14/2021   BUN  16 02/14/2021   CO2 24 02/14/2021   TSH 3.56 01/30/2021   INR 1.6 (H) 02/14/2021   HGBA1C 6.8  (H) 01/30/2021    MR Lumbar Spine W Wo Contrast  Result Date: 02/14/2021 CLINICAL DATA:  Low back pain EXAM: MRI LUMBAR SPINE WITHOUT AND WITH CONTRAST TECHNIQUE: Multiplanar and multiecho pulse sequences of the lumbar spine were obtained without and with intravenous contrast. CONTRAST:  58m GADAVIST GADOBUTROL 1 MMOL/ML IV SOLN COMPARISON:  Pain FINDINGS: Segmentation:  Standard. Alignment:  Grade 1 anterolisthesis at L4-5 Vertebrae:  No fracture, evidence of discitis, or bone lesion. Conus medullaris and cauda equina: Conus extends to the L1 level. Conus and cauda equina appear normal. Paraspinal and other soft tissues: Negative Disc levels: The disc levels above L4 are unremarkable. L4-5: Small disc bulge with severe facet hypertrophy. Severe spinal canal stenosis and severe left foraminal stenosis. L5-S1: Severe facet arthrosis and mild disc bulge. No spinal canal stenosis. Mild bilateral neural foraminal stenosis. IMPRESSION: 1. Severe spinal canal stenosis and left foraminal stenosis at L4-L5 due to combination of disc bulge and severe facet arthrosis. 2. Severe facet arthrosis at L5-S1 with mild bilateral neural foraminal stenosis. Electronically Signed   By: KUlyses JarredM.D.   On: 02/14/2021 22:15   DG Chest Port 1 View  Result Date: 02/14/2021 CLINICAL DATA:  Sepsis, back pain EXAM: PORTABLE CHEST 1 VIEW COMPARISON:  02/05/2021 FINDINGS: Prior CABG. Cardiomegaly. Vascular congestion. No overt edema, confluent opacities or effusions. No acute bony abnormality. IMPRESSION: Cardiomegaly, vascular congestion. Electronically Signed   By: KRolm BaptiseM.D.   On: 02/14/2021 19:58   VAS [place]OWER EXTREMITY VENOUS (DVT)  Result Date: 02/15/2021  Lower Venous DVT Study Indications: Fever of unknown origin.  Comparison Study: No previous exams Performing Technologist: JRogelia Rohrer Examination Guidelines: A complete evaluation includes B-mode imaging, spectral Doppler, color Doppler, and power Doppler as  needed of all accessible portions of each vessel. Bilateral testing is considered an integral part of a complete examination. Limited examinations for reoccurring indications may be performed as noted. The reflux portion of the exam is performed with the patient in reverse Trendelenburg.  +---------+---------------+---------+-----------+----------+--------------+ RIGHT    CompressibilityPhasicitySpontaneityPropertiesThrombus Aging +---------+---------------+---------+-----------+----------+--------------+ CFV      Full           Yes      Yes                                 +---------+---------------+---------+-----------+----------+--------------+ SFJ      Full                                                        +---------+---------------+---------+-----------+----------+--------------+ FV Prox  Full           Yes      Yes                                 +---------+---------------+---------+-----------+----------+--------------+ FV Mid   Full           Yes      Yes                                 +---------+---------------+---------+-----------+----------+--------------+  FV DistalFull           Yes      Yes                                 +---------+---------------+---------+-----------+----------+--------------+ PFV      Full                                                        +---------+---------------+---------+-----------+----------+--------------+ POP      Full           Yes      Yes                                 +---------+---------------+---------+-----------+----------+--------------+ PTV      Full                                                        +---------+---------------+---------+-----------+----------+--------------+ PERO     Full                                                        +---------+---------------+---------+-----------+----------+--------------+    +---------+---------------+---------+-----------+----------+--------------+ LEFT     CompressibilityPhasicitySpontaneityPropertiesThrombus Aging +---------+---------------+---------+-----------+----------+--------------+ CFV      Full           Yes      Yes                                 +---------+---------------+---------+-----------+----------+--------------+ SFJ      Full                                                        +---------+---------------+---------+-----------+----------+--------------+ FV Prox  Full           Yes      Yes                                 +---------+---------------+---------+-----------+----------+--------------+ FV Mid   Full           Yes      Yes                                 +---------+---------------+---------+-----------+----------+--------------+ FV DistalFull           Yes      Yes                                 +---------+---------------+---------+-----------+----------+--------------+ PFV      Full                                                        +---------+---------------+---------+-----------+----------+--------------+  POP      Full           Yes      Yes                                 +---------+---------------+---------+-----------+----------+--------------+ PTV      Full                                                        +---------+---------------+---------+-----------+----------+--------------+ PERO     Full                                                        +---------+---------------+---------+-----------+----------+--------------+     Summary: BILATERAL: - No evidence of deep vein thrombosis seen in the lower extremities, bilaterally. - No evidence of superficial venous thrombosis in the lower extremities, bilaterally. -No evidence of popliteal cyst, bilaterally.   *See table(s) above for measurements and observations. Electronically signed by Harold Barban MD on 02/15/2021 at  9:25:19 PM.    Final    VAS [place] UPPER EXTREMITY VENOUS DUPLEX  Result Date: 02/15/2021 UPPER VENOUS STUDY  Indications: fever Comparison Study: no prior Performing Technologist: Abram Sander RVS  Examination Guidelines: A complete evaluation includes B-mode imaging, spectral Doppler, color Doppler, and power Doppler as needed of all accessible portions of each vessel. Bilateral testing is considered an integral part of a complete examination. Limited examinations for reoccurring indications may be performed as noted.  Right Findings: +----------+------------+---------+-----------+----------+-------+ RIGHT     CompressiblePhasicitySpontaneousPropertiesSummary +----------+------------+---------+-----------+----------+-------+ IJV           Full       Yes       Yes                      +----------+------------+---------+-----------+----------+-------+ Subclavian    Full       Yes       Yes                      +----------+------------+---------+-----------+----------+-------+ Axillary      Full       Yes       Yes                      +----------+------------+---------+-----------+----------+-------+ Brachial      Full       Yes       Yes                      +----------+------------+---------+-----------+----------+-------+ Radial        Full                                          +----------+------------+---------+-----------+----------+-------+ Ulnar         Full                                          +----------+------------+---------+-----------+----------+-------+  Cephalic      Full                                          +----------+------------+---------+-----------+----------+-------+ Basilic       Full                                          +----------+------------+---------+-----------+----------+-------+  Left Findings: +----------+------------+---------+-----------+----------+-------+ LEFT       CompressiblePhasicitySpontaneousPropertiesSummary +----------+------------+---------+-----------+----------+-------+ IJV           Full       Yes       Yes                      +----------+------------+---------+-----------+----------+-------+ Subclavian    Full       Yes       Yes                      +----------+------------+---------+-----------+----------+-------+ Axillary      Full       Yes       Yes                      +----------+------------+---------+-----------+----------+-------+ Brachial      Full       Yes       Yes                      +----------+------------+---------+-----------+----------+-------+ Radial        Full                                          +----------+------------+---------+-----------+----------+-------+ Ulnar         Full                                          +----------+------------+---------+-----------+----------+-------+ Cephalic      Full                                          +----------+------------+---------+-----------+----------+-------+ Basilic       Full                                          +----------+------------+---------+-----------+----------+-------+  Summary: No evidence of deep vein or superficial vein thrombosis involving the right and left upper extremities.  *See table(s) above for measurements and observations.  Diagnosing physician: Harold Barban MD Electronically signed by Harold Barban MD on 02/15/2021 at 9:25:47 PM.    Final     Assessment & Plan:    Walker Kehr, MD

## 2021-02-21 NOTE — Assessment & Plan Note (Addendum)
A flare-up. On Oxycodone prn RTC 6 wks  Chronic MSK  LS MRI IMPRESSION: 1. Severe spinal canal stenosis and left foraminal stenosis at L4-L5 due to combination of disc bulge and severe facet arthrosis. 2. Severe facet arthrosis at L5-S1 with mild bilateral neural foraminal stenosis. Electronically Signed   By: Ulyses Jarred M.D.   On: 02/14/2021 22:15

## 2021-02-21 NOTE — Assessment & Plan Note (Signed)
On Furosemide, [place] 

## 2021-02-21 NOTE — Assessment & Plan Note (Signed)
Staph hominis (+) cx - a contamination Fever resolved

## 2021-02-21 NOTE — Assessment & Plan Note (Signed)
Monitor GFR 

## 2021-02-21 NOTE — Assessment & Plan Note (Signed)
Rate controlled Cont w/Cartia, Coumadin, Toprol

## 2021-02-22 LAB — HLA-B27 ANTIGEN: HLA-B27: NEGATIVE

## 2021-02-28 ENCOUNTER — Ambulatory Visit: Payer: Medicare Other

## 2021-03-01 ENCOUNTER — Telehealth: Payer: Self-pay | Admitting: Internal Medicine

## 2021-03-01 NOTE — Telephone Encounter (Signed)
LVM for pt to rtn my call to r/s appt with nha. Please r/s appt if pt calls the office. 

## 2021-03-02 ENCOUNTER — Ambulatory Visit: Payer: Medicare Other

## 2021-03-02 ENCOUNTER — Other Ambulatory Visit: Payer: Self-pay

## 2021-03-02 ENCOUNTER — Ambulatory Visit (INDEPENDENT_AMBULATORY_CARE_PROVIDER_SITE_OTHER): Payer: Medicare Other

## 2021-03-02 DIAGNOSIS — Z9889 Other specified postprocedural states: Secondary | ICD-10-CM | POA: Diagnosis not present

## 2021-03-02 DIAGNOSIS — I4891 Unspecified atrial fibrillation: Secondary | ICD-10-CM | POA: Diagnosis not present

## 2021-03-02 DIAGNOSIS — Z5181 Encounter for therapeutic drug level monitoring: Secondary | ICD-10-CM | POA: Diagnosis not present

## 2021-03-02 DIAGNOSIS — M7918 Myalgia, other site: Secondary | ICD-10-CM

## 2021-03-02 DIAGNOSIS — Z7901 Long term (current) use of anticoagulants: Secondary | ICD-10-CM

## 2021-03-02 LAB — POCT INR: INR: 1.8 — AB (ref 2.0–3.0)

## 2021-03-02 NOTE — Patient Instructions (Signed)
-   take 20 mg warfarin tonight & tomorrow, then  - resume taking 10 mg daily except for 15 mg on Mondays and Fridays.  - Recheck INR in 3 weeks.  [facility] [phone]

## 2021-03-14 ENCOUNTER — Ambulatory Visit: Payer: Medicare Other

## 2021-03-15 ENCOUNTER — Telehealth: Payer: Self-pay | Admitting: Cardiovascular Disease

## 2021-03-15 ENCOUNTER — Telehealth: Payer: Self-pay | Admitting: Internal Medicine

## 2021-03-15 MED ORDER — DILTIAZEM HCL ER COATED BEADS 240 MG PO CP24: 240.0000 mg | ORAL_CAPSULE | Freq: Every day | ORAL | 1 refills | Status: DC

## 2021-03-15 NOTE — Telephone Encounter (Signed)
*  STAT* If patient is at the pharmacy, call can be transferred to refill team.   1. Which medications need to be refilled? (please list name of each medication and dose if known) diltiazem   2. Which pharmacy/location (including street and city if local pharmacy) is medication to be sent to? [phone] [facility] pharmacy  3. Do they need a 30 day or 90 day supply? Not sure     Pharmacy calling stating that the patient states she's to take 240 mg from when she was in the hospital and to make sure that it is 240 mg and 140 mg

## 2021-03-15 NOTE — Telephone Encounter (Signed)
Reviewed chart pt is up-to-date since pharmacy not in epic printed rx and faxed to [facility] [phone].Marland KitchenJohny Chess

## 2021-03-15 NOTE — Telephone Encounter (Signed)
Called pharmacy to inform them that pt's PCP refills pt's diltiazem and that they needed to contact Dr. Alain Marion. Pharmacy tech verbalized understanding.

## 2021-03-15 NOTE — Telephone Encounter (Signed)
  diltiazem (CARDIZEM CD) 240 MG 24 hr capsule [facility]  [place] [address] Phone# [phone] [phone]  Patient got her prescriptions transferred to pharmacy above and they had her old dosage so they need [place] to send in the 240 mg

## 2021-03-20 ENCOUNTER — Telehealth: Payer: Self-pay | Admitting: Cardiovascular Disease

## 2021-03-20 ENCOUNTER — Ambulatory Visit (INDEPENDENT_AMBULATORY_CARE_PROVIDER_SITE_OTHER): Payer: Medicare Other | Admitting: *Deleted

## 2021-03-20 ENCOUNTER — Other Ambulatory Visit: Payer: Self-pay

## 2021-03-20 DIAGNOSIS — Z5181 Encounter for therapeutic drug level monitoring: Secondary | ICD-10-CM

## 2021-03-20 DIAGNOSIS — Z7901 Long term (current) use of anticoagulants: Secondary | ICD-10-CM

## 2021-03-20 DIAGNOSIS — I4891 Unspecified atrial fibrillation: Secondary | ICD-10-CM | POA: Diagnosis not present

## 2021-03-20 LAB — POCT INR: INR: 1.3 — AB (ref 2.0–3.0)

## 2021-03-20 NOTE — Telephone Encounter (Signed)
Refill dept notified me that PCP has been refilling diltiazem for this patient. I called and informed her of this information.  She acknowledges understanding and will follow up with PCP.

## 2021-03-20 NOTE — Telephone Encounter (Signed)
New message    Patient stopped by the check out desk to schedule an annual visit with Dr Johnsie Cancel.  While there, pt stated that she had recently been in the hosp for back pain and while there she went into AFIB.  Her cardizem was changed from 180mg  daily to 240mg  daily.  She ran out of 240mg  on 5-20 and on 5-21 started taking the 180mg  because she had pills left over.  She want Dr Johnsie Cancel to know that she is still in AFIB. First available appt with Dr Johnsie Cancel was July 18th.  Should Dr Johnsie Cancel see pt before that date?  If he want to keep her on the 240mg , she will need a refill sent in to [facility] [address].  Please call patient and let her know if she should stay on the 180mg  or resume taking the 240mg  and what to do about the AFIB.

## 2021-03-20 NOTE — Patient Instructions (Signed)
Description   -Take Warfarin 15 mg today and tomorrow -Then start taking warfarin 10mg  daily except for 15mg  on Monday, Thursday and Saturday.  -Recheck INR in 1 week. [facility] [phone].

## 2021-03-21 NOTE — Telephone Encounter (Signed)
This encounter was created in error - please disregard.

## 2021-03-22 ENCOUNTER — Other Ambulatory Visit: Payer: Self-pay | Admitting: Pharmacist

## 2021-03-22 ENCOUNTER — Other Ambulatory Visit: Payer: Self-pay

## 2021-03-22 DIAGNOSIS — I4821 Permanent atrial fibrillation: Secondary | ICD-10-CM

## 2021-03-22 MED ORDER — WARFARIN SODIUM 10 MG PO TABS: ORAL_TABLET | ORAL | 0 refills | Status: DC

## 2021-03-27 ENCOUNTER — Ambulatory Visit (INDEPENDENT_AMBULATORY_CARE_PROVIDER_SITE_OTHER): Payer: Medicare Other | Admitting: Pharmacist

## 2021-03-27 ENCOUNTER — Other Ambulatory Visit: Payer: Self-pay

## 2021-03-27 DIAGNOSIS — I4891 Unspecified atrial fibrillation: Secondary | ICD-10-CM

## 2021-03-27 DIAGNOSIS — Z7901 Long term (current) use of anticoagulants: Secondary | ICD-10-CM | POA: Diagnosis not present

## 2021-03-27 DIAGNOSIS — Z9889 Other specified postprocedural states: Secondary | ICD-10-CM

## 2021-03-27 LAB — POCT INR: INR: 2.1 (ref 2.0–3.0)

## 2021-03-27 NOTE — Patient Instructions (Signed)
-  Take Warfarin 15 mg today  -Then start taking warfarin 15mg  daily except for 10mg  on Monday, Wed and Friday -Recheck INR in 1 week. [facility] [phone].

## 2021-03-29 ENCOUNTER — Ambulatory Visit: Payer: Medicare Other

## 2021-04-04 ENCOUNTER — Other Ambulatory Visit: Payer: Self-pay

## 2021-04-04 ENCOUNTER — Ambulatory Visit (INDEPENDENT_AMBULATORY_CARE_PROVIDER_SITE_OTHER): Payer: Medicare Other | Admitting: Internal Medicine

## 2021-04-04 ENCOUNTER — Encounter: Payer: Self-pay | Admitting: Internal Medicine

## 2021-04-04 VITALS — BP 141/80 | HR 105 | Temp 98.6°F | Ht 62.0 in | Wt 171.4 lb

## 2021-04-04 DIAGNOSIS — M898X1 Other specified disorders of bone, shoulder: Secondary | ICD-10-CM

## 2021-04-04 DIAGNOSIS — E1122 Type 2 diabetes mellitus with diabetic chronic kidney disease: Secondary | ICD-10-CM

## 2021-04-04 DIAGNOSIS — I10 Essential (primary) hypertension: Secondary | ICD-10-CM

## 2021-04-04 DIAGNOSIS — I4821 Permanent atrial fibrillation: Secondary | ICD-10-CM | POA: Diagnosis not present

## 2021-04-04 DIAGNOSIS — N1831 Chronic kidney disease, stage 3a: Secondary | ICD-10-CM | POA: Diagnosis not present

## 2021-04-04 DIAGNOSIS — E559 Vitamin D deficiency, unspecified: Secondary | ICD-10-CM

## 2021-04-04 DIAGNOSIS — Z7901 Long term (current) use of anticoagulants: Secondary | ICD-10-CM | POA: Diagnosis not present

## 2021-04-04 LAB — CBC WITH DIFFERENTIAL/PLATELET
Basophils Absolute: 0.1 10*3/uL (ref 0.0–0.1)
Basophils Relative: 1.2 % (ref 0.0–3.0)
Eosinophils Absolute: 0.1 10*3/uL (ref 0.0–0.7)
Eosinophils Relative: 1.9 % (ref 0.0–5.0)
HCT: 35.6 % — ABNORMAL LOW (ref 36.0–46.0)
Hemoglobin: 11.5 g/dL — ABNORMAL LOW (ref 12.0–15.0)
Lymphocytes Relative: 32.5 % (ref 12.0–46.0)
Lymphs Abs: 1.6 10*3/uL (ref 0.7–4.0)
MCHC: 32.4 g/dL (ref 30.0–36.0)
MCV: 80 fl (ref 78.0–100.0)
Monocytes Absolute: 0.6 10*3/uL (ref 0.1–1.0)
Monocytes Relative: 12.7 % — ABNORMAL HIGH (ref 3.0–12.0)
Neutro Abs: 2.6 10*3/uL (ref 1.4–7.7)
Neutrophils Relative %: 51.7 % (ref 43.0–77.0)
Platelets: 343 10*3/uL (ref 150.0–400.0)
RBC: 4.45 Mil/uL (ref 3.87–5.11)
RDW: 17.6 % — ABNORMAL HIGH (ref 11.5–15.5)
WBC: 5.1 10*3/uL (ref 4.0–10.5)

## 2021-04-04 LAB — BASIC METABOLIC PANEL
BUN: 17 mg/dL (ref 6–23)
CO2: 35 mEq/L — ABNORMAL HIGH (ref 19–32)
Calcium: 10.5 mg/dL (ref 8.4–10.5)
Chloride: 100 mEq/L (ref 96–112)
Creatinine, Ser: 0.97 mg/dL (ref 0.40–1.20)
GFR: 54.75 mL/min — ABNORMAL LOW (ref >60.00)
Glucose, Bld: 136 mg/dL — ABNORMAL HIGH (ref 70–99)
Potassium: 4.3 mEq/L (ref 3.5–5.1)
Sodium: 140 mEq/L (ref 135–145)

## 2021-04-04 LAB — HEMOGLOBIN A1C: Hgb A1c MFr Bld: 6.9 % — ABNORMAL HIGH (ref 4.6–6.5)

## 2021-04-04 MED ORDER — METFORMIN HCL 500 MG PO TABS: 500.0000 mg | ORAL_TABLET | Freq: Every day | ORAL | 3 refills | Status: DC

## 2021-04-04 MED ORDER — WARFARIN SODIUM 5 MG PO TABS: ORAL_TABLET | ORAL | 3 refills | Status: DC

## 2021-04-04 MED ORDER — TRIAMCINOLONE ACETONIDE 0.5 % EX CREA: 1.0000 "application " | TOPICAL_CREAM | Freq: Three times a day (TID) | CUTANEOUS | 1 refills | Status: AC | PRN

## 2021-04-04 NOTE — Assessment & Plan Note (Signed)
Cont w/Vit D 

## 2021-04-04 NOTE — Assessment & Plan Note (Signed)
Cont w/Cartia, Coumadin, Toprol

## 2021-04-04 NOTE — Progress Notes (Signed)
Subjective:  Patient ID: Beth Miller, female    DOB: 1941-08-01  Age: 80 y.o. MRN: [id number]  CC: Follow-up (6 week f/u- Req refill on Metformin & Triamcinolone cream)   HPI Beth Miller presents for R shoulder blade pain  X 2 days - worse w/moving  F/u HTN, DM, rash  Outpatient Medications Prior to Visit  Medication Sig Dispense Refill  . ACCU-CHEK AVIVA PLUS test strip TEST BLOOD SUGAR TWICE DAILY 200 strip 3  . ACCU-CHEK SOFTCLIX LANCETS lancets 1 each by Other route 2 (two) times daily. Use to check blood sugars twice a day 200 each 3  . acetaminophen (TYLENOL) 500 MG tablet Take 1,000 mg by mouth every 6 (six) hours as needed for mild pain.    . Alcohol Swabs (B-D SINGLE USE SWABS REGULAR) PADS 1 each by Does not apply route 2 (two) times daily. Use to clean finger to check blood sugars twice a day 200 each 3  . Blood Glucose Calibration (ACCU-CHEK AVIVA) SOLN 1 each by In Vitro route as directed. 3 each 1  . Blood Glucose Monitoring Suppl (ACCU-CHEK AVIVA PLUS) w/Device KIT 1 each by Does not apply route 2 (two) times daily. Use as directed 1 kit 0  . Cholecalciferol (VITAMIN D3) 1000 UNIT tablet Take 1,000 Units by mouth daily.    . cyclobenzaprine (FLEXERIL) 10 MG tablet Take 1 tablet (10 mg total) by mouth 3 (three) times daily as needed for muscle spasms. 30 tablet 0  . diltiazem (CARDIZEM CD) 240 MG 24 hr capsule Take 1 capsule (240 mg total) by mouth daily. 90 capsule 1  . furosemide (LASIX) 20 MG tablet Take 1 tablet (20 mg total) by mouth daily. Take in am 90 tablet 3  . gabapentin (NEURONTIN) 100 MG capsule Take 1 capsule (100 mg total) by mouth at bedtime. 90 capsule 3  . Lancet Devices (ACCU-CHEK SOFTCLIX) lancets 1 each by Other route 2 (two) times daily. Use as instructed 1 each 5  . loratadine (CLARITIN) 10 MG tablet Take 1 tablet (10 mg total) by mouth daily as needed for allergies. Take in am 100 tablet 3  . memantine (NAMENDA) 5 MG tablet Take 1 tablet (5  mg total) by mouth 2 (two) times daily. 180 tablet 3  . metFORMIN (GLUCOPHAGE) 500 MG tablet Take 1 tablet (500 mg total) by mouth daily with breakfast. 90 tablet 3  . metoprolol succinate (TOPROL XL) 25 MG 24 hr tablet Take 1 tablet (25 mg total) by mouth daily. 30 tablet 0  . mirabegron ER (MYRBETRIQ) 25 MG TB24 tablet Take 1 tablet (25 mg total) by mouth daily. 90 tablet 3  . oxyCODONE (OXY IR/ROXICODONE) 5 MG immediate release tablet Take 1 tablet (5 mg total) by mouth every 6 (six) hours as needed for severe pain. 20 tablet 0  . pantoprazole (PROTONIX) 40 MG tablet Take 1 tablet (40 mg total) by mouth daily. Take in am 90 tablet 3  . potassium chloride (KLOR-CON) 10 MEQ tablet Take 1 tablet (10 mEq total) by mouth daily. 90 tablet 3  . vitamin B-12 (CYANOCOBALAMIN) 1000 MCG tablet Take 1,000 mcg by mouth daily.    Marland Kitchen warfarin (COUMADIN) 10 MG tablet Take 1 to 2 tablets daily or as directed by Coumadin clinic 90 tablet 0  . amoxicillin (AMOXIL) 500 MG tablet Take four capsules (2,000 mg) one hour prior to all dental visits. (Patient not taking: No sig reported) 8 tablet 11   No facility-administered medications  prior to visit.    ROS: Review of Systems  Constitutional: Negative for activity change, appetite change, chills, fatigue and unexpected weight change.  HENT: Negative for congestion, mouth sores and sinus pressure.   Eyes: Negative for visual disturbance.  Respiratory: Negative for cough and chest tightness.   Gastrointestinal: Negative for abdominal pain and nausea.  Genitourinary: Negative for difficulty urinating, frequency and vaginal pain.  Musculoskeletal: Positive for arthralgias and back pain. Negative for gait problem.  Skin: Negative for pallor and rash.  Neurological: Negative for dizziness, tremors, weakness, numbness and headaches.  Psychiatric/Behavioral: Negative for confusion and sleep disturbance.    Objective:  BP (!) 141/80 (BP Location: Left Arm)   Pulse  (!) 105   Temp 98.6 F (37 C) (Oral)   Ht 5' 2"  (1.575 m)   Wt 171 lb 6.4 oz (77.7 kg)   LMP  (LMP Unknown)   SpO2 95%   BMI 31.35 kg/m   BP Readings from Last 3 Encounters:  04/04/21 (!) 141/80  02/21/21 120/82  02/15/21 114/73    Wt Readings from Last 3 Encounters:  04/04/21 171 lb 6.4 oz (77.7 kg)  02/21/21 167 lb (75.8 kg)  02/15/21 176 lb 12.9 oz (80.2 kg)    Physical Exam Constitutional:      General: She is not in acute distress.    Appearance: She is well-developed.  HENT:     Head: Normocephalic.     Right Ear: External ear normal.     Left Ear: External ear normal.     Nose: Nose normal.  Eyes:     General:        Right eye: No discharge.        Left eye: No discharge.     Conjunctiva/sclera: Conjunctivae normal.     Pupils: Pupils are equal, round, and reactive to light.  Neck:     Thyroid: No thyromegaly.     Vascular: No JVD.     Trachea: No tracheal deviation.  Cardiovascular:     Rate and Rhythm: Normal rate and regular rhythm.     Heart sounds: Normal heart sounds.  Pulmonary:     Effort: No respiratory distress.     Breath sounds: No stridor. No wheezing.  Abdominal:     General: Bowel sounds are normal. There is no distension.     Palpations: Abdomen is soft. There is no mass.     Tenderness: There is no abdominal tenderness. There is no guarding or rebound.  Musculoskeletal:        General: No tenderness.     Cervical back: Normal range of motion and neck supple.  Lymphadenopathy:     Cervical: No cervical adenopathy.  Skin:    Findings: No erythema or rash.  Neurological:     Mental Status: She is oriented to person, place, and time.     Cranial Nerves: No cranial nerve deficit.     Motor: No abnormal muscle tone.     Coordination: Coordination normal.     Deep Tendon Reflexes: Reflexes normal.  Psychiatric:        Behavior: Behavior normal.        Thought Content: Thought content normal.        Judgment: Judgment normal.      Lab Results  Component Value Date   WBC 8.4 02/14/2021   HGB 9.5 (L) 02/14/2021   HCT 28.0 (L) 02/14/2021   PLT 409 (H) 02/14/2021   GLUCOSE 134 (H) 02/14/2021  CHOL 106 04/27/2019   TRIG 177.0 (H) 04/27/2019   HDL 37.40 (L) 04/27/2019   LDLCALC 33 04/27/2019   ALT 32 02/14/2021   AST 26 02/14/2021   NA 136 02/14/2021   K 3.9 02/14/2021   CL 100 02/14/2021   CREATININE 1.00 02/14/2021   BUN 16 02/14/2021   CO2 24 02/14/2021   TSH 3.56 01/30/2021   INR 2.1 03/27/2021   HGBA1C 6.8 (H) 01/30/2021    MR Lumbar Spine W Wo Contrast  Result Date: 02/14/2021 CLINICAL DATA:  Low back pain EXAM: MRI LUMBAR SPINE WITHOUT AND WITH CONTRAST TECHNIQUE: Multiplanar and multiecho pulse sequences of the lumbar spine were obtained without and with intravenous contrast. CONTRAST:  66m GADAVIST GADOBUTROL 1 MMOL/ML IV SOLN COMPARISON:  Pain FINDINGS: Segmentation:  Standard. Alignment:  Grade 1 anterolisthesis at L4-5 Vertebrae:  No fracture, evidence of discitis, or bone lesion. Conus medullaris and cauda equina: Conus extends to the L1 level. Conus and cauda equina appear normal. Paraspinal and other soft tissues: Negative Disc levels: The disc levels above L4 are unremarkable. L4-5: Small disc bulge with severe facet hypertrophy. Severe spinal canal stenosis and severe left foraminal stenosis. L5-S1: Severe facet arthrosis and mild disc bulge. No spinal canal stenosis. Mild bilateral neural foraminal stenosis. IMPRESSION: 1. Severe spinal canal stenosis and left foraminal stenosis at L4-L5 due to combination of disc bulge and severe facet arthrosis. 2. Severe facet arthrosis at L5-S1 with mild bilateral neural foraminal stenosis. Electronically Signed   By: KUlyses JarredM.D.   On: 02/14/2021 22:15   DG Chest Port 1 View  Result Date: 02/14/2021 CLINICAL DATA:  Sepsis, back pain EXAM: PORTABLE CHEST 1 VIEW COMPARISON:  02/05/2021 FINDINGS: Prior CABG. Cardiomegaly. Vascular congestion. No overt  edema, confluent opacities or effusions. No acute bony abnormality. IMPRESSION: Cardiomegaly, vascular congestion. Electronically Signed   By: KRolm BaptiseM.D.   On: 02/14/2021 19:58   VAS [place]OWER EXTREMITY VENOUS (DVT)  Result Date: 02/15/2021  Lower Venous DVT Study Indications: Fever of unknown origin.  Comparison Study: No previous exams Performing Technologist: JRogelia Rohrer Examination Guidelines: A complete evaluation includes B-mode imaging, spectral Doppler, color Doppler, and power Doppler as needed of all accessible portions of each vessel. Bilateral testing is considered an integral part of a complete examination. Limited examinations for reoccurring indications may be performed as noted. The reflux portion of the exam is performed with the patient in reverse Trendelenburg.  +---------+---------------+---------+-----------+----------+--------------+ RIGHT    CompressibilityPhasicitySpontaneityPropertiesThrombus Aging +---------+---------------+---------+-----------+----------+--------------+ CFV      Full           Yes      Yes                                 +---------+---------------+---------+-----------+----------+--------------+ SFJ      Full                                                        +---------+---------------+---------+-----------+----------+--------------+ FV Prox  Full           Yes      Yes                                 +---------+---------------+---------+-----------+----------+--------------+  FV Mid   Full           Yes      Yes                                 +---------+---------------+---------+-----------+----------+--------------+ FV DistalFull           Yes      Yes                                 +---------+---------------+---------+-----------+----------+--------------+ PFV      Full                                                        +---------+---------------+---------+-----------+----------+--------------+ POP       Full           Yes      Yes                                 +---------+---------------+---------+-----------+----------+--------------+ PTV      Full                                                        +---------+---------------+---------+-----------+----------+--------------+ PERO     Full                                                        +---------+---------------+---------+-----------+----------+--------------+   +---------+---------------+---------+-----------+----------+--------------+ LEFT     CompressibilityPhasicitySpontaneityPropertiesThrombus Aging +---------+---------------+---------+-----------+----------+--------------+ CFV      Full           Yes      Yes                                 +---------+---------------+---------+-----------+----------+--------------+ SFJ      Full                                                        +---------+---------------+---------+-----------+----------+--------------+ FV Prox  Full           Yes      Yes                                 +---------+---------------+---------+-----------+----------+--------------+ FV Mid   Full           Yes      Yes                                 +---------+---------------+---------+-----------+----------+--------------+ FV DistalFull  Yes      Yes                                 +---------+---------------+---------+-----------+----------+--------------+ PFV      Full                                                        +---------+---------------+---------+-----------+----------+--------------+ POP      Full           Yes      Yes                                 +---------+---------------+---------+-----------+----------+--------------+ PTV      Full                                                        +---------+---------------+---------+-----------+----------+--------------+ PERO     Full                                                         +---------+---------------+---------+-----------+----------+--------------+     Summary: BILATERAL: - No evidence of deep vein thrombosis seen in the lower extremities, bilaterally. - No evidence of superficial venous thrombosis in the lower extremities, bilaterally. -No evidence of popliteal cyst, bilaterally.   *See table(s) above for measurements and observations. Electronically signed by Harold Barban MD on 02/15/2021 at 9:25:19 PM.    Final    VAS [place] UPPER EXTREMITY VENOUS DUPLEX  Result Date: 02/15/2021 UPPER VENOUS STUDY  Indications: fever Comparison Study: no prior Performing Technologist: Abram Sander RVS  Examination Guidelines: A complete evaluation includes B-mode imaging, spectral Doppler, color Doppler, and power Doppler as needed of all accessible portions of each vessel. Bilateral testing is considered an integral part of a complete examination. Limited examinations for reoccurring indications may be performed as noted.  Right Findings: +----------+------------+---------+-----------+----------+-------+ RIGHT     CompressiblePhasicitySpontaneousPropertiesSummary +----------+------------+---------+-----------+----------+-------+ IJV           Full       Yes       Yes                      +----------+------------+---------+-----------+----------+-------+ Subclavian    Full       Yes       Yes                      +----------+------------+---------+-----------+----------+-------+ Axillary      Full       Yes       Yes                      +----------+------------+---------+-----------+----------+-------+ Brachial      Full       Yes       Yes                      +----------+------------+---------+-----------+----------+-------+  Radial        Full                                          +----------+------------+---------+-----------+----------+-------+ Ulnar         Full                                           +----------+------------+---------+-----------+----------+-------+ Cephalic      Full                                          +----------+------------+---------+-----------+----------+-------+ Basilic       Full                                          +----------+------------+---------+-----------+----------+-------+  Left Findings: +----------+------------+---------+-----------+----------+-------+ LEFT      CompressiblePhasicitySpontaneousPropertiesSummary +----------+------------+---------+-----------+----------+-------+ IJV           Full       Yes       Yes                      +----------+------------+---------+-----------+----------+-------+ Subclavian    Full       Yes       Yes                      +----------+------------+---------+-----------+----------+-------+ Axillary      Full       Yes       Yes                      +----------+------------+---------+-----------+----------+-------+ Brachial      Full       Yes       Yes                      +----------+------------+---------+-----------+----------+-------+ Radial        Full                                          +----------+------------+---------+-----------+----------+-------+ Ulnar         Full                                          +----------+------------+---------+-----------+----------+-------+ Cephalic      Full                                          +----------+------------+---------+-----------+----------+-------+ Basilic       Full                                          +----------+------------+---------+-----------+----------+-------+  Summary: No evidence of deep vein or  superficial vein thrombosis involving the right and left upper extremities.  *See table(s) above for measurements and observations.  Diagnosing physician: Harold Barban MD Electronically signed by Harold Barban MD on 02/15/2021 at 9:25:47 PM.    Final     Assessment & Plan:   There  are no diagnoses linked to this encounter.   No orders of the defined types were placed in this encounter.    Follow-up: No follow-ups on file.  Walker Kehr, MD

## 2021-04-04 NOTE — Assessment & Plan Note (Signed)
On Furosemide, Cartia BP OK at home

## 2021-04-04 NOTE — Addendum Note (Signed)
Addended by: Jacobo Forest on: 04/04/2021 10:48 AM   Modules accepted: Orders

## 2021-04-04 NOTE — Assessment & Plan Note (Signed)
?  MSK new - will watch Ice

## 2021-04-04 NOTE — Assessment & Plan Note (Signed)
On Coumadin  Compliance was encouraged.

## 2021-04-06 ENCOUNTER — Ambulatory Visit: Payer: Medicare Other

## 2021-04-17 ENCOUNTER — Other Ambulatory Visit: Payer: Self-pay

## 2021-04-17 ENCOUNTER — Ambulatory Visit: Payer: Medicare Other | Admitting: Pharmacist

## 2021-04-17 DIAGNOSIS — Z7901 Long term (current) use of anticoagulants: Secondary | ICD-10-CM | POA: Diagnosis not present

## 2021-04-17 DIAGNOSIS — Z9889 Other specified postprocedural states: Secondary | ICD-10-CM

## 2021-04-17 DIAGNOSIS — I4891 Unspecified atrial fibrillation: Secondary | ICD-10-CM | POA: Diagnosis not present

## 2021-04-17 LAB — POCT INR: INR: 3.1 — AB (ref 2.0–3.0)

## 2021-04-17 NOTE — Patient Instructions (Signed)
Continue taking warfarin 15mg  daily except for 10mg  on Monday, Wed and Friday -Recheck INR in 2 week. [facility] [phone].

## 2021-04-27 DIAGNOSIS — Z20822 Contact with and (suspected) exposure to covid-19: Secondary | ICD-10-CM | POA: Diagnosis not present

## 2021-04-27 DIAGNOSIS — Z03818 Encounter for observation for suspected exposure to other biological agents ruled out: Secondary | ICD-10-CM | POA: Diagnosis not present

## 2021-05-01 ENCOUNTER — Ambulatory Visit: Payer: Medicare Other | Admitting: Internal Medicine

## 2021-05-05 NOTE — Progress Notes (Signed)
Patient ID: Beth Miller, female   DOB: October 21, 1941, 80 y.o.   MRN: [id number]    79 y.o. seen for f/u MVR  Also has history of chronic afib, previous smoker with COPD, DM PVD with carotid disease No documented CAD last myovue non ischemic March 2014  TTE showed normal MV function and normal EF 05/10/20 Carotids With plaque no stenosis     11/17/20 had digoxin toxicity with level over 3 and junctional bigeminal rhythm Resolved after stopping   Post Monitor 12/12/20 shows afib rates 70's and no malignant ventricular beats or junctional rhythm  02/15/21 hospitalized with sepsis unknown source fever, abdominal pain diarrhea Afib rates elevated during Admission due to pain/fever d/c with Cardizem 240 mg and Toprol 50 mg W/U negative but patient left before TEE and PET scan to look for source of infection   TTE 02/10/21 EF 55% severe bi atrial enlargement  MVR St Jude mechanical mean gradient 4 trivial MR no obvious SBE moderate to severe TR   Compliance with coumadin seems better with last two INRls Rx  HR high in office 100 bpm She is not taking the 50 mg Toprol prescribed on d/c    ROS: Denies fever, malais, weight loss, blurry vision, decreased visual acuity, cough, sputum, SOB, hemoptysis, pleuritic pain, palpitaitons, heartburn, abdominal pain, melena, lower extremity edema, claudication, or rash.  All other systems reviewed and negative  General: Vitals:   05/14/21 1518  Weight: 77.7 kg  Height: 5\' 2"  (1.575 m)      Affect appropriate Healthy:  Black female  HEENT: normal Neck supple with no adenopathy JVP elevated no bruits no thyromegaly Lungs clear with no wheezing and good diaphragmatic motion Heart:  S1 click no MR murmur post sternotomy  Abdomen: benighn, BS positve, no tenderness, no AAA no bruit.  No HSM or HJR Distal pulses intact with no bruits No edema Neuro non-focal Skin warm and dry No muscular weakness   Current Outpatient Medications  Medication Sig  Dispense Refill   ACCU-CHEK AVIVA PLUS test strip TEST BLOOD SUGAR TWICE DAILY 200 strip 3   ACCU-CHEK SOFTCLIX LANCETS lancets 1 each by Other route 2 (two) times daily. Use to check blood sugars twice a day 200 each 3   acetaminophen (TYLENOL) 500 MG tablet Take 1,000 mg by mouth every 6 (six) hours as needed for mild pain.     Alcohol Swabs (B-D SINGLE USE SWABS REGULAR) PADS 1 each by Does not apply route 2 (two) times daily. Use to clean finger to check blood sugars twice a day 200 each 3   amoxicillin (AMOXIL) 500 MG tablet Take four capsules (2,000 mg) one hour prior to all dental visits. 8 tablet 11   Blood Glucose Calibration (ACCU-CHEK AVIVA) SOLN 1 each by In Vitro route as directed. 3 each 1   Cholecalciferol (VITAMIN D3) 1000 UNIT tablet Take 1,000 Units by mouth daily.     cyclobenzaprine (FLEXERIL) 10 MG tablet Take 0.5-1 tablets (5-10 mg total) by mouth 3 (three) times daily as needed for muscle spasms. 90 tablet 1   diltiazem (CARDIZEM CD) 240 MG 24 hr capsule Take 1 capsule (240 mg total) by mouth daily. 90 capsule 1   furosemide (LASIX) 20 MG tablet Take 1 tablet (20 mg total) by mouth daily. Take in am 90 tablet 3   gabapentin (NEURONTIN) 100 MG capsule Take 1 capsule (100 mg total) by mouth at bedtime. 90 capsule 3   Lancet Devices (ACCU-CHEK SOFTCLIX) lancets 1 each  by Other route 2 (two) times daily. Use as instructed 1 each 5   loratadine (CLARITIN) 10 MG tablet Take 1 tablet (10 mg total) by mouth daily as needed for allergies. Take in am 100 tablet 3   memantine (NAMENDA) 5 MG tablet Take 1 tablet (5 mg total) by mouth 2 (two) times daily. 180 tablet 3   metFORMIN (GLUCOPHAGE) 500 MG tablet Take 1 tablet (500 mg total) by mouth daily with breakfast. 90 tablet 3   metoprolol succinate (TOPROL XL) 25 MG 24 hr tablet Take 1 tablet (25 mg total) by mouth daily. 30 tablet 0   mirabegron ER (MYRBETRIQ) 25 MG TB24 tablet Take 1 tablet (25 mg total) by mouth daily. 90 tablet 3    oxyCODONE (OXY IR/ROXICODONE) 5 MG immediate release tablet Take 1 tablet (5 mg total) by mouth every 6 (six) hours as needed for severe pain. 20 tablet 0   pantoprazole (PROTONIX) 40 MG tablet Take 1 tablet (40 mg total) by mouth daily. Take in am 90 tablet 3   potassium chloride (KLOR-CON) 10 MEQ tablet Take 1 tablet (10 mEq total) by mouth daily. 90 tablet 3   triamcinolone cream (KENALOG) 0.5 % Apply 1 application topically 3 (three) times daily as needed. 60 g 1   vitamin B-12 (CYANOCOBALAMIN) 1000 MCG tablet Take 1,000 mcg by mouth daily.     warfarin (COUMADIN) 10 MG tablet Take 1 to 2 tablets daily or as directed by Coumadin clinic 90 tablet 0   warfarin (COUMADIN) 5 MG tablet As directed 30 tablet 3   No current facility-administered medications for this visit.    Allergies  Aricept [donepezil hcl], Food, Furosemide, Quinine, Spironolactone, Tramadol, and Sulfadiazine  Electrocardiogram:  05/14/2021 afib rate 116 otherwise normal   Assessment and Plan Afib:  ? Dig toxicity 11/17/20  regularized ? Junctional with paired PVC;s Now resolved off digoxin and toxicity confirmed With level  3.8 Back on cardizem but needs Toprol 50 mg daily script called in   HTN:  Well controlled.  Continue current medications and low sodium Dash type diet. Decrease lasix/K as azotemic When found to have dig toxicity  Carotid: Plaque on duplex  05/24/20 no stenosis observe   MVR:  Normal valve click  Echo reviewed 0/92/33 normal function EF normal   DM:  Discussed low carb diet.  Target hemoglobin A1c is 6.5 or less.  Continue current medications.  Anticoagulation: long discussion about compliance. No TIA/bleeding issues INR Rx this month   F/u with me  in 6 months    Jenkins Rouge

## 2021-05-07 ENCOUNTER — Ambulatory Visit: Payer: Medicare Other | Admitting: Internal Medicine

## 2021-05-08 ENCOUNTER — Other Ambulatory Visit: Payer: Self-pay

## 2021-05-08 ENCOUNTER — Encounter: Payer: Self-pay | Admitting: Internal Medicine

## 2021-05-08 ENCOUNTER — Ambulatory Visit (INDEPENDENT_AMBULATORY_CARE_PROVIDER_SITE_OTHER): Payer: Medicare Other | Admitting: Internal Medicine

## 2021-05-08 DIAGNOSIS — M898X1 Other specified disorders of bone, shoulder: Secondary | ICD-10-CM

## 2021-05-08 MED ORDER — CYCLOBENZAPRINE HCL 10 MG PO TABS: 5.0000 mg | ORAL_TABLET | Freq: Three times a day (TID) | ORAL | 1 refills | Status: DC | PRN

## 2021-05-08 NOTE — Progress Notes (Signed)
Subjective:  Patient ID: Beth Miller, female    DOB: 06-29-1941  Age: 80 y.o. MRN: [id number]  CC: Follow-up (3 month f/u)   HPI TEKESHIA KLAHR presents for R shoulder blade back pain - resolving. Pain is 0-1/10 Husband is (+) for COVID  Outpatient Medications Prior to Visit  Medication Sig Dispense Refill   ACCU-CHEK AVIVA PLUS test strip TEST BLOOD SUGAR TWICE DAILY 200 strip 3   ACCU-CHEK SOFTCLIX LANCETS lancets 1 each by Other route 2 (two) times daily. Use to check blood sugars twice a day 200 each 3   acetaminophen (TYLENOL) 500 MG tablet Take 1,000 mg by mouth every 6 (six) hours as needed for mild pain.     Alcohol Swabs (B-D SINGLE USE SWABS REGULAR) PADS 1 each by Does not apply route 2 (two) times daily. Use to clean finger to check blood sugars twice a day 200 each 3   amoxicillin (AMOXIL) 500 MG tablet Take four capsules (2,000 mg) one hour prior to all dental visits. 8 tablet 11   Blood Glucose Calibration (ACCU-CHEK AVIVA) SOLN 1 each by In Vitro route as directed. 3 each 1   Cholecalciferol (VITAMIN D3) 1000 UNIT tablet Take 1,000 Units by mouth daily.     cyclobenzaprine (FLEXERIL) 10 MG tablet Take 1 tablet (10 mg total) by mouth 3 (three) times daily as needed for muscle spasms. 30 tablet 0   diltiazem (CARDIZEM CD) 240 MG 24 hr capsule Take 1 capsule (240 mg total) by mouth daily. 90 capsule 1   furosemide (LASIX) 20 MG tablet Take 1 tablet (20 mg total) by mouth daily. Take in am 90 tablet 3   gabapentin (NEURONTIN) 100 MG capsule Take 1 capsule (100 mg total) by mouth at bedtime. 90 capsule 3   Lancet Devices (ACCU-CHEK SOFTCLIX) lancets 1 each by Other route 2 (two) times daily. Use as instructed 1 each 5   loratadine (CLARITIN) 10 MG tablet Take 1 tablet (10 mg total) by mouth daily as needed for allergies. Take in am 100 tablet 3   memantine (NAMENDA) 5 MG tablet Take 1 tablet (5 mg total) by mouth 2 (two) times daily. 180 tablet 3   metFORMIN (GLUCOPHAGE)  500 MG tablet Take 1 tablet (500 mg total) by mouth daily with breakfast. 90 tablet 3   metoprolol succinate (TOPROL XL) 25 MG 24 hr tablet Take 1 tablet (25 mg total) by mouth daily. 30 tablet 0   mirabegron ER (MYRBETRIQ) 25 MG TB24 tablet Take 1 tablet (25 mg total) by mouth daily. 90 tablet 3   oxyCODONE (OXY IR/ROXICODONE) 5 MG immediate release tablet Take 1 tablet (5 mg total) by mouth every 6 (six) hours as needed for severe pain. 20 tablet 0   pantoprazole (PROTONIX) 40 MG tablet Take 1 tablet (40 mg total) by mouth daily. Take in am 90 tablet 3   potassium chloride (KLOR-CON) 10 MEQ tablet Take 1 tablet (10 mEq total) by mouth daily. 90 tablet 3   triamcinolone cream (KENALOG) 0.5 % Apply 1 application topically 3 (three) times daily as needed. 60 g 1   vitamin B-12 (CYANOCOBALAMIN) 1000 MCG tablet Take 1,000 mcg by mouth daily.     warfarin (COUMADIN) 10 MG tablet Take 1 to 2 tablets daily or as directed by Coumadin clinic 90 tablet 0   warfarin (COUMADIN) 5 MG tablet As directed 30 tablet 3   Blood Glucose Monitoring Suppl (ACCU-CHEK AVIVA PLUS) w/Device KIT 1 each by Does  not apply route 2 (two) times daily. Use as directed 1 kit 0   No facility-administered medications prior to visit.    ROS: Review of Systems  Constitutional:  Negative for activity change, appetite change, chills, fatigue and unexpected weight change.  HENT:  Negative for congestion, mouth sores and sinus pressure.   Eyes:  Negative for visual disturbance.  Respiratory:  Negative for cough and chest tightness.   Gastrointestinal:  Negative for abdominal pain and nausea.  Genitourinary:  Negative for difficulty urinating, frequency and vaginal pain.  Musculoskeletal:  Negative for back pain and gait problem.  Skin:  Negative for pallor and rash.  Neurological:  Negative for dizziness, tremors, weakness, numbness and headaches.  Psychiatric/Behavioral:  Negative for confusion and sleep disturbance.     Objective:  BP 134/90 (BP Location: Left Arm)   Pulse (!) 108   Temp 98.1 F (36.7 C) (Oral)   Ht $R'5\' 2"'Li$  (1.575 m)   Wt 170 lb 6.4 oz (77.3 kg)   LMP  (LMP Unknown)   SpO2 97%   BMI 31.17 kg/m   BP Readings from Last 3 Encounters:  05/08/21 134/90  04/04/21 (!) 141/80  02/21/21 120/82    Wt Readings from Last 3 Encounters:  05/08/21 170 lb 6.4 oz (77.3 kg)  04/04/21 171 lb 6.4 oz (77.7 kg)  02/21/21 167 lb (75.8 kg)    Physical Exam Constitutional:      General: She is not in acute distress.    Appearance: She is well-developed.  HENT:     Head: Normocephalic.     Right Ear: External ear normal.     Left Ear: External ear normal.     Nose: Nose normal.  Eyes:     General:        Right eye: No discharge.        Left eye: No discharge.     Conjunctiva/sclera: Conjunctivae normal.     Pupils: Pupils are equal, round, and reactive to light.  Neck:     Thyroid: No thyromegaly.     Vascular: No JVD.     Trachea: No tracheal deviation.  Cardiovascular:     Rate and Rhythm: Normal rate and regular rhythm.     Heart sounds: Normal heart sounds.  Pulmonary:     Effort: No respiratory distress.     Breath sounds: No stridor. No wheezing.  Abdominal:     General: Bowel sounds are normal. There is no distension.     Palpations: Abdomen is soft. There is no mass.     Tenderness: There is no abdominal tenderness. There is no guarding or rebound.  Musculoskeletal:        General: No tenderness.     Cervical back: Normal range of motion and neck supple. No rigidity.  Lymphadenopathy:     Cervical: No cervical adenopathy.  Skin:    Findings: No erythema or rash.  Neurological:     Cranial Nerves: No cranial nerve deficit.     Motor: No abnormal muscle tone.     Coordination: Coordination normal.     Deep Tendon Reflexes: Reflexes normal.  Psychiatric:        Behavior: Behavior normal.        Thought Content: Thought content normal.        Judgment: Judgment  normal.   A residual sensitivity - R paraspinal muscle medial from R scapula  Lab Results  Component Value Date   WBC 5.1 04/04/2021   HGB 11.5 (  L) 04/04/2021   HCT 35.6 (L) 04/04/2021   PLT 343.0 04/04/2021   GLUCOSE 136 (H) 04/04/2021   CHOL 106 04/27/2019   TRIG 177.0 (H) 04/27/2019   HDL 37.40 (L) 04/27/2019   LDLCALC 33 04/27/2019   ALT 32 02/14/2021   AST 26 02/14/2021   NA 140 04/04/2021   K 4.3 04/04/2021   CL 100 04/04/2021   CREATININE 0.97 04/04/2021   BUN 17 04/04/2021   CO2 35 (H) 04/04/2021   TSH 3.56 01/30/2021   INR 3.1 (A) 04/17/2021   HGBA1C 6.9 (H) 04/04/2021    MR Lumbar Spine W Wo Contrast  Result Date: 02/14/2021 CLINICAL DATA:  Low back pain EXAM: MRI LUMBAR SPINE WITHOUT AND WITH CONTRAST TECHNIQUE: Multiplanar and multiecho pulse sequences of the lumbar spine were obtained without and with intravenous contrast. CONTRAST:  24mL GADAVIST GADOBUTROL 1 MMOL/ML IV SOLN COMPARISON:  Pain FINDINGS: Segmentation:  Standard. Alignment:  Grade 1 anterolisthesis at L4-5 Vertebrae:  No fracture, evidence of discitis, or bone lesion. Conus medullaris and cauda equina: Conus extends to the L1 level. Conus and cauda equina appear normal. Paraspinal and other soft tissues: Negative Disc levels: The disc levels above L4 are unremarkable. L4-5: Small disc bulge with severe facet hypertrophy. Severe spinal canal stenosis and severe left foraminal stenosis. L5-S1: Severe facet arthrosis and mild disc bulge. No spinal canal stenosis. Mild bilateral neural foraminal stenosis. IMPRESSION: 1. Severe spinal canal stenosis and left foraminal stenosis at L4-L5 due to combination of disc bulge and severe facet arthrosis. 2. Severe facet arthrosis at L5-S1 with mild bilateral neural foraminal stenosis. Electronically Signed   By: Ulyses Jarred M.D.   On: 02/14/2021 22:15   DG Chest Port 1 View  Result Date: 02/14/2021 CLINICAL DATA:  Sepsis, back pain EXAM: PORTABLE CHEST 1 VIEW  COMPARISON:  02/05/2021 FINDINGS: Prior CABG. Cardiomegaly. Vascular congestion. No overt edema, confluent opacities or effusions. No acute bony abnormality. IMPRESSION: Cardiomegaly, vascular congestion. Electronically Signed   By: Rolm Baptise M.D.   On: 02/14/2021 19:58   VAS [place] LOWER EXTREMITY VENOUS (DVT)  Result Date: 02/15/2021  Lower Venous DVT Study Indications: Fever of unknown origin.  Comparison Study: No previous exams Performing Technologist: Rogelia Rohrer  Examination Guidelines: A complete evaluation includes B-mode imaging, spectral Doppler, color Doppler, and power Doppler as needed of all accessible portions of each vessel. Bilateral testing is considered an integral part of a complete examination. Limited examinations for reoccurring indications may be performed as noted. The reflux portion of the exam is performed with the patient in reverse Trendelenburg.  +---------+---------------+---------+-----------+----------+--------------+ RIGHT    CompressibilityPhasicitySpontaneityPropertiesThrombus Aging +---------+---------------+---------+-----------+----------+--------------+ CFV      Full           Yes      Yes                                 +---------+---------------+---------+-----------+----------+--------------+ SFJ      Full                                                        +---------+---------------+---------+-----------+----------+--------------+ FV Prox  Full           Yes      Yes                                 +---------+---------------+---------+-----------+----------+--------------+  FV Mid   Full           Yes      Yes                                 +---------+---------------+---------+-----------+----------+--------------+ FV DistalFull           Yes      Yes                                 +---------+---------------+---------+-----------+----------+--------------+ PFV      Full                                                         +---------+---------------+---------+-----------+----------+--------------+ POP      Full           Yes      Yes                                 +---------+---------------+---------+-----------+----------+--------------+ PTV      Full                                                        +---------+---------------+---------+-----------+----------+--------------+ PERO     Full                                                        +---------+---------------+---------+-----------+----------+--------------+   +---------+---------------+---------+-----------+----------+--------------+ LEFT     CompressibilityPhasicitySpontaneityPropertiesThrombus Aging +---------+---------------+---------+-----------+----------+--------------+ CFV      Full           Yes      Yes                                 +---------+---------------+---------+-----------+----------+--------------+ SFJ      Full                                                        +---------+---------------+---------+-----------+----------+--------------+ FV Prox  Full           Yes      Yes                                 +---------+---------------+---------+-----------+----------+--------------+ FV Mid   Full           Yes      Yes                                 +---------+---------------+---------+-----------+----------+--------------+ FV DistalFull  Yes      Yes                                 +---------+---------------+---------+-----------+----------+--------------+ PFV      Full                                                        +---------+---------------+---------+-----------+----------+--------------+ POP      Full           Yes      Yes                                 +---------+---------------+---------+-----------+----------+--------------+ PTV      Full                                                         +---------+---------------+---------+-----------+----------+--------------+ PERO     Full                                                        +---------+---------------+---------+-----------+----------+--------------+     Summary: BILATERAL: - No evidence of deep vein thrombosis seen in the lower extremities, bilaterally. - No evidence of superficial venous thrombosis in the lower extremities, bilaterally. -No evidence of popliteal cyst, bilaterally.   *See table(s) above for measurements and observations. Electronically signed by Harold Barban MD on 02/15/2021 at 9:25:19 PM.    Final    VAS [place] UPPER EXTREMITY VENOUS DUPLEX  Result Date: 02/15/2021 UPPER VENOUS STUDY  Indications: fever Comparison Study: no prior Performing Technologist: Abram Sander RVS  Examination Guidelines: A complete evaluation includes B-mode imaging, spectral Doppler, color Doppler, and power Doppler as needed of all accessible portions of each vessel. Bilateral testing is considered an integral part of a complete examination. Limited examinations for reoccurring indications may be performed as noted.  Right Findings: +----------+------------+---------+-----------+----------+-------+ RIGHT     CompressiblePhasicitySpontaneousPropertiesSummary +----------+------------+---------+-----------+----------+-------+ IJV           Full       Yes       Yes                      +----------+------------+---------+-----------+----------+-------+ Subclavian    Full       Yes       Yes                      +----------+------------+---------+-----------+----------+-------+ Axillary      Full       Yes       Yes                      +----------+------------+---------+-----------+----------+-------+ Brachial      Full       Yes       Yes                      +----------+------------+---------+-----------+----------+-------+  Radial        Full                                           +----------+------------+---------+-----------+----------+-------+ Ulnar         Full                                          +----------+------------+---------+-----------+----------+-------+ Cephalic      Full                                          +----------+------------+---------+-----------+----------+-------+ Basilic       Full                                          +----------+------------+---------+-----------+----------+-------+  Left Findings: +----------+------------+---------+-----------+----------+-------+ LEFT      CompressiblePhasicitySpontaneousPropertiesSummary +----------+------------+---------+-----------+----------+-------+ IJV           Full       Yes       Yes                      +----------+------------+---------+-----------+----------+-------+ Subclavian    Full       Yes       Yes                      +----------+------------+---------+-----------+----------+-------+ Axillary      Full       Yes       Yes                      +----------+------------+---------+-----------+----------+-------+ Brachial      Full       Yes       Yes                      +----------+------------+---------+-----------+----------+-------+ Radial        Full                                          +----------+------------+---------+-----------+----------+-------+ Ulnar         Full                                          +----------+------------+---------+-----------+----------+-------+ Cephalic      Full                                          +----------+------------+---------+-----------+----------+-------+ Basilic       Full                                          +----------+------------+---------+-----------+----------+-------+  Summary: No evidence of deep vein or  superficial vein thrombosis involving the right and left upper extremities.  *See table(s) above for measurements and observations.  Diagnosing  physician: Harold Barban MD Electronically signed by Harold Barban MD on 02/15/2021 at 9:25:47 PM.    Final     Assessment & Plan:     Walker Kehr, MD

## 2021-05-08 NOTE — Assessment & Plan Note (Signed)
MSK, resolving Flexeril prn

## 2021-05-10 ENCOUNTER — Other Ambulatory Visit: Payer: Self-pay

## 2021-05-10 ENCOUNTER — Ambulatory Visit (INDEPENDENT_AMBULATORY_CARE_PROVIDER_SITE_OTHER): Payer: Medicare Other | Admitting: *Deleted

## 2021-05-10 DIAGNOSIS — I4891 Unspecified atrial fibrillation: Secondary | ICD-10-CM

## 2021-05-10 DIAGNOSIS — Z9889 Other specified postprocedural states: Secondary | ICD-10-CM | POA: Diagnosis not present

## 2021-05-10 DIAGNOSIS — I4821 Permanent atrial fibrillation: Secondary | ICD-10-CM | POA: Diagnosis not present

## 2021-05-10 DIAGNOSIS — Z7901 Long term (current) use of anticoagulants: Secondary | ICD-10-CM | POA: Diagnosis not present

## 2021-05-10 LAB — POCT INR: INR: 4.1 — AB (ref 2.0–3.0)

## 2021-05-10 NOTE — Patient Instructions (Signed)
Description   Do not take any Warfarin today then continue taking warfarin 15mg  daily except for 10mg  on Monday, Wednesday, and Friday. Recheck INR in 2 weeks. Coumadin Clinic [phone].

## 2021-05-14 ENCOUNTER — Ambulatory Visit: Payer: Medicare Other | Admitting: Cardiovascular Disease

## 2021-05-14 ENCOUNTER — Other Ambulatory Visit: Payer: Self-pay

## 2021-05-14 ENCOUNTER — Encounter: Payer: Self-pay | Admitting: Cardiovascular Disease

## 2021-05-14 VITALS — BP 102/70 | HR 108 | Ht 62.0 in | Wt 171.2 lb

## 2021-05-14 DIAGNOSIS — I4821 Permanent atrial fibrillation: Secondary | ICD-10-CM | POA: Diagnosis not present

## 2021-05-14 DIAGNOSIS — I10 Essential (primary) hypertension: Secondary | ICD-10-CM

## 2021-05-14 DIAGNOSIS — Z952 Presence of prosthetic heart valve: Secondary | ICD-10-CM | POA: Diagnosis not present

## 2021-05-14 DIAGNOSIS — Z7901 Long term (current) use of anticoagulants: Secondary | ICD-10-CM | POA: Diagnosis not present

## 2021-05-14 MED ORDER — METOPROLOL SUCCINATE ER 50 MG PO TB24: 50.0000 mg | ORAL_TABLET | Freq: Every day | ORAL | 3 refills | Status: DC

## 2021-05-14 NOTE — Patient Instructions (Addendum)
Medication Instructions:  Your physician has recommended you make the following change in your medication:  1-Increase Metoprolol 50 mg by mouth daily.   *If you need a refill on your cardiac medications before your next appointment, please call your pharmacy*  Lab Work: If you have labs (blood work) drawn today and your tests are completely normal, you will receive your results only by: [place] (if you have MyChart) OR A paper copy in the mail If you have any lab test that is abnormal or we need to change your treatment, we will call you to review the results.  Testing/Procedures: None ordered today.  Follow-Up: At [facility], you and your health needs are our priority.  As part of our continuing mission to provide you with exceptional heart care, we have created designated Provider Care Teams.  These Care Teams include your primary Cardiologist (physician) and Advanced Practice Providers (APPs -  Physician Assistants and Nurse Practitioners) who all work together to provide you with the care you need, when you need it.  We recommend signing up for the patient portal called "MyChart".  Sign up information is provided on this After Visit Summary.  MyChart is used to connect with patients for Virtual Visits (Telemedicine).  Patients are able to view lab/test results, encounter notes, upcoming appointments, etc.  Non-urgent messages can be sent to your provider as well.   To learn more about what you can do with MyChart, go to [email or web address].    Your next appointment:   6 month(s)  The format for your next appointment:   In Person  Provider:   You may see Jenkins Rouge, MD or one of the following Advanced Practice Providers on your designated Care Team:   Cecilie Kicks, NP

## 2021-05-28 ENCOUNTER — Telehealth: Payer: Self-pay | Admitting: Internal Medicine

## 2021-05-28 NOTE — Telephone Encounter (Signed)
Beth Miller from [facility] calling in on behalf of patient for rx: cyclobenzaprine (FLEXERIL) 10 MG tablet sent to [facility]  ([place] [address] [place], [place] on 05/08/21   Patient is saying she never received med order delivery to home.  Please send new rx to pharmacy:  [facility] [place] [address].  Phone:  [phone] Fax:  [phone]

## 2021-05-29 MED ORDER — CYCLOBENZAPRINE HCL 10 MG PO TABS: 5.0000 mg | ORAL_TABLET | Freq: Three times a day (TID) | ORAL | 1 refills | Status: DC | PRN

## 2021-05-29 NOTE — Telephone Encounter (Signed)
Resent rx to [facility].Marland KitchenJohny Chess

## 2021-06-04 ENCOUNTER — Ambulatory Visit (INDEPENDENT_AMBULATORY_CARE_PROVIDER_SITE_OTHER): Payer: Medicare (Managed Care)

## 2021-06-04 ENCOUNTER — Other Ambulatory Visit: Payer: Self-pay

## 2021-06-04 DIAGNOSIS — I4821 Permanent atrial fibrillation: Secondary | ICD-10-CM | POA: Diagnosis not present

## 2021-06-04 DIAGNOSIS — I4891 Unspecified atrial fibrillation: Secondary | ICD-10-CM

## 2021-06-04 DIAGNOSIS — Z7901 Long term (current) use of anticoagulants: Secondary | ICD-10-CM

## 2021-06-04 DIAGNOSIS — Z9889 Other specified postprocedural states: Secondary | ICD-10-CM | POA: Diagnosis not present

## 2021-06-04 LAB — POCT INR: INR: 2.2 (ref 2.0–3.0)

## 2021-06-04 NOTE — Patient Instructions (Signed)
-   Take 15 mg warfarin tonight, ,  - continue taking warfarin '15mg'$  daily except for '10mg'$  on Monday, Wednesday, and Friday.  - Recheck INR in 2 weeks.   Coumadin Clinic [phone].

## 2021-06-08 ENCOUNTER — Telehealth: Payer: Self-pay | Admitting: Internal Medicine

## 2021-06-08 NOTE — Chronic Care Management (AMB) (Signed)
  Chronic Care Management   Note  06/08/2021 Name: ABEGAYLE CURENTON MRN: [id number] DOB: August 03, 1941  Francene Boyers Sobh is a 80 y.o. year old female who is a primary care patient of Plotnikov, Evie Lacks, MD. I reached out to Patrina Levering by phone today in response to a referral sent by Ms. Francene Boyers Heidemann's PCP, Plotnikov, Evie Lacks, MD.   Ms. Budzinski was given information about Chronic Care Management services today including:  CCM service includes personalized support from designated clinical staff supervised by her physician, including individualized plan of care and coordination with other care providers 24/7 contact phone numbers for assistance for urgent and routine care needs. Service will only be billed when office clinical staff spend 20 minutes or more in a month to coordinate care. Only one practitioner may furnish and bill the service in a calendar month. The patient may stop CCM services at any time (effective at the end of the month) by phone call to the office staff.   Patient agreed to services and verbal consent obtained.   Follow up plan:   Tatjana Secretary/administrator

## 2021-06-26 ENCOUNTER — Other Ambulatory Visit: Payer: Self-pay

## 2021-06-26 ENCOUNTER — Ambulatory Visit (INDEPENDENT_AMBULATORY_CARE_PROVIDER_SITE_OTHER): Payer: Medicare (Managed Care)

## 2021-06-26 DIAGNOSIS — I4891 Unspecified atrial fibrillation: Secondary | ICD-10-CM

## 2021-06-26 DIAGNOSIS — Z7901 Long term (current) use of anticoagulants: Secondary | ICD-10-CM

## 2021-06-26 DIAGNOSIS — Z5181 Encounter for therapeutic drug level monitoring: Secondary | ICD-10-CM | POA: Diagnosis not present

## 2021-06-26 LAB — POCT INR: INR: 5.4 — AB (ref 2.0–3.0)

## 2021-06-26 NOTE — Patient Instructions (Signed)
Description   Hold today's dose and tomorrow's dose and then continue taking warfarin '15mg'$  daily except for '10mg'$  on Monday, Wednesday, and Friday.  - Recheck INR in 1 week.   [facility] [phone].

## 2021-07-03 ENCOUNTER — Other Ambulatory Visit: Payer: Self-pay

## 2021-07-03 ENCOUNTER — Telehealth: Payer: Self-pay

## 2021-07-03 ENCOUNTER — Ambulatory Visit (INDEPENDENT_AMBULATORY_CARE_PROVIDER_SITE_OTHER): Payer: Medicare (Managed Care)

## 2021-07-03 DIAGNOSIS — I4891 Unspecified atrial fibrillation: Secondary | ICD-10-CM | POA: Diagnosis not present

## 2021-07-03 DIAGNOSIS — Z5181 Encounter for therapeutic drug level monitoring: Secondary | ICD-10-CM

## 2021-07-03 DIAGNOSIS — Z7901 Long term (current) use of anticoagulants: Secondary | ICD-10-CM

## 2021-07-03 LAB — POCT INR: INR: 1.7 — AB (ref 2.0–3.0)

## 2021-07-03 NOTE — Patient Instructions (Signed)
Description   Take '20mg'$  today and '15mg'$  tomorrow and then continue taking warfarin '15mg'$  daily except for '10mg'$  on Monday, Wednesday, and Friday.  - Recheck INR in 1 week.   [facility] [phone].

## 2021-07-03 NOTE — Progress Notes (Signed)
Chronic Care Management Pharmacy Assistant   Name: Beth Miller  MRN: [id number] DOB: 1940/12/08  Beth Miller is an 80 y.o. year old female who presents for his initial CCM visit with the clinical pharmacist.   Recent office visits:  05/08/21 Cassandria Anger MD - Seen for follow up of pain of right scapula -  started cyclobenzaprine 0.5-1 tablets (5-10 mg total) by mouth 3 (three) times daily as needed for muscle spasms - Follow up in 3 months 04/27/21 - Office visit for Covid 19 exposure test - Results are negative  04/04/21 Evie Lacks Plotnikov MD - Follow up for general assessment - Labs ordered - No medication changes noted - Follow up in 3 months.   02/21/21 Evie Lacks Plotnikov MD - Seen for fever - No medication changes notes - Follow up in 6  weeks  01/30/21 Evie Lacks Plotnikov MD - Seen for General follow up - Labs ordered - No medication changes noted - Follow up in 3 months   Recent consult visits:  05/14/21 Josue Hector MD - Seen for permanent atrial fibrillation -Increase Metoprolol 50 mg by mouth daily - Follow up in 6 months.  01/15/21 - [facility] - Cardiology - No notes available   Hospital visits:  Medication Reconciliation was completed by comparing discharge summary, patient's EMR and Pharmacy list, and upon discussion with patient.  Admitted to the hospital on 02/14/21 due to Atrial fibrillation W/ rapid ventricular response. Discharge date was 02/15/21. Discharged from [place]?Medications Started at [facility] Discharge:?? N/a  Medication Changes at Hospital Discharge: N/a  Medications Discontinued at Hospital Discharge: N/a  Medications that remain the same after Hospital Discharge:??  -All other medications will remain the same.     Admitted to the hospital on 02/05/21 due to Atrial fibrillation with RVR . Discharge date was 02/12/21. Discharged from [place]?Medications Started at [facility]  Discharge:??  cyclobenzaprine (FLEXERIL) Take 1 tablet (10 mg total) by mouth 3 (three) times daily as needed for muscle spasms. metoprolol succinate (Toprol XL) Take 1 tablet (25 mg total) by mouth daily. oxyCODONE (Oxy IR/ROXICODONE) Take 1 tablet (5 mg total) by mouth every 6 (six) hours as needed for severe pain.  Medication Changes at Hospital Discharge: Cartia XT 240 MG 24 hr capsule Take 1 capsule (240 mg total) by mouth daily. Warfarin changes to 10 mg daily.   Medications Discontinued at Hospital Discharge: N/a  Medications: Outpatient Encounter Medications as of 07/03/2021  Medication Sig   ACCU-CHEK AVIVA PLUS test strip TEST BLOOD SUGAR TWICE DAILY   ACCU-CHEK SOFTCLIX LANCETS lancets 1 each by Other route 2 (two) times daily. Use to check blood sugars twice a day   acetaminophen (TYLENOL) 500 MG tablet Take 1,000 mg by mouth every 6 (six) hours as needed for mild pain.   Alcohol Swabs (B-D SINGLE USE SWABS REGULAR) PADS 1 each by Does not apply route 2 (two) times daily. Use to clean finger to check blood sugars twice a day   amoxicillin (AMOXIL) 500 MG tablet Take four capsules (2,000 mg) one hour prior to all dental visits.   Blood Glucose Calibration (ACCU-CHEK AVIVA) SOLN 1 each by In Vitro route as directed.   Cholecalciferol (VITAMIN D3) 1000 UNIT tablet Take 1,000 Units by mouth daily.   cyclobenzaprine (FLEXERIL) 10 MG tablet Take 0.5-1 tablets (5-10 mg total) by mouth 3 (three) times daily as needed for muscle spasms.  diltiazem (CARDIZEM CD) 240 MG 24 hr capsule Take 1 capsule (240 mg total) by mouth daily.   furosemide (LASIX) 20 MG tablet Take 1 tablet (20 mg total) by mouth daily. Take in am   gabapentin (NEURONTIN) 100 MG capsule Take 1 capsule (100 mg total) by mouth at bedtime.   Lancet Devices (ACCU-CHEK SOFTCLIX) lancets 1 each by Other route 2 (two) times daily. Use as instructed   loratadine (CLARITIN) 10 MG tablet Take 1 tablet (10 mg total) by mouth daily  as needed for allergies. Take in am   memantine (NAMENDA) 5 MG tablet Take 1 tablet (5 mg total) by mouth 2 (two) times daily.   metFORMIN (GLUCOPHAGE) 500 MG tablet Take 1 tablet (500 mg total) by mouth daily with breakfast.   metoprolol succinate (TOPROL XL) 50 MG 24 hr tablet Take 1 tablet (50 mg total) by mouth daily.   mirabegron ER (MYRBETRIQ) 25 MG TB24 tablet Take 1 tablet (25 mg total) by mouth daily.   oxyCODONE (OXY IR/ROXICODONE) 5 MG immediate release tablet Take 1 tablet (5 mg total) by mouth every 6 (six) hours as needed for severe pain.   pantoprazole (PROTONIX) 40 MG tablet Take 1 tablet (40 mg total) by mouth daily. Take in am   potassium chloride (KLOR-CON) 10 MEQ tablet Take 1 tablet (10 mEq total) by mouth daily.   triamcinolone cream (KENALOG) 0.5 % Apply 1 application topically 3 (three) times daily as needed.   vitamin B-12 (CYANOCOBALAMIN) 1000 MCG tablet Take 1,000 mcg by mouth daily.   warfarin (COUMADIN) 10 MG tablet Take 1 to 2 tablets daily or as directed by Coumadin clinic   warfarin (COUMADIN) 5 MG tablet As directed   No facility-administered encounter medications on file as of 07/03/2021.   acetaminophen (TYLENOL) 500 MG tablet - Last filled unknown  Cholecalciferol (VITAMIN D3) 1000 UNIT tablet - Last filled unknown  cyclobenzaprine (FLEXERIL) 10 MG tablet - Last filled  05/29/21 30 DS diltiazem (CARDIZEM CD) 240 MG 24 hr capsule - Last filled 03/16/21 90 DS furosemide (LASIX) 20 MG tablet - Last filled 11/23/20 90 DS gabapentin (NEURONTIN) 100 MG capsule - Last filled 05/28/21 90 DS memantine (NAMENDA) 5 MG tablet - Last filled 01/23/21 90 DS metFORMIN (GLUCOPHAGE) 500 MG tablet - Last filled 05/28/21 90 DS metoprolol succinate (TOPROL XL) 25 MG 24 hr tablet - Last filled 02/12/21 30 DS mirabegron ER (MYRBETRIQ) 25 MG TB24 tablet - Last filled 05/30/21 30 DS oxyCODONE (OXY IR/ROXICODONE) 5 MG immediate release tablet - Last filled  03/02/21 5 DS pantoprazole (PROTONIX)  40 MG tablet - Last filled - 01/23/21 90 DS potassium chloride (KLOR-CON) 10 MEQ tablet - Last filled  11/23/20 90 DS vitamin B-12 (CYANOCOBALAMIN) 1000 MCG tablet - Last filled unknown warfarin (COUMADIN) 5 MG tablet - Last filled 04/04/21 90 DS   [facility]: metFORMIN (GLUCOPHAGE) 500 MG tablet - Last filled 05/28/21 90 DS   Andee Poles, CMA

## 2021-07-05 ENCOUNTER — Ambulatory Visit: Payer: Medicare (Managed Care)

## 2021-07-10 ENCOUNTER — Telehealth: Payer: Self-pay | Admitting: Internal Medicine

## 2021-07-11 ENCOUNTER — Other Ambulatory Visit: Payer: Self-pay

## 2021-07-11 ENCOUNTER — Ambulatory Visit (INDEPENDENT_AMBULATORY_CARE_PROVIDER_SITE_OTHER): Payer: Medicare (Managed Care)

## 2021-07-11 DIAGNOSIS — Z7901 Long term (current) use of anticoagulants: Secondary | ICD-10-CM

## 2021-07-11 DIAGNOSIS — Z9889 Other specified postprocedural states: Secondary | ICD-10-CM

## 2021-07-11 DIAGNOSIS — I4891 Unspecified atrial fibrillation: Secondary | ICD-10-CM

## 2021-07-11 LAB — POCT INR: INR: 4.4 — AB (ref 2.0–3.0)

## 2021-07-11 NOTE — Patient Instructions (Signed)
Description   Skip today's dosage of Warfarin, then resume same dosage of warfarin '15mg'$  daily except for '10mg'$  on Mondays, Wednesdays, and Fridays.  Recheck INR in 10 days.  [facility] Clinic [phone].

## 2021-07-17 NOTE — Telephone Encounter (Signed)
Rec'd determination fax MD was approved. Starting 07/17/21- 07/17/22. Faxed approval to pof.Marland KitchenJohny Chess

## 2021-07-17 NOTE — Telephone Encounter (Signed)
Submitted PA via cover-my-meds w/ (Key: [id number]). Waiting on insurance determination.Marland KitchenJohny Chess

## 2021-07-24 ENCOUNTER — Telehealth: Payer: Self-pay | Admitting: *Deleted

## 2021-07-24 NOTE — Telephone Encounter (Signed)
Called pt since she missed her appt today (missed yesterday appt & was rescheduled); there was no answer so left a message for her to call back.

## 2021-07-31 ENCOUNTER — Telehealth: Payer: Self-pay

## 2021-07-31 NOTE — Chronic Care Management (AMB) (Unsigned)
Chronic Care Management Pharmacy Assistant   Name: Beth Miller  MRN: [id number] DOB: 12/31/1940  Beth Miller is an 80 y.o. year old female who presents for her initial CCM visit with the clinical pharmacist.  Recent office visits:  ***  Recent consult visits:  [facility] visits:  {Hospital DC Yes/No:[id number]}  Medications: Outpatient Encounter Medications as of 07/31/2021  Medication Sig   ACCU-CHEK AVIVA PLUS test strip TEST BLOOD SUGAR TWICE DAILY   ACCU-CHEK SOFTCLIX LANCETS lancets 1 each by Other route 2 (two) times daily. Use to check blood sugars twice a day   acetaminophen (TYLENOL) 500 MG tablet Take 1,000 mg by mouth every 6 (six) hours as needed for mild pain.   Alcohol Swabs (B-D SINGLE USE SWABS REGULAR) PADS 1 each by Does not apply route 2 (two) times daily. Use to clean finger to check blood sugars twice a day   amoxicillin (AMOXIL) 500 MG tablet Take four capsules (2,000 mg) one hour prior to all dental visits.   Blood Glucose Calibration (ACCU-CHEK AVIVA) SOLN 1 each by In Vitro route as directed.   Cholecalciferol (VITAMIN D3) 1000 UNIT tablet Take 1,000 Units by mouth daily.   cyclobenzaprine (FLEXERIL) 10 MG tablet take 1/2 to 1 TABLET BY MOUTH THREE TIMES DAILY AS NEEDED FOR MUSCLE SPASMS   diltiazem (CARDIZEM CD) 240 MG 24 hr capsule TAKE ONE CAPSULE BY MOUTH EVERY DAY   furosemide (LASIX) 20 MG tablet Take 1 tablet (20 mg total) by mouth daily. Take in am   gabapentin (NEURONTIN) 100 MG capsule Take 1 capsule (100 mg total) by mouth at bedtime.   Lancet Devices (ACCU-CHEK SOFTCLIX) lancets 1 each by Other route 2 (two) times daily. Use as instructed   loratadine (CLARITIN) 10 MG tablet Take 1 tablet (10 mg total) by mouth daily as needed for allergies. Take in am   memantine (NAMENDA) 5 MG tablet Take 1 tablet (5 mg total) by mouth 2 (two) times daily.   metFORMIN (GLUCOPHAGE) 500 MG tablet Take 1 tablet (500 mg total) by mouth daily with  breakfast.   metoprolol succinate (TOPROL XL) 50 MG 24 hr tablet Take 1 tablet (50 mg total) by mouth daily.   mirabegron ER (MYRBETRIQ) 25 MG TB24 tablet Take 1 tablet (25 mg total) by mouth daily.   oxyCODONE (OXY IR/ROXICODONE) 5 MG immediate release tablet Take 1 tablet (5 mg total) by mouth every 6 (six) hours as needed for severe pain.   pantoprazole (PROTONIX) 40 MG tablet Take 1 tablet (40 mg total) by mouth daily. Take in am   potassium chloride (KLOR-CON) 10 MEQ tablet Take 1 tablet (10 mEq total) by mouth daily.   triamcinolone cream (KENALOG) 0.5 % Apply 1 application topically 3 (three) times daily as needed.   vitamin B-12 (CYANOCOBALAMIN) 1000 MCG tablet Take 1,000 mcg by mouth daily.   warfarin (COUMADIN) 10 MG tablet Take 1 to 2 tablets daily or as directed by Coumadin clinic   warfarin (COUMADIN) 5 MG tablet As directed   No facility-administered encounter medications on file as of 07/31/2021.    Current Medication List  acetaminophen (TYLENOL) 500 MG tablet amoxicillin (AMOXIL) 500 MG tablet last filled Cholecalciferol (VITAMIN D3) 1000 UNIT tablet cyclobenzaprine (FLEXERIL) 10 MG tablet last filled diltiazem (CARDIZEM CD) 240 MG 24 hr capsule last filled furosemide (LASIX) 20 MG tablet last filled gabapentin (NEURONTIN) 100 MG capsule last filled loratadine (CLARITIN) 10 MG tablet last filled memantine (NAMENDA) 5 MG  tablet last filled metFORMIN (GLUCOPHAGE) 500 MG tablet last filled metoprolol succinate (TOPROL XL) 50 MG 24 hr tablet last filled mirabegron ER (MYRBETRIQ) 25 MG TB24 tablet last filled oxyCODONE (OXY IR/ROXICODONE) 5 MG last filled pantoprazole (PROTONIX) 40 MG tablet last filled potassium chloride (KLOR-CON) 10 MEQ tablet last filled triamcinolone cream (KENALOG) 0.5 % last filled vitamin B-12 1000 MCG tablet warfarin (COUMADIN) 10 MG tablet last filled warfarin (COUMADIN) 5 MG tablet  [place] Clinical Pharmacist  Assistant [phone]

## 2021-08-07 ENCOUNTER — Ambulatory Visit (INDEPENDENT_AMBULATORY_CARE_PROVIDER_SITE_OTHER): Payer: Medicare (Managed Care)

## 2021-08-07 ENCOUNTER — Other Ambulatory Visit: Payer: Self-pay

## 2021-08-07 DIAGNOSIS — E1122 Type 2 diabetes mellitus with diabetic chronic kidney disease: Secondary | ICD-10-CM

## 2021-08-07 DIAGNOSIS — I4821 Permanent atrial fibrillation: Secondary | ICD-10-CM

## 2021-08-07 DIAGNOSIS — I10 Essential (primary) hypertension: Secondary | ICD-10-CM

## 2021-08-07 DIAGNOSIS — N1831 Chronic kidney disease, stage 3a: Secondary | ICD-10-CM

## 2021-08-07 NOTE — Patient Instructions (Signed)
Beth Miller,  It was great to talk to you today!  Please call me with any questions or concerns.   Visit Information    Consent to CCM Services: [name] was given information about Chronic Care Management services including:  CCM service includes personalized support from designated clinical staff supervised by her physician, including individualized plan of care and coordination with other care providers 24/7 contact phone numbers for assistance for urgent and routine care needs. Service will only be billed when office clinical staff spend 20 minutes or more in a month to coordinate care. Only one practitioner may furnish and bill the service in a calendar month. The patient may stop CCM services at any time (effective at the end of the month) by phone call to the office staff. The patient will be responsible for cost sharing (co-pay) of up to 20% of the service fee (after annual deductible is met).  Patient declined CCM follow up, aware that she can reach out to clinic if she would like to schedule additional appointment in the future   The patient verbalized understanding of instructions, educational materials, and care plan provided today and declined offer to receive copy of patient instructions, educational materials, and care plan.   CCM enrollment status changed to "previously enrolled" as per patient request on 08/07/2021 to discontinue enrollment. Case closed to case management services in primary care home.   Tomasa Blase, PharmD Clinical Pharmacist, [place]
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
## 2021-08-09 ENCOUNTER — Telehealth: Payer: Self-pay | Admitting: *Deleted

## 2021-08-09 NOTE — Telephone Encounter (Signed)
Pt is overdue for an INR check. Called pt unable to get in touch with her.

## 2021-08-23 ENCOUNTER — Ambulatory Visit (INDEPENDENT_AMBULATORY_CARE_PROVIDER_SITE_OTHER): Payer: Medicare (Managed Care) | Admitting: *Deleted

## 2021-08-23 ENCOUNTER — Other Ambulatory Visit: Payer: Self-pay

## 2021-08-23 DIAGNOSIS — I4891 Unspecified atrial fibrillation: Secondary | ICD-10-CM | POA: Diagnosis not present

## 2021-08-23 DIAGNOSIS — Z7901 Long term (current) use of anticoagulants: Secondary | ICD-10-CM

## 2021-08-23 DIAGNOSIS — Z9889 Other specified postprocedural states: Secondary | ICD-10-CM

## 2021-08-23 LAB — POCT INR: INR: 2.1 (ref 2.0–3.0)

## 2021-08-23 NOTE — Patient Instructions (Signed)
Description   Today take 20mg  then continue taking Warfarin 15mg  daily except for 10mg  on Mondays, Wednesdays, and Fridays.  Recheck INR in 3 weeks.  Coumadin Clinic [phone].

## 2021-08-27 DIAGNOSIS — I4821 Permanent atrial fibrillation: Secondary | ICD-10-CM

## 2021-08-27 DIAGNOSIS — N1831 Chronic kidney disease, stage 3a: Secondary | ICD-10-CM | POA: Diagnosis not present

## 2021-08-27 DIAGNOSIS — E1122 Type 2 diabetes mellitus with diabetic chronic kidney disease: Secondary | ICD-10-CM | POA: Diagnosis not present

## 2021-08-27 DIAGNOSIS — I10 Essential (primary) hypertension: Secondary | ICD-10-CM

## 2021-09-12 ENCOUNTER — Telehealth: Payer: Self-pay | Admitting: Internal Medicine

## 2021-09-12 NOTE — Telephone Encounter (Signed)
Patient states her insurance has switched to [place] now. Per pt the pharmacy is requesting a list of her current prescriptions to add to her file. Requesting [place] to fax the list to [phone].   Please advise.

## 2021-09-14 MED ORDER — POTASSIUM CHLORIDE CRYS ER 10 MEQ PO TBCR: 10.0000 meq | EXTENDED_RELEASE_TABLET | Freq: Every day | ORAL | 3 refills | Status: DC

## 2021-09-14 MED ORDER — METFORMIN HCL 500 MG PO TABS: 500.0000 mg | ORAL_TABLET | Freq: Every day | ORAL | 3 refills | Status: DC

## 2021-09-14 MED ORDER — MIRABEGRON ER 25 MG PO TB24: 25.0000 mg | ORAL_TABLET | Freq: Every day | ORAL | 3 refills | Status: DC

## 2021-09-14 MED ORDER — VITAMIN B-12 1000 MCG PO TABS: 1000.0000 ug | ORAL_TABLET | Freq: Every day | ORAL | 3 refills | Status: AC

## 2021-09-14 MED ORDER — ACCU-CHEK AVIVA PLUS VI STRP: ORAL_STRIP | 3 refills | Status: DC

## 2021-09-14 MED ORDER — GABAPENTIN 100 MG PO CAPS
100.0000 mg | ORAL_CAPSULE | Freq: Every day | ORAL | 3 refills | Status: DC
Start: 2021-09-14 — End: 2022-02-06

## 2021-09-14 MED ORDER — CYCLOBENZAPRINE HCL 10 MG PO TABS: ORAL_TABLET | ORAL | 1 refills | Status: AC

## 2021-09-14 MED ORDER — FUROSEMIDE 20 MG PO TABS: 20.0000 mg | ORAL_TABLET | Freq: Every day | ORAL | 3 refills | Status: DC

## 2021-09-14 MED ORDER — ACCU-CHEK SOFTCLIX LANCETS MISC: 1.0000 | Freq: Two times a day (BID) | 3 refills | Status: AC

## 2021-09-14 MED ORDER — MEMANTINE HCL 5 MG PO TABS: 5.0000 mg | ORAL_TABLET | Freq: Two times a day (BID) | ORAL | 3 refills | Status: DC

## 2021-09-14 MED ORDER — PANTOPRAZOLE SODIUM 40 MG PO TBEC: 40.0000 mg | DELAYED_RELEASE_TABLET | Freq: Every day | ORAL | 3 refills | Status: DC

## 2021-09-14 MED ORDER — BD SWAB SINGLE USE REGULAR PADS: 1.0000 | MEDICATED_PAD | Freq: Two times a day (BID) | 3 refills | Status: AC

## 2021-09-14 MED ORDER — DILTIAZEM HCL ER COATED BEADS 240 MG PO CP24: 240.0000 mg | ORAL_CAPSULE | Freq: Every day | ORAL | 3 refills | Status: DC

## 2021-09-14 NOTE — Telephone Encounter (Signed)
Sent all  PCP maintenance [facility]s [facility].Marland KitchenJohny Chess

## 2021-09-18 ENCOUNTER — Other Ambulatory Visit: Payer: Self-pay | Admitting: Internal Medicine

## 2021-10-05 ENCOUNTER — Other Ambulatory Visit: Payer: Self-pay | Admitting: Internal Medicine

## 2021-10-05 NOTE — Telephone Encounter (Signed)
Pt see cardiology for coumadin. Forwarding to coumadin clinic cardiology.Marland KitchenJohny Miller

## 2021-10-05 NOTE — Telephone Encounter (Signed)
Prescription refill request received for warfarin [place]: 05/14/21 Beth Miller)  Next INR check: 10/08/21 Warfarin tablet strength: 10mg   Appropriate dose and refill sent to requested pharmacy.

## 2021-10-08 ENCOUNTER — Ambulatory Visit (INDEPENDENT_AMBULATORY_CARE_PROVIDER_SITE_OTHER): Payer: Medicare (Managed Care) | Admitting: *Deleted

## 2021-10-08 ENCOUNTER — Other Ambulatory Visit: Payer: Self-pay

## 2021-10-08 DIAGNOSIS — Z9889 Other specified postprocedural states: Secondary | ICD-10-CM | POA: Diagnosis not present

## 2021-10-08 DIAGNOSIS — I4891 Unspecified atrial fibrillation: Secondary | ICD-10-CM

## 2021-10-08 DIAGNOSIS — Z7901 Long term (current) use of anticoagulants: Secondary | ICD-10-CM | POA: Diagnosis not present

## 2021-10-08 LAB — POCT INR: INR: 4.7 — AB (ref 2.0–3.0)

## 2021-10-08 NOTE — Patient Instructions (Addendum)
Description   Do not take any Warfarin today and take 10mg  tomorrow then continue taking Warfarin 15mg  daily except for 10mg  on Mondays, Wednesdays, and Fridays. Recheck INR in 3 weeks. Coumadin Clinic [phone].

## 2021-11-01 ENCOUNTER — Other Ambulatory Visit: Payer: Self-pay

## 2021-11-01 ENCOUNTER — Ambulatory Visit (INDEPENDENT_AMBULATORY_CARE_PROVIDER_SITE_OTHER): Payer: Medicare HMO

## 2021-11-01 ENCOUNTER — Telehealth: Payer: Self-pay | Admitting: Cardiovascular Disease

## 2021-11-01 DIAGNOSIS — I4891 Unspecified atrial fibrillation: Secondary | ICD-10-CM | POA: Diagnosis not present

## 2021-11-01 DIAGNOSIS — Z7901 Long term (current) use of anticoagulants: Secondary | ICD-10-CM

## 2021-11-01 LAB — POCT INR: INR: 5 — AB (ref 2.0–3.0)

## 2021-11-01 NOTE — Telephone Encounter (Signed)
New message    Pt c/o Shortness Of Breath: STAT if SOB developed within the last 24 hours or pt is noticeably SOB on the phone  1. Are you currently SOB (can you hear that pt is SOB on the phone)?  Pt stopped by check out after her coumadin appt and stated that she had be having SOB for a couple of months and wanted to know if Dr Johnsie Cancel wanted to see her.  No available soon appt so she wanted the nurse to call her.  2. How long have you been experiencing SOB? SOB for a couple of months  3. Are you SOB when sitting or when up moving around? Moving around  4. Are you currently experiencing any other symptoms? no

## 2021-11-01 NOTE — Patient Instructions (Signed)
Description   Do not take any Warfarin today and take 5mg  tomorrow then START taking Warfarin 10mg  (white tablet) daily . Recheck INR in 1 week.   Coumadin Clinic [phone].

## 2021-11-01 NOTE — Telephone Encounter (Signed)
Patient complaining of having SOB with activity that has been going on for the last couple of months. Patient denies any chest pain. Patient stated she will have occasional palpitations, but takes her metoprolol and feels better. Patient takes metoprolol 50 mg in the PM, Lasix 20 mg daily, and diltiazem 240 mg daily. Patient stated she does not have any BLE edema. Patient did not have any BP/HR readings. Made patient an appointment with Dr. Johnsie Cancel for her 6 month follow up to be evaluated. Will send message to Dr. Johnsie Cancel to see if he wants any testing before her appointment in 2 weeks.

## 2021-11-06 ENCOUNTER — Other Ambulatory Visit (HOSPITAL_COMMUNITY): Payer: Self-pay

## 2021-11-06 NOTE — Progress Notes (Signed)
81 y.o. seen for f/u MVR  Also has history of chronic afib, previous smoker with COPD, DM PVD with carotid disease No documented CAD last myovue non ischemic March 2014  TTE showed normal MV function and normal EF 05/10/20 Carotids With plaque no stenosis     11/17/20 had digoxin toxicity with level over 3 and junctional bigeminal rhythm Resolved after stopping  Post Monitor 12/12/20 shows afib rates 70's and no malignant ventricular beats or junctional rhythm  02/15/21 hospitalized with sepsis unknown source fever, abdominal pain diarrhea Afib rates elevated duringAdmission due to pain/fever d/c with Cardizem 240 mg and Toprol 50 mg W/U negative but patient left before TEE and PET scan to look for source of infection   TTE 02/10/21 EF 55% severe bi atrial enlargement  MVR St Jude mechanical mean gradient 4 trivial MR no obvious SBE moderate to severe TR   She called office with more dyspnea last couple months No chest pain rare palpitations  Weight is up Has had non productive cough no fevers.    ROS: Denies fever, malais, weight loss, blurry vision, decreased visual acuity, cough, sputum, SOB, hemoptysis, pleuritic pain, palpitaitons, heartburn, abdominal pain, melena, lower extremity edema, claudication, or rash.  All other systems reviewed and negative  General: There were no vitals filed for this visit.     Affect appropriate Healthy:  Beth Miller  HEENT: normal Neck supple with no adenopathy JVP elevated no bruits no thyromegaly Lungs clear with no wheezing and good diaphragmatic motion Heart:  S1 click no MR murmur post sternotomy  Abdomen: benighn, BS positve, no tenderness, no AAA no bruit.  No HSM or HJR Distal pulses intact with no bruits No edema Neuro non-focal Skin warm and dry No muscular weakness   Current Outpatient Medications  Medication Sig Dispense Refill   Accu-Chek Softclix Lancets lancets 1 each by Other route 2 (two) times daily. Use to check blood  sugars twice a day 200 each 3   acetaminophen (TYLENOL) 500 MG tablet Take 1,000 mg by mouth every 6 (six) hours as needed for mild pain.     Alcohol Swabs (B-D SINGLE USE SWABS REGULAR) PADS 1 each by Does not apply route 2 (two) times daily. Use to clean finger to check blood sugars twice a day 200 each 3   amoxicillin (AMOXIL) 500 MG tablet Take four capsules (2,000 mg) one hour prior to all dental visits. 8 tablet 11   Blood Glucose Calibration (ACCU-CHEK AVIVA) SOLN 1 each by In Vitro route as directed. 3 each 1   Cholecalciferol (VITAMIN D3) 1000 UNIT tablet Take 1,000 Units by mouth daily.     cyclobenzaprine (FLEXERIL) 10 MG tablet take 1/2 to 1 TABLET BY MOUTH THREE TIMES DAILY AS NEEDED FOR MUSCLE SPASMS 90 tablet 1   diltiazem (CARDIZEM CD) 240 MG 24 hr capsule Take 1 capsule (240 mg total) by mouth daily. 90 capsule 3   furosemide (LASIX) 20 MG tablet TAKE 1 TABLET DAILY IN THE MORNING. 90 tablet 3   gabapentin (NEURONTIN) 100 MG capsule Take 1 capsule (100 mg total) by mouth at bedtime. 90 capsule 3   glucose blood (ACCU-CHEK AVIVA PLUS) test strip TEST BLOOD SUGAR TWICE DAILY 200 strip 3   Lancet Devices (ACCU-CHEK SOFTCLIX) lancets 1 each by Other route 2 (two) times daily. Use as instructed 1 each 5   loratadine (CLARITIN) 10 MG tablet Take 1 tablet (10 mg total) by mouth daily as needed for allergies. Take in  am 100 tablet 3   memantine (NAMENDA) 5 MG tablet Take 1 tablet (5 mg total) by mouth 2 (two) times daily. 180 tablet 3   metFORMIN (GLUCOPHAGE) 500 MG tablet Take 1 tablet (500 mg total) by mouth daily with breakfast. 90 tablet 3   metoprolol succinate (TOPROL XL) 50 MG 24 hr tablet Take 1 tablet (50 mg total) by mouth daily. 90 tablet 3   mirabegron ER (MYRBETRIQ) 25 MG TB24 tablet Take 1 tablet (25 mg total) by mouth daily. 90 tablet 3   oxyCODONE (OXY IR/ROXICODONE) 5 MG immediate release tablet Take 1 tablet (5 mg total) by mouth every 6 (six) hours as needed for severe  pain. 20 tablet 0   pantoprazole (PROTONIX) 40 MG tablet Take 1 tablet (40 mg total) by mouth daily. Take in am 90 tablet 3   potassium chloride (KLOR-CON) 10 MEQ tablet Take 1 tablet (10 mEq total) by mouth daily. 90 tablet 3   triamcinolone cream (KENALOG) 0.5 % Apply 1 application topically 3 (three) times daily as needed. 60 g 1   vitamin B-12 (CYANOCOBALAMIN) 1000 MCG tablet Take 1 tablet (1,000 mcg total) by mouth daily. 90 tablet 3   warfarin (COUMADIN) 10 MG tablet TAKE ONE TABLET BY MOUTH daily AS DIRECTED 105 tablet 0   warfarin (COUMADIN) 5 MG tablet As directed 30 tablet 3   No current facility-administered medications for this visit.    Allergies  Aricept [donepezil hcl], Food, Furosemide, Quinine, Spironolactone, Tramadol, and Sulfadiazine  Electrocardiogram:  11/15/2021 afib rate 116 otherwise normal   Assessment and Plan Afib:  ? Dig toxicity 11/17/20  regularized ? Junctional with paired PVC;s Now resolved off digoxin and toxicity confirmed With level  3.8 Back on cardizem and Toprol   HTN:  Well controlled.  Continue current medications and low sodium Dash type diet. Decrease lasix/K as azotemic When found to have dig toxicity  Carotid: Plaque on duplex  05/24/20 no stenosis observe   MVR:  Normal valve click  Echo reviewed 8/56/31 normal function EF normal   DM:  Discussed low carb diet.  Target hemoglobin A1c is 6.5 or less.  Continue current medications.  Dyspnea:  Etiology not clear increase lasix/K to bid Check labs including Hct,BUN,CR and BNP  Anticoagulation: lNR;s been elevated last 3 months dose adjust in coumadin clinic   F/u with me  in 4-6 weeks   Labs see above Increase Lasix / K 20 /10 bid    Jenkins Rouge

## 2021-11-08 ENCOUNTER — Ambulatory Visit: Payer: Medicare HMO

## 2021-11-08 ENCOUNTER — Other Ambulatory Visit: Payer: Self-pay

## 2021-11-08 DIAGNOSIS — I4891 Unspecified atrial fibrillation: Secondary | ICD-10-CM | POA: Diagnosis not present

## 2021-11-08 DIAGNOSIS — Z5181 Encounter for therapeutic drug level monitoring: Secondary | ICD-10-CM

## 2021-11-08 DIAGNOSIS — Z7901 Long term (current) use of anticoagulants: Secondary | ICD-10-CM | POA: Diagnosis not present

## 2021-11-08 LAB — POCT INR: INR: 2.6 (ref 2.0–3.0)

## 2021-11-08 NOTE — Patient Instructions (Addendum)
Description   Continue taking Warfarin 10mg  (white tablet) daily . Recheck INR in 3 weeks.  Stay consistent with greens - 2-3 times per week  Coumadin Clinic [phone].

## 2021-11-15 ENCOUNTER — Ambulatory Visit: Payer: Medicare HMO | Admitting: Cardiovascular Disease

## 2021-11-15 ENCOUNTER — Other Ambulatory Visit: Payer: Self-pay

## 2021-11-15 ENCOUNTER — Encounter: Payer: Self-pay | Admitting: Cardiovascular Disease

## 2021-11-15 VITALS — BP 112/72 | HR 96 | Ht 62.0 in | Wt 191.0 lb

## 2021-11-15 DIAGNOSIS — Z9889 Other specified postprocedural states: Secondary | ICD-10-CM

## 2021-11-15 DIAGNOSIS — I4891 Unspecified atrial fibrillation: Secondary | ICD-10-CM | POA: Diagnosis not present

## 2021-11-15 DIAGNOSIS — R0609 Other forms of dyspnea: Secondary | ICD-10-CM | POA: Diagnosis not present

## 2021-11-15 DIAGNOSIS — I10 Essential (primary) hypertension: Secondary | ICD-10-CM

## 2021-11-15 MED ORDER — FUROSEMIDE 20 MG PO TABS: 20.0000 mg | ORAL_TABLET | Freq: Two times a day (BID) | ORAL | 3 refills | Status: DC

## 2021-11-15 MED ORDER — POTASSIUM CHLORIDE CRYS ER 10 MEQ PO TBCR: 10.0000 meq | EXTENDED_RELEASE_TABLET | Freq: Two times a day (BID) | ORAL | 3 refills | Status: DC

## 2021-11-15 NOTE — Patient Instructions (Signed)
Medication Instructions:  Your physician has recommended you make the following change in your medication:  1-Increase lasix 20 mg by mouth twice daily 2-Increase potassium 10 meq by mouth twice daily  *If you need a refill on your cardiac medications before your next appointment, please call your pharmacy*   Lab Work: Your physician recommends that you have lab work today- BMET, BNP, CBC  If you have labs (blood work) drawn today and your tests are completely normal, you will receive your results only by: [place] (if you have MyChart) OR A paper copy in the mail If you have any lab test that is abnormal or we need to change your treatment, we will call you to review the results.  Testing/Procedures: None ordered today.  Follow-Up: At [facility], you and your health needs are our priority.  As part of our continuing mission to provide you with exceptional heart care, we have created designated Provider Care Teams.  These Care Teams include your primary Cardiologist (physician) and Advanced Practice Providers (APPs -  Physician Assistants and Nurse Practitioners) who all work together to provide you with the care you need, when you need it.  We recommend signing up for the patient portal called "MyChart".  Sign up information is provided on this After Visit Summary.  MyChart is used to connect with patients for Virtual Visits (Telemedicine).  Patients are able to view lab/test results, encounter notes, upcoming appointments, etc.  Non-urgent messages can be sent to your provider as well.   To learn more about what you can do with MyChart, go to [email or web address].    Your next appointment:   4 week(s)  The format for your next appointment:   In Person  Provider:   Robbie Lis, PA-C, Melina Copa, PA-C, Ermalinda Barrios, PA-C, Christen Bame, NP, or Richardson Dopp, PA-C     Then, Jenkins Rouge, MD will plan to see you again in 1 year(s).{

## 2021-11-16 LAB — CBC WITH DIFFERENTIAL/PLATELET
Basophils Absolute: 0.1 10*3/uL (ref 0.0–0.2)
Basos: 1 %
EOS (ABSOLUTE): 0.1 10*3/uL (ref 0.0–0.4)
Eos: 1 %
Hematocrit: 32.2 % — ABNORMAL LOW (ref 34.0–46.6)
Hemoglobin: 11 g/dL — ABNORMAL LOW (ref 11.1–15.9)
Immature Grans (Abs): 0 10*3/uL (ref 0.0–0.1)
Immature Granulocytes: 0 %
Lymphocytes Absolute: 1.4 10*3/uL (ref 0.7–3.1)
Lymphs: 28 %
MCH: 26.6 pg (ref 26.6–33.0)
MCHC: 34.2 g/dL (ref 31.5–35.7)
MCV: 78 fL — ABNORMAL LOW (ref 79–97)
Monocytes Absolute: 0.7 10*3/uL (ref 0.1–0.9)
Monocytes: 14 %
Neutrophils Absolute: 2.8 10*3/uL (ref 1.4–7.0)
Neutrophils: 56 %
Platelets: 263 10*3/uL (ref 150–450)
RBC: 4.14 x10E6/uL (ref 3.77–5.28)
RDW: 16.1 % — ABNORMAL HIGH (ref 11.7–15.4)
WBC: 5 10*3/uL (ref 3.4–10.8)

## 2021-11-16 LAB — BASIC METABOLIC PANEL
BUN/Creatinine Ratio: 21 (ref 12–28)
BUN: 24 mg/dL (ref 8–27)
CO2: 23 mmol/L (ref 20–29)
Calcium: 9.5 mg/dL (ref 8.7–10.3)
Chloride: 103 mmol/L (ref 96–106)
Creatinine, Ser: 1.17 mg/dL — ABNORMAL HIGH (ref 0.57–1.00)
Glucose: 200 mg/dL — ABNORMAL HIGH (ref 70–99)
Potassium: 4.3 mmol/L (ref 3.5–5.2)
Sodium: 142 mmol/L (ref 134–144)
eGFR: 47 mL/min/{1.73_m2} — ABNORMAL LOW (ref >59)

## 2021-11-16 LAB — PRO B NATRIURETIC PEPTIDE: NT-Pro BNP: 274 pg/mL (ref 0–738)

## 2021-11-28 ENCOUNTER — Other Ambulatory Visit: Payer: Self-pay

## 2021-11-28 ENCOUNTER — Ambulatory Visit (INDEPENDENT_AMBULATORY_CARE_PROVIDER_SITE_OTHER): Payer: Medicare Other

## 2021-11-28 DIAGNOSIS — Z7901 Long term (current) use of anticoagulants: Secondary | ICD-10-CM | POA: Diagnosis not present

## 2021-11-28 DIAGNOSIS — I4891 Unspecified atrial fibrillation: Secondary | ICD-10-CM

## 2021-11-28 LAB — POCT INR: INR: 2.4 (ref 2.0–3.0)

## 2021-11-28 NOTE — Patient Instructions (Addendum)
Description   Take 15mg  today and then continue taking Warfarin 10mg  (white tablet) daily Recheck INR in 2 weeks. Stay consistent with greens - 2-3 times per week  Coumadin Clinic [phone].

## 2021-11-29 DIAGNOSIS — E119 Type 2 diabetes mellitus without complications: Secondary | ICD-10-CM | POA: Diagnosis not present

## 2021-11-29 DIAGNOSIS — Z961 Presence of intraocular lens: Secondary | ICD-10-CM | POA: Diagnosis not present

## 2021-12-11 NOTE — Progress Notes (Unsigned)
Office Visit    Patient Name: Beth Miller Date of Encounter: 12/11/2021  PCP:  Cassandria Anger, MD   [place]  Cardiologist:  Jenkins Rouge, MD  Advanced Practice Provider:  No care team member to display Electrophysiologist:  None   Chief Complaint    Beth Miller is a 81 y.o. female with a hx of chronic atrial fibrillation, previous smoker with COPD, diabetes mellitus, PVD with carotid disease presents today for follow-up appointment.  The patient has no documented CAD.  Last Myoview was nonischemic in March 2014.  Recent TTE showed normal LV function, normal EF.  Carotid ultrasound showed plaque with no stenosis 05/10/2020  January 2022 the patient had digoxin toxicity with level over 3 and junctional bigeminal rhythm.  This resolved after stopping digoxin.  Post monitor February 2022 showed A-fib rates in the 70s with no malignant ventricular beats or junctional rhythm.  April 2022 she was hospitalized with sepsis, unknown fever source, abdominal pain, diarrhea, atrial fibrillation.  Rates during admission were elevated due to pain, fever, and the discontinuation of Cardizem and Toprol.  TTE was performed April 2022 which showed EF 55% severe biatrial enlargement Saint Jude mechanical mean gradient 4, trivial MR, no obvious SBE, moderate to severe TR.  She was last seen in January 2023 by Dr. Johnsie Cancel.  She had called the office with more dyspnea on exertion.  No chest pain, rare palpitations.  Weight was up at the time.  She did not have any productive cough or fever.  Today, she *** Past Medical History    Past Medical History:  Diagnosis Date   AF (atrial fibrillation) (HCC)    ALLERGIC RHINITIS    Blood transfusion without reported diagnosis    Cataract, senile    Chest pain    CHF (congestive heart failure) (HCC)    Chronic anticoagulation    Diabetes mellitus    Diabetes mellitus    Disorder of oral soft tissue    of mouth    Diverticulosis    GERD (gastroesophageal reflux disease)    Hematoma    Hypertension    Leg pain    Low back pain    Paresthesia    Rheumatic fever    Status post mitral valve replacement    St. Jude valve   Stroke ([place])    tia   Sweating    Vitamin D deficiency    Past Surgical History:  Procedure Laterality Date   ABDOMINAL HYSTERECTOMY     CARDIAC VALVE REPLACEMENT  1995   Mitral valve prosthesis; st jude   CHOLECYSTECTOMY      Allergies  Allergies  Allergen Reactions   Aricept [Donepezil Hcl]     Intolerance - bad dreams   Food     Bananas and Fish, cause sinus drainage   Furosemide     cramps   Quinine Other (See Comments)    Can't hear   Spironolactone     constipation   Tramadol     headache   Sulfadiazine Itching and Rash     EKGs/Labs/Other Studies Reviewed:   The following studies were reviewed today:  Echocardiogram 02/09/2021  IMPRESSIONS     1. Left ventricular ejection fraction, by estimation, is 55%%. The left  ventricle has low normal function. The left ventricle has no regional wall  motion abnormalities. There is mild left ventricular hypertrophy. Left  ventricular diastolic parameters  are indeterminate.   2. Right ventricular systolic function  is low normal. The right  ventricular size is normal. There is moderately elevated pulmonary artery  systolic pressure.   3. Left atrial size was severely dilated.   4. Right atrial size was severely dilated.   5. MV prosthesis (St. Jude mechanical) is well seated, appears to open  well. mean gradient through the valve is approximately 4 mm Hg. . The  mitral valve has been repaired/replaced. Trivial mitral valve  regurgitation.   6. TR is eccentric, directed toward the interatrial septum.. Tricuspid  valve regurgitation is moderate to severe.   7. The aortic valve is tricuspid. Aortic valve regurgitation is not  visualized. Mild to moderate aortic valve sclerosis/calcification is  present,  without any evidence of aortic stenosis.   8. Aortic dilatation noted. There is mild dilatation of the ascending  aorta, measuring 40 mm.   9. The inferior vena cava is dilated in size with <50% respiratory  variability, suggesting right atrial pressure of 15 mmHg.   EKG:  EKG is *** ordered today.  The ekg ordered today demonstrates ***  Recent Labs: 01/30/2021: TSH 3.56 02/14/2021: ALT 32; Magnesium 1.8 11/15/2021: BUN 24; Creatinine, Ser 1.17; Hemoglobin 11.0; NT-Pro BNP 274; Platelets 263; Potassium 4.3; Sodium 142  Recent Lipid Panel    Component Value Date/Time   CHOL 106 04/27/2019 1041   TRIG 177.0 (H) 04/27/2019 1041   HDL 37.40 (L) 04/27/2019 1041   CHOLHDL 3 04/27/2019 1041   VLDL 35.4 04/27/2019 1041   LDLCALC 33 04/27/2019 1041    Risk Assessment/Calculations:  {Does this patient have ATRIAL FIBRILLATION?:[id number]}  Home Medications   No outpatient medications have been marked as taking for the 12/12/21 encounter (Appointment) with Richardson Dopp T, PA-C.     Review of Systems   ***   All other systems reviewed and are otherwise negative except as noted above.  Physical Exam    VS:  LMP  (LMP Unknown)  , BMI There is no height or weight on file to calculate BMI.  Wt Readings from Last 3 Encounters:  11/15/21 191 lb (86.6 kg)  05/14/21 171 lb 3.2 oz (77.7 kg)  05/08/21 170 lb 6.4 oz (77.3 kg)     GEN: Well nourished, well developed, in no acute distress. HEENT: normal. Neck: Supple, no JVD, carotid bruits, or masses. Cardiac: ***RRR, no murmurs, rubs, or gallops. No clubbing, cyanosis, edema.  ***Radials/PT 2+ and equal bilaterally.  Respiratory:  ***Respirations regular and unlabored, clear to auscultation bilaterally. GI: Soft, nontender, nondistended. MS: No deformity or atrophy. Skin: Warm and dry, no rash. Neuro:  Strength and sensation are intact. Psych: Normal affect.  Assessment & Plan    Atrial fibrillation  Hypertension  Carotid artery  disease  MVR, mechanical  Diabetes mellitus  Dyspnea  Anticoagulation      Disposition: Follow up {follow up:15908} with Jenkins Rouge, MD or APP.  Signed, Elgie Collard, PA-C 12/11/2021, 6:58 PM [facility]

## 2021-12-12 ENCOUNTER — Ambulatory Visit: Payer: Medicare Other

## 2021-12-12 ENCOUNTER — Ambulatory Visit: Payer: Medicare Other | Admitting: Physician Assistant

## 2021-12-12 DIAGNOSIS — Z952 Presence of prosthetic heart valve: Secondary | ICD-10-CM

## 2021-12-12 DIAGNOSIS — I4891 Unspecified atrial fibrillation: Secondary | ICD-10-CM

## 2021-12-12 DIAGNOSIS — I10 Essential (primary) hypertension: Secondary | ICD-10-CM

## 2021-12-12 DIAGNOSIS — N1831 Chronic kidney disease, stage 3a: Secondary | ICD-10-CM

## 2021-12-12 DIAGNOSIS — R0609 Other forms of dyspnea: Secondary | ICD-10-CM

## 2021-12-18 ENCOUNTER — Other Ambulatory Visit: Payer: Self-pay

## 2021-12-18 ENCOUNTER — Ambulatory Visit (INDEPENDENT_AMBULATORY_CARE_PROVIDER_SITE_OTHER): Payer: Medicare Other

## 2021-12-18 DIAGNOSIS — Z0000 Encounter for general adult medical examination without abnormal findings: Secondary | ICD-10-CM | POA: Diagnosis not present

## 2021-12-18 NOTE — Progress Notes (Addendum)
I connected with Beth Miller today by telephone and verified that I am speaking with the correct person using two identifiers. Location patient: home Location provider: work Persons participating in the virtual visit: patient, provider.   I discussed the limitations, risks, security and privacy concerns of performing an evaluation and management service by telephone and the availability of in person appointments. I also discussed with the patient that there may be a patient responsible charge related to this service. The patient expressed understanding and verbally consented to this telephonic visit.    Interactive audio and video telecommunications were attempted between this provider and patient, however failed, due to patient having technical difficulties OR patient did not have access to video capability.  We continued and completed visit with audio only.  Some vital signs may be absent or patient reported.   Time Spent with patient on telephone encounter: 40 minutes  Subjective:   Beth Miller is a 81 y.o. female who presents for Medicare Annual (Subsequent) preventive examination.  Review of Systems     Cardiac Risk Factors include: advanced age (>71men, >11 women);diabetes mellitus;dyslipidemia;family history of premature cardiovascular disease;hypertension     Objective:    There were no vitals filed for this visit. There is no height or weight on file to calculate BMI.  Advanced Directives 12/18/2021 02/06/2021 06/18/2019 02/26/2016 09/09/2014 07/08/2014  Does Patient Have a Medical Advance Directive? No No No No No No  Would patient like information on creating a medical advance directive? No - Patient declined No - Patient declined - (No Data) No - patient declined information -    Current Medications (verified) Outpatient Encounter Medications as of 12/18/2021  Medication Sig   Accu-Chek Softclix Lancets lancets 1 each by Other route 2 (two) times daily. Use to check  blood sugars twice a day   acetaminophen (TYLENOL) 500 MG tablet Take 1,000 mg by mouth every 6 (six) hours as needed for mild pain.   Alcohol Swabs (B-D SINGLE USE SWABS REGULAR) PADS 1 each by Does not apply route 2 (two) times daily. Use to clean finger to check blood sugars twice a day   amoxicillin (AMOXIL) 500 MG tablet Take four capsules (2,000 mg) one hour prior to all dental visits.   Blood Glucose Calibration (ACCU-CHEK AVIVA) SOLN 1 each by In Vitro route as directed.   Cholecalciferol (VITAMIN D3) 1000 UNIT tablet Take 1,000 Units by mouth daily.   cyclobenzaprine (FLEXERIL) 10 MG tablet take 1/2 to 1 TABLET BY MOUTH THREE TIMES DAILY AS NEEDED FOR MUSCLE SPASMS   diltiazem (CARDIZEM CD) 240 MG 24 hr capsule Take 1 capsule (240 mg total) by mouth daily.   furosemide (LASIX) 20 MG tablet Take 1 tablet (20 mg total) by mouth 2 (two) times daily.   gabapentin (NEURONTIN) 100 MG capsule Take 1 capsule (100 mg total) by mouth at bedtime.   glucose blood (ACCU-CHEK AVIVA PLUS) test strip TEST BLOOD SUGAR TWICE DAILY   Lancet Devices (ACCU-CHEK SOFTCLIX) lancets 1 each by Other route 2 (two) times daily. Use as instructed   loratadine (CLARITIN) 10 MG tablet Take 1 tablet (10 mg total) by mouth daily as needed for allergies. Take in am   memantine (NAMENDA) 5 MG tablet Take 1 tablet (5 mg total) by mouth 2 (two) times daily.   metFORMIN (GLUCOPHAGE) 500 MG tablet Take 1 tablet (500 mg total) by mouth daily with breakfast.   metoprolol succinate (TOPROL XL) 50 MG 24 hr tablet Take 1 tablet (50  mg total) by mouth daily.   mirabegron ER (MYRBETRIQ) 25 MG TB24 tablet Take 1 tablet (25 mg total) by mouth daily.   oxyCODONE (OXY IR/ROXICODONE) 5 MG immediate release tablet Take 1 tablet (5 mg total) by mouth every 6 (six) hours as needed for severe pain.   pantoprazole (PROTONIX) 40 MG tablet Take 1 tablet (40 mg total) by mouth daily. Take in am   potassium chloride (KLOR-CON M) 10 MEQ tablet Take  1 tablet (10 mEq total) by mouth 2 (two) times daily.   triamcinolone cream (KENALOG) 0.5 % Apply 1 application topically 3 (three) times daily as needed.   vitamin B-12 (CYANOCOBALAMIN) 1000 MCG tablet Take 1 tablet (1,000 mcg total) by mouth daily.   warfarin (COUMADIN) 10 MG tablet TAKE ONE TABLET BY MOUTH daily AS DIRECTED   warfarin (COUMADIN) 5 MG tablet As directed   No facility-administered encounter medications on file as of 12/18/2021.    Allergies (verified) Aricept [donepezil hcl], Food, Furosemide, Quinine, Spironolactone, Tramadol, and Sulfadiazine   History: Past Medical History:  Diagnosis Date   AF (atrial fibrillation) ([place])    ALLERGIC RHINITIS    Blood transfusion without reported diagnosis    Cataract, senile    Chest pain    CHF (congestive heart failure) ([facility])    Chronic anticoagulation    Diabetes mellitus    Diabetes mellitus    Disorder of oral soft tissue    of mouth   Diverticulosis    GERD (gastroesophageal reflux disease)    Hematoma    Hypertension    Leg pain    Low back pain    Paresthesia    Rheumatic fever    Status post mitral valve replacement    St. Jude valve   Stroke ([place])    tia   Sweating    Vitamin D deficiency    Past Surgical History:  Procedure Laterality Date   ABDOMINAL HYSTERECTOMY     CARDIAC VALVE REPLACEMENT  1995   Mitral valve prosthesis; st jude   CHOLECYSTECTOMY     Family History  Problem Relation Age of Onset   Diabetes Mother    Seizures Mother    Other Mother        brain tumor   Heart disease Brother    Diabetes Brother    Heart disease Brother    Pancreatic cancer Brother    Colon cancer Neg Hx    Social History   Socioeconomic History   Marital status: Married    Spouse name: Not on file   Number of children: 1   Years of education: Not on file   Highest education level: Not on file  Occupational History   Occupation: Retired    Fish farm manager: RETIRED  Tobacco Use   Smoking status: Former     Packs/day: 0.02    Years: 60.00    Pack years: 1.20    Types: Cigarettes    Quit date: 10/28/2002    Years since quitting: 19.1   Smokeless tobacco: Never   Tobacco comments:    quit x 13 years   Vaping Use   Vaping Use: Never used  Substance and Sexual Activity   Alcohol use: No    Alcohol/week: 0.0 standard drinks   Drug use: No   Sexual activity: Yes  Other Topics Concern   Not on file  Social History Narrative   Negative Family History of Colon Cancer      Regular exercise-yes, bowling  Daily caffeine Use-rare   Social Determinants of Health   Financial Resource Strain: Low Risk    Difficulty of Paying Living Expenses: Not hard at all  Food Insecurity: No Food Insecurity   Worried About Charity fundraiser in the Last Year: Never true   Arboriculturist in the Last Year: Never true  Transportation Needs: No Transportation Needs   Lack of Transportation (Medical): No   Lack of Transportation (Non-Medical): No  Physical Activity: Sufficiently Active   Days of Exercise per Week: 5 days   Minutes of Exercise per Session: 30 min  Stress: No Stress Concern Present   Feeling of Stress : Not at all  Social Connections: Socially Integrated   Frequency of Communication with Friends and Family: More than three times a week   Frequency of Social Gatherings with Friends and Family: More than three times a week   Attends Religious Services: More than 4 times per year   Active Member of [employer] or Organizations: Yes   Attends Music therapist: More than 4 times per year   Marital Status: Married    Tobacco Counseling Counseling given: Not Answered Tobacco comments: quit x 13 years    Clinical Intake:  Pre-visit preparation completed: Yes  Pain : No/denies pain     Nutritional Risks: None Diabetes: Yes CBG done?: No Did pt. bring in CBG monitor from home?: No  How often do you need to have someone help you when you read instructions, pamphlets,  or other written materials from your doctor or pharmacy?: 1 - Never What is the last grade level you completed in school?: 3 years of college  Diabetic? yes  Interpreter Needed?: No  Information entered by :: Lisette Abu, LPN   Activities of Daily Living In your present state of health, do you have any difficulty performing the following activities: 12/18/2021 02/15/2021  Hearing? N N  Vision? N N  Difficulty concentrating or making decisions? N N  Walking or climbing stairs? N Y  Dressing or bathing? N Y  Doing errands, shopping? N Y  Conservation officer, nature and eating ? N -  Using the Toilet? N -  In the past six months, have you accidently leaked urine? Y -  Comment wear pads for protection -  Do you have problems with loss of bowel control? Y -  Comment constant diarrhea -  Managing your Medications? N -  Managing your Finances? N -  Housekeeping or managing your Housekeeping? N -  Some recent data might be hidden    Patient Care Team: Plotnikov, Evie Lacks, MD as PCP - General Josue Hector, MD as PCP - Cardiology (Cardiology) Josue Hector, MD (Cardiology) Frederico Hamman, MD (Obstetrics and Gynecology) Nche, Charlene Brooke, NP as Nurse Practitioner (Internal Medicine) Thornton Park, MD as Consulting Physician (Gastroenterology) Delice Bison Darnelle Maffucci, [facility] as Pharmacist (Pharmacist)  Indicate any recent Medical Services you may have received from other than [facility] providers in the past year (date may be approximate).     Assessment:   This is a routine wellness examination for Jeanann.  Hearing/Vision screen Hearing Screening - Comments:: Patient denied any hearing difficulty.   No hearing aids.  Vision Screening - Comments:: Patient wears readers for small print.  [facility] exam done annually by: [facility]  Dietary issues and exercise activities discussed: Current Exercise Habits: Home exercise routine;Structured exercise class, Type of exercise: walking  (bowling), Time (Minutes): 30, Frequency (Times/Week): 5, Weekly Exercise (Minutes/Week):  150, Intensity: Moderate, Exercise limited by: cardiac condition(s)   Goals Addressed   None   Depression Screen PHQ 2/9 Scores 12/18/2021 08/03/2020 06/16/2019 02/26/2016 08/18/2015 07/19/2015  PHQ - 2 Score 0 0 0 0 0 0    Fall Risk Fall Risk  12/18/2021 08/03/2020 06/16/2019 02/26/2016 07/19/2015  Falls in the past year? 0 0 0 No Yes  Comment - - - one in shower but over a year ago -  Number falls in past yr: 0 0 0 - 1  Injury with Fall? 0 0 0 - Yes  Risk for fall due to : No Fall Risks - - - -  Follow up Falls evaluation completed - - - -    FALL RISK PREVENTION PERTAINING TO THE HOME:  Any stairs in or around the home? No  If so, are there any without handrails? No  Home free of loose throw rugs in walkways, pet beds, electrical cords, etc? Yes  Adequate lighting in your home to reduce risk of falls? Yes   ASSISTIVE DEVICES UTILIZED TO PREVENT FALLS:  Life alert? No  Use of a cane, walker or w/c? No  Grab bars in the bathroom? Yes  Shower chair or bench in shower? No  Elevated toilet seat or a handicapped toilet? Yes   TIMED UP AND GO:  Was the test performed? No .  Length of time to ambulate 10 feet: n/a sec.   Gait steady and fast without use of assistive device  Cognitive Function: Normal cognitive status assessed by direct observation by this Nurse Health Advisor. No abnormalities found.          Immunizations Immunization History  Administered Date(s) Administered   Fluad Quad(high Dose 65+) 06/16/2019, 08/03/2020, 09/06/2020, 08/11/2021   H1N1 10/03/2008   Influenza Split 09/12/2011, 09/28/2012   Influenza Whole 10/04/2004, 10/14/2007, 09/04/2010   Influenza, High Dose Seasonal PF 08/07/2016, 09/11/2017, 07/31/2018   Influenza,inj,Quad PF,6+ Mos 07/20/2013, 11/11/2014, 07/19/2015   PFIZER(Purple Top)SARS-COV-2 Vaccination 12/21/2019, 01/11/2020, 08/08/2020, 03/28/2021    Pneumococcal Conjugate-13 04/28/2014   Pneumococcal Polysaccharide-23 10/03/2006   Tdap 05/08/2016    TDAP status: Up to date  Flu Vaccine status: Up to date  Pneumococcal vaccine status: Up to date  Covid-19 vaccine status: Completed vaccines  Qualifies for Shingles Vaccine? Yes   Zostavax completed No   Shingrix Completed?: No.    Education has been provided regarding the importance of this vaccine. Patient has been advised to call insurance company to determine out of pocket expense if they have not yet received this vaccine. Advised may also receive vaccine at local pharmacy or Health Dept. Verbalized acceptance and understanding.  Screening Tests Health Maintenance  Topic Date Due   FOOT EXAM  Never done   URINE MICROALBUMIN  Never done   Zoster Vaccines- Shingrix (1 of 2) Never done   DEXA SCAN  Never done   COVID-19 Vaccine (5 - Booster for Pfizer series) 05/23/2021   OPHTHALMOLOGY EXAM  08/01/2021   HEMOGLOBIN A1C  10/04/2021   TETANUS/TDAP  05/08/2026   Pneumonia Vaccine 74+ Years old  Completed   INFLUENZA VACCINE  Completed   HPV VACCINES  Aged Out    Health Maintenance  Health Maintenance Due  Topic Date Due   FOOT EXAM  Never done   URINE MICROALBUMIN  Never done   Zoster Vaccines- Shingrix (1 of 2) Never done   DEXA SCAN  Never done   COVID-19 Vaccine (5 - Booster for Pfizer series) 05/23/2021   OPHTHALMOLOGY  EXAM  08/01/2021   HEMOGLOBIN A1C  10/04/2021    Colorectal cancer screening: No longer required.   Mammogram status: No longer required due to age/patient refusal.  Bone Density Status:never done  Lung Cancer Screening: (Low Dose CT Chest recommended if Age 39-80 years, 30 pack-year currently smoking OR have quit w/in 15years.) does not qualify.   Lung Cancer Screening Referral: no  Additional Screening:  Hepatitis C Screening: does not qualify; Completed no  Vision Screening: Recommended annual ophthalmology exams for early detection of  glaucoma and other disorders of the eye. Is the patient up to date with their annual eye exam?  Yes  Who is the provider or what is the name of the office in which the patient attends annual eye exams? [facility] If pt is not established with a provider, would they like to be referred to a provider to establish care? No .   Dental Screening: Recommended annual dental exams for proper oral hygiene  Community Resource Referral / Chronic Care Management: CRR required this visit?  No   CCM required this visit?  No      Plan:     I have personally reviewed and noted the following in the patients chart:   Medical and social history Use of alcohol, tobacco or illicit drugs  Current medications and supplements including opioid prescriptions.  Functional ability and status Nutritional status Physical activity Advanced directives List of other physicians Hospitalizations, surgeries, and ER visits in previous 12 months Vitals Screenings to include cognitive, depression, and falls Referrals and appointments  In addition, I have reviewed and discussed with patient certain preventive protocols, quality metrics, and best practice recommendations. A written personalized care plan for preventive services as well as general preventive health recommendations were provided to patient.     Sheral Flow, LPN   05/22/2034   Nurse Notes:  Patient is cogitatively intact. There were no vitals filed for this visit. There is no height or weight on file to calculate BMI. Patient stated that she has no issues with gait or balance; does not use any assistive devices. Medications reviewed with patient; yes opioid use noted.  Medical screening examination/treatment/procedure(s) were performed by non-physician practitioner and as supervising physician I was immediately available for consultation/collaboration.  I agree with above. Lew Dawes, MD

## 2021-12-18 NOTE — Patient Instructions (Signed)
Beth Miller , Thank you for taking time to come for your Medicare Wellness Visit. I appreciate your ongoing commitment to your health goals. Please review the following plan we discussed and let me know if I can assist you in the future.   Screening recommendations/referrals: Colonoscopy: Not a candidate for screening due to age 81: Not a candidate for screening due to age Bone Density: Never done Recommended yearly ophthalmology/optometry visit for glaucoma screening and checkup Recommended yearly dental visit for hygiene and checkup  Vaccinations: Influenza vaccine: 07/2021 per patient Pneumococcal vaccine: 10/03/2006, 04/28/2014 Tdap vaccine: 05/08/2016; due every 10 years Shingles vaccine: never done   Covid-19: 12/21/2019, 3/16/221, 08/08/2020, 03/28/2021  Advanced directives: Advance directive discussed with you today. Even though you declined this today please call our office should you change your mind and we can give you the proper paperwork for you to fill out.  Conditions/risks identified: Yes; my goal is to stay alive and function on my own. Reviewed health maintenance screenings with patient today and relevant education, vaccines, and/or referrals were provided.    Continue to eat heart healthy diet (full of fruits, vegetables, whole grains, lean protein, water--limit salt, fat, and sugar intake) and increase physical activity as tolerated.   Continue doing brain stimulating activities (puzzles, reading, adult coloring books, staying active) to keep memory sharp.   Next appointment: Please schedule your next Medicare Wellness Visit with your Nurse Health Advisor in 1 year by calling [phone].   Preventive Care 54 Years and Older, Female Preventive care refers to lifestyle choices and visits with your health care provider that can promote health and wellness. What does preventive care include? A yearly physical exam. This is also called an annual well check. Dental  exams once or twice a year. Routine eye exams. Ask your health care provider how often you should have your eyes checked. Personal lifestyle choices, including: Daily care of your teeth and gums. Regular physical activity. Eating a healthy diet. Avoiding tobacco and drug use. Limiting alcohol use. Practicing safe sex. Taking low-dose aspirin every day. Taking vitamin and mineral supplements as recommended by your health care provider. What happens during an annual well check? The services and screenings done by your health care provider during your annual well check will depend on your age, overall health, lifestyle risk factors, and family history of disease. Counseling  Your health care provider may ask you questions about your: Alcohol use. Tobacco use. Drug use. Emotional well-being. Home and relationship well-being. Sexual activity. Eating habits. History of falls. Memory and ability to understand (cognition). Work and work Statistician. Reproductive health. Screening  You may have the following tests or measurements: Height, weight, and BMI. Blood pressure. Lipid and cholesterol levels. These may be checked every 5 years, or more frequently if you are over 69 years old. Skin check. Lung cancer screening. You may have this screening every year starting at age 23 if you have a 30-pack-year history of smoking and currently smoke or have quit within the past 15 years. Fecal occult blood test (FOBT) of the stool. You may have this test every year starting at age 6. Flexible sigmoidoscopy or colonoscopy. You may have a sigmoidoscopy every 5 years or a colonoscopy every 10 years starting at age 39. Hepatitis C blood test. Hepatitis B blood test. Sexually transmitted disease (STD) testing. Diabetes screening. This is done by checking your blood sugar (glucose) after you have not eaten for a while (fasting). You may have this done every 1-3 years.  Bone density scan. This is done  to screen for osteoporosis. You may have this done starting at age 31. Mammogram. This may be done every 1-2 years. Talk to your health care provider about how often you should have regular mammograms. Talk with your health care provider about your test results, treatment options, and if necessary, the need for more tests. Vaccines  Your health care provider may recommend certain vaccines, such as: Influenza vaccine. This is recommended every year. Tetanus, diphtheria, and acellular pertussis (Tdap, Td) vaccine. You may need a Td booster every 10 years. Zoster vaccine. You may need this after age 81. Pneumococcal 13-valent conjugate (PCV13) vaccine. One dose is recommended after age 32. Pneumococcal polysaccharide (PPSV23) vaccine. One dose is recommended after age 28. Talk to your health care provider about which screenings and vaccines you need and how often you need them. This information is not intended to replace advice given to you by your health care provider. Make sure you discuss any questions you have with your health care provider. Document Released: 11/10/2015 Document Revised: 07/03/2016 Document Reviewed: 08/15/2015 Elsevier Interactive Patient Education  2017 [place] Prevention in the Home Falls can cause injuries. They can happen to people of all ages. There are many things you can do to make your home safe and to help prevent falls. What can I do on the outside of my home? Regularly fix the edges of walkways and driveways and fix any cracks. Remove anything that might make you trip as you walk through a door, such as a raised step or threshold. Trim any bushes or trees on the path to your home. Use bright outdoor lighting. Clear any walking paths of anything that might make someone trip, such as rocks or tools. Regularly check to see if handrails are loose or broken. Make sure that both sides of any steps have handrails. Any raised decks and porches should have  guardrails on the edges. Have any leaves, snow, or ice cleared regularly. Use sand or salt on walking paths during winter. Clean up any spills in your garage right away. This includes oil or grease spills. What can I do in the bathroom? Use night lights. Install grab bars by the toilet and in the tub and shower. Do not use towel bars as grab bars. Use non-skid mats or decals in the tub or shower. If you need to sit down in the shower, use a plastic, non-slip stool. Keep the floor dry. Clean up any water that spills on the floor as soon as it happens. Remove soap buildup in the tub or shower regularly. Attach bath mats securely with double-sided non-slip rug tape. Do not have throw rugs and other things on the floor that can make you trip. What can I do in the bedroom? Use night lights. Make sure that you have a light by your bed that is easy to reach. Do not use any sheets or blankets that are too big for your bed. They should not hang down onto the floor. Have a firm chair that has side arms. You can use this for support while you get dressed. Do not have throw rugs and other things on the floor that can make you trip. What can I do in the kitchen? Clean up any spills right away. Avoid walking on wet floors. Keep items that you use a lot in easy-to-reach places. If you need to reach something above you, use a strong step stool that has a grab bar.  Keep electrical cords out of the way. Do not use floor polish or wax that makes floors slippery. If you must use wax, use non-skid floor wax. Do not have throw rugs and other things on the floor that can make you trip. What can I do with my stairs? Do not leave any items on the stairs. Make sure that there are handrails on both sides of the stairs and use them. Fix handrails that are broken or loose. Make sure that handrails are as long as the stairways. Check any carpeting to make sure that it is firmly attached to the stairs. Fix any carpet  that is loose or worn. Avoid having throw rugs at the top or bottom of the stairs. If you do have throw rugs, attach them to the floor with carpet tape. Make sure that you have a light switch at the top of the stairs and the bottom of the stairs. If you do not have them, ask someone to add them for you. What else can I do to help prevent falls? Wear shoes that: Do not have high heels. Have rubber bottoms. Are comfortable and fit you well. Are closed at the toe. Do not wear sandals. If you use a stepladder: Make sure that it is fully opened. Do not climb a closed stepladder. Make sure that both sides of the stepladder are locked into place. Ask someone to hold it for you, if possible. Clearly mark and make sure that you can see: Any grab bars or handrails. First and last steps. Where the edge of each step is. Use tools that help you move around (mobility aids) if they are needed. These include: Canes. Walkers. Scooters. Crutches. Turn on the lights when you go into a dark area. Replace any light bulbs as soon as they burn out. Set up your furniture so you have a clear path. Avoid moving your furniture around. If any of your floors are uneven, fix them. If there are any pets around you, be aware of where they are. Review your medicines with your doctor. Some medicines can make you feel dizzy. This can increase your chance of falling. Ask your doctor what other things that you can do to help prevent falls. This information is not intended to replace advice given to you by your health care provider. Make sure you discuss any questions you have with your health care provider. Document Released: 08/10/2009 Document Revised: 03/21/2016 Document Reviewed: 11/18/2014 Elsevier Interactive Patient Education  2017 [employer].

## 2021-12-19 ENCOUNTER — Other Ambulatory Visit: Payer: Self-pay

## 2021-12-19 ENCOUNTER — Ambulatory Visit: Payer: Medicare Other | Admitting: *Deleted

## 2021-12-19 DIAGNOSIS — I4891 Unspecified atrial fibrillation: Secondary | ICD-10-CM

## 2021-12-19 DIAGNOSIS — Z7901 Long term (current) use of anticoagulants: Secondary | ICD-10-CM

## 2021-12-19 DIAGNOSIS — Z5181 Encounter for therapeutic drug level monitoring: Secondary | ICD-10-CM

## 2021-12-19 LAB — POCT INR: INR: 2.6 (ref 2.0–3.0)

## 2021-12-19 NOTE — Patient Instructions (Signed)
Description   Continue taking Warfarin 10mg  (white tablet) daily Recheck INR in 3 weeks. Stay consistent with greens - 2-3 times per week  [facility] [phone].

## 2022-01-01 ENCOUNTER — Other Ambulatory Visit: Payer: Self-pay | Admitting: Cardiovascular Disease

## 2022-01-02 ENCOUNTER — Other Ambulatory Visit (HOSPITAL_BASED_OUTPATIENT_CLINIC_OR_DEPARTMENT_OTHER): Payer: Self-pay

## 2022-01-14 ENCOUNTER — Other Ambulatory Visit: Payer: Self-pay

## 2022-01-14 ENCOUNTER — Ambulatory Visit: Payer: Medicare Other | Admitting: *Deleted

## 2022-01-14 DIAGNOSIS — Z7901 Long term (current) use of anticoagulants: Secondary | ICD-10-CM | POA: Diagnosis not present

## 2022-01-14 DIAGNOSIS — I4891 Unspecified atrial fibrillation: Secondary | ICD-10-CM | POA: Diagnosis not present

## 2022-01-14 DIAGNOSIS — Z5181 Encounter for therapeutic drug level monitoring: Secondary | ICD-10-CM

## 2022-01-14 LAB — POCT INR: INR: 3.7 — AB (ref 2.0–3.0)

## 2022-01-14 NOTE — Patient Instructions (Signed)
Description   ?Hold warfarin today, then Continue taking Warfarin '10mg'$  (white tablet) daily. Recheck INR in 2 weeks. Stay consistent with greens 4 servings a week of greens.  ?[facility] [phone].  ?  ? ? ?

## 2022-01-19 ENCOUNTER — Other Ambulatory Visit: Payer: Self-pay | Admitting: Cardiovascular Disease

## 2022-01-21 NOTE — Progress Notes (Signed)
?Cardiology Office Note:   ? ?Date:  01/22/2022  ? ?ID:  Beth Miller, DOB 09-30-1941, MRN [id number] ? ?PCP:  Plotnikov, Evie Lacks, MD ?  ?[facility] ?Cardiologist:  Jenkins Rouge, MD    ? ?Referring MD: Cassandria Anger, MD  ? ?Chief Complaint: fatigue, not feeling as well as I used to ? ?History of Present Illness:   ? ?Beth Miller is a very pleasant 81 y.o. female with a hx of longstanding atrial fib, mechanical MVR on chronic Coumadin, former smoker with COPD, DM, PVD, aortic atherosclerosis seen on CT, and carotid artery disease. She requires life-time SBE prophylaxis. Had childhood rheumatic fever that required hospitalization.  ? ?Prosthetic mitral valve replacement 1995, on coumadin. She established care with our group prior to 2013 and has maintained regular follow-up. No documentation of CAD, low risk myoview 2014.  ? ?In January 2022 she had digoxin toxicity with level over 3 and junctional bigeminy on EKG which resolved with d/c of digoxin.  Post monitor 12/12/2020 shows A-fib with rates in the 70s, no malignant ventricular beats or junctional rhythm.  In April 2022 she was hospitalized with sepsis unknown source and atrial fibrillation rates were elevated during admission.  Medical management with Cardizem and Toprol. TEE was advised but patient left before it could be completed. TTE 02/10/21 revealed EF 55%, severe biatrial enlargement, stable MVR Saint Jude mechanical valve with mean gradient 4, trivial MR, no obvious SBE, moderate to severe TR.  ? ?She was last seen in our office on 11/15/2021 by Dr. Johnsie Cancel for worsening dyspnea and weight gain.  BNP was 274.  She was advised to increase Lasix to 20 mg twice daily and return for follow-up in 4 weeks.  ? ?Today, she is here alone for follow-up. She reports she is not feeling as well as she used to and has frequent fatigue. Feels "draggy" in the morning. States "it may be related to my age." No significant improvement in SOB or  edema since increasing Lasix. Bilateral lower extremity swelling with slight improvement with increased diuretic - of note, I do not appreciate significant leg edema. Has a history of sleep apnea but is not on CPAP because she could not sleep in the mask. Weight up 6 pounds from 1/19 by our scale. Sometimes forgets to take her Toprol XL at night and will wakes up with heart pounding, otherwise no palpitations. She denies chest pain, diaphoresis, presyncope, syncope, or melena. ?  ? ?Past Medical History:  ?Diagnosis Date  ? AF (atrial fibrillation) ([place])   ? ALLERGIC RHINITIS   ? Blood transfusion without reported diagnosis   ? Cataract, senile   ? Chest pain   ? CHF (congestive heart failure) ([place])   ? Chronic anticoagulation   ? Diabetes mellitus   ? Diabetes mellitus   ? Disorder of oral soft tissue   ? of mouth  ? Diverticulosis   ? GERD (gastroesophageal reflux disease)   ? Hematoma   ? Hypertension   ? Leg pain   ? Low back pain   ? Paresthesia   ? Rheumatic fever   ? Status post mitral valve replacement   ? St. Jude valve  ? Stroke [facility] [address])   ? tia  ? Sweating   ? Vitamin D deficiency   ? ? ?Past Surgical History:  ?Procedure Laterality Date  ? ABDOMINAL HYSTERECTOMY    ? CARDIAC VALVE REPLACEMENT  1995  ? Mitral valve prosthesis; st jude  ? CHOLECYSTECTOMY    ? ? ?  Current Medications: ?Current Meds  ?Medication Sig  ? Accu-Chek Softclix Lancets lancets 1 each by Other route 2 (two) times daily. Use to check blood sugars twice a day  ? acetaminophen (TYLENOL) 500 MG tablet Take 1,000 mg by mouth every 6 (six) hours as needed for mild pain.  ? Alcohol Swabs (B-D SINGLE USE SWABS REGULAR) PADS 1 each by Does not apply route 2 (two) times daily. Use to clean finger to check blood sugars twice a day  ? amoxicillin (AMOXIL) 500 MG tablet Take four capsules (2,000 mg) one hour prior to all dental visits.  ? Blood Glucose Calibration (ACCU-CHEK AVIVA) SOLN 1 each by In Vitro route as directed.  ? Cholecalciferol  (VITAMIN D3) 1000 UNIT tablet Take 1,000 Units by mouth daily.  ? cyclobenzaprine (FLEXERIL) 10 MG tablet take 1/2 to 1 TABLET BY MOUTH THREE TIMES DAILY AS NEEDED FOR MUSCLE SPASMS  ? diltiazem (CARDIZEM CD) 240 MG 24 hr capsule Take 1 capsule (240 mg total) by mouth daily.  ? gabapentin (NEURONTIN) 100 MG capsule Take 1 capsule (100 mg total) by mouth at bedtime.  ? glucose blood (ACCU-CHEK AVIVA PLUS) test strip TEST BLOOD SUGAR TWICE DAILY  ? Lancet Devices (ACCU-CHEK SOFTCLIX) lancets 1 each by Other route 2 (two) times daily. Use as instructed  ? loratadine (CLARITIN) 10 MG tablet Take 1 tablet (10 mg total) by mouth daily as needed for allergies. Take in am  ? memantine (NAMENDA) 5 MG tablet Take 1 tablet (5 mg total) by mouth 2 (two) times daily.  ? metFORMIN (GLUCOPHAGE) 500 MG tablet Take 1 tablet (500 mg total) by mouth daily with breakfast.  ? mirabegron ER (MYRBETRIQ) 25 MG TB24 tablet Take 1 tablet (25 mg total) by mouth daily.  ? oxyCODONE (OXY IR/ROXICODONE) 5 MG immediate release tablet Take 1 tablet (5 mg total) by mouth every 6 (six) hours as needed for severe pain.  ? pantoprazole (PROTONIX) 40 MG tablet Take 1 tablet (40 mg total) by mouth daily. Take in am  ? triamcinolone cream (KENALOG) 0.5 % Apply 1 application topically 3 (three) times daily as needed.  ? vitamin B-12 (CYANOCOBALAMIN) 1000 MCG tablet Take 1 tablet (1,000 mcg total) by mouth daily.  ? warfarin (COUMADIN) 10 MG tablet TAKE ONE TABLET BY MOUTH daily AS DIRECTED  ? [DISCONTINUED] furosemide (LASIX) 20 MG tablet Take 1 tablet (20 mg total) by mouth 2 (two) times daily.  ? [DISCONTINUED] metoprolol succinate (TOPROL XL) 50 MG 24 hr tablet Take 1 tablet (50 mg total) by mouth daily.  ? [DISCONTINUED] potassium chloride (KLOR-CON M) 10 MEQ tablet Take 1 tablet (10 mEq total) by mouth 2 (two) times daily.  ?  ? ?Allergies:   Aricept [donepezil hcl], Food, Furosemide, Quinine, Spironolactone, Tramadol, and Sulfadiazine  ? ?Social  History  ? ?Socioeconomic History  ? Marital status: Married  ?  Spouse name: Not on file  ? Number of children: 1  ? Years of education: Not on file  ? Highest education level: Not on file  ?Occupational History  ? Occupation: Retired  ?  Employer: RETIRED  ?Tobacco Use  ? Smoking status: Former  ?  Packs/day: 0.02  ?  Years: 60.00  ?  Pack years: 1.20  ?  Types: Cigarettes  ?  Quit date: 10/28/2002  ?  Years since quitting: 19.2  ? Smokeless tobacco: Never  ? Tobacco comments:  ?  quit x 13 years   ?Vaping Use  ? Vaping Use: Never  used  ?Substance and Sexual Activity  ? Alcohol use: No  ?  Alcohol/week: 0.0 standard drinks  ? Drug use: No  ? Sexual activity: Yes  ?Other Topics Concern  ? Not on file  ?Social History Narrative  ? Negative Family History of Colon Cancer  ?   ? Regular exercise-yes, bowling  ?   ? Daily caffeine Use-rare  ? ?Social Determinants of Health  ? ?Financial Resource Strain: Low Risk   ? Difficulty of Paying Living Expenses: Not hard at all  ?Food Insecurity: No Food Insecurity  ? Worried About Charity fundraiser in the Last Year: Never true  ? Ran Out of Food in the Last Year: Never true  ?Transportation Needs: No Transportation Needs  ? Lack of Transportation (Medical): No  ? Lack of Transportation (Non-Medical): No  ?Physical Activity: Sufficiently Active  ? Days of Exercise per Week: 5 days  ? Minutes of Exercise per Session: 30 min  ?Stress: No Stress Concern Present  ? Feeling of Stress : Not at all  ?Social Connections: Socially Integrated  ? Frequency of Communication with Friends and Family: More than three times a week  ? Frequency of Social Gatherings with Friends and Family: More than three times a week  ? Attends Religious Services: More than 4 times per year  ? Active Member of Clubs or Organizations: Yes  ? Attends Archivist Meetings: More than 4 times per year  ? Marital Status: Married  ?  ? ?Family History: ?The patient's family history includes Diabetes in her  brother and mother; Heart disease in her brother and brother; Other in her mother; Pancreatic cancer in her brother; Seizures in her mother. There is no history of Colon cancer. ? ?ROS:   ?Please see the history o

## 2022-01-22 ENCOUNTER — Other Ambulatory Visit: Payer: Self-pay

## 2022-01-22 ENCOUNTER — Ambulatory Visit (INDEPENDENT_AMBULATORY_CARE_PROVIDER_SITE_OTHER): Payer: Medicare Other

## 2022-01-22 ENCOUNTER — Ambulatory Visit: Payer: Medicare Other | Admitting: Nurse Practitioner

## 2022-01-22 ENCOUNTER — Telehealth: Payer: Self-pay | Admitting: *Deleted

## 2022-01-22 ENCOUNTER — Encounter: Payer: Self-pay | Admitting: Nurse Practitioner

## 2022-01-22 VITALS — BP 112/72 | HR 98 | Ht 62.0 in | Wt 197.8 lb

## 2022-01-22 DIAGNOSIS — Z7901 Long term (current) use of anticoagulants: Secondary | ICD-10-CM

## 2022-01-22 DIAGNOSIS — R5383 Other fatigue: Secondary | ICD-10-CM

## 2022-01-22 DIAGNOSIS — R0602 Shortness of breath: Secondary | ICD-10-CM

## 2022-01-22 DIAGNOSIS — I4821 Permanent atrial fibrillation: Secondary | ICD-10-CM

## 2022-01-22 DIAGNOSIS — Z9889 Other specified postprocedural states: Secondary | ICD-10-CM

## 2022-01-22 DIAGNOSIS — I059 Rheumatic mitral valve disease, unspecified: Secondary | ICD-10-CM | POA: Diagnosis not present

## 2022-01-22 DIAGNOSIS — I4891 Unspecified atrial fibrillation: Secondary | ICD-10-CM | POA: Diagnosis not present

## 2022-01-22 DIAGNOSIS — E785 Hyperlipidemia, unspecified: Secondary | ICD-10-CM

## 2022-01-22 DIAGNOSIS — I700 Atherosclerosis of aorta: Secondary | ICD-10-CM | POA: Diagnosis not present

## 2022-01-22 DIAGNOSIS — R079 Chest pain, unspecified: Secondary | ICD-10-CM

## 2022-01-22 LAB — LIPID PANEL
Chol/HDL Ratio: 2.5 ratio (ref 0.0–4.4)
Cholesterol, Total: 116 mg/dL (ref 100–199)
HDL: 47 mg/dL (ref >39)
LDL Chol Calc (NIH): 51 mg/dL (ref 0–99)
Triglycerides: 92 mg/dL (ref 0–149)
VLDL Cholesterol Cal: 18 mg/dL (ref 5–40)

## 2022-01-22 LAB — HEPATIC FUNCTION PANEL
ALT: 11 IU/L (ref 0–32)
AST: 18 IU/L (ref 0–40)
Albumin: 4.1 g/dL (ref 3.6–4.6)
Alkaline Phosphatase: 91 IU/L (ref 44–121)
Bilirubin Total: 0.4 mg/dL (ref 0.0–1.2)
Bilirubin, Direct: 0.16 mg/dL (ref 0.00–0.40)
Total Protein: 6.8 g/dL (ref 6.0–8.5)

## 2022-01-22 LAB — POCT INR: INR: 3.2 — AB (ref 2.0–3.0)

## 2022-01-22 MED ORDER — POTASSIUM CHLORIDE CRYS ER 10 MEQ PO TBCR: 10.0000 meq | EXTENDED_RELEASE_TABLET | Freq: Two times a day (BID) | ORAL | 3 refills | Status: DC

## 2022-01-22 MED ORDER — METOPROLOL SUCCINATE ER 50 MG PO TB24: 50.0000 mg | ORAL_TABLET | Freq: Every day | ORAL | 3 refills | Status: DC

## 2022-01-22 MED ORDER — FUROSEMIDE 40 MG PO TABS: 40.0000 mg | ORAL_TABLET | Freq: Every day | ORAL | 3 refills | Status: DC

## 2022-01-22 NOTE — Patient Instructions (Addendum)
Medication Instructions:  ? ?Your physician recommends that you continue on your current medications as directed. Please refer to the Current Medication list given to you today. ? ? ?*If you need a refill on your cardiac medications before your next appointment, please call your pharmacy* ? ? ?Lab Work: ? ?TODAY!!!!! LIPID/LFT ? ?If you have labs (blood work) drawn today and your tests are completely normal, you will receive your results only by: ?MyChart Message (if you have MyChart) OR ?A paper copy in the mail ?If you have any lab test that is abnormal or we need to change your treatment, we will call you to review the results. ? ? ?Testing/Procedures: ? ?You are scheduled for a Myocardial Perfusion Imaging Study on Thursday, April 6 at 10:15.  ? ?Please arrive 15 minutes prior to your appointment time for registration and insurance purposes.  ? ?The test will take approximately 3 to 4 hours to complete; you may bring reading material. If someone comes with you to your appointment, they will need to remain in the main lobby due to limited space in the testing area.  ? ?How to prepare for your Myocardial Perfusion test: ? ? Do not eat or drink 3 hours prior to your test, except you may have water.  ? ? Do not consume products containing caffeine (regular or decaffeinated) 12 hours prior to your test (ex: coffee, chocolate, soda, tea) ? ? Do bring a list of your current medications with you. If not listed below, you may take your medications as normal.  ? Bring any held medication to your appointment, as you may be required to take it once the test is complete.  ? ?Do wear comfortable clothes (no dresses or overalls) and walking shoes. Tennis shoes are preferred. No heels or open toed shoes. ? ?Do not wear perfume, or lotions (deodorant is allowed).  ? ?If these instructions are not followed, you test will have to be rescheduled.  ? ?Please report to [address] for your test. If you have  questions or concerns about your appointment, please call the Nuclear Lab at [phone]. ? ?If you cannot keep your appointment, please provide 24 hour notification to the Nuclear lab to avoid a possible $50 charge to your account.  ? ?You are scheduled for sleep study today.  ? ? ?Follow-Up: ?At [facility], you and your health needs are our priority.  As part of our continuing mission to provide you with exceptional heart care, we have created designated Provider Care Teams.  These Care Teams include your primary Cardiologist (physician) and Advanced Practice Providers (APPs -  Physician Assistants and Nurse Practitioners) who all work together to provide you with the care you need, when you need it. ? ?We recommend signing up for the patient portal called "MyChart".  Sign up information is provided on this After Visit Summary.  MyChart is used to connect with patients for Virtual Visits (Telemedicine).  Patients are able to view lab/test results, encounter notes, upcoming appointments, etc.  Non-urgent messages can be sent to your provider as well.   ?To learn more about what you can do with MyChart, go to [email or web address].   ? ?Your next appointment:   ?3 month(s) ? ?The format for your next appointment:   ?In Person ? ?Provider:   ?Jenkins Rouge, MD   ? ? ?

## 2022-01-22 NOTE — Telephone Encounter (Signed)
Set up date 01/22/22 ?

## 2022-01-22 NOTE — Patient Instructions (Signed)
Description   ?Continue on same dosage of Warfarin '10mg'$  (white tablet) daily. Recheck INR in 3 weeks. Stay consistent with greens 4 servings a week of greens.  ?[facility] [phone].  ?  ?  ?

## 2022-01-22 NOTE — Telephone Encounter (Signed)
Pt was seen in the office today with Beth Bame, NP who ordered an Itamar sleep study. Pt aware to not open the box until she is called with the PIN#.  ?

## 2022-01-25 NOTE — Telephone Encounter (Signed)
3/31 TURNER TO READ   ?NO PA REQUIRED ?OK TO SCHEDULE ?

## 2022-01-25 NOTE — Telephone Encounter (Signed)
Tried to reach pt to give her the PIN# to proceed with Itamar study, but vm full could not leave message.  ?

## 2022-01-28 ENCOUNTER — Telehealth (HOSPITAL_COMMUNITY): Payer: Self-pay

## 2022-01-28 NOTE — Telephone Encounter (Signed)
Attempted to contact the patient, the mailbox is full. Will try again later. Beth Miller EMTP ?

## 2022-01-28 NOTE — Telephone Encounter (Signed)
Called and made the patient aware that she may proceed with the [facility] Sleep Study. PIN # provided to the patient. Patient made aware that she will be contacted after the test has been read with the results and any recommendations. Patient verbalized understanding and thanked me for the call.  ? ?Pt will do sleep study this week.  ?

## 2022-01-31 ENCOUNTER — Ambulatory Visit (HOSPITAL_COMMUNITY): Payer: Medicare Other | Attending: Cardiovascular Disease

## 2022-01-31 ENCOUNTER — Encounter (INDEPENDENT_AMBULATORY_CARE_PROVIDER_SITE_OTHER): Payer: Medicare Other | Admitting: Cardiology

## 2022-01-31 DIAGNOSIS — E785 Hyperlipidemia, unspecified: Secondary | ICD-10-CM | POA: Diagnosis not present

## 2022-01-31 DIAGNOSIS — Z9889 Other specified postprocedural states: Secondary | ICD-10-CM | POA: Insufficient documentation

## 2022-01-31 DIAGNOSIS — G4734 Idiopathic sleep related nonobstructive alveolar hypoventilation: Secondary | ICD-10-CM | POA: Diagnosis not present

## 2022-01-31 DIAGNOSIS — R079 Chest pain, unspecified: Secondary | ICD-10-CM | POA: Diagnosis not present

## 2022-01-31 DIAGNOSIS — G4733 Obstructive sleep apnea (adult) (pediatric): Secondary | ICD-10-CM | POA: Diagnosis not present

## 2022-01-31 DIAGNOSIS — I4821 Permanent atrial fibrillation: Secondary | ICD-10-CM | POA: Insufficient documentation

## 2022-01-31 DIAGNOSIS — Z7901 Long term (current) use of anticoagulants: Secondary | ICD-10-CM | POA: Insufficient documentation

## 2022-01-31 LAB — MYOCARDIAL PERFUSION IMAGING
LV dias vol: 77 mL (ref 46–106)
LV sys vol: 42 mL
Nuc Stress EF: 46 %
Peak HR: 107 {beats}/min
Rest HR: 100 {beats}/min
Rest Nuclear Isotope Dose: 10.9 mCi
SDS: 3
SRS: 0
SSS: 3
ST Depression (mm): 0 mm
Stress Nuclear Isotope Dose: 33 mCi
TID: 1.04

## 2022-01-31 MED ORDER — TECHNETIUM TC 99M TETROFOSMIN IV KIT
33.0000 | PACK | Freq: Once | INTRAVENOUS | Status: AC | PRN
  Administered 2022-01-31: 33 via INTRAVENOUS
  Filled 2022-01-31: qty 33

## 2022-01-31 MED ORDER — TECHNETIUM TC 99M TETROFOSMIN IV KIT
10.9000 | PACK | Freq: Once | INTRAVENOUS | Status: AC | PRN
  Administered 2022-01-31: 10.9 via INTRAVENOUS
  Filled 2022-01-31: qty 11

## 2022-01-31 MED ORDER — REGADENOSON 0.4 MG/5ML IV SOLN
0.4000 mg | Freq: Once | INTRAVENOUS | Status: AC
  Administered 2022-01-31: 0.4 mg via INTRAVENOUS

## 2022-01-31 NOTE — Procedures (Signed)
? ?  SLEEP STUDY REPORT ?Patient Information ?Study Date: 01/31/22 ?Patient Name: Beth Miller ?Patient ID: [id number] ?Birth Date: 09/26/2041 ?Age: 81 ?Gender: Female ?BMI: 36.1 (W=196 lb, H=5' 2'') ?Referring Physician: Gwyndolyn Kaufman, MD ? ?TEST DESCRIPTION: Home sleep apnea testing was completed using the WatchPat, a Type 1 device, utilizing  ?peripheral arterial tonometry (PAT), chest movement, actigraphy, pulse oximetry, pulse rate, body position and snore.  ?AHI was calculated with apnea and hypopnea using valid sleep time as the denominator. RDI includes apneas,  ?hypopneas, and RERAs. The data acquired and the scoring of sleep and all associated events were performed in  ?accordance with the recommended standards and specifications as outlined in the AASM Manual for the Scoring of  ?Sleep and Associated Events 2.2.0 (2015). ?FINDINGS: ?1. Severe Obstructive Sleep Apnea with AHI 48.4/hr.  ?2. Minimal Central Sleep Apnea with pAHIc 5.8/hr. ?3. Oxygen desaturations as low as 74%. ?4. Severe snoring was present. O2 sats were < 88% for 56.6 min. ?5. Total sleep time was 3 hrs and 59 min. ?6. 34% of total sleep time was spent in REM sleep.  ?7. Prolonged sleep onset latency at 28 min.  ?8. Shortened REM sleep onset latency at 40 min.  ?9. Total awakenings were 9.  ? ?DIAGNOSIS:  ?Severe Obstructive Sleep Apnea (G47.33) ?Nocturnal Hypoxemia ? ?RECOMMENDATIONS: ?1. Clinical correlation of these findings is necessary. The decision to treat obstructive sleep apnea (OSA) is usually  ?based on the presence of apnea symptoms or the presence of associated medical conditions such as Hypertension,  ?Congestive Heart Failure, Atrial Fibrillation or Obesity. The most common symptoms of OSA are snoring, gasping for  ?breath while sleeping, daytime sleepiness and fatigue.  ? ?2. Initiating apnea therapy is recommended given the presence of symptoms and/or associated conditions.  ?Recommend proceeding with one of the  following: ? ? a. Auto-CPAP therapy with a pressure range of 5-20cm H2O. ? ? b. An oral appliance (OA) that can be obtained from certain dentists with expertise in sleep medicine. These are  ?primarily of use in non-obese patients with mild and moderate disease. ? ? c. An ENT consultation which may be useful to look for specific causes of obstruction and possible treatment  ?options. ? ? d. If patient is intolerant to PAP therapy, consider referral to ENT for evaluation for hypoglossal nerve stimulator.  ? ?3. Close follow-up is necessary to ensure success with CPAP or oral appliance therapy for maximum benefit . ? ?4. A follow-up oximetry study on CPAP is recommended to assess the adequacy of therapy and determine the need  ?for supplemental oxygen or the potential need for Bi-level therapy. An arterial blood gas to determine the adequacy of  ?baseline ventilation and oxygenation should also be considered. ? ?5. Healthy sleep recommendations include: adequate nightly sleep (normal 7-9 hrs/night), avoidance of caffeine after  ?noon and alcohol near bedtime, and maintaining a sleep environment that is cool, dark and quiet. ? ?6. Weight loss for overweight patients is recommended. Even modest amounts of weight loss can significantly  ?improve the severity of sleep apnea. ? ?7. Snoring recommendations include: weight loss where appropriate, side sleeping, and avoidance of alcohol before  ?bed. ? ?8. Operation of motor vehicle should be avoided when sleepy. ? ?Signature: ?Electronically Signed: 01/31/22 ?Fransico Him, MD; [facility]; [place], [place] Board of  Sleep Medicine ? ?

## 2022-02-04 ENCOUNTER — Ambulatory Visit: Payer: Medicare Other

## 2022-02-04 DIAGNOSIS — I4821 Permanent atrial fibrillation: Secondary | ICD-10-CM

## 2022-02-04 DIAGNOSIS — Z7901 Long term (current) use of anticoagulants: Secondary | ICD-10-CM

## 2022-02-04 DIAGNOSIS — E785 Hyperlipidemia, unspecified: Secondary | ICD-10-CM

## 2022-02-04 DIAGNOSIS — Z9889 Other specified postprocedural states: Secondary | ICD-10-CM

## 2022-02-04 DIAGNOSIS — R079 Chest pain, unspecified: Secondary | ICD-10-CM

## 2022-02-05 ENCOUNTER — Telehealth: Payer: Self-pay | Admitting: Internal Medicine

## 2022-02-05 NOTE — Telephone Encounter (Signed)
1.Medication Requested: ?mirabegron ER (MYRBETRIQ) 25 MG TB24 tablet ?gabapentin (NEURONTIN) 100 MG capsule ? ?2. Pharmacy (Name, Street, [place]): ?[place] ([facility] ) - [address] Phone:  [phone]  ?Fax:  [phone]  ?  ? ?3. On Med List: Y ? ?4. Last Visit with PCP:  ? ?5. Next visit date with PCP: ? ? ?Agent: Please be advised that RX refills may take up to 3 business days. We ask that you follow-up with your pharmacy.  ?

## 2022-02-06 ENCOUNTER — Other Ambulatory Visit: Payer: Self-pay

## 2022-02-06 MED ORDER — GABAPENTIN 100 MG PO CAPS: 100.0000 mg | ORAL_CAPSULE | Freq: Every day | ORAL | 3 refills | Status: DC

## 2022-02-06 MED ORDER — MIRABEGRON ER 25 MG PO TB24: 25.0000 mg | ORAL_TABLET | Freq: Every day | ORAL | 3 refills | Status: DC

## 2022-02-12 ENCOUNTER — Telehealth: Payer: Self-pay | Admitting: *Deleted

## 2022-02-12 DIAGNOSIS — R5383 Other fatigue: Secondary | ICD-10-CM

## 2022-02-12 DIAGNOSIS — G4733 Obstructive sleep apnea (adult) (pediatric): Secondary | ICD-10-CM

## 2022-02-12 DIAGNOSIS — G4734 Idiopathic sleep related nonobstructive alveolar hypoventilation: Secondary | ICD-10-CM

## 2022-02-12 NOTE — Telephone Encounter (Signed)
-----   Message from Lauralee Evener, [place] sent at 02/04/2022  9:45 AM EDT ----- ? ?----- Message ----- ?From: Sueanne Margarita, MD ?Sent: 01/31/2022  10:28 AM EDT ?To: Cv Div Sleep Studies ? ?Please let patient know that they have sleep apnea.  Recommend therapeutic CPAP titration ASAP for treatment of patient's sleep disordered breathing.  If unable to perform an in lab titration then initiate ResMed auto CPAP from 4 to 15cm H2O with heated humidity and mask of choice and overnight pulse ox on CPAP.    ? ?

## 2022-02-12 NOTE — Telephone Encounter (Signed)
The patient has been notified of the result and verbalized understanding.  All questions (if any) were answered. ?Beth Miller, [place] 02/12/2022 5:30 PM   ? ?Patient has declined the titration and states she already has a cpap but has not used it in years. She will discuss her decision with dr Johnsie Cancel at her next appointment. ? ?

## 2022-02-13 ENCOUNTER — Ambulatory Visit: Payer: Medicare Other

## 2022-02-13 DIAGNOSIS — Z7901 Long term (current) use of anticoagulants: Secondary | ICD-10-CM | POA: Diagnosis not present

## 2022-02-13 DIAGNOSIS — I4891 Unspecified atrial fibrillation: Secondary | ICD-10-CM | POA: Diagnosis not present

## 2022-02-13 LAB — POCT INR: INR: 1 — AB (ref 2.0–3.0)

## 2022-02-13 NOTE — Patient Instructions (Addendum)
Description   ?Take 2 tablets ('20mg'$ ) today and 2 tablets tomorrow ('20mg'$ ) and then continue on same dosage of Warfarin '10mg'$  (white tablet) daily.  ?Recheck INR in 1 week. Stay consistent with greens 4 servings a week of greens.  ?[facility] [phone].  ?  ?   ?

## 2022-02-18 ENCOUNTER — Other Ambulatory Visit: Payer: Self-pay | Admitting: Internal Medicine

## 2022-02-21 ENCOUNTER — Ambulatory Visit: Payer: Medicare Other

## 2022-02-21 DIAGNOSIS — Z7901 Long term (current) use of anticoagulants: Secondary | ICD-10-CM

## 2022-02-21 DIAGNOSIS — I4891 Unspecified atrial fibrillation: Secondary | ICD-10-CM | POA: Diagnosis not present

## 2022-02-21 LAB — POCT INR: INR: 3.1 — AB (ref 2.0–3.0)

## 2022-02-21 NOTE — Patient Instructions (Signed)
Description   ?Continue on same dosage of Warfarin '10mg'$  (white tablet) daily.  ?Recheck INR in 2 weeks.  ?Stay consistent with greens 4 servings a week of greens.  ?[facility] Clinic [phone].  ?  ?   ?

## 2022-02-26 ENCOUNTER — Ambulatory Visit (INDEPENDENT_AMBULATORY_CARE_PROVIDER_SITE_OTHER): Payer: Medicare Other | Admitting: Internal Medicine

## 2022-02-26 ENCOUNTER — Encounter: Payer: Self-pay | Admitting: Internal Medicine

## 2022-02-26 VITALS — BP 122/70 | HR 99 | Temp 98.0°F | Ht 62.0 in | Wt 195.0 lb

## 2022-02-26 DIAGNOSIS — R197 Diarrhea, unspecified: Secondary | ICD-10-CM | POA: Diagnosis not present

## 2022-02-26 DIAGNOSIS — N1831 Chronic kidney disease, stage 3a: Secondary | ICD-10-CM | POA: Diagnosis not present

## 2022-02-26 DIAGNOSIS — E1122 Type 2 diabetes mellitus with diabetic chronic kidney disease: Secondary | ICD-10-CM | POA: Diagnosis not present

## 2022-02-26 DIAGNOSIS — I4821 Permanent atrial fibrillation: Secondary | ICD-10-CM

## 2022-02-26 LAB — COMPREHENSIVE METABOLIC PANEL
ALT: 10 U/L (ref 0–35)
AST: 16 U/L (ref 0–37)
Albumin: 4.2 g/dL (ref 3.5–5.2)
Alkaline Phosphatase: 77 U/L (ref 39–117)
BUN: 13 mg/dL (ref 6–23)
CO2: 32 mEq/L (ref 19–32)
Calcium: 9.8 mg/dL (ref 8.4–10.5)
Chloride: 103 mEq/L (ref 96–112)
Creatinine, Ser: 1.32 mg/dL — ABNORMAL HIGH (ref 0.40–1.20)
GFR: 37.59 mL/min — ABNORMAL LOW (ref >60.00)
Glucose, Bld: 100 mg/dL — ABNORMAL HIGH (ref 70–99)
Potassium: 3.9 mEq/L (ref 3.5–5.1)
Sodium: 140 mEq/L (ref 135–145)
Total Bilirubin: 0.6 mg/dL (ref 0.2–1.2)
Total Protein: 7.5 g/dL (ref 6.0–8.3)

## 2022-02-26 LAB — CBC WITH DIFFERENTIAL/PLATELET
Basophils Absolute: 0 10*3/uL (ref 0.0–0.1)
Basophils Relative: 0.9 % (ref 0.0–3.0)
Eosinophils Absolute: 0.1 10*3/uL (ref 0.0–0.7)
Eosinophils Relative: 1.1 % (ref 0.0–5.0)
HCT: 33.9 % — ABNORMAL LOW (ref 36.0–46.0)
Hemoglobin: 11.2 g/dL — ABNORMAL LOW (ref 12.0–15.0)
Lymphocytes Relative: 30.5 % (ref 12.0–46.0)
Lymphs Abs: 1.5 10*3/uL (ref 0.7–4.0)
MCHC: 32.9 g/dL (ref 30.0–36.0)
MCV: 80.4 fl (ref 78.0–100.0)
Monocytes Absolute: 0.7 10*3/uL (ref 0.1–1.0)
Monocytes Relative: 14.5 % — ABNORMAL HIGH (ref 3.0–12.0)
Neutro Abs: 2.7 10*3/uL (ref 1.4–7.7)
Neutrophils Relative %: 53 % (ref 43.0–77.0)
Platelets: 271 10*3/uL (ref 150.0–400.0)
RBC: 4.22 Mil/uL (ref 3.87–5.11)
RDW: 17.3 % — ABNORMAL HIGH (ref 11.5–15.5)
WBC: 5 10*3/uL (ref 4.0–10.5)

## 2022-02-26 LAB — SEDIMENTATION RATE: Sed Rate: 28 mm/hr (ref 0–30)

## 2022-02-26 LAB — TSH: TSH: 2.42 u[IU]/mL (ref 0.35–5.50)

## 2022-02-26 LAB — HEMOGLOBIN A1C: Hgb A1c MFr Bld: 8.2 % — ABNORMAL HIGH (ref 4.6–6.5)

## 2022-02-26 MED ORDER — DIPHENOXYLATE-ATROPINE 2.5-0.025 MG PO TABS: 1.0000 | ORAL_TABLET | Freq: Four times a day (QID) | ORAL | 0 refills | Status: DC | PRN

## 2022-02-26 MED ORDER — PANCRELIPASE (LIP-PROT-AMYL) 36000-114000 UNITS PO CPEP: 36000.0000 [IU] | ORAL_CAPSULE | Freq: Three times a day (TID) | ORAL | 3 refills | Status: DC

## 2022-02-26 NOTE — Assessment & Plan Note (Signed)
Cartia, Digoxin d/c, Coumadin, Toprol ?

## 2022-02-26 NOTE — Assessment & Plan Note (Signed)
Continue to monitor GFR ?

## 2022-02-26 NOTE — Progress Notes (Signed)
Subjective:  Patient ID: Beth Miller, female    DOB: [birth year]  Age: 81 y.o. MRN: [id number]  CC: Follow-up (Bloating and stomach discomfort)   HPI Beth Miller presents for chronic diarrhea QOD (stools are 3/a day, near-accidents), not better off Metformin, w/o milk. H/o cholecystectomy... C/o GERD, burps; DM,   Outpatient Medications Prior to Visit  Medication Sig Dispense Refill   Accu-Chek Softclix Lancets lancets 1 each by Other route 2 (two) times daily. Use to check blood sugars twice a day 200 each 3   acetaminophen (TYLENOL) 500 MG tablet Take 1,000 mg by mouth every 6 (six) hours as needed for mild pain.     Alcohol Swabs (B-D SINGLE USE SWABS REGULAR) PADS 1 each by Does not apply route 2 (two) times daily. Use to clean finger to check blood sugars twice a day 200 each 3   amoxicillin (AMOXIL) 500 MG tablet Take four capsules (2,000 mg) one hour prior to all dental visits. 8 tablet 11   Blood Glucose Calibration (ACCU-CHEK AVIVA) SOLN 1 each by In Vitro route as directed. 3 each 1   Cholecalciferol (VITAMIN D3) 1000 UNIT tablet Take 1,000 Units by mouth daily.     cyclobenzaprine (FLEXERIL) 10 MG tablet take 1/2 to 1 TABLET BY MOUTH THREE TIMES DAILY AS NEEDED FOR MUSCLE SPASMS 90 tablet 1   diltiazem (CARDIZEM CD) 240 MG 24 hr capsule Take 1 capsule (240 mg total) by mouth daily. 90 capsule 3   furosemide (LASIX) 40 MG tablet Take 1 tablet (40 mg total) by mouth daily. 90 tablet 3   gabapentin (NEURONTIN) 100 MG capsule Take 1 capsule (100 mg total) by mouth at bedtime. 90 capsule 3   glucose blood (ACCU-CHEK AVIVA PLUS) test strip TEST BLOOD SUGAR TWICE DAILY 200 strip 3   Lancet Devices (ACCU-CHEK SOFTCLIX) lancets 1 each by Other route 2 (two) times daily. Use as instructed 1 each 5   loratadine (CLARITIN) 10 MG tablet Take 1 tablet (10 mg total) by mouth daily as needed for allergies. Take in am 100 tablet 3   memantine (NAMENDA) 5 MG tablet Take 1 tablet (5 mg  total) by mouth 2 (two) times daily. 180 tablet 3   metoprolol succinate (TOPROL XL) 50 MG 24 hr tablet Take 1 tablet (50 mg total) by mouth daily. 90 tablet 3   mirabegron ER (MYRBETRIQ) 25 MG TB24 tablet TAKE ONE TABLET BY MOUTH DAILY 90 tablet 1   oxyCODONE (OXY IR/ROXICODONE) 5 MG immediate release tablet Take 1 tablet (5 mg total) by mouth every 6 (six) hours as needed for severe pain. 20 tablet 0   pantoprazole (PROTONIX) 40 MG tablet Take 1 tablet (40 mg total) by mouth daily. Take in am 90 tablet 3   potassium chloride (KLOR-CON M) 10 MEQ tablet Take 1 tablet (10 mEq total) by mouth 2 (two) times daily. 180 tablet 3   triamcinolone cream (KENALOG) 0.5 % Apply 1 application topically 3 (three) times daily as needed. 60 g 1   vitamin B-12 (CYANOCOBALAMIN) 1000 MCG tablet Take 1 tablet (1,000 mcg total) by mouth daily. 90 tablet 3   warfarin (COUMADIN) 10 MG tablet TAKE ONE TABLET BY MOUTH daily AS DIRECTED 30 tablet 0   metFORMIN (GLUCOPHAGE) 500 MG tablet Take 1 tablet (500 mg total) by mouth daily with breakfast. 90 tablet 3   No facility-administered medications prior to visit.    ROS: Review of Systems  Constitutional:  Positive for fatigue. Negative  for activity change, appetite change, chills and unexpected weight change.  HENT:  Negative for congestion, mouth sores and sinus pressure.   Eyes:  Negative for visual disturbance.  Respiratory:  Negative for cough and chest tightness.   Gastrointestinal:  Positive for abdominal distention and diarrhea. Negative for abdominal pain, blood in stool, nausea and vomiting.  Genitourinary:  Negative for difficulty urinating, frequency and vaginal pain.  Musculoskeletal:  Positive for arthralgias. Negative for back pain and gait problem.  Skin:  Negative for pallor and rash.  Neurological:  Negative for dizziness, tremors, weakness, numbness and headaches.  Psychiatric/Behavioral:  Negative for confusion and sleep disturbance.     Objective:  BP 122/70 (BP Location: Left Arm, Patient Position: Sitting, Cuff Size: Large)   Pulse 99   Temp 98 F (36.7 C) (Oral)   Ht '5\' 2"'$  (1.575 m)   Wt 195 lb (88.5 kg)   LMP  (LMP Unknown)   SpO2 95%   BMI 35.67 kg/m   BP Readings from Last 3 Encounters:  02/26/22 122/70  01/22/22 112/72  11/15/21 112/72    Wt Readings from Last 3 Encounters:  02/26/22 195 lb (88.5 kg)  01/31/22 197 lb (89.4 kg)  01/22/22 197 lb 12.8 oz (89.7 kg)    Physical Exam Constitutional:      General: She is not in acute distress.    Appearance: She is well-developed. She is obese.  HENT:     Head: Normocephalic.     Right Ear: External ear normal.     Left Ear: External ear normal.     Nose: Nose normal.  Eyes:     General:        Right eye: No discharge.        Left eye: No discharge.     Conjunctiva/sclera: Conjunctivae normal.     Pupils: Pupils are equal, round, and reactive to light.  Neck:     Thyroid: No thyromegaly.     Vascular: No JVD.     Trachea: No tracheal deviation.  Cardiovascular:     Rate and Rhythm: Normal rate and regular rhythm.     Heart sounds: Normal heart sounds.  Pulmonary:     Effort: No respiratory distress.     Breath sounds: No stridor. No wheezing.  Abdominal:     General: Bowel sounds are normal. There is no distension.     Palpations: Abdomen is soft. There is no mass.     Tenderness: There is no abdominal tenderness. There is no guarding or rebound.  Musculoskeletal:        General: No tenderness.     Cervical back: Normal range of motion and neck supple. No rigidity.  Lymphadenopathy:     Cervical: No cervical adenopathy.  Skin:    Findings: No erythema or rash.  Neurological:     Cranial Nerves: No cranial nerve deficit.     Motor: No abnormal muscle tone.     Coordination: Coordination normal.     Deep Tendon Reflexes: Reflexes normal.  Psychiatric:        Behavior: Behavior normal.        Thought Content: Thought content  normal.        Judgment: Judgment normal.  Valve click    A total time of 45 minutes was spent preparing to see the patient, reviewing tests, x-rays, operative reports and other medical records.  Also, obtaining history and performing comprehensive physical exam.  Additionally, counseling the patient regarding the above listed  issues.   Finally, documenting clinical information in the health records, coordination of care, educating the patient re diarrhea, DM, anticoagulation. It is a complex case.   Lab Results  Component Value Date   WBC 5.0 11/15/2021   HGB 11.0 (L) 11/15/2021   HCT 32.2 (L) 11/15/2021   PLT 263 11/15/2021   GLUCOSE 200 (H) 11/15/2021   CHOL 116 01/22/2022   TRIG 92 01/22/2022   HDL 47 01/22/2022   LDLCALC 51 01/22/2022   ALT 11 01/22/2022   AST 18 01/22/2022   NA 142 11/15/2021   K 4.3 11/15/2021   CL 103 11/15/2021   CREATININE 1.17 (H) 11/15/2021   BUN 24 11/15/2021   CO2 23 11/15/2021   TSH 3.56 01/30/2021   INR 3.1 (A) 02/21/2022   HGBA1C 6.9 (H) 04/04/2021    MR Lumbar Spine W Wo Contrast  Result Date: 02/14/2021 CLINICAL DATA:  Low back pain EXAM: MRI LUMBAR SPINE WITHOUT AND WITH CONTRAST TECHNIQUE: Multiplanar and multiecho pulse sequences of the lumbar spine were obtained without and with intravenous contrast. CONTRAST:  53m GADAVIST GADOBUTROL 1 MMOL/ML IV SOLN COMPARISON:  Pain FINDINGS: Segmentation:  Standard. Alignment:  Grade 1 anterolisthesis at L4-5 Vertebrae:  No fracture, evidence of discitis, or bone lesion. Conus medullaris and cauda equina: Conus extends to the L1 level. Conus and cauda equina appear normal. Paraspinal and other soft tissues: Negative Disc levels: The disc levels above L4 are unremarkable. L4-5: Small disc bulge with severe facet hypertrophy. Severe spinal canal stenosis and severe left foraminal stenosis. L5-S1: Severe facet arthrosis and mild disc bulge. No spinal canal stenosis. Mild bilateral neural foraminal stenosis.  IMPRESSION: 1. Severe spinal canal stenosis and left foraminal stenosis at L4-L5 due to combination of disc bulge and severe facet arthrosis. 2. Severe facet arthrosis at L5-S1 with mild bilateral neural foraminal stenosis. Electronically Signed   By: KUlyses JarredM.D.   On: 02/14/2021 22:15   DG Chest Port 1 View  Result Date: 02/14/2021 CLINICAL DATA:  Sepsis, back pain EXAM: PORTABLE CHEST 1 VIEW COMPARISON:  02/05/2021 FINDINGS: Prior CABG. Cardiomegaly. Vascular congestion. No overt edema, confluent opacities or effusions. No acute bony abnormality. IMPRESSION: Cardiomegaly, vascular congestion. Electronically Signed   By: KRolm BaptiseM.D.   On: 02/14/2021 19:58   VAS [place]OWER EXTREMITY VENOUS (DVT)  Result Date: 02/15/2021  Lower Venous DVT Study Indications: Fever of unknown origin.  Comparison Study: No previous exams Performing Technologist: JRogelia Rohrer Examination Guidelines: A complete evaluation includes B-mode imaging, spectral Doppler, color Doppler, and power Doppler as needed of all accessible portions of each vessel. Bilateral testing is considered an integral part of a complete examination. Limited examinations for reoccurring indications may be performed as noted. The reflux portion of the exam is performed with the patient in reverse Trendelenburg.  +---------+---------------+---------+-----------+----------+--------------+ RIGHT    CompressibilityPhasicitySpontaneityPropertiesThrombus Aging +---------+---------------+---------+-----------+----------+--------------+ CFV      Full           Yes      Yes                                 +---------+---------------+---------+-----------+----------+--------------+ SFJ      Full                                                        +---------+---------------+---------+-----------+----------+--------------+  FV Prox  Full           Yes      Yes                                  +---------+---------------+---------+-----------+----------+--------------+ FV Mid   Full           Yes      Yes                                 +---------+---------------+---------+-----------+----------+--------------+ FV DistalFull           Yes      Yes                                 +---------+---------------+---------+-----------+----------+--------------+ PFV      Full                                                        +---------+---------------+---------+-----------+----------+--------------+ POP      Full           Yes      Yes                                 +---------+---------------+---------+-----------+----------+--------------+ PTV      Full                                                        +---------+---------------+---------+-----------+----------+--------------+ PERO     Full                                                        +---------+---------------+---------+-----------+----------+--------------+   +---------+---------------+---------+-----------+----------+--------------+ LEFT     CompressibilityPhasicitySpontaneityPropertiesThrombus Aging +---------+---------------+---------+-----------+----------+--------------+ CFV      Full           Yes      Yes                                 +---------+---------------+---------+-----------+----------+--------------+ SFJ      Full                                                        +---------+---------------+---------+-----------+----------+--------------+ FV Prox  Full           Yes      Yes                                 +---------+---------------+---------+-----------+----------+--------------+ FV Mid   Full  Yes      Yes                                 +---------+---------------+---------+-----------+----------+--------------+ FV DistalFull           Yes      Yes                                  +---------+---------------+---------+-----------+----------+--------------+ PFV      Full                                                        +---------+---------------+---------+-----------+----------+--------------+ POP      Full           Yes      Yes                                 +---------+---------------+---------+-----------+----------+--------------+ PTV      Full                                                        +---------+---------------+---------+-----------+----------+--------------+ PERO     Full                                                        +---------+---------------+---------+-----------+----------+--------------+     Summary: BILATERAL: - No evidence of deep vein thrombosis seen in the lower extremities, bilaterally. - No evidence of superficial venous thrombosis in the lower extremities, bilaterally. -No evidence of popliteal cyst, bilaterally.   *See table(s) above for measurements and observations. Electronically signed by Harold Barban MD on 02/15/2021 at 9:25:19 PM.    Final    VAS [place] UPPER EXTREMITY VENOUS DUPLEX  Result Date: 02/15/2021 UPPER VENOUS STUDY  Indications: fever Comparison Study: no prior Performing Technologist: Abram Sander RVS  Examination Guidelines: A complete evaluation includes B-mode imaging, spectral Doppler, color Doppler, and power Doppler as needed of all accessible portions of each vessel. Bilateral testing is considered an integral part of a complete examination. Limited examinations for reoccurring indications may be performed as noted.  Right Findings: +----------+------------+---------+-----------+----------+-------+ RIGHT     CompressiblePhasicitySpontaneousPropertiesSummary +----------+------------+---------+-----------+----------+-------+ IJV           Full       Yes       Yes                      +----------+------------+---------+-----------+----------+-------+ Subclavian    Full       Yes        Yes                      +----------+------------+---------+-----------+----------+-------+ Axillary      Full       Yes       Yes                      +----------+------------+---------+-----------+----------+-------+  Brachial      Full       Yes       Yes                      +----------+------------+---------+-----------+----------+-------+ Radial        Full                                          +----------+------------+---------+-----------+----------+-------+ Ulnar         Full                                          +----------+------------+---------+-----------+----------+-------+ Cephalic      Full                                          +----------+------------+---------+-----------+----------+-------+ Basilic       Full                                          +----------+------------+---------+-----------+----------+-------+  Left Findings: +----------+------------+---------+-----------+----------+-------+ LEFT      CompressiblePhasicitySpontaneousPropertiesSummary +----------+------------+---------+-----------+----------+-------+ IJV           Full       Yes       Yes                      +----------+------------+---------+-----------+----------+-------+ Subclavian    Full       Yes       Yes                      +----------+------------+---------+-----------+----------+-------+ Axillary      Full       Yes       Yes                      +----------+------------+---------+-----------+----------+-------+ Brachial      Full       Yes       Yes                      +----------+------------+---------+-----------+----------+-------+ Radial        Full                                          +----------+------------+---------+-----------+----------+-------+ Ulnar         Full                                          +----------+------------+---------+-----------+----------+-------+ Cephalic      Full                                           +----------+------------+---------+-----------+----------+-------+ Basilic       Full                                          +----------+------------+---------+-----------+----------+-------+  Summary: No evidence of deep vein or superficial vein thrombosis involving the right and left upper extremities.  *See table(s) above for measurements and observations.  Diagnosing physician: Harold Barban MD Electronically signed by Harold Barban MD on 02/15/2021 at 9:25:47 PM.    Final     Assessment & Plan:   Problem List Items Addressed This Visit     Diarrhea - Primary   Relevant Orders   Comprehensive metabolic panel   CBC with Differential/Platelet   Sedimentation rate   Giardia/cryptosporidium (EIA)   Clostridium difficile EIA   Ambulatory referral to Gastroenterology   DM2 (diabetes mellitus, type 2) ([place])    We stopped Metformin due to loose stool  Prandin tid ac - not taking - re-start Prandin tid ac  Freestyle Libre - pt never got it      Relevant Orders   Giardia/cryptosporidium (EIA)   Clostridium difficile EIA   TSH   Hemoglobin A1c   Atrial fibrillation (HCC)    Cartia, Digoxin d/c, Coumadin, Toprol      Relevant Orders   TSH   Chronic kidney disease, stage 3a ([place])    Continue to monitor GFR       Relevant Orders   TSH      Meds ordered this encounter  Medications   lipase/protease/amylase (CREON) 36000 UNITS CPEP capsule    Sig: Take 1 capsule (36,000 Units total) by mouth 3 (three) times daily before meals. Take with the first bite of food    Dispense:  180 capsule    Refill:  3   diphenoxylate-atropine (LOMOTIL) 2.5-0.025 MG tablet    Sig: Take 1 tablet by mouth 4 (four) times daily as needed for diarrhea or loose stools.    Dispense:  60 tablet    Refill:  0      Follow-up: Return in about 6 weeks (around 04/09/2022) for a follow-up visit.  Walker Kehr, MD

## 2022-02-26 NOTE — Assessment & Plan Note (Addendum)
We stopped Metformin due to loose stool  ?Prandin tid ac - not taking - re-start ?Prandin tid ac  ?Freestyle Libre - pt never got it ?

## 2022-02-27 ENCOUNTER — Other Ambulatory Visit (HOSPITAL_COMMUNITY): Payer: Self-pay

## 2022-03-04 ENCOUNTER — Other Ambulatory Visit: Payer: Self-pay | Admitting: Internal Medicine

## 2022-03-04 DIAGNOSIS — R197 Diarrhea, unspecified: Secondary | ICD-10-CM | POA: Diagnosis not present

## 2022-03-04 DIAGNOSIS — N1831 Chronic kidney disease, stage 3a: Secondary | ICD-10-CM | POA: Diagnosis not present

## 2022-03-04 DIAGNOSIS — E1122 Type 2 diabetes mellitus with diabetic chronic kidney disease: Secondary | ICD-10-CM | POA: Diagnosis not present

## 2022-03-05 LAB — GIARDIA AND CRYPTOSPORIDIUM ANTIGEN PANEL: Result:: NOT DETECTED

## 2022-03-06 LAB — CLOSTRIDIUM DIFFICILE EIA: C difficile Toxins A+B, EIA: NEGATIVE

## 2022-03-11 ENCOUNTER — Ambulatory Visit: Payer: Medicare Other

## 2022-03-11 ENCOUNTER — Other Ambulatory Visit: Payer: Self-pay

## 2022-03-11 DIAGNOSIS — I4821 Permanent atrial fibrillation: Secondary | ICD-10-CM

## 2022-03-11 DIAGNOSIS — Z7901 Long term (current) use of anticoagulants: Secondary | ICD-10-CM

## 2022-03-11 DIAGNOSIS — Z9889 Other specified postprocedural states: Secondary | ICD-10-CM

## 2022-03-11 DIAGNOSIS — I4891 Unspecified atrial fibrillation: Secondary | ICD-10-CM

## 2022-03-11 LAB — POCT INR: INR: 2.2 (ref 2.0–3.0)

## 2022-03-11 MED ORDER — WARFARIN SODIUM 5 MG PO TABS: ORAL_TABLET | ORAL | 1 refills | Status: DC

## 2022-03-11 MED ORDER — WARFARIN SODIUM 10 MG PO TABS: 10.0000 mg | ORAL_TABLET | Freq: Every day | ORAL | 0 refills | Status: DC

## 2022-03-11 MED ORDER — WARFARIN SODIUM 10 MG PO TABS: ORAL_TABLET | ORAL | 1 refills | Status: DC

## 2022-03-11 MED ORDER — WARFARIN SODIUM 5 MG PO TABS: ORAL_TABLET | ORAL | 0 refills | Status: DC

## 2022-03-11 NOTE — Patient Instructions (Signed)
Description   ?Take 1.5 tablets today and then continue on same dosage of Warfarin '10mg'$  (white tablet) daily.  ?Recheck INR in 2 weeks.  ?Stay consistent with greens 3 servings a week of greens.  ?Coumadin Clinic [phone].  ?  ?   ?

## 2022-03-11 NOTE — Telephone Encounter (Signed)
Prescription refill request received for warfarin ?Lov: 01/22/22 ([place])  ?Next INR check: 03/25/22 ?Warfarin tablet strength: '5mg'$  & '10mg'$  ? ?Pt came to anticoagulation clinic and requested Warfarin refill. Pt currently takes '10mg'$  daily; however, depending on INR results, she is instructed to take '15mg'$  periodically. Pt stated she has difficulty cutting tablets in half and requested a prescription for '5mg'$  tablets to be sent to pharmacy as well for when she instructed to take '15mg'$ .  ? ?Pt stated she will be out of medication today and needs small supply sent to local pharmacy but the 90 day supply sent to [facility] to be mailed.  ? ?Called My Pharmacy and confirmed prescription can be filled for pt to come pick up today.  ?

## 2022-04-02 ENCOUNTER — Ambulatory Visit: Payer: Medicare Other

## 2022-04-02 ENCOUNTER — Other Ambulatory Visit: Payer: Self-pay | Admitting: *Deleted

## 2022-04-02 ENCOUNTER — Telehealth: Payer: Self-pay | Admitting: Cardiovascular Disease

## 2022-04-02 DIAGNOSIS — Z7901 Long term (current) use of anticoagulants: Secondary | ICD-10-CM

## 2022-04-02 DIAGNOSIS — I4891 Unspecified atrial fibrillation: Secondary | ICD-10-CM

## 2022-04-02 DIAGNOSIS — Z5181 Encounter for therapeutic drug level monitoring: Secondary | ICD-10-CM

## 2022-04-02 DIAGNOSIS — Z9889 Other specified postprocedural states: Secondary | ICD-10-CM

## 2022-04-02 DIAGNOSIS — I4821 Permanent atrial fibrillation: Secondary | ICD-10-CM

## 2022-04-02 LAB — POCT INR: INR: 4 — AB (ref 2.0–3.0)

## 2022-04-02 MED ORDER — DILTIAZEM HCL ER COATED BEADS 240 MG PO CP24: 240.0000 mg | ORAL_CAPSULE | Freq: Every day | ORAL | 3 refills | Status: DC

## 2022-04-02 MED ORDER — DILTIAZEM HCL ER COATED BEADS 240 MG PO CP24: 240.0000 mg | ORAL_CAPSULE | Freq: Every day | ORAL | 0 refills | Status: DC

## 2022-04-02 MED ORDER — WARFARIN SODIUM 5 MG PO TABS: ORAL_TABLET | ORAL | 1 refills | Status: DC

## 2022-04-02 MED ORDER — WARFARIN SODIUM 10 MG PO TABS: ORAL_TABLET | ORAL | 1 refills | Status: DC

## 2022-04-02 NOTE — Telephone Encounter (Signed)
Patient calling the office for samples of medication:   1.  What medication and dosage are you requesting samples for?  Cardizem   2.  Are you currently out of this medication?   Yes for three days.  Pt's pharmacy cannot refill medication because [facility] has already refilled the full prescription.

## 2022-04-02 NOTE — Telephone Encounter (Signed)
The patient is out of her diltiazem.  This morning she requested a refill.  A full 90 day supply was sent to her mail order pharmacy, so she has none until that arrives in the mail. Her pharmacy which has closed for the day told her to request samples from our office.  I sent her a short term supply to [facility] and made her aware she may have to pay cash price for this.  Pt appreciative for this assistance and will call back if needs anything further.

## 2022-04-02 NOTE — Telephone Encounter (Signed)
Pt's medication was sent to pt's pharmacy as requested. Confirmation received.  °

## 2022-04-02 NOTE — Telephone Encounter (Signed)
*  STAT* If patient is at the pharmacy, call can be transferred to refill team.   1. Which medications need to be refilled? (please list name of each medication and dose if known) diltiazem (CARDIZEM CD) 240 MG 24 hr capsule  2. Which pharmacy/location (including street and city if local pharmacy) is medication to be sent to? My [place], [place] [facility].  3. Do they need a 30 day or 90 day supply? 90   Patient is out of medication

## 2022-04-02 NOTE — Patient Instructions (Signed)
HOLD TONIGHT ONLY  and then continue on same dosage of Warfarin '10mg'$  (white tablet) daily.  Recheck INR in 2 weeks.  Stay consistent with greens 3 servings a week of greens.  [facility] [phone].

## 2022-04-04 ENCOUNTER — Other Ambulatory Visit: Payer: Self-pay | Admitting: Internal Medicine

## 2022-04-09 ENCOUNTER — Encounter: Payer: Self-pay | Admitting: Internal Medicine

## 2022-04-09 ENCOUNTER — Ambulatory Visit (INDEPENDENT_AMBULATORY_CARE_PROVIDER_SITE_OTHER): Payer: Medicare Other | Admitting: Internal Medicine

## 2022-04-09 DIAGNOSIS — R001 Bradycardia, unspecified: Secondary | ICD-10-CM

## 2022-04-09 DIAGNOSIS — N1831 Chronic kidney disease, stage 3a: Secondary | ICD-10-CM

## 2022-04-09 DIAGNOSIS — E1122 Type 2 diabetes mellitus with diabetic chronic kidney disease: Secondary | ICD-10-CM | POA: Diagnosis not present

## 2022-04-09 DIAGNOSIS — R197 Diarrhea, unspecified: Secondary | ICD-10-CM

## 2022-04-09 MED ORDER — ACCU-CHEK AVIVA PLUS VI STRP: ORAL_STRIP | 3 refills | Status: DC

## 2022-04-09 MED ORDER — METOPROLOL SUCCINATE ER 50 MG PO TB24
25.0000 mg | ORAL_TABLET | Freq: Every day | ORAL | 3 refills | Status: DC
Start: 2022-04-09 — End: 2022-07-23

## 2022-04-09 MED ORDER — FAMOTIDINE 40 MG PO TABS: 40.0000 mg | ORAL_TABLET | Freq: Every day | ORAL | 3 refills | Status: AC

## 2022-04-09 NOTE — Progress Notes (Signed)
Subjective:  Patient ID: Beth Miller, female    DOB: December 25, 1940  Age: 81 y.o. MRN: [id number]  CC: No chief complaint on file.   HPI Beth Miller presents for upset stomach - a little better off metformin. Creon did not help. C/o GERD at night F/u on DM, HTN.   Outpatient Medications Prior to Visit  Medication Sig Dispense Refill   Accu-Chek Softclix Lancets lancets 1 each by Other route 2 (two) times daily. Use to check blood sugars twice a day 200 each 3   acetaminophen (TYLENOL) 500 MG tablet Take 1,000 mg by mouth every 6 (six) hours as needed for mild pain.     Alcohol Swabs (B-D SINGLE USE SWABS REGULAR) PADS 1 each by Does not apply route 2 (two) times daily. Use to clean finger to check blood sugars twice a day 200 each 3   amoxicillin (AMOXIL) 500 MG tablet Take four capsules (2,000 mg) one hour prior to all dental visits. 8 tablet 11   Cholecalciferol (VITAMIN D3) 1000 UNIT tablet Take 1,000 Units by mouth daily.     cyclobenzaprine (FLEXERIL) 10 MG tablet take 1/2 to 1 TABLET BY MOUTH THREE TIMES DAILY AS NEEDED FOR MUSCLE SPASMS 90 tablet 1   diltiazem (CARDIZEM CD) 240 MG 24 hr capsule Take 1 capsule (240 mg total) by mouth daily. 14 capsule 0   furosemide (LASIX) 20 MG tablet TAKE ONE TABLET BY MOUTH EVERY MORNING 90 tablet 1   furosemide (LASIX) 40 MG tablet Take 1 tablet (40 mg total) by mouth daily. 90 tablet 3   gabapentin (NEURONTIN) 100 MG capsule Take 1 capsule (100 mg total) by mouth at bedtime. 90 capsule 3   Lancet Devices (ACCU-CHEK SOFTCLIX) lancets 1 each by Other route 2 (two) times daily. Use as instructed 1 each 5   loratadine (ALLERGY RELIEF) 10 MG tablet TAKE ONE TABLET BY MOUTH EVERY DAY 30 tablet 11   memantine (NAMENDA) 5 MG tablet Take 1 tablet (5 mg total) by mouth 2 (two) times daily. 180 tablet 3   mirabegron ER (MYRBETRIQ) 25 MG TB24 tablet TAKE ONE TABLET BY MOUTH DAILY 90 tablet 1   pantoprazole (PROTONIX) 40 MG tablet TAKE ONE TABLET BY  MOUTH BY MOUTH EVERY MORNING 90 tablet 1   potassium chloride (KLOR-CON M) 10 MEQ tablet Take 1 tablet (10 mEq total) by mouth 2 (two) times daily. 180 tablet 3   vitamin B-12 (CYANOCOBALAMIN) 1000 MCG tablet Take 1 tablet (1,000 mcg total) by mouth daily. 90 tablet 3   warfarin (COUMADIN) 10 MG tablet Take 1 tablet by mouth daily or as directed by [facility] 90 tablet 1   warfarin (COUMADIN) 5 MG tablet Take 0.5 to 1 tablet by mouth daily as directed by [facility] Clinic 20 tablet 1   Blood Glucose Calibration (ACCU-CHEK AVIVA) SOLN 1 each by In Vitro route as directed. 3 each 1   diphenoxylate-atropine (LOMOTIL) 2.5-0.025 MG tablet Take 1 tablet by mouth 4 (four) times daily as needed for diarrhea or loose stools. 60 tablet 0   glucose blood (ACCU-CHEK AVIVA PLUS) test strip TEST BLOOD SUGAR TWICE DAILY 200 strip 3   lipase/protease/amylase (CREON) 36000 UNITS CPEP capsule Take 1 capsule (36,000 Units total) by mouth 3 (three) times daily before meals. Take with the first bite of food 180 capsule 3   metFORMIN (GLUCOPHAGE) 500 MG tablet TAKE ONE TABLET BY MOUTH WITH BREAKFAST 90 tablet 1   metoprolol succinate (TOPROL XL) 50 MG  24 hr tablet Take 1 tablet (50 mg total) by mouth daily. 90 tablet 3   oxyCODONE (OXY IR/ROXICODONE) 5 MG immediate release tablet Take 1 tablet (5 mg total) by mouth every 6 (six) hours as needed for severe pain. 20 tablet 0   potassium chloride (KLOR-CON) 10 MEQ tablet TAKE ONE TABLET BY MOUTH DAILY 90 tablet 1   No facility-administered medications prior to visit.    ROS: Review of Systems  Constitutional:  Positive for fatigue. Negative for activity change, appetite change, chills and unexpected weight change.  HENT:  Negative for congestion, mouth sores and sinus pressure.   Eyes:  Negative for visual disturbance.  Respiratory:  Positive for shortness of breath and wheezing. Negative for cough and chest tightness.   Cardiovascular:  Negative for leg swelling.   Gastrointestinal:  Positive for diarrhea and nausea. Negative for abdominal pain.  Genitourinary:  Negative for difficulty urinating, frequency and vaginal pain.  Musculoskeletal:  Negative for back pain and gait problem.  Skin:  Negative for pallor and rash.  Neurological:  Negative for dizziness, tremors, weakness, numbness and headaches.  Psychiatric/Behavioral:  Negative for confusion and sleep disturbance. The patient is not nervous/anxious.     Objective:  BP (!) 90/52 (BP Location: Left Arm, Patient Position: Sitting, Cuff Size: Large)   Pulse (!) 37   Temp 98.4 F (36.9 C) (Oral)   Ht '5\' 2"'$  (1.575 m)   Wt 196 lb (88.9 kg)   LMP  (LMP Unknown)   SpO2 92%   BMI 35.85 kg/m   BP Readings from Last 3 Encounters:  04/09/22 (!) 90/52  02/26/22 122/70  01/22/22 112/72    Wt Readings from Last 3 Encounters:  04/09/22 196 lb (88.9 kg)  02/26/22 195 lb (88.5 kg)  01/31/22 197 lb (89.4 kg)    Physical Exam Constitutional:      General: She is not in acute distress.    Appearance: She is well-developed. She is obese.  HENT:     Head: Normocephalic.     Right Ear: External ear normal.     Left Ear: External ear normal.     Nose: Nose normal.  Eyes:     General:        Right eye: No discharge.        Left eye: No discharge.     Conjunctiva/sclera: Conjunctivae normal.     Pupils: Pupils are equal, round, and reactive to light.  Neck:     Thyroid: No thyromegaly.     Vascular: No JVD.     Trachea: No tracheal deviation.  Cardiovascular:     Rate and Rhythm: Regular rhythm. Bradycardia present.     Heart sounds: Normal heart sounds.  Pulmonary:     Effort: No respiratory distress.     Breath sounds: No stridor. No wheezing.  Abdominal:     General: Bowel sounds are normal. There is no distension.     Palpations: Abdomen is soft. There is no mass.     Tenderness: There is no abdominal tenderness. There is no guarding or rebound.  Musculoskeletal:        General:  No tenderness.     Cervical back: Normal range of motion and neck supple. No rigidity.  Lymphadenopathy:     Cervical: No cervical adenopathy.  Skin:    Findings: No erythema or rash.  Neurological:     Cranial Nerves: No cranial nerve deficit.     Motor: No abnormal muscle tone.  Coordination: Coordination normal.     Deep Tendon Reflexes: Reflexes normal.  Psychiatric:        Behavior: Behavior normal.        Thought Content: Thought content normal.        Judgment: Judgment normal.     Lab Results  Component Value Date   WBC 5.0 02/26/2022   HGB 11.2 (L) 02/26/2022   HCT 33.9 (L) 02/26/2022   PLT 271.0 02/26/2022   GLUCOSE 100 (H) 02/26/2022   CHOL 116 01/22/2022   TRIG 92 01/22/2022   HDL 47 01/22/2022   LDLCALC 51 01/22/2022   ALT 10 02/26/2022   AST 16 02/26/2022   NA 140 02/26/2022   K 3.9 02/26/2022   CL 103 02/26/2022   CREATININE 1.32 (H) 02/26/2022   BUN 13 02/26/2022   CO2 32 02/26/2022   TSH 2.42 02/26/2022   INR 4.0 (A) 04/02/2022   HGBA1C 8.2 (H) 02/26/2022    VAS [place] UPPER EXTREMITY VENOUS DUPLEX  Result Date: 02/15/2021 UPPER VENOUS STUDY  Indications: fever Comparison Study: no prior Performing Technologist: Abram Sander RVS  Examination Guidelines: A complete evaluation includes B-mode imaging, spectral Doppler, color Doppler, and power Doppler as needed of all accessible portions of each vessel. Bilateral testing is considered an integral part of a complete examination. Limited examinations for reoccurring indications may be performed as noted.  Right Findings: +----------+------------+---------+-----------+----------+-------+ RIGHT     CompressiblePhasicitySpontaneousPropertiesSummary +----------+------------+---------+-----------+----------+-------+ IJV           Full       Yes       Yes                      +----------+------------+---------+-----------+----------+-------+ Subclavian    Full       Yes       Yes                       +----------+------------+---------+-----------+----------+-------+ Axillary      Full       Yes       Yes                      +----------+------------+---------+-----------+----------+-------+ Brachial      Full       Yes       Yes                      +----------+------------+---------+-----------+----------+-------+ Radial        Full                                          +----------+------------+---------+-----------+----------+-------+ Ulnar         Full                                          +----------+------------+---------+-----------+----------+-------+ Cephalic      Full                                          +----------+------------+---------+-----------+----------+-------+ Basilic       Full                                          +----------+------------+---------+-----------+----------+-------+  Left Findings: +----------+------------+---------+-----------+----------+-------+ LEFT      CompressiblePhasicitySpontaneousPropertiesSummary +----------+------------+---------+-----------+----------+-------+ IJV           Full       Yes       Yes                      +----------+------------+---------+-----------+----------+-------+ Subclavian    Full       Yes       Yes                      +----------+------------+---------+-----------+----------+-------+ Axillary      Full       Yes       Yes                      +----------+------------+---------+-----------+----------+-------+ Brachial      Full       Yes       Yes                      +----------+------------+---------+-----------+----------+-------+ Radial        Full                                          +----------+------------+---------+-----------+----------+-------+ Ulnar         Full                                          +----------+------------+---------+-----------+----------+-------+ Cephalic      Full                                           +----------+------------+---------+-----------+----------+-------+ Basilic       Full                                          +----------+------------+---------+-----------+----------+-------+  Summary: No evidence of deep vein or superficial vein thrombosis involving the right and left upper extremities.  *See table(s) above for measurements and observations.  Diagnosing physician: Harold Barban MD Electronically signed by Harold Barban MD on 02/15/2021 at 9:25:47 PM.    Final    VAS [place] LOWER EXTREMITY VENOUS (DVT)  Result Date: 02/15/2021  Lower Venous DVT Study Indications: Fever of unknown origin.  Comparison Study: No previous exams Performing Technologist: Rogelia Rohrer  Examination Guidelines: A complete evaluation includes B-mode imaging, spectral Doppler, color Doppler, and power Doppler as needed of all accessible portions of each vessel. Bilateral testing is considered an integral part of a complete examination. Limited examinations for reoccurring indications may be performed as noted. The reflux portion of the exam is performed with the patient in reverse Trendelenburg.  +---------+---------------+---------+-----------+----------+--------------+ RIGHT    CompressibilityPhasicitySpontaneityPropertiesThrombus Aging +---------+---------------+---------+-----------+----------+--------------+ CFV      Full           Yes      Yes                                 +---------+---------------+---------+-----------+----------+--------------+ SFJ  Full                                                        +---------+---------------+---------+-----------+----------+--------------+ FV Prox  Full           Yes      Yes                                 +---------+---------------+---------+-----------+----------+--------------+ FV Mid   Full           Yes      Yes                                  +---------+---------------+---------+-----------+----------+--------------+ FV DistalFull           Yes      Yes                                 +---------+---------------+---------+-----------+----------+--------------+ PFV      Full                                                        +---------+---------------+---------+-----------+----------+--------------+ POP      Full           Yes      Yes                                 +---------+---------------+---------+-----------+----------+--------------+ PTV      Full                                                        +---------+---------------+---------+-----------+----------+--------------+ PERO     Full                                                        +---------+---------------+---------+-----------+----------+--------------+   +---------+---------------+---------+-----------+----------+--------------+ LEFT     CompressibilityPhasicitySpontaneityPropertiesThrombus Aging +---------+---------------+---------+-----------+----------+--------------+ CFV      Full           Yes      Yes                                 +---------+---------------+---------+-----------+----------+--------------+ SFJ      Full                                                        +---------+---------------+---------+-----------+----------+--------------+ FV Prox  Full  Yes      Yes                                 +---------+---------------+---------+-----------+----------+--------------+ FV Mid   Full           Yes      Yes                                 +---------+---------------+---------+-----------+----------+--------------+ FV DistalFull           Yes      Yes                                 +---------+---------------+---------+-----------+----------+--------------+ PFV      Full                                                         +---------+---------------+---------+-----------+----------+--------------+ POP      Full           Yes      Yes                                 +---------+---------------+---------+-----------+----------+--------------+ PTV      Full                                                        +---------+---------------+---------+-----------+----------+--------------+ PERO     Full                                                        +---------+---------------+---------+-----------+----------+--------------+     Summary: BILATERAL: - No evidence of deep vein thrombosis seen in the lower extremities, bilaterally. - No evidence of superficial venous thrombosis in the lower extremities, bilaterally. -No evidence of popliteal cyst, bilaterally.   *See table(s) above for measurements and observations. Electronically signed by Harold Barban MD on 02/15/2021 at 9:25:19 PM.    Final    MR Lumbar Spine W Wo Contrast  Result Date: 02/14/2021 CLINICAL DATA:  Low back pain EXAM: MRI LUMBAR SPINE WITHOUT AND WITH CONTRAST TECHNIQUE: Multiplanar and multiecho pulse sequences of the lumbar spine were obtained without and with intravenous contrast. CONTRAST:  33m GADAVIST GADOBUTROL 1 MMOL/ML IV SOLN COMPARISON:  Pain FINDINGS: Segmentation:  Standard. Alignment:  Grade 1 anterolisthesis at L4-5 Vertebrae:  No fracture, evidence of discitis, or bone lesion. Conus medullaris and cauda equina: Conus extends to the L1 level. Conus and cauda equina appear normal. Paraspinal and other soft tissues: Negative Disc levels: The disc levels above L4 are unremarkable. L4-5: Small disc bulge with severe facet hypertrophy. Severe spinal canal stenosis and severe left foraminal stenosis. L5-S1: Severe facet arthrosis and mild disc bulge. No spinal canal stenosis. Mild bilateral neural foraminal stenosis. IMPRESSION: 1. Severe  spinal canal stenosis and left foraminal stenosis at L4-L5 due to combination of disc bulge and  severe facet arthrosis. 2. Severe facet arthrosis at L5-S1 with mild bilateral neural foraminal stenosis. Electronically Signed   By: Ulyses Jarred M.D.   On: 02/14/2021 22:15   DG Chest Port 1 View  Result Date: 02/14/2021 CLINICAL DATA:  Sepsis, back pain EXAM: PORTABLE CHEST 1 VIEW COMPARISON:  02/05/2021 FINDINGS: Prior CABG. Cardiomegaly. Vascular congestion. No overt edema, confluent opacities or effusions. No acute bony abnormality. IMPRESSION: Cardiomegaly, vascular congestion. Electronically Signed   By: Rolm Baptise M.D.   On: 02/14/2021 19:58    Assessment & Plan:   Problem List Items Addressed This Visit     Bradycardia    HR 37. Reduce metoprolol to 25 mg/d      Chronic kidney disease, stage 3a ([place])    Continue to monitor GFR. Hydrate well      Diarrhea    Chronic upset stomach - a little better off metformin. Creon did not help. Use Imodium prn      DM2 (diabetes mellitus, type 2) ([place])    Re-start Prandin tid ac  Off Metformin Freestyle Libre - pt never got it (Not covered). Using Accucheck Aviva plus         Meds ordered this encounter  Medications   glucose blood (ACCU-CHEK AVIVA PLUS) test strip    Sig: TEST BLOOD SUGAR TWICE DAILY    Dispense:  200 strip    Refill:  3    Dx E11.9   famotidine (PEPCID) 40 MG tablet    Sig: Take 1 tablet (40 mg total) by mouth at bedtime.    Dispense:  90 tablet    Refill:  3   metoprolol succinate (TOPROL XL) 50 MG 24 hr tablet    Sig: Take 0.5 tablets (25 mg total) by mouth daily.    Dispense:  90 tablet    Refill:  3      Follow-up: Return in about 3 months (around 07/10/2022) for a follow-up visit.  Walker Kehr, MD

## 2022-04-09 NOTE — Assessment & Plan Note (Signed)
Re-start Prandin tid ac  Off Metformin Freestyle Libre - pt never got it (Not covered). Using Accucheck Aviva plus

## 2022-04-09 NOTE — Assessment & Plan Note (Signed)
Chronic upset stomach - a little better off metformin. Creon did not help. Use Imodium prn

## 2022-04-09 NOTE — Assessment & Plan Note (Signed)
Continue to monitor GFR. Hydrate well

## 2022-04-09 NOTE — Assessment & Plan Note (Signed)
HR 37. Reduce metoprolol to 25 mg/d

## 2022-04-09 NOTE — Patient Instructions (Signed)
Reduce metoprolol to 25 mg/day

## 2022-04-12 NOTE — Progress Notes (Unsigned)
Cardiology Office Note:    Date:  04/12/2022   ID:  Beth Miller, DOB [birth year], MRN [id number]  PCP:  Cassandria Anger, MD   [facility] Providers Cardiologist:  Jenkins Rouge, MD     Referring MD: Cassandria Anger, MD    History of Present Illness:    Beth Miller is a very pleasant 81 y.o. female with a hx of longstanding atrial fib, mechanical MVR on chronic Coumadin, former smoker with COPD, DM, PVD, aortic atherosclerosis seen on CT, and carotid artery disease. She requires life-time SBE prophylaxis. Had childhood rheumatic fever that required hospitalization.   Prosthetic mitral valve replacement 1995, on coumadin. She established care with our group prior to 2013 and has maintained regular follow-up. No documentation of CAD, low risk myoview 2014.   In January 2022 she had digoxin toxicity with level over 3 and junctional bigeminy on EKG which resolved with d/c of digoxin.  Post monitor 12/12/2020 shows A-fib with rates in the 70s, no malignant ventricular beats or junctional rhythm.  In April 2022 she was hospitalized with sepsis unknown source and atrial fibrillation rates were elevated during admission.  Medical management with Cardizem and Toprol. TEE was advised but patient left before it could be completed. TTE 02/10/21 revealed EF 55%, severe biatrial enlargement, stable MVR Saint Jude mechanical valve with mean gradient 4, trivial MR, no obvious SBE, moderate to severe TR.   She has had some LE edema improved with lasix She has seen Dr Radford Pax for severe OSA but has issues wearing CPAP mask  ***    Past Medical History:  Diagnosis Date   AF (atrial fibrillation) ([place])    ALLERGIC RHINITIS    Blood transfusion without reported diagnosis    Cataract, senile    Chest pain    CHF (congestive heart failure) (HCC)    Chronic anticoagulation    Diabetes mellitus    Diabetes mellitus    Disorder of oral soft tissue    of mouth   Diverticulosis    GERD  (gastroesophageal reflux disease)    Hematoma    Hypertension    Leg pain    Low back pain    Paresthesia    Rheumatic fever    Status post mitral valve replacement    St. Jude valve   Stroke ([place])    tia   Sweating    Vitamin D deficiency     Past Surgical History:  Procedure Laterality Date   ABDOMINAL HYSTERECTOMY     CARDIAC VALVE REPLACEMENT  1995   Mitral valve prosthesis; st jude   CHOLECYSTECTOMY      Current Medications: No outpatient medications have been marked as taking for the 04/23/22 encounter (Appointment) with Josue Hector, MD.     Allergies:   Aricept [donepezil hcl], Food, Furosemide, Metformin and related, Quinine, Spironolactone, Tramadol, and Sulfadiazine   Social History   Socioeconomic History   Marital status: Married    Spouse name: Not on file   Number of children: 1   Years of education: Not on file   Highest education level: Not on file  Occupational History   Occupation: Retired    Fish farm manager: RETIRED  Tobacco Use   Smoking status: Former    Packs/day: 0.02    Years: 60.00    Total pack years: 1.20    Types: Cigarettes    Quit date: 10/28/2002    Years since quitting: 19.4   Smokeless tobacco: Never   Tobacco comments:  quit x 13 years   Vaping Use   Vaping Use: Never used  Substance and Sexual Activity   Alcohol use: No    Alcohol/week: 0.0 standard drinks of alcohol   Drug use: No   Sexual activity: Yes  Other Topics Concern   Not on file  Social History Narrative   Negative Family History of Colon Cancer      Regular exercise-yes, bowling      Daily caffeine Use-rare   Social Determinants of Health   Financial Resource Strain: Low Risk  (12/18/2021)   Overall Financial Resource Strain (CARDIA)    Difficulty of Paying Living Expenses: Not hard at all  Food Insecurity: No Food Insecurity (12/18/2021)   Hunger Vital Sign    Worried About Running Out of Food in the Last Year: Never true    Ran Out of Food in the  Last Year: Never true  Transportation Needs: No Transportation Needs (12/18/2021)   PRAPARE - Hydrologist (Medical): No    Lack of Transportation (Non-Medical): No  Physical Activity: Sufficiently Active (12/18/2021)   Exercise Vital Sign    Days of Exercise per Week: 5 days    Minutes of Exercise per Session: 30 min  Stress: No Stress Concern Present (12/18/2021)   [place]    Feeling of Stress : Not at all  Social Connections: [place] (12/18/2021)   Social Connection and Isolation Panel [NHANES]    Frequency of Communication with Friends and Family: More than three times a week    Frequency of Social Gatherings with Friends and Family: More than three times a week    Attends Religious Services: More than 4 times per year    Active Member of [employer] or Organizations: Yes    Attends Music therapist: More than 4 times per year    Marital Status: Married     Family History: The patient's family history includes Diabetes in her brother and mother; Heart disease in her brother and brother; Other in her mother; Pancreatic cancer in her brother; Seizures in her mother. There is no history of Colon cancer.  ROS:   Please see the history of present illness.    + fatigue All other systems reviewed and are negative.  Labs/Other Studies Reviewed:    The following studies were reviewed today:  Echo 02/10/21  Left Ventricle: Left ventricular ejection fraction, by estimation, is  55%%. The left ventricle has low normal function. The left ventricle has  no regional wall motion abnormalities. Definity contrast agent was given  IV to delineate the left ventricular  endocardial borders. The left ventricular internal cavity size was normal  in size. There is mild left ventricular hypertrophy. Left ventricular  diastolic parameters are indeterminate.  Right Ventricle: The right  ventricular size is normal. Right vetricular  wall thickness was not assessed. Right ventricular systolic function is  low normal. There is moderately elevated pulmonary artery systolic  pressure. The tricuspid regurgitant  velocity is 3.07 m/s, and with an assumed right atrial pressure of 15  mmHg, the estimated right ventricular systolic pressure is 12.7 mmHg.  Left Atrium: Left atrial size was severely dilated.  Right Atrium: Right atrial size was severely dilated.  Pericardium: Trivial pericardial effusion is present.  Mitral Valve: MV prosthesis (St. Jude mechanical) is well seated, appears  to open well. mean gradient through the valve is approximately 4 mm Hg.  The mitral valve  has been repaired/replaced. Trivial mitral valve  regurgitation. There is a St. Jude  mechanical valve present in the mitral position. The mean mitral valve  gradient is 4.0 mmHg.  Tricuspid Valve: TR is eccentric, directed toward the interatrial septum.  The tricuspid valve is normal in structure. Tricuspid valve regurgitation  is moderate to severe.  Aortic Valve: The aortic valve is tricuspid. Aortic valve regurgitation is  not visualized. Mild to moderate aortic valve sclerosis/calcification is  present, without any evidence of aortic stenosis. Aortic valve mean  gradient measures 5.4 mmHg. Aortic  valve peak gradient measures 9.4 mmHg. Aortic valve area, by VTI measures  1.38 cm.  Pulmonic Valve: The pulmonic valve was normal in structure. Pulmonic valve  regurgitation is not visualized.  Aorta: The aortic root is normal in size and structure and aortic  dilatation noted. There is mild dilatation of the ascending aorta,  measuring 40 mm.  Venous: The inferior vena cava is dilated in size with less than 50%  respiratory variability, suggesting right atrial pressure of 15 mmHg.  IAS/Shunts: No atrial level shunt detected by color flow Doppler.   Cardiac monitor 11/2020  Frequent episodes of atrial  flutter PVCls occasional Does have episodes of conversion    Myocardial Perfusion Imaging 12/2012 Normal perfusion, low risk   Recent Labs: 11/15/2021: NT-Pro BNP 274 02/26/2022: ALT 10; BUN 13; Creatinine, Ser 1.32; Hemoglobin 11.2; Platelets 271.0; Potassium 3.9; Sodium 140; TSH 2.42  Recent Lipid Panel    Component Value Date/Time   CHOL 116 01/22/2022 1120   TRIG 92 01/22/2022 1120   HDL 47 01/22/2022 1120   CHOLHDL 2.5 01/22/2022 1120   CHOLHDL 3 04/27/2019 1041   VLDL 35.4 04/27/2019 1041   LDLCALC 51 01/22/2022 1120     Risk Assessment/Calculations:    CHA2DS2-VASc Score = 6  {This indicates a 9.7% annual risk of stroke. The patient's score is based upon: CHF History: 0 HTN History: 1 Diabetes History: 1 Stroke History: 0 Vascular Disease History: 1 Age Score: 2 Gender Score: 1    Physical Exam:    VS:  LMP  (LMP Unknown)     Wt Readings from Last 3 Encounters:  04/09/22 196 lb (88.9 kg)  02/26/22 195 lb (88.5 kg)  01/31/22 197 lb (89.4 kg)     Affect appropriate Overweight black female  HEENT: normal Neck supple with no adenopathy JVP normal no bruits no thyromegaly Lungs clear with no wheezing and good diaphragmatic motion Heart:  C7/ click S2 no murmur, no rub, gallop or click PMI normal Abdomen: benighn, BS positve, no tenderness, no AAA no bruit.  No HSM or HJR Distal pulses intact with no bruits Trace  edema Neuro non-focal Skin warm and dry No muscular weakness   EKG:   05/14/21 afib rate 112 nonspecific ST changes   Diagnoses:    No diagnosis found.  Assessment and Plan:     Mitral valve disese, rheumatic/Hx MVR on chronic anticoagulation: Mitral valve replaced 1995.  Normal valve click without signs and symptoms of worsening valve function. Normal valve function by echo 01/2021. Management of coumadin by our coumadin clinic.   Dyspnea: LVEF 55%, no rwma, mild LVH, indeterminate diastolic parameters by echo 01/2021. Lasix dose  increased BNP was normal 11/15/21 Myovue done 01/31/22 no ischemia stable   OSA: re established with Dr Radford Pax severe OSA compliance with mask discussed   Permanent atrial fibrillation: Severe bi-atrial enlargement by echo 01/2021. Dig toxicity in past beta blocker decreased by primary  04/09/22 for low pulse ***  Aortic atherosclerosis/Hyperlipidemia LDL goal < 70: LDL   51 on current statin Rx   Fatigue: related to age, afib and poor compliance with CPAP   DM:  metformin d/c due to stomach upset A1c 8.2 f/u Dr Laurian Brim Started on Prandin   GERD:  not much better off Metformin Creon did not help PRN Imodium Continue Protonix and pepcid    Disposition: F/U in a year     Signed, Jenkins Rouge, MD  04/12/2022 5:45 PM    [place]

## 2022-04-16 ENCOUNTER — Ambulatory Visit: Payer: Medicare Other

## 2022-04-16 DIAGNOSIS — Z7901 Long term (current) use of anticoagulants: Secondary | ICD-10-CM

## 2022-04-16 DIAGNOSIS — Z5181 Encounter for therapeutic drug level monitoring: Secondary | ICD-10-CM | POA: Diagnosis not present

## 2022-04-16 DIAGNOSIS — I4891 Unspecified atrial fibrillation: Secondary | ICD-10-CM | POA: Diagnosis not present

## 2022-04-16 DIAGNOSIS — Z9889 Other specified postprocedural states: Secondary | ICD-10-CM | POA: Diagnosis not present

## 2022-04-16 LAB — POCT INR: INR: 1.6 — AB (ref 2.0–3.0)

## 2022-04-16 NOTE — Patient Instructions (Signed)
TAKE 1.5 TABLETS TODAY ONLY   and then continue on same dosage of Warfarin '10mg'$  (white tablet) daily.  Recheck INR in 2 weeks.  Stay consistent with greens 3 servings a week of greens.  Coumadin Clinic [phone].

## 2022-04-20 IMAGING — DX DG HIP (WITH OR WITHOUT PELVIS) 2-3V*R*
3 series · 3 of 3 positions shown · non-contrast
Comparison: None.

CLINICAL DATA: Severe right hip pain

EXAM:
DG HIP (WITH OR WITHOUT PELVIS) 2-3V RIGHT

[pelvis ap]
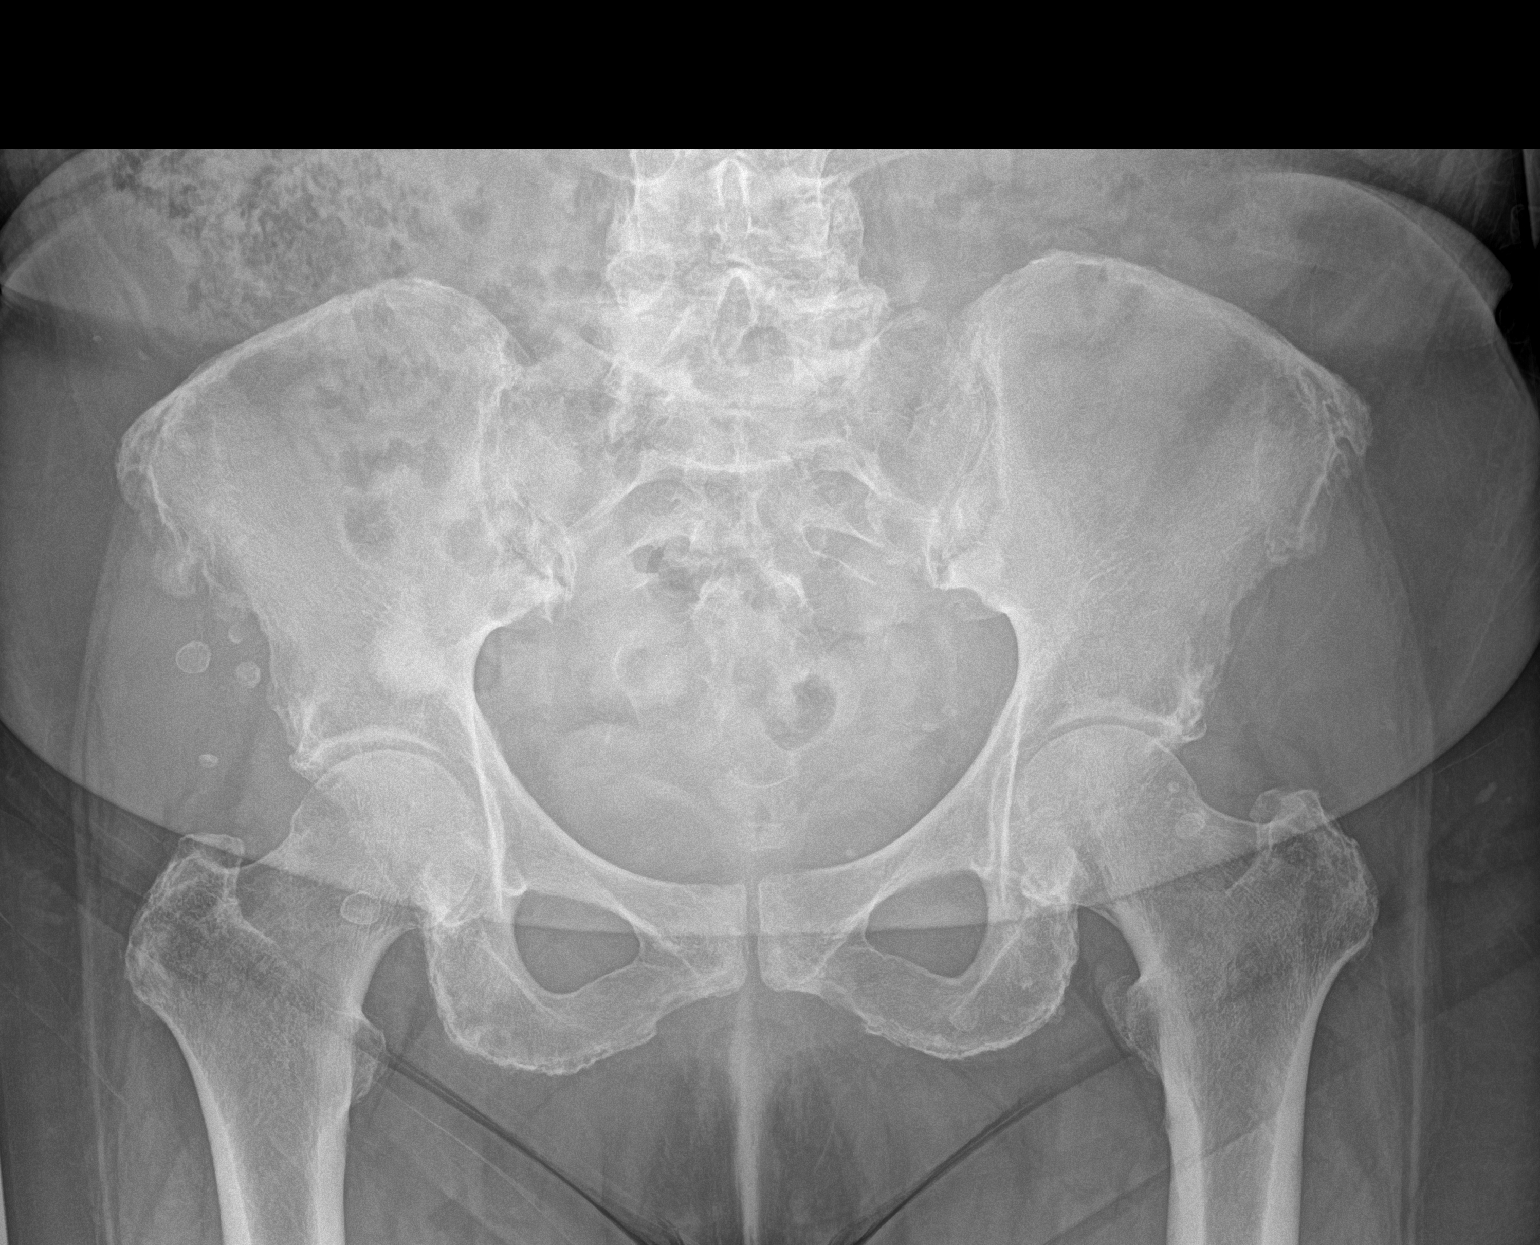

[hip ap]
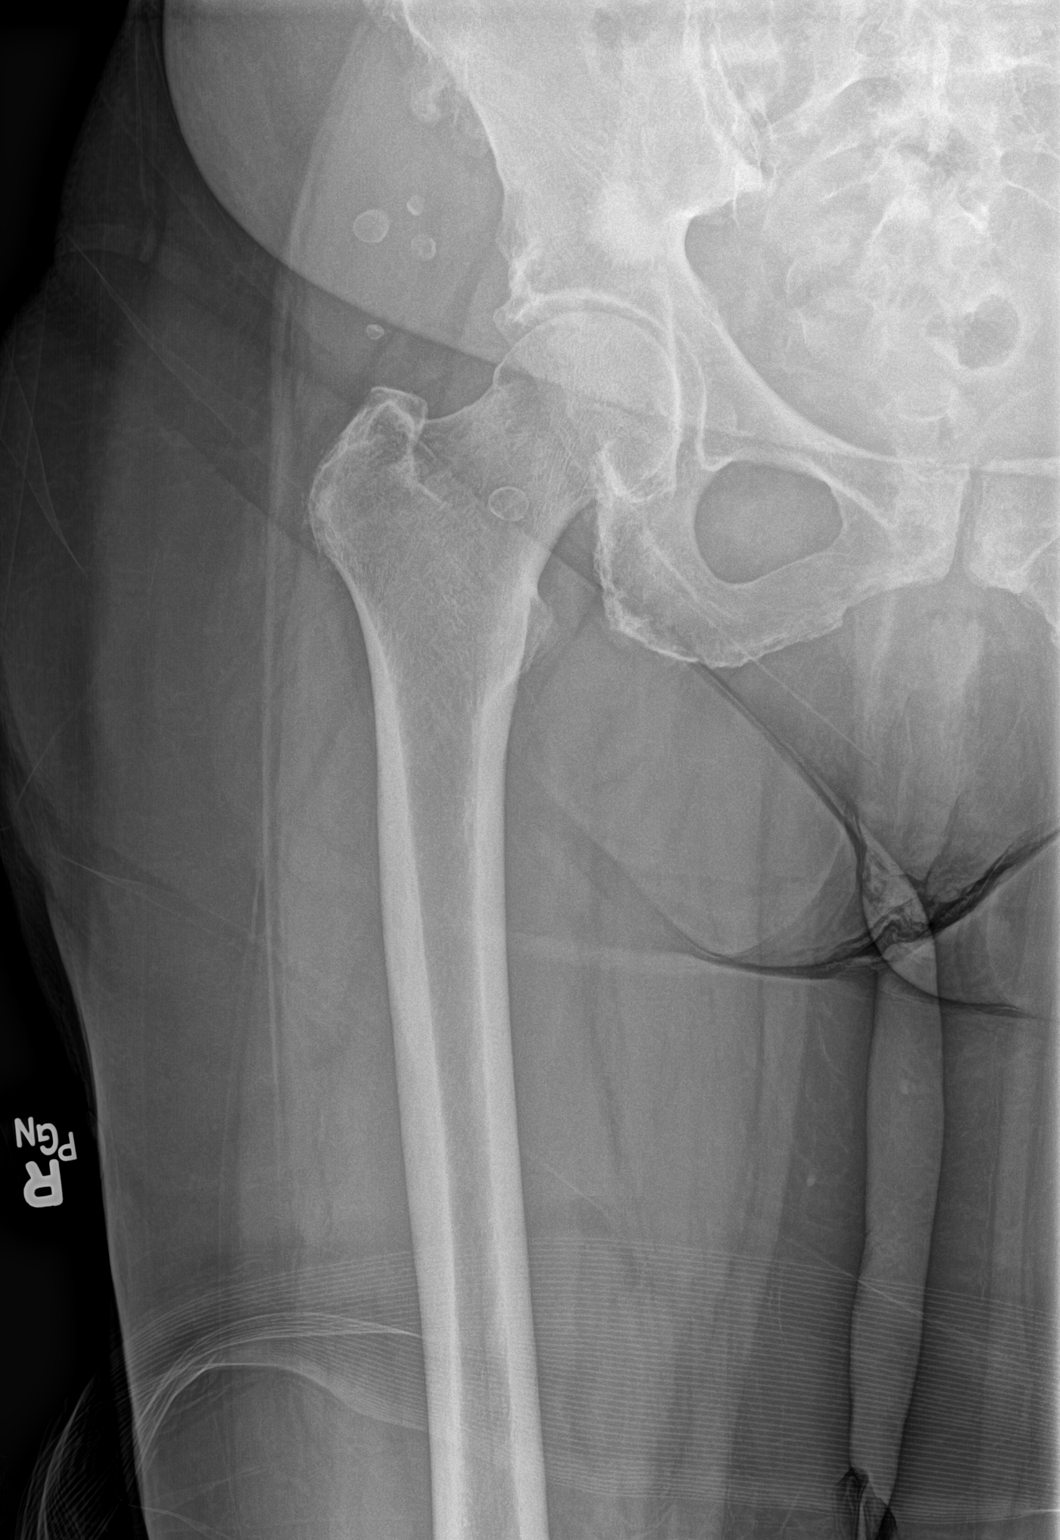

[hip frog leg]
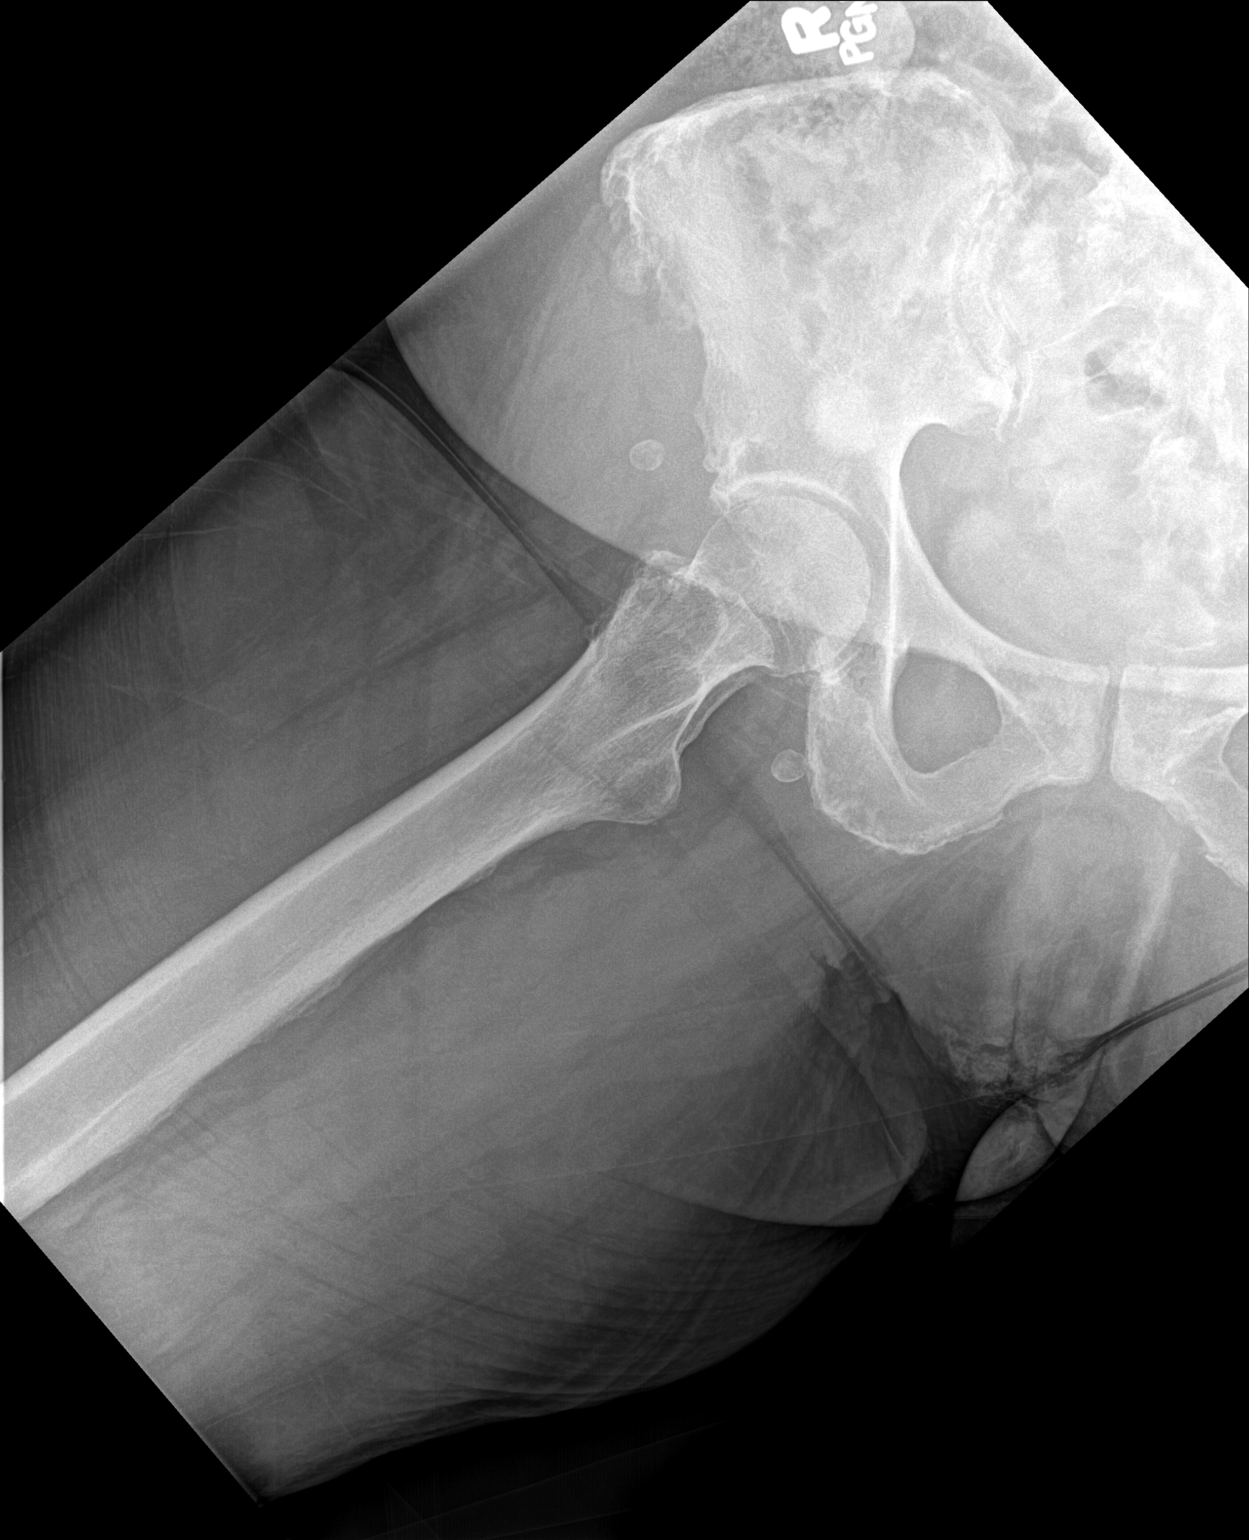

[3 of 3 positions shown; findings below may reference images not displayed]

FINDINGS: Minimal of degenerative change of both hips. Sclerotic lesion in the
right iliac wing is unchanged. No fracture or dislocation.
IMPRESSION: Minimal degenerative changes of both hips.

## 2022-04-23 ENCOUNTER — Encounter: Payer: Self-pay | Admitting: Cardiovascular Disease

## 2022-04-23 ENCOUNTER — Telehealth: Payer: Self-pay | Admitting: Cardiovascular Disease

## 2022-04-23 ENCOUNTER — Ambulatory Visit: Payer: Medicare Other | Admitting: Cardiovascular Disease

## 2022-04-23 VITALS — BP 110/72 | HR 137 | Ht 62.0 in | Wt 193.0 lb

## 2022-04-23 DIAGNOSIS — G4733 Obstructive sleep apnea (adult) (pediatric): Secondary | ICD-10-CM

## 2022-04-23 DIAGNOSIS — I4891 Unspecified atrial fibrillation: Secondary | ICD-10-CM | POA: Diagnosis not present

## 2022-04-23 DIAGNOSIS — I10 Essential (primary) hypertension: Secondary | ICD-10-CM

## 2022-04-23 DIAGNOSIS — Z7901 Long term (current) use of anticoagulants: Secondary | ICD-10-CM

## 2022-04-23 DIAGNOSIS — Z9889 Other specified postprocedural states: Secondary | ICD-10-CM | POA: Diagnosis not present

## 2022-04-23 MED ORDER — COLCHICINE 0.6 MG PO TABS: 0.6000 mg | ORAL_TABLET | Freq: Two times a day (BID) | ORAL | 1 refills | Status: DC

## 2022-04-23 NOTE — Telephone Encounter (Signed)
Called [facility]. They have already shipped medication out and it will be there in 3 days. Informed patient that we could send medication supply for 3 days to local pharmacy, until medication is received by mail order, but not sure if her insurance will cover it. Patient stated she would just wait for the medication.

## 2022-04-29 ENCOUNTER — Ambulatory Visit: Payer: Medicare Other | Admitting: *Deleted

## 2022-04-29 DIAGNOSIS — Z7901 Long term (current) use of anticoagulants: Secondary | ICD-10-CM

## 2022-04-29 DIAGNOSIS — I4891 Unspecified atrial fibrillation: Secondary | ICD-10-CM

## 2022-04-29 DIAGNOSIS — Z9889 Other specified postprocedural states: Secondary | ICD-10-CM

## 2022-04-29 LAB — POCT INR: INR: 1.3 — AB (ref 2.0–3.0)

## 2022-04-29 NOTE — Patient Instructions (Addendum)
Description   Today take 2 tablets and tomorrow take 1.5 tablets and then continue Warfarin '10mg'$  (white tablet) daily. Recheck INR in 1 week.  Stay consistent with greens 3 servings a week of greens.  Coumadin Clinic [phone] or [phone]

## 2022-05-07 ENCOUNTER — Ambulatory Visit: Payer: Medicare Other | Admitting: Gastroenterology

## 2022-05-14 ENCOUNTER — Ambulatory Visit: Payer: Medicare Other

## 2022-05-14 DIAGNOSIS — I4891 Unspecified atrial fibrillation: Secondary | ICD-10-CM | POA: Diagnosis not present

## 2022-05-14 DIAGNOSIS — Z9889 Other specified postprocedural states: Secondary | ICD-10-CM

## 2022-05-14 DIAGNOSIS — Z7901 Long term (current) use of anticoagulants: Secondary | ICD-10-CM

## 2022-05-14 DIAGNOSIS — Z5181 Encounter for therapeutic drug level monitoring: Secondary | ICD-10-CM | POA: Diagnosis not present

## 2022-05-14 LAB — POCT INR: INR: 3.8 — AB (ref 2.0–3.0)

## 2022-05-14 NOTE — Patient Instructions (Signed)
continue Warfarin '10mg'$  (white tablet) daily. Recheck INR in 3 weeks. Eat greens tonight. Stay consistent with greens 3 servings a week of greens.  Coumadin Clinic [phone] or [phone]

## 2022-05-29 ENCOUNTER — Ambulatory Visit: Payer: Medicare Other | Admitting: Gastroenterology

## 2022-06-05 ENCOUNTER — Ambulatory Visit: Payer: Medicare Other | Admitting: Gastroenterology

## 2022-06-05 NOTE — Progress Notes (Deleted)
Note started in error.

## 2022-06-11 ENCOUNTER — Ambulatory Visit: Payer: Medicare Other

## 2022-06-11 DIAGNOSIS — I4891 Unspecified atrial fibrillation: Secondary | ICD-10-CM

## 2022-06-11 DIAGNOSIS — Z5181 Encounter for therapeutic drug level monitoring: Secondary | ICD-10-CM | POA: Diagnosis not present

## 2022-06-11 DIAGNOSIS — Z9889 Other specified postprocedural states: Secondary | ICD-10-CM

## 2022-06-11 DIAGNOSIS — Z7901 Long term (current) use of anticoagulants: Secondary | ICD-10-CM

## 2022-06-11 LAB — POCT INR: INR: 2.1 (ref 2.0–3.0)

## 2022-06-11 NOTE — Patient Instructions (Signed)
TAKE 1.5 TABLETS TODAY ONLY and then continue Warfarin '10mg'$  (white tablet) daily. Recheck INR in 3 weeks. Stay consistent with greens 3 servings a week of greens.  Coumadin Clinic [phone] or [phone]

## 2022-07-02 ENCOUNTER — Ambulatory Visit: Payer: Medicare Other

## 2022-07-04 ENCOUNTER — Ambulatory Visit: Payer: Medicare HMO | Attending: Cardiovascular Disease

## 2022-07-04 DIAGNOSIS — I4891 Unspecified atrial fibrillation: Secondary | ICD-10-CM

## 2022-07-04 DIAGNOSIS — Z9889 Other specified postprocedural states: Secondary | ICD-10-CM | POA: Diagnosis not present

## 2022-07-04 DIAGNOSIS — Z5181 Encounter for therapeutic drug level monitoring: Secondary | ICD-10-CM

## 2022-07-04 DIAGNOSIS — Z7901 Long term (current) use of anticoagulants: Secondary | ICD-10-CM | POA: Diagnosis not present

## 2022-07-04 LAB — POCT INR: INR: 1.7 — AB (ref 2.0–3.0)

## 2022-07-04 NOTE — Patient Instructions (Signed)
TAKE 2 TABLETS TODAY ONLY and then continue Warfarin '10mg'$  (white tablet) daily. Recheck INR in 3 weeks. Stay consistent with greens 3 servings a week of greens.  Coumadin Clinic [phone] or [phone]

## 2022-07-13 ENCOUNTER — Other Ambulatory Visit: Payer: Self-pay | Admitting: Cardiovascular Disease

## 2022-07-13 DIAGNOSIS — I4821 Permanent atrial fibrillation: Secondary | ICD-10-CM

## 2022-07-22 ENCOUNTER — Telehealth: Payer: Self-pay

## 2022-07-22 ENCOUNTER — Other Ambulatory Visit: Payer: Self-pay | Admitting: Cardiovascular Disease

## 2022-07-22 ENCOUNTER — Other Ambulatory Visit: Payer: Self-pay | Admitting: Internal Medicine

## 2022-07-22 MED ORDER — ACCU-CHEK AVIVA PLUS VI STRP: ORAL_STRIP | 3 refills | Status: DC

## 2022-07-22 NOTE — Telephone Encounter (Signed)
Sent test strips to mail order.Marland KitchenJohny Chess

## 2022-07-22 NOTE — Telephone Encounter (Signed)
MEDICATION: glucose blood (ACCU-CHEK AVIVA PLUS) test strip  PHARMACY: [facility]  Comments: Patient is completely out.   **Let patient know to contact pharmacy at the end of the day to make sure medication is ready. **  ** Please notify patient to allow 48-72 hours to process**  **Encourage patient to contact the pharmacy for refills or they can request refills through [facility]**

## 2022-07-24 ENCOUNTER — Ambulatory Visit: Payer: Medicare Other | Admitting: Gastroenterology

## 2022-07-24 NOTE — Progress Notes (Deleted)
Referring Provider: Cassandria Anger, MD Primary Care Physician:  Cassandria Anger, MD   Chief Complaint:  Diarrhea   IMPRESSION:  Chronic diarrhea Recent acute change in bowel habits with nausea and intermittent vomiting, now resolved Focal tenderness at the site of abdominal wall scar, imprpved    - patient concerned about mass    - CT abd/pelvis with contrast 09/22/18: no acute abnormality Occasional NSAIDs Chronic anticoagulation with warfarin History of colon polyp    -Tubular adenoma removed 09/09/2012 with Dr. Olevia Perches    - surveillance not recommended at the time due to age Left sided diverticulosis  PLAN: Complete 8 weeks of pantoprazole 40 mg BID Consider EGD/Colonoscopy in the future if symptoms return Return to this clinic as needed, she declined scheduled follow-up at this time   HPI: Beth Miller is a 81 y.o. female who is referred for diarrhea. She was last seen 11/05/2018 for abdominal pain, nausea and vomiting.  The interval history is obtained from the patient review of her electronic health record.  History of mitral valve replacement in 1995 for rheumatic heart disease with a St. Jude's valve and a history of atrial fibrillation on chronic anticoagulation. Previous smoker with COPD, DM, and PVD with carotid disease. She has a history of colon polyps, but, surveillance was not recommended after her last exam given her advanced age. She has had cholecystectomy.  Presents for evaluation of chronic diarrhea. At least 3 explosive watery bowel movements daily. She has concurrent irritation Symptoms did not improve off metformin. No change in symptoms avoiding dairy.  Evaluation including Giardia, cryptosporidium, C. difficile, sedimentation rate, TSH and liver enzymes was normal. Chronic anemia with recent hemoglobin of 11.2, MCV 80, RDW 17.3, platelets 271  Prior endoscopic history: Colonoscopy with Dr. Olevia Perches 09/09/2012 showed a 54m sigmoid tubular  adenoma, right-sided lipoma, and pancolonic diverticulosis. Random colon biopsies were not obtained at that time.  Prior abdominal imaging: - CT of the abdomen and pelvis with contrast 09/23/2018 showed evidence for her prior cholecystectomy, a normal pancreas and pancreatic duct, normal hepatobiliary tree.  There were no abdominal findings to explain her symptoms.  The radiologist noted a large stable sclerotic density in the right iliac bone - CT abdomen pelvis without contrast for pain 02/05/2021 showed no acute intra-abdominal or pelvic abnormality. - CT of the chest abdomen pelvis with contrast 02/10/2021 for fever of unknown origin showed scattered pulmonary nodules, ventral hernia containing fat, evidence for prior mitral valvuloplasty, and no other acute changes    Past Medical History:  Diagnosis Date   AF (atrial fibrillation) ([place]    ALLERGIC RHINITIS    Blood transfusion without reported diagnosis    Cataract, senile    Chest pain    CHF (congestive heart failure) (H[facility])    Chronic anticoagulation    Diabetes mellitus    Diabetes mellitus    Disorder of oral soft tissue    of mouth   Diverticulosis    GERD (gastroesophageal reflux disease)    Hematoma    Hypertension    Leg pain    Low back pain    Paresthesia    Rheumatic fever    Status post mitral valve replacement    St. Jude valve   Stroke ([place]    tia   Sweating    Vitamin D deficiency     Past Surgical History:  Procedure Laterality Date   ABDOMINAL HYSTERECTOMY     CARDIAC VALVE REPLACEMENT  1995   Mitral  valve prosthesis; st jude   CHOLECYSTECTOMY      Current Outpatient Medications  Medication Sig Dispense Refill   Accu-Chek Softclix Lancets lancets 1 each by Other route 2 (two) times daily. Use to check blood sugars twice a day 200 each 3   acetaminophen (TYLENOL) 500 MG tablet Take 1,000 mg by mouth every 6 (six) hours as needed for mild pain.     Alcohol Swabs (B-D SINGLE USE SWABS REGULAR)  PADS 1 each by Does not apply route 2 (two) times daily. Use to clean finger to check blood sugars twice a day 200 each 3   amoxicillin (AMOXIL) 500 MG tablet Take four capsules (2,000 mg) one hour prior to all dental visits. 8 tablet 11   Cholecalciferol (VITAMIN D3) 1000 UNIT tablet Take 1,000 Units by mouth daily.     colchicine 0.6 MG tablet Take 1 tablet (0.6 mg total) by mouth 2 (two) times daily. For 2 weeks for gout 14 tablet 1   cyclobenzaprine (FLEXERIL) 10 MG tablet take 1/2 to 1 TABLET BY MOUTH THREE TIMES DAILY AS NEEDED FOR MUSCLE SPASMS 90 tablet 1   diltiazem (CARDIZEM CD) 240 MG 24 hr capsule Take 1 capsule (240 mg total) by mouth daily. 14 capsule 0   famotidine (PEPCID) 40 MG tablet Take 1 tablet (40 mg total) by mouth at bedtime. 90 tablet 3   furosemide (LASIX) 20 MG tablet TAKE ONE TABLET BY MOUTH EVERY MORNING 90 tablet 1   furosemide (LASIX) 40 MG tablet Take 1 tablet (40 mg total) by mouth daily. 90 tablet 3   gabapentin (NEURONTIN) 100 MG capsule TAKE ONE CAPSULE BY MOUTH AT BEDTIME 90 capsule 3   glucose blood (ACCU-CHEK AVIVA PLUS) test strip TEST BLOOD SUGAR TWICE DAILY 200 strip 3   Lancet Devices (ACCU-CHEK SOFTCLIX) lancets 1 each by Other route 2 (two) times daily. Use as instructed 1 each 5   loratadine (ALLERGY RELIEF) 10 MG tablet TAKE ONE TABLET BY MOUTH EVERY DAY 30 tablet 11   memantine (NAMENDA) 5 MG tablet TAKE ONE TABLET BY MOUTH BY MOUTH TWICE DAILY 180 tablet 3   metoprolol succinate (TOPROL-XL) 50 MG 24 hr tablet Take 0.5 tablets (25 mg total) by mouth daily. 45 tablet 2   mirabegron ER (MYRBETRIQ) 25 MG TB24 tablet TAKE ONE TABLET BY MOUTH DAILY 90 tablet 1   pantoprazole (PROTONIX) 40 MG tablet TAKE ONE TABLET BY MOUTH BY MOUTH EVERY MORNING 90 tablet 1   potassium chloride (KLOR-CON M) 10 MEQ tablet Take 1 tablet (10 mEq total) by mouth 2 (two) times daily. 180 tablet 3   vitamin B-12 (CYANOCOBALAMIN) 1000 MCG tablet Take 1 tablet (1,000 mcg total) by  mouth daily. 90 tablet 3   warfarin (COUMADIN) 10 MG tablet Take 1 tablet by mouth daily or as directed by Coumadin Clinic 90 tablet 1   warfarin (COUMADIN) 5 MG tablet TAKE 1 TABLET TWICE WEEKLY ALONG WITH '10MG'$  DOSE OR AS ADVISED BY COUMADIN CLINIC 30 tablet 1   No current facility-administered medications for this visit.    Allergies as of 07/24/2022 - Review Complete 04/23/2022  Allergen Reaction Noted   Aricept [donepezil hcl]  03/12/2017   Food  02/16/2012   Furosemide  07/20/2013   Metformin and related Diarrhea 04/09/2022   Quinine Other (See Comments) 02/10/2009   Spironolactone  01/13/2013   Tramadol  02/16/2016   Sulfadiazine Itching and Rash     Family History  Problem Relation Age of Onset  Diabetes Mother    Seizures Mother    Other Mother        brain tumor   Heart disease Brother    Diabetes Brother    Heart disease Brother    Pancreatic cancer Brother    Colon cancer Neg Hx      Physical Exam: Vital signs were reviewed. General:   Alert, well-nourished, pleasant and cooperative in NAD Head:  Normocephalic and atraumatic. Eyes:  Sclera clear, no icterus.   Conjunctiva pink. Abdomen:  Soft, normal bowel sounds. Well healed surgical scars. Tenderness at the superior margin of the midline scar with some associated firmness. No rebound or guarding. No hepatosplenomegaly Neurologic:  Alert and  oriented x4;  grossly nonfocal Skin:  No rash or bruise. Psych:  Alert and cooperative. Normal mood and affect.   Brodi Kari L. Tarri Glenn Md, MPH [facility] 07/24/2022, 8:31 AM

## 2022-07-25 ENCOUNTER — Ambulatory Visit: Payer: Medicare HMO | Attending: Cardiovascular Disease

## 2022-07-25 DIAGNOSIS — I4891 Unspecified atrial fibrillation: Secondary | ICD-10-CM | POA: Diagnosis not present

## 2022-07-25 DIAGNOSIS — Z7901 Long term (current) use of anticoagulants: Secondary | ICD-10-CM | POA: Diagnosis not present

## 2022-07-25 LAB — POCT INR: INR: 2.3 (ref 2.0–3.0)

## 2022-07-25 NOTE — Patient Instructions (Addendum)
Description   TAKE '15MG'$  TODAY ONLY and then START taking Warfarin '10mg'$  (white tablet) daily EXCEPT '15mg'$  every Sunday.  Recheck INR in 3 weeks. Stay consistent with greens 1-2 servings a week of greens.  Coumadin Clinic [phone] or [phone]

## 2022-07-31 ENCOUNTER — Encounter: Payer: Self-pay | Admitting: Internal Medicine

## 2022-07-31 ENCOUNTER — Ambulatory Visit (INDEPENDENT_AMBULATORY_CARE_PROVIDER_SITE_OTHER): Payer: Medicare HMO | Admitting: Internal Medicine

## 2022-07-31 ENCOUNTER — Ambulatory Visit (INDEPENDENT_AMBULATORY_CARE_PROVIDER_SITE_OTHER): Payer: Medicare HMO

## 2022-07-31 VITALS — BP 134/76 | HR 90 | Temp 98.2°F | Resp 16 | Ht 62.0 in | Wt 198.0 lb

## 2022-07-31 DIAGNOSIS — N1831 Chronic kidney disease, stage 3a: Secondary | ICD-10-CM | POA: Diagnosis not present

## 2022-07-31 DIAGNOSIS — E1122 Type 2 diabetes mellitus with diabetic chronic kidney disease: Secondary | ICD-10-CM | POA: Diagnosis not present

## 2022-07-31 DIAGNOSIS — M25572 Pain in left ankle and joints of left foot: Secondary | ICD-10-CM | POA: Diagnosis not present

## 2022-07-31 DIAGNOSIS — M7989 Other specified soft tissue disorders: Secondary | ICD-10-CM | POA: Diagnosis not present

## 2022-07-31 DIAGNOSIS — M79672 Pain in left foot: Secondary | ICD-10-CM

## 2022-07-31 DIAGNOSIS — M79675 Pain in left toe(s): Secondary | ICD-10-CM | POA: Insufficient documentation

## 2022-07-31 DIAGNOSIS — M10072 Idiopathic gout, left ankle and foot: Secondary | ICD-10-CM | POA: Diagnosis not present

## 2022-07-31 DIAGNOSIS — M25472 Effusion, left ankle: Secondary | ICD-10-CM

## 2022-07-31 DIAGNOSIS — I10 Essential (primary) hypertension: Secondary | ICD-10-CM

## 2022-07-31 LAB — URIC ACID: Uric Acid, Serum: 7.5 mg/dL — ABNORMAL HIGH (ref 2.4–7.0)

## 2022-07-31 LAB — CBC WITH DIFFERENTIAL/PLATELET
Basophils Absolute: 0 10*3/uL (ref 0.0–0.1)
Basophils Relative: 0.6 % (ref 0.0–3.0)
Eosinophils Absolute: 0 10*3/uL (ref 0.0–0.7)
Eosinophils Relative: 0.7 % (ref 0.0–5.0)
HCT: 33.7 % — ABNORMAL LOW (ref 36.0–46.0)
Hemoglobin: 11.1 g/dL — ABNORMAL LOW (ref 12.0–15.0)
Lymphocytes Relative: 24.4 % (ref 12.0–46.0)
Lymphs Abs: 1.5 10*3/uL (ref 0.7–4.0)
MCHC: 33 g/dL (ref 30.0–36.0)
MCV: 79 fl (ref 78.0–100.0)
Monocytes Absolute: 1 10*3/uL (ref 0.1–1.0)
Monocytes Relative: 16.6 % — ABNORMAL HIGH (ref 3.0–12.0)
Neutro Abs: 3.4 10*3/uL (ref 1.4–7.7)
Neutrophils Relative %: 57.7 % (ref 43.0–77.0)
Platelets: 182 10*3/uL (ref 150.0–400.0)
RBC: 4.26 Mil/uL (ref 3.87–5.11)
RDW: 17.3 % — ABNORMAL HIGH (ref 11.5–15.5)
WBC: 5.9 10*3/uL (ref 4.0–10.5)

## 2022-07-31 LAB — BASIC METABOLIC PANEL
BUN: 13 mg/dL (ref 6–23)
CO2: 33 mEq/L — ABNORMAL HIGH (ref 19–32)
Calcium: 9.8 mg/dL (ref 8.4–10.5)
Chloride: 101 mEq/L (ref 96–112)
Creatinine, Ser: 0.96 mg/dL (ref 0.40–1.20)
GFR: 54.92 mL/min — ABNORMAL LOW (ref >60.00)
Glucose, Bld: 98 mg/dL (ref 70–99)
Potassium: 3.7 mEq/L (ref 3.5–5.1)
Sodium: 138 mEq/L (ref 135–145)

## 2022-07-31 LAB — C-REACTIVE PROTEIN: CRP: <1 mg/dL (ref 0.5–20.0)

## 2022-07-31 MED ORDER — OXYCODONE-ACETAMINOPHEN 5-325 MG PO TABS: 1.0000 | ORAL_TABLET | Freq: Three times a day (TID) | ORAL | 0 refills | Status: DC | PRN

## 2022-07-31 MED ORDER — METHYLPREDNISOLONE ACETATE 80 MG/ML IJ SUSP
120.0000 mg | Freq: Once | INTRAMUSCULAR | Status: AC
  Administered 2022-07-31: 120 mg via INTRAMUSCULAR

## 2022-07-31 NOTE — Progress Notes (Unsigned)
Subjective:  Patient ID: Beth Miller, female    DOB: 09-Nov-1940  Age: 81 y.o. MRN: [id number]  CC: No chief complaint on file.   HPI SAKINAH ROSAMOND presents for ***  Outpatient Medications Prior to Visit  Medication Sig Dispense Refill   Accu-Chek Softclix Lancets lancets 1 each by Other route 2 (two) times daily. Use to check blood sugars twice a day 200 each 3   acetaminophen (TYLENOL) 500 MG tablet Take 1,000 mg by mouth every 6 (six) hours as needed for mild pain.     Alcohol Swabs (B-D SINGLE USE SWABS REGULAR) PADS 1 each by Does not apply route 2 (two) times daily. Use to clean finger to check blood sugars twice a day 200 each 3   amoxicillin (AMOXIL) 500 MG tablet Take four capsules (2,000 mg) one hour prior to all dental visits. 8 tablet 11   Cholecalciferol (VITAMIN D3) 1000 UNIT tablet Take 1,000 Units by mouth daily.     colchicine 0.6 MG tablet Take 1 tablet (0.6 mg total) by mouth 2 (two) times daily. For 2 weeks for gout 14 tablet 1   cyclobenzaprine (FLEXERIL) 10 MG tablet take 1/2 to 1 TABLET BY MOUTH THREE TIMES DAILY AS NEEDED FOR MUSCLE SPASMS 90 tablet 1   diltiazem (CARDIZEM CD) 240 MG 24 hr capsule Take 1 capsule (240 mg total) by mouth daily. 14 capsule 0   famotidine (PEPCID) 40 MG tablet Take 1 tablet (40 mg total) by mouth at bedtime. 90 tablet 3   furosemide (LASIX) 20 MG tablet TAKE ONE TABLET BY MOUTH EVERY MORNING 90 tablet 1   furosemide (LASIX) 40 MG tablet Take 1 tablet (40 mg total) by mouth daily. 90 tablet 3   gabapentin (NEURONTIN) 100 MG capsule TAKE ONE CAPSULE BY MOUTH AT BEDTIME 90 capsule 3   glucose blood (ACCU-CHEK AVIVA PLUS) test strip TEST BLOOD SUGAR TWICE DAILY 200 strip 3   Lancet Devices (ACCU-CHEK SOFTCLIX) lancets 1 each by Other route 2 (two) times daily. Use as instructed 1 each 5   loratadine (ALLERGY RELIEF) 10 MG tablet TAKE ONE TABLET BY MOUTH EVERY DAY 30 tablet 11   memantine (NAMENDA) 5 MG tablet TAKE ONE TABLET BY MOUTH  BY MOUTH TWICE DAILY 180 tablet 3   metoprolol succinate (TOPROL-XL) 50 MG 24 hr tablet Take 0.5 tablets (25 mg total) by mouth daily. 45 tablet 2   mirabegron ER (MYRBETRIQ) 25 MG TB24 tablet TAKE ONE TABLET BY MOUTH DAILY 90 tablet 1   pantoprazole (PROTONIX) 40 MG tablet TAKE ONE TABLET BY MOUTH BY MOUTH EVERY MORNING 90 tablet 1   potassium chloride (KLOR-CON M) 10 MEQ tablet Take 1 tablet (10 mEq total) by mouth 2 (two) times daily. 180 tablet 3   vitamin B-12 (CYANOCOBALAMIN) 1000 MCG tablet Take 1 tablet (1,000 mcg total) by mouth daily. 90 tablet 3   warfarin (COUMADIN) 10 MG tablet Take 1 tablet by mouth daily or as directed by [facility] 90 tablet 1   warfarin (COUMADIN) 5 MG tablet TAKE 1 TABLET TWICE WEEKLY ALONG WITH '10MG'$  DOSE OR AS ADVISED BY COUMADIN CLINIC 30 tablet 1   No facility-administered medications prior to visit.    ROS Review of Systems  Objective:  BP 134/76 (BP Location: Right Arm, Patient Position: Sitting, Cuff Size: Large)   Pulse 90   Temp 98.2 F (36.8 C) (Oral)   Resp 16   Ht '5\' 2"'$  (1.575 m)   Wt 198 lb (  89.8 kg)   LMP  (LMP Unknown)   SpO2 94%   BMI 36.21 kg/m   BP Readings from Last 3 Encounters:  07/31/22 134/76  04/23/22 110/72  04/09/22 (!) 90/52    Wt Readings from Last 3 Encounters:  07/31/22 198 lb (89.8 kg)  04/23/22 193 lb (87.5 kg)  04/09/22 196 lb (88.9 kg)    Physical Exam  Lab Results  Component Value Date   WBC 5.9 07/31/2022   HGB 11.1 (L) 07/31/2022   HCT 33.7 (L) 07/31/2022   PLT 182.0 07/31/2022   GLUCOSE 98 07/31/2022   CHOL 116 01/22/2022   TRIG 92 01/22/2022   HDL 47 01/22/2022   LDLCALC 51 01/22/2022   ALT 10 02/26/2022   AST 16 02/26/2022   NA 138 07/31/2022   K 3.7 07/31/2022   CL 101 07/31/2022   CREATININE 0.96 07/31/2022   BUN 13 07/31/2022   CO2 33 (H) 07/31/2022   TSH 2.42 02/26/2022   INR 2.3 07/25/2022   HGBA1C 8.2 (H) 02/26/2022    VAS [place] UPPER EXTREMITY VENOUS DUPLEX  Result  Date: 02/15/2021 UPPER VENOUS STUDY  Indications: fever Comparison Study: no prior Performing Technologist: Abram Sander RVS  Examination Guidelines: A complete evaluation includes B-mode imaging, spectral Doppler, color Doppler, and power Doppler as needed of all accessible portions of each vessel. Bilateral testing is considered an integral part of a complete examination. Limited examinations for reoccurring indications may be performed as noted.  Right Findings: +----------+------------+---------+-----------+----------+-------+ RIGHT     CompressiblePhasicitySpontaneousPropertiesSummary +----------+------------+---------+-----------+----------+-------+ IJV           Full       Yes       Yes                      +----------+------------+---------+-----------+----------+-------+ Subclavian    Full       Yes       Yes                      +----------+------------+---------+-----------+----------+-------+ Axillary      Full       Yes       Yes                      +----------+------------+---------+-----------+----------+-------+ Brachial      Full       Yes       Yes                      +----------+------------+---------+-----------+----------+-------+ Radial        Full                                          +----------+------------+---------+-----------+----------+-------+ Ulnar         Full                                          +----------+------------+---------+-----------+----------+-------+ Cephalic      Full                                          +----------+------------+---------+-----------+----------+-------+ Basilic       Full                                          +----------+------------+---------+-----------+----------+-------+  Left Findings: +----------+------------+---------+-----------+----------+-------+ LEFT      CompressiblePhasicitySpontaneousPropertiesSummary  +----------+------------+---------+-----------+----------+-------+ IJV           Full       Yes       Yes                      +----------+------------+---------+-----------+----------+-------+ Subclavian    Full       Yes       Yes                      +----------+------------+---------+-----------+----------+-------+ Axillary      Full       Yes       Yes                      +----------+------------+---------+-----------+----------+-------+ Brachial      Full       Yes       Yes                      +----------+------------+---------+-----------+----------+-------+ Radial        Full                                          +----------+------------+---------+-----------+----------+-------+ Ulnar         Full                                          +----------+------------+---------+-----------+----------+-------+ Cephalic      Full                                          +----------+------------+---------+-----------+----------+-------+ Basilic       Full                                          +----------+------------+---------+-----------+----------+-------+  Summary: No evidence of deep vein or superficial vein thrombosis involving the right and left upper extremities.  *See table(s) above for measurements and observations.  Diagnosing physician: Harold Barban MD Electronically signed by Harold Barban MD on 02/15/2021 at 9:25:47 PM.    Final    VAS [place] LOWER EXTREMITY VENOUS (DVT)  Result Date: 02/15/2021  Lower Venous DVT Study Indications: Fever of unknown origin.  Comparison Study: No previous exams Performing Technologist: Rogelia Rohrer  Examination Guidelines: A complete evaluation includes B-mode imaging, spectral Doppler, color Doppler, and power Doppler as needed of all accessible portions of each vessel. Bilateral testing is considered an integral part of a complete examination. Limited examinations for reoccurring indications may be performed  as noted. The reflux portion of the exam is performed with the patient in reverse Trendelenburg.  +---------+---------------+---------+-----------+----------+--------------+ RIGHT    CompressibilityPhasicitySpontaneityPropertiesThrombus Aging +---------+---------------+---------+-----------+----------+--------------+ CFV      Full           Yes      Yes                                 +---------+---------------+---------+-----------+----------+--------------+ SFJ  Full                                                        +---------+---------------+---------+-----------+----------+--------------+ FV Prox  Full           Yes      Yes                                 +---------+---------------+---------+-----------+----------+--------------+ FV Mid   Full           Yes      Yes                                 +---------+---------------+---------+-----------+----------+--------------+ FV DistalFull           Yes      Yes                                 +---------+---------------+---------+-----------+----------+--------------+ PFV      Full                                                        +---------+---------------+---------+-----------+----------+--------------+ POP      Full           Yes      Yes                                 +---------+---------------+---------+-----------+----------+--------------+ PTV      Full                                                        +---------+---------------+---------+-----------+----------+--------------+ PERO     Full                                                        +---------+---------------+---------+-----------+----------+--------------+   +---------+---------------+---------+-----------+----------+--------------+ LEFT     CompressibilityPhasicitySpontaneityPropertiesThrombus Aging +---------+---------------+---------+-----------+----------+--------------+ CFV      Full            Yes      Yes                                 +---------+---------------+---------+-----------+----------+--------------+ SFJ      Full                                                        +---------+---------------+---------+-----------+----------+--------------+ FV Prox  Full  Yes      Yes                                 +---------+---------------+---------+-----------+----------+--------------+ FV Mid   Full           Yes      Yes                                 +---------+---------------+---------+-----------+----------+--------------+ FV DistalFull           Yes      Yes                                 +---------+---------------+---------+-----------+----------+--------------+ PFV      Full                                                        +---------+---------------+---------+-----------+----------+--------------+ POP      Full           Yes      Yes                                 +---------+---------------+---------+-----------+----------+--------------+ PTV      Full                                                        +---------+---------------+---------+-----------+----------+--------------+ PERO     Full                                                        +---------+---------------+---------+-----------+----------+--------------+     Summary: BILATERAL: - No evidence of deep vein thrombosis seen in the lower extremities, bilaterally. - No evidence of superficial venous thrombosis in the lower extremities, bilaterally. -No evidence of popliteal cyst, bilaterally.   *See table(s) above for measurements and observations. Electronically signed by Harold Barban MD on 02/15/2021 at 9:25:19 PM.    Final    MR Lumbar Spine W Wo Contrast  Result Date: 02/14/2021 CLINICAL DATA:  Low back pain EXAM: MRI LUMBAR SPINE WITHOUT AND WITH CONTRAST TECHNIQUE: Multiplanar and multiecho pulse sequences of the lumbar spine were obtained  without and with intravenous contrast. CONTRAST:  20m GADAVIST GADOBUTROL 1 MMOL/ML IV SOLN COMPARISON:  Pain FINDINGS: Segmentation:  Standard. Alignment:  Grade 1 anterolisthesis at L4-5 Vertebrae:  No fracture, evidence of discitis, or bone lesion. Conus medullaris and cauda equina: Conus extends to the L1 level. Conus and cauda equina appear normal. Paraspinal and other soft tissues: Negative Disc levels: The disc levels above L4 are unremarkable. L4-5: Small disc bulge with severe facet hypertrophy. Severe spinal canal stenosis and severe left foraminal stenosis. L5-S1: Severe facet arthrosis and mild disc bulge. No spinal canal stenosis. Mild bilateral neural foraminal stenosis. IMPRESSION: 1. Severe  spinal canal stenosis and left foraminal stenosis at L4-L5 due to combination of disc bulge and severe facet arthrosis. 2. Severe facet arthrosis at L5-S1 with mild bilateral neural foraminal stenosis. Electronically Signed   By: Ulyses Jarred M.D.   On: 02/14/2021 22:15   DG Chest Port 1 View  Result Date: 02/14/2021 CLINICAL DATA:  Sepsis, back pain EXAM: PORTABLE CHEST 1 VIEW COMPARISON:  02/05/2021 FINDINGS: Prior CABG. Cardiomegaly. Vascular congestion. No overt edema, confluent opacities or effusions. No acute bony abnormality. IMPRESSION: Cardiomegaly, vascular congestion. Electronically Signed   By: Rolm Baptise M.D.   On: 02/14/2021 19:58    DG Foot Complete Left  Result Date: 07/31/2022 CLINICAL DATA:  Left foot and ankle pain for 3 months. No known injury EXAM: LEFT FOOT - COMPLETE 3+ VIEW; LEFT ANKLE COMPLETE - 3+ VIEW COMPARISON:  Left foot radiograph 05/19/2009 FINDINGS: There is no evidence of fracture or dislocation. No ankle joint effusion. Moderate osteoarthritis of the first, second, and fifth MTP joints as well as in the midfoot. Osseous hypertrophy and incomplete bridging between the proximal diaphyses of the third and fourth metatarsals, similar to 05/19/2009. Small posterior and  plantar calcaneal enthesophytes. Soft tissue swelling is noted in the visualized distal left lower leg, at the ankle and in the left foot. IMPRESSION: 1. No acute osseous abnormality of the left ankle or foot. 2. Mild soft tissue swelling in the visualized distal left lower leg, at the ankle, and in the left foot. 3. 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 4. Moderate osteoarthritis in the midfoot and of the first, second, and fifth MTP joints. Electronically Signed   By: Ileana Roup M.D.   On: 07/31/2022 16:45   DG Ankle Complete Left  Result Date: 07/31/2022 CLINICAL DATA:  Left foot and ankle pain for 3 months. No known injury EXAM: LEFT FOOT - COMPLETE 3+ VIEW; LEFT ANKLE COMPLETE - 3+ VIEW COMPARISON:  Left foot radiograph 05/19/2009 FINDINGS: There is no evidence of fracture or dislocation. No ankle joint effusion. Moderate osteoarthritis of the first, second, and fifth MTP joints as well as in the midfoot. Osseous hypertrophy and incomplete bridging between the proximal diaphyses of the third and fourth metatarsals, similar to 05/19/2009. Small posterior and plantar calcaneal enthesophytes. Soft tissue swelling is noted in the visualized distal left lower leg, at the ankle and in the left foot. IMPRESSION: 1. No acute osseous abnormality of the left ankle or foot. 2. Mild soft tissue swelling in the visualized distal left lower leg, at the ankle, and in the left foot. 3. 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 4. Moderate osteoarthritis in the midfoot and of the first, second, and fifth MTP joints. Electronically Signed   By: Ileana Roup M.D.   On: 07/31/2022 16:45     Assessment & Plan:   Diagnoses and all orders for this visit:  Idiopathic gout, left ankle and foot -     methylPREDNISolone acetate (DEPO-MEDROL) injection 120 mg -     Basic metabolic panel; Future -     Uric acid; Future -     Uric acid -     Basic metabolic panel  Pain  and swelling of left ankle -     C-reactive protein; Future -     DG Ankle Complete Left; Future -     C-reactive protein  Acute foot pain, left -     C-reactive protein; Future -     DG Foot  Complete Left; Future -     C-reactive protein  Type 2 diabetes mellitus with stage 3a chronic kidney disease, without long-term current use of insulin (HCC) -     Basic metabolic panel; Future -     Basic metabolic panel  Essential hypertension -     Basic metabolic panel; Future -     CBC with Differential/Platelet; Future -     CBC with Differential/Platelet -     Basic metabolic panel  Stage 3a chronic kidney disease ([place])   I am having Danyal M. Leighty maintain her cholecalciferol, acetaminophen, accu-chek softclix, amoxicillin, cyanocobalamin, cyclobenzaprine, Accu-Chek Softclix Lancets, B-D SINGLE USE SWABS REGULAR, potassium chloride, furosemide, Myrbetriq, Allergy Relief, warfarin, diltiazem, pantoprazole, furosemide, famotidine, colchicine, warfarin, Accu-Chek Aviva Plus, memantine, metoprolol succinate, and gabapentin. We administered methylPREDNISolone acetate.  Meds ordered this encounter  Medications   methylPREDNISolone acetate (DEPO-MEDROL) injection 120 mg     Follow-up: Return in about 3 weeks (around 08/21/2022).  Scarlette Calico, MD

## 2022-07-31 NOTE — Patient Instructions (Signed)
Gout  Gout is a condition that causes painful swelling of the joints. Gout is a type of inflammation of the joints (arthritis). This condition is caused by having too much uric acid in the body. Uric acid is a chemical that forms when the body breaks down substances called purines. Purines are important for building body proteins. When the body has too much uric acid, sharp crystals can form and build up inside the joints. This causes pain and swelling. Gout attacks can happen quickly and may be very painful (acute gout). Over time, the attacks can affect more joints and become more frequent (chronic gout). Gout can also cause uric acid to build up under the skin and inside the kidneys. What are the causes? This condition is caused by too much uric acid in your blood. This can happen because: Your kidneys do not remove enough uric acid from your blood. This is the most common cause. Your body makes too much uric acid. This can happen with some cancers and cancer treatments. It can also occur if your body is breaking down too many red blood cells (hemolytic anemia). You eat too many foods that are high in purines. These foods include organ meats and some seafood. Alcohol, especially beer, is also high in purines. A gout attack may be triggered by trauma or stress. What increases the risk? The following factors may make you more likely to develop this condition: Having a family history of gout. Being female and middle-aged. Being female and having gone through menopause. Taking certain medicines, including aspirin, cyclosporine, diuretics, levodopa, and niacin. Having an organ transplant. Having certain conditions, such as: Being obese. Lead poisoning. Kidney disease. A skin condition called psoriasis. Other factors include: Losing weight too quickly. Being dehydrated. Frequently drinking alcohol, especially beer. Frequently drinking beverages that are sweetened with a type of sugar called  fructose. What are the signs or symptoms? An attack of acute gout happens quickly. It usually occurs in just one joint. The most common place is the big toe. Attacks often start at night. Other joints that may be affected include joints of the feet, ankle, knee, fingers, wrist, or elbow. Symptoms of this condition may include: Severe pain. Warmth. Swelling. Stiffness. Tenderness. The affected joint may be very painful to touch. Shiny, red, or purple skin. Chills and fever. Chronic gout may cause symptoms more frequently. More joints may be involved. You may also have white or yellow lumps (tophi) on your hands or feet or in other areas near your joints. How is this diagnosed? This condition is diagnosed based on your symptoms, your medical history, and a physical exam. You may have tests, such as: Blood tests to measure uric acid levels. Removal of joint fluid with a thin needle (aspiration) to look for uric acid crystals. X-rays to look for joint damage. How is this treated? Treatment for this condition has two phases: treating an acute attack and preventing future attacks. Acute gout treatment may include medicines to reduce pain and swelling, including: NSAIDs, such as ibuprofen. Steroids. These are strong anti-inflammatory medicines that can be taken by mouth (orally) or injected into a joint. Colchicine. This medicine relieves pain and swelling when it is taken soon after an attack. It can be given by mouth or through an IV. Preventive treatment may include: Daily use of smaller doses of NSAIDs or colchicine. Use of a medicine that reduces uric acid levels in your blood, such as allopurinol. Changes to your diet. You may need to see   a dietitian about what to eat and drink to prevent gout. Follow these instructions at home: During a gout attack  If directed, put ice on the affected area. To do this: Put ice in a plastic bag. Place a towel between your skin and the bag. Leave the  ice on for 20 minutes, 2-3 times a day. Remove the ice if your skin turns bright red. This is very important. If you cannot feel pain, heat, or cold, you have a greater risk of damage to the area. Raise (elevate) the affected joint above the level of your heart as often as possible. Rest the joint as much as possible. If the affected joint is in your leg, you may be given crutches to use. Follow instructions from your health care provider about eating or drinking restrictions. Avoiding future gout attacks Follow a low-purine diet as told by your dietitian or health care provider. Avoid foods and drinks that are high in purines, including liver, kidney, anchovies, asparagus, herring, mushrooms, mussels, and beer. Maintain a healthy weight or lose weight if you are overweight. If you want to lose weight, talk with your health care provider. Do not lose weight too quickly. Start or maintain an exercise program as told by your health care provider. Eating and drinking Avoid drinking beverages that contain fructose. Drink enough fluids to keep your urine pale yellow. If you drink alcohol: Limit how much you have to: 0-1 drink a day for women who are not pregnant. 0-2 drinks a day for men. Know how much alcohol is in a drink. In the [place] one drink equals one 12 oz bottle of beer (355 mL), one 5 oz glass of wine (148 mL), or one 1 oz glass of hard liquor (44 mL). General instructions Take over-the-counter and prescription medicines only as told by your health care provider. Ask your health care provider if the medicine prescribed to you requires you to avoid driving or using machinery. Return to your normal activities as told by your health care provider. Ask your health care provider what activities are safe for you. Keep all follow-up visits. This is important. Where to find more information National Institutes of Health: www.niams.nih.gov Contact a health care provider if you have: Another  gout attack. Continuing symptoms of a gout attack after 10 days of treatment. Side effects from your medicines. Chills or a fever. Burning pain when you urinate. Pain in your lower back or abdomen. Get help right away if you: Have severe or uncontrolled pain. Cannot urinate. Summary Gout is painful swelling of the joints caused by having too much uric acid in the body. The most common site for gout to occur is in the big toe, but it can affect other joints in the body. Medicines and dietary changes can help to prevent and treat gout attacks. This information is not intended to replace advice given to you by your health care provider. Make sure you discuss any questions you have with your health care provider. Document Revised: 07/18/2021 Document Reviewed: 07/18/2021 Elsevier Patient Education  2023 [employer]  

## 2022-08-01 ENCOUNTER — Ambulatory Visit: Payer: Medicare HMO | Admitting: Internal Medicine

## 2022-08-05 ENCOUNTER — Ambulatory Visit (INDEPENDENT_AMBULATORY_CARE_PROVIDER_SITE_OTHER): Payer: Medicare HMO | Admitting: Internal Medicine

## 2022-08-05 ENCOUNTER — Encounter: Payer: Self-pay | Admitting: Internal Medicine

## 2022-08-05 VITALS — BP 126/74 | HR 87 | Temp 98.2°F | Ht 62.0 in | Wt 187.0 lb

## 2022-08-05 DIAGNOSIS — Z23 Encounter for immunization: Secondary | ICD-10-CM

## 2022-08-05 DIAGNOSIS — N1831 Chronic kidney disease, stage 3a: Secondary | ICD-10-CM

## 2022-08-05 DIAGNOSIS — I10 Essential (primary) hypertension: Secondary | ICD-10-CM | POA: Diagnosis not present

## 2022-08-05 DIAGNOSIS — R32 Unspecified urinary incontinence: Secondary | ICD-10-CM

## 2022-08-05 DIAGNOSIS — N3281 Overactive bladder: Secondary | ICD-10-CM | POA: Diagnosis not present

## 2022-08-05 DIAGNOSIS — I359 Nonrheumatic aortic valve disorder, unspecified: Secondary | ICD-10-CM

## 2022-08-05 MED ORDER — GABAPENTIN 100 MG PO CAPS: 100.0000 mg | ORAL_CAPSULE | Freq: Every day | ORAL | 3 refills | Status: DC

## 2022-08-05 MED ORDER — COLCHICINE 0.6 MG PO TABS: ORAL_TABLET | ORAL | 1 refills | Status: AC

## 2022-08-05 MED ORDER — MIRABEGRON ER 25 MG PO TB24: 25.0000 mg | ORAL_TABLET | Freq: Every day | ORAL | 3 refills | Status: AC

## 2022-08-05 MED ORDER — MEMANTINE HCL 5 MG PO TABS: ORAL_TABLET | ORAL | 3 refills | Status: DC

## 2022-08-05 MED ORDER — COLCHICINE 0.6 MG PO TABS: 0.6000 mg | ORAL_TABLET | Freq: Two times a day (BID) | ORAL | 2 refills | Status: DC

## 2022-08-05 MED ORDER — TOLTERODINE TARTRATE ER 4 MG PO CP24: 4.0000 mg | ORAL_CAPSULE | Freq: Every day | ORAL | 11 refills | Status: DC

## 2022-08-05 NOTE — Progress Notes (Signed)
Subjective:  Patient ID: Beth Miller, female    DOB: Oct 13, 1941  Age: 81 y.o. MRN: [id number]  CC: Follow-up (3 month follow up)   HPI [name] presents for HTN, GERD, anticoagulation C/o recent gout - L foot pain C/o nausea w/percocet  Outpatient Medications Prior to Visit  Medication Sig Dispense Refill   Accu-Chek Softclix Lancets lancets 1 each by Other route 2 (two) times daily. Use to check blood sugars twice a day 200 each 3   acetaminophen (TYLENOL) 500 MG tablet Take 1,000 mg by mouth every 6 (six) hours as needed for mild pain.     Alcohol Swabs (B-D SINGLE USE SWABS REGULAR) PADS 1 each by Does not apply route 2 (two) times daily. Use to clean finger to check blood sugars twice a day 200 each 3   amoxicillin (AMOXIL) 500 MG tablet Take four capsules (2,000 mg) one hour prior to all dental visits. 8 tablet 11   Cholecalciferol (VITAMIN D3) 1000 UNIT tablet Take 1,000 Units by mouth daily.     cyclobenzaprine (FLEXERIL) 10 MG tablet take 1/2 to 1 TABLET BY MOUTH THREE TIMES DAILY AS NEEDED FOR MUSCLE SPASMS 90 tablet 1   diltiazem (CARDIZEM CD) 240 MG 24 hr capsule Take 1 capsule (240 mg total) by mouth daily. 14 capsule 0   famotidine (PEPCID) 40 MG tablet Take 1 tablet (40 mg total) by mouth at bedtime. 90 tablet 3   furosemide (LASIX) 20 MG tablet TAKE ONE TABLET BY MOUTH EVERY MORNING 90 tablet 1   furosemide (LASIX) 40 MG tablet Take 1 tablet (40 mg total) by mouth daily. 90 tablet 3   glucose blood (ACCU-CHEK AVIVA PLUS) test strip TEST BLOOD SUGAR TWICE DAILY 200 strip 3   Lancet Devices (ACCU-CHEK SOFTCLIX) lancets 1 each by Other route 2 (two) times daily. Use as instructed 1 each 5   loratadine (ALLERGY RELIEF) 10 MG tablet TAKE ONE TABLET BY MOUTH EVERY DAY 30 tablet 11   metoprolol succinate (TOPROL-XL) 50 MG 24 hr tablet Take 0.5 tablets (25 mg total) by mouth daily. 45 tablet 2   pantoprazole (PROTONIX) 40 MG tablet TAKE ONE TABLET BY MOUTH BY MOUTH  EVERY MORNING 90 tablet 1   potassium chloride (KLOR-CON M) 10 MEQ tablet Take 1 tablet (10 mEq total) by mouth 2 (two) times daily. 180 tablet 3   vitamin B-12 (CYANOCOBALAMIN) 1000 MCG tablet Take 1 tablet (1,000 mcg total) by mouth daily. 90 tablet 3   warfarin (COUMADIN) 10 MG tablet Take 1 tablet by mouth daily or as directed by [facility] 90 tablet 1   warfarin (COUMADIN) 5 MG tablet TAKE 1 TABLET TWICE WEEKLY ALONG WITH '10MG'$  DOSE OR AS ADVISED BY COUMADIN CLINIC 30 tablet 1   colchicine 0.6 MG tablet Take 1 tablet (0.6 mg total) by mouth 2 (two) times daily. For 2 weeks for gout 14 tablet 1   gabapentin (NEURONTIN) 100 MG capsule TAKE ONE CAPSULE BY MOUTH AT BEDTIME 90 capsule 3   memantine (NAMENDA) 5 MG tablet TAKE ONE TABLET BY MOUTH BY MOUTH TWICE DAILY 180 tablet 3   mirabegron ER (MYRBETRIQ) 25 MG TB24 tablet TAKE ONE TABLET BY MOUTH DAILY 90 tablet 1   oxyCODONE-acetaminophen (PERCOCET/ROXICET) 5-325 MG tablet Take 1 tablet by mouth every 8 (eight) hours as needed for up to 7 days for severe pain. (Patient not taking: Reported on 08/05/2022) 25 tablet 0   No facility-administered medications prior to visit.  ROS: Review of Systems  Constitutional:  Negative for activity change, appetite change, chills, fatigue and unexpected weight change.  HENT:  Negative for congestion, mouth sores and sinus pressure.   Eyes:  Negative for visual disturbance.  Respiratory:  Negative for cough and chest tightness.   Gastrointestinal:  Negative for abdominal pain and nausea.  Genitourinary:  Positive for difficulty urinating, frequency and urgency. Negative for vaginal pain.  Musculoskeletal:  Positive for arthralgias. Negative for back pain and gait problem.  Skin:  Negative for pallor and rash.  Neurological:  Negative for dizziness, tremors, weakness, numbness and headaches.  Psychiatric/Behavioral:  Negative for confusion, sleep disturbance and suicidal ideas.     Objective:  BP  126/74 (BP Location: Left Arm, Patient Position: Sitting, Cuff Size: Large)   Pulse 87   Temp 98.2 F (36.8 C) (Oral)   Ht '5\' 2"'$  (1.575 m)   Wt 187 lb (84.8 kg)   LMP  (LMP Unknown)   SpO2 98%   BMI 34.20 kg/m   BP Readings from Last 3 Encounters:  08/05/22 126/74  07/31/22 134/76  04/23/22 110/72    Wt Readings from Last 3 Encounters:  08/05/22 187 lb (84.8 kg)  07/31/22 198 lb (89.8 kg)  04/23/22 193 lb (87.5 kg)    Physical Exam Constitutional:      General: She is not in acute distress.    Appearance: She is well-developed. She is obese.  HENT:     Head: Normocephalic.     Right Ear: External ear normal.     Left Ear: External ear normal.     Nose: Nose normal.  Eyes:     General:        Right eye: No discharge.        Left eye: No discharge.     Conjunctiva/sclera: Conjunctivae normal.     Pupils: Pupils are equal, round, and reactive to light.  Neck:     Thyroid: No thyromegaly.     Vascular: No JVD.     Trachea: No tracheal deviation.  Cardiovascular:     Rate and Rhythm: Normal rate. Rhythm irregular.  Pulmonary:     Effort: No respiratory distress.     Breath sounds: No stridor. No wheezing.  Abdominal:     General: Bowel sounds are normal. There is no distension.     Palpations: Abdomen is soft. There is no mass.     Tenderness: There is no abdominal tenderness. There is no guarding or rebound.  Musculoskeletal:     Cervical back: Normal range of motion and neck supple. Tenderness present. No rigidity.  Lymphadenopathy:     Cervical: No cervical adenopathy.  Skin:    Findings: No erythema or rash.  Neurological:     Mental Status: She is oriented to person, place, and time.     Cranial Nerves: No cranial nerve deficit.     Motor: No abnormal muscle tone.     Coordination: Coordination normal.     Gait: Gait abnormal.     Deep Tendon Reflexes: Reflexes normal.  Psychiatric:        Behavior: Behavior normal.        Thought Content: Thought  content normal.        Judgment: Judgment normal.   Metallic click    Lab Results  Component Value Date   WBC 5.9 07/31/2022   HGB 11.1 (L) 07/31/2022   HCT 33.7 (L) 07/31/2022   PLT 182.0 07/31/2022   GLUCOSE 98 07/31/2022  CHOL 116 01/22/2022   TRIG 92 01/22/2022   HDL 47 01/22/2022   LDLCALC 51 01/22/2022   ALT 10 02/26/2022   AST 16 02/26/2022   NA 138 07/31/2022   K 3.7 07/31/2022   CL 101 07/31/2022   CREATININE 0.96 07/31/2022   BUN 13 07/31/2022   CO2 33 (H) 07/31/2022   TSH 2.42 02/26/2022   INR 2.3 07/25/2022   HGBA1C 8.2 (H) 02/26/2022    VAS [place] UPPER EXTREMITY VENOUS DUPLEX  Result Date: 02/15/2021 UPPER VENOUS STUDY  Indications: fever Comparison Study: no prior Performing Technologist: Abram Sander RVS  Examination Guidelines: A complete evaluation includes B-mode imaging, spectral Doppler, color Doppler, and power Doppler as needed of all accessible portions of each vessel. Bilateral testing is considered an integral part of a complete examination. Limited examinations for reoccurring indications may be performed as noted.  Right Findings: +----------+------------+---------+-----------+----------+-------+ RIGHT     CompressiblePhasicitySpontaneousPropertiesSummary +----------+------------+---------+-----------+----------+-------+ IJV           Full       Yes       Yes                      +----------+------------+---------+-----------+----------+-------+ Subclavian    Full       Yes       Yes                      +----------+------------+---------+-----------+----------+-------+ Axillary      Full       Yes       Yes                      +----------+------------+---------+-----------+----------+-------+ Brachial      Full       Yes       Yes                      +----------+------------+---------+-----------+----------+-------+ Radial        Full                                           +----------+------------+---------+-----------+----------+-------+ Ulnar         Full                                          +----------+------------+---------+-----------+----------+-------+ Cephalic      Full                                          +----------+------------+---------+-----------+----------+-------+ Basilic       Full                                          +----------+------------+---------+-----------+----------+-------+  Left Findings: +----------+------------+---------+-----------+----------+-------+ LEFT      CompressiblePhasicitySpontaneousPropertiesSummary +----------+------------+---------+-----------+----------+-------+ IJV           Full       Yes       Yes                      +----------+------------+---------+-----------+----------+-------+ Subclavian  Full       Yes       Yes                      +----------+------------+---------+-----------+----------+-------+ Axillary      Full       Yes       Yes                      +----------+------------+---------+-----------+----------+-------+ Brachial      Full       Yes       Yes                      +----------+------------+---------+-----------+----------+-------+ Radial        Full                                          +----------+------------+---------+-----------+----------+-------+ Ulnar         Full                                          +----------+------------+---------+-----------+----------+-------+ Cephalic      Full                                          +----------+------------+---------+-----------+----------+-------+ Basilic       Full                                          +----------+------------+---------+-----------+----------+-------+  Summary: No evidence of deep vein or superficial vein thrombosis involving the right and left upper extremities.  *See table(s) above for measurements and observations.  Diagnosing  physician: Harold Barban MD Electronically signed by Harold Barban MD on 02/15/2021 at 9:25:47 PM.    Final    VAS [place] LOWER EXTREMITY VENOUS (DVT)  Result Date: 02/15/2021  Lower Venous DVT Study Indications: Fever of unknown origin.  Comparison Study: No previous exams Performing Technologist: Rogelia Rohrer  Examination Guidelines: A complete evaluation includes B-mode imaging, spectral Doppler, color Doppler, and power Doppler as needed of all accessible portions of each vessel. Bilateral testing is considered an integral part of a complete examination. Limited examinations for reoccurring indications may be performed as noted. The reflux portion of the exam is performed with the patient in reverse Trendelenburg.  +---------+---------------+---------+-----------+----------+--------------+ RIGHT    CompressibilityPhasicitySpontaneityPropertiesThrombus Aging +---------+---------------+---------+-----------+----------+--------------+ CFV      Full           Yes      Yes                                 +---------+---------------+---------+-----------+----------+--------------+ SFJ      Full                                                        +---------+---------------+---------+-----------+----------+--------------+ FV Prox  Full  Yes      Yes                                 +---------+---------------+---------+-----------+----------+--------------+ FV Mid   Full           Yes      Yes                                 +---------+---------------+---------+-----------+----------+--------------+ FV DistalFull           Yes      Yes                                 +---------+---------------+---------+-----------+----------+--------------+ PFV      Full                                                        +---------+---------------+---------+-----------+----------+--------------+ POP      Full           Yes      Yes                                  +---------+---------------+---------+-----------+----------+--------------+ PTV      Full                                                        +---------+---------------+---------+-----------+----------+--------------+ PERO     Full                                                        +---------+---------------+---------+-----------+----------+--------------+   +---------+---------------+---------+-----------+----------+--------------+ LEFT     CompressibilityPhasicitySpontaneityPropertiesThrombus Aging +---------+---------------+---------+-----------+----------+--------------+ CFV      Full           Yes      Yes                                 +---------+---------------+---------+-----------+----------+--------------+ SFJ      Full                                                        +---------+---------------+---------+-----------+----------+--------------+ FV Prox  Full           Yes      Yes                                 +---------+---------------+---------+-----------+----------+--------------+ FV Mid   Full           Yes      Yes                                 +---------+---------------+---------+-----------+----------+--------------+  FV DistalFull           Yes      Yes                                 +---------+---------------+---------+-----------+----------+--------------+ PFV      Full                                                        +---------+---------------+---------+-----------+----------+--------------+ POP      Full           Yes      Yes                                 +---------+---------------+---------+-----------+----------+--------------+ PTV      Full                                                        +---------+---------------+---------+-----------+----------+--------------+ PERO     Full                                                         +---------+---------------+---------+-----------+----------+--------------+     Summary: BILATERAL: - No evidence of deep vein thrombosis seen in the lower extremities, bilaterally. - No evidence of superficial venous thrombosis in the lower extremities, bilaterally. -No evidence of popliteal cyst, bilaterally.   *See table(s) above for measurements and observations. Electronically signed by Harold Barban MD on 02/15/2021 at 9:25:19 PM.    Final    MR Lumbar Spine W Wo Contrast  Result Date: 02/14/2021 CLINICAL DATA:  Low back pain EXAM: MRI LUMBAR SPINE WITHOUT AND WITH CONTRAST TECHNIQUE: Multiplanar and multiecho pulse sequences of the lumbar spine were obtained without and with intravenous contrast. CONTRAST:  34m GADAVIST GADOBUTROL 1 MMOL/ML IV SOLN COMPARISON:  Pain FINDINGS: Segmentation:  Standard. Alignment:  Grade 1 anterolisthesis at L4-5 Vertebrae:  No fracture, evidence of discitis, or bone lesion. Conus medullaris and cauda equina: Conus extends to the L1 level. Conus and cauda equina appear normal. Paraspinal and other soft tissues: Negative Disc levels: The disc levels above L4 are unremarkable. L4-5: Small disc bulge with severe facet hypertrophy. Severe spinal canal stenosis and severe left foraminal stenosis. L5-S1: Severe facet arthrosis and mild disc bulge. No spinal canal stenosis. Mild bilateral neural foraminal stenosis. IMPRESSION: 1. Severe spinal canal stenosis and left foraminal stenosis at L4-L5 due to combination of disc bulge and severe facet arthrosis. 2. Severe facet arthrosis at L5-S1 with mild bilateral neural foraminal stenosis. Electronically Signed   By: KUlyses JarredM.D.   On: 02/14/2021 22:15   DG Chest Port 1 View  Result Date: 02/14/2021 CLINICAL DATA:  Sepsis, back pain EXAM: PORTABLE CHEST 1 VIEW COMPARISON:  02/05/2021 FINDINGS: Prior CABG. Cardiomegaly. Vascular congestion. No overt edema, confluent opacities or effusions. No acute bony abnormality. IMPRESSION:  Cardiomegaly, vascular congestion. Electronically Signed  By: Rolm Baptise M.D.   On: 02/14/2021 19:58    Assessment & Plan:   Problem List Items Addressed This Visit     Aortic valve disorder    On Coumadin      Essential hypertension - Primary    On meds Monitor GFR Hydrate better      Stage 3a chronic kidney disease (HCC)    Monitor GFR Hydrate better      Urinary incontinence   Relevant Medications   mirabegron ER (MYRBETRIQ) 25 MG TB24 tablet   Other Relevant Orders   Urinalysis   Urinalysis      Meds ordered this encounter  Medications   DISCONTD: colchicine 0.6 MG tablet    Sig: Take 1 tablet (0.6 mg total) by mouth 2 (two) times daily. For 2 weeks for gout    Dispense:  180 tablet    Refill:  2   memantine (NAMENDA) 5 MG tablet    Sig: TAKE ONE TABLET BY MOUTH BY MOUTH TWICE DAILY    Dispense:  180 tablet    Refill:  3   mirabegron ER (MYRBETRIQ) 25 MG TB24 tablet    Sig: Take 1 tablet (25 mg total) by mouth daily.    Dispense:  90 tablet    Refill:  3   gabapentin (NEURONTIN) 100 MG capsule    Sig: Take 1 capsule (100 mg total) by mouth at bedtime.    Dispense:  90 capsule    Refill:  3   colchicine 0.6 MG tablet    Sig: Take two tablets prn gout attack.Then take another one in 1-2 hrs. Do not repeat for 3 days.    Dispense:  18 tablet    Refill:  1      Follow-up: Return in about 3 months (around 11/05/2022) for a follow-up visit.  Walker Kehr, MD

## 2022-08-05 NOTE — Addendum Note (Signed)
Addended by: Marcina Millard on: 08/05/2022 03:41 PM   Modules accepted: Orders

## 2022-08-05 NOTE — Assessment & Plan Note (Signed)
On meds Monitor GFR Hydrate better

## 2022-08-05 NOTE — Assessment & Plan Note (Signed)
Try Detrol LA prn if less $$$ than Myrbetriq

## 2022-08-05 NOTE — Assessment & Plan Note (Signed)
On Coumadin 

## 2022-08-05 NOTE — Assessment & Plan Note (Signed)
Monitor GFR Hydrate better 

## 2022-08-06 LAB — URINALYSIS, ROUTINE W REFLEX MICROSCOPIC
Bilirubin Urine: NEGATIVE
Ketones, ur: NEGATIVE
Leukocytes,Ua: NEGATIVE
Nitrite: NEGATIVE
Specific Gravity, Urine: 1.01 (ref 1.000–1.030)
Total Protein, Urine: NEGATIVE
Urine Glucose: NEGATIVE
Urobilinogen, UA: 0.2 (ref 0.0–1.0)
pH: 5.5 (ref 5.0–8.0)

## 2022-08-15 ENCOUNTER — Ambulatory Visit: Payer: Medicare HMO | Attending: Cardiovascular Disease

## 2022-08-15 DIAGNOSIS — Z7901 Long term (current) use of anticoagulants: Secondary | ICD-10-CM

## 2022-08-15 DIAGNOSIS — I4891 Unspecified atrial fibrillation: Secondary | ICD-10-CM | POA: Diagnosis not present

## 2022-08-15 LAB — POCT INR: INR: 4.1 — AB (ref 2.0–3.0)

## 2022-08-15 NOTE — Patient Instructions (Signed)
Description   HOLD today's dose and only take '5mg'$  tomorrow and then resume taking Warfarin '10mg'$  (white tablet) daily EXCEPT '15mg'$  every Sunday.  Recheck INR in 2 weeks. Stay consistent with greens 1-2 servings a week of greens.  [facility] [phone]

## 2022-08-21 ENCOUNTER — Other Ambulatory Visit: Payer: Self-pay

## 2022-08-21 ENCOUNTER — Other Ambulatory Visit: Payer: Self-pay | Admitting: *Deleted

## 2022-08-21 DIAGNOSIS — I4821 Permanent atrial fibrillation: Secondary | ICD-10-CM

## 2022-08-21 MED ORDER — DILTIAZEM HCL ER COATED BEADS 240 MG PO CP24: 240.0000 mg | ORAL_CAPSULE | Freq: Every day | ORAL | 2 refills | Status: DC

## 2022-08-21 MED ORDER — WARFARIN SODIUM 5 MG PO TABS: ORAL_TABLET | ORAL | 1 refills | Status: DC

## 2022-08-21 MED ORDER — POTASSIUM CHLORIDE CRYS ER 10 MEQ PO TBCR: 10.0000 meq | EXTENDED_RELEASE_TABLET | Freq: Two times a day (BID) | ORAL | 3 refills | Status: DC

## 2022-08-21 MED ORDER — PANTOPRAZOLE SODIUM 40 MG PO TBEC: DELAYED_RELEASE_TABLET | ORAL | 3 refills | Status: DC

## 2022-08-21 MED ORDER — METOPROLOL SUCCINATE ER 50 MG PO TB24: 25.0000 mg | ORAL_TABLET | Freq: Every day | ORAL | 2 refills | Status: DC

## 2022-08-21 MED ORDER — FUROSEMIDE 20 MG PO TABS: 20.0000 mg | ORAL_TABLET | Freq: Every morning | ORAL | 3 refills | Status: DC

## 2022-08-21 MED ORDER — WARFARIN SODIUM 10 MG PO TABS: ORAL_TABLET | ORAL | 1 refills | Status: DC

## 2022-08-21 NOTE — Addendum Note (Signed)
Addended by: Rollen Sox on: 08/21/2022 08:33 AM   Modules accepted: Orders

## 2022-08-21 NOTE — Addendum Note (Signed)
Addended by: Carter Kitten D on: 08/21/2022 08:30 AM   Modules accepted: Orders

## 2022-08-30 ENCOUNTER — Ambulatory Visit: Payer: Medicare HMO

## 2022-09-03 ENCOUNTER — Ambulatory Visit: Payer: Medicare HMO

## 2022-09-04 ENCOUNTER — Ambulatory Visit: Payer: Medicare HMO | Attending: Cardiovascular Disease

## 2022-09-04 DIAGNOSIS — Z9889 Other specified postprocedural states: Secondary | ICD-10-CM

## 2022-09-04 DIAGNOSIS — Z7901 Long term (current) use of anticoagulants: Secondary | ICD-10-CM

## 2022-09-04 DIAGNOSIS — I4891 Unspecified atrial fibrillation: Secondary | ICD-10-CM | POA: Diagnosis not present

## 2022-09-04 LAB — POCT INR: INR: 4 — AB (ref 2.0–3.0)

## 2022-09-04 NOTE — Patient Instructions (Signed)
Description   Only take '5mg'$  today and then START taking Warfarin '10mg'$  (white tablet) daily.  Recheck INR in 2 weeks. Stay consistent with greens 1-2 servings a week of greens.  [facility] [phone]

## 2022-09-18 ENCOUNTER — Ambulatory Visit: Payer: Medicare HMO

## 2022-10-01 ENCOUNTER — Ambulatory Visit: Payer: Medicare HMO | Attending: Cardiology

## 2022-10-01 DIAGNOSIS — Z7901 Long term (current) use of anticoagulants: Secondary | ICD-10-CM | POA: Diagnosis not present

## 2022-10-01 DIAGNOSIS — Z954 Presence of other heart-valve replacement: Secondary | ICD-10-CM | POA: Diagnosis not present

## 2022-10-01 DIAGNOSIS — I4891 Unspecified atrial fibrillation: Secondary | ICD-10-CM | POA: Diagnosis not present

## 2022-10-01 DIAGNOSIS — Z9889 Other specified postprocedural states: Secondary | ICD-10-CM | POA: Diagnosis not present

## 2022-10-01 DIAGNOSIS — Z5181 Encounter for therapeutic drug level monitoring: Secondary | ICD-10-CM

## 2022-10-01 LAB — POCT INR: INR: 2.2 (ref 2.0–3.0)

## 2022-10-01 NOTE — Patient Instructions (Signed)
TAKE 1.5 TABLETS TODAY ONLY AND THEN CONTINUE taking Warfarin '10mg'$  (white tablet) daily.  Recheck INR in 3 weeks. Stay consistent with greens 1-2 servings a week of greens.  [facility] [phone]

## 2022-10-03 NOTE — Progress Notes (Signed)
0 Cardiology Office Note:    Date:  10/14/2022   ID:  DEVERY ODWYER, DOB 1941-04-30, MRN [id number]  PCP:  Cassandria Anger, MD   [facility] Providers Cardiologist:  Jenkins Rouge, MD     Referring MD: Cassandria Anger, MD    History of Present Illness:    Beth Miller is a very pleasant 81 y.o. female with a hx of longstanding atrial fib, mechanical MVR on chronic Coumadin, former smoker with COPD, DM, PVD, aortic atherosclerosis seen on CT, and carotid artery disease. She requires life-time SBE prophylaxis. Had childhood rheumatic fever that required hospitalization.   Prosthetic mitral valve replacement 1995, on coumadin. She established care with our group prior to 2013 and has maintained regular follow-up. No documentation of CAD, low risk myoview 2014.   In January 2022 she had digoxin toxicity with level over 3 and junctional bigeminy on EKG which resolved with d/c of digoxin.  Post monitor 12/12/2020 shows A-fib with rates in the 70s, no malignant ventricular beats or junctional rhythm.  In April 2022 she was hospitalized with sepsis unknown source and atrial fibrillation rates were elevated during admission.  Medical management with Cardizem and Toprol. TEE was advised but patient left before it could be completed. TTE 02/10/21 revealed EF 55%, severe biatrial enlargement, stable MVR Saint Jude mechanical valve with mean gradient 4, trivial MR, no obvious SBE, moderate to severe TR.   She has had some LE edema improved with lasix She has seen Dr Radford Pax for severe OSA but has issues wearing CPAP mask She does not want to repeat study or wear mask  Has recurrent gout in right great toe Painful  She has had a congestive cough and URI seen in ED 10/09/22 and resp panel negative CXR CE chronic bronchitic changes Given prednisone 10 mg to take daily    Past Medical History:  Diagnosis Date   AF (atrial fibrillation) ([place])    ALLERGIC RHINITIS    Blood transfusion  without reported diagnosis    Cataract, senile    Chest pain    CHF (congestive heart failure) ([facility])    Chronic anticoagulation    Diabetes mellitus    Diabetes mellitus    Disorder of oral soft tissue    of mouth   Diverticulosis    GERD (gastroesophageal reflux disease)    Hematoma    Hypertension    Leg pain    Low back pain    Paresthesia    Rheumatic fever    Status post mitral valve replacement    St. Jude valve   Stroke ([place])    tia   Sweating    Vitamin D deficiency     Past Surgical History:  Procedure Laterality Date   ABDOMINAL HYSTERECTOMY     CARDIAC VALVE REPLACEMENT  1995   Mitral valve prosthesis; st jude   CHOLECYSTECTOMY      Current Medications: Current Meds  Medication Sig   Accu-Chek Softclix Lancets lancets 1 each by Other route 2 (two) times daily. Use to check blood sugars twice a day   acetaminophen (TYLENOL) 500 MG tablet Take 1,000 mg by mouth every 6 (six) hours as needed for mild pain.   Alcohol Swabs (B-D SINGLE USE SWABS REGULAR) PADS 1 each by Does not apply route 2 (two) times daily. Use to clean finger to check blood sugars twice a day   benzonatate (TESSALON) 100 MG capsule Take 1 capsule (100 mg total) by mouth every 8 (  eight) hours.   Cholecalciferol (VITAMIN D3) 1000 UNIT tablet Take 1,000 Units by mouth daily.   colchicine 0.6 MG tablet Take two tablets prn gout attack.Then take another one in 1-2 hrs. Do not repeat for 3 days.   cyclobenzaprine (FLEXERIL) 10 MG tablet take 1/2 to 1 TABLET BY MOUTH THREE TIMES DAILY AS NEEDED FOR MUSCLE SPASMS   diltiazem (CARDIZEM CD) 240 MG 24 hr capsule Take 1 capsule (240 mg total) by mouth daily.   famotidine (PEPCID) 40 MG tablet Take 1 tablet (40 mg total) by mouth at bedtime.   furosemide (LASIX) 20 MG tablet Take 1 tablet (20 mg total) by mouth every morning.   furosemide (LASIX) 40 MG tablet Take 1 tablet (40 mg total) by mouth daily.   gabapentin (NEURONTIN) 100 MG capsule Take 1 capsule  (100 mg total) by mouth at bedtime.   glucose blood (ACCU-CHEK AVIVA PLUS) test strip TEST BLOOD SUGAR TWICE DAILY   Lancet Devices (ACCU-CHEK SOFTCLIX) lancets 1 each by Other route 2 (two) times daily. Use as instructed   loratadine (ALLERGY RELIEF) 10 MG tablet TAKE ONE TABLET BY MOUTH EVERY DAY   memantine (NAMENDA) 5 MG tablet TAKE ONE TABLET BY MOUTH BY MOUTH TWICE DAILY   metoprolol succinate (TOPROL-XL) 50 MG 24 hr tablet Take 0.5 tablets (25 mg total) by mouth daily.   mirabegron ER (MYRBETRIQ) 25 MG TB24 tablet Take 1 tablet (25 mg total) by mouth daily.   pantoprazole (PROTONIX) 40 MG tablet TAKE ONE TABLET BY MOUTH BY MOUTH EVERY MORNING   potassium chloride (KLOR-CON M) 10 MEQ tablet Take 1 tablet (10 mEq total) by mouth 2 (two) times daily.   tolterodine (DETROL LA) 4 MG 24 hr capsule Take 1 capsule (4 mg total) by mouth daily.   vitamin B-12 (CYANOCOBALAMIN) 1000 MCG tablet Take 1 tablet (1,000 mcg total) by mouth daily.   warfarin (COUMADIN) 10 MG tablet TAKE 1 TABLET BY MOUTH DAILY OR AS DIRECTED BY COUMADIN CLINIC   warfarin (COUMADIN) 5 MG tablet TAKE 1 TABLET TWICE WEEKLY ALONG WITH '10MG'$  DOSE OR AS ADVISED BY COUMADIN CLINIC     Allergies:   Aricept [donepezil hcl], Food, Furosemide, Metformin and related, Quinine, Spironolactone, Tramadol, and Sulfadiazine   Social History   Socioeconomic History   Marital status: Married    Spouse name: Not on file   Number of children: 1   Years of education: Not on file   Highest education level: Not on file  Occupational History   Occupation: Retired    Fish farm manager: RETIRED  Tobacco Use   Smoking status: Former    Packs/day: 0.02    Years: 60.00    Total pack years: 1.20    Types: Cigarettes    Quit date: 10/28/2002    Years since quitting: 19.9    Passive exposure: Never   Smokeless tobacco: Never   Tobacco comments:    quit x 13 years   Vaping Use   Vaping Use: Never used  Substance and Sexual Activity   Alcohol use:  No    Alcohol/week: 0.0 standard drinks of alcohol   Drug use: No   Sexual activity: Yes    Partners: Male  Other Topics Concern   Not on file  Social History Narrative   Negative Family History of Colon Cancer      Regular exercise-yes, bowling      Daily caffeine Use-rare   Social Determinants of Health   Financial Resource Strain: Low Risk  (  12/18/2021)   Overall Financial Resource Strain (CARDIA)    Difficulty of Paying Living Expenses: Not hard at all  Food Insecurity: No Food Insecurity (12/18/2021)   Hunger Vital Sign    Worried About Running Out of Food in the Last Year: Never true    Ran Out of Food in the Last Year: Never true  Transportation Needs: No Transportation Needs (12/18/2021)   PRAPARE - Hydrologist (Medical): No    Lack of Transportation (Non-Medical): No  Physical Activity: Sufficiently Active (12/18/2021)   Exercise Vital Sign    Days of Exercise per Week: 5 days    Minutes of Exercise per Session: 30 min  Stress: No Stress Concern Present (12/18/2021)   [place]    Feeling of Stress : Not at all  Social Connections: [place] (12/18/2021)   Social Connection and Isolation Panel [NHANES]    Frequency of Communication with Friends and Family: More than three times a week    Frequency of Social Gatherings with Friends and Family: More than three times a week    Attends Religious Services: More than 4 times per year    Active Member of [employer] or Organizations: Yes    Attends Music therapist: More than 4 times per year    Marital Status: Married     Family History: The patient's family history includes Diabetes in her brother and mother; Heart disease in her brother and brother; Other in her mother; Pancreatic cancer in her brother; Seizures in her mother. There is no history of Colon cancer.  ROS:   Please see the history of present  illness.    + fatigue All other systems reviewed and are negative.  Labs/Other Studies Reviewed:    The following studies were reviewed today:  Echo 02/10/21  Left Ventricle: Left ventricular ejection fraction, by estimation, is  55%%. The left ventricle has low normal function. The left ventricle has  no regional wall motion abnormalities. Definity contrast agent was given  IV to delineate the left ventricular  endocardial borders. The left ventricular internal cavity size was normal  in size. There is mild left ventricular hypertrophy. Left ventricular  diastolic parameters are indeterminate.  Right Ventricle: The right ventricular size is normal. Right vetricular  wall thickness was not assessed. Right ventricular systolic function is  low normal. There is moderately elevated pulmonary artery systolic  pressure. The tricuspid regurgitant  velocity is 3.07 m/s, and with an assumed right atrial pressure of 15  mmHg, the estimated right ventricular systolic pressure is 87.5 mmHg.  Left Atrium: Left atrial size was severely dilated.  Right Atrium: Right atrial size was severely dilated.  Pericardium: Trivial pericardial effusion is present.  Mitral Valve: MV prosthesis (St. Jude mechanical) is well seated, appears  to open well. mean gradient through the valve is approximately 4 mm Hg.  The mitral valve has been repaired/replaced. Trivial mitral valve  regurgitation. There is a St. Jude  mechanical valve present in the mitral position. The mean mitral valve  gradient is 4.0 mmHg.  Tricuspid Valve: TR is eccentric, directed toward the interatrial septum.  The tricuspid valve is normal in structure. Tricuspid valve regurgitation  is moderate to severe.  Aortic Valve: The aortic valve is tricuspid. Aortic valve regurgitation is  not visualized. Mild to moderate aortic valve sclerosis/calcification is  present, without any evidence of aortic stenosis. Aortic valve mean  gradient  measures 5.4  mmHg. Aortic  valve peak gradient measures 9.4 mmHg. Aortic valve area, by VTI measures  1.38 cm.  Pulmonic Valve: The pulmonic valve was normal in structure. Pulmonic valve  regurgitation is not visualized.  Aorta: The aortic root is normal in size and structure and aortic  dilatation noted. There is mild dilatation of the ascending aorta,  measuring 40 mm.  Venous: The inferior vena cava is dilated in size with less than 50%  respiratory variability, suggesting right atrial pressure of 15 mmHg.  IAS/Shunts: No atrial level shunt detected by color flow Doppler.   Cardiac monitor 11/2020  Frequent episodes of atrial flutter PVCls occasional Does have episodes of conversion    Myocardial Perfusion Imaging 12/2012 Normal perfusion, low risk   Recent Labs: 11/15/2021: NT-Pro BNP 274 02/26/2022: ALT 10; TSH 2.42 10/09/2022: BUN 13; Creatinine, Ser 1.25; Hemoglobin 11.6; Platelets 240; Potassium 3.8; Sodium 137  Recent Lipid Panel    Component Value Date/Time   CHOL 116 01/22/2022 1120   TRIG 92 01/22/2022 1120   HDL 47 01/22/2022 1120   CHOLHDL 2.5 01/22/2022 1120   CHOLHDL 3 04/27/2019 1041   VLDL 35.4 04/27/2019 1041   LDLCALC 51 01/22/2022 1120     Risk Assessment/Calculations:    CHA2DS2-VASc Score = 6  {This indicates a 9.7% annual risk of stroke. The patient's score is based upon: CHF History: 0 HTN History: 1 Diabetes History: 1 Stroke History: 0 Vascular Disease History: 1 Age Score: 2 Gender Score: 1    Physical Exam:    VS:  BP 112/72   Pulse (!) 42   Ht '5\' 2"'$  (1.575 m)   Wt 190 lb (86.2 kg)   LMP  (LMP Unknown)   SpO2 96%   BMI 34.75 kg/m     Wt Readings from Last 3 Encounters:  10/14/22 190 lb (86.2 kg)  08/05/22 187 lb (84.8 kg)  07/31/22 198 lb (89.8 kg)     Affect appropriate Overweight black female  HEENT: normal Neck supple with no adenopathy JVP normal no bruits no thyromegaly Lungs  rhonchi and expitory wheezing   Heart:  R4/ click S2 no murmur, no rub, gallop or click PMI normal Abdomen: benighn, BS positve, no tenderness, no AAA no bruit.  No HSM or HJR Distal pulses intact with no bruits Trace  edema Neuro non-focal Gout with swelling/erythema right > toe    EKG:   05/14/21 afib rate 112 nonspecific ST changes  10/14/2022 afib rate 90 nonspecific ST changes   Diagnoses:    No diagnosis found.   Assessment and Plan:     Mitral valve disese, rheumatic/Hx MVR on chronic anticoagulation: Mitral valve replaced 1995.  Normal valve click without signs and symptoms of worsening valve function. Normal valve function by echo 01/2021. Management of coumadin by our coumadin clinic.   Dyspnea: LVEF 55%, no rwma, mild LVH, indeterminate diastolic parameters by echo 01/2021. Lasix dose increased BNP was normal 11/15/21 Myovue done 01/31/22 no ischemia stable   OSA: re established with Dr Radford Pax severe OSA compliance with mask discussed   Permanent atrial fibrillation: Severe bi-atrial enlargement by echo 01/2021. Dig toxicity in past beta blocker decreased by primary 04/09/22 for low pulse  She will take Cardizem in am and Toprol at lunch   Aortic atherosclerosis/Hyperlipidemia LDL goal < 70: LDL   51 on current statin Rx   Fatigue: related to age, afib and poor compliance with CPAP   DM:  metformin d/c due to stomach upset A1c 8.2  f/u Dr Laurian Brim Started on Prandin   GERD:  not much better off Metformin Creon did not help PRN Imodium Continue Protonix and pepcid   Urinary Incontinence:  continue Myrbetriq  Gout:  left great toe f/u Plotnicov post Rx with colchicine Uric acid elevated 7.5 f/u primary consider allopurinol   URI:  flu/covid/RSV negative congestive cough on prednisone per ER CXR ok mild leukocytosis f/u Dr Laurian Brim   Disposition: F/U in  6 months      Signed, Jenkins Rouge, MD  10/14/2022 10:32 AM    [place]

## 2022-10-09 ENCOUNTER — Other Ambulatory Visit: Payer: Self-pay

## 2022-10-09 ENCOUNTER — Encounter (HOSPITAL_COMMUNITY): Payer: Self-pay | Admitting: Emergency Medicine

## 2022-10-09 ENCOUNTER — Emergency Department (HOSPITAL_COMMUNITY)
Admission: EM | Admit: 2022-10-09 | Discharge: 2022-10-09 | Disposition: A | Discharge status: Home / Self Care | Payer: Medicare HMO | Attending: Emergency Medicine | Admitting: Emergency Medicine

## 2022-10-09 ENCOUNTER — Emergency Department (HOSPITAL_COMMUNITY): Payer: Medicare HMO

## 2022-10-09 DIAGNOSIS — I6782 Cerebral ischemia: Secondary | ICD-10-CM | POA: Insufficient documentation

## 2022-10-09 DIAGNOSIS — J069 Acute upper respiratory infection, unspecified: Secondary | ICD-10-CM | POA: Insufficient documentation

## 2022-10-09 DIAGNOSIS — Z7984 Long term (current) use of oral hypoglycemic drugs: Secondary | ICD-10-CM | POA: Insufficient documentation

## 2022-10-09 DIAGNOSIS — Z1152 Encounter for screening for COVID-19: Secondary | ICD-10-CM | POA: Insufficient documentation

## 2022-10-09 DIAGNOSIS — I509 Heart failure, unspecified: Secondary | ICD-10-CM | POA: Insufficient documentation

## 2022-10-09 DIAGNOSIS — I110 Hypertensive heart disease with heart failure: Secondary | ICD-10-CM | POA: Diagnosis not present

## 2022-10-09 DIAGNOSIS — B9789 Other viral agents as the cause of diseases classified elsewhere: Secondary | ICD-10-CM | POA: Diagnosis not present

## 2022-10-09 DIAGNOSIS — E119 Type 2 diabetes mellitus without complications: Secondary | ICD-10-CM | POA: Diagnosis not present

## 2022-10-09 DIAGNOSIS — R059 Cough, unspecified: Secondary | ICD-10-CM | POA: Diagnosis not present

## 2022-10-09 DIAGNOSIS — Z7901 Long term (current) use of anticoagulants: Secondary | ICD-10-CM | POA: Diagnosis not present

## 2022-10-09 DIAGNOSIS — R079 Chest pain, unspecified: Secondary | ICD-10-CM | POA: Diagnosis not present

## 2022-10-09 DIAGNOSIS — J9811 Atelectasis: Secondary | ICD-10-CM | POA: Diagnosis not present

## 2022-10-09 DIAGNOSIS — R519 Headache, unspecified: Secondary | ICD-10-CM | POA: Diagnosis not present

## 2022-10-09 DIAGNOSIS — I10 Essential (primary) hypertension: Secondary | ICD-10-CM | POA: Diagnosis not present

## 2022-10-09 LAB — BASIC METABOLIC PANEL
Anion gap: 12 (ref 5–15)
BUN: 13 mg/dL (ref 8–23)
CO2: 26 mmol/L (ref 22–32)
Calcium: 9.4 mg/dL (ref 8.9–10.3)
Chloride: 99 mmol/L (ref 98–111)
Creatinine, Ser: 1.25 mg/dL — ABNORMAL HIGH (ref 0.44–1.00)
GFR, Estimated: 43 mL/min — ABNORMAL LOW (ref >60)
Glucose, Bld: 153 mg/dL — ABNORMAL HIGH (ref 70–99)
Potassium: 3.8 mmol/L (ref 3.5–5.1)
Sodium: 137 mmol/L (ref 135–145)

## 2022-10-09 LAB — CBC WITH DIFFERENTIAL/PLATELET
Abs Immature Granulocytes: 0.06 10*3/uL (ref 0.00–0.07)
Basophils Absolute: 0.1 10*3/uL (ref 0.0–0.1)
Basophils Relative: 1 %
Eosinophils Absolute: 0 10*3/uL (ref 0.0–0.5)
Eosinophils Relative: 0 %
HCT: 33.5 % — ABNORMAL LOW (ref 36.0–46.0)
Hemoglobin: 11.6 g/dL — ABNORMAL LOW (ref 12.0–15.0)
Immature Granulocytes: 1 %
Lymphocytes Relative: 11 %
Lymphs Abs: 1.2 10*3/uL (ref 0.7–4.0)
MCH: 27.1 pg (ref 26.0–34.0)
MCHC: 34.6 g/dL (ref 30.0–36.0)
MCV: 78.3 fL — ABNORMAL LOW (ref 80.0–100.0)
Monocytes Absolute: 1.5 10*3/uL — ABNORMAL HIGH (ref 0.1–1.0)
Monocytes Relative: 14 %
Neutro Abs: 7.8 10*3/uL — ABNORMAL HIGH (ref 1.7–7.7)
Neutrophils Relative %: 73 %
Platelets: 240 10*3/uL (ref 150–400)
RBC: 4.28 MIL/uL (ref 3.87–5.11)
RDW: 17.3 % — ABNORMAL HIGH (ref 11.5–15.5)
WBC: 10.6 10*3/uL — ABNORMAL HIGH (ref 4.0–10.5)
nRBC: 0 % (ref 0.0–0.2)

## 2022-10-09 LAB — RESP PANEL BY RT-PCR (RSV, FLU A&B, COVID)  RVPGX2
Influenza A by PCR: NEGATIVE
Influenza B by PCR: NEGATIVE
Resp Syncytial Virus by PCR: NEGATIVE
SARS Coronavirus 2 by RT PCR: NEGATIVE

## 2022-10-09 MED ORDER — PREDNISONE 10 MG PO TABS: 20.0000 mg | ORAL_TABLET | Freq: Every day | ORAL | 0 refills | Status: DC

## 2022-10-09 MED ORDER — SODIUM CHLORIDE 0.9 % IV BOLUS: 500.0000 mL | Freq: Once | INTRAVENOUS | Status: DC

## 2022-10-09 MED ORDER — ACETAMINOPHEN 500 MG PO TABS
1000.0000 mg | ORAL_TABLET | Freq: Once | ORAL | Status: AC
  Administered 2022-10-09: 1000 mg via ORAL
  Filled 2022-10-09: qty 2

## 2022-10-09 MED ORDER — BENZONATATE 100 MG PO CAPS: 100.0000 mg | ORAL_CAPSULE | Freq: Three times a day (TID) | ORAL | 0 refills | Status: DC

## 2022-10-09 NOTE — ED Provider Triage Note (Signed)
Emergency Medicine Provider Triage Evaluation Note  Beth Miller , a 81 y.o. female  was evaluated in triage.  Pt complains of headache, productive cough that has been ongoing for about 3 to 4 days.  She is concerned regarding pneumonia.  Denies chest pain.  Review of Systems  Positive: As above Negative: As above  Physical Exam  BP 111/65   Pulse 87   Temp 98.6 F (37 C)   Resp 15   LMP  (LMP Unknown)   SpO2 91%  Gen:   Awake, no distress   Resp:  Normal effort  MSK:   Moves extremities without difficulty Other:    Medical Decision Making  Medically screening exam initiated at 11:57 AM.  Appropriate orders placed.  KEALEY KEMMER was informed that the remainder of the evaluation will be completed by another provider, this initial triage assessment does not replace that evaluation, and the importance of remaining in the ED until their evaluation is complete.     Evlyn Courier, PA-C 10/09/22 1158

## 2022-10-09 NOTE — ED Provider Notes (Signed)
[facility] EMERGENCY DEPARTMENT Provider Note   CSN: [id number] Arrival date & time: 10/09/22  1103     History  Chief Complaint  Patient presents with   Cough   Generalized Body Aches    Beth Miller is a 81 y.o. female with a past medical history of A-fib on Coumadin, CHF, diabetes mellitus, diverticulosis, GERD, hypertension, CVA, status post mitral valve replacement presenting to the emergency room for evaluation of flulike symptoms.  Patient states that she has had headache and worsening productive cough in the last 5 days.  She also reports runny nose, nasal congestion, abdominal pain associate with cough.  No fever, nausea, vomiting, bowel changes, urinary symptoms, rash.  She is not sure which if she is around her son who is sick with flu or COVID.  Denies chest pain or shortness of breath.   Cough     Past Medical History:  Diagnosis Date   AF (atrial fibrillation) (HCC)    ALLERGIC RHINITIS    Blood transfusion without reported diagnosis    Cataract, senile    Chest pain    CHF (congestive heart failure) (HCC)    Chronic anticoagulation    Diabetes mellitus    Diabetes mellitus    Disorder of oral soft tissue    of mouth   Diverticulosis    GERD (gastroesophageal reflux disease)    Hematoma    Hypertension    Leg pain    Low back pain    Paresthesia    Rheumatic fever    Status post mitral valve replacement    St. Jude valve   Stroke ([place])    tia   Sweating    Vitamin D deficiency    Past Surgical History:  Procedure Laterality Date   ABDOMINAL HYSTERECTOMY     CARDIAC VALVE REPLACEMENT  1995   Mitral valve prosthesis; st jude   CHOLECYSTECTOMY       Home Medications Prior to Admission medications   Medication Sig Start Date End Date Taking? Authorizing Provider  predniSONE (DELTASONE) 10 MG tablet Take 2 tablets (20 mg total) by mouth daily. 10/09/22  Yes Rex Kras, PA  Accu-Chek Softclix Lancets lancets 1 each by Other  route 2 (two) times daily. Use to check blood sugars twice a day 09/14/21   Plotnikov, Evie Lacks, MD  acetaminophen (TYLENOL) 500 MG tablet Take 1,000 mg by mouth every 6 (six) hours as needed for mild pain.    [provider]  Alcohol Swabs (B-D SINGLE USE SWABS REGULAR) PADS 1 each by Does not apply route 2 (two) times daily. Use to clean finger to check blood sugars twice a day 09/14/21   Plotnikov, Evie Lacks, MD  amoxicillin (AMOXIL) 500 MG tablet Take four capsules (2,000 mg) one hour prior to all dental visits. 11/25/19   Josue Hector, MD  Cholecalciferol (VITAMIN D3) 1000 UNIT tablet Take 1,000 Units by mouth daily.    [provider]  colchicine 0.6 MG tablet Take two tablets prn gout attack.Then take another one in 1-2 hrs. Do not repeat for 3 days. 08/05/22   Plotnikov, Evie Lacks, MD  cyclobenzaprine (FLEXERIL) 10 MG tablet take 1/2 to 1 TABLET BY MOUTH THREE TIMES DAILY AS NEEDED FOR MUSCLE SPASMS 09/14/21   Plotnikov, Evie Lacks, MD  diltiazem (CARDIZEM CD) 240 MG 24 hr capsule Take 1 capsule (240 mg total) by mouth daily. 08/21/22   Josue Hector, MD  famotidine (PEPCID) 40 MG tablet Take  1 tablet (40 mg total) by mouth at bedtime. 04/09/22   Plotnikov, Evie Lacks, MD  furosemide (LASIX) 20 MG tablet Take 1 tablet (20 mg total) by mouth every morning. 08/21/22   Plotnikov, Evie Lacks, MD  furosemide (LASIX) 40 MG tablet Take 1 tablet (40 mg total) by mouth daily. 01/22/22 01/23/23  Swinyer, Lanice Schwab, NP  gabapentin (NEURONTIN) 100 MG capsule Take 1 capsule (100 mg total) by mouth at bedtime. 08/05/22   Plotnikov, Evie Lacks, MD  glucose blood (ACCU-CHEK AVIVA PLUS) test strip TEST BLOOD SUGAR TWICE DAILY 07/22/22   Plotnikov, Evie Lacks, MD  Lancet Devices [facility]) lancets 1 each by Other route 2 (two) times daily. Use as instructed 12/04/15   Plotnikov, Evie Lacks, MD  loratadine (ALLERGY RELIEF) 10 MG tablet TAKE ONE TABLET BY MOUTH EVERY DAY 03/04/22   Plotnikov,  Evie Lacks, MD  memantine (NAMENDA) 5 MG tablet TAKE ONE TABLET BY MOUTH BY MOUTH TWICE DAILY 08/05/22   Plotnikov, Evie Lacks, MD  metoprolol succinate (TOPROL-XL) 50 MG 24 hr tablet Take 0.5 tablets (25 mg total) by mouth daily. 08/21/22   Josue Hector, MD  mirabegron ER (MYRBETRIQ) 25 MG TB24 tablet Take 1 tablet (25 mg total) by mouth daily. 08/05/22   Plotnikov, Evie Lacks, MD  pantoprazole (PROTONIX) 40 MG tablet TAKE ONE TABLET BY MOUTH BY MOUTH EVERY MORNING 08/21/22   Plotnikov, Evie Lacks, MD  potassium chloride (KLOR-CON M) 10 MEQ tablet Take 1 tablet (10 mEq total) by mouth 2 (two) times daily. 08/21/22   Plotnikov, Evie Lacks, MD  tolterodine (DETROL LA) 4 MG 24 hr capsule Take 1 capsule (4 mg total) by mouth daily. 08/05/22   Plotnikov, Evie Lacks, MD  vitamin B-12 (CYANOCOBALAMIN) 1000 MCG tablet Take 1 tablet (1,000 mcg total) by mouth daily. 09/14/21   Plotnikov, Evie Lacks, MD  warfarin (COUMADIN) 10 MG tablet Take 1 tablet by mouth daily or as directed by [facility] 08/21/22   Josue Hector, MD  warfarin (COUMADIN) 5 MG tablet TAKE 1 TABLET TWICE WEEKLY ALONG WITH '10MG'$  DOSE OR AS ADVISED BY COUMADIN CLINIC 08/21/22   Josue Hector, MD      Allergies    Aricept Reather Littler hcl], Food, Furosemide, Metformin and related, Quinine, Spironolactone, Tramadol, and Sulfadiazine    Review of Systems   Review of Systems  Respiratory:  Positive for cough.     Physical Exam Updated Vital Signs BP 115/70   Pulse 84   Temp 98.2 F (36.8 C)   Resp 18   LMP  (LMP Unknown)   SpO2 98%  Physical Exam Vitals and nursing note reviewed.  Constitutional:      Appearance: Normal appearance.  HENT:     Head: Normocephalic and atraumatic.     Mouth/Throat:     Mouth: Mucous membranes are moist.  Eyes:     General: No scleral icterus. Cardiovascular:     Rate and Rhythm: Normal rate and regular rhythm.     Pulses: Normal pulses.     Heart sounds: Normal heart sounds.  Pulmonary:      Effort: Pulmonary effort is normal.     Breath sounds: Normal breath sounds.  Abdominal:     General: Abdomen is flat.     Palpations: Abdomen is soft.     Tenderness: There is no abdominal tenderness.  Musculoskeletal:        General: No deformity.  Skin:    General: Skin is warm.  Findings: No rash.  Neurological:     General: No focal deficit present.     Mental Status: She is alert.  Psychiatric:        Mood and Affect: Mood normal.     ED Results / Procedures / Treatments   Labs (all labs ordered are listed, but only abnormal results are displayed) Labs Reviewed  CBC WITH DIFFERENTIAL/PLATELET - Abnormal; Notable for the following components:      Result Value   WBC 10.6 (*)    Hemoglobin 11.6 (*)    HCT 33.5 (*)    MCV 78.3 (*)    RDW 17.3 (*)    Neutro Abs 7.8 (*)    Monocytes Absolute 1.5 (*)    All other components within normal limits  BASIC METABOLIC PANEL - Abnormal; Notable for the following components:   Glucose, Bld 153 (*)    Creatinine, Ser 1.25 (*)    GFR, Estimated 43 (*)    All other components within normal limits  RESP PANEL BY RT-PCR (RSV, FLU A&B, COVID)  [id number]    EKG None  Radiology CT Head Wo Contrast  Result Date: 10/09/2022 CLINICAL DATA:  Headache, adache. EXAM: CT HEAD WITHOUT CONTRAST TECHNIQUE: Contiguous axial images were obtained from the base of the skull through the vertex without intravenous contrast. RADIATION DOSE REDUCTION: This exam was performed according to the departmental dose-optimization program which includes automated exposure control, adjustment of the mA and/or kV according to patient size and/or use of iterative reconstruction technique. COMPARISON:  Head CTA 09/10/2014 FINDINGS: Brain: There is no evidence of an acute infarct, intracranial hemorrhage, mass, midline shift, or extra-axial fluid collection. Mild cerebral atrophy has mildly progressed. Hypodensities in the cerebral white matter bilaterally have  also progressed and are nonspecific but compatible with mild chronic small vessel ischemic disease. A small chronic cortical/subcortical infarct in the right occipital lobe is unchanged. Vascular: No hyperdense vessel. Skull: No fracture or suspicious osseous lesion. Sinuses/Orbits: Visualized paranasal sinuses and mastoid air cells are clear. Bilateral cataract extraction. Other: None. IMPRESSION: 1. No evidence of acute intracranial abnormality. 2. Mild chronic small vessel ischemic disease. Electronically Signed   By: Logan Bores M.D.   On: 10/09/2022 18:37   DG Chest 2 View  Result Date: 10/09/2022 CLINICAL DATA:  Dry cough for 10 days, productive cough for 4-5 days, chest pain from coughing, former smoker, diabetes mellitus, hypertension EXAM: CHEST - 2 VIEW COMPARISON:  02/14/2021 FINDINGS: Enlargement of cardiac silhouette post median sternotomy. Mediastinal contours and pulmonary vascularity normal. Atherosclerotic calcification aorta. Minimal bibasilar atelectasis and mild central chronic bronchitic changes. No definite infiltrate, pleural effusion, or pneumothorax. Bones demineralized IMPRESSION: Enlargement of cardiac silhouette. Chronic bronchitic changes and minimal bibasilar atelectasis. Aortic Atherosclerosis (ICD10-I70.0). Electronically Signed   By: Lavonia Dana M.D.   On: 10/09/2022 13:15    Procedures Procedures    Medications Ordered in ED Medications  acetaminophen (TYLENOL) tablet 1,000 mg (1,000 mg Oral Given 10/09/22 1714)    ED Course/ Medical Decision Making/ A&P                           Medical Decision Making Amount and/or Complexity of Data Reviewed Radiology: ordered.  Risk OTC drugs. Prescription drug management.   This patient presents to the ED for headache, cough, this involves an extensive number of treatment options, and is a complaint that carries with a high risk of complications and morbidity.  The differential diagnosis  includes flu, COVID,  bronchitis, pharyngitis, pneumonia, infectious etiology.  This is not an exhaustive list.  Comorbidities that complicate the patient evaluation See HPI  Social determinants of health NA  Additional history obtained: External records from outside source obtained and reviewed including: Chart review including previous notes, labs, imaging.  Cardiac monitoring/EKG: The patient was maintained on a cardiac monitor.  I personally reviewed and interpreted the cardiac monitor which showed an underlying rhythm of: Sinus rhythm.  Lab tests: I ordered and personally interpreted labs.  The pertinent results include: Mild leukocytosis 10.6. Hbg unremarkable. Platelets unremarkable. No electrolyte abnormalities noted. BUN, creatinine 1.25.  Imaging studies: I ordered imaging studies. I personally reviewed, interpreted imaging and agree with the radiologist's interpretations. Findings include: X-ray with no acute abnormalities.  Problem list/ ED course/ Critical interventions/ Medical management: HPI: See above Vital signs within normal range and stable throughout visit. Laboratory/imaging studies significant for: See above. On physical examination, patient is afebrile and appears in no acute distress.  Heart sounds normal.  Lungs are clear, no wheezes or rales.  Respiratory panel negative for COVID, flu, RSV.  Laboratory studies significant for creatinine increased to 1.25.  Based on patient's clinical presentations and laboratory/imaging studies I suspect upper respiratory infection.  Patient also complained of some headache.  I ordered Tylenol.  I sent an Rx of Tessalon Perle for cough.  Advised patient to follow-up with primary care physician for further evaluation and management.  Return to ER if new or worsening symptoms. I have reviewed the patient home medicines and have made adjustments as needed.  Consultations obtained: I requested consultation with Dr. Doren Custard, and discussed lab and imaging  findings as well as pertinent plan.  He/she agrees with the plan.  Disposition Continued outpatient therapy. Follow-up with PCP recommended for reevaluation of symptoms. Treatment plan discussed with patient.  Pt acknowledged understanding was agreeable to the plan. Worrisome signs and symptoms were discussed with patient, and patient acknowledged understanding to return to the ED if they noticed these signs and symptoms. Patient was stable upon discharge.   This chart was dictated using voice recognition software.  Despite best efforts to proofread,  errors can occur which can change the documentation meaning.          Final Clinical Impression(s) / ED Diagnoses Final diagnoses:  Viral upper respiratory tract infection    Rx / DC Orders ED Discharge Orders          Ordered    predniSONE (DELTASONE) 10 MG tablet  Daily        10/09/22 1719              Rex Kras, PA 10/10/22 1037    Godfrey Pick, MD 10/15/22 [phone]

## 2022-10-09 NOTE — Discharge Instructions (Addendum)
Please take your medications as prescribed. Please take tylenol/ibuprofen for pain. I recommend close follow-up with PCP for reevaluation.  Please do not hesitate to return to emergency department if worrisome signs symptoms we discussed become apparent.

## 2022-10-09 NOTE — ED Notes (Signed)
Per LE PA, patient may have PO fluids at this time

## 2022-10-09 NOTE — ED Notes (Signed)
Patient transported to CT 

## 2022-10-09 NOTE — ED Notes (Signed)
Patient provided with 2 cups of water and food upon her request

## 2022-10-09 NOTE — ED Triage Notes (Signed)
Pt endorses body aches, cough, headaches for 4 days. Pt had covid shot last Friday.

## 2022-10-12 ENCOUNTER — Other Ambulatory Visit: Payer: Self-pay | Admitting: Cardiovascular Disease

## 2022-10-12 DIAGNOSIS — I4821 Permanent atrial fibrillation: Secondary | ICD-10-CM

## 2022-10-14 ENCOUNTER — Encounter: Payer: Self-pay | Admitting: Cardiovascular Disease

## 2022-10-14 ENCOUNTER — Ambulatory Visit: Payer: Medicare HMO | Attending: Cardiovascular Disease | Admitting: Cardiovascular Disease

## 2022-10-14 VITALS — BP 112/72 | HR 42 | Ht 62.0 in | Wt 190.0 lb

## 2022-10-14 DIAGNOSIS — I4891 Unspecified atrial fibrillation: Secondary | ICD-10-CM

## 2022-10-14 DIAGNOSIS — J069 Acute upper respiratory infection, unspecified: Secondary | ICD-10-CM | POA: Diagnosis not present

## 2022-10-14 DIAGNOSIS — Z7901 Long term (current) use of anticoagulants: Secondary | ICD-10-CM

## 2022-10-14 DIAGNOSIS — Z9889 Other specified postprocedural states: Secondary | ICD-10-CM

## 2022-10-14 NOTE — Patient Instructions (Addendum)
Medication Instructions:  Your physician recommends that you continue on your current medications as directed. Please refer to the Current Medication list given to you today.  *If you need a refill on your cardiac medications before your next appointment, please call your pharmacy*  Lab Work: If you have labs (blood work) drawn today and your tests are completely normal, you will receive your results only by: [place] (if you have MyChart) OR A paper copy in the mail If you have any lab test that is abnormal or we need to change your treatment, we will call you to review the results.  Testing/Procedures: None ordered today.  Follow-Up: At [facility], you and your health needs are our priority.  As part of our continuing mission to provide you with exceptional heart care, we have created designated Provider Care Teams.  These Care Teams include your primary Cardiologist (physician) and Advanced Practice Providers (APPs -  Physician Assistants and Nurse Practitioners) who all work together to provide you with the care you need, when you need it.  We recommend signing up for the patient portal called "MyChart".  Sign up information is provided on this After Visit Summary.  MyChart is used to connect with patients for Virtual Visits (Telemedicine).  Patients are able to view lab/test results, encounter notes, upcoming appointments, etc.  Non-urgent messages can be sent to your provider as well.   To learn more about what you can do with MyChart, go to [email or web address].    Your next appointment:   6 months  The format for your next appointment:   In Person  Provider:   Jenkins Rouge, MD      Important Information About Sugar

## 2022-10-22 ENCOUNTER — Ambulatory Visit: Payer: Medicare HMO

## 2022-10-25 ENCOUNTER — Encounter: Payer: Self-pay | Admitting: Internal Medicine

## 2022-10-25 ENCOUNTER — Ambulatory Visit (INDEPENDENT_AMBULATORY_CARE_PROVIDER_SITE_OTHER): Payer: Medicare HMO | Admitting: Internal Medicine

## 2022-10-25 VITALS — BP 124/72 | HR 60 | Temp 97.6°F | Ht 62.0 in | Wt 189.0 lb

## 2022-10-25 DIAGNOSIS — R051 Acute cough: Secondary | ICD-10-CM

## 2022-10-25 DIAGNOSIS — Z7901 Long term (current) use of anticoagulants: Secondary | ICD-10-CM | POA: Diagnosis not present

## 2022-10-25 DIAGNOSIS — J069 Acute upper respiratory infection, unspecified: Secondary | ICD-10-CM | POA: Diagnosis not present

## 2022-10-25 DIAGNOSIS — B079 Viral wart, unspecified: Secondary | ICD-10-CM | POA: Diagnosis not present

## 2022-10-25 DIAGNOSIS — I5032 Chronic diastolic (congestive) heart failure: Secondary | ICD-10-CM

## 2022-10-25 DIAGNOSIS — N1831 Chronic kidney disease, stage 3a: Secondary | ICD-10-CM | POA: Diagnosis not present

## 2022-10-25 DIAGNOSIS — R059 Cough, unspecified: Secondary | ICD-10-CM | POA: Insufficient documentation

## 2022-10-25 MED ORDER — CEFDINIR 300 MG PO CAPS: 300.0000 mg | ORAL_CAPSULE | Freq: Two times a day (BID) | ORAL | 0 refills | Status: DC

## 2022-10-25 MED ORDER — PROMETHAZINE-DM 6.25-15 MG/5ML PO SYRP: 5.0000 mL | ORAL_SOLUTION | Freq: Four times a day (QID) | ORAL | 0 refills | Status: DC | PRN

## 2022-10-25 NOTE — Assessment & Plan Note (Signed)
Lungs ar CTA

## 2022-10-25 NOTE — Assessment & Plan Note (Signed)
Hydrate well 

## 2022-10-25 NOTE — Patient Instructions (Addendum)
HOKA shoes   Postprocedure instructions :     Keep the wounds clean. You can wash them with liquid soap and water. Pat dry with gauze or a Kleenex tissue  Before applying antibiotic ointment and a Band-Aid.   You need to report immediately  if  any signs of infection develop.

## 2022-10-25 NOTE — Assessment & Plan Note (Signed)
New post-viral Omnicef, Prom cough syr

## 2022-10-25 NOTE — Assessment & Plan Note (Signed)
See Cryo 

## 2022-10-25 NOTE — Progress Notes (Signed)
Subjective:  Patient ID: Beth Miller, female    DOB: [birth year]  Age: 81 y.o. MRN: [id number]  CC: URI (Cough , congestion , sore on stomach when coughing , no appetite , weak)   HPI Beth Miller presents for URI f/u  2 wks ago C/o productive cough, weakness Pt went to ER and had a CXR on 12/13 - reviewed C/o wart on L cheek   Outpatient Medications Prior to Visit  Medication Sig Dispense Refill   Accu-Chek Softclix Lancets lancets 1 each by Other route 2 (two) times daily. Use to check blood sugars twice a day 200 each 3   acetaminophen (TYLENOL) 500 MG tablet Take 1,000 mg by mouth every 6 (six) hours as needed for mild pain.     Alcohol Swabs (B-D SINGLE USE SWABS REGULAR) PADS 1 each by Does not apply route 2 (two) times daily. Use to clean finger to check blood sugars twice a day 200 each 3   benzonatate (TESSALON) 100 MG capsule Take 1 capsule (100 mg total) by mouth every 8 (eight) hours. 21 capsule 0   Cholecalciferol (VITAMIN D3) 1000 UNIT tablet Take 1,000 Units by mouth daily.     colchicine 0.6 MG tablet Take two tablets prn gout attack.Then take another one in 1-2 hrs. Do not repeat for 3 days. 18 tablet 1   cyclobenzaprine (FLEXERIL) 10 MG tablet take 1/2 to 1 TABLET BY MOUTH THREE TIMES DAILY AS NEEDED FOR MUSCLE SPASMS 90 tablet 1   diltiazem (CARDIZEM CD) 240 MG 24 hr capsule Take 1 capsule (240 mg total) by mouth daily. 90 capsule 2   famotidine (PEPCID) 40 MG tablet Take 1 tablet (40 mg total) by mouth at bedtime. 90 tablet 3   furosemide (LASIX) 20 MG tablet Take 1 tablet (20 mg total) by mouth every morning. 90 tablet 3   furosemide (LASIX) 40 MG tablet Take 1 tablet (40 mg total) by mouth daily. 90 tablet 3   gabapentin (NEURONTIN) 100 MG capsule Take 1 capsule (100 mg total) by mouth at bedtime. 90 capsule 3   glucose blood (ACCU-CHEK AVIVA PLUS) test strip TEST BLOOD SUGAR TWICE DAILY 200 strip 3   Lancet Devices (ACCU-CHEK SOFTCLIX) lancets 1 each by  Other route 2 (two) times daily. Use as instructed 1 each 5   loratadine (ALLERGY RELIEF) 10 MG tablet TAKE ONE TABLET BY MOUTH EVERY DAY 30 tablet 11   memantine (NAMENDA) 5 MG tablet TAKE ONE TABLET BY MOUTH BY MOUTH TWICE DAILY 180 tablet 3   metoprolol succinate (TOPROL-XL) 50 MG 24 hr tablet Take 0.5 tablets (25 mg total) by mouth daily. 45 tablet 2   mirabegron ER (MYRBETRIQ) 25 MG TB24 tablet Take 1 tablet (25 mg total) by mouth daily. 90 tablet 3   pantoprazole (PROTONIX) 40 MG tablet TAKE ONE TABLET BY MOUTH BY MOUTH EVERY MORNING 90 tablet 3   potassium chloride (KLOR-CON M) 10 MEQ tablet Take 1 tablet (10 mEq total) by mouth 2 (two) times daily. 180 tablet 3   tolterodine (DETROL LA) 4 MG 24 hr capsule Take 1 capsule (4 mg total) by mouth daily. 30 capsule 11   vitamin B-12 (CYANOCOBALAMIN) 1000 MCG tablet Take 1 tablet (1,000 mcg total) by mouth daily. 90 tablet 3   warfarin (COUMADIN) 10 MG tablet TAKE 1 TABLET BY MOUTH DAILY OR AS DIRECTED BY COUMADIN CLINIC 60 tablet 3   warfarin (COUMADIN) 5 MG tablet TAKE 1 TABLET TWICE WEEKLY ALONG WITH  $'10MG'S$  DOSE OR AS ADVISED BY COUMADIN CLINIC 30 tablet 1   amoxicillin (AMOXIL) 500 MG tablet Take four capsules (2,000 mg) one hour prior to all dental visits. (Patient not taking: Reported on 10/14/2022) 8 tablet 11   No facility-administered medications prior to visit.    ROS: Review of Systems  Constitutional:  Negative for activity change, appetite change, chills, fatigue and unexpected weight change.  HENT:  Positive for congestion, sinus pressure and voice change. Negative for mouth sores.   Eyes:  Negative for visual disturbance.  Respiratory:  Positive for cough and wheezing. Negative for chest tightness.   Gastrointestinal:  Negative for abdominal pain and nausea.  Genitourinary:  Negative for difficulty urinating, frequency and vaginal pain.  Musculoskeletal:  Negative for back pain and gait problem.  Skin:  Negative for pallor and  rash.  Neurological:  Positive for weakness. Negative for dizziness, tremors, numbness and headaches.  Psychiatric/Behavioral:  Negative for confusion and sleep disturbance.     Objective:  BP 124/72 (BP Location: Left Arm, Patient Position: Sitting, Cuff Size: Normal)   Pulse 60   Temp 97.6 F (36.4 C) (Oral)   Ht '5\' 2"'$  (1.575 m)   Wt 189 lb (85.7 kg)   LMP  (LMP Unknown)   SpO2 99%   BMI 34.57 kg/m   BP Readings from Last 3 Encounters:  10/25/22 124/72  10/14/22 112/72  10/09/22 115/70    Wt Readings from Last 3 Encounters:  10/25/22 189 lb (85.7 kg)  10/14/22 190 lb (86.2 kg)  08/05/22 187 lb (84.8 kg)    Physical Exam Constitutional:      General: She is not in acute distress.    Appearance: She is well-developed. She is obese.  HENT:     Head: Normocephalic.     Right Ear: External ear normal.     Left Ear: External ear normal.     Nose: Nose normal.  Eyes:     General:        Right eye: No discharge.        Left eye: No discharge.     Conjunctiva/sclera: Conjunctivae normal.     Pupils: Pupils are equal, round, and reactive to light.  Neck:     Thyroid: No thyromegaly.     Vascular: No JVD.     Trachea: No tracheal deviation.  Cardiovascular:     Rate and Rhythm: Normal rate and regular rhythm.     Heart sounds: Normal heart sounds.  Pulmonary:     Effort: No respiratory distress.     Breath sounds: No stridor. No wheezing.  Abdominal:     General: Bowel sounds are normal. There is no distension.     Palpations: Abdomen is soft. There is no mass.     Tenderness: There is no abdominal tenderness. There is no guarding or rebound.  Musculoskeletal:        General: No tenderness.     Cervical back: Normal range of motion and neck supple. No rigidity.  Lymphadenopathy:     Cervical: No cervical adenopathy.  Skin:    Findings: No erythema or rash.  Neurological:     Cranial Nerves: No cranial nerve deficit.     Motor: No abnormal muscle tone.      Coordination: Coordination normal.     Gait: Gait abnormal.     Deep Tendon Reflexes: Reflexes normal.  Psychiatric:        Behavior: Behavior normal.  Thought Content: Thought content normal.        Judgment: Judgment normal.   One wart on L cheek   Procedure Note :     Procedure : Cryosurgery   Indication:  Wart(s)    Risks including unsuccessful procedure , bleeding, infection, bruising, scar, a need for a repeat  procedure and others were explained to the patient in detail as well as the benefits. Informed consent was obtained verbally.    1 lesion(s)  on  L cheek  was/were treated with liquid nitrogen on a Q-tip in a usual fasion . Band-Aid was applied and antibiotic ointment was given for a later use.   Tolerated well. Complications none.   Postprocedure instructions :     Keep the wounds clean. You can wash them with liquid soap and water. Pat dry with gauze or a Kleenex tissue  Before applying antibiotic ointment and a Band-Aid.   You need to report immediately  if  any signs of infection develop.    Lab Results  Component Value Date   WBC 10.6 (H) 10/09/2022   HGB 11.6 (L) 10/09/2022   HCT 33.5 (L) 10/09/2022   PLT 240 10/09/2022   GLUCOSE 153 (H) 10/09/2022   CHOL 116 01/22/2022   TRIG 92 01/22/2022   HDL 47 01/22/2022   LDLCALC 51 01/22/2022   ALT 10 02/26/2022   AST 16 02/26/2022   NA 137 10/09/2022   K 3.8 10/09/2022   CL 99 10/09/2022   CREATININE 1.25 (H) 10/09/2022   BUN 13 10/09/2022   CO2 26 10/09/2022   TSH 2.42 02/26/2022   INR 2.2 10/01/2022   HGBA1C 8.2 (H) 02/26/2022    CT Head Wo Contrast  Result Date: 10/09/2022 CLINICAL DATA:  Headache, adache. EXAM: CT HEAD WITHOUT CONTRAST TECHNIQUE: Contiguous axial images were obtained from the base of the skull through the vertex without intravenous contrast. RADIATION DOSE REDUCTION: This exam was performed according to the departmental dose-optimization program which includes automated  exposure control, adjustment of the mA and/or kV according to patient size and/or use of iterative reconstruction technique. COMPARISON:  Head CTA 09/10/2014 FINDINGS: Brain: There is no evidence of an acute infarct, intracranial hemorrhage, mass, midline shift, or extra-axial fluid collection. Mild cerebral atrophy has mildly progressed. Hypodensities in the cerebral white matter bilaterally have also progressed and are nonspecific but compatible with mild chronic small vessel ischemic disease. A small chronic cortical/subcortical infarct in the right occipital lobe is unchanged. Vascular: No hyperdense vessel. Skull: No fracture or suspicious osseous lesion. Sinuses/Orbits: Visualized paranasal sinuses and mastoid air cells are clear. Bilateral cataract extraction. Other: None. IMPRESSION: 1. No evidence of acute intracranial abnormality. 2. Mild chronic small vessel ischemic disease. Electronically Signed   By: Logan Bores M.D.   On: 10/09/2022 18:37   DG Chest 2 View  Result Date: 10/09/2022 CLINICAL DATA:  Dry cough for 10 days, productive cough for 4-5 days, chest pain from coughing, former smoker, diabetes mellitus, hypertension EXAM: CHEST - 2 VIEW COMPARISON:  02/14/2021 FINDINGS: Enlargement of cardiac silhouette post median sternotomy. Mediastinal contours and pulmonary vascularity normal. Atherosclerotic calcification aorta. Minimal bibasilar atelectasis and mild central chronic bronchitic changes. No definite infiltrate, pleural effusion, or pneumothorax. Bones demineralized IMPRESSION: Enlargement of cardiac silhouette. Chronic bronchitic changes and minimal bibasilar atelectasis. Aortic Atherosclerosis (ICD10-I70.0). Electronically Signed   By: Lavonia Dana M.D.   On: 10/09/2022 13:15    Assessment & Plan:   Problem List Items Addressed This Visit  Wart of face    See Cryo      Relevant Medications   cefdinir (OMNICEF) 300 MG capsule   Cough    New post-viral Omnicef, Prom cough  syr       Chronic kidney disease, stage 3a (HCC)    Hydrate well      Chronic diastolic CHF (congestive heart failure) (HCC)    Lungs ar CTA      Anticoagulant long-term use    On Coumadin      Acute upper respiratory infection - Primary    New post-viral Omnicef, Prom cough syr       Relevant Medications   cefdinir (OMNICEF) 300 MG capsule      Meds ordered this encounter  Medications   cefdinir (OMNICEF) 300 MG capsule    Sig: Take 1 capsule (300 mg total) by mouth 2 (two) times daily.    Dispense:  20 capsule    Refill:  0   promethazine-dextromethorphan (PROMETHAZINE-DM) 6.25-15 MG/5ML syrup    Sig: Take 5 mLs by mouth 4 (four) times daily as needed for cough.    Dispense:  240 mL    Refill:  0      Follow-up: Return in about 3 months (around 01/24/2023) for a follow-up visit.  Walker Kehr, MD

## 2022-10-25 NOTE — Assessment & Plan Note (Signed)
On Coumadin 

## 2022-11-04 ENCOUNTER — Ambulatory Visit: Payer: Medicare HMO | Attending: Cardiology

## 2022-11-04 DIAGNOSIS — Z7901 Long term (current) use of anticoagulants: Secondary | ICD-10-CM

## 2022-11-04 DIAGNOSIS — Z5181 Encounter for therapeutic drug level monitoring: Secondary | ICD-10-CM

## 2022-11-04 DIAGNOSIS — I4891 Unspecified atrial fibrillation: Secondary | ICD-10-CM

## 2022-11-04 DIAGNOSIS — Z9889 Other specified postprocedural states: Secondary | ICD-10-CM | POA: Diagnosis not present

## 2022-11-04 LAB — POCT INR: INR: 3.8 — AB (ref 2.0–3.0)

## 2022-11-04 NOTE — Patient Instructions (Signed)
CONTINUE taking Warfarin '10mg'$  (white tablet) daily.  Recheck INR in 4 weeks. Stay consistent with greens 1-2 servings a week of greens.  [facility] [phone]

## 2022-11-05 ENCOUNTER — Ambulatory Visit: Payer: Medicare HMO | Admitting: Internal Medicine

## 2022-11-14 DIAGNOSIS — H524 Presbyopia: Secondary | ICD-10-CM | POA: Diagnosis not present

## 2022-11-14 DIAGNOSIS — Z961 Presence of intraocular lens: Secondary | ICD-10-CM | POA: Diagnosis not present

## 2022-11-14 DIAGNOSIS — H43812 Vitreous degeneration, left eye: Secondary | ICD-10-CM | POA: Diagnosis not present

## 2022-11-14 DIAGNOSIS — E119 Type 2 diabetes mellitus without complications: Secondary | ICD-10-CM | POA: Diagnosis not present

## 2022-11-14 LAB — HM DIABETES EYE EXAM

## 2022-12-02 ENCOUNTER — Ambulatory Visit: Payer: Medicare HMO | Attending: Cardiology

## 2022-12-02 DIAGNOSIS — Z7901 Long term (current) use of anticoagulants: Secondary | ICD-10-CM | POA: Diagnosis not present

## 2022-12-02 DIAGNOSIS — I4891 Unspecified atrial fibrillation: Secondary | ICD-10-CM

## 2022-12-02 DIAGNOSIS — Z5181 Encounter for therapeutic drug level monitoring: Secondary | ICD-10-CM | POA: Diagnosis not present

## 2022-12-02 DIAGNOSIS — Z9889 Other specified postprocedural states: Secondary | ICD-10-CM

## 2022-12-02 LAB — POCT INR: INR: 2.4 (ref 2.0–3.0)

## 2022-12-02 NOTE — Patient Instructions (Signed)
TAKE 1.5 TABLETS TODAY ONLY CONTINUE taking Warfarin '10mg'$  (white tablet) daily.  Recheck INR in 4 weeks. Stay consistent with greens 1-2 servings a week of greens.  [facility] [phone]

## 2022-12-04 ENCOUNTER — Ambulatory Visit (INDEPENDENT_AMBULATORY_CARE_PROVIDER_SITE_OTHER): Payer: Medicare HMO | Admitting: Internal Medicine

## 2022-12-04 VITALS — BP 122/76 | HR 96 | Temp 98.9°F | Ht 62.0 in | Wt 194.0 lb

## 2022-12-04 DIAGNOSIS — R062 Wheezing: Secondary | ICD-10-CM | POA: Diagnosis not present

## 2022-12-04 DIAGNOSIS — I130 Hypertensive heart and chronic kidney disease with heart failure and stage 1 through stage 4 chronic kidney disease, or unspecified chronic kidney disease: Secondary | ICD-10-CM | POA: Diagnosis not present

## 2022-12-04 DIAGNOSIS — J029 Acute pharyngitis, unspecified: Secondary | ICD-10-CM

## 2022-12-04 DIAGNOSIS — I10 Essential (primary) hypertension: Secondary | ICD-10-CM

## 2022-12-04 DIAGNOSIS — N1831 Chronic kidney disease, stage 3a: Secondary | ICD-10-CM

## 2022-12-04 DIAGNOSIS — R051 Acute cough: Secondary | ICD-10-CM | POA: Diagnosis not present

## 2022-12-04 DIAGNOSIS — E1122 Type 2 diabetes mellitus with diabetic chronic kidney disease: Secondary | ICD-10-CM | POA: Diagnosis not present

## 2022-12-04 DIAGNOSIS — I509 Heart failure, unspecified: Secondary | ICD-10-CM | POA: Diagnosis not present

## 2022-12-04 LAB — POC SOFIA SARS ANTIGEN FIA: SARS Coronavirus 2 Ag: NEGATIVE

## 2022-12-04 LAB — POCT RESPIRATORY SYNCYTIAL VIRUS: RSV Rapid Ag: NEGATIVE

## 2022-12-04 LAB — POC INFLUENZA A&B (BINAX/QUICKVUE)
Influenza A, POC: NEGATIVE
Influenza B, POC: NEGATIVE

## 2022-12-04 MED ORDER — PREDNISONE 10 MG PO TABS: ORAL_TABLET | ORAL | 0 refills | Status: DC

## 2022-12-04 MED ORDER — AZITHROMYCIN 250 MG PO TABS: ORAL_TABLET | ORAL | 1 refills | Status: AC

## 2022-12-04 MED ORDER — GUAIFENESIN-DM 100-10 MG/5ML PO SYRP: 5.0000 mL | ORAL_SOLUTION | ORAL | 0 refills | Status: DC | PRN

## 2022-12-04 NOTE — Patient Instructions (Signed)
Your COVID, Flu, and RSV testing is negative  Please take all new medication as prescribed - the antibiotic, cough medicine, and prednisone  Please continue all other medications as before, and refills have been done if requested.  Please have the pharmacy call with any other refills you may need.  Please keep your appointments with your specialists as you may have planned

## 2022-12-04 NOTE — Progress Notes (Signed)
Patient ID: Beth Miller, female   DOB: 05-19-1941, 82 y.o.   MRN: [id number]        Chief Complaint: follow up cough, wheezing, dm, htn       HPI:  Beth Miller is a 82 y.o. female here with 1 mo onset  ST, HA, general weakness and malaise, with prod cough now greenish sputum, wheezing, sob, but Pt denies chest pain, orthopnea, PND, increased LE swelling, palpitations, dizziness or syncope.   Pt denies polydipsia, polyuria, or new focal neuro s/s.    Pt denies fever, wt loss, night sweats, loss of appetite, or other constitutional symptoms        Wt Readings from Last 3 Encounters:  12/04/22 194 lb (88 kg)  10/25/22 189 lb (85.7 kg)  10/14/22 190 lb (86.2 kg)   BP Readings from Last 3 Encounters:  12/04/22 122/76  10/25/22 124/72  10/14/22 112/72         Past Medical History:  Diagnosis Date   AF (atrial fibrillation) ([place])    ALLERGIC RHINITIS    Blood transfusion without reported diagnosis    Cataract, senile    Chest pain    CHF (congestive heart failure) (HCC)    Chronic anticoagulation    Diabetes mellitus    Diabetes mellitus    Disorder of oral soft tissue    of mouth   Diverticulosis    GERD (gastroesophageal reflux disease)    Hematoma    Hypertension    Leg pain    Low back pain    Paresthesia    Rheumatic fever    Status post mitral valve replacement    [employer] valve   Stroke ([employer])    tia   Sweating    Vitamin D deficiency    Past Surgical History:  Procedure Laterality Date   ABDOMINAL HYSTERECTOMY     CARDIAC VALVE REPLACEMENT  1995   Mitral valve prosthesis; st jude   CHOLECYSTECTOMY      reports that she quit smoking about 20 years ago. Her smoking use included cigarettes. She has a 1.20 pack-year smoking history. She has never been exposed to tobacco smoke. She has never used smokeless tobacco. She reports that she does not drink alcohol and does not use drugs. family history includes Diabetes in her brother and mother; Heart disease in  her brother and brother; Other in her mother; Pancreatic cancer in her brother; Seizures in her mother. Allergies  Allergen Reactions   Aricept [Donepezil Hcl]     Intolerance - bad dreams   Food     Bananas and Fish, cause sinus drainage   Furosemide     cramps   Metformin And Related Diarrhea   Quinine Other (See Comments)    Can't hear   Spironolactone     constipation   Tramadol     headache   Sulfadiazine Itching and Rash   Current Outpatient Medications on File Prior to Visit  Medication Sig Dispense Refill   Accu-Chek Softclix Lancets lancets 1 each by Other route 2 (two) times daily. Use to check blood sugars twice a day 200 each 3   acetaminophen (TYLENOL) 500 MG tablet Take 1,000 mg by mouth every 6 (six) hours as needed for mild pain.     Alcohol Swabs (B-D SINGLE USE SWABS REGULAR) PADS 1 each by Does not apply route 2 (two) times daily. Use to clean finger to check blood sugars twice a day 200 each 3   benzonatate (  TESSALON) 100 MG capsule Take 1 capsule (100 mg total) by mouth every 8 (eight) hours. 21 capsule 0   Cholecalciferol (VITAMIN D3) 1000 UNIT tablet Take 1,000 Units by mouth daily.     colchicine 0.6 MG tablet Take two tablets prn gout attack.Then take another one in 1-2 hrs. Do not repeat for 3 days. 18 tablet 1   cyclobenzaprine (FLEXERIL) 10 MG tablet take 1/2 to 1 TABLET BY MOUTH THREE TIMES DAILY AS NEEDED FOR MUSCLE SPASMS 90 tablet 1   diltiazem (CARDIZEM CD) 240 MG 24 hr capsule Take 1 capsule (240 mg total) by mouth daily. 90 capsule 2   famotidine (PEPCID) 40 MG tablet Take 1 tablet (40 mg total) by mouth at bedtime. 90 tablet 3   furosemide (LASIX) 20 MG tablet Take 1 tablet (20 mg total) by mouth every morning. 90 tablet 3   furosemide (LASIX) 40 MG tablet Take 1 tablet (40 mg total) by mouth daily. 90 tablet 3   gabapentin (NEURONTIN) 100 MG capsule Take 1 capsule (100 mg total) by mouth at bedtime. 90 capsule 3   glucose blood (ACCU-CHEK AVIVA  PLUS) test strip TEST BLOOD SUGAR TWICE DAILY 200 strip 3   Lancet Devices (ACCU-CHEK SOFTCLIX) lancets 1 each by Other route 2 (two) times daily. Use as instructed 1 each 5   loratadine (ALLERGY RELIEF) 10 MG tablet TAKE ONE TABLET BY MOUTH EVERY DAY 30 tablet 11   memantine (NAMENDA) 5 MG tablet TAKE ONE TABLET BY MOUTH BY MOUTH TWICE DAILY 180 tablet 3   metoprolol succinate (TOPROL-XL) 50 MG 24 hr tablet Take 0.5 tablets (25 mg total) by mouth daily. 45 tablet 2   mirabegron ER (MYRBETRIQ) 25 MG TB24 tablet Take 1 tablet (25 mg total) by mouth daily. 90 tablet 3   pantoprazole (PROTONIX) 40 MG tablet TAKE ONE TABLET BY MOUTH BY MOUTH EVERY MORNING 90 tablet 3   potassium chloride (KLOR-CON M) 10 MEQ tablet Take 1 tablet (10 mEq total) by mouth 2 (two) times daily. 180 tablet 3   promethazine-dextromethorphan (PROMETHAZINE-DM) 6.25-15 MG/5ML syrup Take 5 mLs by mouth 4 (four) times daily as needed for cough. 240 mL 0   tolterodine (DETROL LA) 4 MG 24 hr capsule Take 1 capsule (4 mg total) by mouth daily. 30 capsule 11   vitamin B-12 (CYANOCOBALAMIN) 1000 MCG tablet Take 1 tablet (1,000 mcg total) by mouth daily. 90 tablet 3   warfarin (COUMADIN) 10 MG tablet TAKE 1 TABLET BY MOUTH DAILY OR AS DIRECTED BY COUMADIN CLINIC 60 tablet 3   warfarin (COUMADIN) 5 MG tablet TAKE 1 TABLET TWICE WEEKLY ALONG WITH 10MG DOSE OR AS ADVISED BY COUMADIN CLINIC 30 tablet 1   No current facility-administered medications on file prior to visit.        ROS:  All others reviewed and negative.  Objective        PE:  BP 122/76 (BP Location: Left Arm, Patient Position: Sitting, Cuff Size: Large)   Pulse 96   Temp 98.9 F (37.2 C) (Oral)   Ht 5' 2"$  (1.575 m)   Wt 194 lb (88 kg)   LMP  (LMP Unknown)   SpO2 93%   BMI 35.48 kg/m                 Constitutional: Pt appears in NAD, mild ill               HENT: Head: NCAT.  Right Ear: External ear normal.                 Left Ear: External ear  normal. Bilat tm's with mild erythema.  Max sinus areas non tender.  Pharynx with mild erythema, no exudate                 Eyes: . Pupils are equal, round, and reactive to light. Conjunctivae and EOM are normal               Nose: without d/c or deformity               Neck: Neck supple. Gross normal ROM               Cardiovascular: Normal rate and regular rhythm.                 Pulmonary/Chest: Effort normal and breath sounds decreased without rales but with mild wheezing bilateral                               Neurological: Pt is alert. At baseline orientation, motor grossly intact               Skin: Skin is warm. No rashes, no other new lesions, LE edema - none               Psychiatric: Pt behavior is normal without agitation   Micro: none  Cardiac tracings I have personally interpreted today:  none  Pertinent Radiological findings (summarize): none   Lab Results  Component Value Date   WBC 10.6 (H) 10/09/2022   HGB 11.6 (L) 10/09/2022   HCT 33.5 (L) 10/09/2022   PLT 240 10/09/2022   GLUCOSE 153 (H) 10/09/2022   CHOL 116 01/22/2022   TRIG 92 01/22/2022   HDL 47 01/22/2022   LDLCALC 51 01/22/2022   ALT 10 02/26/2022   AST 16 02/26/2022   NA 137 10/09/2022   K 3.8 10/09/2022   CL 99 10/09/2022   CREATININE 1.25 (H) 10/09/2022   BUN 13 10/09/2022   CO2 26 10/09/2022   TSH 2.42 02/26/2022   INR 2.4 12/02/2022   HGBA1C 8.2 (H) 02/26/2022   POCT - COVID - neg, RSV - neg, Flu A/B - neg  Assessment/Plan:  Beth Miller is a 82 y.o. Black or African American [2] female with  has a past medical history of AF (atrial fibrillation) ([place]), ALLERGIC RHINITIS, Blood transfusion without reported diagnosis, Cataract, senile, Chest pain, CHF (congestive heart failure) ([place]), Chronic anticoagulation, Diabetes mellitus, Diabetes mellitus, Disorder of oral soft tissue, Diverticulosis, GERD (gastroesophageal reflux disease), Hematoma, Hypertension, Leg pain, Low back pain, Paresthesia,  Rheumatic fever, Status post mitral valve replacement, Stroke ([place]), Sweating, and Vitamin D deficiency.  Wheezing Mild to mod, for prednisone taper,  to f/u any worsening symptoms or concerns  Essential hypertension BP Readings from Last 3 Encounters:  12/04/22 122/76  10/25/22 124/72  10/14/22 112/72   Stable, pt to continue medical treatment toprol 25 qd, card CD - 240 qd   DM2 (diabetes mellitus, type 2) ([place]) Lab Results  Component Value Date   HGBA1C 8.2 (H) 02/26/2022   Mild uncontrolled,goal < 7.5,, pt declines OHA tx for now, cont monitor home CBGs  Cough Mild to mod, for antibx course zpack, robitussin DM prn,  to f/u any worsening symptoms or concerns, declines cxr   Followup: Return if symptoms worsen  or fail to improve.  Cathlean Cower, MD 12/07/2022 1:29 PM [facility]

## 2022-12-07 ENCOUNTER — Encounter: Payer: Self-pay | Admitting: Internal Medicine

## 2022-12-07 DIAGNOSIS — R062 Wheezing: Secondary | ICD-10-CM | POA: Insufficient documentation

## 2022-12-07 NOTE — Assessment & Plan Note (Signed)
Lab Results  Component Value Date   HGBA1C 8.2 (H) 02/26/2022   Mild uncontrolled,goal < 7.5,, pt declines OHA tx for now, cont monitor home CBGs

## 2022-12-07 NOTE — Assessment & Plan Note (Addendum)
Mild to mod, for antibx course zpack, robitussin DM prn,  to f/u any worsening symptoms or concerns, declines cxr

## 2022-12-07 NOTE — Assessment & Plan Note (Signed)
BP Readings from Last 3 Encounters:  12/04/22 122/76  10/25/22 124/72  10/14/22 112/72   Stable, pt to continue medical treatment toprol 25 qd, card CD - 240 qd

## 2022-12-07 NOTE — Assessment & Plan Note (Signed)
Mild to mod, for prednisone taper,  to f/u any worsening symptoms or concerns

## 2022-12-19 ENCOUNTER — Telehealth: Payer: Self-pay | Admitting: Internal Medicine

## 2022-12-19 ENCOUNTER — Ambulatory Visit (INDEPENDENT_AMBULATORY_CARE_PROVIDER_SITE_OTHER): Payer: Medicare HMO

## 2022-12-19 VITALS — Ht 62.0 in | Wt 194.0 lb

## 2022-12-19 DIAGNOSIS — E119 Type 2 diabetes mellitus without complications: Secondary | ICD-10-CM | POA: Diagnosis not present

## 2022-12-19 DIAGNOSIS — Z0000 Encounter for general adult medical examination without abnormal findings: Secondary | ICD-10-CM | POA: Diagnosis not present

## 2022-12-19 NOTE — Progress Notes (Addendum)
I connected with  Beth Miller on 12/19/2022 at 1:00 p.m. EST by telephone and verified that I am speaking with the correct person using two identifiers.  Location: Patient: Home Provider: [place] Persons participating in the virtual visit: [place]   I discussed the limitations, risks, security and privacy concerns of performing an evaluation and management service by telephone and the availability of in person appointments. The patient expressed understanding and agreed to proceed.  Interactive audio and video telecommunications were attempted between this nurse and patient, however failed, due to patient having technical difficulties OR patient did not have access to video capability.  We continued and completed visit with audio only.  Some vital signs may be absent or patient reported.   Sheral Flow, LPN  Subjective:   Beth Miller is a 82 y.o. female who presents for Medicare Annual (Subsequent) preventive examination.  Review of Systems     Cardiac Risk Factors include: advanced age (>58mn, >>55women);diabetes mellitus;hypertension;family history of premature cardiovascular disease;sedentary lifestyle;obesity (BMI >30kg/m2)     Objective:    Today's Vitals   12/19/22 1302  Weight: 194 lb (88 kg)  Height: '5\' 2"'$  (1.575 m)  PainSc: 0-No pain   Body mass index is 35.48 kg/m.     12/19/2022    1:04 PM 10/09/2022    5:00 PM 12/18/2021   11:16 AM 02/06/2021   12:55 AM 06/18/2019   12:12 PM 02/26/2016    1:38 PM 09/09/2014    9:45 PM  Advanced Directives  Does Patient Have a Medical Advance Directive? No No No No No No No  Would patient like information on creating a medical advance directive? No - Patient declined  No - Patient declined No - Patient declined   No - patient declined information    Current Medications (verified) Outpatient Encounter Medications as of 12/19/2022  Medication Sig   Accu-Chek Softclix Lancets lancets 1  each by Other route 2 (two) times daily. Use to check blood sugars twice a day   acetaminophen (TYLENOL) 500 MG tablet Take 1,000 mg by mouth every 6 (six) hours as needed for mild pain.   Alcohol Swabs (B-D SINGLE USE SWABS REGULAR) PADS 1 each by Does not apply route 2 (two) times daily. Use to clean finger to check blood sugars twice a day   benzonatate (TESSALON) 100 MG capsule Take 1 capsule (100 mg total) by mouth every 8 (eight) hours.   Cholecalciferol (VITAMIN D3) 1000 UNIT tablet Take 1,000 Units by mouth daily.   colchicine 0.6 MG tablet Take two tablets prn gout attack.Then take another one in 1-2 hrs. Do not repeat for 3 days.   cyclobenzaprine (FLEXERIL) 10 MG tablet take 1/2 to 1 TABLET BY MOUTH THREE TIMES DAILY AS NEEDED FOR MUSCLE SPASMS   diltiazem (CARDIZEM CD) 240 MG 24 hr capsule Take 1 capsule (240 mg total) by mouth daily.   famotidine (PEPCID) 40 MG tablet Take 1 tablet (40 mg total) by mouth at bedtime.   furosemide (LASIX) 20 MG tablet Take 1 tablet (20 mg total) by mouth every morning.   furosemide (LASIX) 40 MG tablet Take 1 tablet (40 mg total) by mouth daily.   gabapentin (NEURONTIN) 100 MG capsule Take 1 capsule (100 mg total) by mouth at bedtime.   glucose blood (ACCU-CHEK AVIVA PLUS) test strip TEST BLOOD SUGAR TWICE DAILY   guaiFENesin-dextromethorphan (ROBITUSSIN DM) 100-10 MG/5ML syrup Take 5 mLs by mouth every 4 (four) hours as needed for  cough.   Lancet Devices (ACCU-CHEK SOFTCLIX) lancets 1 each by Other route 2 (two) times daily. Use as instructed   loratadine (ALLERGY RELIEF) 10 MG tablet TAKE ONE TABLET BY MOUTH EVERY DAY   memantine (NAMENDA) 5 MG tablet TAKE ONE TABLET BY MOUTH BY MOUTH TWICE DAILY   metoprolol succinate (TOPROL-XL) 50 MG 24 hr tablet Take 0.5 tablets (25 mg total) by mouth daily.   mirabegron ER (MYRBETRIQ) 25 MG TB24 tablet Take 1 tablet (25 mg total) by mouth daily.   pantoprazole (PROTONIX) 40 MG tablet TAKE ONE TABLET BY MOUTH BY  MOUTH EVERY MORNING   potassium chloride (KLOR-CON M) 10 MEQ tablet Take 1 tablet (10 mEq total) by mouth 2 (two) times daily.   predniSONE (DELTASONE) 10 MG tablet 2 tabs by mouth per day for 5 days   promethazine-dextromethorphan (PROMETHAZINE-DM) 6.25-15 MG/5ML syrup Take 5 mLs by mouth 4 (four) times daily as needed for cough.   tolterodine (DETROL LA) 4 MG 24 hr capsule Take 1 capsule (4 mg total) by mouth daily.   vitamin B-12 (CYANOCOBALAMIN) 1000 MCG tablet Take 1 tablet (1,000 mcg total) by mouth daily.   warfarin (COUMADIN) 10 MG tablet TAKE 1 TABLET BY MOUTH DAILY OR AS DIRECTED BY COUMADIN CLINIC   warfarin (COUMADIN) 5 MG tablet TAKE 1 TABLET TWICE WEEKLY ALONG WITH '10MG'$  DOSE OR AS ADVISED BY COUMADIN CLINIC   No facility-administered encounter medications on file as of 12/19/2022.    Allergies (verified) Aricept [donepezil hcl], Food, Furosemide, Metformin and related, Quinine, Spironolactone, Tramadol, and Sulfadiazine   History: Past Medical History:  Diagnosis Date   AF (atrial fibrillation) ([place])    ALLERGIC RHINITIS    Blood transfusion without reported diagnosis    Cataract, senile    Chest pain    CHF (congestive heart failure) ([facility])    Chronic anticoagulation    Diabetes mellitus    Diabetes mellitus    Disorder of oral soft tissue    of mouth   Diverticulosis    GERD (gastroesophageal reflux disease)    Hematoma    Hypertension    Leg pain    Low back pain    Paresthesia    Rheumatic fever    Status post mitral valve replacement    St. Jude valve   Stroke ([place])    tia   Sweating    Vitamin D deficiency    Past Surgical History:  Procedure Laterality Date   ABDOMINAL HYSTERECTOMY     CARDIAC VALVE REPLACEMENT  1995   Mitral valve prosthesis; st jude   CHOLECYSTECTOMY     Family History  Problem Relation Age of Onset   Diabetes Mother    Seizures Mother    Other Mother        brain tumor   Heart disease Brother    Diabetes Brother     Heart disease Brother    Pancreatic cancer Brother    Colon cancer Neg Hx    Social History   Socioeconomic History   Marital status: Married    Spouse name: Not on file   Number of children: 1   Years of education: Not on file   Highest education level: Not on file  Occupational History   Occupation: Retired    Fish farm manager: RETIRED  Tobacco Use   Smoking status: Former    Packs/day: 0.02    Years: 60.00    Total pack years: 1.20    Types: Cigarettes    Quit date:  10/28/2002    Years since quitting: 20.1    Passive exposure: Never   Smokeless tobacco: Never   Tobacco comments:    quit x 13 years   Vaping Use   Vaping Use: Never used  Substance and Sexual Activity   Alcohol use: No    Alcohol/week: 0.0 standard drinks of alcohol   Drug use: No   Sexual activity: Yes    Partners: Male  Other Topics Concern   Not on file  Social History Narrative   Negative Family History of Colon Cancer      Regular exercise-yes, bowling      Daily caffeine Use-rare   Social Determinants of Health   Financial Resource Strain: Low Risk  (12/19/2022)   Overall Financial Resource Strain (CARDIA)    Difficulty of Paying Living Expenses: Not hard at all  Food Insecurity: No Food Insecurity (12/19/2022)   Hunger Vital Sign    Worried About Running Out of Food in the Last Year: Never true    Ran Out of Food in the Last Year: Never true  Transportation Needs: No Transportation Needs (12/19/2022)   PRAPARE - Hydrologist (Medical): No    Lack of Transportation (Non-Medical): No  Physical Activity: Inactive (12/19/2022)   Exercise Vital Sign    Days of Exercise per Week: 0 days    Minutes of Exercise per Session: 0 min  Stress: No Stress Concern Present (12/19/2022)   Agency    Feeling of Stress : Not at all  Social Connections: [place] (12/19/2022)   Social Connection and Isolation  Panel [NHANES]    Frequency of Communication with Friends and Family: More than three times a week    Frequency of Social Gatherings with Friends and Family: More than three times a week    Attends Religious Services: More than 4 times per year    Active Member of [employer] or Organizations: Yes    Attends Music therapist: More than 4 times per year    Marital Status: Married    Tobacco Counseling Counseling given: Not Answered Tobacco comments: quit x 13 years    Clinical Intake:  Pre-visit preparation completed: Yes  Pain : No/denies pain Pain Score: 0-No pain     BMI - recorded: 35.48 Nutritional Status: BMI > 30  Obese Nutritional Risks: None Diabetes: No  How often do you need to have someone help you when you read instructions, pamphlets, or other written materials from your doctor or pharmacy?: 1 - Never What is the last grade level you completed in school?: HSG  Nutrition Risk Assessment:  Has the patient had any N/V/D within the last 2 months?  No  Does the patient have any non-healing wounds?  No  Has the patient had any unintentional weight loss or weight gain?  Yes   Diabetes:  Is the patient diabetic?  Yes  If diabetic, was a CBG obtained today?  No  Did the patient bring in their glucometer from home?  No  How often do you monitor your CBG's? 2 times daily.   Financial Strains and Diabetes Management:  Are you having any financial strains with the device, your supplies or your medication? No .  Does the patient want to be seen by Chronic Care Management for management of their diabetes?  Yes  Would the patient like to be referred to a Nutritionist or for Diabetic Management?  Yes  Diabetic Exams:  Diabetic Eye Exam: Overdue for diabetic eye exam. Pt has been advised about the importance in completing this exam. Patient advised to call and schedule an eye exam. Diabetic Foot Exam: Overdue, Pt has been advised about the importance in  completing this exam. Pt is scheduled for diabetic foot exam on 01/02/2023.   Interpreter Needed?: No  Information entered by :: Lisette Abu, LPN.   Activities of Daily Living    12/19/2022    1:06 PM  In your present state of health, do you have any difficulty performing the following activities:  Hearing? 1  Vision? 0  Difficulty concentrating or making decisions? 0  Walking or climbing stairs? 0  Dressing or bathing? 0  Doing errands, shopping? 0  Preparing Food and eating ? N  Using the Toilet? N  In the past six months, have you accidently leaked urine? Y  Do you have problems with loss of bowel control? N  Managing your Medications? N  Managing your Finances? N  Housekeeping or managing your Housekeeping? N    Patient Care Team: Plotnikov, Evie Lacks, MD as PCP - General Josue Hector, MD as PCP - Cardiology (Cardiology) Josue Hector, MD (Cardiology) Frederico Hamman, MD (Obstetrics and Gynecology) Nche, Charlene Brooke, NP as Nurse Practitioner (Internal Medicine) Thornton Park, MD as Consulting Physician (Gastroenterology) Szabat, Darnelle Maffucci, [facility] (Inactive) as Pharmacist (Pharmacist) Calvert Cantor, MD as Attending Physician (Ophthalmology)  Indicate any recent Medical Services you may have received from other than [facility] providers in the past year (date may be approximate).     Assessment:   This is a routine wellness examination for Rhiley.  Hearing/Vision screen Hearing Screening - Comments:: Left ear hearing difficulty. Hearings aids. Vision Screening - Comments:: Wears rx glasses - up to date with routine eye exams with [facility]   Dietary issues and exercise activities discussed: Current Exercise Habits: The patient does not participate in regular exercise at present, Exercise limited by: respiratory conditions(s)   Goals Addressed             This Visit's Progress    Client will verbalize knowledge of diabetes self-management  as evidenced by Hgb A1C <7 or as defined by provider.            Depression Screen    12/19/2022    1:20 PM 10/25/2022   10:36 AM 12/18/2021   11:17 AM 08/03/2020    3:42 PM 06/16/2019    9:24 AM 02/26/2016    1:38 PM 08/18/2015   12:30 PM  PHQ 2/9 Scores  PHQ - 2 Score 0 0 0 0 0 0 0  PHQ- 9 Score 6 6         Fall Risk    12/19/2022    1:04 PM 10/25/2022   10:36 AM 12/18/2021   11:18 AM 08/03/2020    3:43 PM 06/16/2019    9:23 AM  Worth in the past year? 0 0 0 0 0  Number falls in past yr: 0 0 0 0 0  Injury with Fall? 0 0 0 0 0  Risk for fall due to : No Fall Risks No Fall Risks No Fall Risks    Follow up Falls prevention discussed Falls evaluation completed Falls evaluation completed      FALL RISK PREVENTION PERTAINING TO THE HOME:  Any stairs in or around the home? No  If so, are there any without handrails? No  Home free  of loose throw rugs in walkways, pet beds, electrical cords, etc? Yes  Adequate lighting in your home to reduce risk of falls? Yes   ASSISTIVE DEVICES UTILIZED TO PREVENT FALLS:  Life alert? No  Use of a cane, walker or w/c? No  Grab bars in the bathroom? Yes  Shower chair or bench in shower? No  Elevated toilet seat or a handicapped toilet? Yes   TIMED UP AND GO:  Was the test performed? No . Telephonic Visit   Cognitive Function:        12/19/2022    1:06 PM  6CIT Screen  What Year? 0 points  What month? 0 points  What time? 0 points  Count back from 20 0 points  Months in reverse 0 points  Repeat phrase 0 points  Total Score 0 points    Immunizations Immunization History  Administered Date(s) Administered   Fluad Quad(high Dose 65+) 06/16/2019, 08/03/2020, 09/06/2020, 08/11/2021, 08/05/2022   H1N1 10/03/2008   Influenza Split 09/12/2011, 09/28/2012   Influenza Whole 10/04/2004, 10/14/2007, 09/04/2010   Influenza, High Dose Seasonal PF 08/07/2016, 09/11/2017, 07/31/2018   Influenza,inj,Quad PF,6+ Mos 07/20/2013,  11/11/2014, 07/19/2015   PFIZER(Purple Top)SARS-COV-2 Vaccination 12/21/2019, 01/11/2020, 08/08/2020, 03/28/2021   Pneumococcal Conjugate-13 04/28/2014   Pneumococcal Polysaccharide-23 10/03/2006   Tdap 05/08/2016    TDAP status: Up to date  Flu Vaccine status: Up to date  Pneumococcal vaccine status: Up to date  Covid-19 vaccine status: Completed vaccines  Qualifies for Shingles Vaccine? Yes   Zostavax completed No   Shingrix Completed?: No.    Education has been provided regarding the importance of this vaccine. Patient has been advised to call insurance company to determine out of pocket expense if they have not yet received this vaccine. Advised may also receive vaccine at local pharmacy or Health Dept. Verbalized acceptance and understanding.  Screening Tests Health Maintenance  Topic Date Due   FOOT EXAM  Never done   Diabetic kidney evaluation - Urine ACR  Never done   Zoster Vaccines- Shingrix (1 of 2) Never done   DEXA SCAN  Never done   OPHTHALMOLOGY EXAM  08/01/2021   HEMOGLOBIN A1C  08/29/2022   Diabetic kidney evaluation - eGFR measurement  10/10/2023   Medicare Annual Wellness (AWV)  12/20/2023   DTaP/Tdap/Td (2 - Td or Tdap) 05/08/2026   Pneumonia Vaccine 66+ Years old  Completed   INFLUENZA VACCINE  Completed   HPV VACCINES  Aged Out   COVID-19 Vaccine  Discontinued    Health Maintenance  Health Maintenance Due  Topic Date Due   FOOT EXAM  Never done   Diabetic kidney evaluation - Urine ACR  Never done   Zoster Vaccines- Shingrix (1 of 2) Never done   DEXA SCAN  Never done   OPHTHALMOLOGY EXAM  08/01/2021   HEMOGLOBIN A1C  08/29/2022    Colorectal cancer screening: No longer required.   Mammogram status: No longer required due to age.  Bone Density status: Never done  Lung Cancer Screening: (Low Dose CT Chest recommended if Age 60-80 years, 30 pack-year currently smoking OR have quit w/in 15years.) does not qualify.   Lung Cancer Screening  Referral: no  Additional Screening:  Hepatitis C Screening: does not qualify; Completed no  Vision Screening: Recommended annual ophthalmology exams for early detection of glaucoma and other disorders of the eye. Is the patient up to date with their annual eye exam?  No  Who is the provider or what is the name of the  office in which the patient attends annual eye exams? [facility] If pt is not established with a provider, would they like to be referred to a provider to establish care? Yes .   Dental Screening: Recommended annual dental exams for proper oral hygiene  Community Resource Referral / Chronic Care Management: CRR required this visit?  Yes   CCM required this visit?  Yes      Plan:     I have personally reviewed and noted the following in the patient's chart:   Medical and social history Use of alcohol, tobacco or illicit drugs  Current medications and supplements including opioid prescriptions. Patient is not currently taking opioid prescriptions. Functional ability and status Nutritional status Physical activity Advanced directives List of other physicians Hospitalizations, surgeries, and ER visits in previous 12 months Vitals Screenings to include cognitive, depression, and falls Referrals and appointments  In addition, I have reviewed and discussed with patient certain preventive protocols, quality metrics, and best practice recommendations. A written personalized care plan for preventive services as well as general preventive health recommendations were provided to patient.     Sheral Flow, LPN   [id number]   Nurse Notes: Referral to Diabetic Education and Endocrinologist for management of uncontrolled diabetes.    Medical screening examination/treatment/procedure(s) were performed by non-physician practitioner and as supervising physician I was immediately available for consultation/collaboration.  I agree with above. Lew Dawes,  MD

## 2022-12-19 NOTE — Patient Instructions (Addendum)
[name] , Thank you for taking time to come for your Medicare Wellness Visit. I appreciate your ongoing commitment to your health goals. Please review the following plan we discussed and let me know if I can assist you in the future.   These are the goals we discussed:  Goals      Client will verbalize knowledge of diabetes self-management as evidenced by Hgb A1C <7 or as defined by provider.         Weight < 200 lb (90.719 kg)     Watch sweets Controllable  risk for heart disease reviewed for goal setting: I Heart Healthy Diet; less sugar; less fat; more exercise   Mediterranean Diet: eating primarily plant-based food such as fruits and vegetables, whole grains, legumes and nuts; replacing butter with healthy fats such as olive oil and canola oil Using herbs and spices instead of salt to flavor food Limiting red meat to no more than a few times a month Eating fish and poultry at least 2 times a week Getting plenty of exercise  2 things help pre-diabetes; exercise (30 min of walking ) or water aerobics;  Cut back on sugar               This is a list of the screening recommended for you and due dates:  Health Maintenance  Topic Date Due   Complete foot exam   Never done   Yearly kidney health urinalysis for diabetes  Never done   Zoster (Shingles) Vaccine (1 of 2) Never done   DEXA scan (bone density measurement)  Never done   Eye exam for diabetics  08/01/2021   Hemoglobin A1C  08/29/2022   Yearly kidney function blood test for diabetes  10/10/2023   Medicare Annual Wellness Visit  12/20/2023   DTaP/Tdap/Td vaccine (2 - Td or Tdap) 05/08/2026   Pneumonia Vaccine  Completed   Flu Shot  Completed   HPV Vaccine  Aged Out   COVID-19 Vaccine  Discontinued    Advanced directives: No  Conditions/risks identified: Yes; Type II Diabetes  Next appointment: Follow up in one year for your annual wellness visit.   Preventive Care 26 Years and Older, Female Preventive  care refers to lifestyle choices and visits with your health care provider that can promote health and wellness. What does preventive care include? A yearly physical exam. This is also called an annual well check. Dental exams once or twice a year. Routine eye exams. Ask your health care provider how often you should have your eyes checked. Personal lifestyle choices, including: Daily care of your teeth and gums. Regular physical activity. Eating a healthy diet. Avoiding tobacco and drug use. Limiting alcohol use. Practicing safe sex. Taking low-dose aspirin every day. Taking vitamin and mineral supplements as recommended by your health care provider. What happens during an annual well check? The services and screenings done by your health care provider during your annual well check will depend on your age, overall health, lifestyle risk factors, and family history of disease. Counseling  Your health care provider may ask you questions about your: Alcohol use. Tobacco use. Drug use. Emotional well-being. Home and relationship well-being. Sexual activity. Eating habits. History of falls. Memory and ability to understand (cognition). Work and work Statistician. Reproductive health. Screening  You may have the following tests or measurements: Height, weight, and BMI. Blood pressure. Lipid and cholesterol levels. These may be checked every 5 years, or more frequently if you are over 50 years  old. Skin check. Lung cancer screening. You may have this screening every year starting at age 83 if you have a 30-pack-year history of smoking and currently smoke or have quit within the past 15 years. Fecal occult blood test (FOBT) of the stool. You may have this test every year starting at age 39. Flexible sigmoidoscopy or colonoscopy. You may have a sigmoidoscopy every 5 years or a colonoscopy every 10 years starting at age 57. Hepatitis C blood test. Hepatitis B blood test. Sexually  transmitted disease (STD) testing. Diabetes screening. This is done by checking your blood sugar (glucose) after you have not eaten for a while (fasting). You may have this done every 1-3 years. Bone density scan. This is done to screen for osteoporosis. You may have this done starting at age 20. Mammogram. This may be done every 1-2 years. Talk to your health care provider about how often you should have regular mammograms. Talk with your health care provider about your test results, treatment options, and if necessary, the need for more tests. Vaccines  Your health care provider may recommend certain vaccines, such as: Influenza vaccine. This is recommended every year. Tetanus, diphtheria, and acellular pertussis (Tdap, Td) vaccine. You may need a Td booster every 10 years. Zoster vaccine. You may need this after age 34. Pneumococcal 13-valent conjugate (PCV13) vaccine. One dose is recommended after age 84. Pneumococcal polysaccharide (PPSV23) vaccine. One dose is recommended after age 31. Talk to your health care provider about which screenings and vaccines you need and how often you need them. This information is not intended to replace advice given to you by your health care provider. Make sure you discuss any questions you have with your health care provider. Document Released: 11/10/2015 Document Revised: 07/03/2016 Document Reviewed: 08/15/2015 Elsevier Interactive Patient Education  2017 [place] Prevention in the Home Falls can cause injuries. They can happen to people of all ages. There are many things you can do to make your home safe and to help prevent falls. What can I do on the outside of my home? Regularly fix the edges of walkways and driveways and fix any cracks. Remove anything that might make you trip as you walk through a door, such as a raised step or threshold. Trim any bushes or trees on the path to your home. Use bright outdoor lighting. Clear any walking  paths of anything that might make someone trip, such as rocks or tools. Regularly check to see if handrails are loose or broken. Make sure that both sides of any steps have handrails. Any raised decks and porches should have guardrails on the edges. Have any leaves, snow, or ice cleared regularly. Use sand or salt on walking paths during winter. Clean up any spills in your garage right away. This includes oil or grease spills. What can I do in the bathroom? Use night lights. Install grab bars by the toilet and in the tub and shower. Do not use towel bars as grab bars. Use non-skid mats or decals in the tub or shower. If you need to sit down in the shower, use a plastic, non-slip stool. Keep the floor dry. Clean up any water that spills on the floor as soon as it happens. Remove soap buildup in the tub or shower regularly. Attach bath mats securely with double-sided non-slip rug tape. Do not have throw rugs and other things on the floor that can make you trip. What can I do in the bedroom? Use night  lights. Make sure that you have a light by your bed that is easy to reach. Do not use any sheets or blankets that are too big for your bed. They should not hang down onto the floor. Have a firm chair that has side arms. You can use this for support while you get dressed. Do not have throw rugs and other things on the floor that can make you trip. What can I do in the kitchen? Clean up any spills right away. Avoid walking on wet floors. Keep items that you use a lot in easy-to-reach places. If you need to reach something above you, use a strong step stool that has a grab bar. Keep electrical cords out of the way. Do not use floor polish or wax that makes floors slippery. If you must use wax, use non-skid floor wax. Do not have throw rugs and other things on the floor that can make you trip. What can I do with my stairs? Do not leave any items on the stairs. Make sure that there are handrails  on both sides of the stairs and use them. Fix handrails that are broken or loose. Make sure that handrails are as long as the stairways. Check any carpeting to make sure that it is firmly attached to the stairs. Fix any carpet that is loose or worn. Avoid having throw rugs at the top or bottom of the stairs. If you do have throw rugs, attach them to the floor with carpet tape. Make sure that you have a light switch at the top of the stairs and the bottom of the stairs. If you do not have them, ask someone to add them for you. What else can I do to help prevent falls? Wear shoes that: Do not have high heels. Have rubber bottoms. Are comfortable and fit you well. Are closed at the toe. Do not wear sandals. If you use a stepladder: Make sure that it is fully opened. Do not climb a closed stepladder. Make sure that both sides of the stepladder are locked into place. Ask someone to hold it for you, if possible. Clearly mark and make sure that you can see: Any grab bars or handrails. First and last steps. Where the edge of each step is. Use tools that help you move around (mobility aids) if they are needed. These include: Canes. Walkers. Scooters. Crutches. Turn on the lights when you go into a dark area. Replace any light bulbs as soon as they burn out. Set up your furniture so you have a clear path. Avoid moving your furniture around. If any of your floors are uneven, fix them. If there are any pets around you, be aware of where they are. Review your medicines with your doctor. Some medicines can make you feel dizzy. This can increase your chance of falling. Ask your doctor what other things that you can do to help prevent falls. This information is not intended to replace advice given to you by your health care provider. Make sure you discuss any questions you have with your health care provider. Document Released: 08/10/2009 Document Revised: 03/21/2016 Document Reviewed:  11/18/2014 Elsevier Interactive Patient Education  2017 [employer].

## 2022-12-19 NOTE — Telephone Encounter (Signed)
Forwarding to referrals to check w/ who except her insurance and schedule.Marland KitchenJohny Miller

## 2022-12-19 NOTE — Telephone Encounter (Signed)
[facility] called and said the provider the referral was sent to does not work at their location. The patient will likely need to be referred to a different provider or a different location. Callback for that office is [phone].

## 2022-12-30 ENCOUNTER — Ambulatory Visit: Payer: Medicare HMO | Attending: Cardiovascular Disease | Admitting: *Deleted

## 2022-12-30 DIAGNOSIS — Z7901 Long term (current) use of anticoagulants: Secondary | ICD-10-CM

## 2022-12-30 DIAGNOSIS — I4891 Unspecified atrial fibrillation: Secondary | ICD-10-CM | POA: Diagnosis not present

## 2022-12-30 DIAGNOSIS — Z9889 Other specified postprocedural states: Secondary | ICD-10-CM

## 2022-12-30 LAB — POCT INR: INR: 2.6 (ref 2.0–3.0)

## 2022-12-30 NOTE — Patient Instructions (Signed)
Description   Continue taking Warfarin '10mg'$  (white tablet) daily. Recheck INR in 4 weeks. Stay consistent with greens 1-2 servings a week of greens.  [facility] [phone]

## 2023-01-02 ENCOUNTER — Ambulatory Visit: Payer: Medicare HMO | Admitting: Internal Medicine

## 2023-01-14 ENCOUNTER — Telehealth: Payer: Self-pay | Admitting: Internal Medicine

## 2023-01-14 DIAGNOSIS — E119 Type 2 diabetes mellitus without complications: Secondary | ICD-10-CM

## 2023-01-14 NOTE — Telephone Encounter (Signed)
Look up referral to [facility] it states"  Please route referral to a different office due to a shortage of providers.  [facility] is currently filled until December 2024. [place]".Marland KitchenJohny Chess Dx- Diabetes  Place new referral../lmb

## 2023-01-14 NOTE — Telephone Encounter (Signed)
[place] and Stated that Dr.Hung is unable to see pt reasoning are place in referral notes.

## 2023-01-23 ENCOUNTER — Ambulatory Visit: Payer: Medicare HMO | Attending: Cardiology

## 2023-01-23 DIAGNOSIS — Z7901 Long term (current) use of anticoagulants: Secondary | ICD-10-CM | POA: Diagnosis not present

## 2023-01-23 DIAGNOSIS — I4891 Unspecified atrial fibrillation: Secondary | ICD-10-CM | POA: Diagnosis not present

## 2023-01-23 LAB — POCT INR: INR: 5.5 — AB (ref 2.0–3.0)

## 2023-01-23 NOTE — Patient Instructions (Addendum)
Description   HOLD Warfarin today and tomorrow and then continue taking Warfarin 10mg  (white tablet) daily. Recheck INR in 1 week. Stay consistent with greens 1-2 servings a week of greens.  [facility] [phone]

## 2023-01-27 ENCOUNTER — Ambulatory Visit: Payer: Medicare HMO

## 2023-01-30 ENCOUNTER — Ambulatory Visit: Payer: Medicare HMO | Attending: Cardiology

## 2023-01-30 DIAGNOSIS — Z7901 Long term (current) use of anticoagulants: Secondary | ICD-10-CM | POA: Diagnosis not present

## 2023-01-30 DIAGNOSIS — I4891 Unspecified atrial fibrillation: Secondary | ICD-10-CM

## 2023-01-30 LAB — POCT INR: INR: 2.3 (ref 2.0–3.0)

## 2023-01-30 NOTE — Patient Instructions (Signed)
Description   Take 1.5 tablets today and then continue taking Warfarin 10mg  (white tablet) daily.  Recheck INR in 2 weeks. Stay consistent with greens 1-2 servings a week of greens.  [facility] [phone]

## 2023-02-10 DIAGNOSIS — E1165 Type 2 diabetes mellitus with hyperglycemia: Secondary | ICD-10-CM | POA: Diagnosis not present

## 2023-02-10 DIAGNOSIS — I4821 Permanent atrial fibrillation: Secondary | ICD-10-CM | POA: Diagnosis not present

## 2023-02-13 ENCOUNTER — Ambulatory Visit: Payer: Medicare HMO | Attending: Cardiology

## 2023-02-13 DIAGNOSIS — E1165 Type 2 diabetes mellitus with hyperglycemia: Secondary | ICD-10-CM | POA: Diagnosis not present

## 2023-02-24 ENCOUNTER — Ambulatory Visit: Payer: Medicare HMO | Attending: Cardiology | Admitting: Pharmacist

## 2023-02-24 DIAGNOSIS — Z9889 Other specified postprocedural states: Secondary | ICD-10-CM

## 2023-02-24 DIAGNOSIS — Z7901 Long term (current) use of anticoagulants: Secondary | ICD-10-CM | POA: Diagnosis not present

## 2023-02-24 DIAGNOSIS — I4891 Unspecified atrial fibrillation: Secondary | ICD-10-CM | POA: Diagnosis not present

## 2023-02-24 LAB — POCT INR: INR: 3.6 — AB (ref 2.0–3.0)

## 2023-02-24 NOTE — Patient Instructions (Addendum)
Description   Take 1/2 tablet today and then continue taking Warfarin 10mg  (white tablet) daily.  Recheck INR in 2 weeks. Stay consistent with greens 1-2 servings a week of greens.  [facility] [phone]

## 2023-03-09 ENCOUNTER — Other Ambulatory Visit: Payer: Self-pay | Admitting: Cardiovascular Disease

## 2023-03-09 DIAGNOSIS — I4821 Permanent atrial fibrillation: Secondary | ICD-10-CM

## 2023-03-10 ENCOUNTER — Telehealth: Payer: Self-pay | Admitting: *Deleted

## 2023-03-10 ENCOUNTER — Ambulatory Visit: Payer: Medicare HMO

## 2023-03-10 NOTE — Telephone Encounter (Signed)
Warfarin refill Afib, MVR Last INR 02/24/23 & pending appt today Last OV 10/14/22

## 2023-03-10 NOTE — Telephone Encounter (Signed)
Called pt since she missed her Anticoagulation Appointment today; there was no answer therefore, left a message to call back. Will await and follow up.

## 2023-03-11 ENCOUNTER — Ambulatory Visit: Payer: Medicare HMO | Attending: Cardiology

## 2023-03-11 DIAGNOSIS — Z7901 Long term (current) use of anticoagulants: Secondary | ICD-10-CM | POA: Diagnosis not present

## 2023-03-11 DIAGNOSIS — Z9889 Other specified postprocedural states: Secondary | ICD-10-CM | POA: Diagnosis not present

## 2023-03-11 DIAGNOSIS — I4891 Unspecified atrial fibrillation: Secondary | ICD-10-CM

## 2023-03-11 LAB — POCT INR: INR: 3.1 — AB (ref 2.0–3.0)

## 2023-03-11 NOTE — Patient Instructions (Signed)
continue taking Warfarin 10mg  (white tablet) daily.  Recheck INR in 4 weeks. Stay consistent with greens 1-2 servings a week of greens.  [facility] [phone]

## 2023-03-19 ENCOUNTER — Other Ambulatory Visit: Payer: Self-pay | Admitting: Cardiovascular Disease

## 2023-04-08 ENCOUNTER — Ambulatory Visit: Payer: Medicare HMO | Attending: Cardiovascular Disease

## 2023-04-08 DIAGNOSIS — Z7901 Long term (current) use of anticoagulants: Secondary | ICD-10-CM | POA: Diagnosis not present

## 2023-04-08 DIAGNOSIS — Z9889 Other specified postprocedural states: Secondary | ICD-10-CM | POA: Diagnosis not present

## 2023-04-08 LAB — POCT INR: INR: 3.1 — AB (ref 2.0–3.0)

## 2023-04-08 NOTE — Patient Instructions (Signed)
continue taking Warfarin 10mg  (white tablet) daily.  Recheck INR in 6 weeks. Stay consistent with greens 1-2 servings a week of greens.  [facility] [phone]

## 2023-04-24 ENCOUNTER — Ambulatory Visit: Payer: Medicare HMO | Admitting: Internal Medicine

## 2023-04-24 ENCOUNTER — Other Ambulatory Visit: Payer: Self-pay | Admitting: Cardiovascular Disease

## 2023-04-24 DIAGNOSIS — I4821 Permanent atrial fibrillation: Secondary | ICD-10-CM

## 2023-04-24 NOTE — Telephone Encounter (Signed)
Warfarin 10mg  refill MITRAL VALVE REPLACEMENT, Atrial fibrillation  Last INR 04/08/23 Last OV 10/14/22

## 2023-05-05 DIAGNOSIS — E1165 Type 2 diabetes mellitus with hyperglycemia: Secondary | ICD-10-CM | POA: Diagnosis not present

## 2023-05-12 DIAGNOSIS — I4821 Permanent atrial fibrillation: Secondary | ICD-10-CM | POA: Diagnosis not present

## 2023-05-12 DIAGNOSIS — E1165 Type 2 diabetes mellitus with hyperglycemia: Secondary | ICD-10-CM | POA: Diagnosis not present

## 2023-05-13 ENCOUNTER — Ambulatory Visit: Payer: Medicare HMO | Admitting: Internal Medicine

## 2023-05-15 ENCOUNTER — Telehealth: Payer: Self-pay | Admitting: Cardiovascular Disease

## 2023-05-15 DIAGNOSIS — R600 Localized edema: Secondary | ICD-10-CM

## 2023-05-15 DIAGNOSIS — E1165 Type 2 diabetes mellitus with hyperglycemia: Secondary | ICD-10-CM | POA: Diagnosis not present

## 2023-05-15 MED ORDER — FUROSEMIDE 40 MG PO TABS: 40.0000 mg | ORAL_TABLET | Freq: Every day | ORAL | 3 refills | Status: DC

## 2023-05-15 NOTE — Telephone Encounter (Signed)
Per Dr. [name], [facility] to take lasix 40 mg daily check BMET/BNP in a week. Patient will come in next week for lab work. Patient will increase her furosemide to 40 mg daily. Patient verbalized understanding.

## 2023-05-15 NOTE — Telephone Encounter (Signed)
Pt c/o swelling/edema: STAT if pt has developed SOB within 24 hours  If swelling, where is the swelling located? Legs, ankles and feet  How much weight have you gained and in what time span? N/A  Have you gained 2 pounds in a day or 5 pounds in a week? No  Do you have a log of your daily weights (if so, list)? No  Are you currently taking a fluid pill? Yes  Are you currently SOB? No  Have you traveled recently in a car or plane for an extended period of time? No

## 2023-05-15 NOTE — Telephone Encounter (Signed)
Called patient back about her edema. Patient stated that she has pitting edema that has been going on for about 2 months. Patient stated she has been dealing with BLE edema for  a while, but before her swelling was going down at night. Patient is currently taking lasix 20 mg by mouth daily. Patient would like to increase her lasix. Patient stated she has been on lasix 40 mg in the past. Patient stated it was reduced to 20 mg after an abnormal digoxin level. Patient denies any other symptoms. Made patient first available with APP 05/28/23. Encouraged patient to elevate her legs and reduce salt in her diet. Patient verbalized understanding. Will send message to Dr. [name] for further advisement.

## 2023-05-20 ENCOUNTER — Ambulatory Visit: Payer: Medicare HMO

## 2023-05-20 DIAGNOSIS — Z9889 Other specified postprocedural states: Secondary | ICD-10-CM

## 2023-05-20 DIAGNOSIS — Z7901 Long term (current) use of anticoagulants: Secondary | ICD-10-CM

## 2023-05-20 LAB — POCT INR: INR: 2.9 (ref 2.0–3.0)

## 2023-05-20 NOTE — Patient Instructions (Signed)
continue taking Warfarin 10mg  (white tablet) daily.  Recheck INR in 6 weeks. Stay consistent with greens 1-2 servings a week of greens.  [facility] [phone]

## 2023-05-22 ENCOUNTER — Ambulatory Visit: Payer: Medicare HMO | Attending: Cardiovascular Disease

## 2023-05-22 ENCOUNTER — Encounter (HOSPITAL_COMMUNITY): Payer: Self-pay | Admitting: Emergency Medicine

## 2023-05-22 ENCOUNTER — Ambulatory Visit (HOSPITAL_COMMUNITY)
Admission: EM | Admit: 2023-05-22 | Discharge: 2023-05-22 | Disposition: A | Discharge status: Home / Self Care | Payer: Medicare HMO | Attending: Family Medicine | Admitting: Family Medicine

## 2023-05-22 DIAGNOSIS — J069 Acute upper respiratory infection, unspecified: Secondary | ICD-10-CM | POA: Diagnosis not present

## 2023-05-22 DIAGNOSIS — I130 Hypertensive heart and chronic kidney disease with heart failure and stage 1 through stage 4 chronic kidney disease, or unspecified chronic kidney disease: Secondary | ICD-10-CM | POA: Insufficient documentation

## 2023-05-22 DIAGNOSIS — R600 Localized edema: Secondary | ICD-10-CM

## 2023-05-22 DIAGNOSIS — R000 Tachycardia, unspecified: Secondary | ICD-10-CM

## 2023-05-22 DIAGNOSIS — I5032 Chronic diastolic (congestive) heart failure: Secondary | ICD-10-CM | POA: Diagnosis not present

## 2023-05-22 DIAGNOSIS — U071 COVID-19: Secondary | ICD-10-CM | POA: Insufficient documentation

## 2023-05-22 LAB — POCT INFLUENZA A/B
Influenza A, POC: NEGATIVE
Influenza B, POC: NEGATIVE

## 2023-05-22 MED ORDER — ACETAMINOPHEN 325 MG PO TABS
650.0000 mg | ORAL_TABLET | Freq: Once | ORAL | Status: AC
  Administered 2023-05-22: 650 mg via ORAL

## 2023-05-22 MED ORDER — ACETAMINOPHEN 325 MG PO TABS
ORAL_TABLET | ORAL | Status: AC
  Filled 2023-05-22: qty 2
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
## 2023-05-22 NOTE — Progress Notes (Deleted)
Cardiology Office Note:  .   Date:  05/22/2023  ID:  Beth Miller, DOB 1940-10-29, MRN [id number] PCP: Tresa Garter, MD  [facility] Providers Cardiologist:  Charlton Haws, MD { Click to update primary MD,subspecialty MD or APP then REFRESH:1}   Patient Profile: .      PMH: Permanent atrial fibrillation Childhood rheumatic fever Mechanical mitral valve replacement on chronic Coumadin St Jude prosthetic mitral valve replacement 1995 Former smoker COPD Carotid artery disease Aortic atherosclerosis OSA Noncompliant with CPAP, does not want to wear mask or repeat study Type 2 DM Hyperlipidemia Gout Digoxin toxicity January 2022 dig level > 3 with junctional bigeminy on EKG with resolved with DC of digoxin  She established care with our group in 2013 and has maintained consistent follow-up.  No documentation of CAD, low risk Myoview 2014.  In January 2022 she had digoxin toxicity with level over 3 and junctional bigeminy on EKG which resolved with discontinuation of digoxin.  Posterior monitor 12/12/2020 revealed A-fib with rates in the 70s, no malignant ventricular beats or junctional rhythm.  In April 2022 she was hospitalized with sepsis unknown source and A-fib rates were elevated during admission.  TEE was advised but patient left before it could be completed.  TTE 02/10/2021 revealed EF 55%, severe biatrial enlargement, stable MVR Saint Jude mechanical valve with mean gradient 4, trivial MR, no obvious SBE, moderate to severe TR.  Seen by Dr. Mayford Knife for severe OSA but has issues wearing CPAP mask and does not want to repeat study or wear a mask.  History of recurrent gout and right great toe which is very painful.  Last cardiology clinic visit was 10/14/2022 with Dr. [name] at which time she continued to report symptoms of fatigue, felt to be multifactorial by Dr. [name] including age, a fib, and poor compliance with CPAP.  She had recent URI and was advised to  follow-up with PCP.  She was advised to return to cardiology clinic in 6 months for follow-up.       History of Present Illness: .   Beth Miller is a *** 82 y.o. female who is here today for 6 month follow-up.   ROS: ***       Studies Reviewed: .        *** Risk Assessment/Calculations:    CHA2DS2-VASc Score =    {Confirm score is correct.  If not, click here to update score.  REFRESH note.  :1} This indicates a  % annual risk of stroke. The patient's score is based upon:    {This patient has a significant risk of stroke if diagnosed with atrial fibrillation.  Please consider VKA or DOAC agent for anticoagulation if the bleeding risk is acceptable.   You can also use the SmartPhrase .[email or web address] for documentation.   :[id number]} No BP recorded.  {Refresh Note OR Click here to enter BP  :1}***   STOP-Bang Score:     { Consider Dx Sleep Disordered Breathing or Sleep Apnea  ICD G47.33          :1}    Physical Exam:   VS:  LMP  (LMP Unknown)    Wt Readings from Last 3 Encounters:  12/19/22 194 lb (88 kg)  12/04/22 194 lb (88 kg)  10/25/22 189 lb (85.7 kg)    GEN: Well nourished, well developed in no acute distress NECK: No JVD; No carotid bruits CARDIAC: ***RRR, no murmurs, rubs, gallops RESPIRATORY:  Clear to auscultation without rales, wheezing  or rhonchi  ABDOMEN: Soft, non-tender, non-distended EXTREMITIES:  No edema; No deformity     ASSESSMENT AND PLAN: .    Mitral valve disease s/p MVR on chronic anticoagulation: Mitral valve replaced 1995. On chronic coumadin therapy, managed by our coumadin clinic.  Permanent atrial fibrillation: Severe biatrial enlargement on echo 01/2021.  Aortic atherosclerosis/Hyperlipidemia LDL goal < 70:    {Are you ordering a CV Procedure (e.g. stress test, cath, DCCV, TEE, etc)?   Press F2        :[id number]}  Dispo: ***  Signed, Eligha Bridegroom, NP-C

## 2023-05-22 NOTE — ED Triage Notes (Signed)
Pt had cough, sore throat, fatigue, body aches since yesterday. Hasn't taken any medications for her symptoms.

## 2023-05-22 NOTE — Discharge Instructions (Signed)
Your flu test was negative.  We gave you tylenol/acetaminophen 650 mg here  Your EKG showed a fast heart rate.   You have been swabbed for COVID, and the test will result in the next 24 hours. Our staff will call you if positive. If the COVID test is positive, you should quarantine until you are fever free for 24 hours and you are starting to feel better, and then take added precautions for the next 5 days, such as physical distancing/wearing a mask and good hand hygiene/washing.  I want you to proceed to the ER today for further evaluation due to your fast hear rate not responding to your fever being better.

## 2023-05-23 ENCOUNTER — Other Ambulatory Visit: Payer: Self-pay | Admitting: Cardiovascular Disease

## 2023-05-23 ENCOUNTER — Telehealth (HOSPITAL_COMMUNITY): Payer: Self-pay | Admitting: Emergency Medicine

## 2023-05-23 DIAGNOSIS — I4821 Permanent atrial fibrillation: Secondary | ICD-10-CM

## 2023-05-23 LAB — BASIC METABOLIC PANEL: CO2: 26 mmol/L (ref 20–29)

## 2023-05-23 MED ORDER — MOLNUPIRAVIR EUA 200MG CAPSULE: 4.0000 | ORAL_CAPSULE | Freq: Two times a day (BID) | ORAL | 0 refills | Status: AC

## 2023-05-26 ENCOUNTER — Ambulatory Visit: Payer: Medicare HMO | Admitting: Internal Medicine

## 2023-05-28 ENCOUNTER — Encounter: Payer: Self-pay | Admitting: Nurse Practitioner

## 2023-05-28 ENCOUNTER — Ambulatory Visit: Payer: Medicare HMO | Attending: Nurse Practitioner | Admitting: Nurse Practitioner

## 2023-06-01 ENCOUNTER — Other Ambulatory Visit: Payer: Self-pay | Admitting: Internal Medicine

## 2023-06-04 NOTE — Progress Notes (Unsigned)
0 Cardiology Office Note:    Date:  06/05/2023   ID:  Beth Miller, DOB 12/04/1940, MRN [id number]  PCP:  Tresa Garter, MD   [facility] Providers Cardiologist:  Charlton Haws, MD     Referring MD: Tresa Garter, MD    History of Present Illness:    Beth Miller is a very pleasant 82 y.o. female with a hx of longstanding atrial fib, mechanical MVR on chronic Coumadin, former smoker with COPD, DM, PVD, aortic atherosclerosis seen on CT, and carotid artery disease. She requires life-time SBE prophylaxis. Had childhood rheumatic fever that required hospitalization.   Prosthetic mitral valve replacement 1995, on coumadin. She established care with our group prior to 2013 and has maintained regular follow-up. No documentation of CAD, low risk myoview 2014.   In January 2022 she had digoxin toxicity with level over 3 and junctional bigeminy on EKG which resolved with d/c of digoxin.  Post monitor 12/12/2020 shows A-fib with rates in the 70s, no malignant ventricular beats or junctional rhythm.  In April 2022 she was hospitalized with sepsis unknown source and atrial fibrillation rates were elevated during admission.  Medical management with Cardizem and Toprol. TEE was advised but patient left before it could be completed. TTE 02/10/21 revealed EF 55%, severe biatrial enlargement, stable MVR Saint Jude mechanical valve with mean gradient 4, trivial MR, no obvious SBE, moderate to severe TR.   She has had some LE edema improved with lasix She has seen Dr Mayford Knife for severe OSA but has issues wearing CPAP mask She does not want to repeat study or wear mask  Has recurrent gout in right great toe Painful  She has had a congestive cough and URI seen in ED 10/09/22 and resp panel negative CXR CE chronic bronchitic changes Given prednisone 10 mg to take daily  COVID positive 05/22/23 Cough / sore throat with fever 102.5 Afib rate up during illness Did not appear to take molnupiravir  Cannot take Paxlovid on coumadin per ER but I think this is relative if INR checked more frequently   Recovered well Helped husband celebrate 18 th birthday. LE edema better on lasix Has LE varicosities     Past Medical History:  Diagnosis Date   AF (atrial fibrillation) (HCC)    ALLERGIC RHINITIS    Blood transfusion without reported diagnosis    Cataract, senile    Chest pain    CHF (congestive heart failure) (HCC)    Chronic anticoagulation    Diabetes mellitus    Diabetes mellitus    Disorder of oral soft tissue    of mouth   Diverticulosis    GERD (gastroesophageal reflux disease)    Hematoma    Hypertension    Leg pain    Low back pain    Paresthesia    Rheumatic fever    Status post mitral valve replacement    St. Jude valve   Stroke (HCC)    tia   Sweating    Vitamin D deficiency     Past Surgical History:  Procedure Laterality Date   ABDOMINAL HYSTERECTOMY     CARDIAC VALVE REPLACEMENT  1995   Mitral valve prosthesis; st jude   CHOLECYSTECTOMY      Current Medications: Current Meds  Medication Sig   Accu-Chek Softclix Lancets lancets 1 each by Other route 2 (two) times daily. Use to check blood sugars twice a day   acetaminophen (TYLENOL) 500 MG tablet Take 1,000 mg by  mouth every 6 (six) hours as needed for mild pain.   Alcohol Swabs (B-D SINGLE USE SWABS REGULAR) PADS 1 each by Does not apply route 2 (two) times daily. Use to clean finger to check blood sugars twice a day   Cholecalciferol (VITAMIN D3) 1000 UNIT tablet Take 1,000 Units by mouth daily.   colchicine 0.6 MG tablet Take two tablets prn gout attack.Then take another one in 1-2 hrs. Do not repeat for 3 days.   cyclobenzaprine (FLEXERIL) 10 MG tablet take 1/2 to 1 TABLET BY MOUTH THREE TIMES DAILY AS NEEDED FOR MUSCLE SPASMS   diltiazem (CARDIZEM CD) 240 MG 24 hr capsule TAKE 1 CAPSULE EVERY DAY   famotidine (PEPCID) 40 MG tablet Take 1 tablet (40 mg total) by mouth at bedtime.   furosemide  (LASIX) 40 MG tablet Take 1 tablet (40 mg total) by mouth daily.   gabapentin (NEURONTIN) 100 MG capsule Take 1 capsule (100 mg total) by mouth at bedtime.   glucose blood (ACCU-CHEK AVIVA PLUS) test strip TEST BLOOD SUGAR TWO TIMES DAILY   JARDIANCE 10 MG TABS tablet Take 10 mg by mouth daily.   Lancet Devices (ACCU-CHEK SOFTCLIX) lancets 1 each by Other route 2 (two) times daily. Use as instructed   loratadine (ALLERGY RELIEF) 10 MG tablet TAKE ONE TABLET BY MOUTH EVERY DAY   memantine (NAMENDA) 5 MG tablet TAKE 1 TABLET TWICE DAILY   metoprolol succinate (TOPROL-XL) 50 MG 24 hr tablet TAKE 1/2 TABLET EVERY DAY   mirabegron ER (MYRBETRIQ) 25 MG TB24 tablet Take 1 tablet (25 mg total) by mouth daily.   pantoprazole (PROTONIX) 40 MG tablet TAKE 1 TABLET EVERY MORNING   potassium chloride (KLOR-CON M) 10 MEQ tablet Take 1 tablet (10 mEq total) by mouth 2 (two) times daily.   vitamin B-12 (CYANOCOBALAMIN) 1000 MCG tablet Take 1 tablet (1,000 mcg total) by mouth daily.   warfarin (COUMADIN) 10 MG tablet TAKE 1 TABLET DAILY OR AS DIRECTED BY COUMADIN CLINIC   warfarin (COUMADIN) 5 MG tablet TAKE 1 TABLET TWICE WEEKLY ALONG WITH 10MG  DOSE OR AS ADVISED BY COUMADIN CLINIC   [DISCONTINUED] furosemide (LASIX) 20 MG tablet TAKE 1 TABLET EVERY MORNING   [DISCONTINUED] guaiFENesin-dextromethorphan (ROBITUSSIN DM) 100-10 MG/5ML syrup Take 5 mLs by mouth every 4 (four) hours as needed for cough.   [DISCONTINUED] predniSONE (DELTASONE) 10 MG tablet 2 tabs by mouth per day for 5 days   [DISCONTINUED] promethazine-dextromethorphan (PROMETHAZINE-DM) 6.25-15 MG/5ML syrup Take 5 mLs by mouth 4 (four) times daily as needed for cough.   [DISCONTINUED] tolterodine (DETROL LA) 4 MG 24 hr capsule Take 1 capsule (4 mg total) by mouth daily.     Allergies:   Aricept [donepezil hcl], Food, Furosemide, Metformin and related, Quinine, Spironolactone, Tramadol, and Sulfadiazine   Social History   Socioeconomic History    Marital status: Married    Spouse name: Not on file   Number of children: 1   Years of education: Not on file   Highest education level: Not on file  Occupational History   Occupation: Retired    Associate Professor: RETIRED  Tobacco Use   Smoking status: Former    Current packs/day: 0.00    Average packs/day: (1.2 ttl pk-yrs)    Types: Cigarettes    Start date: 10/28/1942    Quit date: 10/28/2002    Years since quitting: 20.6    Passive exposure: Never   Smokeless tobacco: Never   Tobacco comments:    quit x 13  years   Vaping Use   Vaping status: Never Used  Substance and Sexual Activity   Alcohol use: No    Alcohol/week: 0.0 standard drinks of alcohol   Drug use: No   Sexual activity: Yes    Partners: Male  Other Topics Concern   Not on file  Social History Narrative   Negative Family History of Colon Cancer      Regular exercise-yes, bowling      Daily caffeine Use-rare   Social Determinants of Health   Financial Resource Strain: Low Risk  (12/19/2022)   Overall Financial Resource Strain (CARDIA)    Difficulty of Paying Living Expenses: Not hard at all  Food Insecurity: No Food Insecurity (12/19/2022)   Hunger Vital Sign    Worried About Running Out of Food in the Last Year: Never true    Ran Out of Food in the Last Year: Never true  Transportation Needs: No Transportation Needs (12/19/2022)   PRAPARE - Administrator, Civil Service (Medical): No    Lack of Transportation (Non-Medical): No  Physical Activity: Inactive (12/19/2022)   Exercise Vital Sign    Days of Exercise per Week: 0 days    Minutes of Exercise per Session: 0 min  Stress: No Stress Concern Present (12/19/2022)   [employer] of Occupational Health - Occupational Stress Questionnaire    Feeling of Stress : Not at all  Social Connections: Socially Integrated (12/19/2022)   Social Connection and Isolation Panel [NHANES]    Frequency of Communication with Friends and Family: More than three times  a week    Frequency of Social Gatherings with Friends and Family: More than three times a week    Attends Religious Services: More than 4 times per year    Active Member of [employer] or Organizations: Yes    Attends Engineer, structural: More than 4 times per year    Marital Status: Married     Family History: The patient's family history includes Diabetes in her brother and mother; Heart disease in her brother and brother; Other in her mother; Pancreatic cancer in her brother; Seizures in her mother. There is no history of Colon cancer.  ROS:   Please see the history of present illness.    + fatigue All other systems reviewed and are negative.  Labs/Other Studies Reviewed:    The following studies were reviewed today:  Echo 02/10/21  Left Ventricle: Left ventricular ejection fraction, by estimation, is  55%%. The left ventricle has low normal function. The left ventricle has  no regional wall motion abnormalities. Definity contrast agent was given  IV to delineate the left ventricular  endocardial borders. The left ventricular internal cavity size was normal  in size. There is mild left ventricular hypertrophy. Left ventricular  diastolic parameters are indeterminate.  Right Ventricle: The right ventricular size is normal. Right vetricular  wall thickness was not assessed. Right ventricular systolic function is  low normal. There is moderately elevated pulmonary artery systolic  pressure. The tricuspid regurgitant  velocity is 3.07 m/s, and with an assumed right atrial pressure of 15  mmHg, the estimated right ventricular systolic pressure is 52.7 mmHg.  Left Atrium: Left atrial size was severely dilated.  Right Atrium: Right atrial size was severely dilated.  Pericardium: Trivial pericardial effusion is present.  Mitral Valve: MV prosthesis (St. Jude mechanical) is well seated, appears  to open well. mean gradient through the valve is approximately 4 mm Hg.  The mitral  valve has been repaired/replaced. Trivial mitral valve  regurgitation. There is a St. Jude  mechanical valve present in the mitral position. The mean mitral valve  gradient is 4.0 mmHg.  Tricuspid Valve: TR is eccentric, directed toward the interatrial septum.  The tricuspid valve is normal in structure. Tricuspid valve regurgitation  is moderate to severe.  Aortic Valve: The aortic valve is tricuspid. Aortic valve regurgitation is  not visualized. Mild to moderate aortic valve sclerosis/calcification is  present, without any evidence of aortic stenosis. Aortic valve mean  gradient measures 5.4 mmHg. Aortic  valve peak gradient measures 9.4 mmHg. Aortic valve area, by VTI measures  1.38 cm.  Pulmonic Valve: The pulmonic valve was normal in structure. Pulmonic valve  regurgitation is not visualized.  Aorta: The aortic root is normal in size and structure and aortic  dilatation noted. There is mild dilatation of the ascending aorta,  measuring 40 mm.  Venous: The inferior vena cava is dilated in size with less than 50%  respiratory variability, suggesting right atrial pressure of 15 mmHg.  IAS/Shunts: No atrial level shunt detected by color flow Doppler.   Cardiac monitor 11/2020  Frequent episodes of atrial flutter PVCls occasional Does have episodes of conversion    Myocardial Perfusion Imaging 12/2012 Normal perfusion, low risk   Recent Labs: 10/09/2022: Hemoglobin 11.6; Platelets 240 05/22/2023: BUN 14; Creatinine, Ser 1.08; NT-Pro BNP 312; Potassium 4.1; Sodium 142  Recent Lipid Panel    Component Value Date/Time   CHOL 116 01/22/2022 1120   TRIG 92 01/22/2022 1120   HDL 47 01/22/2022 1120   CHOLHDL 2.5 01/22/2022 1120   CHOLHDL 3 04/27/2019 1041   VLDL 35.4 04/27/2019 1041   LDLCALC 51 01/22/2022 1120     Risk Assessment/Calculations:    CHA2DS2-VASc Score =    {This indicates a  % annual risk of stroke. The patient's score is based upon:      Physical Exam:     VS:  BP (!) 120/58   Pulse 80   Ht 5\' 2"  (1.575 m)   Wt 189 lb (85.7 kg)   LMP  (LMP Unknown)   SpO2 94%   BMI 34.57 kg/m     Wt Readings from Last 3 Encounters:  06/05/23 189 lb (85.7 kg)  12/19/22 194 lb (88 kg)  12/04/22 194 lb (88 kg)     Affect appropriate Overweight black female  HEENT: normal Neck supple with no adenopathy JVP normal no bruits no thyromegaly Lungs  rhonchi and expitory wheezing  Heart:  S1/ click S2 no murmur, no rub, gallop or click PMI normal Abdomen: benighn, BS positve, no tenderness, no AAA no bruit.  No HSM or HJR Distal pulses intact with no bruits Trace  edema with varicosities  Neuro non-focal Gout with swelling/erythema right > toe    EKG:   05/14/21 afib rate 112 nonspecific ST changes  06/05/2023 afib rate 90 nonspecific ST changes   Diagnoses:    No diagnosis found.   Assessment and Plan:     Mitral valve disese, rheumatic/Hx MVR on chronic anticoagulation: Mitral valve replaced 1995.  Normal valve click without signs and symptoms of worsening valve function. Normal valve function by echo 01/2021. Management of coumadin by our coumadin clinic.   Dyspnea: LVEF 55%, no rwma, mild LVH, indeterminate diastolic parameters by echo 01/2021. Lasix dose increased BNP was normal 11/15/21 Myovue done 01/31/22 no ischemia stable   OSA: re established with Dr Mayford Knife severe OSA compliance with mask discussed  Permanent atrial fibrillation: Severe bi-atrial enlargement by echo 01/2021. Dig toxicity in past beta blocker decreased by primary 04/09/22 for low pulse  She will take Cardizem in am and Toprol at lunch   Aortic atherosclerosis/Hyperlipidemia LDL goal < 70: LDL   51 on current statin Rx   Fatigue: related to age, afib and poor compliance with CPAP   DM:  metformin d/c due to stomach upset A1c 8.2 f/u Dr Paulette Blanch Started on Prandin   GERD:  not much better off Metformin Creon did not help PRN Imodium Continue Protonix and pepcid    Urinary Incontinence:  continue Myrbetriq  Gout:  left great toe f/u Plotnicov post Rx with colchicine Uric acid elevated 7.5 f/u primary consider allopurinol   URI:  flu/covid/RSV negative congestive cough on prednisone per ER CXR ok mild leukocytosis f/u Dr Paulette Blanch   Disposition: F/U in  6 months      Signed, Charlton Haws, MD  06/05/2023 11:01 AM    [facility]

## 2023-06-05 ENCOUNTER — Encounter: Payer: Self-pay | Admitting: Cardiovascular Disease

## 2023-06-05 ENCOUNTER — Ambulatory Visit: Payer: Medicare HMO | Attending: Cardiovascular Disease | Admitting: Cardiovascular Disease

## 2023-06-05 VITALS — BP 120/58 | HR 80 | Ht 62.0 in | Wt 189.0 lb

## 2023-06-05 DIAGNOSIS — Z9889 Other specified postprocedural states: Secondary | ICD-10-CM

## 2023-06-05 DIAGNOSIS — E785 Hyperlipidemia, unspecified: Secondary | ICD-10-CM

## 2023-06-05 DIAGNOSIS — Z7901 Long term (current) use of anticoagulants: Secondary | ICD-10-CM | POA: Diagnosis not present

## 2023-06-05 DIAGNOSIS — I4891 Unspecified atrial fibrillation: Secondary | ICD-10-CM

## 2023-06-05 NOTE — Patient Instructions (Signed)
Medication Instructions:  Your physician recommends that you continue on your current medications as directed. Please refer to the Current Medication list given to you today.  *If you need a refill on your cardiac medications before your next appointment, please call your pharmacy*  Lab Work: If you have labs (blood work) drawn today and your tests are completely normal, you will receive your results only by: University (if you have MyChart) OR A paper copy in the mail If you have any lab test that is abnormal or we need to change your treatment, we will call you to review the results.  Testing/Procedures: None ordered today.   Follow-Up: At [facility], you and your health needs are our priority.  As part of our continuing mission to provide you with exceptional heart care, we have created designated Provider Care Teams.  These Care Teams include your primary Cardiologist (physician) and Advanced Practice Providers (APPs -  Physician Assistants and Nurse Practitioners) who all work together to provide you with the care you need, when you need it.  We recommend signing up for the patient portal called "MyChart".  Sign up information is provided on this After Visit Summary.  MyChart is used to connect with patients for Virtual Visits (Telemedicine).  Patients are able to view lab/test results, encounter notes, upcoming appointments, etc.  Non-urgent messages can be sent to your provider as well.   To learn more about what you can do with MyChart, go to [email or web address].    Your next appointment:   6 month  Provider:   Jenkins Rouge, MD

## 2023-06-09 ENCOUNTER — Ambulatory Visit (HOSPITAL_COMMUNITY)
Admission: EM | Admit: 2023-06-09 | Discharge: 2023-06-09 | Disposition: A | Discharge status: Home / Self Care | Payer: Medicare HMO

## 2023-06-09 ENCOUNTER — Encounter (HOSPITAL_COMMUNITY): Payer: Self-pay

## 2023-06-09 DIAGNOSIS — S8011XA Contusion of right lower leg, initial encounter: Secondary | ICD-10-CM | POA: Diagnosis not present
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
## 2023-06-09 NOTE — Discharge Instructions (Addendum)
Try using compression socks or an ace wrap to your right leg to help the swelling go down. Prop up your right leg whenever you are at rest, but do go about your usual life activities - don't stay on bed rest.   Use tylenol for pain per package instructions. If you have hydrocodone or oxycodone at home, you can take that for pain also per package instructions. Pay attention to whether or not your prescription pain medicine also has tylenol (acetaminophen) in it. You can have up to 4,000 mg of tylenol (acetaminophen) each day safely.

## 2023-06-09 NOTE — ED Triage Notes (Signed)
right leg and foot injury onset this past Friday after slipping out her back door. Went down the stoop to the final step. States she scrapped down in inner right lower leg and foot. Soreness better with tylenol.   Swelling in the leg and ankle on the right.

## 2023-06-13 ENCOUNTER — Telehealth: Payer: Self-pay | Admitting: Internal Medicine

## 2023-06-13 NOTE — Telephone Encounter (Signed)
Place form on MD desk to complete../lmb 

## 2023-06-13 NOTE — Telephone Encounter (Signed)
Patient dropped off document [employer] Verification of Chronic Condition, to be filled out by provider. Patient requested to send it back via Call Patient to pick up within 7-days. Document is located in providers tray at front office.Please advise at Mobile [phone] (mobile)

## 2023-06-20 NOTE — Telephone Encounter (Signed)
Done. Thank you.

## 2023-06-20 NOTE — Telephone Encounter (Signed)
Faxed form to [facility] Verification of Chronic Condition,../lmb

## 2023-06-25 ENCOUNTER — Encounter: Payer: Self-pay | Admitting: Internal Medicine

## 2023-06-25 ENCOUNTER — Ambulatory Visit (INDEPENDENT_AMBULATORY_CARE_PROVIDER_SITE_OTHER): Payer: Medicare HMO | Admitting: Internal Medicine

## 2023-06-25 VITALS — BP 110/60 | HR 91 | Temp 97.8°F | Ht 62.0 in | Wt 189.0 lb

## 2023-06-25 DIAGNOSIS — N1831 Chronic kidney disease, stage 3a: Secondary | ICD-10-CM | POA: Diagnosis not present

## 2023-06-25 DIAGNOSIS — I4821 Permanent atrial fibrillation: Secondary | ICD-10-CM | POA: Diagnosis not present

## 2023-06-25 DIAGNOSIS — R5383 Other fatigue: Secondary | ICD-10-CM | POA: Diagnosis not present

## 2023-06-25 DIAGNOSIS — D638 Anemia in other chronic diseases classified elsewhere: Secondary | ICD-10-CM | POA: Diagnosis not present

## 2023-06-25 DIAGNOSIS — R202 Paresthesia of skin: Secondary | ICD-10-CM | POA: Diagnosis not present

## 2023-06-25 DIAGNOSIS — Z7984 Long term (current) use of oral hypoglycemic drugs: Secondary | ICD-10-CM | POA: Diagnosis not present

## 2023-06-25 DIAGNOSIS — E118 Type 2 diabetes mellitus with unspecified complications: Secondary | ICD-10-CM

## 2023-06-25 DIAGNOSIS — I4891 Unspecified atrial fibrillation: Secondary | ICD-10-CM

## 2023-06-25 DIAGNOSIS — E559 Vitamin D deficiency, unspecified: Secondary | ICD-10-CM

## 2023-06-25 DIAGNOSIS — Z7901 Long term (current) use of anticoagulants: Secondary | ICD-10-CM | POA: Diagnosis not present

## 2023-06-25 LAB — URINALYSIS, ROUTINE W REFLEX MICROSCOPIC
Bilirubin Urine: NEGATIVE
Ketones, ur: NEGATIVE
Leukocytes,Ua: NEGATIVE
Nitrite: POSITIVE — AB
Specific Gravity, Urine: 1.01 (ref 1.000–1.030)
Total Protein, Urine: NEGATIVE
Urine Glucose: >=1000 — AB
Urobilinogen, UA: 1 (ref 0.0–1.0)
pH: 6.5 (ref 5.0–8.0)

## 2023-06-25 LAB — COMPREHENSIVE METABOLIC PANEL
ALT: 13 U/L (ref 0–35)
AST: 23 U/L (ref 0–37)
Albumin: 4.1 g/dL (ref 3.5–5.2)
Alkaline Phosphatase: 95 U/L (ref 39–117)
BUN: 17 mg/dL (ref 6–23)
CO2: 32 mEq/L (ref 19–32)
Calcium: 10 mg/dL (ref 8.4–10.5)
Chloride: 100 mEq/L (ref 96–112)
Creatinine, Ser: 1.17 mg/dL (ref 0.40–1.20)
GFR: 43.04 mL/min — ABNORMAL LOW (ref >60.00)
Glucose, Bld: 131 mg/dL — ABNORMAL HIGH (ref 70–99)
Potassium: 3.9 mEq/L (ref 3.5–5.1)
Sodium: 140 mEq/L (ref 135–145)
Total Bilirubin: 1 mg/dL (ref 0.2–1.2)
Total Protein: 7.5 g/dL (ref 6.0–8.3)

## 2023-06-25 LAB — T4, FREE: Free T4: 1.05 ng/dL (ref 0.60–1.60)

## 2023-06-25 LAB — CBC WITH DIFFERENTIAL/PLATELET
Basophils Absolute: 0 10*3/uL (ref 0.0–0.1)
Basophils Relative: 0.5 % (ref 0.0–3.0)
Eosinophils Absolute: 0.1 10*3/uL (ref 0.0–0.7)
Eosinophils Relative: 1.1 % (ref 0.0–5.0)
HCT: 36.5 % (ref 36.0–46.0)
Hemoglobin: 11.6 g/dL — ABNORMAL LOW (ref 12.0–15.0)
Lymphocytes Relative: 27.7 % (ref 12.0–46.0)
Lymphs Abs: 1.4 10*3/uL (ref 0.7–4.0)
MCHC: 31.7 g/dL (ref 30.0–36.0)
MCV: 82 fl (ref 78.0–100.0)
Monocytes Absolute: 0.7 10*3/uL (ref 0.1–1.0)
Monocytes Relative: 14.4 % — ABNORMAL HIGH (ref 3.0–12.0)
Neutro Abs: 2.8 10*3/uL (ref 1.4–7.7)
Neutrophils Relative %: 56.3 % (ref 43.0–77.0)
Platelets: 231 10*3/uL (ref 150.0–400.0)
RBC: 4.45 Mil/uL (ref 3.87–5.11)
RDW: 18.1 % — ABNORMAL HIGH (ref 11.5–15.5)
WBC: 5 10*3/uL (ref 4.0–10.5)

## 2023-06-25 LAB — TSH: TSH: 2.13 u[IU]/mL (ref 0.35–5.50)

## 2023-06-25 LAB — HEMOGLOBIN A1C: Hgb A1c MFr Bld: 7.6 % — ABNORMAL HIGH (ref 4.6–6.5)

## 2023-06-25 LAB — VITAMIN B12: Vitamin B-12: >1501 pg/mL — ABNORMAL HIGH (ref 211–911)

## 2023-06-25 NOTE — Assessment & Plan Note (Signed)
Check CBC 

## 2023-06-25 NOTE — Progress Notes (Signed)
Subjective:  Patient ID: Beth Miller, female    DOB: [birth year]  Age: 82 y.o. MRN: [id number]  CC: Follow-up (Pt states she has concerns with swelling in her rt leg, fatigue and weight gain.)   HPI Beth Miller presents for fatigue, wt gain, RLE swelling  Outpatient Medications Prior to Visit  Medication Sig Dispense Refill   Accu-Chek Softclix Lancets lancets 1 each by Other route 2 (two) times daily. Use to check blood sugars twice a day 200 each 3   acetaminophen (TYLENOL) 500 MG tablet Take 1,000 mg by mouth every 6 (six) hours as needed for mild pain.     Alcohol Swabs (B-D SINGLE USE SWABS REGULAR) PADS 1 each by Does not apply route 2 (two) times daily. Use to clean finger to check blood sugars twice a day 200 each 3   Cholecalciferol (VITAMIN D3) 1000 UNIT tablet Take 1,000 Units by mouth daily.     colchicine 0.6 MG tablet Take two tablets prn gout attack.Then take another one in 1-2 hrs. Do not repeat for 3 days. 18 tablet 1   cyclobenzaprine (FLEXERIL) 10 MG tablet take 1/2 to 1 TABLET BY MOUTH THREE TIMES DAILY AS NEEDED FOR MUSCLE SPASMS 90 tablet 1   diltiazem (CARDIZEM CD) 240 MG 24 hr capsule TAKE 1 CAPSULE EVERY DAY 90 capsule 1   famotidine (PEPCID) 40 MG tablet Take 1 tablet (40 mg total) by mouth at bedtime. 90 tablet 3   furosemide (LASIX) 40 MG tablet Take 1 tablet (40 mg total) by mouth daily. 90 tablet 3   gabapentin (NEURONTIN) 100 MG capsule Take 1 capsule (100 mg total) by mouth at bedtime. 90 capsule 3   glucose blood (ACCU-CHEK AVIVA PLUS) test strip TEST BLOOD SUGAR TWO TIMES DAILY 200 strip 3   JARDIANCE 10 MG TABS tablet Take 10 mg by mouth daily.     Lancet Devices (ACCU-CHEK SOFTCLIX) lancets 1 each by Other route 2 (two) times daily. Use as instructed 1 each 5   loratadine (ALLERGY RELIEF) 10 MG tablet TAKE ONE TABLET BY MOUTH EVERY DAY 30 tablet 11   memantine (NAMENDA) 5 MG tablet TAKE 1 TABLET TWICE DAILY 180 tablet 3   metoprolol succinate  (TOPROL-XL) 50 MG 24 hr tablet TAKE 1/2 TABLET EVERY DAY 45 tablet 1   mirabegron ER (MYRBETRIQ) 25 MG TB24 tablet Take 1 tablet (25 mg total) by mouth daily. 90 tablet 3   pantoprazole (PROTONIX) 40 MG tablet TAKE 1 TABLET EVERY MORNING 90 tablet 3   vitamin B-12 (CYANOCOBALAMIN) 1000 MCG tablet Take 1 tablet (1,000 mcg total) by mouth daily. 90 tablet 3   warfarin (COUMADIN) 10 MG tablet TAKE 1 TABLET DAILY OR AS DIRECTED BY COUMADIN CLINIC 90 tablet 3   warfarin (COUMADIN) 5 MG tablet TAKE 1 TABLET TWICE WEEKLY ALONG WITH 10MG  DOSE OR AS ADVISED BY COUMADIN CLINIC 90 tablet 1   potassium chloride (KLOR-CON M) 10 MEQ tablet Take 1 tablet (10 mEq total) by mouth 2 (two) times daily. 180 tablet 3   No facility-administered medications prior to visit.    ROS: Review of Systems  Constitutional:  Positive for fatigue. Negative for activity change, appetite change, chills and unexpected weight change.  HENT:  Negative for congestion, mouth sores and sinus pressure.   Eyes:  Negative for visual disturbance.  Respiratory:  Negative for cough and chest tightness.   Gastrointestinal:  Negative for abdominal pain and nausea.  Genitourinary:  Negative for difficulty urinating,  frequency and vaginal pain.  Musculoskeletal:  Positive for arthralgias. Negative for back pain and gait problem.  Skin:  Negative for pallor and rash.  Neurological:  Negative for dizziness, tremors, weakness, numbness and headaches.  Psychiatric/Behavioral:  Negative for confusion and sleep disturbance.     Objective:  BP 110/60 (BP Location: Left Arm, Patient Position: Sitting, Cuff Size: Normal)   Pulse 91   Temp 97.8 F (36.6 C) (Oral)   Ht 5\' 2"  (1.575 m)   Wt 189 lb (85.7 kg)   LMP  (LMP Unknown)   SpO2 92%   BMI 34.57 kg/m   BP Readings from Last 3 Encounters:  06/25/23 110/60  06/09/23 109/71  06/05/23 (!) 120/58    Wt Readings from Last 3 Encounters:  06/25/23 189 lb (85.7 kg)  06/09/23 186 lb (84.4  kg)  06/05/23 189 lb (85.7 kg)    Physical Exam Constitutional:      General: She is not in acute distress.    Appearance: She is well-developed. She is obese.  HENT:     Head: Normocephalic.     Right Ear: External ear normal.     Left Ear: External ear normal.     Nose: Nose normal.  Eyes:     General:        Right eye: No discharge.        Left eye: No discharge.     Conjunctiva/sclera: Conjunctivae normal.     Pupils: Pupils are equal, round, and reactive to light.  Neck:     Thyroid: No thyromegaly.     Vascular: No JVD.     Trachea: No tracheal deviation.  Cardiovascular:     Rate and Rhythm: Normal rate and regular rhythm.     Heart sounds: Normal heart sounds.  Pulmonary:     Effort: No respiratory distress.     Breath sounds: No stridor. No wheezing.  Abdominal:     General: Bowel sounds are normal. There is no distension.     Palpations: Abdomen is soft. There is no mass.     Tenderness: There is no abdominal tenderness. There is no guarding or rebound.  Musculoskeletal:        General: No tenderness.     Cervical back: Normal range of motion and neck supple. No rigidity.  Lymphadenopathy:     Cervical: No cervical adenopathy.  Skin:    Findings: No erythema or rash.  Neurological:     Cranial Nerves: No cranial nerve deficit.     Motor: No abnormal muscle tone.     Coordination: Coordination normal.     Deep Tendon Reflexes: Reflexes normal.  Psychiatric:        Behavior: Behavior normal.        Thought Content: Thought content normal.        Judgment: Judgment normal.     Lab Results  Component Value Date   WBC 5.0 06/25/2023   HGB 11.6 (L) 06/25/2023   HCT 36.5 06/25/2023   PLT 231.0 06/25/2023   GLUCOSE 131 (H) 06/25/2023   CHOL 116 01/22/2022   TRIG 92 01/22/2022   HDL 47 01/22/2022   LDLCALC 51 01/22/2022   ALT 13 06/25/2023   AST 23 06/25/2023   NA 140 06/25/2023   K 3.9 06/25/2023   CL 100 06/25/2023   CREATININE 1.17 06/25/2023    BUN 17 06/25/2023   CO2 32 06/25/2023   TSH 2.13 06/25/2023   INR 3.5 (A) 07/01/2023   HGBA1C  7.6 (H) 06/25/2023    No results found.  Assessment & Plan:   Problem List Items Addressed This Visit     Diabetes mellitus type 2, controlled, with complications (HCC)    Re-start Prandin tid ac  Off Metformin Freestyle Libre - pt never got it (Not covered). Using Accucheck Aviva plusCheck A1c      Relevant Orders   Comprehensive metabolic panel (Completed)   Hemoglobin A1c (Completed)   TSH (Completed)   T4, free (Completed)   Vitamin B12 (Completed)   Urinalysis   CBC with Differential/Platelet (Completed)   Iron, TIBC and Ferritin Panel (Completed)   Anticoagulant long-term use    On Coumadin      Anemia    Check CBC      Relevant Orders   CBC with Differential/Platelet (Completed)   Iron, TIBC and Ferritin Panel (Completed)   Fatigue   Relevant Orders   Comprehensive metabolic panel (Completed)   Hemoglobin A1c (Completed)   TSH (Completed)   T4, free (Completed)   Vitamin B12 (Completed)   Urinalysis   CBC with Differential/Platelet (Completed)   Iron, TIBC and Ferritin Panel (Completed)   Paresthesia   Relevant Orders   Vitamin B12 (Completed)   Atrial fibrillation with rapid ventricular response (HCC) - Primary    On Coumadin      Stage 3a chronic kidney disease (HCC)    Monitor GFR Hydrate better      Relevant Orders   Comprehensive metabolic panel (Completed)   Hemoglobin A1c (Completed)   TSH (Completed)   T4, free (Completed)   Vitamin B12 (Completed)   Urinalysis   CBC with Differential/Platelet (Completed)   Iron, TIBC and Ferritin Panel (Completed)      No orders of the defined types were placed in this encounter.     Follow-up: Return in about 4 weeks (around 07/23/2023) for a follow-up visit.  Sonda Primes, MD

## 2023-06-25 NOTE — Assessment & Plan Note (Signed)
On Coumadin 

## 2023-06-25 NOTE — Assessment & Plan Note (Addendum)
Re-start Prandin tid ac  Off Metformin Freestyle Libre - pt never got it (Not covered). Using Accucheck Aviva plusCheck A1c

## 2023-06-25 NOTE — Assessment & Plan Note (Addendum)
Monitor GFR Hydrate better 

## 2023-06-26 ENCOUNTER — Other Ambulatory Visit: Payer: Self-pay | Admitting: Internal Medicine

## 2023-06-26 LAB — IRON,TIBC AND FERRITIN PANEL
%SAT: 13 % — ABNORMAL LOW (ref 16–45)
Ferritin: 21 ng/mL (ref 16–288)
Iron: 52 ug/dL (ref 45–160)
TIBC: 393 mcg/dL (calc) (ref 250–450)

## 2023-06-26 MED ORDER — NITROFURANTOIN MONOHYD MACRO 100 MG PO CAPS: 100.0000 mg | ORAL_CAPSULE | Freq: Two times a day (BID) | ORAL | 0 refills | Status: AC

## 2023-07-01 ENCOUNTER — Ambulatory Visit: Payer: Medicare HMO | Attending: Cardiovascular Disease

## 2023-07-01 DIAGNOSIS — Z7901 Long term (current) use of anticoagulants: Secondary | ICD-10-CM | POA: Diagnosis not present

## 2023-07-01 DIAGNOSIS — Z9889 Other specified postprocedural states: Secondary | ICD-10-CM | POA: Diagnosis not present

## 2023-07-01 LAB — POCT INR: INR: 3.5 — AB (ref 2.0–3.0)

## 2023-07-01 NOTE — Patient Instructions (Signed)
continue taking Warfarin 10mg  (white tablet) daily.  Recheck INR in 6 weeks. Stay consistent with greens 1-2 servings a week of greens.  [facility] [phone]

## 2023-07-02 ENCOUNTER — Other Ambulatory Visit: Payer: Self-pay | Admitting: Internal Medicine

## 2023-07-03 ENCOUNTER — Encounter: Payer: Self-pay | Admitting: Internal Medicine

## 2023-07-03 NOTE — Assessment & Plan Note (Signed)
Cont on Cartia, Coumadin, Toprol

## 2023-07-03 NOTE — Assessment & Plan Note (Signed)
Hydrate well 

## 2023-07-03 NOTE — Assessment & Plan Note (Signed)
Cont w/Vit D 

## 2023-07-17 DIAGNOSIS — E1165 Type 2 diabetes mellitus with hyperglycemia: Secondary | ICD-10-CM | POA: Diagnosis not present

## 2023-07-22 ENCOUNTER — Ambulatory Visit (INDEPENDENT_AMBULATORY_CARE_PROVIDER_SITE_OTHER): Payer: Medicare HMO

## 2023-07-22 ENCOUNTER — Ambulatory Visit (INDEPENDENT_AMBULATORY_CARE_PROVIDER_SITE_OTHER): Payer: Medicare HMO | Admitting: Internal Medicine

## 2023-07-22 ENCOUNTER — Encounter: Payer: Self-pay | Admitting: Internal Medicine

## 2023-07-22 VITALS — BP 110/60 | HR 65 | Temp 98.1°F | Ht 62.0 in | Wt 185.0 lb

## 2023-07-22 DIAGNOSIS — R059 Cough, unspecified: Secondary | ICD-10-CM | POA: Diagnosis not present

## 2023-07-22 DIAGNOSIS — I4821 Permanent atrial fibrillation: Secondary | ICD-10-CM | POA: Diagnosis not present

## 2023-07-22 DIAGNOSIS — N1831 Chronic kidney disease, stage 3a: Secondary | ICD-10-CM | POA: Diagnosis not present

## 2023-07-22 DIAGNOSIS — J301 Allergic rhinitis due to pollen: Secondary | ICD-10-CM

## 2023-07-22 DIAGNOSIS — R052 Subacute cough: Secondary | ICD-10-CM | POA: Diagnosis not present

## 2023-07-22 DIAGNOSIS — I517 Cardiomegaly: Secondary | ICD-10-CM | POA: Diagnosis not present

## 2023-07-22 MED ORDER — LORATADINE 10 MG PO TABS: 10.0000 mg | ORAL_TABLET | Freq: Every day | ORAL | 3 refills | Status: AC

## 2023-07-22 MED ORDER — PROMETHAZINE-DM 6.25-15 MG/5ML PO SYRP: 5.0000 mL | ORAL_SOLUTION | Freq: Four times a day (QID) | ORAL | 0 refills | Status: DC | PRN

## 2023-07-22 NOTE — Progress Notes (Signed)
Subjective:  Patient ID: Beth Miller, female    DOB: Feb 20, 1941  Age: 82 y.o. MRN: [id number]  CC: Follow-up (4 week f/u)   HPI Beth Miller presents for R leg edema - better C/o dry cough - worse at night x 1 month F/u HTN, A fib and GERD  Outpatient Medications Prior to Visit  Medication Sig Dispense Refill   Accu-Chek Softclix Lancets lancets 1 each by Other route 2 (two) times daily. Use to check blood sugars twice a day 200 each 3   acetaminophen (TYLENOL) 500 MG tablet Take 1,000 mg by mouth every 6 (six) hours as needed for mild pain.     Alcohol Swabs (B-D SINGLE USE SWABS REGULAR) PADS 1 each by Does not apply route 2 (two) times daily. Use to clean finger to check blood sugars twice a day 200 each 3   Cholecalciferol (VITAMIN D3) 1000 UNIT tablet Take 1,000 Units by mouth daily.     colchicine 0.6 MG tablet Take two tablets prn gout attack.Then take another one in 1-2 hrs. Do not repeat for 3 days. 18 tablet 1   cyclobenzaprine (FLEXERIL) 10 MG tablet take 1/2 to 1 TABLET BY MOUTH THREE TIMES DAILY AS NEEDED FOR MUSCLE SPASMS 90 tablet 1   diltiazem (CARDIZEM CD) 240 MG 24 hr capsule TAKE 1 CAPSULE EVERY DAY 90 capsule 1   famotidine (PEPCID) 40 MG tablet Take 1 tablet (40 mg total) by mouth at bedtime. 90 tablet 3   furosemide (LASIX) 40 MG tablet Take 1 tablet (40 mg total) by mouth daily. 90 tablet 3   gabapentin (NEURONTIN) 100 MG capsule Take 1 capsule (100 mg total) by mouth at bedtime. 90 capsule 3   glucose blood (ACCU-CHEK AVIVA PLUS) test strip TEST BLOOD SUGAR TWO TIMES DAILY 200 strip 3   JARDIANCE 10 MG TABS tablet Take 10 mg by mouth daily.     Lancet Devices (ACCU-CHEK SOFTCLIX) lancets 1 each by Other route 2 (two) times daily. Use as instructed 1 each 5   memantine (NAMENDA) 5 MG tablet TAKE 1 TABLET TWICE DAILY 180 tablet 3   metoprolol succinate (TOPROL-XL) 50 MG 24 hr tablet TAKE 1/2 TABLET EVERY DAY 45 tablet 1   mirabegron ER (MYRBETRIQ) 25 MG  TB24 tablet Take 1 tablet (25 mg total) by mouth daily. 90 tablet 3   nitrofurantoin, macrocrystal-monohydrate, (MACROBID) 100 MG capsule Take 1 capsule (100 mg total) by mouth 2 (two) times daily. 14 capsule 0   pantoprazole (PROTONIX) 40 MG tablet TAKE 1 TABLET EVERY MORNING 90 tablet 3   potassium chloride (KLOR-CON M) 10 MEQ tablet TAKE 1 TABLET TWICE DAILY 180 tablet 3   vitamin B-12 (CYANOCOBALAMIN) 1000 MCG tablet Take 1 tablet (1,000 mcg total) by mouth daily. 90 tablet 3   warfarin (COUMADIN) 10 MG tablet TAKE 1 TABLET DAILY OR AS DIRECTED BY COUMADIN CLINIC 90 tablet 3   warfarin (COUMADIN) 5 MG tablet TAKE 1 TABLET TWICE WEEKLY ALONG WITH 10MG  DOSE OR AS ADVISED BY COUMADIN CLINIC 90 tablet 1   loratadine (ALLERGY RELIEF) 10 MG tablet TAKE ONE TABLET BY MOUTH EVERY DAY 30 tablet 11   No facility-administered medications prior to visit.    ROS: Review of Systems  Constitutional:  Negative for activity change, appetite change, chills, fatigue and unexpected weight change.  HENT:  Positive for congestion. Negative for mouth sores and sinus pressure.   Eyes:  Negative for visual disturbance.  Respiratory:  Positive for cough.  Negative for chest tightness and shortness of breath.   Gastrointestinal:  Negative for abdominal pain and nausea.  Genitourinary:  Negative for difficulty urinating, frequency and vaginal pain.  Musculoskeletal:  Negative for back pain and gait problem.  Skin:  Negative for pallor and rash.  Neurological:  Negative for dizziness, tremors, weakness, numbness and headaches.  Psychiatric/Behavioral:  Negative for confusion and sleep disturbance.     Objective:  BP 110/60 (BP Location: Left Arm, Patient Position: Sitting, Cuff Size: Normal)   Pulse 65   Temp 98.1 F (36.7 C) (Oral)   Ht 5\' 2"  (1.575 m)   Wt 185 lb (83.9 kg)   LMP  (LMP Unknown)   SpO2 97%   BMI 33.84 kg/m   BP Readings from Last 3 Encounters:  07/22/23 110/60  06/25/23 110/60   06/09/23 109/71    Wt Readings from Last 3 Encounters:  07/22/23 185 lb (83.9 kg)  06/25/23 189 lb (85.7 kg)  06/09/23 186 lb (84.4 kg)    Physical Exam Constitutional:      General: She is not in acute distress.    Appearance: Normal appearance. She is well-developed.  HENT:     Head: Normocephalic.     Right Ear: External ear normal.     Left Ear: External ear normal.     Nose: Nose normal.  Eyes:     General:        Right eye: No discharge.        Left eye: No discharge.     Conjunctiva/sclera: Conjunctivae normal.     Pupils: Pupils are equal, round, and reactive to light.  Neck:     Thyroid: No thyromegaly.     Vascular: No JVD.     Trachea: No tracheal deviation.  Cardiovascular:     Rate and Rhythm: Normal rate and regular rhythm.     Heart sounds: Normal heart sounds.  Pulmonary:     Effort: No respiratory distress.     Breath sounds: No stridor. No wheezing.  Abdominal:     General: Bowel sounds are normal. There is no distension.     Palpations: Abdomen is soft. There is no mass.     Tenderness: There is no abdominal tenderness. There is no guarding or rebound.  Musculoskeletal:        General: No tenderness.     Cervical back: Normal range of motion and neck supple. No rigidity.  Lymphadenopathy:     Cervical: No cervical adenopathy.  Skin:    Findings: No erythema or rash.  Neurological:     Cranial Nerves: No cranial nerve deficit.     Motor: No abnormal muscle tone.     Coordination: Coordination normal.     Deep Tendon Reflexes: Reflexes normal.  Psychiatric:        Behavior: Behavior normal.        Thought Content: Thought content normal.        Judgment: Judgment normal.     Lab Results  Component Value Date   WBC 5.0 06/25/2023   HGB 11.6 (L) 06/25/2023   HCT 36.5 06/25/2023   PLT 231.0 06/25/2023   GLUCOSE 131 (H) 06/25/2023   CHOL 116 01/22/2022   TRIG 92 01/22/2022   HDL 47 01/22/2022   LDLCALC 51 01/22/2022   ALT 13  06/25/2023   AST 23 06/25/2023   NA 140 06/25/2023   K 3.9 06/25/2023   CL 100 06/25/2023   CREATININE 1.17 06/25/2023   BUN 17  06/25/2023   CO2 32 06/25/2023   TSH 2.13 06/25/2023   INR 3.5 (A) 07/01/2023   HGBA1C 7.6 (H) 06/25/2023    No results found.  Assessment & Plan:   Problem List Items Addressed This Visit     Atrial fibrillation (HCC)    Cont on Cartia, Coumadin, Toprol      Allergic rhinitis    Treat allergies - Loratidine qd       Chronic kidney disease, stage 3a (HCC)    Hydrate well      Cough - Primary    Treat allergies Prom cough syr CXR      Relevant Orders   DG Chest 2 View      Meds ordered this encounter  Medications   loratadine (ALLERGY RELIEF) 10 MG tablet    Sig: Take 1 tablet (10 mg total) by mouth daily.    Dispense:  90 tablet    Refill:  3   promethazine-dextromethorphan (PROMETHAZINE-DM) 6.25-15 MG/5ML syrup    Sig: Take 5 mLs by mouth 4 (four) times daily as needed for cough.    Dispense:  240 mL    Refill:  0      Follow-up: Return in about 4 months (around 11/21/2023) for a follow-up visit.  Sonda Primes, MD

## 2023-07-22 NOTE — Assessment & Plan Note (Signed)
Hydrate well

## 2023-07-22 NOTE — Assessment & Plan Note (Signed)
Treat allergies - Loratidine qd

## 2023-07-22 NOTE — Assessment & Plan Note (Signed)
Cont on Cartia, Coumadin, Toprol

## 2023-07-22 NOTE — Assessment & Plan Note (Signed)
Treat allergies Prom cough syr CXR

## 2023-07-30 ENCOUNTER — Ambulatory Visit: Payer: Medicare HMO | Admitting: Cardiovascular Disease

## 2023-08-11 ENCOUNTER — Other Ambulatory Visit: Payer: Self-pay | Admitting: Internal Medicine

## 2023-08-12 ENCOUNTER — Ambulatory Visit: Payer: Medicare HMO | Attending: Cardiology

## 2023-08-12 DIAGNOSIS — Z7901 Long term (current) use of anticoagulants: Secondary | ICD-10-CM

## 2023-08-12 DIAGNOSIS — Z9889 Other specified postprocedural states: Secondary | ICD-10-CM

## 2023-08-12 LAB — POCT INR: INR: 3 (ref 2.0–3.0)

## 2023-08-12 NOTE — Patient Instructions (Signed)
continue taking Warfarin 10mg  (white tablet) daily.  Recheck INR in 6 weeks. Stay consistent with greens 1-2 servings a week of greens.  [facility] [phone]

## 2023-08-13 ENCOUNTER — Other Ambulatory Visit: Payer: Self-pay | Admitting: Cardiovascular Disease

## 2023-09-23 ENCOUNTER — Ambulatory Visit: Payer: Medicare HMO

## 2023-10-06 ENCOUNTER — Ambulatory Visit: Payer: Medicare HMO

## 2023-10-06 ENCOUNTER — Telehealth: Payer: Self-pay | Admitting: *Deleted

## 2023-10-06 NOTE — Telephone Encounter (Signed)
Called pt since she missed her Anticoagulation Appt today. There was no answer and voicemail full; was given then option to leave SMS text and left the main number. Pt needs her Anticoagulation Appt rescheduled.

## 2023-10-10 ENCOUNTER — Ambulatory Visit: Payer: Medicare HMO | Attending: Cardiology

## 2023-10-10 DIAGNOSIS — Z7901 Long term (current) use of anticoagulants: Secondary | ICD-10-CM | POA: Diagnosis not present

## 2023-10-10 DIAGNOSIS — Z9889 Other specified postprocedural states: Secondary | ICD-10-CM | POA: Diagnosis not present

## 2023-10-10 LAB — POCT INR: INR: 2.5 (ref 2.0–3.0)

## 2023-10-10 NOTE — Patient Instructions (Signed)
Description   Take 1.5 tablets today and then continue taking Warfarin 10mg  (white tablet) daily.  Recheck INR in 6 weeks. Stay consistent with greens 1-2 servings a week of greens.  [facility] [phone]

## 2023-11-03 ENCOUNTER — Telehealth: Payer: Self-pay

## 2023-11-03 NOTE — Telephone Encounter (Signed)
 Pt husband has stated pt is getting worse and more aggressive. Pt husband is asking for help with is wife's care daily at home.  Pt husband is concerns due to pt doing thing's out the ordinary like taking cans and stuff and putting them in their garden in the backyard, pt will make up the bed in a way the pt is unable to get in. Pt is also over talking or interrupting her husband to say she is in charge or make a point to do or say things instead of her husband.

## 2023-11-03 NOTE — Telephone Encounter (Signed)
 Copied from CRM [id number]. Topic: Clinical - Medical Advice >> Nov 03, 2023 12:14 PM Evie B wrote:  Reason for CRM: pt husband samuel called requesting to speak with the provider regarding pt health. States pt may have dementia. Needs some help with her. Can someone please give him a call [phone]

## 2023-11-04 NOTE — Telephone Encounter (Addendum)
 Noted.  Keep ROV, come together for an appointment if possible. Thx

## 2023-11-07 ENCOUNTER — Ambulatory Visit (INDEPENDENT_AMBULATORY_CARE_PROVIDER_SITE_OTHER): Payer: Medicare HMO

## 2023-11-07 VITALS — Ht 62.0 in | Wt 187.0 lb

## 2023-11-07 DIAGNOSIS — E118 Type 2 diabetes mellitus with unspecified complications: Secondary | ICD-10-CM

## 2023-11-07 DIAGNOSIS — Z0000 Encounter for general adult medical examination without abnormal findings: Secondary | ICD-10-CM

## 2023-11-07 DIAGNOSIS — Z780 Asymptomatic menopausal state: Secondary | ICD-10-CM

## 2023-11-07 DIAGNOSIS — Z7984 Long term (current) use of oral hypoglycemic drugs: Secondary | ICD-10-CM | POA: Diagnosis not present

## 2023-11-07 NOTE — Progress Notes (Signed)
 Subjective:   Beth Miller is a 83 y.o. female who presents for Medicare Annual (Subsequent) preventive examination.  Visit Complete: Virtual I connected with  Beth Miller on 11/07/23 by a audio enabled telemedicine application and verified that I am speaking with the correct person using two identifiers.  Patient Location: Home  Provider Location: Office/Clinic  I discussed the limitations of evaluation and management by telemedicine. The patient expressed understanding and agreed to proceed.  Vital Signs: Because this visit was a virtual/telehealth visit, some criteria may be missing or patient reported. Any vitals not documented were not able to be obtained and vitals that have been documented are patient reported.  Cardiac Risk Factors include: advanced age (>28men, >37 women);hypertension;Other (see comment);diabetes mellitus, Risk factor comments: A-Fib, OSA, CKD     Objective:    Today's Vitals   11/07/23 1054  Weight: 187 lb (84.8 kg)  Height: 5' 2 (1.575 m)   Body mass index is 34.2 kg/m.     11/07/2023   11:09 AM 12/19/2022    1:04 PM 10/09/2022    5:00 PM 12/18/2021   11:16 AM 02/06/2021   12:55 AM 06/18/2019   12:12 PM 02/26/2016    1:38 PM  Advanced Directives  Does Patient Have a Medical Advance Directive? No No No No No No No  Would patient like information on creating a medical advance directive? Yes (MAU/Ambulatory/Procedural Areas - Information given) No - Patient declined  No - Patient declined No - Patient declined  --    Current Medications (verified) Outpatient Encounter Medications as of 11/07/2023  Medication Sig   Accu-Chek Softclix Lancets lancets 1 each by Other route 2 (two) times daily. Use to check blood sugars twice a day   acetaminophen  (TYLENOL ) 500 MG tablet Take 1,000 mg by mouth every 6 (six) hours as needed for mild pain.   Alcohol Swabs (B-D SINGLE USE SWABS REGULAR) PADS 1 each by Does not apply route 2 (two) times daily. Use to  clean finger to check blood sugars twice a day   Cholecalciferol (VITAMIN D3) 1000 UNIT tablet Take 1,000 Units by mouth daily.   colchicine  0.6 MG tablet Take two tablets prn gout attack.Then take another one in 1-2 hrs. Do not repeat for 3 days.   cyclobenzaprine  (FLEXERIL ) 10 MG tablet take 1/2 to 1 TABLET BY MOUTH THREE TIMES DAILY AS NEEDED FOR MUSCLE SPASMS   diltiazem  (CARDIZEM  CD) 240 MG 24 hr capsule TAKE 1 CAPSULE EVERY DAY   famotidine  (PEPCID ) 40 MG tablet Take 1 tablet (40 mg total) by mouth at bedtime.   glucose blood (ACCU-CHEK AVIVA PLUS) test strip TEST BLOOD SUGAR TWO TIMES DAILY   JARDIANCE  10 MG TABS tablet Take 10 mg by mouth daily.   Lancet Devices (ACCU-CHEK SOFTCLIX) lancets 1 each by Other route 2 (two) times daily. Use as instructed   memantine  (NAMENDA ) 5 MG tablet TAKE ONE TABLET BY MOUTH BY MOUTH TWICE DAILY   metoprolol  succinate (TOPROL -XL) 50 MG 24 hr tablet TAKE 1/2 TABLET EVERY DAY   pantoprazole  (PROTONIX ) 40 MG tablet TAKE 1 TABLET EVERY MORNING   potassium chloride  (KLOR-CON  M) 10 MEQ tablet TAKE 1 TABLET TWICE DAILY   promethazine -dextromethorphan (PROMETHAZINE -DM) 6.25-15 MG/5ML syrup Take 5 mLs by mouth 4 (four) times daily as needed for cough.   vitamin B-12 (CYANOCOBALAMIN ) 1000 MCG tablet Take 1 tablet (1,000 mcg total) by mouth daily.   warfarin (COUMADIN ) 10 MG tablet TAKE 1 TABLET DAILY OR AS DIRECTED  BY COUMADIN  CLINIC   warfarin (COUMADIN ) 5 MG tablet TAKE 1 TABLET TWICE WEEKLY ALONG WITH 10MG  DOSE OR AS ADVISED BY COUMADIN  CLINIC   furosemide  (LASIX ) 40 MG tablet Take 1 tablet (40 mg total) by mouth daily.   gabapentin  (NEURONTIN ) 100 MG capsule TAKE ONE CAPSULE BY MOUTH AT BEDTIME (Patient not taking: Reported on 11/07/2023)   loratadine  (ALLERGY RELIEF) 10 MG tablet Take 1 tablet (10 mg total) by mouth daily. (Patient not taking: Reported on 11/07/2023)   mirabegron  ER (MYRBETRIQ ) 25 MG TB24 tablet Take 1 tablet (25 mg total) by mouth daily.  (Patient not taking: Reported on 11/07/2023)   nitrofurantoin , macrocrystal-monohydrate, (MACROBID ) 100 MG capsule Take 1 capsule (100 mg total) by mouth 2 (two) times daily. (Patient not taking: Reported on 11/07/2023)   No facility-administered encounter medications on file as of 11/07/2023.    Allergies (verified) Aricept  [donepezil  hcl], Food, Furosemide , Metformin  and related, Quinine, Spironolactone , Tramadol , and Sulfadiazine   History: Past Medical History:  Diagnosis Date   AF (atrial fibrillation) (HCC)    ALLERGIC RHINITIS    Blood transfusion without reported diagnosis    Cataract, senile    Chest pain    CHF (congestive heart failure) (HCC)    Chronic anticoagulation    Diabetes mellitus    Diabetes mellitus    Disorder of oral soft tissue    of mouth   Diverticulosis    GERD (gastroesophageal reflux disease)    Hematoma    Hypertension    Leg pain    Low back pain    Paresthesia    Rheumatic fever    Status post mitral valve replacement    St. Jude valve   Stroke (HCC)    tia   Sweating    Vitamin D  deficiency    Past Surgical History:  Procedure Laterality Date   ABDOMINAL HYSTERECTOMY     CARDIAC VALVE REPLACEMENT  1995   Mitral valve prosthesis; st jude   CHOLECYSTECTOMY     Family History  Problem Relation Age of Onset   Diabetes Mother    Seizures Mother    Other Mother        brain tumor   Heart disease Brother    Diabetes Brother    Heart disease Brother    Pancreatic cancer Brother    Colon cancer Neg Hx    Social History   Socioeconomic History   Marital status: Married    Spouse name: Samual   Number of children: 1   Years of education: Not on file   Highest education level: Not on file  Occupational History   Occupation: Retired    Associate Professor: RETIRED  Tobacco Use   Smoking status: Former    Current packs/day: 0.00    Average packs/day: (1.2 ttl pk-yrs)    Types: Cigarettes    Start date: 10/28/1942    Quit date: 10/28/2002     Years since quitting: 21.0    Passive exposure: Never   Smokeless tobacco: Never   Tobacco comments:    quit x 13 years   Vaping Use   Vaping status: Never Used  Substance and Sexual Activity   Alcohol use: No    Alcohol/week: 0.0 standard drinks of alcohol   Drug use: No   Sexual activity: Yes    Partners: Male  Other Topics Concern   Not on file  Social History Narrative   Negative Family History of Colon Cancer      Regular exercise-yes, bowling  Daily caffeine Use-rare      Lives with husbands-2025   Social Drivers of Health   Financial Resource Strain: Low Risk  (11/07/2023)   Overall Financial Resource Strain (CARDIA)    Difficulty of Paying Living Expenses: Not very hard  Food Insecurity: No Food Insecurity (11/07/2023)   Hunger Vital Sign    Worried About Running Out of Food in the Last Year: Never true    Ran Out of Food in the Last Year: Never true  Transportation Needs: No Transportation Needs (11/07/2023)   PRAPARE - Administrator, Civil Service (Medical): No    Lack of Transportation (Non-Medical): No  Physical Activity: Inactive (11/07/2023)   Exercise Vital Sign    Days of Exercise per Week: 0 days    Minutes of Exercise per Session: 0 min  Stress: No Stress Concern Present (11/07/2023)   [employer] of Occupational Health - Occupational Stress Questionnaire    Feeling of Stress : Not at all  Social Connections: Socially Integrated (11/07/2023)   Social Connection and Isolation Panel [NHANES]    Frequency of Communication with Friends and Family: More than three times a week    Frequency of Social Gatherings with Friends and Family: Three times a week    Attends Religious Services: More than 4 times per year    Active Member of Clubs or Organizations: Yes    Attends Banker Meetings: Never    Marital Status: Married    Tobacco Counseling Counseling given: Not Answered Tobacco comments: quit x 13 years    Clinical  Intake:  Pre-visit preparation completed: Yes  Pain : No/denies pain     BMI - recorded: 34.2 Nutritional Status: BMI > 30  Obese Nutritional Risks: None Diabetes: Yes CBG done?: No Did pt. bring in CBG monitor from home?: No  How often do you need to have someone help you when you read instructions, pamphlets, or other written materials from your doctor or pharmacy?: 1 - Never  Interpreter Needed?: No  Information entered by :: Marion Seese, RMA   Activities of Daily Living    11/07/2023   10:55 AM 12/19/2022    1:06 PM  In your present state of health, do you have any difficulty performing the following activities:  Hearing? 1 1  Comment hearing loss in lt ear   Vision? 0 0  Difficulty concentrating or making decisions? 0 0  Walking or climbing stairs? 0 0  Dressing or bathing? 0 0  Doing errands, shopping? 0 0  Preparing Food and eating ? N N  Using the Toilet? N N  In the past six months, have you accidently leaked urine? Y Y  Do you have problems with loss of bowel control? Y N  Managing your Medications? N N  Managing your Finances? N N  Housekeeping or managing your Housekeeping? N N    Patient Care Team: Plotnikov, Karlynn GAILS, MD as PCP - General Delford Maude BROCKS, MD as PCP - Cardiology (Cardiology) Delford Maude BROCKS, MD (Cardiology) Layman Aida LABOR, MD (Obstetrics and Gynecology) Nche, Roselie Rockford, NP as Nurse Practitioner (Internal Medicine) Eda Iha, MD (Inactive) as Consulting Physician (Gastroenterology) Szabat, Toribio BROCKS, [facility] (Inactive) as Pharmacist (Pharmacist) Camillo Golas, MD as Attending Physician (Ophthalmology)  Indicate any recent Medical Services you may have received from other than [facility] providers in the past year (date may be approximate).     Assessment:   This is a routine wellness examination for Rose-Marie.  Hearing/Vision  screen Hearing Screening - Comments:: Lt ear some hearing loss Vision Screening - Comments:: Wears  eyeglasses   Goals Addressed               This Visit's Progress     Patient Stated (pt-stated)        Would like to lose  weight       Depression Screen    11/07/2023   11:14 AM 07/22/2023   10:13 AM 06/25/2023   11:04 AM 12/19/2022    1:20 PM 10/25/2022   10:36 AM 12/18/2021   11:17 AM 08/03/2020    3:42 PM  PHQ 2/9 Scores  PHQ - 2 Score 0 0 0 0 0 0 0  PHQ- 9 Score 2   6 6       Fall Risk    11/07/2023   11:09 AM 07/22/2023   10:13 AM 06/25/2023   11:04 AM 12/19/2022    1:04 PM 10/25/2022   10:36 AM  Fall Risk   Falls in the past year? 0 1 1 0 0  Number falls in past yr: 0 1 0 0 0  Injury with Fall? 0 1 1 0 0  Risk for fall due to : No Fall Risks Impaired balance/gait History of fall(s) No Fall Risks No Fall Risks  Follow up Falls prevention discussed;Falls evaluation completed Falls evaluation completed Falls evaluation completed Falls prevention discussed Falls evaluation completed    MEDICARE RISK AT HOME: Medicare Risk at Home Any stairs in or around the home?: Yes (few at entrance) If so, are there any without handrails?: Yes Home free of loose throw rugs in walkways, pet beds, electrical cords, etc?: Yes Adequate lighting in your home to reduce risk of falls?: Yes Life alert?: Yes Use of a cane, walker or w/c?: No Grab bars in the bathroom?: Yes Shower chair or bench in shower?: No Elevated toilet seat or a handicapped toilet?: No  TIMED UP AND GO:  Was the test performed?  No    Cognitive Function:        11/07/2023   11:10 AM 12/19/2022    1:06 PM  6CIT Screen  What Year? 0 points 0 points  What month? 0 points 0 points  What time? 0 points 0 points  Count back from 20 0 points 0 points  Months in reverse 0 points 0 points  Repeat phrase 4 points 0 points  Total Score 4 points 0 points    Immunizations Immunization History  Administered Date(s) Administered   Fluad Quad(high Dose 65+) 06/16/2019, 08/03/2020, 09/06/2020, 08/11/2021,  08/05/2022   H1N1 10/03/2008   Influenza Split 09/12/2011, 09/28/2012   Influenza Whole 10/04/2004, 10/14/2007, 09/04/2010   Influenza, High Dose Seasonal PF 08/07/2016, 09/11/2017, 07/31/2018   Influenza,inj,Quad PF,6+ Mos 07/20/2013, 11/11/2014, 07/19/2015   PFIZER(Purple Top)SARS-COV-2 Vaccination 12/21/2019, 01/11/2020, 08/08/2020, 03/28/2021   Pneumococcal Conjugate-13 04/28/2014   Pneumococcal Polysaccharide-23 10/03/2006   Tdap 05/08/2016    TDAP status: Up to date  Flu Vaccine status: Due, Education has been provided regarding the importance of this vaccine. Advised may receive this vaccine at local pharmacy or Health Dept. Aware to provide a copy of the vaccination record if obtained from local pharmacy or Health Dept. Verbalized acceptance and understanding.  Pneumococcal vaccine status: Up to date  Covid-19 vaccine status: Declined, Education has been provided regarding the importance of this vaccine but patient still declined. Advised may receive this vaccine at local pharmacy or Health Dept.or vaccine clinic. Aware to provide a copy of  the vaccination record if obtained from local pharmacy or Health Dept. Verbalized acceptance and understanding.  Qualifies for Shingles Vaccine? Yes   Zostavax completed No   Shingrix Completed?: No.    Education has been provided regarding the importance of this vaccine. Patient has been advised to call insurance company to determine out of pocket expense if they have not yet received this vaccine. Advised may also receive vaccine at local pharmacy or Health Dept. Verbalized acceptance and understanding.  Screening Tests Health Maintenance  Topic Date Due   Diabetic kidney evaluation - Urine ACR  Never done   DEXA SCAN  Never done   OPHTHALMOLOGY EXAM  08/01/2021   FOOT EXAM  11/14/2023 (Originally 08/09/1951)   INFLUENZA VACCINE  01/26/2024 (Originally 05/29/2023)   HEMOGLOBIN A1C  12/26/2023   Diabetic kidney evaluation - eGFR measurement   06/24/2024   Medicare Annual Wellness (AWV)  11/06/2024   DTaP/Tdap/Td (2 - Td or Tdap) 05/08/2026   Pneumonia Vaccine 16+ Years old  Completed   HPV VACCINES  Aged Out   COVID-19 Vaccine  Discontinued   Zoster Vaccines- Shingrix  Discontinued    Health Maintenance  Health Maintenance Due  Topic Date Due   Diabetic kidney evaluation - Urine ACR  Never done   DEXA SCAN  Never done   OPHTHALMOLOGY EXAM  08/01/2021    Colorectal cancer screening: No longer required.   Mammogram status: No longer required due to age.  Bone Density status: Ordered 11/07/2023. Pt provided with contact info and advised to call to schedule appt.  Lung Cancer Screening: (Low Dose CT Chest recommended if Age 33-80 years, 20 pack-year currently smoking OR have quit w/in 15years.) does not qualify.   Lung Cancer Screening Referral: N/A  Additional Screening:  Hepatitis C Screening: does not qualify;  Vision Screening: Recommended annual ophthalmology exams for early detection of glaucoma and other disorders of the eye. Is the patient up to date with their annual eye exam?  Yes  Who is the provider or what is the name of the office in which the patient attends annual eye exams? Dr. Camillo If pt is not established with a provider, would they like to be referred to a provider to establish care? No .   Dental Screening: Recommended annual dental exams for proper oral hygiene  Diabetic Foot Exam: Diabetic Foot Exam: Overdue, Pt has been advised about the importance in completing this exam. Pt is scheduled for diabetic foot exam on 11/10/2023.  Community Resource Referral / Chronic Care Management: CRR required this visit?  No   CCM required this visit?  No     Plan:     I have personally reviewed and noted the following in the patient's chart:   Medical and social history Use of alcohol, tobacco or illicit drugs  Current medications and supplements including opioid prescriptions. Patient is not  currently taking opioid prescriptions. Functional ability and status Nutritional status Physical activity Advanced directives List of other physicians Hospitalizations, surgeries, and ER visits in previous 12 months Vitals Screenings to include cognitive, depression, and falls Referrals and appointments  In addition, I have reviewed and discussed with patient certain preventive protocols, quality metrics, and best practice recommendations. A written personalized care plan for preventive services as well as general preventive health recommendations were provided to patient.     Yareth Kearse L Dashonda Bonneau, CMA   11/07/2023   After Visit Summary: (MyChart) Due to this being a telephonic visit, the after visit summary with patients personalized  plan was offered to patient via MyChart   Nurse Notes: Patient is due for a Flu vaccine, however she stated that she has had one this season.  It has not been documented in the [facility].  Patient also stated that she has had an recent eye exam, which I have requested records today.  She is also due for a DEXA and a UACR, which orders have been placed.  Patient had no concerns to address today.

## 2023-11-07 NOTE — Patient Instructions (Signed)
 [name] , Thank you for taking time to come for your Medicare Wellness Visit. I appreciate your ongoing commitment to your health goals. Please review the following plan we discussed and let me know if I can assist you in the future.   Referrals/Orders/Follow-Ups/Clinician Recommendations: You are due for a Flu vaccine as I do not see any documentation for it being given.  You have an order for:  [x]   Bone Density     Please call for appointment:  [facility] [address] [place], [place] [address] [phone]    Make sure to wear two-piece clothing.  No lotions, powders, or deodorants the day of the appointment. Make sure to bring picture ID and insurance card.  Bring list of medications you are currently taking including any supplements.   Schedule your [place] screening mammogram through MyChart!   Log into your MyChart account.  Go to 'Visit' (or 'Appointments' if on mobile App) --> Schedule an Appointment  Under 'Select a Reason for Visit' choose the Mammogram Screening option.  Complete the pre-visit questions and select the time and place that best fits your schedule.    This is a list of the screening recommended for you and due dates:  Health Maintenance  Topic Date Due   Yearly kidney health urinalysis for diabetes  Never done   DEXA scan (bone density measurement)  Never done   Eye exam for diabetics  08/01/2021   Complete foot exam   11/14/2023*   Flu Shot  01/26/2024*   Hemoglobin A1C  12/26/2023   Yearly kidney function blood test for diabetes  06/24/2024   Medicare Annual Wellness Visit  11/06/2024   DTaP/Tdap/Td vaccine (2 - Td or Tdap) 05/08/2026   Pneumonia Vaccine  Completed   HPV Vaccine  Aged Out   COVID-19 Vaccine  Discontinued   Zoster (Shingles) Vaccine  Discontinued  *Topic was postponed. The date shown is not the original due date.    Advanced directives: (Provided) Advance directive discussed with you  today. I have provided a copy for you to complete at home and have notarized. Once this is complete, please bring a copy in to our office so we can scan it into your chart.   Next Medicare Annual Wellness Visit scheduled for next year: Yes

## 2023-11-10 ENCOUNTER — Ambulatory Visit: Payer: Medicare HMO | Admitting: Internal Medicine

## 2023-11-21 ENCOUNTER — Ambulatory Visit: Payer: Medicare HMO

## 2023-11-28 ENCOUNTER — Ambulatory Visit: Payer: Medicare HMO | Attending: Cardiovascular Disease

## 2023-11-28 DIAGNOSIS — Z7901 Long term (current) use of anticoagulants: Secondary | ICD-10-CM

## 2023-11-28 DIAGNOSIS — Z9889 Other specified postprocedural states: Secondary | ICD-10-CM | POA: Diagnosis not present

## 2023-11-28 LAB — POCT INR: INR: 2.7 (ref 2.0–3.0)

## 2023-11-28 NOTE — Patient Instructions (Signed)
continue taking Warfarin 10mg  (white tablet) daily.  Recheck INR in 6 weeks. Stay consistent with greens 1-2 servings a week of greens.  [facility] [phone]

## 2023-12-15 ENCOUNTER — Ambulatory Visit: Payer: Medicare Other | Admitting: Cardiovascular Disease

## 2024-01-06 ENCOUNTER — Ambulatory Visit: Payer: Medicare HMO | Attending: Cardiovascular Disease

## 2024-01-06 DIAGNOSIS — Z9889 Other specified postprocedural states: Secondary | ICD-10-CM

## 2024-01-06 DIAGNOSIS — Z7901 Long term (current) use of anticoagulants: Secondary | ICD-10-CM | POA: Diagnosis not present

## 2024-01-06 LAB — POCT INR: INR: 2.4 (ref 2.0–3.0)

## 2024-01-06 NOTE — Patient Instructions (Signed)
 Take 1.5 tablets today only then continue taking Warfarin 10mg  (white tablet) daily.  Recheck INR in 6 weeks. Stay consistent with greens 1-2 servings a week of greens.  C[facility]in Clinic [phone]

## 2024-02-13 ENCOUNTER — Other Ambulatory Visit: Payer: Self-pay | Admitting: Internal Medicine

## 2024-02-13 ENCOUNTER — Other Ambulatory Visit: Payer: Self-pay | Admitting: Cardiovascular Disease

## 2024-02-17 ENCOUNTER — Ambulatory Visit: Attending: Cardiology

## 2024-02-22 ENCOUNTER — Other Ambulatory Visit: Payer: Self-pay | Admitting: Cardiovascular Disease

## 2024-02-22 DIAGNOSIS — I4821 Permanent atrial fibrillation: Secondary | ICD-10-CM

## 2024-02-23 NOTE — Telephone Encounter (Addendum)
 Warfarin 10mg  refill MITRAL VALVE REPLACEMENT  Last INR 01/06/24 Last OV 06/05/23  PT NEEDS AN APPT/OVERDUE/WILL SEND ONCE APPT SET  Called pt but had to leave a message for her to call back. Will await   4/28 at 402pm made an appt for pt, 90 day sent to mail order

## 2024-03-01 ENCOUNTER — Encounter: Payer: Self-pay | Admitting: Internal Medicine

## 2024-03-01 ENCOUNTER — Ambulatory Visit (INDEPENDENT_AMBULATORY_CARE_PROVIDER_SITE_OTHER): Admitting: Internal Medicine

## 2024-03-01 VITALS — BP 110/52 | HR 93 | Temp 99.4°F | Ht 62.0 in | Wt 197.0 lb

## 2024-03-01 DIAGNOSIS — I5032 Chronic diastolic (congestive) heart failure: Secondary | ICD-10-CM

## 2024-03-01 DIAGNOSIS — R202 Paresthesia of skin: Secondary | ICD-10-CM | POA: Diagnosis not present

## 2024-03-01 DIAGNOSIS — N1831 Chronic kidney disease, stage 3a: Secondary | ICD-10-CM

## 2024-03-01 DIAGNOSIS — E118 Type 2 diabetes mellitus with unspecified complications: Secondary | ICD-10-CM

## 2024-03-01 DIAGNOSIS — I4891 Unspecified atrial fibrillation: Secondary | ICD-10-CM

## 2024-03-01 DIAGNOSIS — D638 Anemia in other chronic diseases classified elsewhere: Secondary | ICD-10-CM | POA: Diagnosis not present

## 2024-03-01 DIAGNOSIS — K219 Gastro-esophageal reflux disease without esophagitis: Secondary | ICD-10-CM

## 2024-03-01 DIAGNOSIS — E559 Vitamin D deficiency, unspecified: Secondary | ICD-10-CM | POA: Diagnosis not present

## 2024-03-01 DIAGNOSIS — Z952 Presence of prosthetic heart valve: Secondary | ICD-10-CM | POA: Insufficient documentation

## 2024-03-01 DIAGNOSIS — I872 Venous insufficiency (chronic) (peripheral): Secondary | ICD-10-CM

## 2024-03-01 LAB — CBC WITH DIFFERENTIAL/PLATELET
Basophils Absolute: 0 10*3/uL (ref 0.0–0.1)
Basophils Relative: 0.8 % (ref 0.0–3.0)
Eosinophils Absolute: 0.1 10*3/uL (ref 0.0–0.7)
Eosinophils Relative: 1.1 % (ref 0.0–5.0)
HCT: 33.6 % — ABNORMAL LOW (ref 36.0–46.0)
Hemoglobin: 11 g/dL — ABNORMAL LOW (ref 12.0–15.0)
Lymphocytes Relative: 22.4 % (ref 12.0–46.0)
Lymphs Abs: 1.1 10*3/uL (ref 0.7–4.0)
MCHC: 32.8 g/dL (ref 30.0–36.0)
MCV: 83.6 fl (ref 78.0–100.0)
Monocytes Absolute: 0.7 10*3/uL (ref 0.1–1.0)
Monocytes Relative: 14.9 % — ABNORMAL HIGH (ref 3.0–12.0)
Neutro Abs: 2.9 10*3/uL (ref 1.4–7.7)
Neutrophils Relative %: 60.8 % (ref 43.0–77.0)
Platelets: 191 10*3/uL (ref 150.0–400.0)
RBC: 4.02 Mil/uL (ref 3.87–5.11)
RDW: 16.6 % — ABNORMAL HIGH (ref 11.5–15.5)
WBC: 4.7 10*3/uL (ref 4.0–10.5)

## 2024-03-01 LAB — URINALYSIS, ROUTINE W REFLEX MICROSCOPIC
Bilirubin Urine: NEGATIVE
Ketones, ur: NEGATIVE
Leukocytes,Ua: NEGATIVE
Nitrite: NEGATIVE
Specific Gravity, Urine: 1.01 (ref 1.000–1.030)
Total Protein, Urine: NEGATIVE
Urine Glucose: NEGATIVE
Urobilinogen, UA: 0.2 (ref 0.0–1.0)
pH: 6 (ref 5.0–8.0)

## 2024-03-01 LAB — LIPID PANEL
Cholesterol: 104 mg/dL (ref 0–200)
HDL: 43 mg/dL (ref >39.00)
LDL Cholesterol: 46 mg/dL (ref 0–99)
NonHDL: 61.06
Total CHOL/HDL Ratio: 2
Triglycerides: 77 mg/dL (ref 0.0–149.0)
VLDL: 15.4 mg/dL (ref 0.0–40.0)

## 2024-03-01 LAB — COMPREHENSIVE METABOLIC PANEL WITH GFR
ALT: 12 U/L (ref 0–35)
AST: 20 U/L (ref 0–37)
Albumin: 4.3 g/dL (ref 3.5–5.2)
Alkaline Phosphatase: 93 U/L (ref 39–117)
BUN: 17 mg/dL (ref 6–23)
CO2: 31 meq/L (ref 19–32)
Calcium: 9.8 mg/dL (ref 8.4–10.5)
Chloride: 101 meq/L (ref 96–112)
Creatinine, Ser: 1.2 mg/dL (ref 0.40–1.20)
GFR: 41.55 mL/min — ABNORMAL LOW (ref >60.00)
Glucose, Bld: 136 mg/dL — ABNORMAL HIGH (ref 70–99)
Potassium: 4.4 meq/L (ref 3.5–5.1)
Sodium: 138 meq/L (ref 135–145)
Total Bilirubin: 0.9 mg/dL (ref 0.2–1.2)
Total Protein: 7.5 g/dL (ref 6.0–8.3)

## 2024-03-01 LAB — HEMOGLOBIN A1C: Hgb A1c MFr Bld: 7.7 % — ABNORMAL HIGH (ref 4.6–6.5)

## 2024-03-01 LAB — VITAMIN B12: Vitamin B-12: >1537 pg/mL — ABNORMAL HIGH (ref 211–911)

## 2024-03-01 LAB — TSH: TSH: 2.57 u[IU]/mL (ref 0.35–5.50)

## 2024-03-01 LAB — VITAMIN D 25 HYDROXY (VIT D DEFICIENCY, FRACTURES): VITD: 49.64 ng/mL (ref 30.00–100.00)

## 2024-03-01 NOTE — Assessment & Plan Note (Signed)
 Check CBC

## 2024-03-01 NOTE — Assessment & Plan Note (Signed)
 Hydrate well

## 2024-03-01 NOTE — Assessment & Plan Note (Signed)
 On Coumadin  Compr socks Elevate legs Loose wt

## 2024-03-01 NOTE — Progress Notes (Signed)
 Subjective:  Patient ID: Beth Miller, female    DOB: February 20, 1941  Age: 83 y.o. MRN: [id number]  CC: Edema (Bilateral edema (legs and feet, since September, has been using the compression stockings, only goes down at night). Referral to GI (patient notes of mid-abdominal pain for several years, [facility] Dr.Nandigam if possible). Weight loss (patient interested in GLP-1 medications))   HPI LAMIA NEGRIN presents for leg edema, wt gain, edema, bad veins  Outpatient Medications Prior to Visit  Medication Sig Dispense Refill   Accu-Chek Softclix Lancets lancets 1 each by Other route 2 (two) times daily. Use to check blood sugars twice a day 200 each 3   acetaminophen  (TYLENOL ) 500 MG tablet Take 1,000 mg by mouth every 6 (six) hours as needed for mild pain.     Alcohol Swabs (B-D SINGLE USE SWABS REGULAR) PADS 1 each by Does not apply route 2 (two) times daily. Use to clean finger to check blood sugars twice a day 200 each 3   Cholecalciferol (VITAMIN D3) 1000 UNIT tablet Take 1,000 Units by mouth daily.     colchicine  0.6 MG tablet Take two tablets prn gout attack.Then take another one in 1-2 hrs. Do not repeat for 3 days. 18 tablet 1   cyclobenzaprine  (FLEXERIL ) 10 MG tablet take 1/2 to 1 TABLET BY MOUTH THREE TIMES DAILY AS NEEDED FOR MUSCLE SPASMS 90 tablet 1   diltiazem  (CARTIA  XT) 240 MG 24 hr capsule TAKE 1 CAPSULE BY MOUTH DAILY 90 capsule 1   famotidine  (PEPCID ) 40 MG tablet Take 1 tablet (40 mg total) by mouth at bedtime. 90 tablet 3   furosemide  (LASIX ) 20 MG tablet TAKE 1 TABLET BY MOUTH EVERY  MORNING 90 tablet 3   glucose blood (ACCU-CHEK AVIVA PLUS) test strip TEST BLOOD SUGAR TWO TIMES DAILY 200 strip 3   JARDIANCE 10 MG TABS tablet Take 10 mg by mouth daily.     Lancet Devices (ACCU-CHEK SOFTCLIX) lancets 1 each by Other route 2 (two) times daily. Use as instructed 1 each 5   memantine  (NAMENDA ) 5 MG tablet TAKE 1 TABLET BY MOUTH TWICE  DAILY 180 tablet 3    metoprolol  succinate (TOPROL -XL) 50 MG 24 hr tablet TAKE ONE-HALF TABLET BY MOUTH  DAILY 45 tablet 1   pantoprazole  (PROTONIX ) 40 MG tablet TAKE 1 TABLET BY MOUTH EVERY  MORNING 90 tablet 3   potassium chloride  (KLOR-CON  M) 10 MEQ tablet TAKE 1 TABLET BY MOUTH TWICE  DAILY 180 tablet 3   vitamin B-12 (CYANOCOBALAMIN ) 1000 MCG tablet Take 1 tablet (1,000 mcg total) by mouth daily. 90 tablet 3   warfarin (COUMADIN ) 10 MG tablet TAKE 1 TABLET BY MOUTH DAILY OR  AS DIRECTED BY COUMADIN  CLINIC 90 tablet 0   warfarin (COUMADIN ) 5 MG tablet TAKE 1 TABLET TWICE WEEKLY ALONG WITH 10MG  DOSE OR AS ADVISED BY COUMADIN  CLINIC 90 tablet 1   promethazine -dextromethorphan (PROMETHAZINE -DM) 6.25-15 MG/5ML syrup Take 5 mLs by mouth 4 (four) times daily as needed for cough. 240 mL 0   furosemide  (LASIX ) 40 MG tablet Take 1 tablet (40 mg total) by mouth daily. 90 tablet 3   gabapentin  (NEURONTIN ) 100 MG capsule TAKE ONE CAPSULE BY MOUTH AT BEDTIME (Patient not taking: Reported on 03/01/2024) 90 capsule 3   loratadine  (ALLERGY RELIEF) 10 MG tablet Take 1 tablet (10 mg total) by mouth daily. (Patient not taking: Reported on 03/01/2024) 90 tablet 3   mirabegron  ER (MYRBETRIQ ) 25 MG TB24 tablet  Take 1 tablet (25 mg total) by mouth daily. (Patient not taking: Reported on 03/01/2024) 90 tablet 3   nitrofurantoin , macrocrystal-monohydrate, (MACROBID ) 100 MG capsule Take 1 capsule (100 mg total) by mouth 2 (two) times daily. (Patient not taking: Reported on 03/01/2024) 14 capsule 0   No facility-administered medications prior to visit.    ROS: Review of Systems  Constitutional:  Negative for activity change, appetite change, chills, fatigue and unexpected weight change.  HENT:  Negative for congestion, mouth sores and sinus pressure.   Eyes:  Negative for visual disturbance.  Respiratory:  Negative for cough and chest tightness.   Cardiovascular:  Positive for leg swelling.  Gastrointestinal:  Negative for abdominal pain, nausea  and vomiting.  Genitourinary:  Negative for difficulty urinating, frequency and vaginal pain.  Musculoskeletal:  Negative for back pain and gait problem.  Skin:  Positive for color change. Negative for pallor, rash and wound.  Neurological:  Negative for dizziness, tremors, weakness, numbness and headaches.  Psychiatric/Behavioral:  Negative for confusion, sleep disturbance and suicidal ideas.     Objective:  BP (!) 110/52   Pulse 93   Temp 99.4 F (37.4 C)   Ht 5\' 2"  (1.575 m)   Wt 197 lb (89.4 kg)   LMP  (LMP Unknown)   SpO2 95%   BMI 36.03 kg/m   BP Readings from Last 3 Encounters:  03/01/24 (!) 110/52  07/22/23 110/60  06/25/23 110/60    Wt Readings from Last 3 Encounters:  03/01/24 197 lb (89.4 kg)  11/07/23 187 lb (84.8 kg)  07/22/23 185 lb (83.9 kg)    Physical Exam Constitutional:      General: She is not in acute distress.    Appearance: She is well-developed.  HENT:     Head: Normocephalic.     Right Ear: External ear normal.     Left Ear: External ear normal.     Nose: Nose normal.  Eyes:     General:        Right eye: No discharge.        Left eye: No discharge.     Conjunctiva/sclera: Conjunctivae normal.     Pupils: Pupils are equal, round, and reactive to light.  Neck:     Thyroid : No thyromegaly.     Vascular: No JVD.     Trachea: No tracheal deviation.  Cardiovascular:     Rate and Rhythm: Normal rate and regular rhythm.     Heart sounds: Normal heart sounds.  Pulmonary:     Effort: No respiratory distress.     Breath sounds: No stridor. No wheezing.  Abdominal:     General: Bowel sounds are normal. There is no distension.     Palpations: Abdomen is soft. There is no mass.     Tenderness: There is no abdominal tenderness. There is no guarding or rebound.  Musculoskeletal:        General: No tenderness.     Cervical back: Normal range of motion and neck supple. No rigidity.     Right lower leg: Edema present.     Left lower leg: Edema  present.  Lymphadenopathy:     Cervical: No cervical adenopathy.  Skin:    Findings: Rash present. No erythema or lesion.  Neurological:     Mental Status: She is oriented to person, place, and time.     Cranial Nerves: No cranial nerve deficit.     Motor: No abnormal muscle tone.     Coordination: Coordination  normal.     Deep Tendon Reflexes: Reflexes normal.  Psychiatric:        Behavior: Behavior normal.        Thought Content: Thought content normal.        Judgment: Judgment normal.    Trace edema B feet B distal calves w/hyperpigmentation  Spider veins B    Lab Results  Component Value Date   WBC 5.0 06/25/2023   HGB 11.6 (L) 06/25/2023   HCT 36.5 06/25/2023   PLT 231.0 06/25/2023   GLUCOSE 131 (H) 06/25/2023   CHOL 116 01/22/2022   TRIG 92 01/22/2022   HDL 47 01/22/2022   LDLCALC 51 01/22/2022   ALT 13 06/25/2023   AST 23 06/25/2023   NA 140 06/25/2023   K 3.9 06/25/2023   CL 100 06/25/2023   CREATININE 1.17 06/25/2023   BUN 17 06/25/2023   CO2 32 06/25/2023   TSH 2.13 06/25/2023   INR 2.4 01/06/2024   HGBA1C 7.6 (H) 06/25/2023    No results found.  Assessment & Plan:   Problem List Items Addressed This Visit     Diabetes mellitus type 2, controlled, with complications (HCC)   Re-start Prandin  tid ac  Off Metformin  Freestyle Libre - pt never got it (Not covered). Using Accucheck Aviva plusCheck A1c      Relevant Orders   CBC with Differential/Platelet   Comprehensive metabolic panel with GFR   Iron, TIBC and Ferritin Panel   Hemoglobin A1c   Lipid panel   Urinalysis   TSH   Vitamin B12   VITAMIN D  25 Hydroxy (Vit-D Deficiency, Fractures)   Urinalysis   Vitamin D  deficiency - Primary   Cont w/Vit D      Relevant Orders   VITAMIN D  25 Hydroxy (Vit-D Deficiency, Fractures)   GERD   Pt declined meds She would like to see Dr Leonia Raman      Relevant Orders   Ambulatory referral to Gastroenterology   Anemia   Check CBC       Relevant Orders   CBC with Differential/Platelet   Iron, TIBC and Ferritin Panel   Vitamin B12   Paresthesia   Relevant Orders   Vitamin B12   Atrial fibrillation with rapid ventricular response (HCC)   In NSR      Relevant Orders   CBC with Differential/Platelet   Comprehensive metabolic panel with GFR   Iron, TIBC and Ferritin Panel   Chronic kidney disease, stage 3a (HCC)   Hydrate well      Relevant Orders   Comprehensive metabolic panel with GFR   Chronic diastolic CHF (congestive heart failure) (HCC)   On Coumadin  Check Labs      Relevant Orders   CBC with Differential/Platelet   Comprehensive metabolic panel with GFR   Iron, TIBC and Ferritin Panel   Hemoglobin A1c   Lipid panel   Urinalysis   TSH   Vitamin B12   VITAMIN D  25 Hydroxy (Vit-D Deficiency, Fractures)   Status post mitral valve replacement   On Coumadin  Check Labs      Chronic venous insufficiency   On Coumadin  Compr socks Elevate legs Loose wt         No orders of the defined types were placed in this encounter.     Follow-up: Return in about 3 months (around 06/01/2024) for a follow-up visit.  Anitra Barn, MD

## 2024-03-01 NOTE — Assessment & Plan Note (Signed)
Re-start Prandin tid ac  Off Metformin Freestyle Libre - pt never got it (Not covered). Using Accucheck Aviva plusCheck A1c

## 2024-03-01 NOTE — Assessment & Plan Note (Signed)
Cont w/Vit D 

## 2024-03-01 NOTE — Assessment & Plan Note (Signed)
 Pt declined meds She would like to see Dr Leonia Raman

## 2024-03-01 NOTE — Assessment & Plan Note (Signed)
In NSR 

## 2024-03-01 NOTE — Assessment & Plan Note (Signed)
 On Coumadin  Check Labs

## 2024-03-02 LAB — IRON,TIBC AND FERRITIN PANEL
%SAT: 13 % — ABNORMAL LOW (ref 16–45)
Ferritin: 30 ng/mL (ref 16–288)
Iron: 46 ug/dL (ref 45–160)
TIBC: 349 ug/dL (ref 250–450)

## 2024-03-03 ENCOUNTER — Ambulatory Visit

## 2024-03-04 ENCOUNTER — Encounter: Payer: Self-pay | Admitting: Internal Medicine

## 2024-03-10 ENCOUNTER — Ambulatory Visit: Attending: Cardiovascular Disease | Admitting: *Deleted

## 2024-03-10 DIAGNOSIS — I4821 Permanent atrial fibrillation: Secondary | ICD-10-CM | POA: Diagnosis not present

## 2024-03-10 DIAGNOSIS — Z7901 Long term (current) use of anticoagulants: Secondary | ICD-10-CM

## 2024-03-10 DIAGNOSIS — Z9889 Other specified postprocedural states: Secondary | ICD-10-CM | POA: Diagnosis not present

## 2024-03-10 LAB — POCT INR: INR: 2.1 (ref 2.0–3.0)

## 2024-03-10 NOTE — Patient Instructions (Signed)
 Description   Take 1.5 tablets today since you have missed a dose then continue taking Warfarin 10mg  (white tablet) daily.  Recheck INR in 4 weeks with MD appt. Stay consistent with greens 1-2 servings a week of greens.  Coumadin  Clinic [phone]

## 2024-03-30 ENCOUNTER — Encounter: Payer: Self-pay | Admitting: Internal Medicine

## 2024-03-30 NOTE — Progress Notes (Deleted)
 0 Cardiology Office Note:    Date:  03/30/2024   ID:  Beth Miller, DOB 1940/12/13, MRN [id number]  PCP:  Genia Kettering, MD   [facility] Providers Cardiologist:  Janelle Mediate, MD     Referring MD: Genia Kettering, MD    History of Present Illness:    Beth Miller is a very pleasant 83 y.o. female with a hx of longstanding atrial fib, mechanical MVR on chronic Coumadin , former smoker with COPD, DM, PVD, aortic atherosclerosis seen on CT, and carotid artery disease. She requires life-time SBE prophylaxis. Had childhood rheumatic fever that required hospitalization.   Prosthetic mitral valve replacement 1995, on coumadin . She established care with our group prior to 2013 and has maintained regular follow-up. No documentation of CAD, low risk myoview  2014.   In January 2022 she had digoxin  toxicity with level over 3 and junctional bigeminy on EKG which resolved with d/c of digoxin .  Post monitor 12/12/2020 shows A-fib with rates in the 70s, no malignant ventricular beats or junctional rhythm.  In April 2022 she was hospitalized with sepsis unknown source and atrial fibrillation rates were elevated during admission.  Medical management with Cardizem  and Toprol . TEE was advised but patient left before it could be completed. TTE 02/10/21 revealed EF 55%, severe biatrial enlargement, stable MVR Saint Jude mechanical valve with mean gradient 4, trivial MR, no obvious SBE, moderate to severe TR.   She has had some LE edema improved with lasix  She has seen Dr Micael Adas for severe OSA but has issues wearing CPAP mask She does not want to repeat study or wear mask  Has recurrent gout in right great toe Painful  She has had a congestive cough and URI seen in ED 10/09/22 and resp panel negative CXR CE chronic bronchitic changes Given prednisone  10 mg to take daily  COVID positive 05/22/23 Cough / sore throat with fever 102.5 Afib rate up during illness Did not appear to take molnupiravir   Cannot take Paxlovid on coumadin  per ER but I think this is relative if INR checked more frequently   Recovered well Helped husband celebrate 75 th birthday. LE edema better on lasix  Has LE varicosities Weight up 12 lbs Using lasix  20 mg daily  BUN 17 Cr 1.2 K 4.4 03/01/24  ***    Past Medical History:  Diagnosis Date   AF (atrial fibrillation) (HCC)    ALLERGIC RHINITIS    Blood transfusion without reported diagnosis    Cataract, senile    Chest pain    CHF (congestive heart failure) (HCC)    Chronic anticoagulation    Diabetes mellitus    Diabetes mellitus    Disorder of oral soft tissue    of mouth   Diverticulosis    GERD (gastroesophageal reflux disease)    Hematoma    Hypertension    Leg pain    Low back pain    Paresthesia    Rheumatic fever    Status post mitral valve replacement    St. Jude valve   Stroke (HCC)    tia   Sweating    Vitamin D  deficiency     Past Surgical History:  Procedure Laterality Date   ABDOMINAL HYSTERECTOMY     CARDIAC VALVE REPLACEMENT  1995   Mitral valve prosthesis; st jude   CHOLECYSTECTOMY      Current Medications: No outpatient medications have been marked as taking for the 04/09/24 encounter (Appointment) with Victoriano Campion C, MD.  Allergies:   Aricept  [donepezil  hcl], Food, Furosemide , Metformin  and related, Quinine, Spironolactone , Tramadol , and Sulfadiazine   Social History   Socioeconomic History   Marital status: Married    Spouse name: Samual   Number of children: 1   Years of education: Not on file   Highest education level: Not on file  Occupational History   Occupation: Retired    Associate Professor: RETIRED  Tobacco Use   Smoking status: Former    Current packs/day: 0.00    Average packs/day: (1.2 ttl pk-yrs)    Types: Cigarettes    Start date: 10/28/1942    Quit date: 10/28/2002    Years since quitting: 21.4    Passive exposure: Never   Smokeless tobacco: Never   Tobacco comments:    quit x 13 years   Vaping  Use   Vaping status: Never Used  Substance and Sexual Activity   Alcohol use: No    Alcohol/week: 0.0 standard drinks of alcohol   Drug use: No   Sexual activity: Yes    Partners: Male  Other Topics Concern   Not on file  Social History Narrative   Negative Family History of Colon Cancer      Regular exercise-yes, bowling      Daily caffeine Use-rare      Lives with husbands-2025   Social Drivers of Health   Financial Resource Strain: Low Risk  (11/07/2023)   Overall Financial Resource Strain (CARDIA)    Difficulty of Paying Living Expenses: Not very hard  Food Insecurity: No Food Insecurity (11/07/2023)   Hunger Vital Sign    Worried About Running Out of Food in the Last Year: Never true    Ran Out of Food in the Last Year: Never true  Transportation Needs: No Transportation Needs (11/07/2023)   PRAPARE - Administrator, Civil Service (Medical): No    Lack of Transportation (Non-Medical): No  Physical Activity: Inactive (11/07/2023)   Exercise Vital Sign    Days of Exercise per Week: 0 days    Minutes of Exercise per Session: 0 min  Stress: No Stress Concern Present (11/07/2023)   Beth Miller [employer] Occupational Health - Occupational Stress Questionnaire    Feeling of Stress : Not at all  Social Connections: Socially Integrated (11/07/2023)   Social Connection and Isolation Panel [NHANES]    Frequency of Communication with Friends and Family: More than three times a week    Frequency of Social Gatherings with Friends and Family: Three times a week    Attends Religious Services: More than 4 times per year    Active Member of Clubs or Organizations: Yes    Attends Banker Meetings: Never    Marital Status: Married     Family History: The patient's family history includes Diabetes in her brother and mother; Heart disease in her brother and brother; Other in her mother; Pancreatic cancer in her brother; Seizures in her mother. There is no history of  Colon cancer.  ROS:   Please see the history of present illness.    + fatigue All other systems reviewed and are negative.  Labs/Other Studies Reviewed:    The following studies were reviewed today:  Echo 02/10/21  Left Ventricle: Left ventricular ejection fraction, by estimation, is  55%%. The left ventricle has low normal function. The left ventricle has  no regional wall motion abnormalities. Definity  contrast agent was given  IV to delineate the left ventricular  endocardial borders. The left ventricular internal  cavity size was normal  in size. There is mild left ventricular hypertrophy. Left ventricular  diastolic parameters are indeterminate.  Right Ventricle: The right ventricular size is normal. Right vetricular  wall thickness was not assessed. Right ventricular systolic function is  low normal. There is moderately elevated pulmonary artery systolic  pressure. The tricuspid regurgitant  velocity is 3.07 m/s, and with an assumed right atrial pressure of 15  mmHg, the estimated right ventricular systolic pressure is 52.7 mmHg.  Left Atrium: Left atrial size was severely dilated.  Right Atrium: Right atrial size was severely dilated.  Pericardium: Trivial pericardial effusion is present.  Mitral Valve: MV prosthesis (St. Jude mechanical) is well seated, appears  to open well. mean gradient through the valve is approximately 4 mm Hg.  The mitral valve has been repaired/replaced. Trivial mitral valve  regurgitation. There is a St. Jude  mechanical valve present in the mitral position. The mean mitral valve  gradient is 4.0 mmHg.  Tricuspid Valve: TR is eccentric, directed toward the interatrial septum.  The tricuspid valve is normal in structure. Tricuspid valve regurgitation  is moderate to severe.  Aortic Valve: The aortic valve is tricuspid. Aortic valve regurgitation is  not visualized. Mild to moderate aortic valve sclerosis/calcification is  present, without any  evidence of aortic stenosis. Aortic valve mean  gradient measures 5.4 mmHg. Aortic  valve peak gradient measures 9.4 mmHg. Aortic valve area, by VTI measures  1.38 cm.  Pulmonic Valve: The pulmonic valve was normal in structure. Pulmonic valve  regurgitation is not visualized.  Aorta: The aortic root is normal in size and structure and aortic  dilatation noted. There is mild dilatation of the ascending aorta,  measuring 40 mm.  Venous: The inferior vena cava is dilated in size with less than 50%  respiratory variability, suggesting right atrial pressure of 15 mmHg.  IAS/Shunts: No atrial level shunt detected by color flow Doppler.   Cardiac monitor 11/2020  Frequent episodes of atrial flutter PVCls occasional Does have episodes of conversion    Myocardial Perfusion Imaging 12/2012 Normal perfusion, low risk   Recent Labs: 05/22/2023: NT-Pro BNP 312 03/01/2024: ALT 12; BUN 17; Creatinine, Ser 1.20; Hemoglobin 11.0; Platelets 191.0; Potassium 4.4; Sodium 138; TSH 2.57  Recent Lipid Panel    Component Value Date/Time   CHOL 104 03/01/2024 1204   CHOL 116 01/22/2022 1120   TRIG 77.0 03/01/2024 1204   HDL 43.00 03/01/2024 1204   HDL 47 01/22/2022 1120   CHOLHDL 2 03/01/2024 1204   VLDL 15.4 03/01/2024 1204   LDLCALC 46 03/01/2024 1204   LDLCALC 51 01/22/2022 1120     Risk Assessment/Calculations:    CHA2DS2-VASc Score =    {This indicates a  % annual risk of stroke. The patient's score is based upon:      Physical Exam:    VS:  LMP  (LMP Unknown)     Wt Readings from Last 3 Encounters:  03/01/24 197 lb (89.4 kg)  11/07/23 187 lb (84.8 kg)  07/22/23 185 lb (83.9 kg)     Affect appropriate Overweight black female  HEENT: normal Neck supple with no adenopathy JVP normal no bruits no thyromegaly Lungs  rhonchi and expitory wheezing  Heart:  S1/ click S2 no murmur, no rub, gallop or click PMI normal Abdomen: benighn, BS positve, no tenderness, no AAA no bruit.   No HSM or HJR Distal pulses intact with no bruits *** edema with varicosities  Neuro non-focal Gout with swelling/erythema right >  toe    EKG:   05/14/21 afib rate 112 nonspecific ST changes  03/30/2024 afib rate 90 nonspecific ST changes   Diagnoses:    No diagnosis found.   Assessment and Plan:     Mitral valve disese, rheumatic/Hx MVR on chronic anticoagulation: Mitral valve replaced 1995.  Normal valve click without signs and symptoms of worsening valve function. Normal valve function by echo 01/2021. Management of coumadin  by our coumadin  clinic. INR 2.1 03/10/24 Update TTE   Dyspnea: LVEF 55%, no rwma, mild LVH, indeterminate diastolic parameters by echo 01/2021. Lasix  dose increased BNP was normal 11/15/21 Myovue done 01/31/22 no ischemia stable   OSA: re established with Dr Micael Adas severe OSA compliance with mask discussed   Permanent atrial fibrillation: Severe bi-atrial enlargement by echo 01/2021. Dig toxicity in past beta blocker decreased by primary 04/09/22 for low pulse  She will take Cardizem  in am and Toprol  at lunch   Aortic atherosclerosis/Hyperlipidemia LDL goal < 70: LDL   51 on current statin Rx   Fatigue: related to age, afib and poor compliance with CPAP   DM:  metformin  d/c due to stomach upset A1c 8.2 f/u Dr Amparo [place] Started on Prandin    GERD:  not much better off Metformin  Creon  did not help PRN Imodium Continue Protonix  and pepcid    Urinary Incontinence:  continue Myrbetriq   Gout:  left great toe f/u Plotnicov post Rx with colchicine  Uric acid elevated 7.5 f/u primary consider allopurinol   URI:  flu/covid/RSV negative congestive cough on prednisone  per ER CXR ok mild leukocytosis f/u Dr Amparo [place]   Peripheral edema:  from obesity weight gain, LE venous dx ***    Update TTE for EF and MVR   Disposition: F/U in  6 months      Signed, Janelle Mediate, MD  03/30/2024 10:12 AM    [facility]

## 2024-04-09 ENCOUNTER — Ambulatory Visit: Payer: Medicare Other | Attending: Cardiovascular Disease | Admitting: Cardiovascular Disease

## 2024-04-09 ENCOUNTER — Ambulatory Visit

## 2024-04-12 ENCOUNTER — Encounter: Payer: Self-pay | Admitting: Cardiovascular Disease

## 2024-04-25 ENCOUNTER — Other Ambulatory Visit: Payer: Self-pay | Admitting: Cardiovascular Disease

## 2024-04-25 DIAGNOSIS — I4821 Permanent atrial fibrillation: Secondary | ICD-10-CM

## 2024-05-05 ENCOUNTER — Other Ambulatory Visit: Payer: Self-pay | Admitting: Cardiovascular Disease

## 2024-06-01 ENCOUNTER — Ambulatory Visit: Payer: Self-pay | Admitting: Internal Medicine

## 2024-06-02 ENCOUNTER — Ambulatory Visit (INDEPENDENT_AMBULATORY_CARE_PROVIDER_SITE_OTHER): Admitting: Internal Medicine

## 2024-06-02 ENCOUNTER — Ambulatory Visit (INDEPENDENT_AMBULATORY_CARE_PROVIDER_SITE_OTHER)

## 2024-06-02 ENCOUNTER — Encounter: Payer: Self-pay | Admitting: Internal Medicine

## 2024-06-02 VITALS — BP 118/70 | HR 62 | Temp 98.1°F | Ht 62.0 in | Wt 197.8 lb

## 2024-06-02 DIAGNOSIS — E118 Type 2 diabetes mellitus with unspecified complications: Secondary | ICD-10-CM | POA: Diagnosis not present

## 2024-06-02 DIAGNOSIS — I5032 Chronic diastolic (congestive) heart failure: Secondary | ICD-10-CM

## 2024-06-02 DIAGNOSIS — I517 Cardiomegaly: Secondary | ICD-10-CM | POA: Diagnosis not present

## 2024-06-02 DIAGNOSIS — I509 Heart failure, unspecified: Secondary | ICD-10-CM | POA: Diagnosis not present

## 2024-06-02 DIAGNOSIS — R0602 Shortness of breath: Secondary | ICD-10-CM

## 2024-06-02 DIAGNOSIS — R202 Paresthesia of skin: Secondary | ICD-10-CM

## 2024-06-02 DIAGNOSIS — Z7901 Long term (current) use of anticoagulants: Secondary | ICD-10-CM

## 2024-06-02 DIAGNOSIS — N1831 Chronic kidney disease, stage 3a: Secondary | ICD-10-CM

## 2024-06-02 DIAGNOSIS — E119 Type 2 diabetes mellitus without complications: Secondary | ICD-10-CM

## 2024-06-02 DIAGNOSIS — G4733 Obstructive sleep apnea (adult) (pediatric): Secondary | ICD-10-CM | POA: Diagnosis not present

## 2024-06-02 LAB — CBC WITH DIFFERENTIAL/PLATELET
Basophils Absolute: 0 K/uL (ref 0.0–0.1)
Basophils Relative: 0.9 % (ref 0.0–3.0)
Eosinophils Absolute: 0.1 K/uL (ref 0.0–0.7)
Eosinophils Relative: 1.2 % (ref 0.0–5.0)
HCT: 36.4 % (ref 36.0–46.0)
Hemoglobin: 11.7 g/dL — ABNORMAL LOW (ref 12.0–15.0)
Lymphocytes Relative: 27.2 % (ref 12.0–46.0)
Lymphs Abs: 1.1 K/uL (ref 0.7–4.0)
MCHC: 32.2 g/dL (ref 30.0–36.0)
MCV: 81 fl (ref 78.0–100.0)
Monocytes Absolute: 0.7 K/uL (ref 0.1–1.0)
Monocytes Relative: 16.5 % — ABNORMAL HIGH (ref 3.0–12.0)
Neutro Abs: 2.2 K/uL (ref 1.4–7.7)
Neutrophils Relative %: 54.2 % (ref 43.0–77.0)
Platelets: 193 K/uL (ref 150.0–400.0)
RBC: 4.5 Mil/uL (ref 3.87–5.11)
RDW: 17.2 % — ABNORMAL HIGH (ref 11.5–15.5)
WBC: 4 K/uL (ref 4.0–10.5)

## 2024-06-02 LAB — URINALYSIS, ROUTINE W REFLEX MICROSCOPIC
Bilirubin Urine: NEGATIVE
Ketones, ur: NEGATIVE
Leukocytes,Ua: NEGATIVE
Nitrite: NEGATIVE
Specific Gravity, Urine: 1.015 (ref 1.000–1.030)
Total Protein, Urine: NEGATIVE
Urine Glucose: NEGATIVE
Urobilinogen, UA: 1 (ref 0.0–1.0)
pH: 6.5 (ref 5.0–8.0)

## 2024-06-02 LAB — T4, FREE: Free T4: 0.94 ng/dL (ref 0.60–1.60)

## 2024-06-02 LAB — COMPREHENSIVE METABOLIC PANEL WITH GFR
ALT: 10 U/L (ref 0–35)
AST: 21 U/L (ref 0–37)
Albumin: 4.2 g/dL (ref 3.5–5.2)
Alkaline Phosphatase: 79 U/L (ref 39–117)
BUN: 18 mg/dL (ref 6–23)
CO2: 33 meq/L — ABNORMAL HIGH (ref 19–32)
Calcium: 9.6 mg/dL (ref 8.4–10.5)
Chloride: 102 meq/L (ref 96–112)
Creatinine, Ser: 1.18 mg/dL (ref 0.40–1.20)
GFR: 42.32 mL/min — ABNORMAL LOW (ref >60.00)
Glucose, Bld: 119 mg/dL — ABNORMAL HIGH (ref 70–99)
Potassium: 3.9 meq/L (ref 3.5–5.1)
Sodium: 140 meq/L (ref 135–145)
Total Bilirubin: 0.9 mg/dL (ref 0.2–1.2)
Total Protein: 7.4 g/dL (ref 6.0–8.3)

## 2024-06-02 LAB — HEMOGLOBIN A1C: Hgb A1c MFr Bld: 7.5 % — ABNORMAL HIGH (ref 4.6–6.5)

## 2024-06-02 LAB — TSH: TSH: 2.48 u[IU]/mL (ref 0.35–5.50)

## 2024-06-02 MED ORDER — FUROSEMIDE 40 MG PO TABS: 40.0000 mg | ORAL_TABLET | Freq: Every day | ORAL | 3 refills | Status: AC

## 2024-06-02 MED ORDER — OZEMPIC (0.25 OR 0.5 MG/DOSE) 2 MG/3ML ~~LOC~~ SOPN: PEN_INJECTOR | SUBCUTANEOUS | 3 refills | Status: AC

## 2024-06-02 MED ORDER — JARDIANCE 10 MG PO TABS: 10.0000 mg | ORAL_TABLET | Freq: Every day | ORAL | 3 refills | Status: AC

## 2024-06-02 NOTE — Assessment & Plan Note (Signed)
 On Coumadin  Risks associated with testing INR monthly noncompliance were discussed. Compliance was encouraged.

## 2024-06-02 NOTE — Assessment & Plan Note (Signed)
 OSA - refusing to use CPAP

## 2024-06-02 NOTE — Assessment & Plan Note (Addendum)
 Worsening SOB, wt gain - check CXR Wt loss need was discussed On Jardiance , Furosemide  On Gabapentin  Start Ozempic 

## 2024-06-02 NOTE — Assessment & Plan Note (Signed)
Start Ozempic 

## 2024-06-02 NOTE — Assessment & Plan Note (Signed)
 Hydrate well

## 2024-06-02 NOTE — Assessment & Plan Note (Signed)
 On Gabapentin

## 2024-06-02 NOTE — Progress Notes (Signed)
 Subjective:  Patient ID: Beth Miller, female    DOB: July 29, 1941  Age: 83 y.o. MRN: [id number]  CC: Follow-up, Hand Pain (Right hand, thinks it is neuropathy is getting worse), Abdominal Pain (Feels like the scar tissue from Gallbladder removal is getting inflammed, causing her occasional pain, felt warm yesterday), and Weight Loss (Wants to discuss something for curving her appetite)   HPI Beth Miller presents for CHF, HTN, R arm pain - neuropathy C/o wt gain - DOE w/it. Pt has OSA - refusing to use CPAP  Outpatient Medications Prior to Visit  Medication Sig Dispense Refill   Accu-Chek Softclix Lancets lancets 1 each by Other route 2 (two) times daily. Use to check blood sugars twice a day 200 each 3   acetaminophen  (TYLENOL ) 500 MG tablet Take 1,000 mg by mouth every 6 (six) hours as needed for mild pain.     Alcohol Swabs (B-D SINGLE USE SWABS REGULAR) PADS 1 each by Does not apply route 2 (two) times daily. Use to clean finger to check blood sugars twice a day 200 each 3   Cholecalciferol (VITAMIN D3) 1000 UNIT tablet Take 1,000 Units by mouth daily.     colchicine  0.6 MG tablet Take two tablets prn gout attack.Then take another one in 1-2 hrs. Do not repeat for 3 days. 18 tablet 1   cyclobenzaprine  (FLEXERIL ) 10 MG tablet take 1/2 to 1 TABLET BY MOUTH THREE TIMES DAILY AS NEEDED FOR MUSCLE SPASMS 90 tablet 1   diltiazem  (CARDIZEM  CD) 240 MG 24 hr capsule Take 1 capsule (240 mg total) by mouth daily. NEED OV. 100 capsule 0   famotidine  (PEPCID ) 40 MG tablet Take 1 tablet (40 mg total) by mouth at bedtime. 90 tablet 3   gabapentin  (NEURONTIN ) 100 MG capsule TAKE ONE CAPSULE BY MOUTH AT BEDTIME 90 capsule 3   glucose blood (ACCU-CHEK AVIVA PLUS) test strip TEST BLOOD SUGAR TWO TIMES DAILY 200 strip 3   Lancet Devices (ACCU-CHEK SOFTCLIX) lancets 1 each by Other route 2 (two) times daily. Use as instructed 1 each 5   loratadine  (ALLERGY RELIEF) 10 MG tablet Take 1 tablet (10 mg  total) by mouth daily. 90 tablet 3   memantine  (NAMENDA ) 5 MG tablet TAKE 1 TABLET BY MOUTH TWICE  DAILY 180 tablet 3   metoprolol  succinate (TOPROL -XL) 50 MG 24 hr tablet Take 0.5 tablets (25 mg total) by mouth daily. NEED OV. 50 tablet 0   mirabegron  ER (MYRBETRIQ ) 25 MG TB24 tablet Take 1 tablet (25 mg total) by mouth daily. 90 tablet 3   nitrofurantoin , macrocrystal-monohydrate, (MACROBID ) 100 MG capsule Take 1 capsule (100 mg total) by mouth 2 (two) times daily. 14 capsule 0   pantoprazole  (PROTONIX ) 40 MG tablet TAKE 1 TABLET BY MOUTH EVERY  MORNING 90 tablet 3   potassium chloride  (KLOR-CON  M) 10 MEQ tablet TAKE 1 TABLET BY MOUTH TWICE  DAILY 180 tablet 3   vitamin B-12 (CYANOCOBALAMIN ) 1000 MCG tablet Take 1 tablet (1,000 mcg total) by mouth daily. 90 tablet 3   warfarin (COUMADIN ) 10 MG tablet TAKE 1 TABLET BY MOUTH DAILY OR  AS DIRECTED BY COUMADIN  CLINIC 90 tablet 3   warfarin (COUMADIN ) 5 MG tablet TAKE 1 TABLET TWICE WEEKLY ALONG WITH 10MG  DOSE OR AS ADVISED BY COUMADIN  CLINIC 90 tablet 1   furosemide  (LASIX ) 20 MG tablet TAKE 1 TABLET BY MOUTH EVERY  MORNING 90 tablet 3   furosemide  (LASIX ) 40 MG tablet Take 1 tablet (40  mg total) by mouth daily. 90 tablet 3   JARDIANCE  10 MG TABS tablet Take 10 mg by mouth daily.     No facility-administered medications prior to visit.    ROS: Review of Systems  Constitutional:  Positive for appetite change and unexpected weight change. Negative for activity change, chills and fatigue.  HENT:  Negative for congestion, mouth sores and sinus pressure.   Eyes:  Negative for visual disturbance.  Respiratory:  Negative for cough and chest tightness.   Gastrointestinal:  Negative for abdominal pain and nausea.  Genitourinary:  Negative for difficulty urinating, frequency and vaginal pain.  Musculoskeletal:  Positive for arthralgias. Negative for back pain and gait problem.  Skin:  Negative for pallor and rash.  Neurological:  Negative for  dizziness, tremors, weakness, numbness and headaches.  Psychiatric/Behavioral:  Negative for confusion, sleep disturbance and suicidal ideas.     Objective:  BP 118/70 (BP Location: Left Arm, Patient Position: Sitting)   Pulse 62   Temp 98.1 F (36.7 C) (Temporal)   Ht 5' 2 (1.575 m)   Wt 197 lb 12.8 oz (89.7 kg)   LMP  (LMP Unknown)   SpO2 95%   BMI 36.18 kg/m   BP Readings from Last 3 Encounters:  06/02/24 118/70  03/01/24 (!) 110/52  07/22/23 110/60    Wt Readings from Last 3 Encounters:  06/02/24 197 lb 12.8 oz (89.7 kg)  03/01/24 197 lb (89.4 kg)  11/07/23 187 lb (84.8 kg)    Physical Exam Constitutional:      General: She is not in acute distress.    Appearance: She is well-developed. She is obese.  HENT:     Head: Normocephalic.     Right Ear: External ear normal.     Left Ear: External ear normal.     Nose: Nose normal.  Eyes:     General:        Right eye: No discharge.        Left eye: No discharge.     Conjunctiva/sclera: Conjunctivae normal.     Pupils: Pupils are equal, round, and reactive to light.  Neck:     Thyroid : No thyromegaly.     Vascular: No JVD.     Trachea: No tracheal deviation.  Cardiovascular:     Rate and Rhythm: Normal rate and regular rhythm.     Heart sounds: Normal heart sounds.  Pulmonary:     Effort: No respiratory distress.     Breath sounds: No stridor. No wheezing.  Abdominal:     General: Bowel sounds are normal. There is no distension.     Palpations: Abdomen is soft. There is no mass.     Tenderness: There is no abdominal tenderness. There is no guarding or rebound.  Musculoskeletal:        General: No tenderness.     Cervical back: Normal range of motion and neck supple. No rigidity.  Lymphadenopathy:     Cervical: No cervical adenopathy.  Skin:    Findings: No erythema or rash.  Neurological:     Cranial Nerves: No cranial nerve deficit.     Motor: No abnormal muscle tone.     Coordination: Coordination  normal.     Deep Tendon Reflexes: Reflexes normal.  Psychiatric:        Behavior: Behavior normal.        Thought Content: Thought content normal.        Judgment: Judgment normal.     Lab Results  Component Value Date   WBC 4.7 03/01/2024   HGB 11.0 (L) 03/01/2024   HCT 33.6 (L) 03/01/2024   PLT 191.0 03/01/2024   GLUCOSE 136 (H) 03/01/2024   CHOL 104 03/01/2024   TRIG 77.0 03/01/2024   HDL 43.00 03/01/2024   LDLCALC 46 03/01/2024   ALT 12 03/01/2024   AST 20 03/01/2024   NA 138 03/01/2024   K 4.4 03/01/2024   CL 101 03/01/2024   CREATININE 1.20 03/01/2024   BUN 17 03/01/2024   CO2 31 03/01/2024   TSH 2.57 03/01/2024   INR 2.1 03/10/2024   HGBA1C 7.7 (H) 03/01/2024    No results found.  Assessment & Plan:   Problem List Items Addressed This Visit     Anticoagulant long-term use   On Coumadin  Risks associated with testing INR monthly noncompliance were discussed. Compliance was encouraged.      Chronic diastolic CHF (congestive heart failure) (HCC) - Primary   Worsening SOB, wt gain - check CXR Wt loss need was discussed On Jardiance , Furosemide  On Gabapentin  Start Ozempic        Relevant Medications   furosemide  (LASIX ) 40 MG tablet   Other Relevant Orders   DG Chest 2 View   Comprehensive metabolic panel with GFR   CBC with Differential/Platelet   Iron, TIBC and Ferritin Panel   Hemoglobin A1c   T4, free   TSH   Urinalysis   Chronic kidney disease, stage 3a (HCC)   Hydrate well      Relevant Orders   DG Chest 2 View   Comprehensive metabolic panel with GFR   CBC with Differential/Platelet   Iron, TIBC and Ferritin Panel   Hemoglobin A1c   T4, free   TSH   Urinalysis   Diabetes mellitus type 2, controlled, with complications (HCC)   Start Ozempic       Relevant Medications   JARDIANCE  10 MG TABS tablet   Semaglutide ,0.25 or 0.5MG /DOS, (OZEMPIC , 0.25 OR 0.5 MG/DOSE,) 2 MG/3ML SOPN   OSA (obstructive sleep apnea)   OSA - refusing to  use CPAP      Paresthesia   On Gabapentin       Other Visit Diagnoses       Diabetes mellitus without complication (HCC)       Relevant Medications   JARDIANCE  10 MG TABS tablet   Semaglutide ,0.25 or 0.5MG /DOS, (OZEMPIC , 0.25 OR 0.5 MG/DOSE,) 2 MG/3ML SOPN   Other Relevant Orders   Hemoglobin A1c         Meds ordered this encounter  Medications   JARDIANCE  10 MG TABS tablet    Sig: Take 1 tablet (10 mg total) by mouth daily.    Dispense:  90 tablet    Refill:  3   furosemide  (LASIX ) 40 MG tablet    Sig: Take 1 tablet (40 mg total) by mouth daily.    Dispense:  90 tablet    Refill:  3   Semaglutide ,0.25 or 0.5MG /DOS, (OZEMPIC , 0.25 OR 0.5 MG/DOSE,) 2 MG/3ML SOPN    Sig: Use 0.25 mg weekly sq for 1 month, then 0.5 mg sq weekly    Dispense:  3 mL    Refill:  3      Follow-up: Return for a follow-up visit.  Marolyn Noel, MD

## 2024-06-03 LAB — IRON,TIBC AND FERRITIN PANEL
%SAT: 17 % (ref 16–45)
Ferritin: 18 ng/mL (ref 16–288)
Iron: 67 ug/dL (ref 45–160)
TIBC: 390 ug/dL (ref 250–450)

## 2024-06-04 ENCOUNTER — Ambulatory Visit: Payer: Self-pay | Admitting: Internal Medicine

## 2024-06-07 ENCOUNTER — Other Ambulatory Visit: Payer: Self-pay

## 2024-06-07 DIAGNOSIS — I4821 Permanent atrial fibrillation: Secondary | ICD-10-CM

## 2024-06-07 MED ORDER — WARFARIN SODIUM 5 MG PO TABS: ORAL_TABLET | ORAL | 0 refills | Status: AC

## 2024-06-25 ENCOUNTER — Ambulatory Visit: Attending: Cardiovascular Disease | Admitting: *Deleted

## 2024-06-25 DIAGNOSIS — Z9889 Other specified postprocedural states: Secondary | ICD-10-CM | POA: Diagnosis not present

## 2024-06-25 DIAGNOSIS — Z7901 Long term (current) use of anticoagulants: Secondary | ICD-10-CM | POA: Diagnosis not present

## 2024-06-25 DIAGNOSIS — I4821 Permanent atrial fibrillation: Secondary | ICD-10-CM | POA: Diagnosis not present

## 2024-06-25 LAB — POCT INR: INR: 2 (ref 2.0–3.0)

## 2024-06-25 NOTE — Patient Instructions (Addendum)
 Description   INR-2.0; Take 1.5 tablets today then continue taking Warfarin 10mg  (white tablet) daily.  Recheck INR in 4 weeks. Stay consistent with greens 1-2 servings a week of greens.  Coumadin  Clinic [phone]

## 2024-06-25 NOTE — Progress Notes (Addendum)
 Description   INR-2.0; Take 1.5 tablets today then continue taking Warfarin 10mg  (white tablet) daily.  Recheck INR in 4 weeks. Stay consistent with greens 1-2 servings a week of greens.  Coumadin  Clinic [phone]

## 2024-07-07 ENCOUNTER — Other Ambulatory Visit: Payer: Self-pay | Admitting: Cardiovascular Disease

## 2024-07-21 ENCOUNTER — Other Ambulatory Visit: Payer: Medicare HMO

## 2024-07-23 ENCOUNTER — Encounter

## 2024-07-26 ENCOUNTER — Other Ambulatory Visit (HOSPITAL_BASED_OUTPATIENT_CLINIC_OR_DEPARTMENT_OTHER)

## 2024-07-26 ENCOUNTER — Ambulatory Visit: Attending: Cardiovascular Disease | Admitting: *Deleted

## 2024-07-26 DIAGNOSIS — Z9889 Other specified postprocedural states: Secondary | ICD-10-CM | POA: Diagnosis not present

## 2024-07-26 DIAGNOSIS — Z7901 Long term (current) use of anticoagulants: Secondary | ICD-10-CM

## 2024-07-26 DIAGNOSIS — I4821 Permanent atrial fibrillation: Secondary | ICD-10-CM

## 2024-07-26 LAB — POCT INR: INR: 3.4 — AB (ref 2.0–3.0)

## 2024-07-26 NOTE — Progress Notes (Signed)
 Description   INR-3.4; Continue taking Warfarin 10mg  (white tablet) daily.  Recheck INR in 4 weeks with MD appt. Stay consistent with greens 1-2 servings a week of greens.  Coumadin  Clinic [phone]

## 2024-07-26 NOTE — Patient Instructions (Signed)
 Description   INR-3.4; Continue taking Warfarin 10mg  (white tablet) daily.  Recheck INR in 4 weeks with MD appt. Stay consistent with greens 1-2 servings a week of greens.  Coumadin  Clinic [phone]

## 2024-08-02 ENCOUNTER — Ambulatory Visit: Admitting: Internal Medicine

## 2024-08-16 NOTE — Progress Notes (Unsigned)
 0 Cardiology Office Note:    Date:  08/20/2024   ID:  Beth Miller, DOB 1941-01-15, MRN [id number]  PCP:  Garald Karlynn GAILS, MD   [facility] Providers Cardiologist:  Maude Emmer, MD     Referring MD: Garald Karlynn GAILS, MD    History of Present Illness:    Beth Miller is a very pleasant 83 y.o. female with a hx of longstanding atrial fib, mechanical MVR on chronic Coumadin , former smoker with COPD, DM, PVD, aortic atherosclerosis seen on CT, and carotid artery disease. She requires life-time SBE prophylaxis. Had childhood rheumatic fever that required hospitalization.   Prosthetic mitral valve replacement 1995, on coumadin . She established care with our group prior to 2013 and has maintained regular follow-up. No documentation of CAD, low risk myoview  2014.   In January 2022 she had digoxin  toxicity with level over 3 and junctional bigeminy on EKG which resolved with d/c of digoxin .  Post monitor 12/12/2020 shows A-fib with rates in the 70s, no malignant ventricular beats or junctional rhythm.  In April 2022 she was hospitalized with sepsis unknown source and atrial fibrillation rates were elevated during admission.  Medical management with Cardizem  and Toprol . TEE was advised but patient left before it could be completed. TTE 02/10/21 revealed EF 55%, severe biatrial enlargement, stable MVR Saint Jude mechanical valve with mean gradient 4, trivial MR, no obvious SBE, moderate to severe TR.   She has had some LE edema improved with lasix  Has LE varicosities  She has seen Dr Shlomo for severe OSA but has issues wearing CPAP mask She does not want to repeat study or wear mask  Has recurrent gout in right great toe Painful  She has had a congestive cough and URI seen in ED 10/09/22 and resp panel negative CXR CE chronic bronchitic changes Given prednisone  10 mg to take daily  COVID positive 05/22/23 Cough / sore throat with fever 102.5 Afib rate up during illness Did not appear  to take molnupiravir  Cannot take Paxlovid on coumadin  per ER but I think this is relative if INR checked more frequentl  Seen by Primary August 2025 with weight gain 10 lbs and dyspnea. Started on Ozempic  CXR 06/09/24 NAD      Past Medical History:  Diagnosis Date   AF (atrial fibrillation) (HCC)    ALLERGIC RHINITIS    Blood transfusion without reported diagnosis    Cataract, senile    Chest pain    CHF (congestive heart failure) (HCC)    Chronic anticoagulation    Diabetes mellitus    Diabetes mellitus    Disorder of oral soft tissue    of mouth   Diverticulosis    GERD (gastroesophageal reflux disease)    Hematoma    Hypertension    Leg pain    Low back pain    Paresthesia    Rheumatic fever    Status post mitral valve replacement    St. Jude valve   Stroke (HCC)    tia   Sweating    Vitamin D  deficiency     Past Surgical History:  Procedure Laterality Date   ABDOMINAL HYSTERECTOMY     CARDIAC VALVE REPLACEMENT  1995   Mitral valve prosthesis; st jude   CHOLECYSTECTOMY      Current Medications: Current Meds  Medication Sig   Accu-Chek Softclix Lancets lancets 1 each by Other route 2 (two) times daily. Use to check blood sugars twice a day   acetaminophen  (TYLENOL ) 500  MG tablet Take 1,000 mg by mouth every 6 (six) hours as needed for mild pain.   Alcohol Swabs (B-D SINGLE USE SWABS REGULAR) PADS 1 each by Does not apply route 2 (two) times daily. Use to clean finger to check blood sugars twice a day   Cholecalciferol (VITAMIN D3) 1000 UNIT tablet Take 1,000 Units by mouth daily.   colchicine  0.6 MG tablet Take two tablets prn gout attack.Then take another one in 1-2 hrs. Do not repeat for 3 days.   cyclobenzaprine  (FLEXERIL ) 10 MG tablet take 1/2 to 1 TABLET BY MOUTH THREE TIMES DAILY AS NEEDED FOR MUSCLE SPASMS   diltiazem  (CARTIA  XT) 240 MG 24 hr capsule Take 1 capsule (240 mg total) by mouth daily. Please keep scheduled appointment for future refills. Thank  you.   famotidine  (PEPCID ) 40 MG tablet Take 1 tablet (40 mg total) by mouth at bedtime.   furosemide  (LASIX ) 40 MG tablet Take 1 tablet (40 mg total) by mouth daily.   gabapentin  (NEURONTIN ) 100 MG capsule TAKE ONE CAPSULE BY MOUTH AT BEDTIME   glucose blood (ACCU-CHEK AVIVA PLUS) test strip TEST BLOOD SUGAR TWO TIMES DAILY   JARDIANCE  10 MG TABS tablet Take 1 tablet (10 mg total) by mouth daily.   Lancet Devices (ACCU-CHEK SOFTCLIX) lancets 1 each by Other route 2 (two) times daily. Use as instructed   loratadine  (ALLERGY RELIEF) 10 MG tablet Take 1 tablet (10 mg total) by mouth daily.   memantine  (NAMENDA ) 5 MG tablet TAKE 1 TABLET BY MOUTH TWICE  DAILY   metoprolol  succinate (TOPROL -XL) 50 MG 24 hr tablet Take 0.5 tablets (25 mg total) by mouth daily. Please keep scheduled appointment for future refills. Thank you.   mirabegron  ER (MYRBETRIQ ) 25 MG TB24 tablet Take 1 tablet (25 mg total) by mouth daily.   nitrofurantoin , macrocrystal-monohydrate, (MACROBID ) 100 MG capsule Take 1 capsule (100 mg total) by mouth 2 (two) times daily.   pantoprazole  (PROTONIX ) 40 MG tablet TAKE 1 TABLET BY MOUTH EVERY  MORNING   potassium chloride  (KLOR-CON  M) 10 MEQ tablet TAKE 1 TABLET BY MOUTH TWICE  DAILY   Semaglutide ,0.25 or 0.5MG /DOS, (OZEMPIC , 0.25 OR 0.5 MG/DOSE,) 2 MG/3ML SOPN Use 0.25 mg weekly sq for 1 month, then 0.5 mg sq weekly   vitamin B-12 (CYANOCOBALAMIN ) 1000 MCG tablet Take 1 tablet (1,000 mcg total) by mouth daily.   warfarin (COUMADIN ) 10 MG tablet TAKE 1 TABLET BY MOUTH DAILY OR  AS DIRECTED BY COUMADIN  CLINIC   warfarin (COUMADIN ) 5 MG tablet NEEDS INR APPT, CALL OFFICE [phone].TAKE 1 TABLET TWICE WEEKLY ALONG WITH 10MG  DOSE OR AS ADVISED BY COUMADIN  CLINIC     Allergies:   Aricept  [donepezil  hcl], Food, Furosemide , Metformin  and related, Quinine, Spironolactone , Tramadol , and Sulfadiazine   Social History   Socioeconomic History   Marital status: Married    Spouse name: Samual    Number of children: 1   Years of education: Not on file   Highest education level: Not on file  Occupational History   Occupation: Retired    Associate Professor: RETIRED  Tobacco Use   Smoking status: Former    Current packs/day: 0.00    Average packs/day: (1.2 ttl pk-yrs)    Types: Cigarettes    Start date: 10/28/1942    Quit date: 10/28/2002    Years since quitting: 21.8    Passive exposure: Never   Smokeless tobacco: Never   Tobacco comments:    quit x 13 years   Vaping Use  Vaping status: Never Used  Substance and Sexual Activity   Alcohol use: No    Alcohol/week: 0.0 standard drinks of alcohol   Drug use: No   Sexual activity: Yes    Partners: Male  Other Topics Concern   Not on file  Social History Narrative   Negative Family History of Colon Cancer      Regular exercise-yes, bowling      Daily caffeine Use-rare      Lives with husbands-2025   Social Drivers of Health   Financial Resource Strain: Low Risk  (11/07/2023)   Overall Financial Resource Strain (CARDIA)    Difficulty of Paying Living Expenses: Not very hard  Food Insecurity: No Food Insecurity (11/07/2023)   Hunger Vital Sign    Worried About Running Out of Food in the Last Year: Never true    Ran Out of Food in the Last Year: Never true  Transportation Needs: No Transportation Needs (11/07/2023)   PRAPARE - Administrator, Civil Service (Medical): No    Lack of Transportation (Non-Medical): No  Physical Activity: Inactive (11/07/2023)   Exercise Vital Sign    Days of Exercise per Week: 0 days    Minutes of Exercise per Session: 0 min  Stress: No Stress Concern Present (11/07/2023)   [employer] of Occupational Health - Occupational Stress Questionnaire    Feeling of Stress : Not at all  Social Connections: Socially Integrated (11/07/2023)   Social Connection and Isolation Panel    Frequency of Communication with Friends and Family: More than three times a week    Frequency of Social  Gatherings with Friends and Family: Three times a week    Attends Religious Services: More than 4 times per year    Active Member of Clubs or Organizations: Yes    Attends Banker Meetings: Never    Marital Status: Married     Family History: The patient's family history includes Diabetes in her brother and mother; Heart disease in her brother and brother; Other in her mother; Pancreatic cancer in her brother; Seizures in her mother. There is no history of Colon cancer.  ROS:   Please see the history of present illness.    + fatigue All other systems reviewed and are negative.  Labs/Other Studies Reviewed:    The following studies were reviewed today:  Echo 02/10/21  Left Ventricle: Left ventricular ejection fraction, by estimation, is  55%%. The left ventricle has low normal function. The left ventricle has  no regional wall motion abnormalities. Definity  contrast agent was given  IV to delineate the left ventricular  endocardial borders. The left ventricular internal cavity size was normal  in size. There is mild left ventricular hypertrophy. Left ventricular  diastolic parameters are indeterminate.  Right Ventricle: The right ventricular size is normal. Right vetricular  wall thickness was not assessed. Right ventricular systolic function is  low normal. There is moderately elevated pulmonary artery systolic  pressure. The tricuspid regurgitant  velocity is 3.07 m/s, and with an assumed right atrial pressure of 15  mmHg, the estimated right ventricular systolic pressure is 52.7 mmHg.  Left Atrium: Left atrial size was severely dilated.  Right Atrium: Right atrial size was severely dilated.  Pericardium: Trivial pericardial effusion is present.  Mitral Valve: MV prosthesis (St. Jude mechanical) is well seated, appears  to open well. mean gradient through the valve is approximately 4 mm Hg.  The mitral valve has been repaired/replaced. Trivial mitral valve  regurgitation. There is a St. Jude  mechanical valve present in the mitral position. The mean mitral valve  gradient is 4.0 mmHg.  Tricuspid Valve: TR is eccentric, directed toward the interatrial septum.  The tricuspid valve is normal in structure. Tricuspid valve regurgitation  is moderate to severe.  Aortic Valve: The aortic valve is tricuspid. Aortic valve regurgitation is  not visualized. Mild to moderate aortic valve sclerosis/calcification is  present, without any evidence of aortic stenosis. Aortic valve mean  gradient measures 5.4 mmHg. Aortic  valve peak gradient measures 9.4 mmHg. Aortic valve area, by VTI measures  1.38 cm.  Pulmonic Valve: The pulmonic valve was normal in structure. Pulmonic valve  regurgitation is not visualized.  Aorta: The aortic root is normal in size and structure and aortic  dilatation noted. There is mild dilatation of the ascending aorta,  measuring 40 mm.  Venous: The inferior vena cava is dilated in size with less than 50%  respiratory variability, suggesting right atrial pressure of 15 mmHg.  IAS/Shunts: No atrial level shunt detected by color flow Doppler.   Cardiac monitor 11/2020  Frequent episodes of atrial flutter PVCls occasional Does have episodes of conversion    Myocardial Perfusion Imaging 12/2012 Normal perfusion, low risk   Recent Labs: 06/02/2024: ALT 10; BUN 18; Creatinine, Ser 1.18; Hemoglobin 11.7; Platelets 193.0; Potassium 3.9; Sodium 140; TSH 2.48  Recent Lipid Panel    Component Value Date/Time   CHOL 104 03/01/2024 1204   CHOL 116 01/22/2022 1120   TRIG 77.0 03/01/2024 1204   HDL 43.00 03/01/2024 1204   HDL 47 01/22/2022 1120   CHOLHDL 2 03/01/2024 1204   VLDL 15.4 03/01/2024 1204   LDLCALC 46 03/01/2024 1204   LDLCALC 51 01/22/2022 1120     Risk Assessment/Calculations:    CHA2DS2-VASc Score =    {This indicates a  % annual risk of stroke. The patient's score is based upon:      Physical Exam:     VS:  BP 115/72 (BP Location: Right Arm, Patient Position: Sitting, Cuff Size: Normal)   Pulse (!) 51   Ht 5' 2.4 (1.585 m)   Wt 195 lb (88.5 kg)   LMP  (LMP Unknown)   SpO2 94%   BMI 35.21 kg/m     Wt Readings from Last 3 Encounters:  08/20/24 195 lb (88.5 kg)  06/02/24 197 lb 12.8 oz (89.7 kg)  03/01/24 197 lb (89.4 kg)     Affect appropriate Overweight black female  HEENT: normal Neck supple with no adenopathy JVP normal no bruits no thyromegaly Lungs  rhonchi and expitory wheezing  Heart:  S1/ click S2 no murmur, no rub, gallop or click PMI normal Abdomen: benighn, BS positve, no tenderness, no AAA no bruit.  No HSM or HJR Distal pulses intact with no bruits Trace  edema with varicosities  Neuro non-focal Gout with swelling/erythema right > toe    EKG:   05/14/21 afib rate 112 nonspecific ST changes  08/20/2024 afib rate 90 nonspecific ST changes   Diagnoses:    1. Personal history of surgery to heart and great vessels, presenting hazards to health      Assessment and Plan:     Mitral valve disese, rheumatic/Hx MVR on chronic anticoagulation: Mitral valve replaced 1995.  Normal valve click without signs and symptoms of worsening valve function. Normal valve function by echo 01/2021. Management of coumadin  by our coumadin  clinic. Update echo  Dyspnea: LVEF 55%, no rwma, mild LVH, indeterminate diastolic  parameters by echo 01/2021. Lasix  dose increased BNP was normal 11/15/21 Myovue done 01/31/22 no ischemia stable See above update TTE CXR NAD 05/2024  OSA: re established with Dr Shlomo severe OSA compliance with mask discussed   Permanent atrial fibrillation: Severe bi-atrial enlargement by echo 01/2021. Dig toxicity in past beta blocker decreased by primary 04/09/22 for low pulse  She will take Cardizem  in am and Toprol  at lunch   Aortic atherosclerosis/Hyperlipidemia LDL goal < 70: LDL   51 on current statin Rx   Fatigue: related to age, afib and poor compliance  with CPAP   DM:  metformin  d/c due to stomach upset A1c 8.2 f/u Dr Rodman Started on Jardiance  A1c elevated 7.5 06/02/24   GERD:  not much better off Metformin  Creon  did not help PRN Imodium Continue Protonix  and pepcid    Urinary Incontinence:  continue Myrbetriq   Gout:  left great toe f/u Plotnicov post Rx with colchicine  Uric acid elevated 7.5 f/u primary consider allopurinol    TTE  Disposition: F/U in  a year      Signed, Maude Emmer, MD  08/20/2024 9:09 AM    [facility]

## 2024-08-20 ENCOUNTER — Ambulatory Visit: Attending: Cardiovascular Disease | Admitting: Pharmacist

## 2024-08-20 ENCOUNTER — Encounter: Payer: Self-pay | Admitting: Cardiovascular Disease

## 2024-08-20 ENCOUNTER — Ambulatory Visit: Attending: Cardiovascular Disease | Admitting: Cardiovascular Disease

## 2024-08-20 VITALS — BP 115/72 | HR 51 | Ht 62.4 in | Wt 195.0 lb

## 2024-08-20 DIAGNOSIS — G459 Transient cerebral ischemic attack, unspecified: Secondary | ICD-10-CM | POA: Diagnosis not present

## 2024-08-20 DIAGNOSIS — Z7901 Long term (current) use of anticoagulants: Secondary | ICD-10-CM

## 2024-08-20 DIAGNOSIS — R600 Localized edema: Secondary | ICD-10-CM

## 2024-08-20 DIAGNOSIS — Z9889 Other specified postprocedural states: Secondary | ICD-10-CM

## 2024-08-20 DIAGNOSIS — Z952 Presence of prosthetic heart valve: Secondary | ICD-10-CM

## 2024-08-20 DIAGNOSIS — I4821 Permanent atrial fibrillation: Secondary | ICD-10-CM | POA: Diagnosis not present

## 2024-08-20 LAB — POCT INR: INR: 1.5 — AB (ref 2.0–3.0)

## 2024-08-20 NOTE — Patient Instructions (Signed)
 Description   INR-1.5; Take 2 tablets today and then continue taking Warfarin 10mg  (white tablet) daily.  Recheck INR in 1 week. Stay consistent with greens 1-2 servings a week of greens.  Coumadin  Clinic [phone]

## 2024-08-20 NOTE — Patient Instructions (Signed)
 Medication Instructions:  Your physician recommends that you continue on your current medications as directed. Please refer to the Current Medication list given to you today.  *If you need a refill on your cardiac medications before your next appointment, please call your pharmacy*  Lab Work: none If you have labs (blood work) drawn today and your tests are completely normal, you will receive your results only by: MyChart Message (if you have MyChart) OR A paper copy in the mail If you have any lab test that is abnormal or we need to change your treatment, we will call you to review the results.  Testing/Procedures: none  Follow-Up: At [facility], you and your health needs are our priority.  As part of our continuing mission to provide you with exceptional heart care, our providers are all part of one team.  This team includes your primary Cardiologist (physician) and Advanced Practice Providers or APPs (Physician Assistants and Nurse Practitioners) who all work together to provide you with the care you need, when you need it.  Your next appointment:   1 year  Provider:   Maude Emmer, MD    We recommend signing up for the patient portal called MyChart.  Sign up information is provided on this After Visit Summary.  MyChart is used to connect with patients for Virtual Visits (Telemedicine).  Patients are able to view lab/test results, encounter notes, upcoming appointments, etc.  Non-urgent messages can be sent to your provider as well.   To learn more about what you can do with MyChart, go to [email or web address].   Other Instructions Echo  Your physician has requested that you have an echocardiogram. Echocardiography is a painless test that uses sound waves to create images of your heart. It provides your doctor with information about the size and shape of your heart and how well your heart's chambers and valves are working. This procedure takes approximately one hour.  There are no restrictions for this procedure. Please do NOT wear cologne, perfume, aftershave, or lotions (deodorant is allowed). Please arrive 15 minutes prior to your appointment time.  Please note: We ask at that you not bring children with you during ultrasound (echo/ vascular) testing. Due to room size and safety concerns, children are not allowed in the ultrasound rooms during exams. Our front office staff cannot provide observation of children in our lobby area while testing is being conducted. An adult accompanying a patient to their appointment will only be allowed in the ultrasound room at the discretion of the ultrasound technician under special circumstances. We apologize for any inconvenience.

## 2024-08-20 NOTE — Progress Notes (Signed)
 Description   INR-1.5; Take 2 tablets today and then continue taking Warfarin 10mg  (white tablet) daily.  Recheck INR in 1 week. Stay consistent with greens 1-2 servings a week of greens.  Coumadin  Clinic [phone]    Advised patient to take 2 tablets when she gets home. Recheck in 1 week

## 2024-08-23 ENCOUNTER — Ambulatory Visit: Admitting: Internal Medicine

## 2024-08-24 ENCOUNTER — Other Ambulatory Visit: Payer: Self-pay | Admitting: Cardiovascular Disease

## 2024-08-27 ENCOUNTER — Ambulatory Visit

## 2024-08-31 ENCOUNTER — Ambulatory Visit: Attending: Cardiovascular Disease | Admitting: *Deleted

## 2024-08-31 DIAGNOSIS — Z5181 Encounter for therapeutic drug level monitoring: Secondary | ICD-10-CM | POA: Diagnosis not present

## 2024-08-31 DIAGNOSIS — Z7901 Long term (current) use of anticoagulants: Secondary | ICD-10-CM

## 2024-08-31 DIAGNOSIS — Z9889 Other specified postprocedural states: Secondary | ICD-10-CM

## 2024-08-31 DIAGNOSIS — I4821 Permanent atrial fibrillation: Secondary | ICD-10-CM | POA: Diagnosis not present

## 2024-08-31 DIAGNOSIS — I359 Nonrheumatic aortic valve disorder, unspecified: Secondary | ICD-10-CM

## 2024-08-31 LAB — POCT INR: INR: 2.3 (ref 2.0–3.0)

## 2024-08-31 NOTE — Progress Notes (Signed)
 Description   INR-2.3; Take 15mg  (1.5 tablets) today and then continue taking Warfarin 10mg  (white tablet) daily.  Recheck INR in 4 weeks Stay consistent with greens 1-2 servings a week of greens.  Coumadin  Clinic [phone]

## 2024-08-31 NOTE — Patient Instructions (Addendum)
 Description   INR-2.3; Take 15mg  (1.5 tablets) today and then continue taking Warfarin 10mg  (white tablet) daily.  Recheck INR in 4 weeks Stay consistent with greens 1-2 servings a week of greens.  Coumadin  Clinic [phone]

## 2024-09-03 ENCOUNTER — Other Ambulatory Visit: Payer: Self-pay | Admitting: Cardiovascular Disease

## 2024-09-03 DIAGNOSIS — I4821 Permanent atrial fibrillation: Secondary | ICD-10-CM

## 2024-09-03 NOTE — Telephone Encounter (Signed)
 Warfarin 5mg  refill-pt uses 10mg  tablet per last encounter, will deny this request Aortic valve disorder & Atrial fibrillation  Last INR 08/31/24 Last OV 08/20/24

## 2024-09-15 ENCOUNTER — Other Ambulatory Visit: Payer: Self-pay | Admitting: Internal Medicine

## 2024-09-28 ENCOUNTER — Ambulatory Visit

## 2024-10-07 ENCOUNTER — Ambulatory Visit: Attending: Cardiovascular Disease | Admitting: *Deleted

## 2024-10-07 DIAGNOSIS — Z9889 Other specified postprocedural states: Secondary | ICD-10-CM | POA: Diagnosis not present

## 2024-10-07 DIAGNOSIS — Z7901 Long term (current) use of anticoagulants: Secondary | ICD-10-CM | POA: Diagnosis not present

## 2024-10-07 DIAGNOSIS — Z5181 Encounter for therapeutic drug level monitoring: Secondary | ICD-10-CM

## 2024-10-07 DIAGNOSIS — I4891 Unspecified atrial fibrillation: Secondary | ICD-10-CM

## 2024-10-07 LAB — POCT INR: POC INR: 2.4

## 2024-10-07 NOTE — Patient Instructions (Signed)
 Description   INR-2.4;  START taking warfarin 10mg  daily except for 15mg  on Thursdays.  Recheck INR in 3 weeks Stay consistent with greens 1-2 servings a week of greens.  Coumadin  Clinic [phone]

## 2024-10-07 NOTE — Progress Notes (Signed)
 Lab Results  Component Value Date   INR 2.4 10/07/2024   INR 2.3 08/31/2024   INR 1.5 (A) 08/20/2024    Description   INR-2.4;  START taking warfarin 10mg  daily except for 15mg  on Thursdays.  Recheck INR in 3 weeks Stay consistent with greens 1-2 servings a week of greens.  Coumadin  Clinic [phone]

## 2024-10-12 ENCOUNTER — Telehealth (HOSPITAL_COMMUNITY): Payer: Self-pay | Admitting: Cardiovascular Disease

## 2024-10-12 ENCOUNTER — Ambulatory Visit (HOSPITAL_COMMUNITY): Attending: Internal Medicine

## 2024-10-12 ENCOUNTER — Encounter: Payer: Self-pay | Admitting: *Deleted

## 2024-10-12 NOTE — Progress Notes (Signed)
 Beth Miller                                          MRN: [id number]   10/12/2024   The [facility]BCI Quality Team Specialist reviewed this patient medical record for the purposes of chart review for care gap closure. The following were reviewed: chart review for care gap closure-kidney health evaluation for diabetes:eGFR  and uACR.    VBCI Quality Team

## 2024-10-12 NOTE — Telephone Encounter (Signed)
 Patient was scheduled for an echocardiogram on 10/12/24. Order states that patient was not due til 07/2025.[name] Patient was not a NO SHOW. We will call patient closer to when echo is due in October 2026.Thank you.

## 2024-11-01 ENCOUNTER — Ambulatory Visit: Attending: Cardiovascular Disease

## 2024-11-09 ENCOUNTER — Ambulatory Visit: Payer: Medicare HMO

## 2024-11-09 VITALS — Ht 62.0 in | Wt 198.0 lb

## 2024-11-09 DIAGNOSIS — Z0000 Encounter for general adult medical examination without abnormal findings: Secondary | ICD-10-CM | POA: Diagnosis not present

## 2024-11-09 DIAGNOSIS — Z780 Asymptomatic menopausal state: Secondary | ICD-10-CM | POA: Diagnosis not present

## 2024-11-09 NOTE — Patient Instructions (Addendum)
 Beth Miller,  Thank you for taking the time for your Medicare Wellness Visit. I appreciate your continued commitment to your health goals. Please review the care plan we discussed, and feel free to reach out if I can assist you further.  Please note that Annual Wellness Visits do not include a physical exam. Some assessments may be limited, especially if the visit was conducted virtually. If needed, we may recommend an in-person follow-up with your provider.  Ongoing Care Seeing your primary care provider every 3 to 6 months helps us  monitor your health and provide consistent, personalized care.   Referrals If a referral was made during today's visit and you haven't received any updates within two weeks, please contact the referred provider directly to check on the status.  Recommended Screenings:  Health Maintenance  Topic Date Due   Complete foot exam   Never done   Yearly kidney health urinalysis for diabetes  Never done   Osteoporosis screening with Bone Density Scan  Never done   Eye exam for diabetics  11/15/2023   Flu Shot  05/28/2024   Hemoglobin A1C  12/03/2024   Yearly kidney function blood test for diabetes  06/02/2025   Medicare Annual Wellness Visit  11/09/2025   DTaP/Tdap/Td vaccine (2 - Td or Tdap) 05/08/2026   Pneumococcal Vaccine for age over 63  Completed   Meningitis B Vaccine  Aged Out   COVID-19 Vaccine  Discontinued   Zoster (Shingles) Vaccine  Discontinued       11/09/2024    8:55 AM  Advanced Directives  Does Patient Have a Medical Advance Directive? No  Would patient like information on creating a medical advance directive? No - Patient declined    Vision: Annual vision screenings are recommended for early detection of glaucoma, cataracts, and diabetic retinopathy. These exams can also reveal signs of chronic conditions such as diabetes and high blood pressure.  Dental: Annual dental screenings help detect early signs of oral cancer, gum disease, and  other conditions linked to overall health, including heart disease and diabetes.

## 2024-11-09 NOTE — Progress Notes (Addendum)
 "  Chief Complaint  Patient presents with   Medicare Wellness     Subjective:   Beth Miller is a 84 y.o. female who presents for a Medicare Annual Wellness Visit.  Visit info / Clinical Intake: Medicare Wellness Visit Type:: Subsequent Annual Wellness Visit Persons participating in visit and providing information:: patient Medicare Wellness Visit Mode:: Telephone If telephone:: video declined Since this visit was completed virtually, some vitals may be partially provided or unavailable. Missing vitals are due to the limitations of the virtual format.: Documented vitals are patient reported If Telephone or Video please confirm:: I connected with patient using audio/video enable telemedicine. I verified patient identity with two identifiers, discussed telehealth limitations, and patient agreed to proceed. Patient Location:: Home Provider Location:: Office Interpreter Needed?: No Pre-visit prep was completed: yes AWV questionnaire completed by patient prior to visit?: no Patient's Overall Health Status Rating: (!) fair Typical amount of pain: none Does pain affect daily life?: no Are you currently prescribed opioids?: no  Dietary Habits and Nutritional Risks How many meals a day?: 2 Eats fruit and vegetables daily?: yes Most meals are obtained by: preparing own meals; eating out In the last 2 weeks, have you had any of the following?: none Diabetic:: (!) yes Any non-healing wounds?: no How often do you check your BS?: 1; as needed Would you like to be referred to a Nutritionist or for Diabetic Management? : no  Functional Status Activities of Daily Living (to include ambulation/medication): Independent  Fall Screening Falls in the past year?: 0 Number of falls in past year: 0 Was there an injury with Fall?: 0 Fall Risk Category Calculator: 0 Patient Fall Risk Level: Low Fall Risk  Fall Risk Patient at Risk for Falls Due to: No Fall Risks Fall risk Follow up: Falls  evaluation completed; Falls prevention discussed  Home and Transportation Safety: All rugs have non-skid backing?: N/A, no rugs All stairs or steps have railings?: yes (outside) Grab bars in the bathtub or shower?: yes Have non-skid surface in bathtub or shower?: yes Good home lighting?: yes Regular seat belt use?: yes Hospital stays in the last year:: no  Cognitive Assessment Difficulty concentrating, remembering, or making decisions? : no Will 6CIT or Mini Cog be Completed: yes What year is it?: 0 points What month is it?: 0 points Give patient an address phrase to remember (5 components): [address] [place], [place] About what time is it?: 0 points Count backwards from 20 to 1: 0 points Say the months of the year in reverse: 0 points Repeat the address phrase from earlier: 0 points 6 CIT Score: 0 points  Advance Directives (For Healthcare) Does Patient Have a Medical Advance Directive?: No Would patient like information on creating a medical advance directive?: No - Patient declined  Reviewed/Updated  Reviewed/Updated: Reviewed All (Medical, Surgical, Family, Medications, Allergies, Care Teams, Patient Goals)    Allergies (verified) Aricept  [donepezil  hcl], Food, Furosemide , Metformin  and related, Quinine, Spironolactone , Tramadol , and Sulfadiazine   Current Medications (verified) Outpatient Encounter Medications as of 11/09/2024  Medication Sig   Accu-Chek Softclix Lancets lancets 1 each by Other route 2 (two) times daily. Use to check blood sugars twice a day   acetaminophen  (TYLENOL ) 500 MG tablet Take 1,000 mg by mouth every 6 (six) hours as needed for mild pain.   Alcohol Swabs (B-D SINGLE USE SWABS REGULAR) PADS 1 each by Does not apply route 2 (two) times daily. Use to clean finger to check blood sugars twice a  day   Cholecalciferol (VITAMIN D3) 1000 UNIT tablet Take 1,000 Units by mouth daily.   colchicine  0.6 MG tablet Take two tablets prn gout attack.Then take  another one in 1-2 hrs. Do not repeat for 3 days.   cyclobenzaprine  (FLEXERIL ) 10 MG tablet take 1/2 to 1 TABLET BY MOUTH THREE TIMES DAILY AS NEEDED FOR MUSCLE SPASMS   diltiazem  (CARTIA  XT) 240 MG 24 hr capsule Take 1 capsule (240 mg total) by mouth daily.   famotidine  (PEPCID ) 40 MG tablet Take 1 tablet (40 mg total) by mouth at bedtime.   furosemide  (LASIX ) 40 MG tablet Take 1 tablet (40 mg total) by mouth daily.   gabapentin  (NEURONTIN ) 100 MG capsule TAKE ONE CAPSULE BY MOUTH AT BEDTIME   glucose blood (ACCU-CHEK AVIVA PLUS) test strip TEST BLOOD SUGAR TWO TIMES DAILY   JARDIANCE  10 MG TABS tablet Take 1 tablet (10 mg total) by mouth daily.   Lancet Devices (ACCU-CHEK SOFTCLIX) lancets 1 each by Other route 2 (two) times daily. Use as instructed   loratadine  (ALLERGY RELIEF) 10 MG tablet Take 1 tablet (10 mg total) by mouth daily.   memantine  (NAMENDA ) 5 MG tablet TAKE ONE TABLET BY MOUTH BY MOUTH TWICE DAILY   metoprolol  succinate (TOPROL -XL) 50 MG 24 hr tablet TAKE ONE-HALF TABLET BY MOUTH  DAILY   nitrofurantoin , macrocrystal-monohydrate, (MACROBID ) 100 MG capsule Take 1 capsule (100 mg total) by mouth 2 (two) times daily.   pantoprazole  (PROTONIX ) 40 MG tablet TAKE 1 TABLET BY MOUTH EVERY  MORNING   potassium chloride  (KLOR-CON  M) 10 MEQ tablet TAKE 1 TABLET BY MOUTH TWICE  DAILY   vitamin B-12 (CYANOCOBALAMIN ) 1000 MCG tablet Take 1 tablet (1,000 mcg total) by mouth daily.   warfarin (COUMADIN ) 10 MG tablet TAKE 1 TABLET BY MOUTH DAILY OR  AS DIRECTED BY COUMADIN  CLINIC   warfarin (COUMADIN ) 5 MG tablet NEEDS INR APPT, CALL OFFICE [phone].TAKE 1 TABLET TWICE WEEKLY ALONG WITH 10MG  DOSE OR AS ADVISED BY COUMADIN  CLINIC   mirabegron  ER (MYRBETRIQ ) 25 MG TB24 tablet Take 1 tablet (25 mg total) by mouth daily. (Patient not taking: Reported on 11/09/2024)   Semaglutide ,0.25 or 0.5MG /DOS, (OZEMPIC , 0.25 OR 0.5 MG/DOSE,) 2 MG/3ML SOPN Use 0.25 mg weekly sq for 1 month, then 0.5 mg sq  weekly (Patient not taking: Reported on 11/09/2024)   No facility-administered encounter medications on file as of 11/09/2024.    History: Past Medical History:  Diagnosis Date   AF (atrial fibrillation) (HCC)    ALLERGIC RHINITIS    Blood transfusion without reported diagnosis    Cataract, senile    Chest pain    CHF (congestive heart failure) (HCC)    Chronic anticoagulation    Diabetes mellitus    Diabetes mellitus    Disorder of oral soft tissue    of mouth   Diverticulosis    GERD (gastroesophageal reflux disease)    Hematoma    Hypertension    Leg pain    Low back pain    Paresthesia    Rheumatic fever    Status post mitral valve replacement    St. Jude valve   Stroke (HCC)    tia   Sweating    Vitamin D  deficiency    Past Surgical History:  Procedure Laterality Date   ABDOMINAL HYSTERECTOMY     CARDIAC VALVE REPLACEMENT  1995   Mitral valve prosthesis; st jude   CHOLECYSTECTOMY     Family History  Problem Relation Age of  Onset   Diabetes Mother    Seizures Mother    Other Mother        brain tumor   Heart disease Brother    Diabetes Brother    Heart disease Brother    Pancreatic cancer Brother    Colon cancer Neg Hx    Social History   Occupational History   Occupation: Retired    Associate Professor: RETIRED  Tobacco Use   Smoking status: Former    Current packs/day: 0.00    Average packs/day: (1.2 ttl pk-yrs)    Types: Cigarettes    Start date: 10/28/1942    Quit date: 10/28/2002    Years since quitting: 22.0    Passive exposure: Never   Smokeless tobacco: Never   Tobacco comments:    quit x 13 years   Vaping Use   Vaping status: Never Used  Substance and Sexual Activity   Alcohol use: No   Drug use: No   Sexual activity: Yes    Partners: Male   Tobacco Counseling Counseling given: Yes Tobacco comments: quit x 13 years   SDOH Screenings   Food Insecurity: No Food Insecurity (11/09/2024)  Housing: Unknown (11/09/2024)  Transportation Needs:  No Transportation Needs (11/09/2024)  Utilities: Not At Risk (11/09/2024)  Alcohol Screen: Low Risk (11/07/2023)  Depression (PHQ2-9): Low Risk (11/09/2024)  Financial Resource Strain: Low Risk (11/07/2023)  Physical Activity: Inactive (11/09/2024)  Social Connections: Socially Integrated (11/09/2024)  Stress: No Stress Concern Present (11/09/2024)  Tobacco Use: Medium Risk (11/09/2024)  Health Literacy: Adequate Health Literacy (11/09/2024)   See flowsheets for full screening details  Depression Screen PHQ 2 & 9 Depression Scale- Over the past 2 weeks, how often have you been bothered by any of the following problems? Little interest or pleasure in doing things: 0 Feeling down, depressed, or hopeless (PHQ Adolescent also includes...irritable): 0 PHQ-2 Total Score: 0 Trouble falling or staying asleep, or sleeping too much: 0 Feeling tired or having little energy: 0 Poor appetite or overeating (PHQ Adolescent also includes...weight loss): 0 Feeling bad about yourself - or that you are a failure or have let yourself or your family down: 0 Trouble concentrating on things, such as reading the newspaper or watching television (PHQ Adolescent also includes...like school work): 0 Moving or speaking so slowly that other people could have noticed. Or the opposite - being so fidgety or restless that you have been moving around a lot more than usual: 0 Thoughts that you would be better off dead, or of hurting yourself in some way: 0 PHQ-9 Total Score: 0 If you checked off any problems, how difficult have these problems made it for you to do your work, take care of things at home, or get along with other people?: Not difficult at all  Depression Treatment Depression Interventions/Treatment : EYV7-0 Score <4 Follow-up Not Indicated     Goals Addressed               This Visit's Progress     Patient Stated (pt-stated)        Patient stated she plans to continue managing her sugar intake and lose  weight - about 35-40lbs             Objective:    Today's Vitals   11/09/24 0851  Weight: 198 lb (89.8 kg)  Height: 5' 2 (1.575 m)   Body mass index is 36.21 kg/m.  Hearing/Vision screen Hearing Screening - Comments:: Wears hearing aids Vision Screening - Comments:: Wears rx  glasses - up to date with routine eye exams with [facility] Immunizations and Health Maintenance Health Maintenance  Topic Date Due   FOOT EXAM  Never done   Diabetic kidney evaluation - Urine ACR  Never done   Bone Density Scan  Never done   OPHTHALMOLOGY EXAM  11/15/2023   Influenza Vaccine  05/28/2024   HEMOGLOBIN A1C  12/03/2024   Diabetic kidney evaluation - eGFR measurement  06/02/2025   Medicare Annual Wellness (AWV)  11/09/2025   DTaP/Tdap/Td (2 - Td or Tdap) 05/08/2026   Pneumococcal Vaccine: 50+ Years  Completed   Meningococcal B Vaccine  Aged Out   COVID-19 Vaccine  Discontinued   Zoster Vaccines- Shingrix  Discontinued        Assessment/Plan:  This is a routine wellness examination for Lilith.  DEXA Scan status: ordered today  Pharmacy referral>referral to [facility] for medication cost assistance  Patient Care Team: Plotnikov, Karlynn GAILS, MD as PCP - General Delford Maude BROCKS, MD as PCP - Cardiology (Cardiology) Delford Maude BROCKS, MD (Cardiology) Layman Aida LABOR, MD (Obstetrics and Gynecology) Nche, Roselie Rockford, NP as Nurse Practitioner (Internal Medicine) Eda Iha, MD (Inactive) as Consulting Physician (Gastroenterology) Szabat, Toribio BROCKS, [facility] (Inactive) as Pharmacist (Pharmacist) Camillo Golas, MD as Attending Physician (Ophthalmology)  I have personally reviewed and noted the following in the patients chart:   Medical and social history Use of alcohol, tobacco or illicit drugs  Current medications and supplements including opioid prescriptions. Functional ability and status Nutritional status Physical activity Advanced directives List of other  physicians Hospitalizations, surgeries, and ER visits in previous 12 months Vitals Screenings to include cognitive, depression, and falls Referrals and appointments  Orders Placed This Encounter  Procedures   DG Bone Density    Standing Status:   Future    Expiration Date:   11/09/2025    Reason for Exam (SYMPTOM  OR DIAGNOSIS REQUIRED):   post menopausal deficiency    Preferred imaging location?:   [address]   In addition, I have reviewed and discussed with patient certain preventive protocols, quality metrics, and best practice recommendations. A written personalized care plan for preventive services as well as general preventive health recommendations were provided to patient.   Verdie CHRISTELLA Saba, CMA   11/09/2024   Return in 1 year (on 11/09/2025).  After Visit Summary: (MyChart) Due to this being a telephonic visit, the after visit summary with patients personalized plan was offered to patient via MyChart   Nurse Notes: referral to [facility] for medication cost assistance; scheduled a 6-mth Diabetes f/u appt w/PCP for 11/2024; scheduled 2027 AWV appt.  Medical screening examination/treatment/procedure(s) were performed by non-physician practitioner and as supervising physician I was immediately available for consultation/collaboration.  I agree with above. Karlynn Noel, MD "

## 2024-11-10 ENCOUNTER — Other Ambulatory Visit: Payer: Self-pay | Admitting: Family

## 2024-12-08 ENCOUNTER — Ambulatory Visit: Admitting: Internal Medicine

## 2025-11-11 ENCOUNTER — Ambulatory Visit

## 2025-12-09 ENCOUNTER — Ambulatory Visit
